# Patient Record
Sex: Female | Born: 1985 | Race: White | Hispanic: No | Marital: Single | Smoking: Current every day smoker
Health system: Southern US, Community
[De-identification: ages and names within clinical notes are randomized; demographics above are authoritative.]

## PROBLEM LIST (undated history)

## (undated) DIAGNOSIS — I38 Endocarditis, valve unspecified: Secondary | ICD-10-CM

## (undated) DIAGNOSIS — J45909 Unspecified asthma, uncomplicated: Secondary | ICD-10-CM

## (undated) DIAGNOSIS — F1110 Opioid abuse, uncomplicated: Secondary | ICD-10-CM

## (undated) DIAGNOSIS — B952 Enterococcus as the cause of diseases classified elsewhere: Secondary | ICD-10-CM

## (undated) DIAGNOSIS — J189 Pneumonia, unspecified organism: Secondary | ICD-10-CM

## (undated) DIAGNOSIS — F1910 Other psychoactive substance abuse, uncomplicated: Secondary | ICD-10-CM

## (undated) DIAGNOSIS — R7881 Bacteremia: Secondary | ICD-10-CM

## (undated) DIAGNOSIS — E871 Hypo-osmolality and hyponatremia: Secondary | ICD-10-CM

## (undated) DIAGNOSIS — D649 Anemia, unspecified: Secondary | ICD-10-CM

## (undated) HISTORY — PX: NO PAST SURGERIES: SHX2092

---
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
## 2007-07-17 ENCOUNTER — Emergency Department (HOSPITAL_COMMUNITY): Admission: EM | Admit: 2007-07-17 | Discharge: 2007-07-17 | Payer: Self-pay | Admitting: Family Medicine

## 2010-03-10 ENCOUNTER — Inpatient Hospital Stay (HOSPITAL_COMMUNITY)
Admission: AD | Admit: 2010-03-10 | Discharge: 2010-03-10 | Payer: Self-pay | Source: Home / Self Care | Admitting: Obstetrics and Gynecology

## 2010-04-03 NOTE — L&D Delivery Note (Signed)
Delivery Note At  a viable  female was delivered via  (Presentation: LOA  ).  Cord was clamped and cut and infant was placed on mother's abdomen.  Cord blood was sampled. APGAR:9/9 , ; weight 6lb 14oz .   Placenta status: intact, .  Good uterine firming with fundal massage and pitocin.  Cord:  with the following complications: .  Cord pH: pending  Anesthesia:  epidural Episiotomy: none Lacerations: 2nd degree posterior Suture Repair: 3.0 monocryl Est. Blood Loss (mL):  Mom to postpartum.  Baby to nursery-stable.  Lindaann Slough MD 11/16/2010, 3:42 AM

## 2010-05-02 ENCOUNTER — Other Ambulatory Visit (HOSPITAL_COMMUNITY): Admission: RE | Admit: 2010-05-02 | Payer: Self-pay | Source: Ambulatory Visit | Admitting: Obstetrics & Gynecology

## 2010-05-02 ENCOUNTER — Ambulatory Visit
Admission: RE | Admit: 2010-05-02 | Discharge: 2010-05-02 | Payer: Self-pay | Source: Home / Self Care | Attending: Obstetrics and Gynecology | Admitting: Obstetrics and Gynecology

## 2010-05-02 ENCOUNTER — Other Ambulatory Visit (HOSPITAL_COMMUNITY)
Admission: RE | Admit: 2010-05-02 | Discharge: 2010-05-02 | Disposition: A | Discharge status: Home / Self Care | Payer: Medicaid Other | Source: Ambulatory Visit | Attending: Obstetrics & Gynecology | Admitting: Obstetrics & Gynecology

## 2010-05-02 DIAGNOSIS — Z113 Encounter for screening for infections with a predominantly sexual mode of transmission: Secondary | ICD-10-CM | POA: Insufficient documentation

## 2010-05-02 DIAGNOSIS — Z01419 Encounter for gynecological examination (general) (routine) without abnormal findings: Secondary | ICD-10-CM | POA: Insufficient documentation

## 2010-05-03 ENCOUNTER — Encounter: Payer: Self-pay | Admitting: Family Medicine

## 2010-05-03 ENCOUNTER — Encounter: Payer: Self-pay | Admitting: Obstetrics and Gynecology

## 2010-05-03 LAB — CONVERTED CEMR LAB
Basophils Absolute: 0 10*3/uL (ref 0.0–0.1)
Eosinophils Absolute: 0.1 10*3/uL (ref 0.0–0.7)
Eosinophils Relative: 1 % (ref 0–5)
HCT: 35.8 % — ABNORMAL LOW (ref 36.0–46.0)
Hepatitis B Surface Ag: NEGATIVE
Lymphocytes Relative: 25 % (ref 12–46)
Lymphs Abs: 2.1 10*3/uL (ref 0.7–4.0)
Neutrophils Relative %: 67 % (ref 43–77)
Platelets: 258 10*3/uL (ref 150–400)
RDW: 14.3 % (ref 11.5–15.5)
Rh Type: POSITIVE
Trich, Wet Prep: NONE SEEN
WBC: 8.3 10*3/uL (ref 4.0–10.5)
Yeast Wet Prep HPF POC: NONE SEEN

## 2010-05-31 ENCOUNTER — Encounter: Payer: Self-pay | Admitting: Obstetrics and Gynecology

## 2010-05-31 DIAGNOSIS — Z3400 Encounter for supervision of normal first pregnancy, unspecified trimester: Secondary | ICD-10-CM

## 2010-06-01 ENCOUNTER — Other Ambulatory Visit: Payer: Self-pay | Admitting: Obstetrics & Gynecology

## 2010-06-01 DIAGNOSIS — Z3689 Encounter for other specified antenatal screening: Secondary | ICD-10-CM

## 2010-06-14 LAB — URINALYSIS, ROUTINE W REFLEX MICROSCOPIC
Glucose, UA: NEGATIVE mg/dL
Hgb urine dipstick: NEGATIVE
pH: 6 (ref 5.0–8.0)

## 2010-06-14 LAB — WET PREP, GENITAL: Trich, Wet Prep: NONE SEEN

## 2010-06-14 LAB — CBC
MCH: 31.6 pg (ref 26.0–34.0)
MCHC: 34.3 g/dL (ref 30.0–36.0)
Platelets: 209 10*3/uL (ref 150–400)
RBC: 4.82 MIL/uL (ref 3.87–5.11)
RDW: 14.1 % (ref 11.5–15.5)

## 2010-06-14 LAB — HCG, QUANTITATIVE, PREGNANCY: hCG, Beta Chain, Quant, S: 11637 m[IU]/mL — ABNORMAL HIGH (ref <5)

## 2010-06-14 LAB — POCT PREGNANCY, URINE: Preg Test, Ur: POSITIVE

## 2010-06-15 ENCOUNTER — Ambulatory Visit (HOSPITAL_COMMUNITY)
Admission: RE | Admit: 2010-06-15 | Discharge: 2010-06-15 | Disposition: A | Discharge status: Home / Self Care | Payer: Medicaid Other | Source: Ambulatory Visit | Attending: Obstetrics & Gynecology | Admitting: Obstetrics & Gynecology

## 2010-06-15 DIAGNOSIS — Z3689 Encounter for other specified antenatal screening: Secondary | ICD-10-CM

## 2010-06-15 DIAGNOSIS — O99330 Smoking (tobacco) complicating pregnancy, unspecified trimester: Secondary | ICD-10-CM | POA: Insufficient documentation

## 2010-06-15 DIAGNOSIS — O358XX0 Maternal care for other (suspected) fetal abnormality and damage, not applicable or unspecified: Secondary | ICD-10-CM | POA: Insufficient documentation

## 2010-06-15 DIAGNOSIS — Z1389 Encounter for screening for other disorder: Secondary | ICD-10-CM | POA: Insufficient documentation

## 2010-06-15 DIAGNOSIS — Z363 Encounter for antenatal screening for malformations: Secondary | ICD-10-CM | POA: Insufficient documentation

## 2010-06-29 ENCOUNTER — Encounter: Payer: Self-pay | Admitting: Obstetrics & Gynecology

## 2010-07-19 ENCOUNTER — Other Ambulatory Visit: Payer: Self-pay | Admitting: Obstetrics and Gynecology

## 2010-07-19 ENCOUNTER — Encounter: Payer: Medicaid Other | Admitting: Obstetrics and Gynecology

## 2010-07-19 DIAGNOSIS — Z09 Encounter for follow-up examination after completed treatment for conditions other than malignant neoplasm: Secondary | ICD-10-CM

## 2010-07-19 DIAGNOSIS — O99330 Smoking (tobacco) complicating pregnancy, unspecified trimester: Secondary | ICD-10-CM

## 2010-07-19 DIAGNOSIS — Z3480 Encounter for supervision of other normal pregnancy, unspecified trimester: Secondary | ICD-10-CM

## 2010-07-22 ENCOUNTER — Ambulatory Visit (HOSPITAL_COMMUNITY): Payer: Medicaid Other

## 2010-07-25 ENCOUNTER — Ambulatory Visit (HOSPITAL_COMMUNITY)
Admission: RE | Admit: 2010-07-25 | Discharge: 2010-07-25 | Disposition: A | Discharge status: Home / Self Care | Payer: Medicaid Other | Source: Ambulatory Visit | Attending: Obstetrics and Gynecology | Admitting: Obstetrics and Gynecology

## 2010-07-25 DIAGNOSIS — O358XX0 Maternal care for other (suspected) fetal abnormality and damage, not applicable or unspecified: Secondary | ICD-10-CM | POA: Insufficient documentation

## 2010-07-25 DIAGNOSIS — Z09 Encounter for follow-up examination after completed treatment for conditions other than malignant neoplasm: Secondary | ICD-10-CM

## 2010-07-25 DIAGNOSIS — Z363 Encounter for antenatal screening for malformations: Secondary | ICD-10-CM | POA: Insufficient documentation

## 2010-07-25 DIAGNOSIS — Z1389 Encounter for screening for other disorder: Secondary | ICD-10-CM | POA: Insufficient documentation

## 2010-08-16 ENCOUNTER — Encounter (INDEPENDENT_AMBULATORY_CARE_PROVIDER_SITE_OTHER): Payer: Medicaid Other | Admitting: Obstetrics & Gynecology

## 2010-08-16 DIAGNOSIS — O99330 Smoking (tobacco) complicating pregnancy, unspecified trimester: Secondary | ICD-10-CM

## 2010-08-16 DIAGNOSIS — Z3400 Encounter for supervision of normal first pregnancy, unspecified trimester: Secondary | ICD-10-CM

## 2010-08-17 ENCOUNTER — Encounter (INDEPENDENT_AMBULATORY_CARE_PROVIDER_SITE_OTHER): Payer: Medicaid Other | Admitting: Obstetrics & Gynecology

## 2010-08-17 DIAGNOSIS — Z3480 Encounter for supervision of other normal pregnancy, unspecified trimester: Secondary | ICD-10-CM

## 2010-09-06 ENCOUNTER — Other Ambulatory Visit: Payer: Self-pay | Admitting: Family Medicine

## 2010-09-06 ENCOUNTER — Encounter (INDEPENDENT_AMBULATORY_CARE_PROVIDER_SITE_OTHER): Payer: Medicaid Other | Admitting: Family Medicine

## 2010-09-06 DIAGNOSIS — Z3400 Encounter for supervision of normal first pregnancy, unspecified trimester: Secondary | ICD-10-CM

## 2010-09-16 ENCOUNTER — Ambulatory Visit (HOSPITAL_COMMUNITY)
Admission: RE | Admit: 2010-09-16 | Discharge: 2010-09-16 | Disposition: A | Discharge status: Home / Self Care | Payer: Medicaid Other | Source: Ambulatory Visit | Attending: Family Medicine | Admitting: Family Medicine

## 2010-09-16 DIAGNOSIS — O365990 Maternal care for other known or suspected poor fetal growth, unspecified trimester, not applicable or unspecified: Secondary | ICD-10-CM | POA: Insufficient documentation

## 2010-09-16 DIAGNOSIS — O99330 Smoking (tobacco) complicating pregnancy, unspecified trimester: Secondary | ICD-10-CM | POA: Insufficient documentation

## 2010-09-20 ENCOUNTER — Encounter (INDEPENDENT_AMBULATORY_CARE_PROVIDER_SITE_OTHER): Payer: Medicaid Other | Admitting: Obstetrics & Gynecology

## 2010-09-20 DIAGNOSIS — O99330 Smoking (tobacco) complicating pregnancy, unspecified trimester: Secondary | ICD-10-CM

## 2010-09-20 DIAGNOSIS — Z3480 Encounter for supervision of other normal pregnancy, unspecified trimester: Secondary | ICD-10-CM

## 2010-10-04 ENCOUNTER — Encounter: Payer: Medicaid Other | Admitting: Obstetrics & Gynecology

## 2010-10-04 ENCOUNTER — Other Ambulatory Visit: Payer: Self-pay | Admitting: Obstetrics & Gynecology

## 2010-10-04 ENCOUNTER — Encounter (INDEPENDENT_AMBULATORY_CARE_PROVIDER_SITE_OTHER): Payer: Medicaid Other | Admitting: Obstetrics & Gynecology

## 2010-10-04 DIAGNOSIS — Z3400 Encounter for supervision of normal first pregnancy, unspecified trimester: Secondary | ICD-10-CM

## 2010-10-10 ENCOUNTER — Ambulatory Visit (HOSPITAL_COMMUNITY)
Admission: RE | Admit: 2010-10-10 | Discharge: 2010-10-10 | Disposition: A | Discharge status: Home / Self Care | Payer: Medicaid Other | Source: Ambulatory Visit | Attending: Obstetrics & Gynecology | Admitting: Obstetrics & Gynecology

## 2010-10-10 DIAGNOSIS — O365990 Maternal care for other known or suspected poor fetal growth, unspecified trimester, not applicable or unspecified: Secondary | ICD-10-CM | POA: Insufficient documentation

## 2010-10-10 DIAGNOSIS — O99330 Smoking (tobacco) complicating pregnancy, unspecified trimester: Secondary | ICD-10-CM | POA: Insufficient documentation

## 2010-10-13 ENCOUNTER — Encounter: Payer: Medicaid Other | Admitting: Obstetrics & Gynecology

## 2010-10-18 ENCOUNTER — Encounter (INDEPENDENT_AMBULATORY_CARE_PROVIDER_SITE_OTHER): Payer: Medicaid Other | Admitting: Family Medicine

## 2010-10-18 ENCOUNTER — Other Ambulatory Visit: Payer: Self-pay | Admitting: Family Medicine

## 2010-10-18 DIAGNOSIS — Z3400 Encounter for supervision of normal first pregnancy, unspecified trimester: Secondary | ICD-10-CM

## 2010-10-24 ENCOUNTER — Encounter (INDEPENDENT_AMBULATORY_CARE_PROVIDER_SITE_OTHER): Payer: Medicaid Other | Admitting: Obstetrics and Gynecology

## 2010-10-24 DIAGNOSIS — Z3480 Encounter for supervision of other normal pregnancy, unspecified trimester: Secondary | ICD-10-CM

## 2010-10-24 DIAGNOSIS — O99330 Smoking (tobacco) complicating pregnancy, unspecified trimester: Secondary | ICD-10-CM

## 2010-10-31 ENCOUNTER — Encounter: Payer: Medicaid Other | Admitting: Obstetrics and Gynecology

## 2010-11-07 ENCOUNTER — Encounter: Payer: Medicaid Other | Admitting: Obstetrics and Gynecology

## 2010-11-08 ENCOUNTER — Other Ambulatory Visit: Payer: Self-pay | Admitting: Obstetrics and Gynecology

## 2010-11-08 ENCOUNTER — Ambulatory Visit (INDEPENDENT_AMBULATORY_CARE_PROVIDER_SITE_OTHER): Payer: Medicaid Other | Admitting: Obstetrics and Gynecology

## 2010-11-08 DIAGNOSIS — O480 Post-term pregnancy: Secondary | ICD-10-CM

## 2010-11-08 DIAGNOSIS — O99330 Smoking (tobacco) complicating pregnancy, unspecified trimester: Secondary | ICD-10-CM

## 2010-11-08 DIAGNOSIS — O2690 Pregnancy related conditions, unspecified, unspecified trimester: Secondary | ICD-10-CM

## 2010-11-08 DIAGNOSIS — Z3480 Encounter for supervision of other normal pregnancy, unspecified trimester: Secondary | ICD-10-CM

## 2010-11-11 ENCOUNTER — Ambulatory Visit (HOSPITAL_COMMUNITY)
Admission: RE | Admit: 2010-11-11 | Discharge: 2010-11-11 | Disposition: A | Discharge status: Home / Self Care | Payer: Medicaid Other | Source: Ambulatory Visit | Attending: Obstetrics and Gynecology | Admitting: Obstetrics and Gynecology

## 2010-11-11 VITALS — BP 110/75 | HR 104 | Wt 145.0 lb

## 2010-11-11 DIAGNOSIS — O99330 Smoking (tobacco) complicating pregnancy, unspecified trimester: Secondary | ICD-10-CM | POA: Insufficient documentation

## 2010-11-11 DIAGNOSIS — O2690 Pregnancy related conditions, unspecified, unspecified trimester: Secondary | ICD-10-CM

## 2010-11-11 DIAGNOSIS — O481 Prolonged pregnancy: Secondary | ICD-10-CM | POA: Insufficient documentation

## 2010-11-11 DIAGNOSIS — O365990 Maternal care for other known or suspected poor fetal growth, unspecified trimester, not applicable or unspecified: Secondary | ICD-10-CM | POA: Insufficient documentation

## 2010-11-11 NOTE — Progress Notes (Signed)
Report in [facility]; follow-up as needed 

## 2010-11-15 ENCOUNTER — Inpatient Hospital Stay (HOSPITAL_COMMUNITY): Payer: Medicaid Other | Admitting: Anesthesiology

## 2010-11-15 ENCOUNTER — Encounter (HOSPITAL_COMMUNITY): Payer: Self-pay | Admitting: Anesthesiology

## 2010-11-15 ENCOUNTER — Encounter (HOSPITAL_COMMUNITY): Payer: Self-pay

## 2010-11-15 ENCOUNTER — Inpatient Hospital Stay (HOSPITAL_COMMUNITY)
Admission: RE | Admit: 2010-11-15 | Discharge: 2010-11-18 | DRG: 775 | Disposition: A | Discharge status: Home / Self Care | Payer: Medicaid Other | Source: Ambulatory Visit | Attending: Family Medicine | Admitting: Family Medicine

## 2010-11-15 VITALS — BP 118/80 | HR 105 | Temp 97.8°F | Resp 18 | Ht 64.5 in | Wt 146.0 lb

## 2010-11-15 DIAGNOSIS — O480 Post-term pregnancy: Principal | ICD-10-CM | POA: Diagnosis present

## 2010-11-15 DIAGNOSIS — O481 Prolonged pregnancy: Secondary | ICD-10-CM

## 2010-11-15 LAB — CBC
HCT: 35.5 % — ABNORMAL LOW (ref 36.0–46.0)
Hemoglobin: 11.7 g/dL — ABNORMAL LOW (ref 12.0–15.0)
MCH: 29.8 pg (ref 26.0–34.0)
MCHC: 33 g/dL (ref 30.0–36.0)
MCV: 90.6 fL (ref 78.0–100.0)
Platelets: 336 K/uL (ref 150–400)
RBC: 3.92 MIL/uL (ref 3.87–5.11)
RDW: 14.7 % (ref 11.5–15.5)
WBC: 18.3 K/uL — ABNORMAL HIGH (ref 4.0–10.5)

## 2010-11-15 MED ORDER — LIDOCAINE HCL (PF) 1 % IJ SOLN
30.0000 mL | INTRAMUSCULAR | Status: AC | PRN
  Administered 2010-11-16: 30 mL via SUBCUTANEOUS
  Filled 2010-11-15: qty 30

## 2010-11-15 MED ORDER — PHENYLEPHRINE 40 MCG/ML (10ML) SYRINGE FOR IV PUSH (FOR BLOOD PRESSURE SUPPORT)
80.0000 ug | PREFILLED_SYRINGE | INTRAVENOUS | Status: DC | PRN
  Filled 2010-11-15 (×2): qty 5

## 2010-11-15 MED ORDER — OXYTOCIN 20 UNITS IN LACTATED RINGERS INFUSION - SIMPLE: 125.0000 mL/h | INTRAVENOUS | Status: AC

## 2010-11-15 MED ORDER — LIDOCAINE HCL 1.5 % IJ SOLN
INTRAMUSCULAR | Status: DC | PRN
  Administered 2010-11-15 (×2): 5 mL via EPIDURAL

## 2010-11-15 MED ORDER — FLEET ENEMA 7-19 GM/118ML RE ENEM: 1.0000 | ENEMA | RECTAL | Status: DC | PRN

## 2010-11-15 MED ORDER — CITRIC ACID-SODIUM CITRATE 334-500 MG/5ML PO SOLN: 30.0000 mL | ORAL | Status: DC | PRN

## 2010-11-15 MED ORDER — TERBUTALINE SULFATE 1 MG/ML IJ SOLN: 0.2500 mg | Freq: Once | INTRAMUSCULAR | Status: DC | PRN

## 2010-11-15 MED ORDER — PHENYLEPHRINE 40 MCG/ML (10ML) SYRINGE FOR IV PUSH (FOR BLOOD PRESSURE SUPPORT)
80.0000 ug | PREFILLED_SYRINGE | INTRAVENOUS | Status: DC | PRN
  Filled 2010-11-15: qty 5

## 2010-11-15 MED ORDER — IBUPROFEN 600 MG PO TABS: 600.0000 mg | ORAL_TABLET | Freq: Four times a day (QID) | ORAL | Status: DC | PRN

## 2010-11-15 MED ORDER — FENTANYL 2.5 MCG/ML BUPIVACAINE 1/10 % EPIDURAL INFUSION (WH - ANES)
14.0000 mL/h | INTRAMUSCULAR | Status: DC
  Administered 2010-11-15 – 2010-11-16 (×5): 14 mL/h via EPIDURAL
  Filled 2010-11-15 (×5): qty 60

## 2010-11-15 MED ORDER — NALBUPHINE SYRINGE 5 MG/0.5 ML
5.0000 mg | INJECTION | INTRAMUSCULAR | Status: DC | PRN
  Administered 2010-11-15 (×2): 5 mg via INTRAVENOUS
  Filled 2010-11-15 (×3): qty 0.5

## 2010-11-15 MED ORDER — EPHEDRINE 5 MG/ML INJ
10.0000 mg | INTRAVENOUS | Status: DC | PRN
  Filled 2010-11-15: qty 4

## 2010-11-15 MED ORDER — OXYCODONE-ACETAMINOPHEN 5-325 MG PO TABS
2.0000 | ORAL_TABLET | ORAL | Status: DC | PRN
  Administered 2010-11-16: 2 via ORAL
  Filled 2010-11-15: qty 2

## 2010-11-15 MED ORDER — ONDANSETRON HCL 4 MG/2ML IJ SOLN
4.0000 mg | Freq: Four times a day (QID) | INTRAMUSCULAR | Status: DC | PRN
  Administered 2010-11-15: 4 mg via INTRAVENOUS
  Filled 2010-11-15: qty 2

## 2010-11-15 MED ORDER — DIPHENHYDRAMINE HCL 50 MG/ML IJ SOLN: 12.5000 mg | INTRAMUSCULAR | Status: DC | PRN

## 2010-11-15 MED ORDER — TERBUTALINE SULFATE 1 MG/ML IJ SOLN: 0.2500 mg | Freq: Once | INTRAMUSCULAR | Status: AC | PRN

## 2010-11-15 MED ORDER — EPHEDRINE 5 MG/ML INJ
10.0000 mg | INTRAVENOUS | Status: DC | PRN
  Filled 2010-11-15 (×2): qty 4

## 2010-11-15 MED ORDER — LACTATED RINGERS IV SOLN
INTRAVENOUS | Status: DC
  Administered 2010-11-15 (×4): via INTRAVENOUS

## 2010-11-15 MED ORDER — LACTATED RINGERS IV SOLN
500.0000 mL | Freq: Once | INTRAVENOUS | Status: AC
  Administered 2010-11-15: 500 mL via INTRAVENOUS

## 2010-11-15 MED ORDER — OXYTOCIN BOLUS FROM INFUSION
500.0000 mL | Freq: Once | INTRAVENOUS | Status: DC
  Filled 2010-11-15: qty 500

## 2010-11-15 MED ORDER — ACETAMINOPHEN 325 MG PO TABS: 650.0000 mg | ORAL_TABLET | ORAL | Status: DC | PRN

## 2010-11-15 MED ORDER — OXYTOCIN 20 UNITS IN LACTATED RINGERS INFUSION - SIMPLE
1.0000 m[IU]/min | INTRAVENOUS | Status: DC
  Administered 2010-11-15: 2 m[IU]/min via INTRAVENOUS
  Filled 2010-11-15: qty 1000

## 2010-11-15 MED ORDER — LACTATED RINGERS IV SOLN
500.0000 mL | INTRAVENOUS | Status: DC | PRN
Start: 2010-11-15 — End: 2010-11-16
  Administered 2010-11-15 (×2): 500 mL via INTRAVENOUS

## 2010-11-15 NOTE — Progress Notes (Signed)
11/15/10 2200  Fetal Heart Rate A  Baseline Rate 105 bpm  Variability 6-25 BPM  Accelerations None  Decelerations Variable;Prolonged   intervention continues

## 2010-11-15 NOTE — Progress Notes (Signed)
Catherine Hernandez is a 25 y.o. G1P0000 at [redacted]w[redacted]d admitted for induction of labor due to post dates.  Subjective: Pt resting with epidural, more pressure  Objective: BP 119/72  Pulse 144  Temp(Src) 98.2 F (36.8 C) (Oral)  Resp 20  Ht 5' 4.5" (1.638 m)  Wt 146 lb (66.225 kg)  BMI 24.67 kg/m2  SpO2 99%  LMP 01/25/2010      FHT:  FHR: 115 bpm, variability: moderate,  accelerations:  Present,  decelerations:  Present variable UC:   regular, every 3-5 minutes SVE:   Dilation: 7.5 Effacement (%): 80 Station: -1 Exam by:: erin hampton, rnc  Labs: Lab Results  Component Value Date   WBC 18.3* 11/15/2010   HGB 11.7* 11/15/2010   HCT 35.5* 11/15/2010   MCV 90.6 11/15/2010   PLT 336 11/15/2010    Assessment / Plan: induction of labor 2/2 post dates  Labor: off pitocin 2/2 FHT, s/p AROM Fetal Wellbeing:  Category II Pain Control:  Epidural I/D:  n/a Anticipated MOD:  NSVD  Lindaann Slough MD 11/15/2010, 11:40 PM

## 2010-11-15 NOTE — Progress Notes (Signed)
Catherine Hernandez is a 25 y.o. G1P0000 at [redacted]w[redacted]d admitted for induction of labor due to Post dates.  Subjective: Pt had not made cervical change for several hours, AROM and IUPC and FSE placed, FHT had prolonged decel, pt changed position, given bolus and O2, pitocin stopped.  Pt reports more pressure  Objective: BP 140/84  Pulse 107  Temp(Src) 98.1 F (36.7 C) (Oral)  Resp 18  Ht 5' 4.5" (1.638 m)  Wt 146 lb (66.225 kg)  BMI 24.67 kg/m2  SpO2 99%  LMP 01/25/2010      FHT:  FHR: 115 bpm, variability: moderate,  accelerations:  Present,  decelerations:  Present variables, prolonged decels UC:   irregular, SVE:   Dilation: 6 Effacement (%): 80 Station: -1 Exam by:: Ansah-Mensah, rnc  Labs: Lab Results  Component Value Date   WBC 18.3* 11/15/2010   HGB 11.7* 11/15/2010   HCT 35.5* 11/15/2010   MCV 90.6 11/15/2010   PLT 336 11/15/2010    Assessment / Plan: Induction of labor for post dates  Labor: holding pitocin for now 2/2 to FHT, s/p AROM Fetal Wellbeing:  Category II Pain Control:  Epidural I/D:  n/a Anticipated MOD:  NSVD  Delton See S. 11/15/2010, 10:05 PM

## 2010-11-15 NOTE — Anesthesia Procedure Notes (Signed)

## 2010-11-15 NOTE — Progress Notes (Signed)
Catherine Hernandez is a 25 y.o. G1P0000 at [redacted]w[redacted]d  admitted for induction of labor due to Post dates.  Subjective: Pt having bradycardia after second dose of Nubain. S/p foley bulb.   Objective: BP 130/78  Pulse 91  Temp(Src) 97.7 F (36.5 C) (Oral)  Resp 18  Ht 5' 4.5" (1.638 m)  Wt 146 lb (66.225 kg)  BMI 24.67 kg/m2  SpO2 98%  LMP 01/25/2010      FHT:  FHR: 100 bpm, variability: moderate,  accelerations:  Present,  decelerations:  Absent UC:   irregular SVE:  5-6/80/-1, sp foley bulb  Labs: Lab Results  Component Value Date   WBC 18.3* 11/15/2010   HGB 11.7* 11/15/2010   HCT 35.5* 11/15/2010   MCV 90.6 11/15/2010   PLT 336 11/15/2010    Assessment / Plan: Induction of labor due to postdates. On Pitocin.  Labor: Progressing on Pitocin, will continue to increase then AROM Fetal Wellbeing:  Category II. Continue monitoring Pain Control:  Epidural I/D:  n/a Anticipated MOD:  NSVD  Makaylen Thieme 11/15/2010, 4:20 PM         Subjective:    Patient ID: Catherine Hernandez, female    DOB: 1986-01-16, 25 y.o.   MRN: [id number]  HPI    Review of Systems     Objective:   Physical Exam        Assessment & Plan:

## 2010-11-15 NOTE — H&P (Signed)
  ALYSHIA KERNAN is a 25 y.o. female G1P0000 with IUP at [redacted]w[redacted]d presenting for induction of labor due to postdates. EDD by [place] is 11/05/2010. She denies any contractions, only having Braxton Hocks which are unchanged. Denies any vaginal bleeding, any loss of fluid, any discharge.  Denies any complications during this pregnancy.  [facility] since 13.3 wks  Prenatal History/Complications: smoker Past Medical History: Past Medical History  Diagnosis Date  . Asthma     AS A CHILD    Past Surgical History: History reviewed. No pertinent past surgical history.  Obstetrical History: OB History    Grav Para Term Preterm Abortions TAB SAB Ect Mult Living   1 0 0 0 0 0 0 0 0 0       Social History: History   Social History  . Marital Status: Single    Spouse Name: N/A    Number of Children: N/A  . Years of Education: N/A   Social History Main Topics  . Smoking status: Current Some Day Smoker -- 0.2 packs/day    Types: Cigarettes  . Smokeless tobacco: None  . Alcohol Use: No  . Drug Use: No  . Sexually Active: Yes   Other Topics Concern  . None   Social History Narrative  . None    Family History: Family History  Problem Relation Age of Onset  . Anesthesia problems Neg Hx   . Hypotension Neg Hx   . Malignant hyperthermia Neg Hx   . Pseudochol deficiency Neg Hx     Allergies: No Known Allergies  Prescriptions prior to admission  Medication Sig Dispense Refill  . prenatal vitamin w/FE, FA (PRENATAL 1 + 1) 27-1 MG TABS Take 1 tablet by mouth daily.        Burnis Medin w/o A-FE-DSS-Methfol-FA (PRENATAL MULTIVITAMIN) 90-600-400 MG-MCG-MCG tablet Take 1 tablet by mouth daily.          Review of Systems - Negative except per HPI   Blood pressure 137/82, pulse 96, temperature 98 F (36.7 C), temperature source Oral, resp. rate 18, height 5' 4.5" (1.638 m), weight 146 lb (66.225 kg), last menstrual period 01/25/2010. General appearance: alert, cooperative and no  distress Lungs: clear to auscultation bilaterally Heart: regular rate and rhythm, S1, S2 normal, no murmur, click, rub or gallop Abdomen: gravid Extremities: extremities normal, atraumatic, no cyanosis or edema cephalic Baseline: 120 bpm, Variability: Good {> 6 bpm), Accelerations: Reactive and Decelerations: Absent Contractions: infrequent and irregular Dilation: 1.5 Effacement (%): 60 Station: -2 Exam by:: dr Natale Milch   Prenatal labs: ABO, Rh: O/POS/-- (01/31 2130) Antibody: NEG (01/31 2130) Rubella:  immune RPR: NON REAC (01/31 2130)  HBsAg: NEGATIVE (01/31 2130)  HIV: NON REACTIVE (01/31 2130)  GBS: NEGATIVE (07/17 1125)  1 hr Glucola: 115 Genetic screening: declined Anatomy US on 10/10/10: EFW: 5lb8oz, AC and FL smaller than HC/BPD, AFI: 18.79   Assessment: LUKE RIGSBEE is a 25 y.o. G1P0000 with an IUP at [redacted]w[redacted]d presenting for IOL due to postdates  Plan: 1. Induction: Bishop: 5, insert foley and reevaluate. 2. Pain management: will want epidural 3. Expectant management 4. GBS neg    Yocelin Vanlue 11/15/2010, 9:28 AM

## 2010-11-15 NOTE — Anesthesia Preprocedure Evaluation (Signed)
Anesthesia Evaluation  Name, MR# and DOB Patient awake  General Assessment Comment  Reviewed: Allergy & Precautions, H&P , Patient's Chart, lab work & pertinent test results  Airway Mallampati: II TM Distance: >3 FB Neck ROM: full    Dental No notable dental hx.    Pulmonary  asthma  clear to auscultation  pulmonary exam normalPulmonary Exam Normal breath sounds clear to auscultation none    Cardiovascular regular Normal    Neuro/Psych Negative Neurological ROS  Negative Psych ROS  GI/Hepatic/Renal negative GI ROS, negative Liver ROS, and negative Renal ROS (+)       Endo/Other  Negative Endocrine ROS (+)      Abdominal   Musculoskeletal   Hematology negative hematology ROS (+)   Peds  Reproductive/Obstetrics (+) Pregnancy    Anesthesia Other Findings             Anesthesia Physical Anesthesia Plan  ASA: II  Anesthesia Plan: Epidural   Post-op Pain Management:    Induction:   Airway Management Planned:   Additional Equipment:   Intra-op Plan:   Post-operative Plan:   Informed Consent: I have reviewed the patients History and Physical, chart, labs and discussed the procedure including the risks, benefits and alternatives for the proposed anesthesia with the patient or authorized representative who has indicated his/her understanding and acceptance.     Plan Discussed with:   Anesthesia Plan Comments:         Anesthesia Quick Evaluation

## 2010-11-15 NOTE — Plan of Care (Signed)
Problem: Consults Goal: Birthing Suites Patient Information Press F2 to bring up selections list  Pt > [redacted] weeks EGA and Inpatient induction        

## 2010-11-15 NOTE — Progress Notes (Signed)
Catherine Hernandez is a 25 y.o. G1P0000 at 13w3dadmitted for induction of labor due to Post dates. Due date 11/05/10.  Subjective: Feeling comfortable s/p nubain. Foley still in place  Objective: BP 135/77  Pulse 102  Temp(Src) 97.7 F (36.5 C) (Oral)  Resp 17  Ht 5' 4.5" (1.638 m)  Wt 146 lb (66.225 kg)  BMI 24.67 kg/m2  LMP 01/25/2010      FHT:  FHR: 110, moderate variability, present accels, no decels UC:   irregular, every 5-8  minutes SVE:   Dilation: 2.5 (foley bulb intact in cervix) Effacement (%): 60 Station: -2 Exam by:: dr Catherine Hernandez  Labs: Lab Results  Component Value Date   WBC 18.3* 11/15/2010   HGB 11.7* 11/15/2010   HCT 35.5* 11/15/2010   MCV 90.6 11/15/2010   PLT 336 11/15/2010    Assessment / Plan: 25yo G1 at 41.3 for IOL due to postdates. Foley still in place. Start pitocin  Labor: start pitocin Fetal Wellbeing:  Category II, low FHR s/p Nubain. Continue monitoring Pain Control:  Labor support without medications I/D:  na Anticipated MOD:  NSVD  Catherine Hernandez 11/15/2010, 2:11 PM         Subjective:    Patient ID: Catherine Hernandez, female    DOB: 22-Jul-1985, 25 y.o.   MRN: [id number]  HPI    Review of Systems     Objective:   Physical Exam        Assessment & Plan:

## 2010-11-16 ENCOUNTER — Encounter (HOSPITAL_COMMUNITY): Payer: Self-pay

## 2010-11-16 MED ORDER — DIPHENHYDRAMINE HCL 25 MG PO CAPS: 25.0000 mg | ORAL_CAPSULE | Freq: Four times a day (QID) | ORAL | Status: DC | PRN

## 2010-11-16 MED ORDER — TETANUS-DIPHTH-ACELL PERTUSSIS 5-2.5-18.5 LF-MCG/0.5 IM SUSP
0.5000 mL | Freq: Once | INTRAMUSCULAR | Status: AC
  Administered 2010-11-17: 0.5 mL via INTRAMUSCULAR
  Filled 2010-11-16: qty 0.5

## 2010-11-16 MED ORDER — BENZOCAINE-MENTHOL 20-0.5 % EX AERO
INHALATION_SPRAY | CUTANEOUS | Status: AC
  Administered 2010-11-16: 1 via TOPICAL
  Filled 2010-11-16: qty 56

## 2010-11-16 MED ORDER — ZOLPIDEM TARTRATE 5 MG PO TABS: 5.0000 mg | ORAL_TABLET | Freq: Every evening | ORAL | Status: DC | PRN

## 2010-11-16 MED ORDER — SENNOSIDES-DOCUSATE SODIUM 8.6-50 MG PO TABS
2.0000 | ORAL_TABLET | Freq: Every day | ORAL | Status: DC
  Administered 2010-11-16 – 2010-11-17 (×2): 2 via ORAL

## 2010-11-16 MED ORDER — IBUPROFEN 600 MG PO TABS
600.0000 mg | ORAL_TABLET | Freq: Four times a day (QID) | ORAL | Status: DC
  Administered 2010-11-16 – 2010-11-18 (×9): 600 mg via ORAL
  Filled 2010-11-16 (×9): qty 1

## 2010-11-16 MED ORDER — BENZOCAINE-MENTHOL 20-0.5 % EX AERO
1.0000 "application " | INHALATION_SPRAY | CUTANEOUS | Status: DC | PRN
  Administered 2010-11-16: 1 via TOPICAL

## 2010-11-16 MED ORDER — WITCH HAZEL-GLYCERIN EX PADS: 1.0000 "application " | MEDICATED_PAD | CUTANEOUS | Status: DC | PRN

## 2010-11-16 MED ORDER — DIBUCAINE 1 % RE OINT: 1.0000 "application " | TOPICAL_OINTMENT | RECTAL | Status: DC | PRN

## 2010-11-16 MED ORDER — PRENATAL PLUS 27-1 MG PO TABS
1.0000 | ORAL_TABLET | Freq: Every day | ORAL | Status: DC
  Administered 2010-11-16 – 2010-11-18 (×3): 1 via ORAL
  Filled 2010-11-16 (×3): qty 1

## 2010-11-16 MED ORDER — LANOLIN HYDROUS EX OINT: TOPICAL_OINTMENT | CUTANEOUS | Status: DC | PRN

## 2010-11-16 MED ORDER — ONDANSETRON HCL 4 MG/2ML IJ SOLN: 4.0000 mg | INTRAMUSCULAR | Status: DC | PRN

## 2010-11-16 MED ORDER — OXYCODONE-ACETAMINOPHEN 5-325 MG PO TABS
1.0000 | ORAL_TABLET | ORAL | Status: DC | PRN
  Administered 2010-11-16 – 2010-11-17 (×7): 2 via ORAL
  Administered 2010-11-18: 1 via ORAL
  Administered 2010-11-18 (×2): 2 via ORAL
  Filled 2010-11-16 (×10): qty 2

## 2010-11-16 MED ORDER — SIMETHICONE 80 MG PO CHEW: 80.0000 mg | CHEWABLE_TABLET | ORAL | Status: DC | PRN

## 2010-11-16 MED ORDER — ONDANSETRON HCL 4 MG PO TABS: 4.0000 mg | ORAL_TABLET | ORAL | Status: DC | PRN

## 2010-11-16 NOTE — Anesthesia Postprocedure Evaluation (Signed)
  Anesthesia Post Note  Patient: Catherine Hernandez  Procedure(s) Performed: * No procedures listed *  Anesthesia type: Epidural  Patient location: Mother/Baby  Post pain: Pain level controlled  Post assessment: Post-op Vital signs reviewed  Last Vitals:  Filed Vitals:   11/16/10 1536  BP: 105/66  Pulse: 97  Temp: 98.1 F (36.7 C)  Resp: 18    Post vital signs: Reviewed  Level of consciousness: awake  Complications: No apparent anesthesia complications

## 2010-11-16 NOTE — Progress Notes (Signed)
UR chart review completed.  

## 2010-11-16 NOTE — Progress Notes (Signed)
11/16/10 0055  Fetal Heart Rate A  Baseline Rate 95 bpm  Variability 6-25 BPM   intervention continues

## 2010-11-17 DIAGNOSIS — O480 Post-term pregnancy: Secondary | ICD-10-CM

## 2010-11-17 MED ORDER — OXYCODONE-ACETAMINOPHEN 5-325 MG PO TABS: 1.0000 | ORAL_TABLET | ORAL | Status: AC | PRN

## 2010-11-17 MED ORDER — IBUPROFEN 600 MG PO TABS: 600.0000 mg | ORAL_TABLET | Freq: Four times a day (QID) | ORAL | Status: AC

## 2010-11-17 MED ORDER — DOCUSATE SODIUM 100 MG PO CAPS: 100.0000 mg | ORAL_CAPSULE | Freq: Two times a day (BID) | ORAL | Status: AC

## 2010-11-17 NOTE — Discharge Summary (Signed)
  Obstetric Discharge Summary Reason for Admission: induction of labor Prenatal Procedures: ultrasound Intrapartum Procedures: spontaneous vaginal delivery Postpartum Procedures: none Complications-Operative and Postpartum: 2nd degree perineal laceration Hemoglobin  Date Value Range Status  11/15/2010 11.7* 12.0-15.0 (g/dL) Final     HCT  Date Value Range Status  11/15/2010 35.5* 36.0-46.0 (%) Final    Discharge Diagnoses: Term Pregnancy-delivered  Discharge Information: Date: 11/17/2010 Activity: pelvic rest Diet: routine Medications: Ibuprophen, Colace and Percocet Condition: stable Instructions: refer to practice specific booklet Discharge to: home Follow-up Information    Follow up with [facility]. Make an appointment in 6 weeks. (postpartum visit)    Contact information:   [address] [place] [address]          Newborn Data: Live born female  Birth Weight: 6 lb 14.8 oz (3141 g) APGAR: 9, 9  Home with mother.  Lindaann Slough MD 11/17/2010, 5:42 AM

## 2010-11-17 NOTE — Progress Notes (Signed)
Post Partum Day 1 Subjective: no complaints, up ad lib, voiding and tolerating PO  Objective: Blood pressure 110/74, pulse 102, temperature 98.3 F (36.8 C), temperature source Oral, resp. rate 18, height 5' 4.5" (1.638 m), weight 146 lb (66.225 kg), last menstrual period 01/25/2010, SpO2 99.00%, unknown if currently breastfeeding.  Physical Exam:  General: alert and no distress Lochia: appropriate Uterine Fundus: firm DVT Evaluation: No evidence of DVT seen on physical exam. Negative Homan's sign.   Basename 11/15/10 0755  HGB 11.7*  HCT 35.5*    Assessment/Plan: Discharge home and Contraception undecided bottle feeding   LOS: 2 days   Lindaann Slough. MD 11/17/2010, 5:39 AM

## 2010-11-18 NOTE — Progress Notes (Signed)
Post Partum Day 2 Subjective: tolerating PO and + flatus, normal lochia, absent BM, present flatus, plans to bottle feed, undecided pp contraception.  Objective: Blood pressure 118/80, pulse 105, temperature 97.8 F (36.6 C), temperature source Oral, resp. rate 18, height 5' 4.5" (1.638 m), weight 66.225 kg (146 lb), last menstrual period 01/25/2010, SpO2 99.00%, unknown if currently breastfeeding.  Physical Exam:  General: alert and cooperative Lochia: appropriate Chest: CTAB Heart: RRR no m/r/g Abdomen: +BS, soft, nontender,  Uterine Fundus: FF @ umbilicus -1 DVT Evaluation: No evidence of DVT seen on physical exam. Extremities: no c/c/e    [facility] 11/15/10 0755  HGB 11.7*  HCT 35.5*    Assessment/Plan: Discharge home   LOS: 3 days   Catherine Hernandez 11/18/2010, 7:28 AM

## 2010-12-28 ENCOUNTER — Encounter: Payer: Self-pay | Admitting: Obstetrics and Gynecology

## 2010-12-28 ENCOUNTER — Ambulatory Visit (INDEPENDENT_AMBULATORY_CARE_PROVIDER_SITE_OTHER): Payer: Medicaid Other | Admitting: Obstetrics and Gynecology

## 2010-12-28 VITALS — BP 109/82 | HR 94 | Wt 128.0 lb

## 2010-12-28 DIAGNOSIS — Z3049 Encounter for surveillance of other contraceptives: Secondary | ICD-10-CM

## 2010-12-28 DIAGNOSIS — IMO0001 Reserved for inherently not codable concepts without codable children: Secondary | ICD-10-CM

## 2010-12-28 MED ORDER — MEDROXYPROGESTERONE ACETATE 150 MG/ML IM SUSP
150.0000 mg | Freq: Once | INTRAMUSCULAR | Status: AC
  Administered 2010-12-28: 150 mg via INTRAMUSCULAR

## 2010-12-28 NOTE — Patient Instructions (Signed)
What is this medicine? MEDROXYPROGESTERONE (me DROX ee proe JES te rone) contraceptive injections prevent pregnancy. They provide effective birth control for 3 months. Depo-subQ Provera 104 is also used for treating pain related to endometriosis. This medicine may be used for other purposes; ask your health care provider or pharmacist if you have questions.   What should I tell my health care provider before I take this medicine? They need to know if you have any of these conditions: -frequently drink alcohol -asthma -blood vessel disease or a history of a blood clot in the lungs or legs -bone disease such as osteoporosis -breast cancer -diabetes -eating disorder (anorexia nervosa or bulimia) -high blood pressure -HIV infection or AIDS -kidney disease -liver disease -mental depression -migraine -seizures (convulsions) -stroke -tobacco smoker -vaginal bleeding -an unusual or allergic reaction to medroxyprogesterone, other hormones, medicines, foods, dyes, or preservatives -pregnant or trying to get pregnant -breast-feeding   How should I use this medicine? Depo-Provera Contraceptive injection is given into a muscle. Depo-subQ Provera 104 injection is given under the skin. These injections are given by a health care professional. You must not be pregnant before getting an injection. The injection is usually given during the first 5 days after the start of a menstrual period or 6 weeks after delivery of a baby. Talk to your pediatrician regarding the use of this medicine in children. Special care may be needed. These injections have been used in female children who have started having menstrual periods. Overdosage: If you think you have taken too much of this medicine contact a poison control center or emergency room at once. NOTE: This medicine is only for you. Do not share this medicine with others.   What if I miss a dose? Try not to miss a dose. You must get an injection once  every 3 months to maintain birth control. If you cannot keep an appointment, call and reschedule it. If you wait longer than 13 weeks between Depo-Provera contraceptive injections or longer than 14 weeks between Depo-subQ Provera 104 injections, you could get pregnant. Use another method for birth control if you miss your appointment. You may also need a pregnancy test before receiving another injection.   What may interact with this medicine? Do not take this medicine with any of the following medications: -bosentan   This medicine may also interact with the following medications: -aminoglutethimide -antibiotics or medicines for infections, especially rifampin, rifabutin, rifapentine, and griseofulvin -aprepitant -barbiturate medicines such as phenobarbital or primidone -bexarotene -carbamazepine -medicines for seizures like ethotoin, felbamate, oxcarbazepine, phenytoin, topiramate -modafinil -St. John's wort   This list may not describe all possible interactions. Give your health care provider a list of all the medicines, herbs, non-prescription drugs, or dietary supplements you use. Also tell them if you smoke, drink alcohol, or use illegal drugs. Some items may interact with your medicine.   What should I watch for while using this medicine? This drug does not protect you against HIV infection (AIDS) or other sexually transmitted diseases.   Use of this product may cause you to lose calcium from your bones. Loss of calcium may cause weak bones (osteoporosis). Only use this product for more than 2 years if other forms of birth control are not right for you. The longer you use this product for birth control the more likely you will be at risk for weak bones. Ask your health care professional how you can keep strong bones.   You may have a change in bleeding pattern  or irregular periods. Many females stop having periods while taking this drug.   If you have received your injections on  time, your chance of being pregnant is very low. If you think you may be pregnant, see your health care professional as soon as possible.   Tell your health care professional if you want to get pregnant within the next year. The effect of this medicine may last a long time after you get your last injection.   What side effects may I notice from receiving this medicine? Side effects that you should report to your doctor or health care professional as soon as possible: -allergic reactions like skin rash, itching or hives, swelling of the face, lips, or tongue -breast tenderness or discharge -breathing problems -changes in vision -depression -feeling faint or lightheaded, falls -fever -pain in the abdomen, chest, groin, or leg -problems with balance, talking, walking -unusually weak or tired -yellowing of the eyes or skin   Side effects that usually do not require medical attention (report to your doctor or health care professional if they continue or are bothersome): -acne -fluid retention and swelling -headache -irregular periods, spotting, or absent periods -temporary pain, itching, or skin reaction at site where injected -weight gain   This list may not describe all possible side effects. Call your doctor for medical advice about side effects. You may report side effects to FDA at 1-800-FDA-1088.   Where should I keep my medicine? This does not apply. The injection will be given to you by a health care professional.   NOTE:This sheet is a summary. It may not cover all possible information. If you have questions about this medicine, talk to your doctor, pharmacist, or health care provider.      2011, [employer] Standard.

## 2010-12-28 NOTE — Progress Notes (Signed)
  Subjective:    Patient ID: Catherine Hernandez, female    DOB: 1985-06-03, 25 y.o.   MRN: [id number]  HPI 25 yo G1P1 s/p SVD on 8/15 presenting today for postpartum check. Patient is doing well. Patient reports receiving ample help with infant and denies si/sx of postpartum depression. Patient is bottle feeding. She has started her menses today and has not had any sexual activity.   Review of Systems     Objective:   Physical Exam  GENERAL: Well-developed, well-nourished female in no acute distress.  HEENT: Normocephalic, atraumatic. Sclerae anicteric.  NECK: Supple. Normal thyroid.  LUNGS: Clear to auscultation bilaterally.  HEART: Regular rate and rhythm. BREASTS: Symmetric in size. No palpable masses or lymphadenopathy, skin changes, or nipple drainage. No engorgement ABDOMEN: Soft, nontender, nondistended. No organomegaly. PELVIC: Normal external female genitalia. Vagina is pink and rugated.  Normal discharge. Normal appearing cervix. Uterus is normal in size. No adnexal mass or tenderness. EXTREMITIES: No cyanosis, clubbing, or edema, 2+ distal pulses.       Assessment & Plan:  25 yo G1P1 her for postpartum check s/p SVD 8/15 - Patient medically cleared to resume all activities of daily living - patient due for pap smear in January - Birth control options reviewed and patient interested in Depo-Provera which we will start today

## 2010-12-28 NOTE — Progress Notes (Signed)
Addended by: Barbara Cower on: 12/28/2010 11:40 AM   Modules accepted: Orders

## 2011-11-04 ENCOUNTER — Encounter (HOSPITAL_COMMUNITY): Payer: Self-pay | Admitting: *Deleted

## 2011-11-04 ENCOUNTER — Emergency Department (HOSPITAL_COMMUNITY)
Admission: EM | Admit: 2011-11-04 | Discharge: 2011-11-05 | Disposition: A | Discharge status: Home / Self Care | Payer: Self-pay | Attending: Emergency Medicine | Admitting: Emergency Medicine

## 2011-11-04 DIAGNOSIS — M26629 Arthralgia of temporomandibular joint, unspecified side: Secondary | ICD-10-CM | POA: Insufficient documentation

## 2011-11-04 NOTE — ED Notes (Signed)
The pt has had lt jaw lt ear and lt neck pain for 2-3 days

## 2011-11-05 MED ORDER — KETOROLAC TROMETHAMINE 30 MG/ML IJ SOLN
30.0000 mg | Freq: Once | INTRAMUSCULAR | Status: AC
  Administered 2011-11-05: 30 mg via INTRAMUSCULAR

## 2011-11-05 MED ORDER — NAPROXEN 500 MG PO TABS: 500.0000 mg | ORAL_TABLET | Freq: Two times a day (BID) | ORAL | Status: DC

## 2011-11-05 MED ORDER — KETOROLAC TROMETHAMINE 30 MG/ML IJ SOLN
INTRAMUSCULAR | Status: AC
  Administered 2011-11-05: 30 mg via INTRAMUSCULAR
  Filled 2011-11-05: qty 1

## 2011-11-05 NOTE — ED Notes (Signed)
The patient is AOx4 and comfortable with her discharge instructions. 

## 2011-11-05 NOTE — Discharge Instructions (Signed)
You were seen and evaluated for your left jaw and face pains. At this time your providers feel you have pain and inflammation of your temporomandibular joint (TMJ).  You have been given prescriptions for pain and anti-inflammatory medication. Please take this and followup with a dentist on Monday.   Temporomandibular Joint Pain Your exam shows that you have a problem with your temporomandibular joint (TMJ), the joint that moves when you open your mouth or chew food. TMJ problems can result from direct injuries, bite abnormalities, or tension states which cause you to grind or clench your teeth. Typical symptoms include pain around the joint, clicking, restricted movement, and headaches. The TMJ is like any other joint in the body; when it is strained, it needs rest to repair itself. To keep the joint at rest it is important that you do not open your mouth wider than the width of your index finger. If you must yawn, be sure to support your chin with your hand so your mouth does not open wide. Eat a soft diet (nothing firmer than ground beef, no raw vegetables), do not chew gum and do not talk if it causes you pain. Apply topical heat by using a warm, moist cloth placed in front of the ear for 15 to 20 minutes several times daily. Alternating heat and ice may give even more relief. Anti-inflammatory pain medicine and muscle relaxants can also be helpful. A dental orthotic or splint may be used for temporary relief. Long-term problems may require treatment for stress as well as braces or surgery. Please check with your doctor or dentist if your symptoms do not improve within one week. Document Released: 04/27/2004 Document Revised: 03/09/2011 Document Reviewed: 03/20/2005 [facility] Patient Information 2012 [place], [place].     Dental Care and Dentist Visits Dental care supports good overall health. Regular dental visits can also help you avoid dental pain, bleeding, infection, and other more serious health  problems in the future. It is important to keep the mouth healthy because diseases in the teeth, gums, and other oral tissues can spread to other areas of the body. Some problems, such as diabetes, heart disease, and pre-term labor have been associated with poor oral health.  See your dentist every 6 months. If you experience emergency problems such as a toothache or broken tooth, go to the dentist right away. If you see your dentist regularly, you may catch problems early. It is easier to be treated for problems in the early stages.  WHAT TO EXPECT AT A DENTIST VISIT  Your dentist will look for many common oral health problems and recommend proper treatment. At your regular dental visit, you can expect:  Gentle cleaning of the teeth and gums. This includes scraping and polishing. This helps to remove the sticky substance around the teeth and gums (plaque). Plaque forms in the mouth shortly after eating. Over time, plaque hardens on the teeth as tartar. If tartar is not removed regularly, it can cause problems. Cleaning also helps remove stains.   Periodic X-rays. These pictures of the teeth and supporting bone will help your dentist assess the health of your teeth.   Periodic fluoride treatments. Fluoride is a natural mineral shown to help strengthen teeth. Fluoride treatmentinvolves applying a fluoride gel or varnish to the teeth. It is most commonly done in children.   Examination of the mouth, tongue, jaws, teeth, and gums to look for any oral health problems, such as:   Cavities (dental caries). This is decay on the  tooth caused by plaque, sugar, and acid in the mouth. It is best to catch a cavity when it is small.   Inflammation of the gums caused by plaque buildup (gingivitis).   Problems with the mouth or malformed or misaligned teeth.   Oral cancer or other diseases of the soft tissues or jaws.  KEEP YOUR TEETH AND GUMS HEALTHY For healthy teeth and gums, follow these general  guidelines as well as your dentist's specific advice:  Have your teeth professionally cleaned at the dentist every 6 months.   Brush twice daily with a fluoride toothpaste.   Floss your teeth daily.   Ask your dentist if you need fluoride supplements, treatments, or fluoride toothpaste.   Eat a healthy diet. Reduce foods and drinks with added sugar.   Avoid smoking.  TREATMENT FOR ORAL HEALTH PROBLEMS If you have oral health problems, treatment varies depending on the conditions present in your teeth and gums.  Your caregiver will most likely recommend good oral hygiene at each visit.   For cavities, gingivitis, or other oral health disease, your caregiver will perform a procedure to treat the problem. This is typically done at a separate appointment. Sometimes your caregiver will refer you to another dental specialist for specific tooth problems or for surgery.  SEEK IMMEDIATE DENTAL CARE IF:  You have pain, bleeding, or soreness in the gum, tooth, jaw, or mouth area.   A permanent tooth becomes loose or separated from the gum socket.   You experience a blow or injury to the mouth or jaw area.  Document Released: 11/30/2010 Document Revised: 03/09/2011 Document Reviewed: 11/30/2010 [facility] Patient Information 2012 [place], [place].     RESOURCE GUIDE  Chronic Pain Problems: Contact [facility]  [phone] Patients need to be referred by their primary care doctor.  Insufficient Money for Medicine: Contact United Way:  call "211" or [facility] [phone].  No Primary Care Doctor: - Call Health Connect  [phone] - can help you locate a primary care doctor that  accepts your insurance, provides certain services, etc. - Physician Referral [phone]  Agencies that provide inexpensive medical care: - [facility]  [phone] - [facility]  [phone] - [facility]  [phone] [facility]  Clinic  [phone] - [facility]  [phone] [facility]  [phone]  Medicaid-accepting [facility] Providers: - [facility]- [address]  [phone], Mon-Fri 9am-7pm, Sat 9am-1pm - [facility]- [address] [place], [address] [place]  [phone] - [facility]- [address] [place]  [phone] [facility]- [address]  [phone] - Renaye Rakers- [address] [place], [address], [phone]  Only accepts [place] patients after they have their name  applied to their card  Self Pay (no insurance) in [place]: - Sickle Cell Patients: Dr Willey Blade, [facility]  [address] [place], [phone] - [facility]- [address] [place]  [phone]       [facility] [place]- [address]       -     [facility]- see information above (Speak to [employer] if you do not have insurance)       -  [facility]- [address] [place], [phone]       -  [facility]- [address],  [phone]       -  [facility]- [address] [phone]       -  Dr Julio Sicks-  [address], [place], [phone]       -  [facility]- [address] [phone]       -  [facility]- [address] [phone], also [address], [phone]       -    [facility]- [address] [place], [phone], 1st & 3rd Saturday   every month, 10am-1pm  1) Find a Doctor and Pay Out of Pocket Although you won't have to find out who is covered by your insurance plan, it is a good idea to ask around and get recommendations. You will then need to call the office and see if the doctor you have chosen will accept you as a new patient and what types of options they offer for patients who are self-pay. Some doctors offer discounts or will set up payment plans for their  patients who do not have insurance, but you will need to ask so you aren't surprised when you get to your appointment.  2) Contact Your Local Health Department Not all health departments have doctors that can see patients for sick visits, but many do, so it is worth a call to see if yours does. If you don't know where your local health department is, you can check in your phone book. The CDC also has a tool to help you locate your state's health department, and many state websites also have listings of all of their local health departments.  3) Find a Walk-in Clinic If your illness is not likely to be very severe or complicated, you may want to try a walk in clinic. These are popping up all over the country in pharmacies, drugstores, and shopping centers. They're usually staffed by nurse practitioners or physician assistants that have been trained to treat common illnesses and complaints. They're usually fairly quick and inexpensive. However, if you have serious medical issues or chronic medical problems, these are probably not your best option  STD Testing - [facility] Department of [facility] [place], STD Clinic, [address], [place], phone [phone] or [phone].  Monday - Friday, call for an appointment. [facility] Department of [employer], STD Clinic, [address], [place], phone [phone] or [phone].  Monday - Friday, call for an appointment.  Abuse/Neglect: [facility] Child Abuse Hotline [phone] [facility] Child Abuse Hotline [phone] (After Hours)  Emergency Shelter:  [facility] (701) 812-4496  Maternity Homes: - [facility] [place] [facility] (343)111-3683 - [facility] [phone]  MRSA Hotline #:   201-220-7540  [facility] Resources  [facility] [place] [facility]  United Way [facility] Dept. [address]                 [address]          [address] [place] [address]  [address]  [facility] Phone:  [phone]                                  Phone:  [phone]                   Phone:  [phone]  [facility], [phone] - [phone]       -     [facility] in [place], [address]                                  [phone], [facility] Child Abuse Hotline 225-265-2654 or 248-163-2610 (After Hours)   Behavioral Health Services  Substance Abuse Resources: - [facility] Drug [facility]  316-411-6279 - [facility] (919)076-6674 - [facility] [phone] Floydene Flock (423)[phone] - Residential & Outpatient Substance Abuse Program  [phone]  Psychological Services: [facility]  [phone] [facility] Services  [phone] - [facility], [phone] [place]. [address], [place], ACCESS LINE: [phone] or ([phone], [employer]  Dental Assistance  If unable to pay or uninsured, contact:  [facility] or [facility]. to become qualified for the adult dental clinic.  Patients with Medicaid: [facility] [phone] [address], [phone] [address] [phone]  If unable to pay, or uninsured, contact [facility] ([phone]) or [facility] Department [phone] in [place], [phone] in [facility]) to become qualified for the adult dental clinic  Other Low-Cost Community Dental Services: - [facility]- [address] [place], [place], [place], [address], [phone], Ext. [phone], 2nd and 4th Thursday of the month at 6:30am.  10 clients each day by appointment, can sometimes see walk-in patients if someone does not show for an appointment. [facility]- [address] [place]  [place], [place], [address], [phone] - [facility]- [address], [place], [place], [address], [phone] - [facility]- [phone] [facility] Department- [phone] [facility] Department- [phone]

## 2011-11-05 NOTE — ED Provider Notes (Signed)
History     CSN: [id number]  Arrival date & time 11/04/11  2235   First MD Initiated Contact with Patient 11/05/11 0025      Chief Complaint  Patient presents with  . Jaw Pain    HPI  History provided by the patient. Patient is a 26 year old female with no significant PMH who presents with complaints of left jaw pain for the past 2-3 days. Pain radiates into the left ear and neck area. Pain is worse with movements of the jaw when chewing on that side. Patient states she has slight left lower will or tenderness but denies significant dental pains. She denies any swelling of the gums or face. She denies any sore throat, fever, chills or sweats. Patient does report having history of popping in the right side of TMJ. She denies having similar symptoms previously. Patient has not taken any medications for symptoms.   Past Medical History  Diagnosis Date  . Asthma     AS A CHILD    History reviewed. No pertinent past surgical history.  Family History  Problem Relation Age of Onset  . Anesthesia problems Neg Hx   . Hypotension Neg Hx   . Malignant hyperthermia Neg Hx   . Pseudochol deficiency Neg Hx     History  Substance Use Topics  . Smoking status: Current Some Day Smoker -- 0.2 packs/day    Types: Cigarettes  . Smokeless tobacco: Not on file  . Alcohol Use: No    OB History    Grav Para Term Preterm Abortions TAB SAB Ect Mult Living   1 1 1  0 0 0 0 0 0 1      Review of Systems  Constitutional: Negative for fever and chills.  HENT: Negative for sore throat, facial swelling, trouble swallowing, dental problem and voice change.   Respiratory: Negative for cough.   Gastrointestinal: Negative for nausea, vomiting and abdominal pain.    Allergies  Review of patient's allergies indicates no known allergies.  Home Medications  No current outpatient prescriptions on file.  BP 124/84  Pulse 92  Temp 98.5 F (36.9 C) (Oral)  Resp 18  SpO2 100%  LMP  10/08/2011  Physical Exam  Nursing note and vitals reviewed. Constitutional: She is oriented to person, place, and time. She appears well-developed and well-nourished. No distress.  HENT:  Head: Normocephalic and atraumatic.  Right Ear: Tympanic membrane normal.  Left Ear: Tympanic membrane normal.  Mouth/Throat: Oropharynx is clear and moist.       There is dental decay of the left lower third molar. Very mild tenderness with palpation. No swelling of the gums. There is pain over palpation of the left TMJ. No popping or clicking. Ears appear normal.  Neck: Normal range of motion. Neck supple.  Cardiovascular: Normal rate and regular rhythm.   Pulmonary/Chest: Effort normal and breath sounds normal.  Lymphadenopathy:    She has no cervical adenopathy.  Neurological: She is alert and oriented to person, place, and time.  Skin: Skin is warm and dry.  Psychiatric: She has a normal mood and affect. Her behavior is normal.    ED Course  Procedures      1. TMJ tenderness       MDM  12:30AM patient seen and evaluated. Patient does have dental decay and some tenderness over left molar. There is also tenderness over left TMJ. Will get dental referral. Plan to treat with NSAID and soft diet.  Angus Seller, PA 11/05/11 0110

## 2011-11-05 NOTE — ED Provider Notes (Signed)
Medical screening examination/treatment/procedure(s) were performed by non-physician practitioner and as supervising physician I was immediately available for consultation/collaboration.   Shalaunda Weatherholtz Y. Peyton Spengler, MD 11/05/11 0645 

## 2012-01-07 ENCOUNTER — Inpatient Hospital Stay (HOSPITAL_COMMUNITY)
Admission: AD | Admit: 2012-01-07 | Discharge: 2012-01-07 | Disposition: A | Discharge status: Home / Self Care | Payer: Self-pay | Source: Ambulatory Visit | Attending: Obstetrics & Gynecology | Admitting: Obstetrics & Gynecology

## 2012-01-07 ENCOUNTER — Encounter (HOSPITAL_COMMUNITY): Payer: Self-pay | Admitting: Obstetrics and Gynecology

## 2012-01-07 DIAGNOSIS — R109 Unspecified abdominal pain: Secondary | ICD-10-CM | POA: Insufficient documentation

## 2012-01-07 DIAGNOSIS — B9689 Other specified bacterial agents as the cause of diseases classified elsewhere: Secondary | ICD-10-CM | POA: Insufficient documentation

## 2012-01-07 DIAGNOSIS — N760 Acute vaginitis: Secondary | ICD-10-CM | POA: Insufficient documentation

## 2012-01-07 DIAGNOSIS — J40 Bronchitis, not specified as acute or chronic: Secondary | ICD-10-CM | POA: Insufficient documentation

## 2012-01-07 DIAGNOSIS — R300 Dysuria: Secondary | ICD-10-CM | POA: Insufficient documentation

## 2012-01-07 DIAGNOSIS — A499 Bacterial infection, unspecified: Secondary | ICD-10-CM | POA: Insufficient documentation

## 2012-01-07 LAB — COMPREHENSIVE METABOLIC PANEL
ALT: 10 U/L (ref 0–35)
Albumin: 3.5 g/dL (ref 3.5–5.2)
BUN: 5 mg/dL — ABNORMAL LOW (ref 6–23)
Calcium: 9.1 mg/dL (ref 8.4–10.5)
GFR calc Af Amer: >90 mL/min (ref >90)
Glucose, Bld: 107 mg/dL — ABNORMAL HIGH (ref 70–99)
Sodium: 136 mEq/L (ref 135–145)
Total Protein: 7.7 g/dL (ref 6.0–8.3)

## 2012-01-07 LAB — URINALYSIS, ROUTINE W REFLEX MICROSCOPIC
Ketones, ur: NEGATIVE mg/dL
Leukocytes, UA: NEGATIVE
Nitrite: NEGATIVE
Specific Gravity, Urine: <1.005 — ABNORMAL LOW (ref 1.005–1.030)
Urobilinogen, UA: 0.2 mg/dL (ref 0.0–1.0)
pH: 7.5 (ref 5.0–8.0)

## 2012-01-07 LAB — CBC
Hemoglobin: 14.4 g/dL (ref 12.0–15.0)
MCH: 28.1 pg (ref 26.0–34.0)
MCHC: 31.9 g/dL (ref 30.0–36.0)
RDW: 16.1 % — ABNORMAL HIGH (ref 11.5–15.5)

## 2012-01-07 LAB — URINE MICROSCOPIC-ADD ON

## 2012-01-07 LAB — WET PREP, GENITAL: Yeast Wet Prep HPF POC: NONE SEEN

## 2012-01-07 MED ORDER — METRONIDAZOLE 500 MG PO TABS: 500.0000 mg | ORAL_TABLET | Freq: Two times a day (BID) | ORAL | Status: DC

## 2012-01-07 MED ORDER — GUAIFENESIN ER 600 MG PO TB12: 1200.0000 mg | ORAL_TABLET | Freq: Two times a day (BID) | ORAL | Status: DC

## 2012-01-07 MED ORDER — ALBUTEROL SULFATE (5 MG/ML) 0.5% IN NEBU: 2.5000 mg | INHALATION_SOLUTION | Freq: Once | RESPIRATORY_TRACT | Status: DC

## 2012-01-07 MED ORDER — ALBUTEROL SULFATE (5 MG/ML) 0.5% IN NEBU
INHALATION_SOLUTION | RESPIRATORY_TRACT | Status: AC
  Filled 2012-01-07: qty 0.5

## 2012-01-07 MED ORDER — ALBUTEROL SULFATE HFA 108 (90 BASE) MCG/ACT IN AERS: 2.0000 | INHALATION_SPRAY | Freq: Four times a day (QID) | RESPIRATORY_TRACT | Status: DC | PRN

## 2012-01-07 NOTE — MAU Provider Note (Signed)
History     CSN: [id number]  Arrival date and time: 01/07/12 1125   First Provider Initiated Contact with Patient 01/07/12 1159      Chief Complaint  Patient presents with  . Dysuria   HPI 26 y.o. G1P1001 with right sided pain, worse with inspiration or coughing, started about 1 month ago. Also c/o burning with urination x 1 week. Increased vaginal discharge, no odor.     Past Medical History  Diagnosis Date  . Asthma     AS A CHILD    History reviewed. No pertinent past surgical history.  Family History  Problem Relation Age of Onset  . Anesthesia problems Neg Hx   . Hypotension Neg Hx   . Malignant hyperthermia Neg Hx   . Pseudochol deficiency Neg Hx     History  Substance Use Topics  . Smoking status: Current Some Day Smoker -- 0.2 packs/day    Types: Cigarettes  . Smokeless tobacco: Not on file  . Alcohol Use: No    Allergies: No Known Allergies  No prescriptions prior to admission    Review of Systems  Constitutional: Negative.   Respiratory: Positive for cough and wheezing.   Cardiovascular: Negative.   Gastrointestinal: Negative for nausea, vomiting, abdominal pain, diarrhea and constipation.  Genitourinary: Positive for dysuria. Negative for urgency, frequency, hematuria and flank pain.       Positive for vaginal discharge   Musculoskeletal: Negative.   Neurological: Negative.   Psychiatric/Behavioral: Negative.    Physical Exam   Blood pressure 117/77, pulse 79, temperature 97.7 F (36.5 C), temperature source Oral, resp. rate 18, height 5\' 5"  (1.651 m), weight 120 lb 9.6 oz (54.704 kg), last menstrual period 01/01/2012, not currently breastfeeding.  Physical Exam  Nursing note and vitals reviewed. Constitutional: She is oriented to person, place, and time. She appears well-developed and well-nourished. No distress.  HENT:  Head: Normocephalic and atraumatic.  Cardiovascular: Normal rate, regular rhythm and normal heart sounds.     Respiratory: Effort normal. No respiratory distress. She has wheezes (all fields).  GI: Soft. Bowel sounds are normal. She exhibits no distension and no mass. There is no tenderness. There is no rebound and no guarding.  Genitourinary: There is no rash or lesion on the right labia. There is no rash or lesion on the left labia. Uterus is not deviated, not enlarged, not fixed and not tender. Cervix exhibits no motion tenderness, no discharge and no friability. Right adnexum displays no mass, no tenderness and no fullness. Left adnexum displays no mass, no tenderness and no fullness. No erythema, tenderness or bleeding around the vagina. Vaginal discharge (white) found.  Musculoskeletal: Normal range of motion.  Neurological: She is alert and oriented to person, place, and time.  Skin: Skin is warm and dry.  Psychiatric: She has a normal mood and affect.    MAU Course  Procedures Results for orders placed during the hospital encounter of 01/07/12 (from the past 48 hour(s))  URINALYSIS, ROUTINE W REFLEX MICROSCOPIC     Status: Abnormal   Collection Time   01/07/12 11:40 AM      Component Value Range Comment   Color, Urine YELLOW  YELLOW    APPearance CLEAR  CLEAR    Specific Gravity, Urine <1.005 (*) 1.005 - 1.030    pH 7.5  5.0 - 8.0    Glucose, UA NEGATIVE  NEGATIVE mg/dL    Hgb urine dipstick MODERATE (*) NEGATIVE    Bilirubin Urine NEGATIVE  NEGATIVE  Ketones, ur NEGATIVE  NEGATIVE mg/dL    Protein, ur NEGATIVE  NEGATIVE mg/dL    Urobilinogen, UA 0.2  0.0 - 1.0 mg/dL    Nitrite NEGATIVE  NEGATIVE    Leukocytes, UA NEGATIVE  NEGATIVE   URINE MICROSCOPIC-ADD ON     Status: Abnormal   Collection Time   01/07/12 11:40 AM      Component Value Range Comment   Squamous Epithelial / LPF FEW (*) RARE    WBC, UA 0-2  <3 WBC/hpf    RBC / HPF 0-2  <3 RBC/hpf    Bacteria, UA RARE  RARE   POCT PREGNANCY, URINE     Status: Normal   Collection Time   01/07/12 11:47 AM      Component Value  Range Comment   Preg Test, Ur NEGATIVE  NEGATIVE   CBC     Status: Abnormal   Collection Time   01/07/12 12:05 PM      Component Value Range Comment   WBC 7.4  4.0 - 10.5 K/uL    RBC 5.13 (*) 3.87 - 5.11 MIL/uL    Hemoglobin 14.4  12.0 - 15.0 g/dL    HCT 16.1  09.6 - 04.5 %    MCV 88.1  78.0 - 100.0 fL    MCH 28.1  26.0 - 34.0 pg    MCHC 31.9  30.0 - 36.0 g/dL    RDW 40.9 (*) 81.1 - 15.5 %    Platelets 330  150 - 400 K/uL   COMPREHENSIVE METABOLIC PANEL     Status: Abnormal   Collection Time   01/07/12 12:05 PM      Component Value Range Comment   Sodium 136  135 - 145 mEq/L    Potassium 4.0  3.5 - 5.1 mEq/L    Chloride 103  96 - 112 mEq/L    CO2 22  19 - 32 mEq/L    Glucose, Bld 107 (*) 70 - 99 mg/dL    BUN 5 (*) 6 - 23 mg/dL    Creatinine, Ser 9.14  0.50 - 1.10 mg/dL    Calcium 9.1  8.4 - 78.2 mg/dL    Total Protein 7.7  6.0 - 8.3 g/dL    Albumin 3.5  3.5 - 5.2 g/dL    AST 18  0 - 37 U/L    ALT 10  0 - 35 U/L    Alkaline Phosphatase 92  39 - 117 U/L    Total Bilirubin 0.4  0.3 - 1.2 mg/dL    GFR calc non Af Amer >90  >90 mL/min    GFR calc Af Amer >90  >90 mL/min   WET PREP, GENITAL     Status: Abnormal   Collection Time   01/07/12 12:15 PM      Component Value Range Comment   Yeast Wet Prep HPF POC NONE SEEN  NONE SEEN    Trich, Wet Prep NONE SEEN  NONE SEEN    Clue Cells Wet Prep HPF POC MODERATE (*) NONE SEEN    WBC, Wet Prep HPF POC FEW (*) NONE SEEN MODERATE BACTERIA SEEN   Wheezing somewhat improved with albuterol nebulizer, pain somewhat improved   Assessment and Plan   1. Bronchitis   2. BV (bacterial vaginosis)       Medication List     As of 01/08/2012  5:07 PM    START taking these medications         albuterol 108 (90 BASE) MCG/ACT  inhaler   Commonly known as: PROVENTIL HFA;VENTOLIN HFA   Inhale 2 puffs into the lungs every 6 (six) hours as needed for wheezing.      guaiFENesin 600 MG 12 hr tablet   Commonly known as: MUCINEX   Take 2  tablets (1,200 mg total) by mouth 2 (two) times daily.      metroNIDAZOLE 500 MG tablet   Commonly known as: FLAGYL   Take 1 tablet (500 mg total) by mouth 2 (two) times daily.          Where to get your medications    These are the prescriptions that you need to pick up. We sent them to a specific pharmacy, so you will need to go there to get them.   [facility] [place], [place] - [address]    [address] [place] [address]    Phone: [phone]        albuterol 108 (90 BASE) MCG/ACT inhaler   guaiFENesin 600 MG 12 hr tablet   metroNIDAZOLE 500 MG tablet            Follow-up Information    Follow up with [facility] (If symptoms worsen)    Contact information:   [address] [id number] mc [place] [address] [phone]           Arlana Canizales 01/07/2012, 12:00 PM

## 2012-01-07 NOTE — MAU Note (Signed)
Pt reports she has been having burning when she urinates off and on for the past week. Also c/o of a pain in Right since under her ribs that comes and goes and has been bothering her off and on for several months.

## 2012-01-07 NOTE — MAU Note (Signed)
Pt presents to MAU with chief complaint of upper right abdominal pain that hurts when she takes a deep breath in. Pt is also complaining of painful urination. LMP 01/01/12; longer than normal. No birthcontrol at this time. Pt says the pain in her upper abdomen started 1 month ago.

## 2012-01-08 LAB — GC/CHLAMYDIA PROBE AMP, GENITAL
Chlamydia, DNA Probe: POSITIVE — AB
GC Probe Amp, Genital: NEGATIVE

## 2013-04-09 ENCOUNTER — Encounter (HOSPITAL_COMMUNITY): Payer: Self-pay | Admitting: Emergency Medicine

## 2013-04-09 DIAGNOSIS — R630 Anorexia: Secondary | ICD-10-CM | POA: Insufficient documentation

## 2013-04-09 DIAGNOSIS — Z3202 Encounter for pregnancy test, result negative: Secondary | ICD-10-CM | POA: Insufficient documentation

## 2013-04-09 DIAGNOSIS — R6883 Chills (without fever): Secondary | ICD-10-CM | POA: Insufficient documentation

## 2013-04-09 DIAGNOSIS — F17200 Nicotine dependence, unspecified, uncomplicated: Secondary | ICD-10-CM | POA: Insufficient documentation

## 2013-04-09 DIAGNOSIS — R5383 Other fatigue: Secondary | ICD-10-CM

## 2013-04-09 DIAGNOSIS — Z79899 Other long term (current) drug therapy: Secondary | ICD-10-CM | POA: Insufficient documentation

## 2013-04-09 DIAGNOSIS — H53149 Visual discomfort, unspecified: Secondary | ICD-10-CM | POA: Insufficient documentation

## 2013-04-09 DIAGNOSIS — IMO0001 Reserved for inherently not codable concepts without codable children: Secondary | ICD-10-CM | POA: Insufficient documentation

## 2013-04-09 DIAGNOSIS — R5381 Other malaise: Secondary | ICD-10-CM | POA: Insufficient documentation

## 2013-04-09 DIAGNOSIS — J45909 Unspecified asthma, uncomplicated: Secondary | ICD-10-CM | POA: Insufficient documentation

## 2013-04-09 DIAGNOSIS — R51 Headache: Secondary | ICD-10-CM | POA: Insufficient documentation

## 2013-04-09 DIAGNOSIS — M2550 Pain in unspecified joint: Secondary | ICD-10-CM | POA: Insufficient documentation

## 2013-04-09 DIAGNOSIS — F1120 Opioid dependence, uncomplicated: Secondary | ICD-10-CM | POA: Insufficient documentation

## 2013-04-09 DIAGNOSIS — F19939 Other psychoactive substance use, unspecified with withdrawal, unspecified: Secondary | ICD-10-CM | POA: Insufficient documentation

## 2013-04-09 DIAGNOSIS — R42 Dizziness and giddiness: Secondary | ICD-10-CM | POA: Insufficient documentation

## 2013-04-09 LAB — BASIC METABOLIC PANEL
BUN: 22 mg/dL (ref 6–23)
CALCIUM: 9.4 mg/dL (ref 8.4–10.5)
CO2: 25 mEq/L (ref 19–32)
CREATININE: 1.01 mg/dL (ref 0.50–1.10)
Chloride: 92 mEq/L — ABNORMAL LOW (ref 96–112)
GFR calc Af Amer: 89 mL/min — ABNORMAL LOW (ref >90)
GFR, EST NON AFRICAN AMERICAN: 77 mL/min — AB (ref >90)
Glucose, Bld: 110 mg/dL — ABNORMAL HIGH (ref 70–99)
Potassium: 4.3 mEq/L (ref 3.7–5.3)
SODIUM: 136 meq/L — AB (ref 137–147)

## 2013-04-09 LAB — CBC WITH DIFFERENTIAL/PLATELET
BASOS ABS: 0 10*3/uL (ref 0.0–0.1)
Basophils Relative: 0 % (ref 0–1)
EOS ABS: 0 10*3/uL (ref 0.0–0.7)
Eosinophils Relative: 0 % (ref 0–5)
HCT: 41.9 % (ref 36.0–46.0)
Hemoglobin: 14.9 g/dL (ref 12.0–15.0)
LYMPHS ABS: 1.5 10*3/uL (ref 0.7–4.0)
Lymphocytes Relative: 6 % — ABNORMAL LOW (ref 12–46)
MCH: 30.2 pg (ref 26.0–34.0)
MCHC: 35.6 g/dL (ref 30.0–36.0)
MCV: 84.8 fL (ref 78.0–100.0)
MONO ABS: 1.5 10*3/uL — AB (ref 0.1–1.0)
Monocytes Relative: 6 % (ref 3–12)
NEUTROS PCT: 88 % — AB (ref 43–77)
Neutro Abs: 22.1 10*3/uL — ABNORMAL HIGH (ref 1.7–7.7)
PLATELETS: 175 10*3/uL (ref 150–400)
RBC: 4.94 MIL/uL (ref 3.87–5.11)
RDW: 14.2 % (ref 11.5–15.5)
WBC: 25.1 10*3/uL — ABNORMAL HIGH (ref 4.0–10.5)

## 2013-04-09 LAB — POCT PREGNANCY, URINE: PREG TEST UR: NEGATIVE

## 2013-04-09 MED ORDER — ONDANSETRON 4 MG PO TBDP
8.0000 mg | ORAL_TABLET | Freq: Once | ORAL | Status: AC
  Administered 2013-04-09: 8 mg via ORAL
  Filled 2013-04-09: qty 2

## 2013-04-09 MED ORDER — ACETAMINOPHEN 325 MG PO TABS
650.0000 mg | ORAL_TABLET | Freq: Once | ORAL | Status: AC
  Administered 2013-04-09: 650 mg via ORAL
  Filled 2013-04-09: qty 2

## 2013-04-09 NOTE — ED Notes (Addendum)
Presents with 1 day of gradual onset of headache, body aches, chills, nausea, vomiting and inability to hold fluids down. Family unsure how high fever was at home, reports that she has been hot and then cold. Pt alert, oriented. Chills, body aches and headache became worse today. VSS. Denies diarrhea, reports constipation for the last few days due to opiate usage, reports normal bowel movement today.  Was using Opiates everyday up until a couple weeks ago, last use a few days ago. 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 "this is not withdrawal, I know what that feels like and this is not it"  Denies neck stiffness, reports sensitivity to light.

## 2013-04-10 ENCOUNTER — Emergency Department (HOSPITAL_COMMUNITY)
Admission: EM | Admit: 2013-04-10 | Discharge: 2013-04-10 | Disposition: A | Discharge status: Home / Self Care | Payer: Self-pay | Attending: Emergency Medicine | Admitting: Emergency Medicine

## 2013-04-10 ENCOUNTER — Emergency Department (HOSPITAL_COMMUNITY): Payer: Self-pay

## 2013-04-10 DIAGNOSIS — R519 Headache, unspecified: Secondary | ICD-10-CM

## 2013-04-10 DIAGNOSIS — F1193 Opioid use, unspecified with withdrawal: Secondary | ICD-10-CM

## 2013-04-10 DIAGNOSIS — F1123 Opioid dependence with withdrawal: Secondary | ICD-10-CM

## 2013-04-10 DIAGNOSIS — R51 Headache: Secondary | ICD-10-CM

## 2013-04-10 LAB — CSF CELL COUNT WITH DIFFERENTIAL
RBC Count, CSF: 0 /mm3
RBC Count, CSF: 4 /mm3 — ABNORMAL HIGH
Tube #: 1
Tube #: 4
WBC CSF: 3 /mm3 (ref 0–5)
WBC CSF: 4 /mm3 (ref 0–5)

## 2013-04-10 LAB — HEPATIC FUNCTION PANEL
ALT: 31 U/L (ref 0–35)
AST: 38 U/L — ABNORMAL HIGH (ref 0–37)
Albumin: 4 g/dL (ref 3.5–5.2)
Alkaline Phosphatase: 124 U/L — ABNORMAL HIGH (ref 39–117)
BILIRUBIN DIRECT: 0.4 mg/dL — AB (ref 0.0–0.3)
BILIRUBIN INDIRECT: 0.8 mg/dL (ref 0.3–0.9)
TOTAL PROTEIN: 8.8 g/dL — AB (ref 6.0–8.3)
Total Bilirubin: 1.2 mg/dL (ref 0.3–1.2)

## 2013-04-10 LAB — INFLUENZA PANEL BY PCR (TYPE A & B)
H1N1FLUPCR: NOT DETECTED
INFLBPCR: NEGATIVE
Influenza A By PCR: NEGATIVE

## 2013-04-10 LAB — PROTEIN AND GLUCOSE, CSF
GLUCOSE CSF: 63 mg/dL (ref 43–76)
TOTAL PROTEIN, CSF: 18 mg/dL (ref 15–45)

## 2013-04-10 LAB — GRAM STAIN: GRAM STAIN: NONE SEEN

## 2013-04-10 LAB — LIPASE, BLOOD: LIPASE: 16 U/L (ref 11–59)

## 2013-04-10 MED ORDER — DIPHENHYDRAMINE HCL 50 MG/ML IJ SOLN
25.0000 mg | Freq: Once | INTRAMUSCULAR | Status: AC
Start: 2013-04-10 — End: 2013-04-10
  Administered 2013-04-10: 25 mg via INTRAVENOUS
  Filled 2013-04-10: qty 1

## 2013-04-10 MED ORDER — ONDANSETRON HCL 4 MG/2ML IJ SOLN
4.0000 mg | Freq: Once | INTRAMUSCULAR | Status: AC
  Administered 2013-04-10: 4 mg via INTRAVENOUS
  Filled 2013-04-10: qty 2

## 2013-04-10 MED ORDER — DICYCLOMINE HCL 20 MG PO TABS: 20.0000 mg | ORAL_TABLET | Freq: Two times a day (BID) | ORAL | Status: DC

## 2013-04-10 MED ORDER — SODIUM CHLORIDE 0.9 % IV BOLUS (SEPSIS)
1000.0000 mL | Freq: Once | INTRAVENOUS | Status: AC
  Administered 2013-04-10: 1000 mL via INTRAVENOUS

## 2013-04-10 MED ORDER — ONDANSETRON HCL 4 MG PO TABS: 4.0000 mg | ORAL_TABLET | Freq: Four times a day (QID) | ORAL | Status: DC

## 2013-04-10 MED ORDER — METOCLOPRAMIDE HCL 5 MG/ML IJ SOLN
10.0000 mg | Freq: Once | INTRAMUSCULAR | Status: AC
  Administered 2013-04-10: 10 mg via INTRAVENOUS
  Filled 2013-04-10: qty 2

## 2013-04-10 MED ORDER — KETOROLAC TROMETHAMINE 30 MG/ML IJ SOLN: 30.0000 mg | Freq: Once | INTRAMUSCULAR | Status: DC

## 2013-04-10 MED ORDER — MORPHINE SULFATE 4 MG/ML IJ SOLN
4.0000 mg | Freq: Once | INTRAMUSCULAR | Status: AC
  Administered 2013-04-10: 4 mg via INTRAVENOUS
  Filled 2013-04-10: qty 1

## 2013-04-10 NOTE — ED Notes (Signed)
Pt provided water for PO challenge.  Pt given instructions to not drink the water to fast to help settle stomach.

## 2013-04-10 NOTE — ED Provider Notes (Signed)
CSN: [id number]     Arrival date & time 04/09/13  2111 History   First MD Initiated Contact with Patient 04/10/13 0132     Chief Complaint  Patient presents with  . Nausea   (Consider location/radiation/quality/duration/timing/severity/associated sxs/prior Treatment) HPI Comments: Patient presents with a one-day history of progressively worsening frontal headache. Denies thunderclap onset. Associated with photophobia, nausea and vomiting. No previous history of headaches. Has had chills but no fever at home. Has abdominal pain earlier which has resolved. Denies any cough, congestion, sore throat, rhinorrhea. Notably she has a history of IV drug abuse with last abuse 2 days ago. She does not feel that she is in withdrawal. She denies any diarrhea. She's had shaking chills but has not documented any fevers.  The history is provided by the patient.    Past Medical History  Diagnosis Date  . Asthma     AS A CHILD   History reviewed. No pertinent past surgical history. Family History  Problem Relation Age of Onset  . Anesthesia problems Neg Hx   . Hypotension Neg Hx   . Malignant hyperthermia Neg Hx   . Pseudochol deficiency Neg Hx    History  Substance Use Topics  . Smoking status: Current Some Day Smoker -- 0.25 packs/day    Types: Cigarettes  . Smokeless tobacco: Not on file  . Alcohol Use: No   OB History   Grav Para Term Preterm Abortions TAB SAB Ect Mult Living   1 1 1  0 0 0 0 0 0 1     Review of Systems  Constitutional: Positive for chills, activity change, appetite change and fatigue. Negative for fever.  HENT: Negative for congestion, ear pain, rhinorrhea and sore throat.   Eyes: Positive for photophobia. Negative for visual disturbance.  Respiratory: Negative for cough, chest tightness and shortness of breath.   Cardiovascular: Negative for chest pain.  Gastrointestinal: Positive for nausea and vomiting. Negative for abdominal pain.  Genitourinary: Negative for  dysuria, hematuria, vaginal bleeding and vaginal discharge.  Musculoskeletal: Positive for arthralgias and myalgias. Negative for back pain, neck pain and neck stiffness.  Neurological: Positive for dizziness, weakness and headaches.  A complete 10 system review of systems was obtained and all systems are negative except as noted in the HPI and PMH.    Allergies  Review of patient's allergies indicates no known allergies.  Home Medications   Current Outpatient Rx  Name  Route  Sig  Dispense  Refill  . ibuprofen (ADVIL,MOTRIN) 200 MG tablet   Oral   Take 200 mg by mouth every 6 (six) hours as needed for moderate pain.         Marland Kitchen dicyclomine (BENTYL) 20 MG tablet   Oral   Take 1 tablet (20 mg total) by mouth 2 (two) times daily.   20 tablet   0   . ondansetron (ZOFRAN) 4 MG tablet   Oral   Take 1 tablet (4 mg total) by mouth every 6 (six) hours.   12 tablet   0    BP 132/75  Pulse 89  Temp(Src) 98.2 F (36.8 C) (Oral)  Resp 16  Ht 5\' 5"  (1.651 m)  Wt 118 lb 9.6 oz (53.797 kg)  BMI 19.74 kg/m2  SpO2 99%  LMP 03/17/2013 Physical Exam  Constitutional: She is oriented to person, place, and time. She appears well-developed and well-nourished. No distress.  HENT:  Head: Normocephalic and atraumatic.  Mouth/Throat: Oropharynx is clear and moist. No oropharyngeal exudate.  Photophobic  Eyes: Conjunctivae and EOM are normal. Pupils are equal, round, and reactive to light.  Neck: Normal range of motion. Neck supple.  No meningismus  Cardiovascular: Normal rate, regular rhythm and normal heart sounds.   No murmur heard. Pulmonary/Chest: Effort normal and breath sounds normal. No respiratory distress.  Abdominal: Soft. There is no tenderness. There is no rebound and no guarding.  Musculoskeletal: Normal range of motion. She exhibits no edema and no tenderness.  Tract marks upper extremities  Neurological: She is alert and oriented to person, place, and time. No cranial nerve  deficit. She exhibits normal muscle tone. Coordination normal.  CN 2-12 intact, no ataxia on finger to nose, no nystagmus, 5/5 strength throughout, no pronator drift, Romberg negative, normal gait.   Skin: Skin is warm.    ED Course  LUMBAR PUNCTURE Date/Time: 04/10/2013 3:55 AM Performed by: Glynn OctaveANCOUR, Iyana Topor Authorized by: Glynn OctaveANCOUR, Rusty Villella Consent: Verbal consent obtained. written consent obtained. Risks and benefits: risks, benefits and alternatives were discussed Consent given by: patient Patient understanding: patient states understanding of the procedure being performed Patient consent: the patient's understanding of the procedure matches consent given Procedure consent: procedure consent matches procedure scheduled Relevant documents: relevant documents present and verified Test results: test results available and properly labeled Site marked: the operative site was marked Imaging studies: imaging studies available Patient identity confirmed: verbally with patient and arm band Time out: Immediately prior to procedure a "time out" was called to verify the correct patient, procedure, equipment, support staff and site/side marked as required. Indications: evaluation for infection Anesthesia: local infiltration Local anesthetic: lidocaine 1% without epinephrine Anesthetic total: 4 ml Patient sedated: no Preparation: Patient was prepped and draped in the usual sterile fashion. Lumbar space: L4-L5 interspace Patient's position: left lateral decubitus Needle gauge: 20 Needle type: spinal needle - Quincke tip Needle length: 2.5 in Number of attempts: 2 Fluid appearance: clear Tubes of fluid: 4 Total volume: 8 ml Post-procedure: site cleaned, pressure dressing applied and adhesive bandage applied Patient tolerance: Patient tolerated the procedure well with no immediate complications.   (including critical care time) Labs Review Labs Reviewed  CBC WITH DIFFERENTIAL - Abnormal;  Notable for the following:    WBC 25.1 (*)    Neutrophils Relative % 88 (*)    Lymphocytes Relative 6 (*)    Neutro Abs 22.1 (*)    Monocytes Absolute 1.5 (*)    All other components within normal limits  BASIC METABOLIC PANEL - Abnormal; Notable for the following:    Sodium 136 (*)    Chloride 92 (*)    Glucose, Bld 110 (*)    GFR calc non Af Amer 77 (*)    GFR calc Af Amer 89 (*)    All other components within normal limits  HEPATIC FUNCTION PANEL - Abnormal; Notable for the following:    Total Protein 8.8 (*)    AST 38 (*)    Alkaline Phosphatase 124 (*)    Bilirubin, Direct 0.4 (*)    All other components within normal limits  CSF CELL COUNT WITH DIFFERENTIAL - Abnormal; Notable for the following:    Color, CSF CLEAR (*)    Appearance, CSF COLORLESS (*)    RBC Count, CSF 4 (*)    All other components within normal limits  CSF CELL COUNT WITH DIFFERENTIAL - Abnormal; Notable for the following:    Color, CSF CLEAR (*)    Appearance, CSF COLORLESS (*)    All other components within normal  limits  GRAM STAIN  CSF CULTURE  LIPASE, BLOOD  PROTEIN AND GLUCOSE, CSF  CSF CELL COUNT WITH DIFFERENTIAL  INFLUENZA PANEL BY PCR (TYPE A & B, H1N1)  POCT PREGNANCY, URINE   Imaging Review Dg Chest 2 View  04/10/2013   CLINICAL DATA:  Fever  EXAM: CHEST  2 VIEW  COMPARISON:  None.  FINDINGS: Lungs are clear. Heart size and pulmonary vascularity are normal. No adenopathy. No bone lesions.  IMPRESSION: No abnormality noted.   Electronically Signed   By: Bretta Bang M.D.   On: 04/10/2013 02:00   Ct Head Wo Contrast  04/10/2013   CLINICAL DATA:  Headache  EXAM: CT HEAD WITHOUT CONTRAST  TECHNIQUE: Contiguous axial images were obtained from the base of the skull through the vertex without intravenous contrast.  COMPARISON:  None available  FINDINGS: There is no acute intracranial hemorrhage or infarct. No mass lesion or midline shift. Gray-white matter differentiation is well maintained.  Ventricles are normal in size without evidence of hydrocephalus. CSF containing spaces are within normal limits. No extra-axial fluid collection.  The calvarium is intact.  Orbital soft tissues are within normal limits.  The paranasal sinuses and mastoid air cells are well pneumatized and free of fluid.  Scalp soft tissues are unremarkable.  IMPRESSION: Normal head CT with no acute intracranial abnormality identified.   Electronically Signed   By: Rise Mu M.D.   On: 04/10/2013 02:59    EKG Interpretation   None       MDM   1. Headache   2. Opiate withdrawal    Progressively worsening headache with nausea and vomiting. No fevers. No focal neurological deficits. No meningismus on exam.  Leukocytosis on labs,. As patient has chills, headache and unspecified leukocytosis, we'll proceed with lumbar puncture to rule out meningitis or encephalitis. She is agreeable to proceed. However she has normal neurological exam no meningismus.  LP results show no evidence of infection or hemorrhage. Question whether patient's symptoms are secondary to opiate withdrawal. Flu is also a possibility. Flu swab pending.  Patient is tolerating by mouth in stable for discharge.  BP 132/75  Pulse 89  Temp(Src) 98.2 F (36.8 C) (Oral)  Resp 16  Ht 5\' 5"  (1.651 m)  Wt 118 lb 9.6 oz (53.797 kg)  BMI 19.74 kg/m2  SpO2 99%  LMP 03/17/2013   Glynn Octave, MD 04/10/13 [phone]

## 2013-04-10 NOTE — Discharge Instructions (Signed)

## 2013-04-10 NOTE — ED Notes (Addendum)
Pt states starting last night she has been having chills, nausea, headahces and some abd pain.  Pt states she does not know of any friends or family who have had similar symptoms.

## 2013-04-13 LAB — CSF CULTURE W GRAM STAIN
Culture: NO GROWTH
Gram Stain: NONE SEEN

## 2013-04-13 LAB — CSF CULTURE: SPECIAL REQUESTS: NORMAL

## 2013-08-07 ENCOUNTER — Encounter (HOSPITAL_COMMUNITY): Payer: Self-pay | Admitting: Emergency Medicine

## 2013-08-07 ENCOUNTER — Emergency Department (HOSPITAL_COMMUNITY)
Admission: EM | Admit: 2013-08-07 | Discharge: 2013-08-07 | Disposition: A | Discharge status: Home / Self Care | Payer: Medicaid Other | Attending: Emergency Medicine | Admitting: Emergency Medicine

## 2013-08-07 DIAGNOSIS — R221 Localized swelling, mass and lump, neck: Secondary | ICD-10-CM

## 2013-08-07 DIAGNOSIS — K0889 Other specified disorders of teeth and supporting structures: Secondary | ICD-10-CM

## 2013-08-07 DIAGNOSIS — H109 Unspecified conjunctivitis: Secondary | ICD-10-CM

## 2013-08-07 DIAGNOSIS — K089 Disorder of teeth and supporting structures, unspecified: Secondary | ICD-10-CM | POA: Insufficient documentation

## 2013-08-07 DIAGNOSIS — F17200 Nicotine dependence, unspecified, uncomplicated: Secondary | ICD-10-CM | POA: Insufficient documentation

## 2013-08-07 DIAGNOSIS — Z79899 Other long term (current) drug therapy: Secondary | ICD-10-CM | POA: Insufficient documentation

## 2013-08-07 DIAGNOSIS — R220 Localized swelling, mass and lump, head: Secondary | ICD-10-CM | POA: Insufficient documentation

## 2013-08-07 DIAGNOSIS — J45909 Unspecified asthma, uncomplicated: Secondary | ICD-10-CM | POA: Insufficient documentation

## 2013-08-07 DIAGNOSIS — K0381 Cracked tooth: Secondary | ICD-10-CM | POA: Insufficient documentation

## 2013-08-07 MED ORDER — OXYCODONE-ACETAMINOPHEN 5-325 MG PO TABS: 1.0000 | ORAL_TABLET | Freq: Four times a day (QID) | ORAL | Status: DC | PRN

## 2013-08-07 MED ORDER — OXYCODONE-ACETAMINOPHEN 5-325 MG PO TABS
2.0000 | ORAL_TABLET | Freq: Once | ORAL | Status: AC
  Administered 2013-08-07: 2 via ORAL
  Filled 2013-08-07: qty 2

## 2013-08-07 MED ORDER — PROMETHAZINE HCL 25 MG PO TABS: 25.0000 mg | ORAL_TABLET | Freq: Four times a day (QID) | ORAL | Status: DC | PRN

## 2013-08-07 MED ORDER — ONDANSETRON 8 MG PO TBDP
8.0000 mg | ORAL_TABLET | Freq: Once | ORAL | Status: AC
  Administered 2013-08-07: 8 mg via ORAL
  Filled 2013-08-07: qty 1

## 2013-08-07 MED ORDER — FLUORESCEIN SODIUM 1 MG OP STRP
1.0000 | ORAL_STRIP | Freq: Once | OPHTHALMIC | Status: AC
  Administered 2013-08-07: 1 via OPHTHALMIC
  Filled 2013-08-07: qty 1

## 2013-08-07 MED ORDER — CLINDAMYCIN HCL 300 MG PO CAPS: 300.0000 mg | ORAL_CAPSULE | Freq: Four times a day (QID) | ORAL | Status: DC

## 2013-08-07 MED ORDER — TETRACAINE HCL 0.5 % OP SOLN
1.0000 [drp] | Freq: Once | OPHTHALMIC | Status: AC
  Administered 2013-08-07: 1 [drp] via OPHTHALMIC
  Filled 2013-08-07: qty 2

## 2013-08-07 NOTE — ED Provider Notes (Signed)
CSN: [id number]     Arrival date & time 08/07/13  1517 History   This chart was scribed for Catherine SilkHannah Ella Guillotte, PA working with Flint MelterElliott L Wentz, MD, by Bronson CurbJacqueline Melvin, ED Scribe. This patient was seen in room WTR9/WTR9 and the patient's care was started at 4:01 PM.    Chief Complaint  Patient presents with  . Dental Pain  . Facial Swelling     The history is provided by the patient. No language interpreter was used.   HPI Comments: Catherine Hernandez is a 28 y.o. female who presents to the Emergency Department complaining of gradually worsening, intermittent right lower dental pain that began yesterday. She describes the pain as throbbing, but occasionally sharp. There is associated right sided facial swelling, sore throat, right eye redness and tearing. She denies any purulent drainage. Patient states she has taken Tylenol with no relief. She denies right eye pain and blurry vision. She does not wear glasses or contacts. She denies history of eye surgery or any significant health conditions.    Past Medical History  Diagnosis Date  . Asthma     AS A CHILD   History reviewed. No pertinent past surgical history. Family History  Problem Relation Age of Onset  . Anesthesia problems Neg Hx   . Hypotension Neg Hx   . Malignant hyperthermia Neg Hx   . Pseudochol deficiency Neg Hx    History  Substance Use Topics  . Smoking status: Current Some Day Smoker -- 0.25 packs/day    Types: Cigarettes  . Smokeless tobacco: Not on file  . Alcohol Use: No   OB History   Grav Para Term Preterm Abortions TAB SAB Ect Mult Living   1 1 1  0 0 0 0 0 0 1     Review of Systems  HENT: Positive for dental problem (dental pain), facial swelling (right side) and sore throat.   Eyes: Positive for discharge and redness. Negative for pain and visual disturbance.  All other systems reviewed and are negative.     Allergies  Review of patient's allergies indicates no known allergies.  Home Medications    Prior to Admission medications   Medication Sig Start Date End Date Taking? Authorizing Provider  dicyclomine (BENTYL) 20 MG tablet Take 1 tablet (20 mg total) by mouth 2 (two) times daily. 04/10/13   Glynn OctaveStephen Rancour, MD  ibuprofen (ADVIL,MOTRIN) 200 MG tablet Take 200 mg by mouth every 6 (six) hours as needed for moderate pain.    Historical Provider, MD  ondansetron (ZOFRAN) 4 MG tablet Take 1 tablet (4 mg total) by mouth every 6 (six) hours. 04/10/13   Glynn OctaveStephen Rancour, MD   Triage Vitals: BP 128/97  Pulse 92  Temp(Src) 98.8 F (37.1 C) (Oral)  Resp 18  Ht 5\' 5"  (1.651 m)  Wt 120 lb (54.432 kg)  BMI 19.97 kg/m2  SpO2 100%  Physical Exam  Nursing note and vitals reviewed. Constitutional: She is oriented to person, place, and time. She appears well-developed and well-nourished. No distress.  HENT:  Head: Normocephalic and atraumatic.  Right Ear: External ear normal.  Left Ear: External ear normal.  Nose: Nose normal.  Mouth/Throat: Oropharynx is clear and moist. No trismus in the jaw.  Generally poor dentition. Broken right lower molar. No trismus, submental edema, or tongue elevation.  Eyes: EOM and lids are normal. Pupils are equal, round, and reactive to light. Lids are everted and swept, no foreign bodies found. Right conjunctiva is injected. Right conjunctiva has  no hemorrhage. Left conjunctiva is not injected. Left conjunctiva has no hemorrhage.  Slit lamp exam:      The right eye shows no corneal abrasion, no corneal flare, no corneal ulcer, no foreign body, no hyphema, no hypopyon, no fluorescein uptake and no anterior chamber bulge.  No pain with EOM. No photophobia.  No purulent drainage.  IOP 20 in right eye measured with tonopen  Neck: Normal range of motion.  Cardiovascular: Normal rate, regular rhythm and normal heart sounds.   Pulmonary/Chest: Effort normal and breath sounds normal. No stridor. No respiratory distress. She has no wheezes. She has no rales.   Abdominal: Soft. She exhibits no distension.  Musculoskeletal: Normal range of motion.  Neurological: She is alert and oriented to person, place, and time. She has normal strength.  Skin: Skin is warm and dry. She is not diaphoretic. No erythema.  Psychiatric: She has a normal mood and affect. Her behavior is normal.    ED Course  Procedures (including critical care time)  DIAGNOSTIC STUDIES: Oxygen Saturation is 100% on room air, normal by my interpretation.    COORDINATION OF CARE: At 1615 Discussed treatment plan with patient which includes visual acuity screening. Patient agrees.   Labs Review Labs Reviewed - No data to display  Imaging Review No results found.   EKG Interpretation None      MDM   Final diagnoses:  Pain, dental  Conjunctivitis    Patient with toothache.  No gross abscess.  Exam unconcerning for Ludwig's angina or spread of infection.  Will treat with penicillin and pain medicine.  Urged patient to follow-up with dentist.    Patient additionally presents with eye redness. Presentation consistent with viral conjunctivitis.  No purulent discharge, corneal abrasions, entrapment, consensual photophobia, or dendritic staining with fluorescein study.  Presentation non-concerning for iritis, bacterial conjunctivitis, corneal abrasions, or HSV.  No antibiotics are indicated and patient will be prescribed naphazoline for itching.  Personal hygiene and frequent handwashing discussed.  Patient advised to followup with ophthalmologist if symptoms persist or worsen in any way including vision change or purulent discharge.  Patient verbalizes understanding and is agreeable with discharge.  I personally performed the services described in this documentation, which was scribed in my presence. The recorded information has been reviewed and is accurate.     [name], PA-C 08/10/13 2006

## 2013-08-07 NOTE — ED Notes (Signed)
Pt states started having R lower side dental pain yesterday, woke up this morning w/ R sided facial swelling and sore throat, pts R eye is blood shot and said its been like that x couple days.

## 2013-08-07 NOTE — Discharge Instructions (Signed)
Conjunctivitis Conjunctivitis is commonly called "pink eye." Conjunctivitis can be caused by bacterial or viral infection, allergies, or injuries. There is usually redness of the lining of the eye, itching, discomfort, and sometimes discharge. There may be deposits of matter along the eyelids. A viral infection usually causes a watery discharge, while a bacterial infection causes a yellowish, thick discharge. Pink eye is very contagious and spreads by direct contact. You may be given antibiotic eyedrops as part of your treatment. Before using your eye medicine, remove all drainage from the eye by washing gently with warm water and cotton balls. Continue to use the medication until you have awakened 2 mornings in a row without discharge from the eye. Do not rub your eye. This increases the irritation and helps spread infection. Use separate towels from other household members. Wash your hands with soap and water before and after touching your eyes. Use cold compresses to reduce pain and sunglasses to relieve irritation from light. Do not wear contact lenses or wear eye makeup until the infection is gone. SEEK MEDICAL CARE IF:   Your symptoms are not better after 3 days of treatment.  You have increased pain or trouble seeing.  The outer eyelids become very red or swollen. Document Released: 04/27/2004 Document Revised: 06/12/2011 Document Reviewed: 03/20/2005 [facility] Patient Information 2014 [place], [place].  Dental Pain Toothache is pain in or around a tooth. It may get worse with chewing or with cold or heat.  HOME CARE  Your dentist may use a numbing medicine during treatment. If so, you may need to avoid eating until the medicine wears off. Ask your dentist about this.  Only take medicine as told by your dentist or doctor.  Avoid chewing food near the painful tooth until after all treatment is done. Ask your dentist about this. GET HELP RIGHT AWAY IF:   The problem gets worse or new  problems appear.  You have a fever.  There is redness and puffiness (swelling) of the face, jaw, or neck.  You cannot open your mouth.  There is pain in the jaw.  There is very bad pain that is not helped by medicine. MAKE SURE YOU:   Understand these instructions.  Will watch your condition.  Will get help right away if you are not doing well or get worse. Document Released: 09/06/2007 Document Revised: 06/12/2011 Document Reviewed: 09/06/2007 [facility] Patient Information 2014 [place], [place].   Emergency Department Resource Guide 1) Find a Doctor and Pay Out of Pocket Although you won't have to find out who is covered by your insurance plan, it is a good idea to ask around and get recommendations. You will then need to call the office and see if the doctor you have chosen will accept you as a new patient and what types of options they offer for patients who are self-pay. Some doctors offer discounts or will set up payment plans for their patients who do not have insurance, but you will need to ask so you aren't surprised when you get to your appointment.  2) Contact Your Local Health Department Not all health departments have doctors that can see patients for sick visits, but many do, so it is worth a call to see if yours does. If you don't know where your local health department is, you can check in your phone book. The CDC also has a tool to help you locate your state's health department, and many state websites also have listings of all of their local health  departments.  3) Find a Walk-in Clinic If your illness is not likely to be very severe or complicated, you may want to try a walk in clinic. These are popping up all over the country in pharmacies, drugstores, and shopping centers. They're usually staffed by nurse practitioners or physician assistants that have been trained to treat common illnesses and complaints. They're usually fairly quick and inexpensive. However, if you  have serious medical issues or chronic medical problems, these are probably not your best option.  No Primary Care Doctor: - Call Health Connect at  [phone] - they can help you locate a primary care doctor that  accepts your insurance, provides certain services, etc. - Physician Referral Service- [phone]  Chronic Pain Problems: Organization         Address  Phone   Notes  [facility]  [phone] Patients need to be referred by their primary care doctor.   Medication Assistance: Organization         Address  Phone   Notes  [facility] Medication [facility] [address] [place] [place], [place] [address] [phone] --Must be a resident of [facility] -- Must have NO insurance coverage whatsoever (no Medicaid/ Medicare, etc.) -- The pt. MUST have a primary care doctor that directs their care regularly and follows them in the community   [facility]  [phone]   [employer]  [phone]    Agencies that provide inexpensive medical care: Organization         Address  Phone   Notes  [facility]  [phone]   [facility]    [phone]   [facility] [address] [place], [place] [address] [phone]   [facility] [address] [place], [place] [phone]   Planned Parenthood    [phone]   [facility]    [phone]   [facility]  [address], [place] Phone:  [phone], Fax:  [phone] Hours of Operation:  9 am - 6 pm, M-F.  Also accepts Medicaid/Medicare and self-pay.  [facility]  [address], [place] Phone: [phone], Fax: [phone]. Hours of Operation:  8:30 am - 5:30 pm, M-F.  Also accepts Medicaid and self-pay.  [facility] [place] [address] [place] Phone: [phone]   [facility] [address]  Natasha Bence [place], [place] [phone], Ext. [phone] Mondays & Thursdays: 7-9 AM.  First 15 patients are seen on a first come, first serve basis.    Medicaid-accepting [facility] Providers:  Organization         Address  Phone   Notes  [facility] [address],  AFB [phone] Also accepts self-pay patients.  [facility] [address] Laurell Josephs [place], [place]  [phone]   [facility] [address], [place] [phone]   [facility] [address] [place] [phone]   Renaye Rakers [address], [place]   [phone] Only accepts [place] patients after they have their name applied to their card.   Self-Pay (no insurance) in [facility]:  Organization         Address  Phone   Notes  Sickle Cell Patients, [facility] [address], [place] [phone]   [facility] [address], [place] [phone]) [phone]   [facility]  [address], [place] [phone]   [facility]/Dr. Osei-Bonsu  [address] [place] [address], [place] or [phone], [place] [phone] Phone number for both [place] and [place] locations is the same.  [facility] [address], [place] [phone]) [phone]   [facility] [place] [address] [place] [address], [place] or [address] [phone]   [facility] [address], [place] [phone]) [phone], phone; [phone]) [phone], fax Sees patients 1st and 3rd Saturday of every month.  Must not qualify for public or private insurance (i.e. Medicaid, Medicare, [place] Health Choice, Veterans' Benefits)  Household income should be no more than 200% of the poverty level The clinic cannot treat you if you are pregnant or think you are pregnant   Sexually transmitted diseases are not treated at the clinic.    Dental Care: Organization         Address  Phone  Notes  [facility] [address], [place] [phone] Accepts children up to age 26 who are enrolled in [place]icaid or [place] Health Choice; pregnant women with a Medicaid card; and children who have applied for Medicaid or [place] Health Choice, but were declined, whose parents can pay a reduced fee at time of service.  [facility] [place]  [address], [place] [phone] Accepts children up to age 26 who are enrolled in [place] or [place] Health Choice; pregnant women with a Medicaid card; and children who have applied for Medicaid or [place] Health Choice, but were declined, whose parents can pay a reduced fee at time of service.  [facility]  [address], [place] [phone] Patients are seen by appointment only. Walk-ins are not accepted. [facility] will see patients 26 years of age and older. Monday - Tuesday (8am-5pm) Most Wednesdays (8:30-5pm) $30 per visit, cash only  [facility]  [address], [place] [facility] [place] [phone] Patients are seen by appointment only. Walk-ins are not accepted. [facility] will see patients 118 years of age and older. One Wednesday Evening (Monthly: Volunteer Based).  $30 per visit, cash only  [employer] [facility] [facility]  [phone] for adults; Children under age 424, call Graduate Pediatric Dentistry at [phone]. Children aged 594-14, please call [phone] to request a pediatric application.  Dental services are provided in all areas of dental care including fillings, crowns and bridges, complete and partial dentures, implants, gum treatment, root canals, and extractions. Preventive care is also provided. Treatment is provided to both adults and children. Patients  are selected via a lottery and there is often a waiting list.   [facility] [address], [place]  [phone] [email or web address]   [facility] [address], [place], [place] [phone], Ext. [phone] Second and Fourth Thursday of each month, opens at 6:30 AM; Clinic ends at 9 AM.  Patients are seen on a first-come first-served basis, and a limited number are seen during each clinic.   [facility]  [address], [place], [place] [phone]   Eligibility Requirements You must have lived  in [place], [place], or [place] for at least the last three months.   You cannot be eligible for state or federal sponsored National Cityhealthcare insurance, including CIGNAVeterans Administration, [place]icaid, or [employer] EntertainmentMedicare.   You generally cannot be eligible for healthcare insurance through your employer.    How to apply: Eligibility screenings are held every Tuesday and Wednesday afternoon from 1:00 pm until 4:00 pm. You do not need an appointment for the interview!  [facility] [address], [place], [place] [phone]   [facility]  [phone]   [facility]  [phone]   [facility]  [phone]    Behavioral Health Resources in the Community: Intensive Outpatient Programs Organization         Address  Phone  Notes  [facility] [address], [place], [place] [phone]   [facility] [address], [place], [place] [phone]   [facility] [address], [place], [place]  [phone]   [facility] [address],  [place], [place] [phone] or [phone]   Substance Abuse Resources Organization         Address  Phone  Notes  [facility]  [phone]   [facility]  [phone]   [facility]  [phone]   [facility]  [phone]    Residential & Outpatient Substance Abuse Program  [phone]   Psychological Services Organization         Address  Phone  Notes  [facility]  [phone]- [phone]   [facility]  [phone]- [phone]   [facility] [address], [place] [phone] or [phone]    Mobile Crisis Teams Organization         Address  Phone  Notes  [facility], Mobile Crisis Care Unit  [phone]   [facility]  [address] [place], [place] [phone]   Doristine LocksSharon DeEsch [address] [place] [phone]    Self-Help/Support Groups Organization         Address  Phone             Notes  [facility] [place] - variety of support groups  336- [phone] Call for more information  Narcotics Anonymous (NA), [facility]aring [facility] [address], [place]  2 meetings at this location   Statisticianesidential Treatment Programs Organization         Address  Phone  Notes  [facility] [address],    [place]  [phone]   [facility]  [address], [place] [phone], [place], [place] [phone]   [facility] [address], [place] [phone] Admissions: 8am-3pm M-F  [facility] [address]    [place], [place] [phone]   [facility] [address], [place], [place] [phone]   [facility] [address]  [place], [place] [phone]   [facility] - Intensive Outpatient [address], [place], [place] [phone]   [facility] ([facility] [address]  [place], [place] [phone] or [phone]   [facility] ([facility]) [address] [place], [place] [phone] Accepts Medicaid  [facility] [address]  [place] [phone] Substance Abuse/Addiction Treatment   [facility]  Resources Organization         Address  Phone  Notes  [facility]  [phone]   Domenic Schwab, PhD [address] Arlis [place], [place]   [phone] or [phone]   [place], [place] [phone]   [address], [place], [place] [phone] Insurance/Medicaid/sponsorship through [facility] and Families [address], [address]                                    [place], [place] [phone] [place] [address], [place] [phone]    Dr. Adele Schilder  [phone]   [facility] Dept. 1) [address], [place] 2) [place] 3)  [address], [place] [phone]  [phone]   [place] [phone] or [phone] (After Hours)

## 2013-08-12 NOTE — ED Provider Notes (Signed)
Medical screening examination/treatment/procedure(s) were performed by non-physician practitioner and as supervising physician I was immediately available for consultation/collaboration.  Jerad Dunlap L Georgenia Salim, MD 08/12/13 0857 

## 2014-02-02 ENCOUNTER — Encounter (HOSPITAL_COMMUNITY): Payer: Self-pay | Admitting: Emergency Medicine

## 2015-11-16 ENCOUNTER — Encounter (HOSPITAL_BASED_OUTPATIENT_CLINIC_OR_DEPARTMENT_OTHER): Payer: Self-pay | Admitting: *Deleted

## 2015-11-16 ENCOUNTER — Emergency Department (HOSPITAL_BASED_OUTPATIENT_CLINIC_OR_DEPARTMENT_OTHER): Payer: Self-pay

## 2015-11-16 ENCOUNTER — Inpatient Hospital Stay (HOSPITAL_BASED_OUTPATIENT_CLINIC_OR_DEPARTMENT_OTHER)
Admission: EM | Admit: 2015-11-16 | Discharge: 2015-12-02 | DRG: 871 | Disposition: A | Discharge status: Skilled Nursing Facility | Payer: Self-pay | Attending: Internal Medicine | Admitting: Internal Medicine

## 2015-11-16 DIAGNOSIS — F1193 Opioid use, unspecified with withdrawal: Secondary | ICD-10-CM | POA: Diagnosis present

## 2015-11-16 DIAGNOSIS — I330 Acute and subacute infective endocarditis: Secondary | ICD-10-CM | POA: Diagnosis present

## 2015-11-16 DIAGNOSIS — A419 Sepsis, unspecified organism: Secondary | ICD-10-CM | POA: Diagnosis present

## 2015-11-16 DIAGNOSIS — N179 Acute kidney failure, unspecified: Secondary | ICD-10-CM

## 2015-11-16 DIAGNOSIS — F1120 Opioid dependence, uncomplicated: Secondary | ICD-10-CM | POA: Diagnosis present

## 2015-11-16 DIAGNOSIS — E440 Moderate protein-calorie malnutrition: Secondary | ICD-10-CM | POA: Diagnosis present

## 2015-11-16 DIAGNOSIS — E876 Hypokalemia: Secondary | ICD-10-CM | POA: Diagnosis not present

## 2015-11-16 DIAGNOSIS — J189 Pneumonia, unspecified organism: Secondary | ICD-10-CM

## 2015-11-16 DIAGNOSIS — B952 Enterococcus as the cause of diseases classified elsewhere: Secondary | ICD-10-CM | POA: Diagnosis present

## 2015-11-16 DIAGNOSIS — K089 Disorder of teeth and supporting structures, unspecified: Secondary | ICD-10-CM

## 2015-11-16 DIAGNOSIS — D638 Anemia in other chronic diseases classified elsewhere: Secondary | ICD-10-CM | POA: Diagnosis present

## 2015-11-16 DIAGNOSIS — J984 Other disorders of lung: Secondary | ICD-10-CM

## 2015-11-16 DIAGNOSIS — E871 Hypo-osmolality and hyponatremia: Secondary | ICD-10-CM | POA: Diagnosis present

## 2015-11-16 DIAGNOSIS — R64 Cachexia: Secondary | ICD-10-CM | POA: Diagnosis present

## 2015-11-16 DIAGNOSIS — R918 Other nonspecific abnormal finding of lung field: Secondary | ICD-10-CM

## 2015-11-16 DIAGNOSIS — R7881 Bacteremia: Secondary | ICD-10-CM | POA: Diagnosis present

## 2015-11-16 DIAGNOSIS — B9561 Methicillin susceptible Staphylococcus aureus infection as the cause of diseases classified elsewhere: Secondary | ICD-10-CM | POA: Diagnosis present

## 2015-11-16 DIAGNOSIS — E875 Hyperkalemia: Secondary | ICD-10-CM | POA: Diagnosis not present

## 2015-11-16 DIAGNOSIS — D6959 Other secondary thrombocytopenia: Secondary | ICD-10-CM | POA: Diagnosis present

## 2015-11-16 DIAGNOSIS — R9431 Abnormal electrocardiogram [ECG] [EKG]: Secondary | ICD-10-CM | POA: Diagnosis present

## 2015-11-16 DIAGNOSIS — I368 Other nonrheumatic tricuspid valve disorders: Secondary | ICD-10-CM

## 2015-11-16 DIAGNOSIS — I2690 Septic pulmonary embolism without acute cor pulmonale: Secondary | ICD-10-CM | POA: Diagnosis present

## 2015-11-16 DIAGNOSIS — A4181 Sepsis due to Enterococcus: Secondary | ICD-10-CM | POA: Diagnosis present

## 2015-11-16 DIAGNOSIS — F1910 Other psychoactive substance abuse, uncomplicated: Secondary | ICD-10-CM

## 2015-11-16 DIAGNOSIS — Z681 Body mass index (BMI) 19 or less, adult: Secondary | ICD-10-CM

## 2015-11-16 DIAGNOSIS — E861 Hypovolemia: Secondary | ICD-10-CM | POA: Diagnosis present

## 2015-11-16 DIAGNOSIS — F17210 Nicotine dependence, cigarettes, uncomplicated: Secondary | ICD-10-CM | POA: Diagnosis present

## 2015-11-16 DIAGNOSIS — D696 Thrombocytopenia, unspecified: Secondary | ICD-10-CM | POA: Diagnosis present

## 2015-11-16 DIAGNOSIS — R0682 Tachypnea, not elsewhere classified: Secondary | ICD-10-CM

## 2015-11-16 DIAGNOSIS — I079 Rheumatic tricuspid valve disease, unspecified: Secondary | ICD-10-CM | POA: Diagnosis present

## 2015-11-16 DIAGNOSIS — R0600 Dyspnea, unspecified: Secondary | ICD-10-CM

## 2015-11-16 DIAGNOSIS — E43 Unspecified severe protein-calorie malnutrition: Secondary | ICD-10-CM | POA: Insufficient documentation

## 2015-11-16 DIAGNOSIS — A4101 Sepsis due to Methicillin susceptible Staphylococcus aureus: Principal | ICD-10-CM | POA: Diagnosis present

## 2015-11-16 DIAGNOSIS — R042 Hemoptysis: Secondary | ICD-10-CM | POA: Diagnosis present

## 2015-11-16 DIAGNOSIS — I959 Hypotension, unspecified: Secondary | ICD-10-CM | POA: Diagnosis present

## 2015-11-16 DIAGNOSIS — D649 Anemia, unspecified: Secondary | ICD-10-CM | POA: Diagnosis present

## 2015-11-16 DIAGNOSIS — E872 Acidosis: Secondary | ICD-10-CM | POA: Diagnosis present

## 2015-11-16 HISTORY — DX: Opioid abuse, uncomplicated: F11.10

## 2015-11-16 HISTORY — DX: Other psychoactive substance abuse, uncomplicated: F19.10

## 2015-11-16 HISTORY — DX: Pneumonia, unspecified organism: J18.9

## 2015-11-16 HISTORY — DX: Unspecified asthma, uncomplicated: J45.909

## 2015-11-16 LAB — I-STAT ARTERIAL BLOOD GAS, ED
ACID-BASE DEFICIT: 5 mmol/L — AB (ref 0.0–2.0)
BICARBONATE: 17.9 meq/L — AB (ref 20.0–24.0)
O2 SAT: 94 %
PH ART: 7.421 (ref 7.350–7.450)
PO2 ART: 68 mmHg — AB (ref 80.0–100.0)
Patient temperature: 99.1
TCO2: 19 mmol/L (ref 0–100)
pCO2 arterial: 27.6 mmHg — ABNORMAL LOW (ref 35.0–45.0)

## 2015-11-16 LAB — CBC WITH DIFFERENTIAL/PLATELET
BASOS PCT: 0 %
Band Neutrophils: 19 %
Basophils Absolute: 0 10*3/uL (ref 0.0–0.1)
Eosinophils Absolute: 0 10*3/uL (ref 0.0–0.7)
Eosinophils Relative: 0 %
HEMATOCRIT: 30.9 % — AB (ref 36.0–46.0)
Hemoglobin: 10.9 g/dL — ABNORMAL LOW (ref 12.0–15.0)
LYMPHS PCT: 8 %
Lymphs Abs: 0.7 10*3/uL (ref 0.7–4.0)
MCH: 26.7 pg (ref 26.0–34.0)
MCHC: 35.3 g/dL (ref 30.0–36.0)
MCV: 75.6 fL — AB (ref 78.0–100.0)
MONOS PCT: 10 %
Monocytes Absolute: 0.9 10*3/uL (ref 0.1–1.0)
Neutro Abs: 7.3 10*3/uL (ref 1.7–7.7)
Neutrophils Relative %: 63 %
Platelets: 30 10*3/uL — ABNORMAL LOW (ref 150–400)
RBC: 4.09 MIL/uL (ref 3.87–5.11)
RDW: 16.9 % — ABNORMAL HIGH (ref 11.5–15.5)
WBC: 8.9 10*3/uL (ref 4.0–10.5)

## 2015-11-16 LAB — URINE MICROSCOPIC-ADD ON

## 2015-11-16 LAB — RAPID URINE DRUG SCREEN, HOSP PERFORMED
AMPHETAMINES: NOT DETECTED
BENZODIAZEPINES: POSITIVE — AB
Barbiturates: NOT DETECTED
Cocaine: POSITIVE — AB
OPIATES: POSITIVE — AB
Tetrahydrocannabinol: NOT DETECTED

## 2015-11-16 LAB — URINALYSIS, ROUTINE W REFLEX MICROSCOPIC
Bilirubin Urine: NEGATIVE
Glucose, UA: NEGATIVE mg/dL
KETONES UR: NEGATIVE mg/dL
Nitrite: NEGATIVE
Protein, ur: 100 mg/dL — AB
Specific Gravity, Urine: 1.015 (ref 1.005–1.030)
pH: 5 (ref 5.0–8.0)

## 2015-11-16 LAB — HEPATIC FUNCTION PANEL
ALK PHOS: 129 U/L — AB (ref 38–126)
ALT: 12 U/L — AB (ref 14–54)
AST: 33 U/L (ref 15–41)
Albumin: 2.1 g/dL — ABNORMAL LOW (ref 3.5–5.0)
BILIRUBIN DIRECT: 0.3 mg/dL (ref 0.1–0.5)
BILIRUBIN INDIRECT: 0.4 mg/dL (ref 0.3–0.9)
BILIRUBIN TOTAL: 0.7 mg/dL (ref 0.3–1.2)
Total Protein: 6.5 g/dL (ref 6.5–8.1)

## 2015-11-16 LAB — BASIC METABOLIC PANEL
Anion gap: 16 — ABNORMAL HIGH (ref 5–15)
BUN: 59 mg/dL — ABNORMAL HIGH (ref 6–20)
CO2: 19 mmol/L — AB (ref 22–32)
Calcium: 6.4 mg/dL — CL (ref 8.9–10.3)
Chloride: 82 mmol/L — ABNORMAL LOW (ref 101–111)
Creatinine, Ser: 4.43 mg/dL — ABNORMAL HIGH (ref 0.44–1.00)
GFR calc Af Amer: 15 mL/min — ABNORMAL LOW (ref >60)
GFR calc non Af Amer: 13 mL/min — ABNORMAL LOW (ref >60)
GLUCOSE: 107 mg/dL — AB (ref 65–99)
Potassium: 3.8 mmol/L (ref 3.5–5.1)
Sodium: 117 mmol/L — CL (ref 135–145)

## 2015-11-16 LAB — CK: Total CK: 17 U/L — ABNORMAL LOW (ref 38–234)

## 2015-11-16 LAB — I-STAT CG4 LACTIC ACID, ED
Lactic Acid, Venous: 0.99 mmol/L (ref 0.5–1.9)
Lactic Acid, Venous: 2.16 mmol/L (ref 0.5–1.9)

## 2015-11-16 LAB — TROPONIN I: Troponin I: 0.04 ng/mL (ref <0.03)

## 2015-11-16 LAB — BRAIN NATRIURETIC PEPTIDE: B Natriuretic Peptide: 215.3 pg/mL — ABNORMAL HIGH (ref 0.0–100.0)

## 2015-11-16 LAB — D-DIMER, QUANTITATIVE (NOT AT ARMC): D DIMER QUANT: 6.3 ug{FEU}/mL — AB (ref 0.00–0.50)

## 2015-11-16 LAB — PREGNANCY, URINE: Preg Test, Ur: NEGATIVE

## 2015-11-16 MED ORDER — ACETAMINOPHEN 325 MG PO TABS
650.0000 mg | ORAL_TABLET | Freq: Once | ORAL | Status: AC | PRN
  Administered 2015-11-16: 650 mg via ORAL

## 2015-11-16 MED ORDER — SODIUM CHLORIDE 0.9 % IV SOLN
1.0000 g | Freq: Once | INTRAVENOUS | Status: AC
  Administered 2015-11-16: 1 g via INTRAVENOUS
  Filled 2015-11-16: qty 10

## 2015-11-16 MED ORDER — PIPERACILLIN-TAZOBACTAM 3.375 G IVPB 30 MIN
3.3750 g | Freq: Once | INTRAVENOUS | Status: AC
  Administered 2015-11-16: 3.375 g via INTRAVENOUS
  Filled 2015-11-16 (×2): qty 50

## 2015-11-16 MED ORDER — VANCOMYCIN HCL IN DEXTROSE 1-5 GM/200ML-% IV SOLN
1000.0000 mg | Freq: Once | INTRAVENOUS | Status: AC
  Administered 2015-11-16: 1000 mg via INTRAVENOUS
  Filled 2015-11-16: qty 200

## 2015-11-16 MED ORDER — ACETAMINOPHEN 325 MG PO TABS
ORAL_TABLET | ORAL | Status: AC
  Filled 2015-11-16: qty 2

## 2015-11-16 MED ORDER — SODIUM CHLORIDE 0.9 % IV BOLUS (SEPSIS)
200.0000 mL | Freq: Once | INTRAVENOUS | Status: AC
  Administered 2015-11-16: 200 mL via INTRAVENOUS

## 2015-11-16 MED ORDER — SODIUM CHLORIDE 0.9 % IV BOLUS (SEPSIS)
1000.0000 mL | Freq: Once | INTRAVENOUS | Status: AC
  Administered 2015-11-16: 1000 mL via INTRAVENOUS

## 2015-11-16 NOTE — ED Triage Notes (Signed)
Sob. Cough. She looks like she has been sick for a long time. Pale, under weight. Dental caries. 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 She is alert.

## 2015-11-16 NOTE — ED Provider Notes (Signed)
[facility] Provider Note   CSN: [id number] Arrival date & time: 11/16/15  1954  By signing my name below, I, Modena Jansky, attest that this documentation has been prepared under the direction and in the presence of non-physician practitioner, Harolyn Rutherford, PA-C. Electronically Signed: Modena Jansky, Scribe. 11/16/2015. 8:23 PM.   History   Chief Complaint Chief Complaint  Patient presents with  . Shortness of Breath    The history is provided by the patient. No language interpreter was used.  HPI Comments: Catherine Hernandez is a 30 y.o. female with a hx of asthma and heroine use who presents to the Emergency Department complaining of an intermittent moderate cough that started 2 days ago. She states that her cough with associated SOB has been productive of clear phlegm, and reports constant moderate back and rib pain from the cough. She states that she last used heroine 2 months ago. She reports having a hx of smoking, and smokes 0.5 PPD. She denies any fever, N/VD, abdominal pain, rashes, neck stiffness, or any other complaints. Further denies recent surgeries/trauma, prolonged immobilization, hx of blood clots, or hx of cancer.    PCP: No PCP Per Patient  Past Medical History:  Diagnosis Date  . Asthma    AS A CHILD  . Heroin abuse   . Polysubstance abuse     Patient Active Problem List   Diagnosis Date Noted  . Sepsis (HCC) 11/16/2015  . Prolonged pregnancy, antepartum 11/11/2010    History reviewed. No pertinent surgical history.  OB History    Gravida Para Term Preterm AB Living   1 1 1  0 0 1   SAB TAB Ectopic Multiple Live Births   0 0 0 0 1       Home Medications    Prior to Admission medications   Medication Sig Start Date End Date Taking? Authorizing Provider  clindamycin (CLEOCIN) 300 MG capsule Take 1 capsule (300 mg total) by mouth 4 (four) times daily. X 7 days 08/07/13   Junious Silk, PA-C  dicyclomine (BENTYL) 20 MG tablet Take 1 tablet (20  mg total) by mouth 2 (two) times daily. 04/10/13   Glynn Octave, MD  ibuprofen (ADVIL,MOTRIN) 200 MG tablet Take 200 mg by mouth every 6 (six) hours as needed for moderate pain.    Historical Provider, MD  ondansetron (ZOFRAN) 4 MG tablet Take 1 tablet (4 mg total) by mouth every 6 (six) hours. 04/10/13   Glynn Octave, MD  oxyCODONE-acetaminophen (PERCOCET/ROXICET) 5-325 MG per tablet Take 1-2 tablets by mouth every 6 (six) hours as needed for severe pain. 08/07/13   Junious Silk, PA-C  promethazine (PHENERGAN) 25 MG tablet Take 1 tablet (25 mg total) by mouth every 6 (six) hours as needed for nausea or vomiting. 08/07/13   Junious Silk, PA-C    Family History Family History  Problem Relation Age of Onset  . Anesthesia problems Neg Hx   . Hypotension Neg Hx   . Malignant hyperthermia Neg Hx   . Pseudochol deficiency Neg Hx     Social History Social History  Substance Use Topics  . Smoking status: Current Some Day Smoker    Packs/day: 0.25    Types: Cigarettes  . Smokeless tobacco: Never Used  . Alcohol use No     Allergies   Review of patient's allergies indicates no known allergies.   Review of Systems Review of Systems  Constitutional: Negative for fever.  Respiratory: Positive for cough and shortness of breath.  Gastrointestinal: Negative for nausea.  Musculoskeletal: Positive for back pain (Secondary to cough).       Rib pain from coughing  All other systems reviewed and are negative.    Physical Exam Updated Vital Signs BP (!) 87/57   Pulse (!) 135   Temp 99.1 F (37.3 C) (Oral)   Resp (!) 34   Ht 5\' 5"  (1.651 m)   Wt 87 lb (39.5 kg)   SpO2 95%   BMI 14.48 kg/m   Physical Exam  Constitutional:  Ill appearing. Cachectic.  HENT:  Head: Normocephalic and atraumatic.  Eyes: Conjunctivae are normal.  Neck: Neck supple.  Cardiovascular: Normal rate, regular rhythm, normal heart sounds and intact distal pulses.   Pulmonary/Chest:  Increased work of  breathing. Only able to speak in short phrases. Breaths sounds clear.   Abdominal: Soft. There is no tenderness. There is no guarding.  Musculoskeletal: She exhibits no edema or tenderness.  Lymphadenopathy:    She has no cervical adenopathy.  Neurological: She is alert.  Skin: Skin is warm and dry.  Psychiatric: She has a normal mood and affect. Her behavior is normal.  Nursing note and vitals reviewed.    ED Treatments / Results  DIAGNOSTIC STUDIES: Oxygen Saturation is 95% on RA, normal by my interpretation.    COORDINATION OF CARE: 8:27 PM- Pt advised of plan for treatment, which includes medication, radiology, and labs, and pt agrees.  Labs (all labs ordered are listed, but only abnormal results are displayed) Labs Reviewed  BASIC METABOLIC PANEL - Abnormal; Notable for the following:       Result Value   Sodium 117 (*)    Chloride 82 (*)    CO2 19 (*)    Glucose, Bld 107 (*)    BUN 59 (*)    Creatinine, Ser 4.43 (*)    Calcium 6.4 (*)    GFR calc non Af Amer 13 (*)    GFR calc Af Amer 15 (*)    Anion gap 16 (*)    All other components within normal limits  CBC WITH DIFFERENTIAL/PLATELET - Abnormal; Notable for the following:    Hemoglobin 10.9 (*)    HCT 30.9 (*)    MCV 75.6 (*)    RDW 16.9 (*)    Platelets 30 (*)    All other components within normal limits  BRAIN NATRIURETIC PEPTIDE - Abnormal; Notable for the following:    B Natriuretic Peptide 215.3 (*)    All other components within normal limits  TROPONIN I - Abnormal; Notable for the following:    Troponin I 0.04 (*)    All other components within normal limits  D-DIMER, QUANTITATIVE (NOT AT [facility]) - Abnormal; Notable for the following:    D-Dimer, Quant 6.30 (*)    All other components within normal limits  URINALYSIS, ROUTINE W REFLEX MICROSCOPIC (NOT AT [facility]) - Abnormal; Notable for the following:    APPearance TURBID (*)    Hgb urine dipstick SMALL (*)    Protein, ur 100 (*)    Leukocytes, UA  SMALL (*)    All other components within normal limits  URINE RAPID DRUG SCREEN, HOSP PERFORMED - Abnormal; Notable for the following:    Opiates POSITIVE (*)    Cocaine POSITIVE (*)    Benzodiazepines POSITIVE (*)    All other components within normal limits  URINE MICROSCOPIC-ADD ON - Abnormal; Notable for the following:    Squamous Epithelial / LPF 6-30 (*)  Bacteria, UA FEW (*)    Casts GRANULAR CAST (*)    All other components within normal limits  I-STAT CG4 LACTIC ACID, ED - Abnormal; Notable for the following:    Lactic Acid, Venous 2.16 (*)    All other components within normal limits  I-STAT ARTERIAL BLOOD GAS, ED - Abnormal; Notable for the following:    pCO2 arterial 27.6 (*)    pO2, Arterial 68.0 (*)    Bicarbonate 17.9 (*)    Acid-base deficit 5.0 (*)    All other components within normal limits  CULTURE, BLOOD (ROUTINE X 2)  CULTURE, BLOOD (ROUTINE X 2)  URINE CULTURE  PREGNANCY, URINE  HIV ANTIBODY (ROUTINE TESTING)  BLOOD GAS, ARTERIAL  CK  HEPATIC FUNCTION PANEL  I-STAT CG4 LACTIC ACID, ED   Hemoglobin  Date Value Ref Range Status  11/16/2015 10.9 (L) 12.0 - 15.0 g/dL Final  54/09/811901/10/2013 14.714.9 12.0 - 15.0 g/dL Final  82/95/621310/09/2011 08.614.4 12.0 - 15.0 g/dL Final  57/84/696208/14/2012 95.211.7 (L) 12.0 - 15.0 g/dL Final    BUN  Date Value Ref Range Status  11/16/2015 59 (H) 6 - 20 mg/dL Final  84/13/244001/10/2013 22 6 - 23 mg/dL Final  10/27/253610/09/2011 5 (L) 6 - 23 mg/dL Final   Creatinine, Ser  Date Value Ref Range Status  11/16/2015 4.43 (H) 0.44 - 1.00 mg/dL Final  64/40/347401/10/2013 2.591.01 0.50 - 1.10 mg/dL Final  56/38/756410/09/2011 3.320.53 0.50 - 1.10 mg/dL Final     EKG  EKG Interpretation None       Radiology Dg Chest 2 View  Result Date: 11/16/2015 CLINICAL DATA:  Intermittent moderate cough beginning 2 days ago. Shortness breath. Moderate back and rib pain. Next healed EXAM: CHEST  2 VIEW COMPARISON:  Two-view chest x-ray 04/10/2013. FINDINGS: Multi focal right lower lobe airspace  disease is present. Additional right upper lobe airspace disease present. There is prominence of the hila bilaterally. Linear densities are noted at the left base. The heart size is normal. The visualized soft tissues and bony thorax are unremarkable. IMPRESSION: 1. Multi focal pneumonia involving the right lower lobe, right upper lobe, and possibly left lower lobe. 2. Moderate hilar prominence likely representing vascular congestion neck and possible reactive adenopathy. 3. Recommend follow-up two-view chest x-ray following appropriate treatment therapy. Electronically Signed   By: Marin Robertshristopher  Mattern M.D.   On: 11/16/2015 21:32    Procedures Procedures (including critical care time)  Medications Ordered in ED Medications  calcium gluconate 1 g in sodium chloride 0.9 % 100 mL IVPB (not administered)  sodium chloride 0.9 % bolus 1,000 mL (0 mLs Intravenous Stopped 11/16/15 2133)  sodium chloride 0.9 % bolus 200 mL (0 mLs Intravenous Stopped 11/16/15 2145)  piperacillin-tazobactam (ZOSYN) IVPB 3.375 g (0 g Intravenous Stopped 11/16/15 2222)  vancomycin (VANCOCIN) IVPB 1000 mg/200 mL premix (1,000 mg Intravenous New Bag/Given 11/16/15 2221)     Initial Impression / Assessment and Plan / ED Course  I have reviewed the triage vital signs and the nursing notes.  Pertinent labs & imaging results that were available during my care of the patient were reviewed by me and considered in my medical decision making (see chart for details).  Clinical Course    Margretta Dittyngela N Uselman presents with shortness of breath and signs of systemic infection for the last 2 days.  Findings and plan of care discussed with Loren Raceravid Yelverton, MD. Dr. Ranae PalmsYelverton personally evaluated and examined this patient.  Patient is ill-appearing, short of breath, tachycardic, and has a  lactic acidosis. Code sepsis initiated. Furthermore, patient has acute renal failure, hyponatremia, and hypocalcemia. Steps were taken to address these issues  as well. Repeat sepsis assessment completed. Patient's pulse seems to be responding to IV fluids. Breathing also improved with fluid resuscitation. Patient to be transferred to [facility]. Dr. Ophelia Charter, hospitalist, agreed to admit the patient to inpatient stepdown.   Vitals:   11/16/15 2001 11/16/15 2004 11/16/15 2006 11/16/15 2030  BP:  (!) 87/57  (!) 89/76  Pulse:  (!) 135  (!) 132  Resp:  (!) 34  23  Temp:  99.1 F (37.3 C)    TempSrc:  Oral    SpO2:  95%  95%  Weight:   39.5 kg   Height: 5\' 5"  (1.651 m)      Vitals:   11/16/15 2215 11/16/15 2230 11/16/15 2245 11/16/15 2300  BP: 102/62 105/60 (!) 100/54 90/59  Pulse: 108 112 108 105  Resp: (!) 28 (!) 30 (!) 32 23  Temp:      TempSrc:      SpO2: 96% 97% 95% 96%  Weight:      Height:         Final Clinical Impressions(s) / ED Diagnoses   Final diagnoses:  CAP (community acquired pneumonia)  Polysubstance abuse  AKI (acute kidney injury) (HCC)  Hyponatremia    New Prescriptions New Prescriptions   No medications on file   I personally performed the services described in this documentation, which was scribed in my presence. The recorded information has been reviewed and is accurate.    Anselm Pancoast, PA-C 11/16/15 2328    Loren Racer, MD 11/20/15 [phone]

## 2015-11-16 NOTE — Progress Notes (Addendum)
[facility] from [facility].  H/o asthma.  Remarkable findings/labs include: -Tachycardia to 135, RR 34, BP 87/57, lactate 2.16 - Code Sepsis called and protocol initiated including 30 cc/kg bolus and Vanc/Zosyn given. -Hyponatremia, sodium 117+ - likely hypovolemic hyponatremia -AKI with creatinine 4.43 - likely pre-renal azotemia -Polysubstance abuse, UDS positive for opiates (thought to be heroin), cocaine, BZD - patient denies use.  Patient accepted to SDU and will be seen upon arrival by one of the admitting physicians.  Georgana CurioJennifer E. Allister Lessley, M.D.

## 2015-11-17 ENCOUNTER — Encounter (HOSPITAL_COMMUNITY): Payer: Self-pay | Admitting: *Deleted

## 2015-11-17 ENCOUNTER — Inpatient Hospital Stay (HOSPITAL_COMMUNITY): Payer: Self-pay

## 2015-11-17 DIAGNOSIS — J984 Other disorders of lung: Secondary | ICD-10-CM

## 2015-11-17 DIAGNOSIS — D696 Thrombocytopenia, unspecified: Secondary | ICD-10-CM | POA: Diagnosis present

## 2015-11-17 DIAGNOSIS — N179 Acute kidney failure, unspecified: Secondary | ICD-10-CM | POA: Insufficient documentation

## 2015-11-17 DIAGNOSIS — R9431 Abnormal electrocardiogram [ECG] [EKG]: Secondary | ICD-10-CM | POA: Diagnosis present

## 2015-11-17 DIAGNOSIS — E871 Hypo-osmolality and hyponatremia: Secondary | ICD-10-CM | POA: Diagnosis present

## 2015-11-17 DIAGNOSIS — F1193 Opioid use, unspecified with withdrawal: Secondary | ICD-10-CM | POA: Diagnosis present

## 2015-11-17 DIAGNOSIS — F1120 Opioid dependence, uncomplicated: Secondary | ICD-10-CM | POA: Diagnosis present

## 2015-11-17 DIAGNOSIS — F1910 Other psychoactive substance abuse, uncomplicated: Secondary | ICD-10-CM | POA: Insufficient documentation

## 2015-11-17 LAB — CBC
HEMATOCRIT: 24.3 % — AB (ref 36.0–46.0)
HEMOGLOBIN: 8 g/dL — AB (ref 12.0–15.0)
MCH: 25.6 pg — AB (ref 26.0–34.0)
MCHC: 32.9 g/dL (ref 30.0–36.0)
MCV: 77.6 fL — AB (ref 78.0–100.0)
Platelets: 34 10*3/uL — ABNORMAL LOW (ref 150–400)
RBC: 3.13 MIL/uL — AB (ref 3.87–5.11)
RDW: 17 % — ABNORMAL HIGH (ref 11.5–15.5)
WBC: 8.3 10*3/uL (ref 4.0–10.5)

## 2015-11-17 LAB — BASIC METABOLIC PANEL
ANION GAP: 14 (ref 5–15)
ANION GAP: 11 (ref 5–15)
ANION GAP: 10 (ref 5–15)
Anion gap: 16 — ABNORMAL HIGH (ref 5–15)
Anion gap: 12 (ref 5–15)
BUN: 55 mg/dL — AB (ref 6–20)
BUN: 58 mg/dL — AB (ref 6–20)
BUN: 59 mg/dL — AB (ref 6–20)
BUN: 63 mg/dL — ABNORMAL HIGH (ref 6–20)
BUN: 68 mg/dL — ABNORMAL HIGH (ref 6–20)
CALCIUM: 6.7 mg/dL — AB (ref 8.9–10.3)
CHLORIDE: 88 mmol/L — AB (ref 101–111)
CHLORIDE: 92 mmol/L — AB (ref 101–111)
CHLORIDE: 96 mmol/L — AB (ref 101–111)
CHLORIDE: 102 mmol/L (ref 101–111)
CO2: 17 mmol/L — AB (ref 22–32)
CO2: 15 mmol/L — AB (ref 22–32)
CO2: 17 mmol/L — AB (ref 22–32)
CO2: 16 mmol/L — AB (ref 22–32)
CO2: 14 mmol/L — AB (ref 22–32)
CREATININE: 4.53 mg/dL — AB (ref 0.44–1.00)
Calcium: 6.8 mg/dL — ABNORMAL LOW (ref 8.9–10.3)
Calcium: 6.6 mg/dL — ABNORMAL LOW (ref 8.9–10.3)
Calcium: 6.6 mg/dL — ABNORMAL LOW (ref 8.9–10.3)
Calcium: 6.7 mg/dL — ABNORMAL LOW (ref 8.9–10.3)
Chloride: 93 mmol/L — ABNORMAL LOW (ref 101–111)
Creatinine, Ser: 4.43 mg/dL — ABNORMAL HIGH (ref 0.44–1.00)
Creatinine, Ser: 4.46 mg/dL — ABNORMAL HIGH (ref 0.44–1.00)
Creatinine, Ser: 4.44 mg/dL — ABNORMAL HIGH (ref 0.44–1.00)
Creatinine, Ser: 4.44 mg/dL — ABNORMAL HIGH (ref 0.44–1.00)
GFR calc Af Amer: 15 mL/min — ABNORMAL LOW (ref >60)
GFR calc Af Amer: 14 mL/min — ABNORMAL LOW (ref >60)
GFR calc Af Amer: 14 mL/min — ABNORMAL LOW (ref >60)
GFR calc non Af Amer: 13 mL/min — ABNORMAL LOW (ref >60)
GFR calc non Af Amer: 12 mL/min — ABNORMAL LOW (ref >60)
GFR calc non Af Amer: 13 mL/min — ABNORMAL LOW (ref >60)
GFR calc non Af Amer: 13 mL/min — ABNORMAL LOW (ref >60)
GFR, EST AFRICAN AMERICAN: 15 mL/min — AB (ref >60)
GFR, EST AFRICAN AMERICAN: 15 mL/min — AB (ref >60)
GFR, EST NON AFRICAN AMERICAN: 12 mL/min — AB (ref >60)
GLUCOSE: 91 mg/dL (ref 65–99)
GLUCOSE: 93 mg/dL (ref 65–99)
GLUCOSE: 101 mg/dL — AB (ref 65–99)
GLUCOSE: 126 mg/dL — AB (ref 65–99)
GLUCOSE: 130 mg/dL — AB (ref 65–99)
POTASSIUM: 2.7 mmol/L — AB (ref 3.5–5.1)
POTASSIUM: 3.5 mmol/L (ref 3.5–5.1)
POTASSIUM: 3.2 mmol/L — AB (ref 3.5–5.1)
POTASSIUM: 4 mmol/L (ref 3.5–5.1)
POTASSIUM: 5.5 mmol/L — AB (ref 3.5–5.1)
SODIUM: 121 mmol/L — AB (ref 135–145)
Sodium: 122 mmol/L — ABNORMAL LOW (ref 135–145)
Sodium: 121 mmol/L — ABNORMAL LOW (ref 135–145)
Sodium: 123 mmol/L — ABNORMAL LOW (ref 135–145)
Sodium: 126 mmol/L — ABNORMAL LOW (ref 135–145)

## 2015-11-17 LAB — BLOOD CULTURE ID PANEL (REFLEXED)
ACINETOBACTER BAUMANNII: NOT DETECTED
CANDIDA GLABRATA: NOT DETECTED
Candida albicans: NOT DETECTED
Candida krusei: NOT DETECTED
Candida parapsilosis: NOT DETECTED
Candida tropicalis: NOT DETECTED
Carbapenem resistance: NOT DETECTED
ENTEROBACTER CLOACAE COMPLEX: NOT DETECTED
ENTEROBACTERIACEAE SPECIES: NOT DETECTED
ESCHERICHIA COLI: NOT DETECTED
Enterococcus species: DETECTED — AB
HAEMOPHILUS INFLUENZAE: NOT DETECTED
Klebsiella oxytoca: NOT DETECTED
Klebsiella pneumoniae: NOT DETECTED
LISTERIA MONOCYTOGENES: NOT DETECTED
METHICILLIN RESISTANCE: NOT DETECTED
NEISSERIA MENINGITIDIS: NOT DETECTED
PSEUDOMONAS AERUGINOSA: NOT DETECTED
Proteus species: NOT DETECTED
SERRATIA MARCESCENS: NOT DETECTED
STREPTOCOCCUS AGALACTIAE: NOT DETECTED
STREPTOCOCCUS PYOGENES: NOT DETECTED
STREPTOCOCCUS SPECIES: NOT DETECTED
Staphylococcus aureus (BCID): DETECTED — AB
Staphylococcus species: DETECTED — AB
Streptococcus pneumoniae: NOT DETECTED
Vancomycin resistance: NOT DETECTED

## 2015-11-17 LAB — TROPONIN I
TROPONIN I: 0.03 ng/mL — AB (ref <0.03)
Troponin I: 0.06 ng/mL (ref <0.03)
Troponin I: <0.03 ng/mL (ref <0.03)

## 2015-11-17 LAB — OSMOLALITY: OSMOLALITY: 272 mosm/kg — AB (ref 275–295)

## 2015-11-17 LAB — CREATININE, URINE, RANDOM: CREATININE, URINE: 44.98 mg/dL

## 2015-11-17 LAB — APTT: APTT: 40 s — AB (ref 24–36)

## 2015-11-17 LAB — PROTIME-INR
INR: 1.45
Prothrombin Time: 17.8 seconds — ABNORMAL HIGH (ref 11.4–15.2)

## 2015-11-17 LAB — OSMOLALITY, URINE: OSMOLALITY UR: 177 mosm/kg — AB (ref 300–900)

## 2015-11-17 LAB — LACTIC ACID, PLASMA
LACTIC ACID, VENOUS: 0.8 mmol/L (ref 0.5–1.9)
Lactic Acid, Venous: 1 mmol/L (ref 0.5–1.9)
Lactic Acid, Venous: 1.6 mmol/L (ref 0.5–1.9)

## 2015-11-17 LAB — PROTEIN / CREATININE RATIO, URINE
Creatinine, Urine: 45.29 mg/dL
PROTEIN CREATININE RATIO: 1.24 mg/mg{creat} — AB (ref 0.00–0.15)
Total Protein, Urine: 56 mg/dL

## 2015-11-17 LAB — PROCALCITONIN: PROCALCITONIN: 18.52 ng/mL

## 2015-11-17 LAB — MAGNESIUM
MAGNESIUM: 2.1 mg/dL (ref 1.7–2.4)
Magnesium: 2.1 mg/dL (ref 1.7–2.4)

## 2015-11-17 LAB — MRSA PCR SCREENING: MRSA by PCR: POSITIVE — AB

## 2015-11-17 MED ORDER — ADULT MULTIVITAMIN W/MINERALS CH
1.0000 | ORAL_TABLET | Freq: Every day | ORAL | Status: DC
  Administered 2015-11-17 – 2015-12-02 (×16): 1 via ORAL
  Filled 2015-11-17 (×16): qty 1

## 2015-11-17 MED ORDER — MUPIROCIN 2 % EX OINT
1.0000 "application " | TOPICAL_OINTMENT | Freq: Two times a day (BID) | CUTANEOUS | Status: AC
  Administered 2015-11-17 – 2015-11-21 (×10): 1 via NASAL
  Filled 2015-11-17: qty 22

## 2015-11-17 MED ORDER — ACETAMINOPHEN 650 MG RE SUPP
650.0000 mg | Freq: Four times a day (QID) | RECTAL | Status: DC | PRN
  Administered 2015-11-22: 650 mg via RECTAL
  Filled 2015-11-17: qty 1

## 2015-11-17 MED ORDER — ONDANSETRON HCL 4 MG/2ML IJ SOLN: 4.0000 mg | Freq: Four times a day (QID) | INTRAMUSCULAR | Status: DC | PRN

## 2015-11-17 MED ORDER — THIAMINE HCL 100 MG/ML IJ SOLN
100.0000 mg | Freq: Every day | INTRAMUSCULAR | Status: DC
  Administered 2015-11-17 – 2015-11-19 (×3): 100 mg via INTRAVENOUS
  Filled 2015-11-17 (×3): qty 2

## 2015-11-17 MED ORDER — LORAZEPAM 2 MG/ML IJ SOLN
0.5000 mg | Freq: Four times a day (QID) | INTRAMUSCULAR | Status: DC | PRN
  Filled 2015-11-17: qty 1

## 2015-11-17 MED ORDER — LORAZEPAM 2 MG/ML IJ SOLN
0.5000 mg | Freq: Four times a day (QID) | INTRAMUSCULAR | Status: DC | PRN
  Administered 2015-11-17 – 2015-11-24 (×27): 0.5 mg via INTRAVENOUS
  Filled 2015-11-17 (×26): qty 1

## 2015-11-17 MED ORDER — ACETAMINOPHEN 325 MG PO TABS
650.0000 mg | ORAL_TABLET | Freq: Four times a day (QID) | ORAL | Status: DC | PRN
  Administered 2015-11-17 – 2015-11-30 (×4): 650 mg via ORAL
  Filled 2015-11-17 (×5): qty 2

## 2015-11-17 MED ORDER — CLONIDINE HCL 0.1 MG PO TABS: 0.1000 mg | ORAL_TABLET | Freq: Three times a day (TID) | ORAL | Status: DC | PRN

## 2015-11-17 MED ORDER — MUPIROCIN 2 % EX OINT
TOPICAL_OINTMENT | CUTANEOUS | Status: AC
  Administered 2015-11-17: 1 via NASAL
  Filled 2015-11-17: qty 22

## 2015-11-17 MED ORDER — HYDROCORTISONE NA SUCCINATE PF 100 MG IJ SOLR
100.0000 mg | Freq: Three times a day (TID) | INTRAMUSCULAR | Status: DC
  Administered 2015-11-17 – 2015-11-21 (×12): 100 mg via INTRAVENOUS
  Filled 2015-11-17 (×12): qty 2

## 2015-11-17 MED ORDER — SODIUM CHLORIDE 0.9 % IV SOLN
INTRAVENOUS | Status: DC
  Administered 2015-11-17: 11:00:00 via INTRAVENOUS
  Filled 2015-11-17 (×3): qty 1000

## 2015-11-17 MED ORDER — NICOTINE 14 MG/24HR TD PT24
14.0000 mg | MEDICATED_PATCH | Freq: Every day | TRANSDERMAL | Status: DC
  Administered 2015-11-17 – 2015-11-30 (×12): 14 mg via TRANSDERMAL
  Filled 2015-11-17 (×15): qty 1

## 2015-11-17 MED ORDER — ALBUTEROL SULFATE (2.5 MG/3ML) 0.083% IN NEBU: 2.5000 mg | INHALATION_SOLUTION | RESPIRATORY_TRACT | Status: DC | PRN

## 2015-11-17 MED ORDER — ONDANSETRON HCL 4 MG PO TABS
4.0000 mg | ORAL_TABLET | Freq: Four times a day (QID) | ORAL | Status: DC | PRN
Start: 2015-11-17 — End: 2015-12-02

## 2015-11-17 MED ORDER — TRAMADOL HCL 50 MG PO TABS
100.0000 mg | ORAL_TABLET | Freq: Two times a day (BID) | ORAL | Status: DC | PRN
  Administered 2015-11-17 – 2015-11-26 (×6): 100 mg via ORAL
  Filled 2015-11-17 (×7): qty 2

## 2015-11-17 MED ORDER — SODIUM CHLORIDE 0.9 % IV BOLUS (SEPSIS)
500.0000 mL | Freq: Once | INTRAVENOUS | Status: AC
  Administered 2015-11-17: 500 mL via INTRAVENOUS

## 2015-11-17 MED ORDER — TECHNETIUM TC 99M DIETHYLENETRIAME-PENTAACETIC ACID
30.3000 | Freq: Once | INTRAVENOUS | Status: DC | PRN
Start: 2015-11-17 — End: 2015-12-02

## 2015-11-17 MED ORDER — SODIUM CHLORIDE 0.9 % IV BOLUS (SEPSIS)
250.0000 mL | INTRAVENOUS | Status: DC | PRN
  Administered 2015-11-17 – 2015-12-02 (×2): 250 mL via INTRAVENOUS
  Filled 2015-11-17 (×2): qty 250

## 2015-11-17 MED ORDER — SODIUM CHLORIDE 0.9 % IV SOLN
INTRAVENOUS | Status: DC
Start: 2015-11-17 — End: 2015-11-17
  Administered 2015-11-17: 1000 mL via INTRAVENOUS

## 2015-11-17 MED ORDER — POTASSIUM CHLORIDE CRYS ER 20 MEQ PO TBCR
30.0000 meq | EXTENDED_RELEASE_TABLET | ORAL | Status: AC
  Administered 2015-11-17 (×2): 30 meq via ORAL
  Filled 2015-11-17 (×2): qty 1

## 2015-11-17 MED ORDER — VANCOMYCIN HCL 500 MG IV SOLR: 500.0000 mg | INTRAVENOUS | Status: DC

## 2015-11-17 MED ORDER — FOLIC ACID 5 MG/ML IJ SOLN
1.0000 mg | Freq: Every day | INTRAMUSCULAR | Status: DC
Start: 2015-11-17 — End: 2015-11-19
  Administered 2015-11-17 – 2015-11-19 (×3): 1 mg via INTRAVENOUS
  Filled 2015-11-17 (×5): qty 0.2

## 2015-11-17 MED ORDER — NEPRO/CARBSTEADY PO LIQD
237.0000 mL | Freq: Two times a day (BID) | ORAL | Status: DC
  Administered 2015-11-17 – 2015-11-25 (×5): 237 mL via ORAL
  Filled 2015-11-17 (×9): qty 237

## 2015-11-17 MED ORDER — LORAZEPAM 2 MG/ML IJ SOLN
1.0000 mg | Freq: Four times a day (QID) | INTRAMUSCULAR | Status: DC | PRN
Start: 2015-11-17 — End: 2015-11-17
  Administered 2015-11-17 (×2): 1 mg via INTRAVENOUS
  Filled 2015-11-17 (×2): qty 1

## 2015-11-17 MED ORDER — PIPERACILLIN-TAZOBACTAM IN DEX 2-0.25 GM/50ML IV SOLN
2.2500 g | Freq: Three times a day (TID) | INTRAVENOUS | Status: DC
  Administered 2015-11-17 (×2): 2.25 g via INTRAVENOUS
  Filled 2015-11-17 (×4): qty 50

## 2015-11-17 MED ORDER — LOPERAMIDE HCL 2 MG PO CAPS: 2.0000 mg | ORAL_CAPSULE | ORAL | Status: DC | PRN

## 2015-11-17 MED ORDER — SODIUM CHLORIDE 0.9% FLUSH
3.0000 mL | Freq: Two times a day (BID) | INTRAVENOUS | Status: DC
  Administered 2015-11-17 – 2015-11-30 (×17): 3 mL via INTRAVENOUS

## 2015-11-17 MED ORDER — METHOCARBAMOL 1000 MG/10ML IJ SOLN
500.0000 mg | Freq: Four times a day (QID) | INTRAMUSCULAR | Status: DC | PRN
  Administered 2015-11-17 – 2015-11-21 (×6): 500 mg via INTRAVENOUS
  Filled 2015-11-17 (×11): qty 5

## 2015-11-17 MED ORDER — TECHNETIUM TO 99M ALBUMIN AGGREGATED
4.0000 | Freq: Once | INTRAVENOUS | Status: AC | PRN
  Administered 2015-11-17: 4 via INTRAVENOUS

## 2015-11-17 MED ORDER — CHLORHEXIDINE GLUCONATE CLOTH 2 % EX PADS
6.0000 | MEDICATED_PAD | Freq: Every day | CUTANEOUS | Status: AC
  Administered 2015-11-17 – 2015-11-21 (×5): 6 via TOPICAL

## 2015-11-17 NOTE — H&P (Signed)
History and Physical    Catherine Hernandez ZOX:[id number] DOB: July 01, 1985 DOA: 11/16/2015  PCP: No PCP Per Patient Consultants:  None Patient coming from: home - lives with boyfriend; NOK: Catherine Hernandez, [phone]  Chief Complaint: cough  HPI: Catherine Hernandez is a 30 y.o. female with medical history significant of polysubstance dependence and asthma presenting with cough/SOB.  Patient reports that she started coughing which cause her back to start hurting.  Happened about 3 days ago.  +productive of clear, frothy sputum.  No known fevers.  ?sick contacts.  +wheezing, better now.  Diffuse myalgias.  Minimal PO intake, not nauseated just hard to breathe, chew, and swallow simultaneously.  Last heroin use was a couple of months ago (or a few weeks ago - inconsistent history).  Has taken pills more recently - "roxi, stuff like that."  Last cocaine use about 2 weeks ago.  No recent marijuana use.  Denies use of BZD.  Per nursing report, patient ran out of available injection sites - had even been shooting up in veins in her buttocks - and so unable to use over the last few days.  Patient states last use of any drugs was about 2 days ago.  Has been trying to quit smoking tobacco and using drugs.   ED Course: Per PA-C Joy: Patient is ill-appearing, short of breath, tachycardic, and has a lactic acidosis. Code sepsis initiated. Furthermore, patient has acute renal failure, hyponatremia, and hypocalcemia. Steps were taken to address these issues as well. Repeat sepsis assessment completed. Patient's pulse seems to be responding to IV fluids. Breathing also improved with fluid resuscitation. [facility] Dr. Ophelia CharterYates, hospitalist, agreed to admit the patient to inpatient stepdown.  Review of Systems: As per HPI; otherwise 10 point review of systems reviewed and negative.   Ambulatory Status: walks without assistance  Past Medical History:  Diagnosis Date  . Asthma    AS A  CHILD  . Heroin abuse   . Polysubstance abuse     History reviewed. No pertinent surgical history.  Social History   Social History  . Marital status: Single    Spouse name: N/A  . Number of children: N/A  . Years of education: N/A   Occupational History  . unemployed, former Film/video editorwarehouse laborer    Social History Main Topics  . Smoking status: Current Some Day Smoker    Packs/day: 0.50    Years: 10.00    Types: Cigarettes  . Smokeless tobacco: Never Used  . Alcohol use No  . Drug use:     Types: IV, Cocaine, Heroin, Oxycodone     Comment: opiates herion  . Sexual activity: Yes   Other Topics Concern  . Not on file   Social History Narrative  . No narrative on file    No Known Allergies  Family History  Problem Relation Age of Onset  . Anesthesia problems Neg Hx   . Hypotension Neg Hx   . Malignant hyperthermia Neg Hx   . Pseudochol deficiency Neg Hx     Prior to Admission medications   Medication Sig Start Date End Date Taking? Authorizing Provider  clindamycin (CLEOCIN) 300 MG capsule Take 1 capsule (300 mg total) by mouth 4 (four) times daily. X 7 days 08/07/13   Junious SilkHannah Merrell, PA-C  dicyclomine (BENTYL) 20 MG tablet Take 1 tablet (20 mg total) by mouth 2 (two) times daily. 04/10/13   Glynn OctaveStephen Rancour, MD  ibuprofen (ADVIL,MOTRIN) 200 MG tablet Take  200 mg by mouth every 6 (six) hours as needed for moderate pain.    Historical Provider, MD  ondansetron (ZOFRAN) 4 MG tablet Take 1 tablet (4 mg total) by mouth every 6 (six) hours. 04/10/13   Glynn Octave, MD  oxyCODONE-acetaminophen (PERCOCET/ROXICET) 5-325 MG per tablet Take 1-2 tablets by mouth every 6 (six) hours as needed for severe pain. 08/07/13   Junious Silk, PA-C  promethazine (PHENERGAN) 25 MG tablet Take 1 tablet (25 mg total) by mouth every 6 (six) hours as needed for nausea or vomiting. 08/07/13   Junious Silk, PA-C    Physical Exam: Vitals:   11/16/15 2342 11/16/15 2348 11/17/15 0042 11/17/15 0200    BP: 93/61 103/60  (!) (P) 79/45  Pulse: 111  (!) 112   Resp: (!) 28  (!) 27   Temp: 101.2 F (38.4 C)  (!) 100.4 F (38 C) 98.7 F (37.1 C)  TempSrc: Oral   Oral  SpO2: 96%  96%   Weight:   43 kg (94 lb 12.8 oz)   Height:   5\' 4"  (1.626 m)      General:  Appears older than stated age, disheveled, cachectic  But is NAD Eyes:  PERRL, EOMI, normal lids, iris ENT:  grossly normal hearing, lips & tongue, moderately dry mm, poor dentition for age Neck:  no LAD, masses or thyromegaly Cardiovascular: tachycardia, no m/r/g. No LE edema.  Respiratory:  CTA bilaterally anteriorly (reports she is unable to lean forward, sit up, etc), no w/r/r. Normal respiratory effort. Abdomen: soft, ntnd, NABS Skin: diffuse excoriations in various stages of healing, appears to have track marks diffusely on arms and legs Musculoskeletal:  grossly normal tone BUE/BLE, good ROM, no bony abnormality Psychiatric: depressed mood and blunted affect, speech fluent and appropriate, AOx3, appears uncomfortable and likely beginning to experience opiate withdrawal Neurologic: CN 2-12 grossly intact, moves all extremities in coordinated fashion, sensation intact  Labs on Admission: I have personally reviewed following labs and imaging studies  CBC:  Recent Labs Lab 11/16/15 2030  WBC 8.9  NEUTROABS 7.3  HGB 10.9*  HCT 30.9*  MCV 75.6*  PLT 30*   Basic Metabolic Panel:  Recent Labs Lab 11/16/15 2030  NA 117*  K 3.8  CL 82*  CO2 19*  GLUCOSE 107*  BUN 59*  CREATININE 4.43*  CALCIUM 6.4*   GFR: Estimated Creatinine Clearance: 12.8 mL/min (by C-G formula based on SCr of 4.43 mg/dL). Liver Function Tests:  Recent Labs Lab 11/16/15 2030  AST 33  ALT 12*  ALKPHOS 129*  BILITOT 0.7  PROT 6.5  ALBUMIN 2.1*   No results for input(s): LIPASE, AMYLASE in the last 168 hours. No results for input(s): AMMONIA in the last 168 hours. Coagulation Profile: No results for input(s): INR, PROTIME in the  last 168 hours. Cardiac Enzymes:  Recent Labs Lab 11/16/15 2030  CKTOTAL 17*  TROPONINI 0.04*   BNP (last 3 results) No results for input(s): PROBNP in the last 8760 hours. HbA1C: No results for input(s): HGBA1C in the last 72 hours. CBG: No results for input(s): GLUCAP in the last 168 hours. Lipid Profile: No results for input(s): CHOL, HDL, LDLCALC, TRIG, CHOLHDL, LDLDIRECT in the last 72 hours. Thyroid Function Tests: No results for input(s): TSH, T4TOTAL, FREET4, T3FREE, THYROIDAB in the last 72 hours. Anemia Panel: No results for input(s): VITAMINB12, FOLATE, FERRITIN, TIBC, IRON, RETICCTPCT in the last 72 hours. Urine analysis:    Component Value Date/Time   COLORURINE YELLOW 11/16/2015  2050   APPEARANCEUR TURBID (A) 11/16/2015 2050   LABSPEC 1.015 11/16/2015 2050   PHURINE 5.0 11/16/2015 2050   GLUCOSEU NEGATIVE 11/16/2015 2050   HGBUR SMALL (A) 11/16/2015 2050   BILIRUBINUR NEGATIVE 11/16/2015 2050   KETONESUR NEGATIVE 11/16/2015 2050   PROTEINUR 100 (A) 11/16/2015 2050   UROBILINOGEN 0.2 01/07/2012 1140   NITRITE NEGATIVE 11/16/2015 2050   LEUKOCYTESUR SMALL (A) 11/16/2015 2050    Creatinine Clearance: Estimated Creatinine Clearance: 12.8 mL/min (by C-G formula based on SCr of 4.43 mg/dL).  Sepsis Labs: @LABRCNTIP (procalcitonin:4,lacticidven:4) )No results found for this or any previous visit (from the past 240 hour(s)).   Radiological Exams on Admission: Dg Chest 2 View  Result Date: 11/16/2015 CLINICAL DATA:  Intermittent moderate cough beginning 2 days ago. Shortness breath. Moderate back and rib pain. Next healed EXAM: CHEST  2 VIEW COMPARISON:  Two-view chest x-ray 04/10/2013. FINDINGS: Multi focal right lower lobe airspace disease is present. Additional right upper lobe airspace disease present. There is prominence of the hila bilaterally. Linear densities are noted at the left base. The heart size is normal. The visualized soft tissues and bony thorax  are unremarkable. IMPRESSION: 1. Multi focal pneumonia involving the right lower lobe, right upper lobe, and possibly left lower lobe. 2. Moderate hilar prominence likely representing vascular congestion neck and possible reactive adenopathy. 3. Recommend follow-up two-view chest x-ray following appropriate treatment therapy. Electronically Signed   By: Marin Roberts M.D.   On: 11/16/2015 21:32    EKG: Independently reviewed.  Atrial flutter with 2:1 block, rate 123; repeating this EKG now  Assessment/Plan Principal Problem:   Sepsis (HCC) Active Problems:   CAP (community acquired pneumonia)   Polysubstance dependence including opioid type drug with complication, continuous use (HCC)   Opioid use with withdrawal (HCC)   Hyponatremia   Acute renal failure (HCC)   Hypocalcemia   Nonspecific abnormal electrocardiogram (ECG) (EKG)   Thrombocytopenia (HCC)   Sepsis -Source is multifocal pneumonia (CAP) -Her Pneumonia Severity Index score is 123, Class IV, with a mortality rate of 9.3% -Given Vanc/Zosyn for unknown source of sepsis initially, will continue based on severity of illness for now (can taper down once she is clinically improved). Discussed Vanc with pharmacist due to concern about renal toxicity in patient with acute renal failure, but she reassured me that they will carefully monitor levels with her renal dysfunction and that this is appropriate at this time. -LP was performed at [facility] and gram stain is negative and CSF is reassuring -Blood and urine cultures are pending -I do not currently appreciate a murmur but given her h/o IVDA, if her blood cultures are positive she will need an evaluation for endocarditis -Supplemental O2 as needed - her pO2 was low on ABG, but her O2 sats have all been at least 93%. -Albuterol nebs as needed for wheezing -Sepsis protocol was implemented at [facility] and patient received 30 cc/kg bolus there; will continue with sepsis order set as per  protocol and will continue to trend lactate for now -Mild hypotension although with her cachexia, it is possible that this is at/close to her baseline BP; will bolus for SBP <80  Hyponatremia, severe -Patient with presumed hypovolemic hyponatremia -This is complicated by her acute renal failure -She is currently alert, oriented, and conversant - indicating that it perhaps is subacute -Since the duration is unknown, I am reluctant to try to reverse it quickly due to the risk of central pontine myelinolysis -Goal in correction will be no  more than 8 mmol/L in a 24 hour period -She will receive 250 mL boluses as needed to maintain her BP; will be fluid restricted to 1200 mL daily with her diet; and will receive 50 cc/hr otherwise -Will check q4h BMP to monitor rise in sodium  Acute renal failure -This is complicated by her hyponatremia -This is very likely due to hypovolemia and so patient needs IVF to correct this issue -She will receive boluses as needed to maintain her BP and will otherwise receive 50 cc/hr for now while monitoring Na and Cr q4h -If not responding to IVF, may need nephrology consultation - but currently making urine and does not have an indication for dialysis  Polysubstance dependence -Patient voices interest in quitting both tobacco and drugs -Tobacco Dependence: encourage cessation.  This was discussed with the patient and should be reviewed on an ongoing basis.  Patch ordered at patient request. -Will request social work consult for assistance with substance abuse -Pain needs to be treated, but with caution due to patient's substance abuse and renal dysfunction.  For now, will use Tylenol and Tramadol for treatment of pain -Patient also appears to be experiencing withdrawal symptoms.  Will order symptomatic treatment for opiate withdrawal (Robaxin, Imodium, Clonidine if BP will tolerate) -Based on positive UDS for BZD (which patient denies), will order intermittent doses  of Ativan to prevent BZD withdrawal.   -She does not report h/o ETOH dependence and so CIWA protocol not ordered.  Hypocalcemia -This could actually explain some of the myalgias she is experiencing.   -Will add an ionized calcium and if low, will replete -She [facility]  Abnormal EKG -Appeared to show atrial flutter on initial EKG -Will repeat -Mildly increased troponin to 0.04 which is likely associated with kidney dysfunction but will trend -If present, will request cardiology consult  Thrombocytopenia -This is a new problem for her and amy be associated with her sepsis -Will hold Lovenox and trend  DVT prophylaxis: SCDs Code Status:  Full - confirmed with patient Family Communication: None present  Disposition Plan:  Home once clinically improved Consults called: SW  Admission status: Admit to SDU    Jonah BlueJennifer Kendrik Mcshan MD [facility]  If 7PM-7AM, please contact night-coverage [email or web address] Password [email or web address]  11/17/2015, 2:15 AM

## 2015-11-17 NOTE — Progress Notes (Signed)
BCID positive for enterococcus and S.aureus -continue vancomycin IV -D/C zosyn -surveillance blood cultures -order TTE -likely needs TEE given clinical hx--plan to call 8/17 -pulm infiltrates likely represents metastatic infection from suspected bacteremia  DTat

## 2015-11-17 NOTE — Progress Notes (Addendum)
PROGRESS NOTE  Catherine Hernandez ZOX:[id number] DOB: 1985/04/26 DOA: 11/16/2015 PCP: No PCP Per Patient  Brief History:  30 year old female with a history of polysubstance abuse (cocaine, heroin, tobacco, marijuana), asthma presented with increasing shortness of breath and coughing. Her symptoms were associated with diffuse myalgias and poor oral intake. Apparently, the patient's last heroin and cocaine use was approximately 2 weeks prior to admission. Upon presentation. The patient was noted to be febrile with a fever up to 101.62F, hypertension, and tachycardia. Code sepsis was activated, and the patient was started on intravenous fluids and intravenous antibiotics.  Assessment/Plan: Sepsis -Concern about underlying bacteremia with possible metastatic infection to the lung -patient has pulmonary infiltrates which certainly may represent pneumonia, but concerned about metastatic infection from bacteremia -Lumbar puncture on 11/16/2015 not consistent with infection -Continue IV fluids -Continue vancomycin and Zosyn pending culture data -lactic acid 2.16-->0.99 -procalcitonin 18.52  Pulmonary infiltrates -V/Q scan -CT chest without contrast -concerned about metastatic infection and PE  Acute kidney injury -04/09/2013 serum creatinine 1.01 -11/16/2015 serum creatinine 4.43 -Renal ultrasound -UA with pyuria and proteinuria -check urine protein creatinine ratio  Hyponatremia -likely volume depletion -contine IVF -monitor serial Na levels  Thrombocytopenia -due to sepsis -HIV antibody  Hypokalemia -replete -check mag  Hypocalcemia -corrected calcium 8.0 -check 25 vitamin D   Hemoptysis -INR 1.45 -PTT 40 -trend Hgb   Disposition Plan:   Remain in step down Family Communication:   No Family at bedside..Total time spent 35 minutes.  Greater than 50% spent face to face counseling and coordinating care.--[id number]   Consultants:  none  Code Status:   FULL  DVT Prophylaxis:  SCDs   Procedures: As Listed in Progress Note Above  Antibiotics: Vanco/zosyn 11/16/15>>>    Subjective: Patient denies fevers, chills, headache, chest pain, dyspnea, nausea, vomiting, diarrhea, abdominal pain, dysuria, hematuria, hematochezia, and melena.   Objective: Vitals:   11/17/15 0458 11/17/15 0500 11/17/15 0600 11/17/15 0755  BP:  (!) 89/58 (!) 84/52 (!) 80/48  Pulse:  87 97 88  Resp:  (!) 25 (!) 31 (!) 28  Temp:    97.9 F (36.6 C)  TempSrc:    Oral  SpO2:  100% 98% 98%  Weight: 43.3 kg (95 lb 7.4 oz)     Height:        Intake/Output Summary (Last 24 hours) at 11/17/15 0841 Last data filed at 11/17/15 0647  Gross per 24 hour  Intake           531.67 ml  Output                0 ml  Net           531.67 ml   Weight change:  Exam:   General:  Pt is alert, follows commands appropriately, not in acute distress  HEENT: No icterus, No thrush, No neck mass, /AT  Cardiovascular: RRR, S1/S2, no rubs, no gallops  Respiratory: Bibasilar crackles. No wheeze. Good air movement.  Abdomen: Soft/+BS, non tender, non distended, no guarding  Extremities: trace LE edema, No lymphangitis, No petechiae, No rashes, no synovitis   Data Reviewed: I have personally reviewed following labs and imaging studies Basic Metabolic Panel:  Recent Labs Lab 11/16/15 2030 11/17/15 0243  NA 117* 121*  K 3.8 2.7*  CL 82* 88*  CO2 19* 17*  GLUCOSE 107* 91  BUN 59* 55*  CREATININE 4.43* 4.43*  CALCIUM 6.4* 6.8*   Liver  Function Tests:  Recent Labs Lab 11/16/15 2030  AST 33  ALT 12*  ALKPHOS 129*  BILITOT 0.7  PROT 6.5  ALBUMIN 2.1*   No results for input(s): LIPASE, AMYLASE in the last 168 hours. No results for input(s): AMMONIA in the last 168 hours. Coagulation Profile:  Recent Labs Lab 11/17/15 0243  INR 1.45   CBC:  Recent Labs Lab 11/16/15 2030  WBC 8.9  NEUTROABS 7.3  HGB 10.9*  HCT 30.9*  MCV 75.6*  PLT 30*    Cardiac Enzymes:  Recent Labs Lab 11/16/15 2030 11/17/15 0243  CKTOTAL 17*  --   TROPONINI 0.04* 0.03*   BNP: Invalid input(s): POCBNP CBG: No results for input(s): GLUCAP in the last 168 hours. HbA1C: No results for input(s): HGBA1C in the last 72 hours. Urine analysis:    Component Value Date/Time   COLORURINE YELLOW 11/16/2015 2050   APPEARANCEUR TURBID (A) 11/16/2015 2050   LABSPEC 1.015 11/16/2015 2050   PHURINE 5.0 11/16/2015 2050   GLUCOSEU NEGATIVE 11/16/2015 2050   HGBUR SMALL (A) 11/16/2015 2050   BILIRUBINUR NEGATIVE 11/16/2015 2050   KETONESUR NEGATIVE 11/16/2015 2050   PROTEINUR 100 (A) 11/16/2015 2050   UROBILINOGEN 0.2 01/07/2012 1140   NITRITE NEGATIVE 11/16/2015 2050   LEUKOCYTESUR SMALL (A) 11/16/2015 2050   Sepsis Labs: @LABRCNTIP (procalcitonin:4,lacticidven:4) ) Recent Results (from the past 240 hour(s))  MRSA PCR Screening     Status: Abnormal   Collection Time: 11/17/15 12:45 AM  Result Value Ref Range Status   MRSA by PCR POSITIVE (A) NEGATIVE Final    Comment:        The GeneXpert MRSA Assay (FDA approved for NASAL specimens only), is one component of a comprehensive MRSA colonization surveillance program. It is not intended to diagnose MRSA infection nor to guide or monitor treatment for MRSA infections. RESULT CALLED TO, READ BACK BY AND VERIFIED WITH: K ABDUSSURAN,RN @0440  11/17/15 MKELLY      Scheduled Meds: . Chlorhexidine Gluconate Cloth  6 each Topical Q0600  . feeding supplement (NEPRO CARB STEADY)  237 mL Oral BID BM  . folic acid  1 mg Intravenous Daily  . multivitamin with minerals  1 tablet Oral Daily  . mupirocin ointment  1 application Nasal BID  . nicotine  14 mg Transdermal Daily  . piperacillin-tazobactam (ZOSYN)  IV  2.25 g Intravenous Q8H  . potassium chloride  30 mEq Oral Q4H  . sodium chloride flush  3 mL Intravenous Q12H  . thiamine  100 mg Intravenous Daily  . [START ON 11/19/2015] vancomycin  500 mg  Intravenous Q48H   Continuous Infusions: . sodium chloride 0.9 % 1,000 mL with potassium chloride 40 mEq infusion      Procedures/Studies: Dg Chest 2 View  Result Date: 11/16/2015 CLINICAL DATA:  Intermittent moderate cough beginning 2 days ago. Shortness breath. Moderate back and rib pain. Next healed EXAM: CHEST  2 VIEW COMPARISON:  Two-view chest x-ray 04/10/2013. FINDINGS: Multi focal right lower lobe airspace disease is present. Additional right upper lobe airspace disease present. There is prominence of the hila bilaterally. Linear densities are noted at the left base. The heart size is normal. The visualized soft tissues and bony thorax are unremarkable. IMPRESSION: 1. Multi focal pneumonia involving the right lower lobe, right upper lobe, and possibly left lower lobe. 2. Moderate hilar prominence likely representing vascular congestion neck and possible reactive adenopathy. 3. Recommend follow-up two-view chest x-ray following appropriate treatment therapy. Electronically Signed   By: Marin Robertshristopher  Mattern  M.D.   On: 11/16/2015 21:32    Ragena Fiola, DO  [facility] Pager [phone]  If 7PM-7AM, please contact night-coverage [email or web address] Password [email or web address] 11/17/2015, 8:41 AM   LOS: 1 day

## 2015-11-17 NOTE — Progress Notes (Signed)
Patient's ABG at 2145 11/16/2015  was done on room air

## 2015-11-17 NOTE — Progress Notes (Signed)
  PHARMACY - PHYSICIAN COMMUNICATION CRITICAL VALUE ALERT - BLOOD CULTURE IDENTIFICATION (BCID)  Results for orders placed or performed during the hospital encounter of 11/16/15  Blood Culture ID Panel (Reflexed) (Collected: 11/16/2015  8:30 PM)  Result Value Ref Range   Enterococcus species DETECTED (A) NOT DETECTED   Vancomycin resistance NOT DETECTED NOT DETECTED   Listeria monocytogenes NOT DETECTED NOT DETECTED   Staphylococcus species DETECTED (A) NOT DETECTED   Staphylococcus aureus DETECTED (A) NOT DETECTED   Methicillin resistance NOT DETECTED NOT DETECTED   Streptococcus species NOT DETECTED NOT DETECTED   Streptococcus agalactiae NOT DETECTED NOT DETECTED   Streptococcus pneumoniae NOT DETECTED NOT DETECTED   Streptococcus pyogenes NOT DETECTED NOT DETECTED   Acinetobacter baumannii NOT DETECTED NOT DETECTED   Enterobacteriaceae species NOT DETECTED NOT DETECTED   Enterobacter cloacae complex NOT DETECTED NOT DETECTED   Escherichia coli NOT DETECTED NOT DETECTED   Klebsiella oxytoca NOT DETECTED NOT DETECTED   Klebsiella pneumoniae NOT DETECTED NOT DETECTED   Proteus species NOT DETECTED NOT DETECTED   Serratia marcescens NOT DETECTED NOT DETECTED   Carbapenem resistance NOT DETECTED NOT DETECTED   Haemophilus influenzae NOT DETECTED NOT DETECTED   Neisseria meningitidis NOT DETECTED NOT DETECTED   Pseudomonas aeruginosa NOT DETECTED NOT DETECTED   Candida albicans NOT DETECTED NOT DETECTED   Candida glabrata NOT DETECTED NOT DETECTED   Candida krusei NOT DETECTED NOT DETECTED   Candida parapsilosis NOT DETECTED NOT DETECTED   Candida tropicalis NOT DETECTED NOT DETECTED    Name of physician (or Provider) Contacted: Dr. Arbutus Leasat via amion  Changes to prescribed antibiotics required: none needed; currently on vancomycin and zosyn; consider discontinue zosyn; ID will also receive consult  Ruben Imony Kiran Lapine, PharmD Clinical Pharmacist Pager: [phone] 11/17/2015 6:12 PM

## 2015-11-17 NOTE — Progress Notes (Signed)
CRITICAL VALUE ALERT  Critical value received:  Troponin 0.03  Date of notification:  11/17/2015  Time of notification:  0640  Critical value read back:Yes.    Nurse who received alert:  Julious OkaKarimah Abdussalaam RN  MD notified (1st page):  TRH  Time of first page:     MD notified (2nd page):  Time of second page:  Responding MD:    Time MD responded:

## 2015-11-17 NOTE — Progress Notes (Signed)
Pharmacy Antibiotic Note  Catherine Hernandez is a 30 y.o. female admitted on 11/16/2015 with pneumonia and sepsis.  Pharmacy has been consulted for Vancocin and Zosyn dosing.  Discussed w/ Dr Ophelia CharterYates who reports high risk, requires broad-spectrum abx 2/2 polysubstance abuse.  SCr 4.43, last recorded SCr was 1.01 in 2015.  Plan: Rec'd vanc 1g and Zosyn 3.375g IV in ED. Vancomycin 500mg  IV every 48 hours.  Goal trough 15-20 mcg/mL.  Zosyn 2.25g IV every 8 hours. Monitor SCr and levels closely.  Height: 5\' 4"  (162.6 cm) Weight: 94 lb 12.8 oz (43 kg) IBW/kg (Calculated) : 54.7  Temp (24hrs), Avg:100.2 F (37.9 C), Min:99.1 F (37.3 C), Max:101.2 F (38.4 C)   Recent Labs Lab 11/16/15 2030 11/16/15 2039 11/16/15 2342  WBC 8.9  --   --   CREATININE 4.43*  --   --   LATICACIDVEN  --  2.16* 0.99    Estimated Creatinine Clearance: 12.8 mL/min (by C-G formula based on SCr of 4.43 mg/dL).    No Known Allergies    Thank you for allowing pharmacy to be a part of this patient's care.  Catherine Hernandez, PharmD, BCPS  11/17/2015 1:54 AM

## 2015-11-17 NOTE — Progress Notes (Signed)
CRITICAL VALUE ALERT  Critical value received:  +MRSA  Date of notification:  11/17/2015  Time of notification:  0440  Critical value read back:Yes.    Nurse who received alert:  Julious OkaKarimah Abdussalaam RN  MD notified (1st page):  Craige CottaKirby NP  Time of first page:  [phone]  MD notified (2nd page):  Time of second page:  Responding MD:    Time MD responded:

## 2015-11-17 NOTE — Plan of Care (Signed)
Problem: Fluid Volume: Goal: Hemodynamic stability will improve Outcome: Not Progressing Variance: Clinical complication Comments: Patient is code sepsis, however, can not received bolus fluids because of the low  Sodium. MD is concerned for cerebral edema.   Problem: Physical Regulation: Goal: Signs and symptoms of infection will decrease Outcome: Not Progressing Patient has very complicated clinical presentation.  Variance: Clinical complication   Problem: Nutrition: Goal: Adequate nutrition will be maintained Outcome: Not Progressing Variance: Clinical complication

## 2015-11-17 NOTE — Progress Notes (Signed)
CRITICAL VALUE ALERT  Critical value received:  Potassium 2.7  Date of notification:  11/17/2015  Time of notification:  0325  Critical value read back:Yes.    Nurse who received alert:  Julious OkaKarimah Abdussalaam RN  MD notified (1st page):  Yes  Time of first page:    MD notified (2nd page):  Time of second page:  Responding MD:    Time MD responded:

## 2015-11-18 ENCOUNTER — Inpatient Hospital Stay (HOSPITAL_COMMUNITY): Payer: Self-pay

## 2015-11-18 DIAGNOSIS — N179 Acute kidney failure, unspecified: Secondary | ICD-10-CM

## 2015-11-18 DIAGNOSIS — E43 Unspecified severe protein-calorie malnutrition: Secondary | ICD-10-CM | POA: Insufficient documentation

## 2015-11-18 DIAGNOSIS — I330 Acute and subacute infective endocarditis: Secondary | ICD-10-CM

## 2015-11-18 DIAGNOSIS — Z681 Body mass index (BMI) 19 or less, adult: Secondary | ICD-10-CM

## 2015-11-18 DIAGNOSIS — F1990 Other psychoactive substance use, unspecified, uncomplicated: Secondary | ICD-10-CM

## 2015-11-18 DIAGNOSIS — D649 Anemia, unspecified: Secondary | ICD-10-CM | POA: Diagnosis present

## 2015-11-18 DIAGNOSIS — R7881 Bacteremia: Secondary | ICD-10-CM | POA: Diagnosis present

## 2015-11-18 DIAGNOSIS — B952 Enterococcus as the cause of diseases classified elsewhere: Secondary | ICD-10-CM | POA: Diagnosis present

## 2015-11-18 DIAGNOSIS — K089 Disorder of teeth and supporting structures, unspecified: Secondary | ICD-10-CM

## 2015-11-18 DIAGNOSIS — R64 Cachexia: Secondary | ICD-10-CM

## 2015-11-18 DIAGNOSIS — R918 Other nonspecific abnormal finding of lung field: Secondary | ICD-10-CM

## 2015-11-18 LAB — HIV ANTIBODY (ROUTINE TESTING W REFLEX)
HIV Screen 4th Generation wRfx: NONREACTIVE
HIV Screen 4th Generation wRfx: NONREACTIVE

## 2015-11-18 LAB — ECHOCARDIOGRAM COMPLETE
CHL CUP DOP CALC LVOT VTI: 18.1 cm
E/e' ratio: 5.71
EWDT: 180 ms
FS: 29 % (ref 28–44)
HEIGHTINCHES: 64 in
IVS/LV PW RATIO, ED: 0.94
LA diam index: 1.89 cm/m2
LA vol index: 24.3 mL/m2
LASIZE: 27 mm
LAVOL: 34.8 mL
LAVOLA4C: 32.2 mL
LEFT ATRIUM END SYS DIAM: 27 mm
LV E/e'average: 5.71
LV PW d: 10.3 mm — AB (ref 0.6–1.1)
LV TDI E'MEDIAL: 10.1
LV e' LATERAL: 18.4 cm/s
LVEEMED: 5.71
LVOTPV: 82 cm/s
Lateral S' vel: 13.6 cm/s
MV Dec: 180
MV Peak grad: 4 mmHg
MV pk E vel: 105 m/s
MVPKAVEL: 71.3 m/s
RV sys press: 35 mmHg
Reg peak vel: 261 cm/s
TAPSE: 24.7 mm
TDI e' lateral: 18.4
TR max vel: 261 cm/s
WEIGHTICAEL: 1527.35 [oz_av]

## 2015-11-18 LAB — CBC
HEMATOCRIT: 25.2 % — AB (ref 36.0–46.0)
HEMOGLOBIN: 8.5 g/dL — AB (ref 12.0–15.0)
MCH: 26.4 pg (ref 26.0–34.0)
MCHC: 33.7 g/dL (ref 30.0–36.0)
MCV: 78.3 fL (ref 78.0–100.0)
Platelets: 44 10*3/uL — ABNORMAL LOW (ref 150–400)
RBC: 3.22 MIL/uL — ABNORMAL LOW (ref 3.87–5.11)
RDW: 17.4 % — ABNORMAL HIGH (ref 11.5–15.5)
WBC: 8.9 10*3/uL (ref 4.0–10.5)

## 2015-11-18 LAB — COMPREHENSIVE METABOLIC PANEL
ALK PHOS: 50 U/L (ref 38–126)
ALT: 11 U/L — AB (ref 14–54)
AST: 23 U/L (ref 15–41)
Albumin: 1.4 g/dL — ABNORMAL LOW (ref 3.5–5.0)
Anion gap: 9 (ref 5–15)
BILIRUBIN TOTAL: 0.5 mg/dL (ref 0.3–1.2)
BUN: 68 mg/dL — AB (ref 6–20)
CALCIUM: 6.8 mg/dL — AB (ref 8.9–10.3)
CHLORIDE: 105 mmol/L (ref 101–111)
CO2: 14 mmol/L — ABNORMAL LOW (ref 22–32)
CREATININE: 4.72 mg/dL — AB (ref 0.44–1.00)
GFR calc Af Amer: 13 mL/min — ABNORMAL LOW (ref >60)
GFR, EST NON AFRICAN AMERICAN: 12 mL/min — AB (ref >60)
Glucose, Bld: 145 mg/dL — ABNORMAL HIGH (ref 65–99)
Potassium: 5.5 mmol/L — ABNORMAL HIGH (ref 3.5–5.1)
Sodium: 128 mmol/L — ABNORMAL LOW (ref 135–145)
TOTAL PROTEIN: 5.1 g/dL — AB (ref 6.5–8.1)

## 2015-11-18 LAB — HEPATITIS B SURFACE ANTIGEN: Hepatitis B Surface Ag: NEGATIVE

## 2015-11-18 LAB — RPR: RPR: NONREACTIVE

## 2015-11-18 LAB — URINE CULTURE

## 2015-11-18 LAB — CALCIUM, IONIZED: CALCIUM, IONIZED, SERUM: 4 mg/dL — AB (ref 4.5–5.6)

## 2015-11-18 LAB — VITAMIN D 25 HYDROXY (VIT D DEFICIENCY, FRACTURES): Vit D, 25-Hydroxy: 20.4 ng/mL — ABNORMAL LOW (ref 30.0–100.0)

## 2015-11-18 LAB — HEPATITIS C ANTIBODY: HCV Ab: 0.3 s/co ratio (ref 0.0–0.9)

## 2015-11-18 LAB — SODIUM, URINE, RANDOM: Sodium, Ur: <10 mmol/L

## 2015-11-18 MED ORDER — SODIUM CHLORIDE 0.9 % IV SOLN
INTRAVENOUS | Status: DC
  Administered 2015-11-18 – 2015-11-21 (×3): via INTRAVENOUS

## 2015-11-18 MED ORDER — AMPICILLIN-SULBACTAM SODIUM 3 (2-1) G IJ SOLR
3.0000 g | Freq: Two times a day (BID) | INTRAMUSCULAR | Status: DC
  Administered 2015-11-18 – 2015-11-21 (×6): 3 g via INTRAVENOUS
  Filled 2015-11-18 (×9): qty 3

## 2015-11-18 MED ORDER — POTASSIUM CHLORIDE IN NACL 40-0.9 MEQ/L-% IV SOLN
INTRAVENOUS | Status: DC
  Administered 2015-11-18: 75 mL/h via INTRAVENOUS
  Filled 2015-11-18: qty 1000

## 2015-11-18 NOTE — Progress Notes (Signed)
Initial Nutrition Assessment  DOCUMENTATION CODES:   Non-severe (moderate) malnutrition in context of social or environmental circumstances, Underweight  INTERVENTION:    Continue Nepro Shake po BID, each supplement provides 425 kcal and 19 grams protein  NUTRITION DIAGNOSIS:   Malnutrition related to chronic illness as evidenced by moderate depletions of muscle mass, moderate depletion of body fat  GOAL:   Patient will meet greater than or equal to 90% of their needs  MONITOR:   PO intake, Supplement acceptance, Labs, Weight trends, I & O's  REASON FOR ASSESSMENT:   Malnutrition Screening Tool  ASSESSMENT:   30 y.o. Female with medical history significant of polysubstance dependence and asthma presenting with cough/SOB.  Patient reports that she started coughing which cause her back to start hurting.  Happened about 3 days ago.  +productive of clear, frothy sputum.  No known fevers.  ?sick contacts.  +wheezing, better now.  Diffuse myalgias.  Minimal PO intake, not nauseated just hard to breathe, chew, and swallow simultaneously.  Patient sleepy upon RD visit. PO intake poor at 20% per flowsheet records. States her UBW is around 100 lbs. Has Nepro Shake supplement BID ordered. Pt underweight for height. Nutrition-Focused physical exam completed. Findings are moderate fat depletion, moderate muscle depletion, and no edema.  Diet Order:  Diet renal with fluid restriction Fluid restriction: 1200 mL Fluid; Room service appropriate? Yes; Fluid consistency: Thin  Skin:  Reviewed, no issues  Last BM:  8/14  Height:   Ht Readings from Last 1 Encounters:  11/17/15 5\' 4"  (1.626 m)    Weight:   Wt Readings from Last 1 Encounters:  11/17/15 95 lb 7.4 oz (43.3 kg)    Ideal Body Weight:  54.5 kg  BMI:  Body mass index is 16.39 kg/m.  Estimated Nutritional Needs:   Kcal:  1500-1700  Protein:  75-85 gm  Fluid:  1.5-1.7 L  EDUCATION NEEDS:   No education needs  identified at this time  Maureen ChattersKatie Gurdeep Keesey, RD, LDN Pager #: [phone] After-Hours Pager #: [phone]

## 2015-11-18 NOTE — Progress Notes (Signed)
PROGRESS NOTE  Catherine Hernandez VHQ:[id number] DOB: [birth year] DOA: 11/16/2015 PCP: No PCP Per Patient  Brief History:  30 year old female with a history of polysubstance abuse (cocaine, heroin, tobacco, marijuana), asthma presented with increasing shortness of breath and coughing. Her symptoms were associated with diffuse myalgias and poor oral intake. Apparently, the patient's last heroin and cocaine use was approximately 2 weeks prior to admission. Upon presentation. The patient was noted to be febrile with a fever up to 101.22F, hypertension, and tachycardia. Code sepsis was activated, and the patient was started on intravenous fluids and intravenous antibiotics.  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 -Lumbar puncture on 11/16/2015 not consistent with infection  -Continue IV fluids -Continue vancomycin and Zosyn pending culture data -lactic acid 2.16-->0.99 -procalcitonin 18.52  Polymicrobial Bacteremia -abx per ID -requested TEE--scheduled for 11/22/15 -blood culture prelim--MSSA and Enterococcus  Pulmonary infiltrates -V/Q scan--indeterminate -CT chest without contrast--multifocal nodular consolidations with cavitations likely septic emboli; RLL consolidation/ wedge-shaped infarct  Acute kidney injury -04/09/2013 serum creatinine 1.01 -11/16/2015 serum creatinine 4.43 -Renal ultrasound--bilateral echogenic kidneys; no hydronephrosis -UA with pyuria and proteinuria -check urine protein creatinine ratio--1.24  Hyponatremia -likely volume depletion -contine IVF -monitor serial Na levels  Thrombocytopenia -due to sepsis -HIV antibody--neg -hep B and C--negative  Hypokalemia-->mild hyperkalemia -repleted-->remove K from IVF -check mag--2.1  Hypocalcemia -corrected calcium 8.0 -check 25 vitamin  D  Hemoptysis -INR 1.45 -PTT 40 -trend Hgb  -improving    Disposition Plan:   Not stable for d/c Family Communication:   No Family at bedside--Total time spent 35 minutes.  Greater than 50% spent face to face counseling and coordinating care.   Consultants:  ID  Code Status:  FULL  DVT Prophylaxis:  SCDs   Procedures: As Listed in Progress Note Above  Antibiotics: Zosyn 8/15>>8/16 Vanco 8/15>>8/17 Unasyn 11/18/15>>>    Subjective: Patient denies fevers, chills, headache, chest pain, dyspnea, nausea, vomiting, diarrhea, abdominal pain, dysuria, hematuria, hematochezia, and melena.   Objective: Vitals:   11/17/15 1952 11/17/15 2328 11/18/15 0351 11/18/15 0800  BP: 101/60 92/61 93/65  98/78  Pulse: (!) 111 78 83 83  Resp: (!) 48 (!) 36 (!) 34 (!) 34  Temp: 98.8 F (37.1 C) 97 F (36.1 C) 97 F (36.1 C) 97.6 F (36.4 C)  TempSrc: Oral Axillary Oral Oral  SpO2: 98% 100% 98% 98%  Weight:      Height:        Intake/Output Summary (Last 24 hours) at 11/18/15 1041 Last data filed at 11/18/15 0650  Gross per 24 hour  Intake             1121 ml  Output             1075 ml  Net               46 ml   Weight change:  Exam:   General:  Pt is alert, follows commands appropriately, not in acute distress  HEENT: No icterus, No thrush, No neck mass, [place]/AT  Cardiovascular: RRR, S1/S2, no rubs, no gallops  Respiratory: Bilateral scattered crackles without any wheezing. Good air movement.  Abdomen: Soft/+BS, non tender, non distended, no guarding  Extremities: trace LE edema, No lymphangitis, No petechiae, No rashes, no synovitis   Data Reviewed: I have personally reviewed following labs and imaging studies Basic Metabolic Panel:  Recent  Labs Lab 11/17/15 0819 11/17/15 0932 11/17/15 1536 11/17/15 2045 11/18/15 0840  NA 122* 121* 123* 126* 128*  K 3.5 3.2* 4.0 5.5* 5.5*  CL 93* 92* 96* 102 105  CO2 15* 17* 16* 14* 14*  GLUCOSE 93 101* 126* 130* 145*   BUN 58* 59* 63* 68* 68*  CREATININE 4.46* 4.53* 4.44* 4.44* 4.72*  CALCIUM 6.6* 6.6* 6.7* 6.7* 6.8*  MG 2.1 2.1  --   --   --    Liver Function Tests:  Recent Labs Lab 11/16/15 2030 11/18/15 0840  AST 33 23  ALT 12* 11*  ALKPHOS 129* 50  BILITOT 0.7 0.5  PROT 6.5 5.1*  ALBUMIN 2.1* 1.4*   No results for input(s): LIPASE, AMYLASE in the last 168 hours. No results for input(s): AMMONIA in the last 168 hours. Coagulation Profile:  Recent Labs Lab 11/17/15 0243  INR 1.45   CBC:  Recent Labs Lab 11/16/15 2030 11/17/15 0932  WBC 8.9 8.3  NEUTROABS 7.3  --   HGB 10.9* 8.0*  HCT 30.9* 24.3*  MCV 75.6* 77.6*  PLT 30* 34*   Cardiac Enzymes:  Recent Labs Lab 11/16/15 2030 11/17/15 0243 11/17/15 0819 11/17/15 0932 11/17/15 1536  CKTOTAL 17*  --   --   --   --   TROPONINI 0.04* 0.03* 0.06* <0.03 <0.03   BNP: Invalid input(s): POCBNP CBG: No results for input(s): GLUCAP in the last 168 hours. HbA1C: No results for input(s): HGBA1C in the last 72 hours. Urine analysis:    Component Value Date/Time   COLORURINE YELLOW 11/16/2015 2050   APPEARANCEUR TURBID (A) 11/16/2015 2050   LABSPEC 1.015 11/16/2015 2050   PHURINE 5.0 11/16/2015 2050   GLUCOSEU NEGATIVE 11/16/2015 2050   HGBUR SMALL (A) 11/16/2015 2050   BILIRUBINUR NEGATIVE 11/16/2015 2050   KETONESUR NEGATIVE 11/16/2015 2050   PROTEINUR 100 (A) 11/16/2015 2050   UROBILINOGEN 0.2 01/07/2012 1140   NITRITE NEGATIVE 11/16/2015 2050   LEUKOCYTESUR SMALL (A) 11/16/2015 2050   Sepsis Labs: @LABRCNTIP (procalcitonin:4,lacticidven:4) ) Recent Results (from the past 240 hour(s))  Blood Culture (routine x 2)     Status: None (Preliminary result)   Collection Time: 11/16/15  8:30 PM  Result Value Ref Range Status   Specimen Description BLOOD RIGHT ARM  Final   Special Requests BOTTLES DRAWN AEROBIC AND ANAEROBIC 5CC  Final   Culture  Setup Time   Final    GRAM POSITIVE COCCI IN CLUSTERS IN BOTH AEROBIC  AND ANAEROBIC BOTTLES Organism ID to follow CRITICAL RESULT CALLED TO, READ BACK BY AND VERIFIED WITH: L POWELL PHARMD 1800 11/17/15 A BROWNING Performed at [facility]    Culture PENDING  Incomplete   Report Status PENDING  Incomplete  Blood Culture ID Panel (Reflexed)     Status: Abnormal   Collection Time: 11/16/15  8:30 PM  Result Value Ref Range Status   Enterococcus species DETECTED (A) NOT DETECTED Final    Comment: CRITICAL RESULT CALLED TO, READ BACK BY AND VERIFIED WITH: L POWELL PHARMD 1800 11/17/15 A BROWNING    Vancomycin resistance NOT DETECTED NOT DETECTED Final   Listeria monocytogenes NOT DETECTED NOT DETECTED Final   Staphylococcus species DETECTED (A) NOT DETECTED Final    Comment: CRITICAL RESULT CALLED TO, READ BACK BY AND VERIFIED WITH: L POWELL PHARMD 1800 11/17/15 A BROWNING    Staphylococcus aureus DETECTED (A) NOT DETECTED Final    Comment: CRITICAL RESULT CALLED TO, READ BACK BY AND VERIFIED WITH: L POWELL PHARMD  1800 11/17/15 A BROWNING    Methicillin resistance NOT DETECTED NOT DETECTED Final   Streptococcus species NOT DETECTED NOT DETECTED Final   Streptococcus agalactiae NOT DETECTED NOT DETECTED Final   Streptococcus pneumoniae NOT DETECTED NOT DETECTED Final   Streptococcus pyogenes NOT DETECTED NOT DETECTED Final   Acinetobacter baumannii NOT DETECTED NOT DETECTED Final   Enterobacteriaceae species NOT DETECTED NOT DETECTED Final   Enterobacter cloacae complex NOT DETECTED NOT DETECTED Final   Escherichia coli NOT DETECTED NOT DETECTED Final   Klebsiella oxytoca NOT DETECTED NOT DETECTED Final   Klebsiella pneumoniae NOT DETECTED NOT DETECTED Final   Proteus species NOT DETECTED NOT DETECTED Final   Serratia marcescens NOT DETECTED NOT DETECTED Final   Carbapenem resistance NOT DETECTED NOT DETECTED Final   Haemophilus influenzae NOT DETECTED NOT DETECTED Final   Neisseria meningitidis NOT DETECTED NOT DETECTED Final   Pseudomonas  aeruginosa NOT DETECTED NOT DETECTED Final   Candida albicans NOT DETECTED NOT DETECTED Final   Candida glabrata NOT DETECTED NOT DETECTED Final   Candida krusei NOT DETECTED NOT DETECTED Final   Candida parapsilosis NOT DETECTED NOT DETECTED Final   Candida tropicalis NOT DETECTED NOT DETECTED Final    Comment: Performed at [facility]  Urine culture     Status: Abnormal   Collection Time: 11/16/15  8:50 PM  Result Value Ref Range Status   Specimen Description URINE, RANDOM  Final   Special Requests NONE  Final   Culture MULTIPLE SPECIES PRESENT, SUGGEST RECOLLECTION (A)  Final   Report Status 11/18/2015 FINAL  Final  Blood Culture (routine x 2)     Status: None (Preliminary result)   Collection Time: 11/16/15  9:30 PM  Result Value Ref Range Status   Specimen Description BLOOD LEFT FOREARM  Final   Special Requests BOTTLES DRAWN AEROBIC AND ANAEROBIC 5CC  Final   Culture  Setup Time   Final    GRAM POSITIVE COCCI IN CLUSTERS IN BOTH AEROBIC AND ANAEROBIC BOTTLES CRITICAL RESULT CALLED TO, READ BACK BY AND VERIFIED WITH: L POWELL PHARMD 1800 11/17/15 A BROWNING Performed at [facility]    Culture PENDING  Incomplete   Report Status PENDING  Incomplete  MRSA PCR Screening     Status: Abnormal   Collection Time: 11/17/15 12:45 AM  Result Value Ref Range Status   MRSA by PCR POSITIVE (A) NEGATIVE Final    Comment:        The GeneXpert MRSA Assay (FDA approved for NASAL specimens only), is one component of a comprehensive MRSA colonization surveillance program. It is not intended to diagnose MRSA infection nor to guide or monitor treatment for MRSA infections. RESULT CALLED TO, READ BACK BY AND VERIFIED WITH: K ABDUSSURAN,RN @0440  11/17/15 MKELLY   Culture, blood (x 2)     Status: None (Preliminary result)   Collection Time: 11/17/15  2:35 AM  Result Value Ref Range Status   Specimen Description BLOOD BLOOD LEFT HAND  Final   Special Requests IN PEDIATRIC  BOTTLE 1CC  Final   Culture  Setup Time   Final    GRAM POSITIVE COCCI IN CLUSTERS AEROBIC BOTTLE ONLY CRITICAL VALUE NOTED.  VALUE IS CONSISTENT WITH PREVIOUSLY REPORTED AND CALLED VALUE.    Culture GRAM POSITIVE COCCI  Final   Report Status PENDING  Incomplete  Culture, blood (x 2)     Status: None (Preliminary result)   Collection Time: 11/17/15  2:45 AM  Result Value Ref Range Status  Specimen Description BLOOD BLOOD RIGHT HAND  Final   Special Requests IN PEDIATRIC BOTTLE 2CC  Final   Culture  Setup Time   Final    GRAM POSITIVE COCCI IN CLUSTERS AEROBIC BOTTLE ONLY CRITICAL RESULT CALLED TO, READ BACK BY AND VERIFIED WITH: L POWELL PHARMD 1800 11/17/15 A BROWNING    Culture PENDING  Incomplete   Report Status PENDING  Incomplete     Scheduled Meds: . Chlorhexidine Gluconate Cloth  6 each Topical Q0600  . feeding supplement (NEPRO CARB STEADY)  237 mL Oral BID BM  . folic acid  1 mg Intravenous Daily  . hydrocortisone sod succinate (SOLU-CORTEF) inj  100 mg Intravenous Q8H  . multivitamin with minerals  1 tablet Oral Daily  . mupirocin ointment  1 application Nasal BID  . nicotine  14 mg Transdermal Daily  . sodium chloride flush  3 mL Intravenous Q12H  . thiamine  100 mg Intravenous Daily   Continuous Infusions: . sodium chloride 75 mL/hr at 11/18/15 1040    Procedures/Studies: Dg Chest 2 View  Result Date: 11/16/2015 CLINICAL DATA:  Intermittent moderate cough beginning 2 days ago. Shortness breath. Moderate back and rib pain. Next healed EXAM: CHEST  2 VIEW COMPARISON:  Two-view chest x-ray 04/10/2013. FINDINGS: Multi focal right lower lobe airspace disease is present. Additional right upper lobe airspace disease present. There is prominence of the hila bilaterally. Linear densities are noted at the left base. The heart size is normal. The visualized soft tissues and bony thorax are unremarkable. IMPRESSION: 1. Multi focal pneumonia involving the right lower lobe,  right upper lobe, and possibly left lower lobe. 2. Moderate hilar prominence likely representing vascular congestion neck and possible reactive adenopathy. 3. Recommend follow-up two-view chest x-ray following appropriate treatment therapy. Electronically Signed   By: Marin Roberts M.D.   On: 11/16/2015 21:32   Ct Chest Wo Contrast  Result Date: 11/18/2015 CLINICAL DATA:  30 year old female with history of pulmonary infiltrates. Pneumonia. History of substance abuse. EXAM: CT CHEST WITHOUT CONTRAST TECHNIQUE: Multidetector CT imaging of the chest was performed following the standard protocol without IV contrast. COMPARISON:  No prior chest CT.  Chest x-ray 11/16/2015. FINDINGS: Cardiovascular: Heart size is normal. There is no significant pericardial fluid, thickening or pericardial calcification. Mediastinum/Nodes: Multiple prominent mediastinal lymph nodes measuring up to 1.5 cm in short axis in the subcarinal nodal station. Please note that accurate exclusion of hilar adenopathy is limited on noncontrast CT scans. Esophagus is unremarkable in appearance. No axillary lymphadenopathy. Lungs/Pleura: Multifocal airspace consolidation is noted throughout the lungs bilaterally, most confluent in the posterior aspect of the right lower lobe. Several of the areas of apparent airspace consolidation are somewhat nodular in appearance, and there is one cavitary area in the right upper lobe (image 80 of series 3) which measures up to 1.7 cm in diameter, in addition to a smaller cavitary area in the periphery of the left lower lobe (image 89 of series 3). The overall spectrum of findings is highly concerning for multifocal pneumonia, presumably from septic emboli. Given the somewhat wedge-shaped appearance of the largest area of consolidation in the right lower lobe, the possibility of significant ischemia/infarct in the right lower lobe related to a larger pulmonary embolus is considered likely (image 71 of series  3). Trace bilateral pleural effusions lie dependently. Upper Abdomen: Although incompletely visualized, the spleen appears enlarged measuring up to 13.8 x 5.1 cm on axial images. Musculoskeletal: There are no aggressive appearing lytic or blastic  lesions noted in the visualized portions of the skeleton. IMPRESSION: 1. The overall spectrum of findings in the lungs is compatible with multilobar pneumonia, likely from septic embolization. 2. The largest area of airspace consolidation in the right lower lobe has a very wedge-shaped in appearanceand is concerning for potential pulmonary infarct from larger embolus. This could be further evaluated with PE protocol CT scan if clinically appropriate. 3. Trace bilateral pleural effusions lying dependently. 4. Splenomegaly. These results will be called to the ordering clinician or representative by the Radiologist Assistant, and communication documented in the PACS or zVision Dashboard. Electronically Signed   By: Trudie Reedaniel  Entrikin M.D.   On: 11/18/2015 08:28   [place] Renal  Result Date: 11/17/2015 CLINICAL DATA:  Acute kidney injury.  Sepsis. EXAM: RENAL / URINARY TRACT ULTRASOUND COMPLETE COMPARISON:  None. FINDINGS: Right Kidney: Length: 13.8 cm, within normal limits. The kidney is somewhat echogenic. It is isoechoic to the index organ, the liver. No discrete mass lesion or stone is present. There is no hydronephrosis. Left Kidney: Length: 13.9 cm, within normal limits. The kidney is somewhat echogenic. It is isoechoic to the index organ, the spleen. No discrete mass lesion or stone is present. There is no hydronephrosis. Bladder: Appears normal for degree of bladder distention. Incidental note is made of multiple shadowing stones within the gallbladder. There is no significant wall thickening to suggest cholecystitis. IMPRESSION: 1. The kidneys are hyperechoic bilaterally. This is nonspecific, but can be seen in the setting of acute medical renal disease. 2. No renal  calculus or mass lesion.  No hydronephrosis. 3. Cholelithiasis without evidence for cholecystitis. Electronically Signed   By: Marin Robertshristopher  Mattern M.D.   On: 11/17/2015 22:27   Nm Pulmonary Perf And Vent  Result Date: 11/17/2015 CLINICAL DATA:  Shortness of breath, hemoptysis, elevated D-dimer, history asthma EXAM: NUCLEAR MEDICINE BOWEL (MECKEL'S) SCAN TECHNIQUE: Sequential abdominal images were obtained following intravenous injection of radiopharmaceutical. RADIOPHARMACEUTICALS:  mCi Tc-1167m pertechnetate IV 30.3 mCi Technetium-5867m DTPA aerosol inhalation and 4.0 mCi Technetium-3867m MAA IV COMPARISON:  None. None Correlation:  Chest radiograph 11/16/2015 FINDINGS: Ventilation: Diminished ventilation at apices. Focal ventilation defect at medial LEFT lower lobe. Additional diminished ventilation in both lower lobes. Perfusion: Small subsegmental perfusion defect lateral RIGHT upper lobe corresponding to focal opacity on chest radiograph. Additional large segmental defect medial RIGHT middle lobe corresponding to infiltrate on the chest radiograph. Additional perfusion defects in BILATERAL lower lobes, though stripe signs are present at both. Chest radiograph demonstrates multifocal multifocal infiltrates bilaterally. Presence of matching ventilation, perfusion, and radiographic abnormalities represents an intermediate probability for pulmonary embolism. IMPRESSION: Matching ventilatory, perfusion and radiographic abnormalities compatible with an intermediate probability for pulmonary embolism. Electronically Signed   By: Ulyses SouthwardMark  Boles M.D.   On: 11/17/2015 12:03    Eriberto Felch, DO  [facility] Pager [phone]  If 7PM-7AM, please contact night-coverage [email or web address] Password [email or web address] 11/18/2015, 10:41 AM   LOS: 2 days

## 2015-11-18 NOTE — Progress Notes (Signed)
  Echocardiogram 2D Echocardiogram has been performed.  Janalyn HarderWest, Kainen Struckman R 11/18/2015, 4:55 PM

## 2015-11-18 NOTE — Consult Note (Signed)
[facility]    Date of Admission:  11/16/2015           Day 2 vancomycin       Reason for Consult: Automatic consultation for MSSA and enterococcal bacteremia     Principal Problem:   Sepsis (HCC) Active Problems:   CAP (community acquired pneumonia)   Enterococcal bacteremia   Staphylococcus aureus bacteremia   Polysubstance dependence including opioid type drug with complication, continuous use (HCC)   Opioid use with withdrawal (HCC)   Hyponatremia   Acute renal failure (HCC)   Hypocalcemia   Nonspecific abnormal electrocardiogram (ECG) (EKG)   Thrombocytopenia (HCC)   Normocytic anemia   Protein-calorie malnutrition (HCC)   Poor dentition   . Chlorhexidine Gluconate Cloth  6 each Topical Q0600  . feeding supplement (NEPRO CARB STEADY)  237 mL Oral BID BM  . folic acid  1 mg Intravenous Daily  . hydrocortisone sod succinate (SOLU-CORTEF) inj  100 mg Intravenous Q8H  . multivitamin with minerals  1 tablet Oral Daily  . mupirocin ointment  1 application Nasal BID  . nicotine  14 mg Transdermal Daily  . sodium chloride flush  3 mL Intravenous Q12H  . thiamine  100 mg Intravenous Daily  . [START ON 11/19/2015] vancomycin  500 mg Intravenous Q48H    Recommendations: 1. Change vancomycin to ampicillin sulbactam 2. Await results of repeat blood cultures 3. Await results of transthoracic echocardiogram 4. Hold off on PICC placement  5. Check hepatitis B surface antibody 6. 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 I will change her to renally adjusted ampicillin sulbactam to improve coverage for her MSSA.    HPI: Catherine Hernandez is a 30 y.o. female with history of polysubstance abuse who was admitted 2 days ago with acute onset of cough, shortness of breath and fever. She had a temperature of 101.2  on admission and her chest x-ray revealed patchy bilateral infiltrates. Admission blood cultures have grown MSSA and enterococcus.   Review of Systems: Review of Systems  Unable to perform ROS: Mental acuity    Past Medical History:  Diagnosis Date  . CAP (community acquired pneumonia) 11/16/2015  . Childhood asthma   . Heroin abuse   . Pneumonia 2007  . Polysubstance abuse     Social History  Substance Use Topics  . Smoking status: Current Every Day Smoker    Packs/day: 0.50    Years: 10.00    Types: Cigarettes  . Smokeless tobacco: Never Used  . Alcohol use No    Family History  Problem Relation Age of Onset  . Anesthesia problems Neg Hx   . Hypotension Neg Hx   . Malignant hyperthermia Neg Hx   . Pseudochol deficiency Neg Hx    No Known Allergies  OBJECTIVE: Blood pressure 98/78, pulse 83, temperature 97.6 F (36.4 C), temperature source Oral, resp. rate (!) 34, height 5\' 4"  (1.626 m), weight 95 lb 7.4 oz (43.3 kg), SpO2 98 %.  Physical Exam  Constitutional:  She is cachectic with a BMI of 16. She is very groggy and unable to answer questions. She become somewhat agitated when I try to examine her.  HENT:  Poor dentition.  Eyes: Conjunctivae are normal.  Neck: Neck supple.  Cardiovascular: Normal rate and regular rhythm.   No murmur  heard. Pulmonary/Chest: Effort normal and breath sounds normal. She has no wheezes. She has no rales.  Abdominal: Soft. There is no tenderness.  Musculoskeletal: Normal range of motion. She exhibits no edema or tenderness.  Skin:  Several tattoos and track marks.    Lab Results Lab Results  Component Value Date   WBC 8.3 11/17/2015   HGB 8.0 (L) 11/17/2015   HCT 24.3 (L) 11/17/2015   MCV 77.6 (L) 11/17/2015   PLT 34 (L) 11/17/2015    Lab Results  Component Value Date   CREATININE 4.72 (H) 11/18/2015   BUN 68 (H) 11/18/2015   NA 128 (L) 11/18/2015   K 5.5 (H) 11/18/2015   CL 105 11/18/2015   CO2 14 (L) 11/18/2015      Lab Results  Component Value Date   ALT 11 (L) 11/18/2015   AST 23 11/18/2015   ALKPHOS 50 11/18/2015   BILITOT 0.5 11/18/2015     Microbiology: Recent Results (from the past 240 hour(s))  Blood Culture (routine x 2)     Status: None (Preliminary result)   Collection Time: 11/16/15  8:30 PM  Result Value Ref Range Status   Specimen Description BLOOD RIGHT ARM  Final   Special Requests BOTTLES DRAWN AEROBIC AND ANAEROBIC 5CC  Final   Culture  Setup Time   Final    GRAM POSITIVE COCCI IN CLUSTERS IN BOTH AEROBIC AND ANAEROBIC BOTTLES Organism ID to follow CRITICAL RESULT CALLED TO, READ BACK BY AND VERIFIED WITH: L POWELL PHARMD 1800 11/17/15 A BROWNING [facility]    Culture PENDING  Incomplete   Report Status PENDING  Incomplete  Blood Culture ID Panel (Reflexed)     Status: Abnormal   Collection Time: 11/16/15  8:30 PM  Result Value Ref Range Status   Enterococcus species DETECTED (A) NOT DETECTED Final    Comment: CRITICAL RESULT CALLED TO, READ BACK BY AND VERIFIED WITH: L POWELL PHARMD 1800 11/17/15 A BROWNING    Vancomycin resistance NOT DETECTED NOT DETECTED Final   Listeria monocytogenes NOT DETECTED NOT DETECTED Final   Staphylococcus species DETECTED (A) NOT DETECTED Final    Comment: CRITICAL RESULT CALLED TO, READ BACK BY AND VERIFIED WITH: L POWELL PHARMD 1800 11/17/15 A BROWNING    Staphylococcus aureus DETECTED (A) NOT DETECTED Final    Comment: CRITICAL RESULT CALLED TO, READ BACK BY AND VERIFIED WITH: L POWELL PHARMD 1800 11/17/15 A BROWNING    Methicillin resistance NOT DETECTED NOT DETECTED Final   Streptococcus species NOT DETECTED NOT DETECTED Final   Streptococcus agalactiae NOT DETECTED NOT DETECTED Final   Streptococcus pneumoniae NOT DETECTED NOT DETECTED Final   Streptococcus pyogenes NOT DETECTED NOT DETECTED Final   Acinetobacter baumannii NOT DETECTED NOT DETECTED Final   Enterobacteriaceae species NOT DETECTED NOT  DETECTED Final   Enterobacter cloacae complex NOT DETECTED NOT DETECTED Final   Escherichia coli NOT DETECTED NOT DETECTED Final   Klebsiella oxytoca NOT DETECTED NOT DETECTED Final   Klebsiella pneumoniae NOT DETECTED NOT DETECTED Final   Proteus species NOT DETECTED NOT DETECTED Final   Serratia marcescens NOT DETECTED NOT DETECTED Final   Carbapenem resistance NOT DETECTED NOT DETECTED Final   Haemophilus influenzae NOT DETECTED NOT DETECTED Final   Neisseria meningitidis NOT DETECTED NOT DETECTED Final   Pseudomonas aeruginosa NOT DETECTED NOT DETECTED Final   Candida albicans NOT DETECTED NOT DETECTED Final   Candida glabrata NOT DETECTED NOT DETECTED Final   Candida krusei NOT DETECTED NOT  DETECTED Final   Candida parapsilosis NOT DETECTED NOT DETECTED Final   Candida tropicalis NOT DETECTED NOT DETECTED Final    Comment: Performed at [facility]  Urine culture     Status: Abnormal   Collection Time: 11/16/15  8:50 PM  Result Value Ref Range Status   Specimen Description URINE, RANDOM  Final   Special Requests NONE  Final   Culture MULTIPLE SPECIES PRESENT, SUGGEST RECOLLECTION (A)  Final   Report Status 11/18/2015 FINAL  Final  Blood Culture (routine x 2)     Status: None (Preliminary result)   Collection Time: 11/16/15  9:30 PM  Result Value Ref Range Status   Specimen Description BLOOD LEFT FOREARM  Final   Special Requests BOTTLES DRAWN AEROBIC AND ANAEROBIC 5CC  Final   Culture  Setup Time   Final    GRAM POSITIVE COCCI IN CLUSTERS IN BOTH AEROBIC AND ANAEROBIC BOTTLES CRITICAL RESULT CALLED TO, READ BACK BY AND VERIFIED WITH: L POWELL PHARMD 1800 11/17/15 A BROWNING Performed [facility]    Culture PENDING  Incomplete   Report Status PENDING  Incomplete  MRSA PCR Screening     Status: Abnormal   Collection Time: 11/17/15 12:45 AM  Result Value Ref Range Status   MRSA by PCR POSITIVE (A) NEGATIVE Final    Comment:        The GeneXpert MRSA Assay  (FDA approved for NASAL specimens only), is one component of a comprehensive MRSA colonization surveillance program. It is not intended to diagnose MRSA infection nor to guide or monitor treatment for MRSA infections. RESULT CALLED TO, READ BACK BY AND VERIFIED WITH: K ABDUSSURAN,RN @0440  11/17/15 MKELLY   Culture, blood (x 2)     Status: None (Preliminary result)   Collection Time: 11/17/15  2:35 AM  Result Value Ref Range Status   Specimen Description BLOOD BLOOD LEFT HAND  Final   Special Requests IN PEDIATRIC BOTTLE 1CC  Final   Culture  Setup Time   Final    GRAM POSITIVE COCCI IN CLUSTERS AEROBIC BOTTLE ONLY CRITICAL VALUE NOTED.  VALUE IS CONSISTENT WITH PREVIOUSLY REPORTED AND CALLED VALUE.    Culture GRAM POSITIVE COCCI  Final   Report Status PENDING  Incomplete  Culture, blood (x 2)     Status: None (Preliminary result)   Collection Time: 11/17/15  2:45 AM  Result Value Ref Range Status   Specimen Description BLOOD BLOOD RIGHT HAND  Final   Special Requests IN PEDIATRIC BOTTLE 2CC  Final   Culture  Setup Time   Final    GRAM POSITIVE COCCI IN CLUSTERS AEROBIC BOTTLE ONLY CRITICAL RESULT CALLED TO, READ BACK BY AND VERIFIED WITH: L POWELL PHARMD 1800 11/17/15 A BROWNING    Culture PENDING  Incomplete   Report Status PENDING  Incomplete    Cliffton AstersJohn Zamoria Boss, MD [facility] [phone] pager   [phone] cell 11/18/2015, 9:52 AM

## 2015-11-18 NOTE — Progress Notes (Signed)
Pharmacy Antibiotic Note  Catherine Hernandez is a 30 y.o. female admitted on 11/16/2015 with bacteremia.  Pharmacy has been consulted for Unasyn dosing.  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  Cr has not corrected.    Plan: Unasyn 3g IV q12, adjusted for renal fxn & going with higher dose given endocarditis F/U cx and trend clinical status  Height: 5\' 4"  (162.6 cm) Weight: 95 lb 7.4 oz (43.3 kg) IBW/kg (Calculated) : 54.7  Temp (24hrs), Avg:97.6 F (36.4 C), Min:97 F (36.1 C), Max:98.8 F (37.1 C)   Recent Labs Lab 11/16/15 2030 11/16/15 2039 11/16/15 2342 11/17/15 0242  11/17/15 0819 11/17/15 0932 11/17/15 1536 11/17/15 1632 11/17/15 2045 11/18/15 0840  WBC 8.9  --   --   --   --   --  8.3  --   --   --   --   CREATININE 4.43*  --   --   --   < > 4.46* 4.53* 4.44*  --  4.44* 4.72*  LATICACIDVEN  --  2.16* 0.99 1.0  --  0.8  --   --  1.6  --   --   < > = values in this interval not displayed.  Estimated Creatinine Clearance: 12.1 mL/min (by C-G formula based on SCr of 4.72 mg/dL).    No Known Allergies  Antimicrobials this admission: Vanc 8/15 >> 8/17 Zosyn 8/15 >> 8/17 Unasyn 8/17  Microbiology results: 8/15  urine 8/15  blood x 2:  (+)GPC-clusters 8/15  [employer] (+)enterococcus sp, Staph sp, Staph aureus/(-)methicillin resistance 8/16  MRSA PCR (+) 8/16  blood x 2  Thank you for allowing pharmacy to be a part of this patient's care.  Marisue HumbleKendra Tedd Cottrill, PharmD Clinical Pharmacist [facility]

## 2015-11-19 ENCOUNTER — Inpatient Hospital Stay (HOSPITAL_COMMUNITY): Payer: Self-pay

## 2015-11-19 DIAGNOSIS — I368 Other nonrheumatic tricuspid valve disorders: Secondary | ICD-10-CM

## 2015-11-19 DIAGNOSIS — I079 Rheumatic tricuspid valve disease, unspecified: Secondary | ICD-10-CM | POA: Diagnosis present

## 2015-11-19 DIAGNOSIS — F1910 Other psychoactive substance abuse, uncomplicated: Secondary | ICD-10-CM

## 2015-11-19 LAB — CBC
HCT: 26.4 % — ABNORMAL LOW (ref 36.0–46.0)
HEMOGLOBIN: 8.4 g/dL — AB (ref 12.0–15.0)
MCH: 25.7 pg — ABNORMAL LOW (ref 26.0–34.0)
MCHC: 31.8 g/dL (ref 30.0–36.0)
MCV: 80.7 fL (ref 78.0–100.0)
PLATELETS: 74 10*3/uL — AB (ref 150–400)
RBC: 3.27 MIL/uL — AB (ref 3.87–5.11)
RDW: 17.7 % — ABNORMAL HIGH (ref 11.5–15.5)
WBC: 9 10*3/uL (ref 4.0–10.5)

## 2015-11-19 LAB — BASIC METABOLIC PANEL
ANION GAP: 9 (ref 5–15)
BUN: 71 mg/dL — ABNORMAL HIGH (ref 6–20)
CALCIUM: 7.5 mg/dL — AB (ref 8.9–10.3)
CHLORIDE: 111 mmol/L (ref 101–111)
CO2: 14 mmol/L — ABNORMAL LOW (ref 22–32)
CREATININE: 3.98 mg/dL — AB (ref 0.44–1.00)
GFR calc non Af Amer: 14 mL/min — ABNORMAL LOW (ref >60)
GFR, EST AFRICAN AMERICAN: 17 mL/min — AB (ref >60)
Glucose, Bld: 111 mg/dL — ABNORMAL HIGH (ref 65–99)
Potassium: 5 mmol/L (ref 3.5–5.1)
SODIUM: 134 mmol/L — AB (ref 135–145)

## 2015-11-19 MED ORDER — VITAMIN B-1 100 MG PO TABS
100.0000 mg | ORAL_TABLET | Freq: Every day | ORAL | Status: DC
  Administered 2015-11-20 – 2015-12-02 (×13): 100 mg via ORAL
  Filled 2015-11-19 (×13): qty 1

## 2015-11-19 MED ORDER — FOLIC ACID 1 MG PO TABS
1.0000 mg | ORAL_TABLET | Freq: Every day | ORAL | Status: DC
  Administered 2015-11-20 – 2015-12-02 (×13): 1 mg via ORAL
  Filled 2015-11-19 (×13): qty 1

## 2015-11-19 NOTE — Progress Notes (Signed)
Catherine Hernandez ZOX:[id number] DOB: 20-Nov-1985 DOA: 11/16/2015 PCP: No PCP Per Patient  Brief History:  30 year old female with a history of polysubstance abuse (cocaine, heroin, tobacco, marijuana), asthma presented with increasing shortness of breath and coughing. Her symptoms were associated with diffuse myalgias and poor oral intake. Apparently, the patient's last heroin and cocaine use was approximately 1 weeks prior to admission. Upon presentation. The patient was noted to be febrile with a fever up to 101.73F, hypertension, and tachycardia. Code sepsis was activated, and the patient was started on intravenous fluids and intravenous antibiotics.  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%, tricuspid valve vegetations -TEE scheduled for 11/22/2015  Pulmonary infiltrates/Septic emboli -V/Q scan--indeterminate -CT chest without contrast--multifocal nodular consolidations with cavitations likely septic emboli; RLL consolidation/ wedge-shaped infarct -hemoptysis improved  Acute kidney injury -secondary to sepsis and hemodynamic changes -04/09/2013 serum creatinine 1.01 -11/16/2015 serum creatinine 4.43 -Renal ultrasound--bilateral echogenic kidneys; no hydronephrosis -UA with pyuria and proteinuria -check urine protein creatinine ratio--1.24  Hyponatremia -likely volume depletion -contine IVF -slowly improving -monitor serial Na levels  Thrombocytopenia -due to sepsis -HIV antibody--neg -hep B and  C--negative  Hypokalemia-->mild hyperkalemia -repleted-->remove K from IVF -check mag--2.1  Hypocalcemia -corrected calcium 8.0 -check 25 vitamin D--20.4  Hemoptysis -INR 1.45 -PTT 40 -trend Hgb  -improved    Disposition Plan:   Not stable for d/c Family Communication:  Fiance updated at bedside--Total time spent 35 minutes.  Greater than 50% spent face to face counseling and coordinating care.   Consultants:  ID  Code Status:  FULL  DVT Prophylaxis:  SCDs   Procedures: As Listed in Catherine Note Above  Antibiotics: Zosyn 8/15>>8/16 Vanco 8/15>>8/17 Unasyn 11/18/15>>>    Subjective: Patient denies fevers, chills, headache, chest pain, dyspnea, nausea, vomiting, diarrhea, abdominal pain, dysuria, hematuria, hematochezia, and melena.   Objective: Vitals:   11/18/15 2345 11/19/15 0400 11/19/15 0550 11/19/15 0843  BP: 107/74  113/79 102/65  Pulse:   78   Resp: (!) 26   (!) 30  Temp: 97.9 F (36.6 C) 97.7 F (36.5 C)  97.8 F (36.6 C)  TempSrc: Oral Oral  Oral  SpO2: 100%  100%   Weight:   44.6 kg (98 lb 5.2 oz)   Height:        Intake/Output Summary (Last 24 hours) at 11/19/15 1001 Last data filed at 11/19/15 0600  Gross per 24 hour  Intake          1478.75 ml  Output              925 ml  Net           553.75 ml   Weight change:  Exam:   General:  Pt is alert, follows commands appropriately, not in acute distress  HEENT: No icterus, No thrush, No neck mass, [place]/AT  Cardiovascular: RRR, S1/S2, no rubs, no gallops  Respiratory: Scattered bilateral rales. No wheezing. Good air movement.  Abdomen: Soft/+BS, non tender, non distended, no guarding  Extremities: trace LE edema, No lymphangitis, No petechiae, No rashes, no synovitis   Data Reviewed: I have  personally reviewed following labs and imaging studies Basic Metabolic Panel:  Recent Labs Lab 11/17/15 0819 11/17/15 0932 11/17/15 1536 11/17/15 2045 11/18/15 0840  NA  122* 121* 123* 126* 128*  K 3.5 3.2* 4.0 5.5* 5.5*  CL 93* 92* 96* 102 105  CO2 15* 17* 16* 14* 14*  GLUCOSE 93 101* 126* 130* 145*  BUN 58* 59* 63* 68* 68*  CREATININE 4.46* 4.53* 4.44* 4.44* 4.72*  CALCIUM 6.6* 6.6* 6.7* 6.7* 6.8*  MG 2.1 2.1  --   --   --    Liver Function Tests:  Recent Labs Lab 11/16/15 2030 11/18/15 0840  AST 33 23  ALT 12* 11*  ALKPHOS 129* 50  BILITOT 0.7 0.5  PROT 6.5 5.1*  ALBUMIN 2.1* 1.4*   No results for input(s): LIPASE, AMYLASE in the last 168 hours. No results for input(s): AMMONIA in the last 168 hours. Coagulation Profile:  Recent Labs Lab 11/17/15 0243  INR 1.45   CBC:  Recent Labs Lab 11/16/15 2030 11/17/15 0932 11/18/15 1055  WBC 8.9 8.3 8.9  NEUTROABS 7.3  --   --   HGB 10.9* 8.0* 8.5*  HCT 30.9* 24.3* 25.2*  MCV 75.6* 77.6* 78.3  PLT 30* 34* 44*   Cardiac Enzymes:  Recent Labs Lab 11/16/15 2030 11/17/15 0243 11/17/15 0819 11/17/15 0932 11/17/15 1536  CKTOTAL 17*  --   --   --   --   TROPONINI 0.04* 0.03* 0.06* <0.03 <0.03   BNP: Invalid input(s): POCBNP CBG: No results for input(s): GLUCAP in the last 168 hours. HbA1C: No results for input(s): HGBA1C in the last 72 hours. Urine analysis:    Component Value Date/Time   COLORURINE YELLOW 11/16/2015 2050   APPEARANCEUR TURBID (A) 11/16/2015 2050   LABSPEC 1.015 11/16/2015 2050   PHURINE 5.0 11/16/2015 2050   GLUCOSEU NEGATIVE 11/16/2015 2050   HGBUR SMALL (A) 11/16/2015 2050   BILIRUBINUR NEGATIVE 11/16/2015 2050   KETONESUR NEGATIVE 11/16/2015 2050   PROTEINUR 100 (A) 11/16/2015 2050   UROBILINOGEN 0.2 01/07/2012 1140   NITRITE NEGATIVE 11/16/2015 2050   LEUKOCYTESUR SMALL (A) 11/16/2015 2050   Sepsis Labs: @LABRCNTIP (procalcitonin:4,lacticidven:4) ) Recent Results (from the past 240 hour(s))  Blood Culture (routine x 2)     Status: Abnormal (Preliminary result)   Collection Time: 11/16/15  8:30 PM  Result Value Ref Range Status   Specimen  Description BLOOD RIGHT ARM  Final   Special Requests BOTTLES DRAWN AEROBIC AND ANAEROBIC 5CC  Final   Culture  Setup Time   Final    GRAM POSITIVE COCCI IN CLUSTERS IN BOTH AEROBIC AND ANAEROBIC BOTTLES Organism ID to follow CRITICAL RESULT CALLED TO, READ BACK BY AND VERIFIED WITH: L POWELL PHARMD 1800 11/17/15 A BROWNING    Culture (A)  Final    STAPHYLOCOCCUS AUREUS CULTURE REINCUBATED FOR BETTER GROWTH Performed at [facility]    Report Status PENDING  Incomplete   Organism ID, Bacteria STAPHYLOCOCCUS AUREUS  Final      Susceptibility   Staphylococcus aureus - MIC*    CIPROFLOXACIN <=0.5 SENSITIVE Sensitive     ERYTHROMYCIN <=0.25 SENSITIVE Sensitive     GENTAMICIN <=0.5 SENSITIVE Sensitive     OXACILLIN 0.5 SENSITIVE Sensitive     TETRACYCLINE <=1 SENSITIVE Sensitive     VANCOMYCIN <=0.5 SENSITIVE Sensitive     TRIMETH/SULFA <=10 SENSITIVE Sensitive     CLINDAMYCIN <=0.25 SENSITIVE Sensitive     RIFAMPIN <=0.5 SENSITIVE Sensitive     Inducible Clindamycin NEGATIVE  Sensitive     * STAPHYLOCOCCUS AUREUS  Blood Culture ID Panel (Reflexed)     Status: Abnormal   Collection Time: 11/16/15  8:30 PM  Result Value Ref Range Status   Enterococcus species DETECTED (A) NOT DETECTED Final    Comment: CRITICAL RESULT CALLED TO, READ BACK BY AND VERIFIED WITH: L POWELL PHARMD 1800 11/17/15 A BROWNING    Vancomycin resistance NOT DETECTED NOT DETECTED Final   Listeria monocytogenes NOT DETECTED NOT DETECTED Final   Staphylococcus species DETECTED (A) NOT DETECTED Final    Comment: CRITICAL RESULT CALLED TO, READ BACK BY AND VERIFIED WITH: L POWELL PHARMD 1800 11/17/15 A BROWNING    Staphylococcus aureus DETECTED (A) NOT DETECTED Final    Comment: CRITICAL RESULT CALLED TO, READ BACK BY AND VERIFIED WITH: L POWELL PHARMD 1800 11/17/15 A BROWNING    Methicillin resistance NOT DETECTED NOT DETECTED Final   Streptococcus species NOT DETECTED NOT DETECTED Final   Streptococcus  agalactiae NOT DETECTED NOT DETECTED Final   Streptococcus pneumoniae NOT DETECTED NOT DETECTED Final   Streptococcus pyogenes NOT DETECTED NOT DETECTED Final   Acinetobacter baumannii NOT DETECTED NOT DETECTED Final   Enterobacteriaceae species NOT DETECTED NOT DETECTED Final   Enterobacter cloacae complex NOT DETECTED NOT DETECTED Final   Escherichia coli NOT DETECTED NOT DETECTED Final   Klebsiella oxytoca NOT DETECTED NOT DETECTED Final   Klebsiella pneumoniae NOT DETECTED NOT DETECTED Final   Proteus species NOT DETECTED NOT DETECTED Final   Serratia marcescens NOT DETECTED NOT DETECTED Final   Carbapenem resistance NOT DETECTED NOT DETECTED Final   Haemophilus influenzae NOT DETECTED NOT DETECTED Final   Neisseria meningitidis NOT DETECTED NOT DETECTED Final   Pseudomonas aeruginosa NOT DETECTED NOT DETECTED Final   Candida albicans NOT DETECTED NOT DETECTED Final   Candida glabrata NOT DETECTED NOT DETECTED Final   Candida krusei NOT DETECTED NOT DETECTED Final   Candida parapsilosis NOT DETECTED NOT DETECTED Final   Candida tropicalis NOT DETECTED NOT DETECTED Final    Comment: Performed at [facility]  Urine culture     Status: Abnormal   Collection Time: 11/16/15  8:50 PM  Result Value Ref Range Status   Specimen Description URINE, RANDOM  Final   Special Requests NONE  Final   Culture MULTIPLE SPECIES PRESENT, SUGGEST RECOLLECTION (A)  Final   Report Status 11/18/2015 FINAL  Final  Blood Culture (routine x 2)     Status: Abnormal (Preliminary result)   Collection Time: 11/16/15  9:30 PM  Result Value Ref Range Status   Specimen Description BLOOD LEFT FOREARM  Final   Special Requests BOTTLES DRAWN AEROBIC AND ANAEROBIC 5CC  Final   Culture  Setup Time   Final    GRAM POSITIVE COCCI IN CLUSTERS IN BOTH AEROBIC AND ANAEROBIC BOTTLES CRITICAL RESULT CALLED TO, READ BACK BY AND VERIFIED WITH: L POWELL PHARMD 1800 11/17/15 A BROWNING    Culture (A)  Final     STAPHYLOCOCCUS AUREUS SUSCEPTIBILITIES PERFORMED ON PREVIOUS CULTURE WITHIN THE LAST 5 DAYS. CULTURE REINCUBATED FOR BETTER GROWTH Performed at [facility]    Report Status PENDING  Incomplete  MRSA PCR Screening     Status: Abnormal   Collection Time: 11/17/15 12:45 AM  Result Value Ref Range Status   MRSA by PCR POSITIVE (A) NEGATIVE Final    Comment:        The GeneXpert MRSA Assay (FDA approved for NASAL specimens only), is one component of a comprehensive  MRSA colonization surveillance program. It is not intended to diagnose MRSA infection nor to guide or monitor treatment for MRSA infections. RESULT CALLED TO, READ BACK BY AND VERIFIED WITH: K ABDUSSURAN,RN @0440  11/17/15 MKELLY   Culture, blood (x 2)     Status: Abnormal (Preliminary result)   Collection Time: 11/17/15  2:35 AM  Result Value Ref Range Status   Specimen Description BLOOD BLOOD LEFT HAND  Final   Special Requests IN PEDIATRIC BOTTLE 1CC  Final   Culture  Setup Time   Final    GRAM POSITIVE COCCI IN CLUSTERS AEROBIC BOTTLE ONLY CRITICAL VALUE NOTED.  VALUE IS CONSISTENT WITH PREVIOUSLY REPORTED AND CALLED VALUE.    Culture (A)  Final    STAPHYLOCOCCUS AUREUS SUSCEPTIBILITIES PERFORMED ON PREVIOUS CULTURE WITHIN THE LAST 5 DAYS. CULTURE REINCUBATED FOR BETTER GROWTH    Report Status PENDING  Incomplete  Culture, blood (x 2)     Status: Abnormal (Preliminary result)   Collection Time: 11/17/15  2:45 AM  Result Value Ref Range Status   Specimen Description BLOOD BLOOD RIGHT HAND  Final   Special Requests IN PEDIATRIC BOTTLE 2CC  Final   Culture  Setup Time   Final    GRAM POSITIVE COCCI IN CLUSTERS AEROBIC BOTTLE ONLY CRITICAL RESULT CALLED TO, READ BACK BY AND VERIFIED WITH: L POWELL PHARMD 1800 11/17/15 A BROWNING    Culture (A)  Final    STAPHYLOCOCCUS AUREUS SUSCEPTIBILITIES PERFORMED ON PREVIOUS CULTURE WITHIN THE LAST 5 DAYS. CULTURE REINCUBATED FOR BETTER GROWTH    Report Status  PENDING  Incomplete  Culture, blood (Routine X 2) w Reflex to ID Panel     Status: None (Preliminary result)   Collection Time: 11/18/15  7:10 AM  Result Value Ref Range Status   Specimen Description BLOOD RIGHT HAND  Final   Special Requests IN PEDIATRIC BOTTLE 3CC  Final   Culture  Setup Time   Final    GRAM POSITIVE COCCI IN CLUSTERS AEROBIC BOTTLE ONLY CRITICAL RESULT CALLED TO, READ BACK BY AND VERIFIED WITH: G ABBOTT PHARMD 2302 11/18/15 A BROWNING    Culture PENDING  Incomplete   Report Status PENDING  Incomplete  Culture, blood (Routine X 2) w Reflex to ID Panel     Status: None (Preliminary result)   Collection Time: 11/18/15  8:41 AM  Result Value Ref Range Status   Specimen Description BLOOD LEFT ARM  Final   Special Requests IN PEDIATRIC BOTTLE 3CC  Final   Culture  Setup Time   Final    GRAM POSITIVE COCCI IN CLUSTERS IN PEDIATRIC BOTTLE    Culture PENDING  Incomplete   Report Status PENDING  Incomplete     Scheduled Meds: . ampicillin-sulbactam (UNASYN) IV  3 g Intravenous Q12H  . Chlorhexidine Gluconate Cloth  6 each Topical Q0600  . feeding supplement (NEPRO CARB STEADY)  237 mL Oral BID BM  . folic acid  1 mg Intravenous Daily  . hydrocortisone sod succinate (SOLU-CORTEF) inj  100 mg Intravenous Q8H  . multivitamin with minerals  1 tablet Oral Daily  . mupirocin ointment  1 application Nasal BID  . nicotine  14 mg Transdermal Daily  . sodium chloride flush  3 mL Intravenous Q12H  . thiamine  100 mg Intravenous Daily   Continuous Infusions: . sodium chloride 75 mL/hr at 11/18/15 1607    Procedures/Studies: Dg Chest 2 View  Result Date: 11/16/2015 CLINICAL DATA:  Intermittent moderate cough beginning 2 days ago. Shortness breath.  Moderate back and rib pain. Next healed EXAM: CHEST  2 VIEW COMPARISON:  Two-view chest x-ray 04/10/2013. FINDINGS: Multi focal right lower lobe airspace disease is present. Additional right upper lobe airspace disease present.  There is prominence of the hila bilaterally. Linear densities are noted at the left base. The heart size is normal. The visualized soft tissues and bony thorax are unremarkable. IMPRESSION: 1. Multi focal pneumonia involving the right lower lobe, right upper lobe, and possibly left lower lobe. 2. Moderate hilar prominence likely representing vascular congestion neck and possible reactive adenopathy. 3. Recommend follow-up two-view chest x-ray following appropriate treatment therapy. Electronically Signed   By: Marin Roberts M.D.   On: 11/16/2015 21:32   Ct Chest Wo Contrast  Result Date: 11/18/2015 CLINICAL DATA:  30 year old female with history of pulmonary infiltrates. Pneumonia. History of substance abuse. EXAM: CT CHEST WITHOUT CONTRAST TECHNIQUE: Multidetector CT imaging of the chest was performed following the standard protocol without IV contrast. COMPARISON:  No prior chest CT.  Chest x-ray 11/16/2015. FINDINGS: Cardiovascular: Heart size is normal. There is no significant pericardial fluid, thickening or pericardial calcification. Mediastinum/Nodes: Multiple prominent mediastinal lymph nodes measuring up to 1.5 cm in short axis in the subcarinal nodal station. Please note that accurate exclusion of hilar adenopathy is limited on noncontrast CT scans. Esophagus is unremarkable in appearance. No axillary lymphadenopathy. Lungs/Pleura: Multifocal airspace consolidation is noted throughout the lungs bilaterally, most confluent in the posterior aspect of the right lower lobe. Several of the areas of apparent airspace consolidation are somewhat nodular in appearance, and there is one cavitary area in the right upper lobe (image 80 of series 3) which measures up to 1.7 cm in diameter, in addition to a smaller cavitary area in the periphery of the left lower lobe (image 89 of series 3). The overall spectrum of findings is highly concerning for multifocal pneumonia, presumably from septic emboli. Given  the somewhat wedge-shaped appearance of the largest area of consolidation in the right lower lobe, the possibility of significant ischemia/infarct in the right lower lobe related to a larger pulmonary embolus is considered likely (image 71 of series 3). Trace bilateral pleural effusions lie dependently. Upper Abdomen: Although incompletely visualized, the spleen appears enlarged measuring up to 13.8 x 5.1 cm on axial images. Musculoskeletal: There are no aggressive appearing lytic or blastic lesions noted in the visualized portions of the skeleton. IMPRESSION: 1. The overall spectrum of findings in the lungs is compatible with multilobar pneumonia, likely from septic embolization. 2. The largest area of airspace consolidation in the right lower lobe has a very wedge-shaped in appearanceand is concerning for potential pulmonary infarct from larger embolus. This could be further evaluated with PE protocol CT scan if clinically appropriate. 3. Trace bilateral pleural effusions lying dependently. 4. Splenomegaly. These results will be called to the ordering clinician or representative by the Radiologist Assistant, and communication documented in the PACS or zVision Dashboard. Electronically Signed   By: Trudie Reed M.D.   On: 11/18/2015 08:28   US Renal  Result Date: 11/17/2015 CLINICAL DATA:  Acute kidney injury.  Sepsis. EXAM: RENAL / URINARY TRACT ULTRASOUND COMPLETE COMPARISON:  None. FINDINGS: Right Kidney: Length: 13.8 cm, within normal limits. The kidney is somewhat echogenic. It is isoechoic to the index organ, the liver. No discrete mass lesion or stone is present. There is no hydronephrosis. Left Kidney: Length: 13.9 cm, within normal limits. The kidney is somewhat echogenic. It is isoechoic to the index organ, the spleen. No  discrete mass lesion or stone is present. There is no hydronephrosis. Bladder: Appears normal for degree of bladder distention. Incidental note is made of multiple shadowing  stones within the gallbladder. There is no significant wall thickening to suggest cholecystitis. IMPRESSION: 1. The kidneys are hyperechoic bilaterally. This is nonspecific, but can be seen in the setting of acute medical renal disease. 2. No renal calculus or mass lesion.  No hydronephrosis. 3. Cholelithiasis without evidence for cholecystitis. Electronically Signed   By: Marin Robertshristopher  Mattern M.D.   On: 11/17/2015 22:27   Nm Pulmonary Perf And Vent  Result Date: 11/17/2015 CLINICAL DATA:  Shortness of breath, hemoptysis, elevated D-dimer, history asthma EXAM: NUCLEAR MEDICINE BOWEL (MECKEL'S) SCAN TECHNIQUE: Sequential abdominal images were obtained following intravenous injection of radiopharmaceutical. RADIOPHARMACEUTICALS:  mCi Tc-5462m pertechnetate IV 30.3 mCi Technetium-4562m DTPA aerosol inhalation and 4.0 mCi Technetium-8162m MAA IV COMPARISON:  None. None Correlation:  Chest radiograph 11/16/2015 FINDINGS: Ventilation: Diminished ventilation at apices. Focal ventilation defect at medial LEFT lower lobe. Additional diminished ventilation in both lower lobes. Perfusion: Small subsegmental perfusion defect lateral RIGHT upper lobe corresponding to focal opacity on chest radiograph. Additional large segmental defect medial RIGHT middle lobe corresponding to infiltrate on the chest radiograph. Additional perfusion defects in BILATERAL lower lobes, though stripe signs are present at both. Chest radiograph demonstrates multifocal multifocal infiltrates bilaterally. Presence of matching ventilation, perfusion, and radiographic abnormalities represents an intermediate probability for pulmonary embolism. IMPRESSION: Matching ventilatory, perfusion and radiographic abnormalities compatible with an intermediate probability for pulmonary embolism. Electronically Signed   By: Ulyses SouthwardMark  Boles M.D.   On: 11/17/2015 12:03    Morgin Halls, DO  [facility] Pager [phone]  If 7PM-7AM, please contact  night-coverage [email or web address] Password [email or web address] 11/19/2015, 10:01 AM   LOS: 3 days

## 2015-11-19 NOTE — Progress Notes (Signed)
Patient ID: Margretta Ditty, female   DOB: 20-Sep-1985, 30 y.o.   MRN: [id number]          [facility]    Date of Admission:  11/16/2015   Total days of antibiotics 3        Day 2 ampicillin sulbactam         Principal Problem:   Endocarditis of native valve Active Problems:   Sepsis (HCC)   CAP (community acquired pneumonia)   Enterococcal bacteremia   Staphylococcus aureus bacteremia   Polysubstance dependence including opioid type drug with complication, continuous use (HCC)   Opioid use with withdrawal (HCC)   Hyponatremia   Acute renal failure (HCC)   Hypocalcemia   Nonspecific abnormal electrocardiogram (ECG) (EKG)   Thrombocytopenia (HCC)   Normocytic anemia   Protein-calorie malnutrition (HCC)   Poor dentition   AKI (acute kidney injury) (HCC)   Malnutrition of moderate degree   Polysubstance abuse   . ampicillin-sulbactam (UNASYN) IV  3 g Intravenous Q12H  . Chlorhexidine Gluconate Cloth  6 each Topical Q0600  . feeding supplement (NEPRO CARB STEADY)  237 mL Oral BID BM  . [START ON 11/20/2015] folic acid  1 mg Oral Daily  . hydrocortisone sod succinate (SOLU-CORTEF) inj  100 mg Intravenous Q8H  . multivitamin with minerals  1 tablet Oral Daily  . mupirocin ointment  1 application Nasal BID  . nicotine  14 mg Transdermal Daily  . sodium chloride flush  3 mL Intravenous Q12H  . [START ON 11/20/2015] thiamine  100 mg Oral Daily    SUBJECTIVE: She is feeling a little bit better. She does complain of some pain and is still having some cough.  Review of Systems: Review of Systems  Constitutional: Positive for malaise/fatigue. Negative for chills, diaphoresis and fever.  Respiratory: Positive for cough. Negative for sputum production and shortness of breath.   Cardiovascular: Negative for chest pain.  Neurological: Negative for headaches.    Past Medical History:  Diagnosis Date  . CAP (community acquired pneumonia) 11/16/2015  .  Childhood asthma   . Heroin abuse   . Pneumonia 2007  . Polysubstance abuse     Social History  Substance Use Topics  . Smoking status: Current Every Day Smoker    Packs/day: 0.50    Years: 10.00    Types: Cigarettes  . Smokeless tobacco: Never Used  . Alcohol use No    Family History  Problem Relation Age of Onset  . Anesthesia problems Neg Hx   . Hypotension Neg Hx   . Malignant hyperthermia Neg Hx   . Pseudochol deficiency Neg Hx    No Known Allergies  OBJECTIVE: Vitals:   11/18/15 2345 11/19/15 0400 11/19/15 0550 11/19/15 0843  BP: 107/74  113/79 102/65  Pulse:   78   Resp: (!) 26   (!) 30  Temp: 97.9 F (36.6 C) 97.7 F (36.5 C)  97.8 F (36.6 C)  TempSrc: Oral Oral  Oral  SpO2: 100%  100%   Weight:   98 lb 5.2 oz (44.6 kg)   Height:       Body mass index is 16.88 kg/m.  Physical Exam  Constitutional: She is oriented to person, place, and time. No distress.  She is more alert. She has 2 visitors.  Cardiovascular: Normal rate and regular rhythm.   No murmur heard. Pulmonary/Chest: Effort normal and breath sounds normal.  Abdominal: Soft. There is tenderness.  Musculoskeletal: Normal range of motion.  She exhibits no edema or tenderness.  Neurological: She is alert and oriented to person, place, and time.  Skin: No rash noted.  Psychiatric: Mood and affect normal.    Lab Results Lab Results  Component Value Date   WBC 9.0 11/19/2015   HGB 8.4 (L) 11/19/2015   HCT 26.4 (L) 11/19/2015   MCV 80.7 11/19/2015   PLT 74 (L) 11/19/2015    Lab Results  Component Value Date   CREATININE 3.98 (H) 11/19/2015   BUN 71 (H) 11/19/2015   NA 134 (L) 11/19/2015   K 5.0 11/19/2015   CL 111 11/19/2015   CO2 14 (L) 11/19/2015    Lab Results  Component Value Date   ALT 11 (L) 11/18/2015   AST 23 11/18/2015   ALKPHOS 50 11/18/2015   BILITOT 0.5 11/18/2015     Microbiology: Recent Results (from the past 240 hour(s))  Blood Culture (routine x 2)      Status: Abnormal (Preliminary result)   Collection Time: 11/16/15  8:30 PM  Result Value Ref Range Status   Specimen Description BLOOD RIGHT ARM  Final   Special Requests BOTTLES DRAWN AEROBIC AND ANAEROBIC 5CC  Final   Culture  Setup Time   Final    GRAM POSITIVE COCCI IN CLUSTERS IN BOTH AEROBIC AND ANAEROBIC BOTTLES Organism ID to follow CRITICAL RESULT CALLED TO, READ BACK BY AND VERIFIED WITH: L POWELL PHARMD 1800 11/17/15 A BROWNING    Culture (A)  Final    STAPHYLOCOCCUS AUREUS CULTURE REINCUBATED FOR BETTER GROWTH [facility]    Report Status PENDING  Incomplete   Organism ID, Bacteria STAPHYLOCOCCUS AUREUS  Final      Susceptibility   Staphylococcus aureus - MIC*    CIPROFLOXACIN <=0.5 SENSITIVE Sensitive     ERYTHROMYCIN <=0.25 SENSITIVE Sensitive     GENTAMICIN <=0.5 SENSITIVE Sensitive     OXACILLIN 0.5 SENSITIVE Sensitive     TETRACYCLINE <=1 SENSITIVE Sensitive     VANCOMYCIN <=0.5 SENSITIVE Sensitive     TRIMETH/SULFA <=10 SENSITIVE Sensitive     CLINDAMYCIN <=0.25 SENSITIVE Sensitive     RIFAMPIN <=0.5 SENSITIVE Sensitive     Inducible Clindamycin NEGATIVE Sensitive     * STAPHYLOCOCCUS AUREUS  Blood Culture ID Panel (Reflexed)     Status: Abnormal   Collection Time: 11/16/15  8:30 PM  Result Value Ref Range Status   Enterococcus species DETECTED (A) NOT DETECTED Final    Comment: CRITICAL RESULT CALLED TO, READ BACK BY AND VERIFIED WITH: L POWELL PHARMD 1800 11/17/15 A BROWNING    Vancomycin resistance NOT DETECTED NOT DETECTED Final   Listeria monocytogenes NOT DETECTED NOT DETECTED Final   Staphylococcus species DETECTED (A) NOT DETECTED Final    Comment: CRITICAL RESULT CALLED TO, READ BACK BY AND VERIFIED WITH: L POWELL PHARMD 1800 11/17/15 A BROWNING    Staphylococcus aureus DETECTED (A) NOT DETECTED Final    Comment: CRITICAL RESULT CALLED TO, READ BACK BY AND VERIFIED WITH: L POWELL PHARMD 1800 11/17/15 A BROWNING    Methicillin  resistance NOT DETECTED NOT DETECTED Final   Streptococcus species NOT DETECTED NOT DETECTED Final   Streptococcus agalactiae NOT DETECTED NOT DETECTED Final   Streptococcus pneumoniae NOT DETECTED NOT DETECTED Final   Streptococcus pyogenes NOT DETECTED NOT DETECTED Final   Acinetobacter baumannii NOT DETECTED NOT DETECTED Final   Enterobacteriaceae species NOT DETECTED NOT DETECTED Final   Enterobacter cloacae complex NOT DETECTED NOT DETECTED Final   Escherichia coli NOT  DETECTED NOT DETECTED Final   Klebsiella oxytoca NOT DETECTED NOT DETECTED Final   Klebsiella pneumoniae NOT DETECTED NOT DETECTED Final   Proteus species NOT DETECTED NOT DETECTED Final   Serratia marcescens NOT DETECTED NOT DETECTED Final   Carbapenem resistance NOT DETECTED NOT DETECTED Final   Haemophilus influenzae NOT DETECTED NOT DETECTED Final   Neisseria meningitidis NOT DETECTED NOT DETECTED Final   Pseudomonas aeruginosa NOT DETECTED NOT DETECTED Final   Candida albicans NOT DETECTED NOT DETECTED Final   Candida glabrata NOT DETECTED NOT DETECTED Final   Candida krusei NOT DETECTED NOT DETECTED Final   Candida parapsilosis NOT DETECTED NOT DETECTED Final   Candida tropicalis NOT DETECTED NOT DETECTED Final    Comment: Performed at [facility]  Urine culture     Status: Abnormal   Collection Time: 11/16/15  8:50 PM  Result Value Ref Range Status   Specimen Description URINE, RANDOM  Final   Special Requests NONE  Final   Culture MULTIPLE SPECIES PRESENT, SUGGEST RECOLLECTION (A)  Final   Report Status 11/18/2015 FINAL  Final  Blood Culture (routine x 2)     Status: Abnormal (Preliminary result)   Collection Time: 11/16/15  9:30 PM  Result Value Ref Range Status   Specimen Description BLOOD LEFT FOREARM  Final   Special Requests BOTTLES DRAWN AEROBIC AND ANAEROBIC 5CC  Final   Culture  Setup Time   Final    GRAM POSITIVE COCCI IN CLUSTERS IN BOTH AEROBIC AND ANAEROBIC BOTTLES CRITICAL RESULT  CALLED TO, READ BACK BY AND VERIFIED WITH: L POWELL PHARMD 1800 11/17/15 A BROWNING    Culture (A)  Final    STAPHYLOCOCCUS AUREUS SUSCEPTIBILITIES PERFORMED ON PREVIOUS CULTURE WITHIN THE LAST 5 DAYS. CULTURE REINCUBATED FOR BETTER GROWTH Performed at [facility]    Report Status PENDING  Incomplete  MRSA PCR Screening     Status: Abnormal   Collection Time: 11/17/15 12:45 AM  Result Value Ref Range Status   MRSA by PCR POSITIVE (A) NEGATIVE Final    Comment:        The GeneXpert MRSA Assay (FDA approved for NASAL specimens only), is one component of a comprehensive MRSA colonization surveillance program. It is not intended to diagnose MRSA infection nor to guide or monitor treatment for MRSA infections. RESULT CALLED TO, READ BACK BY AND VERIFIED WITH: K ABDUSSURAN,RN @0440  11/17/15 MKELLY   Culture, blood (x 2)     Status: Abnormal (Preliminary result)   Collection Time: 11/17/15  2:35 AM  Result Value Ref Range Status   Specimen Description BLOOD BLOOD LEFT HAND  Final   Special Requests IN PEDIATRIC BOTTLE 1CC  Final   Culture  Setup Time   Final    GRAM POSITIVE COCCI IN CLUSTERS AEROBIC BOTTLE ONLY CRITICAL VALUE NOTED.  VALUE IS CONSISTENT WITH PREVIOUSLY REPORTED AND CALLED VALUE.    Culture (A)  Final    STAPHYLOCOCCUS AUREUS SUSCEPTIBILITIES PERFORMED ON PREVIOUS CULTURE WITHIN THE LAST 5 DAYS. CULTURE REINCUBATED FOR BETTER GROWTH    Report Status PENDING  Incomplete  Culture, blood (x 2)     Status: Abnormal (Preliminary result)   Collection Time: 11/17/15  2:45 AM  Result Value Ref Range Status   Specimen Description BLOOD BLOOD RIGHT HAND  Final   Special Requests IN PEDIATRIC BOTTLE 2CC  Final   Culture  Setup Time   Final    GRAM POSITIVE COCCI IN CLUSTERS AEROBIC BOTTLE ONLY CRITICAL RESULT CALLED TO, READ BACK  BY AND VERIFIED WITH: L POWELL PHARMD 1800 11/17/15 A BROWNING    Culture (A)  Final    STAPHYLOCOCCUS AUREUS SUSCEPTIBILITIES  PERFORMED ON PREVIOUS CULTURE WITHIN THE LAST 5 DAYS. CULTURE REINCUBATED FOR BETTER GROWTH    Report Status PENDING  Incomplete  Culture, blood (Routine X 2) w Reflex to ID Panel     Status: None (Preliminary result)   Collection Time: 11/18/15  7:10 AM  Result Value Ref Range Status   Specimen Description BLOOD RIGHT HAND  Final   Special Requests IN PEDIATRIC BOTTLE 3CC  Final   Culture  Setup Time   Final    GRAM POSITIVE COCCI IN CLUSTERS AEROBIC BOTTLE ONLY CRITICAL RESULT CALLED TO, READ BACK BY AND VERIFIED WITH: G ABBOTT PHARMD 2302 11/18/15 A BROWNING    Culture   Final    GRAM POSITIVE COCCI CULTURE REINCUBATED FOR BETTER GROWTH    Report Status PENDING  Incomplete  Culture, blood (Routine X 2) w Reflex to ID Panel     Status: None (Preliminary result)   Collection Time: 11/18/15  8:41 AM  Result Value Ref Range Status   Specimen Description BLOOD LEFT ARM  Final   Special Requests IN PEDIATRIC BOTTLE 3CC  Final   Culture  Setup Time   Final    GRAM POSITIVE COCCI IN CLUSTERS IN PEDIATRIC BOTTLE CRITICAL RESULT CALLED TO, READ BACK BY AND VERIFIED WITH: G ABBOTT PHARMD 2302 11/18/15 A BROWNING    Culture   Final    GRAM POSITIVE COCCI CULTURE REINCUBATED FOR BETTER GROWTH    Report Status PENDING  Incomplete     ASSESSMENT: All 6 blood cultures that have been drawn and are positive. BioFire are identified MSSA and enterococcus but it looks like only MSSA is growing in cultures. Enterococcus may be insignificant but I will continue ampicillin sulbactam to cover both for now. Transthoracic echocardiogram shows 2 large, mobile vegetations on the tricuspid valve. I will order repeat blood cultures. I agree with TEE.  PLAN: 1. Continue ampicillin sulbactam 2. Repeat blood cultures 3. Please call Dr. Judyann Munsonynthia Snider [phone]) for any infectious disease questions this weekend  Cliffton AstersJohn Bryona Foxworthy, MD [facility] [phone] pager   [phone] 11/19/2015, 12:11 PM

## 2015-11-19 NOTE — Progress Notes (Signed)
PHARMACY - PHYSICIAN COMMUNICATION CRITICAL VALUE ALERT - BLOOD CULTURE IDENTIFICATION (BCID)  Results for orders placed or performed during the hospital encounter of 11/16/15  Blood Culture ID Panel (Reflexed) (Collected: 11/16/2015  8:30 PM)  Result Value Ref Range   Enterococcus species DETECTED (A) NOT DETECTED   Vancomycin resistance NOT DETECTED NOT DETECTED   Listeria monocytogenes NOT DETECTED NOT DETECTED   Staphylococcus species DETECTED (A) NOT DETECTED   Staphylococcus aureus DETECTED (A) NOT DETECTED   Methicillin resistance NOT DETECTED NOT DETECTED   Streptococcus species NOT DETECTED NOT DETECTED   Streptococcus agalactiae NOT DETECTED NOT DETECTED   Streptococcus pneumoniae NOT DETECTED NOT DETECTED   Streptococcus pyogenes NOT DETECTED NOT DETECTED   Acinetobacter baumannii NOT DETECTED NOT DETECTED   Enterobacteriaceae species NOT DETECTED NOT DETECTED   Enterobacter cloacae complex NOT DETECTED NOT DETECTED   Escherichia coli NOT DETECTED NOT DETECTED   Klebsiella oxytoca NOT DETECTED NOT DETECTED   Klebsiella pneumoniae NOT DETECTED NOT DETECTED   Proteus species NOT DETECTED NOT DETECTED   Serratia marcescens NOT DETECTED NOT DETECTED   Carbapenem resistance NOT DETECTED NOT DETECTED   Haemophilus influenzae NOT DETECTED NOT DETECTED   Neisseria meningitidis NOT DETECTED NOT DETECTED   Pseudomonas aeruginosa NOT DETECTED NOT DETECTED   Candida albicans NOT DETECTED NOT DETECTED   Candida glabrata NOT DETECTED NOT DETECTED   Candida krusei NOT DETECTED NOT DETECTED   Candida parapsilosis NOT DETECTED NOT DETECTED   Candida tropicalis NOT DETECTED NOT DETECTED    Name of physician (or Provider) Contacted:  N/A  Changes to prescribed antibiotics required:   None.  Contacted by lab for results of repeat Blood cultures 8/17 for [facility] in clusters.  Already on Unasyn based on results of 8/16 BC showing MSSA and Enterococcus  Eddie Candlebbott, Naureen Benton [place] 11/19/2015   12:26 AM

## 2015-11-19 NOTE — Progress Notes (Signed)
    [facility] has been requested to perform a transesophageal echocardiogram on Giliana N Crisostomo for bacteremia on 11/22/15 at 11am with Dr. Eden EmmsNishan After careful review of history and examination, the risks and benefits of transesophageal echocardiogram have been explained including risks of esophageal damage, perforation (1:10,000 risk), bleeding, pharyngeal hematoma as well as other potential complications associated with conscious sedation including aspiration, arrhythmia, respiratory failure and death. Alternatives to treatment were discussed, questions were answered. Patient is willing to proceed. NPO after midnight.   Cline CrockKathryn Dailynn Nancarrow, PA-C 11/19/2015 3:36 PM

## 2015-11-20 LAB — BASIC METABOLIC PANEL
Anion gap: 8 (ref 5–15)
BUN: 63 mg/dL — AB (ref 6–20)
CHLORIDE: 110 mmol/L (ref 101–111)
CO2: 17 mmol/L — ABNORMAL LOW (ref 22–32)
CREATININE: 3.15 mg/dL — AB (ref 0.44–1.00)
Calcium: 7.5 mg/dL — ABNORMAL LOW (ref 8.9–10.3)
GFR calc Af Amer: 22 mL/min — ABNORMAL LOW (ref >60)
GFR, EST NON AFRICAN AMERICAN: 19 mL/min — AB (ref >60)
GLUCOSE: 170 mg/dL — AB (ref 65–99)
POTASSIUM: 4.8 mmol/L (ref 3.5–5.1)
SODIUM: 135 mmol/L (ref 135–145)

## 2015-11-20 LAB — CULTURE, BLOOD (ROUTINE X 2)

## 2015-11-20 LAB — HEPATITIS B SURFACE ANTIBODY, QUANTITATIVE: HEPATITIS B-POST: 253.8 m[IU]/mL

## 2015-11-20 LAB — MAGNESIUM: MAGNESIUM: 2.3 mg/dL (ref 1.7–2.4)

## 2015-11-20 NOTE — Progress Notes (Signed)
PROGRESS NOTE  Catherine Hernandez ZOX:[id number] DOB: [birth year] DOA: 11/16/2015 PCP: No PCP Per Patient  Brief History: 30 year old female with a history of polysubstance abuse (cocaine, heroin, tobacco, marijuana), asthma presented with increasing shortness of breath and coughing. Her symptoms were associated with diffuse myalgias and poor oral intake. Apparently, the patient's last heroin and cocaine use was approximately 1 weeks prior to admission. Upon presentation. The patient was noted to be febrile with a fever up to 101.45F, hypertension, and tachycardia. Code sepsis was activated, and the patient was started on intravenous fluids and intravenous antibiotics.  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%, tricuspid valve vegetations -TEE scheduled for 11/22/2015  Pulmonary infiltrates/Septic emboli -V/Q scan--indeterminate -CT chest without contrast--multifocal nodular consolidations with cavitations likely septic emboli; RLL consolidation/wedge-shaped infarct -hemoptysis improved  Acute kidney injury -secondary to sepsis and hemodynamic changes -04/09/2013 serum creatinine 1.01 -11/16/2015 serum creatinine 4.43 -Renal ultrasound--bilateral echogenic kidneys; no hydronephrosis -UA with pyuria and proteinuria -check urine protein creatinine ratio--1.24  Hyponatremia -likely volume depletion -contine IVF -slowly improving -monitor serial Na levels  Thrombocytopenia -due to sepsis -HIV  antibody--neg -hep B and C--negative  Hypokalemia-->mild hyperkalemia -repleted-->remove K from IVF -check mag--2.1  Hypocalcemia -corrected calcium 8.0 -check 25 vitamin D--20.4  Hemoptysis -INR 1.45 -PTT 40 -trend Hgb -improved    Disposition Plan: Not stable for d/c Family Communication: Grandmonther updated at bedside 8/19  Consultants: ID, cardiology for TEE  Code Status: FULL  DVT Prophylaxis: SCDs   Procedures: As Listed in Progress Note Above  Antibiotics: Zosyn 8/15>>8/16 Vanco 8/15>>8/17 Unasyn 11/18/15>>>    Subjective: Patient is feeling better. Shortness of breath is better but still dyspneic with minimal exertion. Denies any fevers, chills, headache, neck pain, nausea, vomiting, diarrhea, abdominal pain. No dysuria or hematuria. No further hemoptysis. Still has some pleuritic chest discomfort on the left side.  Objective: Vitals:   11/20/15 0455 11/20/15 0700 11/20/15 0800 11/20/15 0900  BP:  109/88 (!) 122/93 110/80  Pulse:  71 66 66  Resp:  (!) 23 18 (!) 27  Temp:   98 F (36.7 C)   TempSrc:   Oral   SpO2:  97% 100% 98%  Weight: 48 kg (105 lb 13.1 oz)     Height:        Intake/Output Summary (Last 24 hours) at 11/20/15 1039 Last data filed at 11/20/15 0555  Gross per 24 hour  Intake             2580 ml  Output              800 ml  Net             1780 ml   Weight change: 3.4 kg (7 lb 7.9 oz) Exam:   General:  Pt is alert, follows commands appropriately, not in acute distress  HEENT: No icterus, No thrush, No neck mass, [place]/AT  Cardiovascular: RRR, S1/S2, no rubs, no gallops  Respiratory: Scattered bilateral crackles, right greater than left. No wheezing.  Abdomen: Soft/+BS, non tender, non distended, no guarding  Extremities: trace LE edema, No lymphangitis, No petechiae,  No rashes, no synovitis   Data Reviewed: I have personally reviewed following labs and imaging studies Basic Metabolic  Panel:  Recent Labs Lab 11/17/15 0819 11/17/15 0932 11/17/15 1536 11/17/15 2045 11/18/15 0840 11/19/15 1029 11/20/15 0334  NA 122* 121* 123* 126* 128* 134* 135  K 3.5 3.2* 4.0 5.5* 5.5* 5.0 4.8  CL 93* 92* 96* 102 105 111 110  CO2 15* 17* 16* 14* 14* 14* 17*  GLUCOSE 93 101* 126* 130* 145* 111* 170*  BUN 58* 59* 63* 68* 68* 71* 63*  CREATININE 4.46* 4.53* 4.44* 4.44* 4.72* 3.98* 3.15*  CALCIUM 6.6* 6.6* 6.7* 6.7* 6.8* 7.5* 7.5*  MG 2.1 2.1  --   --   --   --  2.3   Liver Function Tests:  Recent Labs Lab 11/16/15 2030 11/18/15 0840  AST 33 23  ALT 12* 11*  ALKPHOS 129* 50  BILITOT 0.7 0.5  PROT 6.5 5.1*  ALBUMIN 2.1* 1.4*   No results for input(s): LIPASE, AMYLASE in the last 168 hours. No results for input(s): AMMONIA in the last 168 hours. Coagulation Profile:  Recent Labs Lab 11/17/15 0243  INR 1.45   CBC:  Recent Labs Lab 11/16/15 2030 11/17/15 0932 11/18/15 1055 11/19/15 1029  WBC 8.9 8.3 8.9 9.0  NEUTROABS 7.3  --   --   --   HGB 10.9* 8.0* 8.5* 8.4*  HCT 30.9* 24.3* 25.2* 26.4*  MCV 75.6* 77.6* 78.3 80.7  PLT 30* 34* 44* 74*   Cardiac Enzymes:  Recent Labs Lab 11/16/15 2030 11/17/15 0243 11/17/15 0819 11/17/15 0932 11/17/15 1536  CKTOTAL 17*  --   --   --   --   TROPONINI 0.04* 0.03* 0.06* <0.03 <0.03   BNP: Invalid input(s): POCBNP CBG: No results for input(s): GLUCAP in the last 168 hours. HbA1C: No results for input(s): HGBA1C in the last 72 hours. Urine analysis:    Component Value Date/Time   COLORURINE YELLOW 11/16/2015 2050   APPEARANCEUR TURBID (A) 11/16/2015 2050   LABSPEC 1.015 11/16/2015 2050   PHURINE 5.0 11/16/2015 2050   GLUCOSEU NEGATIVE 11/16/2015 2050   HGBUR SMALL (A) 11/16/2015 2050   BILIRUBINUR NEGATIVE 11/16/2015 2050   KETONESUR NEGATIVE 11/16/2015 2050   PROTEINUR 100 (A) 11/16/2015 2050   UROBILINOGEN 0.2 01/07/2012 1140   NITRITE NEGATIVE 11/16/2015 2050   LEUKOCYTESUR SMALL (A) 11/16/2015 2050    Sepsis Labs: @LABRCNTIP (procalcitonin:4,lacticidven:4) ) Recent Results (from the past 240 hour(s))  Blood Culture (routine x 2)     Status: Abnormal (Preliminary result)   Collection Time: 11/16/15  8:30 PM  Result Value Ref Range Status   Specimen Description BLOOD RIGHT ARM  Final   Special Requests BOTTLES DRAWN AEROBIC AND ANAEROBIC 5CC  Final   Culture  Setup Time   Final    GRAM POSITIVE COCCI IN CLUSTERS IN BOTH AEROBIC AND ANAEROBIC BOTTLES CRITICAL RESULT CALLED TO, READ BACK BY AND VERIFIED WITH: L POWELL PHARMD 1800 11/17/15 A BROWNING    Culture (A)  Final    STAPHYLOCOCCUS AUREUS ENTEROCOCCUS SPECIES CULTURE REINCUBATED FOR BETTER GROWTH [facility]    Report Status PENDING  Incomplete   Organism ID, Bacteria STAPHYLOCOCCUS AUREUS  Final      Susceptibility   Staphylococcus aureus - MIC*    CIPROFLOXACIN <=0.5 SENSITIVE Sensitive     ERYTHROMYCIN <=0.25 SENSITIVE Sensitive     GENTAMICIN <=0.5 SENSITIVE Sensitive     OXACILLIN 0.5 SENSITIVE Sensitive     TETRACYCLINE <=  1 SENSITIVE Sensitive     VANCOMYCIN <=0.5 SENSITIVE Sensitive     TRIMETH/SULFA <=10 SENSITIVE Sensitive     CLINDAMYCIN <=0.25 SENSITIVE Sensitive     RIFAMPIN <=0.5 SENSITIVE Sensitive     Inducible Clindamycin NEGATIVE Sensitive     * STAPHYLOCOCCUS AUREUS  Blood Culture ID Panel (Reflexed)     Status: Abnormal   Collection Time: 11/16/15  8:30 PM  Result Value Ref Range Status   Enterococcus species DETECTED (A) NOT DETECTED Final    Comment: CRITICAL RESULT CALLED TO, READ BACK BY AND VERIFIED WITH: L POWELL PHARMD 1800 11/17/15 A BROWNING    Vancomycin resistance NOT DETECTED NOT DETECTED Final   Listeria monocytogenes NOT DETECTED NOT DETECTED Final   Staphylococcus species DETECTED (A) NOT DETECTED Final    Comment: CRITICAL RESULT CALLED TO, READ BACK BY AND VERIFIED WITH: L POWELL PHARMD 1800 11/17/15 A BROWNING    Staphylococcus aureus DETECTED (A) NOT  DETECTED Final    Comment: CRITICAL RESULT CALLED TO, READ BACK BY AND VERIFIED WITH: L POWELL PHARMD 1800 11/17/15 A BROWNING    Methicillin resistance NOT DETECTED NOT DETECTED Final   Streptococcus species NOT DETECTED NOT DETECTED Final   Streptococcus agalactiae NOT DETECTED NOT DETECTED Final   Streptococcus pneumoniae NOT DETECTED NOT DETECTED Final   Streptococcus pyogenes NOT DETECTED NOT DETECTED Final   Acinetobacter baumannii NOT DETECTED NOT DETECTED Final   Enterobacteriaceae species NOT DETECTED NOT DETECTED Final   Enterobacter cloacae complex NOT DETECTED NOT DETECTED Final   Escherichia coli NOT DETECTED NOT DETECTED Final   Klebsiella oxytoca NOT DETECTED NOT DETECTED Final   Klebsiella pneumoniae NOT DETECTED NOT DETECTED Final   Proteus species NOT DETECTED NOT DETECTED Final   Serratia marcescens NOT DETECTED NOT DETECTED Final   Carbapenem resistance NOT DETECTED NOT DETECTED Final   Haemophilus influenzae NOT DETECTED NOT DETECTED Final   Neisseria meningitidis NOT DETECTED NOT DETECTED Final   Pseudomonas aeruginosa NOT DETECTED NOT DETECTED Final   Candida albicans NOT DETECTED NOT DETECTED Final   Candida glabrata NOT DETECTED NOT DETECTED Final   Candida krusei NOT DETECTED NOT DETECTED Final   Candida parapsilosis NOT DETECTED NOT DETECTED Final   Candida tropicalis NOT DETECTED NOT DETECTED Final    Comment: Performed [facility]  Urine culture     Status: Abnormal   Collection Time: 11/16/15  8:50 PM  Result Value Ref Range Status   Specimen Description URINE, RANDOM  Final   Special Requests NONE  Final   Culture MULTIPLE SPECIES PRESENT, SUGGEST RECOLLECTION (A)  Final   Report Status 11/18/2015 FINAL  Final  Blood Culture (routine x 2)     Status: Abnormal   Collection Time: 11/16/15  9:30 PM  Result Value Ref Range Status   Specimen Description BLOOD LEFT FOREARM  Final   Special Requests BOTTLES DRAWN AEROBIC AND ANAEROBIC 5CC  Final    Culture  Setup Time   Final    GRAM POSITIVE COCCI IN CLUSTERS IN BOTH AEROBIC AND ANAEROBIC BOTTLES CRITICAL RESULT CALLED TO, READ BACK BY AND VERIFIED WITH: L POWELL PHARMD 1800 11/17/15 A BROWNING    Culture (A)  Final    STAPHYLOCOCCUS AUREUS SUSCEPTIBILITIES PERFORMED ON PREVIOUS CULTURE WITHIN THE LAST 5 DAYS. Performed [facility]    Report Status 11/20/2015 FINAL  Final  MRSA PCR Screening     Status: Abnormal   Collection Time: 11/17/15 12:45 AM  Result Value Ref Range Status  MRSA by PCR POSITIVE (A) NEGATIVE Final    Comment:        The GeneXpert MRSA Assay (FDA approved for NASAL specimens only), is one component of a comprehensive MRSA colonization surveillance program. It is not intended to diagnose MRSA infection nor to guide or monitor treatment for MRSA infections. RESULT CALLED TO, READ BACK BY AND VERIFIED WITH: K ABDUSSURAN,RN @0440  11/17/15 MKELLY   Culture, blood (x 2)     Status: Abnormal   Collection Time: 11/17/15  2:35 AM  Result Value Ref Range Status   Specimen Description BLOOD BLOOD LEFT HAND  Final   Special Requests IN PEDIATRIC BOTTLE 1CC  Final   Culture  Setup Time   Final    GRAM POSITIVE COCCI IN CLUSTERS AEROBIC BOTTLE ONLY CRITICAL VALUE NOTED.  VALUE IS CONSISTENT WITH PREVIOUSLY REPORTED AND CALLED VALUE.    Culture (A)  Final    STAPHYLOCOCCUS AUREUS SUSCEPTIBILITIES PERFORMED ON PREVIOUS CULTURE WITHIN THE LAST 5 DAYS.    Report Status 11/20/2015 FINAL  Final  Culture, blood (x 2)     Status: Abnormal   Collection Time: 11/17/15  2:45 AM  Result Value Ref Range Status   Specimen Description BLOOD BLOOD RIGHT HAND  Final   Special Requests IN PEDIATRIC BOTTLE 2CC  Final   Culture  Setup Time   Final    GRAM POSITIVE COCCI IN CLUSTERS AEROBIC BOTTLE ONLY CRITICAL RESULT CALLED TO, READ BACK BY AND VERIFIED WITH: L POWELL PHARMD 1800 11/17/15 A BROWNING    Culture (A)  Final    STAPHYLOCOCCUS  AUREUS SUSCEPTIBILITIES PERFORMED ON PREVIOUS CULTURE WITHIN THE LAST 5 DAYS.    Report Status 11/20/2015 FINAL  Final  Culture, blood (Routine X 2) w Reflex to ID Panel     Status: Abnormal (Preliminary result)   Collection Time: 11/18/15  7:10 AM  Result Value Ref Range Status   Specimen Description BLOOD RIGHT HAND  Final   Special Requests IN PEDIATRIC BOTTLE 3CC  Final   Culture  Setup Time   Final    GRAM POSITIVE COCCI IN CLUSTERS AEROBIC BOTTLE ONLY CRITICAL RESULT CALLED TO, READ BACK BY AND VERIFIED WITH: G ABBOTT PHARMD 2302 11/18/15 A BROWNING    Culture (A)  Final    STAPHYLOCOCCUS AUREUS SUSCEPTIBILITIES PERFORMED ON PREVIOUS CULTURE WITHIN THE LAST 5 DAYS. ENTEROCOCCUS SPECIES SUSCEPTIBILITIES TO FOLLOW    Report Status PENDING  Incomplete  Culture, blood (Routine X 2) w Reflex to ID Panel     Status: Abnormal   Collection Time: 11/18/15  8:41 AM  Result Value Ref Range Status   Specimen Description BLOOD LEFT ARM  Final   Special Requests IN PEDIATRIC BOTTLE 3CC  Final   Culture  Setup Time   Final    GRAM POSITIVE COCCI IN CLUSTERS IN PEDIATRIC BOTTLE CRITICAL RESULT CALLED TO, READ BACK BY AND VERIFIED WITH: G ABBOTT PHARMD 2302 11/18/15 A BROWNING    Culture (A)  Final    STAPHYLOCOCCUS AUREUS SUSCEPTIBILITIES PERFORMED ON PREVIOUS CULTURE WITHIN THE LAST 5 DAYS.    Report Status 11/20/2015 FINAL  Final     Scheduled Meds: . ampicillin-sulbactam (UNASYN) IV  3 g Intravenous Q12H  . Chlorhexidine Gluconate Cloth  6 each Topical Q0600  . feeding supplement (NEPRO CARB STEADY)  237 mL Oral BID BM  . folic acid  1 mg Oral Daily  . hydrocortisone sod succinate (SOLU-CORTEF) inj  100 mg Intravenous Q8H  . multivitamin with minerals  1 tablet Oral Daily  . mupirocin ointment  1 application Nasal BID  . nicotine  14 mg Transdermal Daily  . sodium chloride flush  3 mL Intravenous Q12H  . thiamine  100 mg Oral Daily   Continuous Infusions: . sodium chloride  75 mL/hr at 11/20/15 0455    Procedures/Studies: Dg Chest 2 View  Result Date: 11/16/2015 CLINICAL DATA:  Intermittent moderate cough beginning 2 days ago. Shortness breath. Moderate back and rib pain. Next healed EXAM: CHEST  2 VIEW COMPARISON:  Two-view chest x-ray 04/10/2013. FINDINGS: Multi focal right lower lobe airspace disease is present. Additional right upper lobe airspace disease present. There is prominence of the hila bilaterally. Linear densities are noted at the left base. The heart size is normal. The visualized soft tissues and bony thorax are unremarkable. IMPRESSION: 1. Multi focal pneumonia involving the right lower lobe, right upper lobe, and possibly left lower lobe. 2. Moderate hilar prominence likely representing vascular congestion neck and possible reactive adenopathy. 3. Recommend follow-up two-view chest x-ray following appropriate treatment therapy. Electronically Signed   By: Marin Roberts M.D.   On: 11/16/2015 21:32   Ct Chest Wo Contrast  Result Date: 11/18/2015 CLINICAL DATA:  30 year old female with history of pulmonary infiltrates. Pneumonia. History of substance abuse. EXAM: CT CHEST WITHOUT CONTRAST TECHNIQUE: Multidetector CT imaging of the chest was performed following the standard protocol without IV contrast. COMPARISON:  No prior chest CT.  Chest x-ray 11/16/2015. FINDINGS: Cardiovascular: Heart size is normal. There is no significant pericardial fluid, thickening or pericardial calcification. Mediastinum/Nodes: Multiple prominent mediastinal lymph nodes measuring up to 1.5 cm in short axis in the subcarinal nodal station. Please note that accurate exclusion of hilar adenopathy is limited on noncontrast CT scans. Esophagus is unremarkable in appearance. No axillary lymphadenopathy. Lungs/Pleura: Multifocal airspace consolidation is noted throughout the lungs bilaterally, most confluent in the posterior aspect of the right lower lobe. Several of the areas of  apparent airspace consolidation are somewhat nodular in appearance, and there is one cavitary area in the right upper lobe (image 80 of series 3) which measures up to 1.7 cm in diameter, in addition to a smaller cavitary area in the periphery of the left lower lobe (image 89 of series 3). The overall spectrum of findings is highly concerning for multifocal pneumonia, presumably from septic emboli. Given the somewhat wedge-shaped appearance of the largest area of consolidation in the right lower lobe, the possibility of significant ischemia/infarct in the right lower lobe related to a larger pulmonary embolus is considered likely (image 71 of series 3). Trace bilateral pleural effusions lie dependently. Upper Abdomen: Although incompletely visualized, the spleen appears enlarged measuring up to 13.8 x 5.1 cm on axial images. Musculoskeletal: There are no aggressive appearing lytic or blastic lesions noted in the visualized portions of the skeleton. IMPRESSION: 1. The overall spectrum of findings in the lungs is compatible with multilobar pneumonia, likely from septic embolization. 2. The largest area of airspace consolidation in the right lower lobe has a very wedge-shaped in appearanceand is concerning for potential pulmonary infarct from larger embolus. This could be further evaluated with PE protocol CT scan if clinically appropriate. 3. Trace bilateral pleural effusions lying dependently. 4. Splenomegaly. These results will be called to the ordering clinician or representative by the Radiologist Assistant, and communication documented in the PACS or zVision Dashboard. Electronically Signed   By: Trudie Reed M.D.   On: 11/18/2015 08:28   US Renal  Result Date: 11/17/2015 CLINICAL  DATA:  Acute kidney injury.  Sepsis. EXAM: RENAL / URINARY TRACT ULTRASOUND COMPLETE COMPARISON:  None. FINDINGS: Right Kidney: Length: 13.8 cm, within normal limits. The kidney is somewhat echogenic. It is isoechoic to the index  organ, the liver. No discrete mass lesion or stone is present. There is no hydronephrosis. Left Kidney: Length: 13.9 cm, within normal limits. The kidney is somewhat echogenic. It is isoechoic to the index organ, the spleen. No discrete mass lesion or stone is present. There is no hydronephrosis. Bladder: Appears normal for degree of bladder distention. Incidental note is made of multiple shadowing stones within the gallbladder. There is no significant wall thickening to suggest cholecystitis. IMPRESSION: 1. The kidneys are hyperechoic bilaterally. This is nonspecific, but can be seen in the setting of acute medical renal disease. 2. No renal calculus or mass lesion.  No hydronephrosis. 3. Cholelithiasis without evidence for cholecystitis. Electronically Signed   By: Marin Roberts M.D.   On: 11/17/2015 22:27   Nm Pulmonary Perf And Vent  Result Date: 11/17/2015 CLINICAL DATA:  Shortness of breath, hemoptysis, elevated D-dimer, history asthma EXAM: NUCLEAR MEDICINE BOWEL (MECKEL'S) SCAN TECHNIQUE: Sequential abdominal images were obtained following intravenous injection of radiopharmaceutical. RADIOPHARMACEUTICALS:  mCi Tc-82m pertechnetate IV 30.3 mCi Technetium-63m DTPA aerosol inhalation and 4.0 mCi Technetium-44m MAA IV COMPARISON:  None. None Correlation:  Chest radiograph 11/16/2015 FINDINGS: Ventilation: Diminished ventilation at apices. Focal ventilation defect at medial LEFT lower lobe. Additional diminished ventilation in both lower lobes. Perfusion: Small subsegmental perfusion defect lateral RIGHT upper lobe corresponding to focal opacity on chest radiograph. Additional large segmental defect medial RIGHT middle lobe corresponding to infiltrate on the chest radiograph. Additional perfusion defects in BILATERAL lower lobes, though stripe signs are present at both. Chest radiograph demonstrates multifocal multifocal infiltrates bilaterally. Presence of matching ventilation, perfusion, and  radiographic abnormalities represents an intermediate probability for pulmonary embolism. IMPRESSION: Matching ventilatory, perfusion and radiographic abnormalities compatible with an intermediate probability for pulmonary embolism. Electronically Signed   By: Ulyses Southward M.D.   On: 11/17/2015 12:03   Dg Chest Port 1 View  Result Date: 11/19/2015 CLINICAL DATA:  Shortness of breath and chest pain EXAM: PORTABLE CHEST 1 VIEW COMPARISON:  11/17/2015 FINDINGS: Cardiac shadow is stable. Persistent right lower lobe infiltrate is noted. Patchy changes are noted bilaterally consistent with that seen on recent CT examination. No acute bony abnormality is noted. IMPRESSION: Patchy changes bilaterally, worst in the right lower lobe consistent with multifocal pneumonia. Electronically Signed   By: Alcide Clever M.D.   On: 11/19/2015 11:21    Romaine Maciolek, DO  [facility] Pager [phone]  If 7PM-7AM, please contact night-coverage [email or web address] Password [email or web address] 11/20/2015, 10:39 AM   LOS: 4 days

## 2015-11-21 LAB — CULTURE, BLOOD (ROUTINE X 2)

## 2015-11-21 LAB — BASIC METABOLIC PANEL
ANION GAP: 8 (ref 5–15)
BUN: 46 mg/dL — ABNORMAL HIGH (ref 6–20)
CALCIUM: 8 mg/dL — AB (ref 8.9–10.3)
CO2: 15 mmol/L — ABNORMAL LOW (ref 22–32)
CREATININE: 1.92 mg/dL — AB (ref 0.44–1.00)
Chloride: 114 mmol/L — ABNORMAL HIGH (ref 101–111)
GFR, EST AFRICAN AMERICAN: 40 mL/min — AB (ref >60)
GFR, EST NON AFRICAN AMERICAN: 34 mL/min — AB (ref >60)
Glucose, Bld: 148 mg/dL — ABNORMAL HIGH (ref 65–99)
Potassium: 4 mmol/L (ref 3.5–5.1)
SODIUM: 137 mmol/L (ref 135–145)

## 2015-11-21 MED ORDER — HYDROCORTISONE NA SUCCINATE PF 100 MG IJ SOLR
50.0000 mg | Freq: Two times a day (BID) | INTRAMUSCULAR | Status: AC
Start: 2015-11-22 — End: 2015-11-23
  Administered 2015-11-22 – 2015-11-23 (×4): 50 mg via INTRAVENOUS
  Filled 2015-11-21 (×4): qty 2

## 2015-11-21 MED ORDER — SODIUM CHLORIDE 0.9 % IV SOLN
3.0000 g | Freq: Four times a day (QID) | INTRAVENOUS | Status: DC
  Administered 2015-11-21 – 2015-12-02 (×44): 3 g via INTRAVENOUS
  Filled 2015-11-21 (×49): qty 3

## 2015-11-21 MED ORDER — SODIUM BICARBONATE 650 MG PO TABS
650.0000 mg | ORAL_TABLET | Freq: Two times a day (BID) | ORAL | Status: DC
Start: 2015-11-21 — End: 2015-11-24
  Administered 2015-11-21 – 2015-11-24 (×6): 650 mg via ORAL
  Filled 2015-11-21 (×6): qty 1

## 2015-11-21 NOTE — Anesthesia Preprocedure Evaluation (Addendum)
Anesthesia Evaluation  Patient identified by MRN, date of birth, ID band Patient awake    Reviewed: Allergy & Precautions, NPO status , Patient's Chart, lab work & pertinent test results  Airway Mallampati: II  TM Distance: >3 FB Neck ROM: Full    Dental  (+) Dental Advisory Given   Pulmonary asthma (childhood) , Current Smoker,  -CT chest without contrast--multifocal nodular consolidations with cavitations likely septic emboli; RLL consolidation/wedge-shaped infarct -hemoptysis improved   breath sounds clear to auscultation       Cardiovascular  Rhythm:Regular Rate:Normal + Systolic murmurs TTE 11/18/2015: Study Conclusions  - Left ventricle: The cavity size was normal. Systolic function was   normal. The estimated ejection fraction was in the range of 55%   to 60%. Wall motion was normal; there were no regional wall   motion abnormalities. Left ventricular diastolic function   parameters were normal. - Right ventricle: The cavity size was normal. Wall thickness was   normal. Systolic function was normal. - Atrial septum: No defect or patent foramen ovale was identified. - Tricuspid valve: There were at least two large, mobile   vegetations on the atrial side of the tricuspid valve. The   vegetation on the septal leaflet measures 2.44 x 2.04 cm and on   the posterior lealet 2.77 x 1.93 cm There was moderate   regurgitation. - Pulmonary arteries: PA peak pressure: 35 mm Hg (S).  Impressions: - Findings consistent with endocarditis of the tricuspid valve.   Neuro/Psych    GI/Hepatic (+)     substance abuse  cocaine use, marijuana use and IV drug use,   Endo/Other    Renal/GU ARFRenal disease     Musculoskeletal   Abdominal   Peds  Hematology  (+) Blood dyscrasia (thrombocytopenia), anemia ,   Anesthesia Other Findings 30 year old female with a history of polysubstance abuse (cocaine, heroin, tobacco,  marijuana), asthma presented with increasing shortness of breath and coughing. Her symptoms were associated with diffuse myalgias and poor oral intake. Apparently, the patient's last heroin and cocaine use was approximately 1 weeks prior to admission. Upon presentation. The patient was noted to be febrile with a fever up to 101.31F, hypertension, and tachycardia. Code sepsis was activated, and the patient was started on intravenous fluids and intravenous antibiotics. The patient was found to have MRSA and Enterococcus faecalis bacteremia. ID was consulted to help manage antibiotics. The patient had tricuspid valve vegetation on TTE.   Reproductive/Obstetrics                           Anesthesia Physical Anesthesia Plan  ASA: IV  Anesthesia Plan: MAC   Post-op Pain Management:    Induction: Intravenous  Airway Management Planned: Natural Airway and Nasal Cannula  Additional Equipment:   Intra-op Plan:   Post-operative Plan:   Informed Consent:   Dental advisory given  Plan Discussed with:   Anesthesia Plan Comments:         Anesthesia Quick Evaluation

## 2015-11-21 NOTE — Progress Notes (Signed)
PROGRESS NOTE  RILIE GLANZ ZOX:[id number] DOB: April 10, 1985 DOA: 11/16/2015 PCP: No PCP Per Patient  Brief History: 30 year old female with a history of polysubstance abuse (cocaine, heroin, tobacco, marijuana), asthma presented with increasing shortness of breath and coughing. Her symptoms were associated with diffuse myalgias and poor oral intake. Apparently, the patient's last heroin and cocaine use was approximately [facility] priorto admission. Upon presentation. The patient was noted to be febrile with a fever up to 101.55F, hypertension, and tachycardia. Code sepsis was activated, and the patient was started on intravenous fluids and intravenous antibiotics. The patient was found to have MRSA and Enterococcus faecalis bacteremia. ID was consulted to help manage antibiotics. The patient had tricuspid valve vegetation on TTE.  TEE ordered.  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%, tricuspid valve vegetations -TEE scheduled for 11/22/2015  Pulmonary infiltrates/Septic emboli -V/Q scan--indeterminate -CT chest without contrast--multifocal nodular consolidations with cavitations likely septic emboli; RLL consolidation/wedge-shaped infarct -hemoptysis improved  Acute kidney injury -secondary to sepsis and hemodynamic changes -04/09/2013 serum creatinine 1.01 -11/16/2015 serum creatinine 4.43 -Renal ultrasound--bilateral  echogenic kidneys; no hydronephrosis -UA with pyuria and proteinuria -check urine protein creatinine ratio--1.24  Hyponatremia -likely volume depletion -improved -saline lock IVF and monitor  Thrombocytopenia -due to sepsis -HIV antibody--neg -hep B and C--negative -improving with tx of infection  Hypokalemia-->mild hyperkalemia -repleted-->remove K from IVF -check mag--2.1  Hypocalcemia -improved -check 25 vitamin D--20.4-->supplement  Hemoptysis -INR 1.45 -PTT 40 -trend Hgb -improved    Disposition Plan: transfer to tele Family Communication: Grandmonther updatedat bedside 8/19  Consultants: ID, cardiology for TEE  Code Status: FULL  DVT Prophylaxis: SCDs   Procedures: As Listed in Progress Note Above  Antibiotics: Zosyn 8/15>>8/16 Vanco 8/15>>8/17 Unasyn 11/18/15>>>   Subjective: Overall, patient is feeling better. She has still some mild pleuritic chest pain but it is improving. Denies any fever, chills, nausea, vomiting, diarrhea, abdominal pain. No dysuria or hematuria. No rashes.  Objective: Vitals:   11/20/15 2012 11/21/15 0342 11/21/15 0900 11/21/15 1300  BP: 111/81  110/72 115/71  Pulse: 64  60 69  Resp: (!) 35  (!) 25 (!) 23  Temp: 97.9 F (36.6 C) 98.8 F (37.1 C) 98.6 F (37 C) 97.7 F (36.5 C)  TempSrc: Oral Oral Oral Axillary  SpO2: 100%     Weight:      Height:        Intake/Output Summary (Last 24 hours) at 11/21/15 1600 Last data filed at 11/21/15 0402  Gross per 24 hour  Intake              885 ml  Output                0 ml  Net              885 ml   Weight change:  Exam:   General:  Pt is alert, follows commands appropriately, not in acute distress  HEENT: No icterus, No thrush, No neck mass, [place]/AT  Cardiovascular: RRR, S1/S2, no rubs, no gallops  Respiratory: Bilateral crackles. Good  air movement. No wheezing.  Abdomen: Soft/+BS, non tender, non distended, no guarding  Extremities: 1 +  LE edema, No lymphangitis, No petechiae, No rashes, no synovitis   Data Reviewed: I have personally reviewed following labs and imaging studies Basic Metabolic Panel:  Recent Labs Lab 11/17/15 0819 11/17/15 0932  11/17/15 2045 11/18/15 0840 11/19/15 1029 11/20/15 0334 11/21/15 1127  NA 122* 121*  < > 126* 128* 134* 135 137  K 3.5 3.2*  < > 5.5* 5.5* 5.0 4.8 4.0  CL 93* 92*  < > 102 105 111 110 114*  CO2 15* 17*  < > 14* 14* 14* 17* 15*  GLUCOSE 93 101*  < > 130* 145* 111* 170* 148*  BUN 58* 59*  < > 68* 68* 71* 63* 46*  CREATININE 4.46* 4.53*  < > 4.44* 4.72* 3.98* 3.15* 1.92*  CALCIUM 6.6* 6.6*  < > 6.7* 6.8* 7.5* 7.5* 8.0*  MG 2.1 2.1  --   --   --   --  2.3  --   < > = values in this interval not displayed. Liver Function Tests:  Recent Labs Lab 11/16/15 2030 11/18/15 0840  AST 33 23  ALT 12* 11*  ALKPHOS 129* 50  BILITOT 0.7 0.5  PROT 6.5 5.1*  ALBUMIN 2.1* 1.4*   No results for input(s): LIPASE, AMYLASE in the last 168 hours. No results for input(s): AMMONIA in the last 168 hours. Coagulation Profile:  Recent Labs Lab 11/17/15 0243  INR 1.45   CBC:  Recent Labs Lab 11/16/15 2030 11/17/15 0932 11/18/15 1055 11/19/15 1029  WBC 8.9 8.3 8.9 9.0  NEUTROABS 7.3  --   --   --   HGB 10.9* 8.0* 8.5* 8.4*  HCT 30.9* 24.3* 25.2* 26.4*  MCV 75.6* 77.6* 78.3 80.7  PLT 30* 34* 44* 74*   Cardiac Enzymes:  Recent Labs Lab 11/16/15 2030 11/17/15 0243 11/17/15 0819 11/17/15 0932 11/17/15 1536  CKTOTAL 17*  --   --   --   --   TROPONINI 0.04* 0.03* 0.06* <0.03 <0.03   BNP: Invalid input(s): POCBNP CBG: No results for input(s): GLUCAP in the last 168 hours. HbA1C: No results for input(s): HGBA1C in the last 72 hours. Urine analysis:    Component Value Date/Time   COLORURINE YELLOW 11/16/2015 2050   APPEARANCEUR TURBID (A) 11/16/2015 2050   LABSPEC 1.015 11/16/2015 2050   PHURINE 5.0 11/16/2015 2050   GLUCOSEU NEGATIVE 11/16/2015 2050   HGBUR  SMALL (A) 11/16/2015 2050   BILIRUBINUR NEGATIVE 11/16/2015 2050   KETONESUR NEGATIVE 11/16/2015 2050   PROTEINUR 100 (A) 11/16/2015 2050   UROBILINOGEN 0.2 01/07/2012 1140   NITRITE NEGATIVE 11/16/2015 2050   LEUKOCYTESUR SMALL (A) 11/16/2015 2050   Sepsis Labs: @LABRCNTIP (procalcitonin:4,lacticidven:4) ) Recent Results (from the past 240 hour(s))  Blood Culture (routine x 2)     Status: Abnormal (Preliminary result)   Collection Time: 11/16/15  8:30 PM  Result Value Ref Range Status   Specimen Description BLOOD RIGHT ARM  Final   Special Requests BOTTLES DRAWN AEROBIC AND ANAEROBIC 5CC  Final   Culture  Setup Time   Final    GRAM POSITIVE COCCI IN CLUSTERS IN BOTH AEROBIC AND ANAEROBIC BOTTLES CRITICAL RESULT CALLED TO, READ BACK BY AND VERIFIED WITH: L POWELL PHARMD 1800 11/17/15 A BROWNING    Culture (A)  Final    STAPHYLOCOCCUS AUREUS ENTEROCOCCUS SPECIES CULTURE REINCUBATED FOR BETTER GROWTH Performed at [facility]    Report Status PENDING  Incomplete   Organism ID, Bacteria STAPHYLOCOCCUS AUREUS  Final      Susceptibility   Staphylococcus aureus - MIC*    CIPROFLOXACIN <=0.5 SENSITIVE Sensitive     ERYTHROMYCIN <=0.25 SENSITIVE Sensitive     GENTAMICIN <=0.5 SENSITIVE Sensitive     OXACILLIN 0.5 SENSITIVE Sensitive     TETRACYCLINE <=1 SENSITIVE Sensitive     VANCOMYCIN <=0.5 SENSITIVE Sensitive     TRIMETH/SULFA <=10 SENSITIVE Sensitive     CLINDAMYCIN <=0.25 SENSITIVE Sensitive     RIFAMPIN <=0.5 SENSITIVE Sensitive     Inducible Clindamycin NEGATIVE Sensitive     * STAPHYLOCOCCUS AUREUS  Blood Culture ID Panel (Reflexed)     Status: Abnormal   Collection Time: 11/16/15  8:30 PM  Result Value Ref Range Status   Enterococcus species DETECTED (A) NOT DETECTED Final    Comment: CRITICAL RESULT CALLED TO, READ BACK BY AND VERIFIED WITH: L POWELL PHARMD 1800 11/17/15 A BROWNING    Vancomycin resistance NOT DETECTED NOT DETECTED Final   Listeria  monocytogenes NOT DETECTED NOT DETECTED Final   Staphylococcus species DETECTED (A) NOT DETECTED Final    Comment: CRITICAL RESULT CALLED TO, READ BACK BY AND VERIFIED WITH: L POWELL PHARMD 1800 11/17/15 A BROWNING    Staphylococcus aureus DETECTED (A) NOT DETECTED Final    Comment: CRITICAL RESULT CALLED TO, READ BACK BY AND VERIFIED WITH: L POWELL PHARMD 1800 11/17/15 A BROWNING    Methicillin resistance NOT DETECTED NOT DETECTED Final   Streptococcus species NOT DETECTED NOT DETECTED Final   Streptococcus agalactiae NOT DETECTED NOT DETECTED Final   Streptococcus pneumoniae NOT DETECTED NOT DETECTED Final   Streptococcus pyogenes NOT DETECTED NOT DETECTED Final   Acinetobacter baumannii NOT DETECTED NOT DETECTED Final   Enterobacteriaceae species NOT DETECTED NOT DETECTED Final   Enterobacter cloacae complex NOT DETECTED NOT DETECTED Final   Escherichia coli NOT DETECTED NOT DETECTED Final   Klebsiella oxytoca NOT DETECTED NOT DETECTED Final   Klebsiella pneumoniae NOT DETECTED NOT DETECTED Final   Proteus species NOT DETECTED NOT DETECTED Final   Serratia marcescens NOT DETECTED NOT DETECTED Final   Carbapenem resistance NOT DETECTED NOT DETECTED Final   Haemophilus influenzae NOT DETECTED NOT DETECTED Final   Neisseria meningitidis NOT DETECTED NOT DETECTED Final   Pseudomonas aeruginosa NOT DETECTED NOT DETECTED Final   Candida albicans NOT DETECTED NOT DETECTED Final   Candida glabrata NOT DETECTED NOT DETECTED Final   Candida krusei NOT DETECTED NOT DETECTED Final   Candida parapsilosis NOT DETECTED NOT DETECTED Final   Candida tropicalis NOT DETECTED NOT DETECTED Final    Comment: Performed at [facility]  Urine culture     Status: Abnormal   Collection Time: 11/16/15  8:50 PM  Result Value Ref Range Status   Specimen Description URINE, RANDOM  Final   Special Requests NONE  Final   Culture MULTIPLE SPECIES PRESENT, SUGGEST RECOLLECTION (A)  Final   Report Status  11/18/2015 FINAL  Final  Blood Culture (routine x 2)     Status: Abnormal   Collection Time: 11/16/15  9:30 PM  Result Value Ref Range Status   Specimen Description BLOOD LEFT FOREARM  Final   Special Requests BOTTLES DRAWN AEROBIC AND ANAEROBIC 5CC  Final   Culture  Setup Time   Final    GRAM POSITIVE COCCI IN CLUSTERS IN BOTH AEROBIC AND ANAEROBIC BOTTLES CRITICAL RESULT CALLED TO, READ BACK BY AND VERIFIED WITH: L POWELL PHARMD 1800 11/17/15 A BROWNING  Culture (A)  Final    STAPHYLOCOCCUS AUREUS SUSCEPTIBILITIES PERFORMED ON PREVIOUS CULTURE WITHIN THE LAST 5 DAYS. Performed [facility]    Report Status 11/20/2015 FINAL  Final  MRSA PCR Screening     Status: Abnormal   Collection Time: 11/17/15 12:45 AM  Result Value Ref Range Status   MRSA by PCR POSITIVE (A) NEGATIVE Final    Comment:        The GeneXpert MRSA Assay (FDA approved for NASAL specimens only), is one component of a comprehensive MRSA colonization surveillance program. It is not intended to diagnose MRSA infection nor to guide or monitor treatment for MRSA infections. RESULT CALLED TO, READ BACK BY AND VERIFIED WITH: K ABDUSSURAN,RN @0440  11/17/15 MKELLY   Culture, blood (x 2)     Status: Abnormal   Collection Time: 11/17/15  2:35 AM  Result Value Ref Range Status   Specimen Description BLOOD BLOOD LEFT HAND  Final   Special Requests IN PEDIATRIC BOTTLE 1CC  Final   Culture  Setup Time   Final    GRAM POSITIVE COCCI IN CLUSTERS AEROBIC BOTTLE ONLY CRITICAL VALUE NOTED.  VALUE IS CONSISTENT WITH PREVIOUSLY REPORTED AND CALLED VALUE.    Culture (A)  Final    STAPHYLOCOCCUS AUREUS SUSCEPTIBILITIES PERFORMED ON PREVIOUS CULTURE WITHIN THE LAST 5 DAYS.    Report Status 11/20/2015 FINAL  Final  Culture, blood (x 2)     Status: Abnormal   Collection Time: 11/17/15  2:45 AM  Result Value Ref Range Status   Specimen Description BLOOD BLOOD RIGHT HAND  Final   Special Requests IN PEDIATRIC  BOTTLE 2CC  Final   Culture  Setup Time   Final    GRAM POSITIVE COCCI IN CLUSTERS AEROBIC BOTTLE ONLY CRITICAL RESULT CALLED TO, READ BACK BY AND VERIFIED WITH: L POWELL PHARMD 1800 11/17/15 A BROWNING    Culture (A)  Final    STAPHYLOCOCCUS AUREUS SUSCEPTIBILITIES PERFORMED ON PREVIOUS CULTURE WITHIN THE LAST 5 DAYS.    Report Status 11/20/2015 FINAL  Final  Culture, blood (Routine X 2) w Reflex to ID Panel     Status: Abnormal   Collection Time: 11/18/15  7:10 AM  Result Value Ref Range Status   Specimen Description BLOOD RIGHT HAND  Final   Special Requests IN PEDIATRIC BOTTLE 3CC  Final   Culture  Setup Time   Final    GRAM POSITIVE COCCI IN CLUSTERS AEROBIC BOTTLE ONLY CRITICAL RESULT CALLED TO, READ BACK BY AND VERIFIED WITH: G ABBOTT PHARMD 2302 11/18/15 A BROWNING    Culture (A)  Final    STAPHYLOCOCCUS AUREUS SUSCEPTIBILITIES PERFORMED ON PREVIOUS CULTURE WITHIN THE LAST 5 DAYS. ENTEROCOCCUS FAECALIS    Report Status 11/21/2015 FINAL  Final   Organism ID, Bacteria ENTEROCOCCUS FAECALIS  Final      Susceptibility   Enterococcus faecalis - MIC*    AMPICILLIN <=2 SENSITIVE Sensitive     VANCOMYCIN 2 SENSITIVE Sensitive     GENTAMICIN SYNERGY SENSITIVE Sensitive     * ENTEROCOCCUS FAECALIS  Culture, blood (Routine X 2) w Reflex to ID Panel     Status: Abnormal   Collection Time: 11/18/15  8:41 AM  Result Value Ref Range Status   Specimen Description BLOOD LEFT ARM  Final   Special Requests IN PEDIATRIC BOTTLE 3CC  Final   Culture  Setup Time   Final    GRAM POSITIVE COCCI IN CLUSTERS IN PEDIATRIC BOTTLE CRITICAL RESULT CALLED TO, READ BACK BY AND  VERIFIED WITH: Evelena Peat PHARMD 2302 11/18/15 A BROWNING    Culture (A)  Final    STAPHYLOCOCCUS AUREUS SUSCEPTIBILITIES PERFORMED ON PREVIOUS CULTURE WITHIN THE LAST 5 DAYS.    Report Status 11/20/2015 FINAL  Final  Culture, blood (routine x 2)     Status: None (Preliminary result)   Collection Time: 11/19/15  1:25 PM    Result Value Ref Range Status   Specimen Description BLOOD RIGHT HAND  Final   Special Requests IN PEDIATRIC BOTTLE  Final   Culture NO GROWTH < 24 HOURS  Final   Report Status PENDING  Incomplete  Culture, blood (routine x 2)     Status: None (Preliminary result)   Collection Time: 11/19/15  1:28 PM  Result Value Ref Range Status   Specimen Description BLOOD LEFT HAND  Final   Special Requests IN PEDIATRIC BOTTLE  Final   Culture NO GROWTH < 24 HOURS  Final   Report Status PENDING  Incomplete     Scheduled Meds: . ampicillin-sulbactam (UNASYN) IV  3 g Intravenous Q6H  . feeding supplement (NEPRO CARB STEADY)  237 mL Oral BID BM  . folic acid  1 mg Oral Daily  . [START ON 11/22/2015] hydrocortisone sod succinate (SOLU-CORTEF) inj  50 mg Intravenous Q12H  . multivitamin with minerals  1 tablet Oral Daily  . nicotine  14 mg Transdermal Daily  . sodium bicarbonate  650 mg Oral BID  . sodium chloride flush  3 mL Intravenous Q12H  . thiamine  100 mg Oral Daily   Continuous Infusions:   Procedures/Studies: Dg Chest 2 View  Result Date: 11/16/2015 CLINICAL DATA:  Intermittent moderate cough beginning 2 days ago. Shortness breath. Moderate back and rib pain. Next healed EXAM: CHEST  2 VIEW COMPARISON:  Two-view chest x-ray 04/10/2013. FINDINGS: Multi focal right lower lobe airspace disease is present. Additional right upper lobe airspace disease present. There is prominence of the hila bilaterally. Linear densities are noted at the left base. The heart size is normal. The visualized soft tissues and bony thorax are unremarkable. IMPRESSION: 1. Multi focal pneumonia involving the right lower lobe, right upper lobe, and possibly left lower lobe. 2. Moderate hilar prominence likely representing vascular congestion neck and possible reactive adenopathy. 3. Recommend follow-up two-view chest x-ray following appropriate treatment therapy. Electronically Signed   By: Marin Roberts M.D.    On: 11/16/2015 21:32   Ct Chest Wo Contrast  Result Date: 11/18/2015 CLINICAL DATA:  30 year old female with history of pulmonary infiltrates. Pneumonia. History of substance abuse. EXAM: CT CHEST WITHOUT CONTRAST TECHNIQUE: Multidetector CT imaging of the chest was performed following the standard protocol without IV contrast. COMPARISON:  No prior chest CT.  Chest x-ray 11/16/2015. FINDINGS: Cardiovascular: Heart size is normal. There is no significant pericardial fluid, thickening or pericardial calcification. Mediastinum/Nodes: Multiple prominent mediastinal lymph nodes measuring up to 1.5 cm in short axis in the subcarinal nodal station. Please note that accurate exclusion of hilar adenopathy is limited on noncontrast CT scans. Esophagus is unremarkable in appearance. No axillary lymphadenopathy. Lungs/Pleura: Multifocal airspace consolidation is noted throughout the lungs bilaterally, most confluent in the posterior aspect of the right lower lobe. Several of the areas of apparent airspace consolidation are somewhat nodular in appearance, and there is one cavitary area in the right upper lobe (image 80 of series 3) which measures up to 1.7 cm in diameter, in addition to a smaller cavitary area in the periphery of the left lower lobe (  image 89 of series 3). The overall spectrum of findings is highly concerning for multifocal pneumonia, presumably from septic emboli. Given the somewhat wedge-shaped appearance of the largest area of consolidation in the right lower lobe, the possibility of significant ischemia/infarct in the right lower lobe related to a larger pulmonary embolus is considered likely (image 71 of series 3). Trace bilateral pleural effusions lie dependently. Upper Abdomen: Although incompletely visualized, the spleen appears enlarged measuring up to 13.8 x 5.1 cm on axial images. Musculoskeletal: There are no aggressive appearing lytic or blastic lesions noted in the visualized portions of the  skeleton. IMPRESSION: 1. The overall spectrum of findings in the lungs is compatible with multilobar pneumonia, likely from septic embolization. 2. The largest area of airspace consolidation in the right lower lobe has a very wedge-shaped in appearanceand is concerning for potential pulmonary infarct from larger embolus. This could be further evaluated with PE protocol CT scan if clinically appropriate. 3. Trace bilateral pleural effusions lying dependently. 4. Splenomegaly. These results will be called to the ordering clinician or representative by the Radiologist Assistant, and communication documented in the PACS or zVision Dashboard. Electronically Signed   By: Trudie Reedaniel  Entrikin M.D.   On: 11/18/2015 08:28   [place] Renal  Result Date: 11/17/2015 CLINICAL DATA:  Acute kidney injury.  Sepsis. EXAM: RENAL / URINARY TRACT ULTRASOUND COMPLETE COMPARISON:  None. FINDINGS: Right Kidney: Length: 13.8 cm, within normal limits. The kidney is somewhat echogenic. It is isoechoic to the index organ, the liver. No discrete mass lesion or stone is present. There is no hydronephrosis. Left Kidney: Length: 13.9 cm, within normal limits. The kidney is somewhat echogenic. It is isoechoic to the index organ, the spleen. No discrete mass lesion or stone is present. There is no hydronephrosis. Bladder: Appears normal for degree of bladder distention. Incidental note is made of multiple shadowing stones within the gallbladder. There is no significant wall thickening to suggest cholecystitis. IMPRESSION: 1. The kidneys are hyperechoic bilaterally. This is nonspecific, but can be seen in the setting of acute medical renal disease. 2. No renal calculus or mass lesion.  No hydronephrosis. 3. Cholelithiasis without evidence for cholecystitis. Electronically Signed   By: Marin Robertshristopher  Mattern M.D.   On: 11/17/2015 22:27   Nm Pulmonary Perf And Vent  Result Date: 11/17/2015 CLINICAL DATA:  Shortness of breath, hemoptysis, elevated D-dimer,  history asthma EXAM: NUCLEAR MEDICINE BOWEL (MECKEL'S) SCAN TECHNIQUE: Sequential abdominal images were obtained following intravenous injection of radiopharmaceutical. RADIOPHARMACEUTICALS:  mCi Tc-272m pertechnetate IV 30.3 mCi Technetium-2872m DTPA aerosol inhalation and 4.0 mCi Technetium-3572m MAA IV COMPARISON:  None. None Correlation:  Chest radiograph 11/16/2015 FINDINGS: Ventilation: Diminished ventilation at apices. Focal ventilation defect at medial LEFT lower lobe. Additional diminished ventilation in both lower lobes. Perfusion: Small subsegmental perfusion defect lateral RIGHT upper lobe corresponding to focal opacity on chest radiograph. Additional large segmental defect medial RIGHT middle lobe corresponding to infiltrate on the chest radiograph. Additional perfusion defects in BILATERAL lower lobes, though stripe signs are present at both. Chest radiograph demonstrates multifocal multifocal infiltrates bilaterally. Presence of matching ventilation, perfusion, and radiographic abnormalities represents an intermediate probability for pulmonary embolism. IMPRESSION: Matching ventilatory, perfusion and radiographic abnormalities compatible with an intermediate probability for pulmonary embolism. Electronically Signed   By: Ulyses SouthwardMark  Boles M.D.   On: 11/17/2015 12:03   Dg Chest Port 1 View  Result Date: 11/19/2015 CLINICAL DATA:  Shortness of breath and chest pain EXAM: PORTABLE CHEST 1 VIEW COMPARISON:  11/17/2015 FINDINGS: Cardiac shadow  is stable. Persistent right lower lobe infiltrate is noted. Patchy changes are noted bilaterally consistent with that seen on recent CT examination. No acute bony abnormality is noted. IMPRESSION: Patchy changes bilaterally, worst in the right lower lobe consistent with multifocal pneumonia. Electronically Signed   By: Alcide Clever M.D.   On: 11/19/2015 11:21    Audre Cenci, DO  [facility] Pager [phone]  If 7PM-7AM, please contact  night-coverage [email or web address] Password [email or web address] 11/21/2015, 4:00 PM   LOS: 5 days

## 2015-11-21 NOTE — Progress Notes (Signed)
Pharmacy Antibiotic Note  Catherine Hernandez is a 30 y.o. female with MSSA and enterobacter bacteremia on Unasyn. WBC= 9, afebrile, SCr= 1.92 (improving), CrCl ~ 30. Plans for TEE on 8/21.   Plan: Change unasyn to 3gm IV q6h -Will follow renal function, cultures and clinical progress  Height: 5\' 4"  (162.6 cm) Weight: 105 lb 13.1 oz (48 kg) IBW/kg (Calculated) : 54.7  Temp (24hrs), Avg:98.1 F (36.7 C), Min:97.6 F (36.4 C), Max:98.8 F (37.1 C)   Recent Labs Lab 11/16/15 2030 11/16/15 2039 11/16/15 2342 11/17/15 0242  11/17/15 0819 11/17/15 0932  11/17/15 1632 11/17/15 2045 11/18/15 0840 11/18/15 1055 11/19/15 1029 11/20/15 0334 11/21/15 1127  WBC 8.9  --   --   --   --   --  8.3  --   --   --   --  8.9 9.0  --   --   CREATININE 4.43*  --   --   --   < > 4.46* 4.53*  < >  --  4.44* 4.72*  --  3.98* 3.15* 1.92*  LATICACIDVEN  --  2.16* 0.99 1.0  --  0.8  --   --  1.6  --   --   --   --   --   --   < > = values in this interval not displayed.  Estimated Creatinine Clearance: 33.1 mL/min (by C-G formula based on SCr of 1.92 mg/dL).    No Known Allergies  Antimicrobials this admission: Vanc 8/15 >>8/17 Zosyn 8/15 >>8/17 Unasyn 8/17>>  Microbiology results: 8/15 UCx: mult species, suggest recollect 8/15 BCx: MSSA 8/15 [employer] (+)enterococcus sp, Staph sp, MRSA neg 8/16 MRSA PCR(+) 8/16 BCx: Staph aureus  (MSSA) 8/17 BCx: Staph aureus (MSSA), enterococcus f (pan-sens)  Thank you for allowing pharmacy to be a part of this patient's care.  Harland GermanAndrew Carlyn Lemke, Pharm D 11/21/2015 2:26 PM

## 2015-11-22 ENCOUNTER — Inpatient Hospital Stay (HOSPITAL_COMMUNITY): Payer: Self-pay

## 2015-11-22 LAB — BASIC METABOLIC PANEL
Anion gap: 5 (ref 5–15)
BUN: 37 mg/dL — AB (ref 6–20)
CALCIUM: 7.4 mg/dL — AB (ref 8.9–10.3)
CHLORIDE: 115 mmol/L — AB (ref 101–111)
CO2: 19 mmol/L — AB (ref 22–32)
CREATININE: 1.68 mg/dL — AB (ref 0.44–1.00)
GFR calc non Af Amer: 41 mL/min — ABNORMAL LOW (ref >60)
GFR, EST AFRICAN AMERICAN: 47 mL/min — AB (ref >60)
GLUCOSE: 100 mg/dL — AB (ref 65–99)
Potassium: 3.1 mmol/L — ABNORMAL LOW (ref 3.5–5.1)
Sodium: 139 mmol/L (ref 135–145)

## 2015-11-22 LAB — URINALYSIS, ROUTINE W REFLEX MICROSCOPIC
Bilirubin Urine: NEGATIVE
GLUCOSE, UA: 250 mg/dL — AB
Ketones, ur: NEGATIVE mg/dL
LEUKOCYTES UA: NEGATIVE
Nitrite: NEGATIVE
PROTEIN: 100 mg/dL — AB
Specific Gravity, Urine: 1.019 (ref 1.005–1.030)
pH: 6 (ref 5.0–8.0)

## 2015-11-22 LAB — URINE MICROSCOPIC-ADD ON

## 2015-11-22 LAB — CBC
HCT: 22 % — ABNORMAL LOW (ref 36.0–46.0)
Hemoglobin: 6.8 g/dL — CL (ref 12.0–15.0)
MCH: 25.8 pg — AB (ref 26.0–34.0)
MCHC: 30.9 g/dL (ref 30.0–36.0)
MCV: 83.3 fL (ref 78.0–100.0)
PLATELETS: 152 10*3/uL (ref 150–400)
RBC: 2.64 MIL/uL — ABNORMAL LOW (ref 3.87–5.11)
RDW: 17.8 % — AB (ref 11.5–15.5)
WBC: 7.9 10*3/uL (ref 4.0–10.5)

## 2015-11-22 LAB — HEMOGLOBIN AND HEMATOCRIT, BLOOD
HCT: 27.5 % — ABNORMAL LOW (ref 36.0–46.0)
HEMOGLOBIN: 9.1 g/dL — AB (ref 12.0–15.0)

## 2015-11-22 LAB — PREPARE RBC (CROSSMATCH)

## 2015-11-22 LAB — ABO/RH: ABO/RH(D): O POS

## 2015-11-22 MED ORDER — POTASSIUM CHLORIDE CRYS ER 20 MEQ PO TBCR
40.0000 meq | EXTENDED_RELEASE_TABLET | Freq: Once | ORAL | Status: AC
  Administered 2015-11-22: 40 meq via ORAL
  Filled 2015-11-22: qty 2

## 2015-11-22 MED ORDER — SODIUM CHLORIDE 0.9 % IV SOLN
Freq: Once | INTRAVENOUS | Status: AC
  Administered 2015-11-22: 06:00:00 via INTRAVENOUS

## 2015-11-22 MED ORDER — SODIUM CHLORIDE 0.9 % IV SOLN
INTRAVENOUS | Status: DC
  Administered 2015-11-22: 12:00:00 via INTRAVENOUS
  Filled 2015-11-22 (×4): qty 1000

## 2015-11-22 MED ORDER — DEXTROSE 5 % IV SOLN
2.0000 g | Freq: Two times a day (BID) | INTRAVENOUS | Status: DC
  Administered 2015-11-22 – 2015-12-02 (×20): 2 g via INTRAVENOUS
  Filled 2015-11-22 (×21): qty 2

## 2015-11-22 NOTE — Progress Notes (Addendum)
Patient ID: Catherine Hernandez, female   DOB: 05/29/1985, 30 y.o.   MRN: [id number]          [facility]    Date of Admission:  11/16/2015   Total days of antibiotics 6        Day 5 ampicillin sulbactam         Principal Problem:   Endocarditis of native valve Active Problems:   Sepsis (HCC)   CAP (community acquired pneumonia)   Enterococcal bacteremia   Staphylococcus aureus bacteremia   Polysubstance dependence including opioid type drug with complication, continuous use (HCC)   Opioid use with withdrawal (HCC)   Hyponatremia   Acute renal failure (HCC)   Hypocalcemia   Nonspecific abnormal electrocardiogram (ECG) (EKG)   Thrombocytopenia (HCC)   Normocytic anemia   Protein-calorie malnutrition (HCC)   Poor dentition   AKI (acute kidney injury) (HCC)   Malnutrition of moderate degree   Polysubstance abuse   . ampicillin-sulbactam (UNASYN) IV  3 g Intravenous Q6H  . feeding supplement (NEPRO CARB STEADY)  237 mL Oral BID BM  . folic acid  1 mg Oral Daily  . hydrocortisone sod succinate (SOLU-CORTEF) inj  50 mg Intravenous Q12H  . multivitamin with minerals  1 tablet Oral Daily  . nicotine  14 mg Transdermal Daily  . potassium chloride  40 mEq Oral Once  . sodium bicarbonate  650 mg Oral BID  . sodium chloride flush  3 mL Intravenous Q12H  . thiamine  100 mg Oral Daily    SUBJECTIVE: She is feeling better. She is no longer having severe chest pain and shortness of breath. She is not having any dysuria or diarrhea.  Review of Systems: Review of Systems  Constitutional: Positive for fever. Negative for chills, diaphoresis, malaise/fatigue and weight loss.  HENT: Negative for sore throat.   Respiratory: Negative for cough, sputum production and shortness of breath.   Cardiovascular: Negative for chest pain.  Gastrointestinal: Negative for abdominal pain, diarrhea, nausea and vomiting.  Genitourinary: Negative for dysuria.  Musculoskeletal:  Negative for joint pain and myalgias.  Skin: Negative for rash.  Neurological: Negative for headaches.  Psychiatric/Behavioral: Positive for substance abuse.    Past Medical History:  Diagnosis Date  . CAP (community acquired pneumonia) 11/16/2015  . Childhood asthma   . Heroin abuse   . Pneumonia 2007  . Polysubstance abuse     Social History  Substance Use Topics  . Smoking status: Current Every Day Smoker    Packs/day: 0.50    Years: 10.00    Types: Cigarettes  . Smokeless tobacco: Never Used  . Alcohol use No    Family History  Problem Relation Age of Onset  . Anesthesia problems Neg Hx   . Hypotension Neg Hx   . Malignant hyperthermia Neg Hx   . Pseudochol deficiency Neg Hx    No Known Allergies  OBJECTIVE: Vitals:   11/22/15 0600 11/22/15 0648 11/22/15 0823 11/22/15 0845  BP: 134/80  115/63 124/66  Pulse: 97 (!) 108 (!) 121   Resp:   16 (!) 22  Temp:  (!) 101.1 F (38.4 C) 99.6 F (37.6 C) 99.4 F (37.4 C)  TempSrc:  Oral Oral Oral  SpO2: 95% 94% 95%   Weight:      Height:       Body mass index is 19.15 kg/m.  Physical Exam  Constitutional: She is oriented to person, place, and time.  She is alert and appears  comfortable resting in bed.  HENT:  Mouth/Throat: No oropharyngeal exudate.  Cardiovascular: Normal rate and regular rhythm.   No murmur heard. Pulmonary/Chest: Effort normal and breath sounds normal.  Abdominal: Soft. There is no tenderness.  Musculoskeletal: Normal range of motion. She exhibits no edema or tenderness.  Neurological: She is alert and oriented to person, place, and time.  Skin: No rash noted.  Right arm IV site looks okay.  Psychiatric: Mood and affect normal.    Lab Results Lab Results  Component Value Date   WBC 7.9 11/22/2015   HGB 6.8 (LL) 11/22/2015   HCT 22.0 (L) 11/22/2015   MCV 83.3 11/22/2015   PLT 152 11/22/2015    Lab Results  Component Value Date   CREATININE 1.68 (H) 11/22/2015   BUN 37 (H)  11/22/2015   NA 139 11/22/2015   K 3.1 (L) 11/22/2015   CL 115 (H) 11/22/2015   CO2 19 (L) 11/22/2015    Lab Results  Component Value Date   ALT 11 (L) 11/18/2015   AST 23 11/18/2015   ALKPHOS 50 11/18/2015   BILITOT 0.5 11/18/2015     Microbiology: Recent Results (from the past 240 hour(s))  Blood Culture (routine x 2)     Status: Abnormal   Collection Time: 11/16/15  8:30 PM  Result Value Ref Range Status   Specimen Description BLOOD RIGHT ARM  Final   Special Requests BOTTLES DRAWN AEROBIC AND ANAEROBIC 5CC  Final   Culture  Setup Time   Final    GRAM POSITIVE COCCI IN CLUSTERS IN BOTH AEROBIC AND ANAEROBIC BOTTLES CRITICAL RESULT CALLED TO, READ BACK BY AND VERIFIED WITH: L POWELL PHARMD 1800 11/17/15 A BROWNING    Culture (A)  Final    STAPHYLOCOCCUS AUREUS ENTEROCOCCUS FAECALIS SUSCEPTIBILITIES PERFORMED ON PREVIOUS CULTURE WITHIN THE LAST 5 DAYS. Performed at [facility]    Report Status 11/21/2015 FINAL  Final   Organism ID, Bacteria STAPHYLOCOCCUS AUREUS  Final      Susceptibility   Staphylococcus aureus - MIC*    CIPROFLOXACIN <=0.5 SENSITIVE Sensitive     ERYTHROMYCIN <=0.25 SENSITIVE Sensitive     GENTAMICIN <=0.5 SENSITIVE Sensitive     OXACILLIN 0.5 SENSITIVE Sensitive     TETRACYCLINE <=1 SENSITIVE Sensitive     VANCOMYCIN <=0.5 SENSITIVE Sensitive     TRIMETH/SULFA <=10 SENSITIVE Sensitive     CLINDAMYCIN <=0.25 SENSITIVE Sensitive     RIFAMPIN <=0.5 SENSITIVE Sensitive     Inducible Clindamycin NEGATIVE Sensitive     * STAPHYLOCOCCUS AUREUS  Blood Culture ID Panel (Reflexed)     Status: Abnormal   Collection Time: 11/16/15  8:30 PM  Result Value Ref Range Status   Enterococcus species DETECTED (A) NOT DETECTED Final    Comment: CRITICAL RESULT CALLED TO, READ BACK BY AND VERIFIED WITH: L POWELL PHARMD 1800 11/17/15 A BROWNING    Vancomycin resistance NOT DETECTED NOT DETECTED Final   Listeria monocytogenes NOT DETECTED NOT DETECTED Final     Staphylococcus species DETECTED (A) NOT DETECTED Final    Comment: CRITICAL RESULT CALLED TO, READ BACK BY AND VERIFIED WITH: L POWELL PHARMD 1800 11/17/15 A BROWNING    Staphylococcus aureus DETECTED (A) NOT DETECTED Final    Comment: CRITICAL RESULT CALLED TO, READ BACK BY AND VERIFIED WITH: L POWELL PHARMD 1800 11/17/15 A BROWNING    Methicillin resistance NOT DETECTED NOT DETECTED Final   Streptococcus species NOT DETECTED NOT DETECTED Final   Streptococcus agalactiae NOT DETECTED NOT  DETECTED Final   Streptococcus pneumoniae NOT DETECTED NOT DETECTED Final   Streptococcus pyogenes NOT DETECTED NOT DETECTED Final   Acinetobacter baumannii NOT DETECTED NOT DETECTED Final   Enterobacteriaceae species NOT DETECTED NOT DETECTED Final   Enterobacter cloacae complex NOT DETECTED NOT DETECTED Final   Escherichia coli NOT DETECTED NOT DETECTED Final   Klebsiella oxytoca NOT DETECTED NOT DETECTED Final   Klebsiella pneumoniae NOT DETECTED NOT DETECTED Final   Proteus species NOT DETECTED NOT DETECTED Final   Serratia marcescens NOT DETECTED NOT DETECTED Final   Carbapenem resistance NOT DETECTED NOT DETECTED Final   Haemophilus influenzae NOT DETECTED NOT DETECTED Final   Neisseria meningitidis NOT DETECTED NOT DETECTED Final   Pseudomonas aeruginosa NOT DETECTED NOT DETECTED Final   Candida albicans NOT DETECTED NOT DETECTED Final   Candida glabrata NOT DETECTED NOT DETECTED Final   Candida krusei NOT DETECTED NOT DETECTED Final   Candida parapsilosis NOT DETECTED NOT DETECTED Final   Candida tropicalis NOT DETECTED NOT DETECTED Final    Comment: Performed [facility]  Urine culture     Status: Abnormal   Collection Time: 11/16/15  8:50 PM  Result Value Ref Range Status   Specimen Description URINE, RANDOM  Final   Special Requests NONE  Final   Culture MULTIPLE SPECIES PRESENT, SUGGEST RECOLLECTION (A)  Final   Report Status 11/18/2015 FINAL  Final  Blood Culture  (routine x 2)     Status: Abnormal   Collection Time: 11/16/15  9:30 PM  Result Value Ref Range Status   Specimen Description BLOOD LEFT FOREARM  Final   Special Requests BOTTLES DRAWN AEROBIC AND ANAEROBIC 5CC  Final   Culture  Setup Time   Final    GRAM POSITIVE COCCI IN CLUSTERS IN BOTH AEROBIC AND ANAEROBIC BOTTLES CRITICAL RESULT CALLED TO, READ BACK BY AND VERIFIED WITH: L POWELL PHARMD 1800 11/17/15 A BROWNING    Culture (A)  Final    STAPHYLOCOCCUS AUREUS SUSCEPTIBILITIES PERFORMED ON PREVIOUS CULTURE WITHIN THE LAST 5 DAYS. Performed [facility]    Report Status 11/20/2015 FINAL  Final  MRSA PCR Screening     Status: Abnormal   Collection Time: 11/17/15 12:45 AM  Result Value Ref Range Status   MRSA by PCR POSITIVE (A) NEGATIVE Final    Comment:        The GeneXpert MRSA Assay (FDA approved for NASAL specimens only), is one component of a comprehensive MRSA colonization surveillance program. It is not intended to diagnose MRSA infection nor to guide or monitor treatment for MRSA infections. RESULT CALLED TO, READ BACK BY AND VERIFIED WITH: K ABDUSSURAN,RN @0440  11/17/15 MKELLY   Culture, blood (x 2)     Status: Abnormal   Collection Time: 11/17/15  2:35 AM  Result Value Ref Range Status   Specimen Description BLOOD BLOOD LEFT HAND  Final   Special Requests IN PEDIATRIC BOTTLE 1CC  Final   Culture  Setup Time   Final    GRAM POSITIVE COCCI IN CLUSTERS AEROBIC BOTTLE ONLY CRITICAL VALUE NOTED.  VALUE IS CONSISTENT WITH PREVIOUSLY REPORTED AND CALLED VALUE.    Culture (A)  Final    STAPHYLOCOCCUS AUREUS SUSCEPTIBILITIES PERFORMED ON PREVIOUS CULTURE WITHIN THE LAST 5 DAYS.    Report Status 11/20/2015 FINAL  Final  Culture, blood (x 2)     Status: Abnormal   Collection Time: 11/17/15  2:45 AM  Result Value Ref Range Status   Specimen Description BLOOD BLOOD RIGHT  HAND  Final   Special Requests IN PEDIATRIC BOTTLE 2CC  Final   Culture  Setup Time    Final    GRAM POSITIVE COCCI IN CLUSTERS AEROBIC BOTTLE ONLY CRITICAL RESULT CALLED TO, READ BACK BY AND VERIFIED WITH: L POWELL PHARMD 1800 11/17/15 A BROWNING    Culture (A)  Final    STAPHYLOCOCCUS AUREUS SUSCEPTIBILITIES PERFORMED ON PREVIOUS CULTURE WITHIN THE LAST 5 DAYS.    Report Status 11/20/2015 FINAL  Final  Culture, blood (Routine X 2) w Reflex to ID Panel     Status: Abnormal   Collection Time: 11/18/15  7:10 AM  Result Value Ref Range Status   Specimen Description BLOOD RIGHT HAND  Final   Special Requests IN PEDIATRIC BOTTLE 3CC  Final   Culture  Setup Time   Final    GRAM POSITIVE COCCI IN CLUSTERS AEROBIC BOTTLE ONLY CRITICAL RESULT CALLED TO, READ BACK BY AND VERIFIED WITH: G ABBOTT PHARMD 2302 11/18/15 A BROWNING    Culture (A)  Final    STAPHYLOCOCCUS AUREUS SUSCEPTIBILITIES PERFORMED ON PREVIOUS CULTURE WITHIN THE LAST 5 DAYS. ENTEROCOCCUS FAECALIS    Report Status 11/21/2015 FINAL  Final   Organism ID, Bacteria ENTEROCOCCUS FAECALIS  Final      Susceptibility   Enterococcus faecalis - MIC*    AMPICILLIN <=2 SENSITIVE Sensitive     VANCOMYCIN 2 SENSITIVE Sensitive     GENTAMICIN SYNERGY SENSITIVE Sensitive     * ENTEROCOCCUS FAECALIS  Culture, blood (Routine X 2) w Reflex to ID Panel     Status: Abnormal   Collection Time: 11/18/15  8:41 AM  Result Value Ref Range Status   Specimen Description BLOOD LEFT ARM  Final   Special Requests IN PEDIATRIC BOTTLE 3CC  Final   Culture  Setup Time   Final    GRAM POSITIVE COCCI IN CLUSTERS IN PEDIATRIC BOTTLE CRITICAL RESULT CALLED TO, READ BACK BY AND VERIFIED WITH: G ABBOTT PHARMD 2302 11/18/15 A BROWNING    Culture (A)  Final    STAPHYLOCOCCUS AUREUS SUSCEPTIBILITIES PERFORMED ON PREVIOUS CULTURE WITHIN THE LAST 5 DAYS.    Report Status 11/20/2015 FINAL  Final  Culture, blood (routine x 2)     Status: None (Preliminary result)   Collection Time: 11/19/15  1:25 PM  Result Value Ref Range Status   Specimen  Description BLOOD RIGHT HAND  Final   Special Requests IN PEDIATRIC BOTTLE  Final   Culture NO GROWTH 2 DAYS  Final   Report Status PENDING  Incomplete  Culture, blood (routine x 2)     Status: None (Preliminary result)   Collection Time: 11/19/15  1:28 PM  Result Value Ref Range Status   Specimen Description BLOOD LEFT HAND  Final   Special Requests IN PEDIATRIC BOTTLE  Final   Culture NO GROWTH 2 DAYS  Final   Report Status PENDING  Incomplete     ASSESSMENT: She is looking and feeling better and her repeat blood cultures are negative at 48 hours however she had renewed fever up to 102.7 last night. The source of the fever is unclear. I will continue ampicillin sulbactam pending further observation, final culture results and TEE results. Our pharmacist pointed out that enterococcus was isolated a second time on repeat cultures. Therefore I will start double coverage by adding ceftriaxone.  PLAN: 1. Continue ampicillin sulbactam 2. Start ceftriaxone  Cliffton Asters, MD [facility] [phone] pager   [phone]  cell 11/22/2015, 10:20 AM

## 2015-11-22 NOTE — Progress Notes (Signed)
PROGRESS NOTE  Catherine Hernandez RUE:[id number]:[id number] DOB: Apr 03, 1986 DOA: 11/16/2015 PCP: No PCP Per Patient Brief History: 30 year old female with a history of polysubstance abuse (cocaine, heroin, tobacco, marijuana), asthma presented with increasing shortness of breath and coughing. Her symptoms were associated with diffuse myalgias and poor oral intake. Apparently, the patient's last heroin and cocaine use was approximately [facility]weeks priorto admission. Upon presentation. The patient was noted to be febrile with a fever up to 101.73F, hypertension, and tachycardia. Code sepsis was activated, and the patient was started on intravenous fluids and intravenous antibiotics. The patient was found to have MRSA and Enterococcus faecalis bacteremia. ID was consulted to help manage antibiotics. The patient had tricuspid valve vegetation on TTE.  TEE ordered, but this was delayed due to patient request.  Assessment/Plan: Sepsis -due to bacteremia with metastatic infection -patient has pulmonary infiltrates which representsmetastatic infection from bacteremia -Saline lock IV fluids--sepsis physiology resolved -Continue Unasyn -lactic acid 2.16-->0.99 -procalcitonin 18.52 -11/22/15--new fever-->repeat blood culture, UA/culture repeat CXR -restart IVF due to fever due to increased insensible fluid loss -TEE rescheduled for 11/23/15 due to pt request/demand  Tachypnea -CXR--unchanged infiltrates -in part due to metabolic acidosis--start bicarbonate po -98-100% on RA  MSSA & E faecalisBacteremia -abx per ID -requested TEE--scheduled for 11/22/15 -8/15, 8/16, 8/17--blood culture --positive -11/19/15 blood culture--neg @ 48 hours  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%, tricuspid valve vegetations -TEE scheduled for 11/22/2015  Pulmonary infiltrates/Septic emboli -V/Q scan--indeterminate -CT chest without contrast--multifocal nodular consolidations with cavitations likely  septic emboli; RLL consolidation/wedge-shaped infarct -hemoptysis improved  Acute kidney injury -secondary to sepsis and hemodynamic changes -04/09/2013 serum creatinine 1.01 -11/16/2015 serum creatinine 4.43 -Renal ultrasound--bilateral echogenic kidneys; no hydronephrosis -UA with pyuria and proteinuria -check urine protein creatinine ratio--1.24 -improving  Hyponatremia -likely volume depletion -improved with IVF  Thrombocytopenia -due to sepsis -HIV antibody--neg -hep B and C--negative -improving with tx of infection  Hypokalemia -repleted -check mag--2.1  Hypocalcemia -improved -check 25 vitamin D--20.4-->supplement  Hemoptysis -INR 1.45 -PTT 40 -trend Hgb-->drop in Hgb likely dilution--no other obvious source of blood loss -improved  Anemia of chronic disease -drop likely dilution -11/22/15--transfuse 2 units PRBC    Disposition Plan: cancel transfer to tele Family Communication: "girlfriend" updatedat bedside 8/21--Total time spent 35 minutes.  Greater than 50% spent face to face counseling and coordinating care.   Consultants: ID, cardiology for TEE  Code Status: FULL  DVT Prophylaxis: SCDs   Procedures: As Listed in Progress Note Above  Antibiotics: Zosyn 8/15>>8/16 Vanco 8/15>>8/17 Unasyn 11/18/15>>>    Subjective: Patient associated and wants to have TEE rescheduled because of wanting to eat and drink and claiming that she was not informed that she had to be nothing by mouth. She has some shortness of breath, but states that this is not any worse than the last several days. Denies any headache, chest pain, vomiting,. She has had 3 loose stools in the past 24 hours without hematochezia or melena.  Objective: Vitals:   11/22/15 0600 11/22/15 0648 11/22/15 0823 11/22/15 0845  BP: 134/80  115/63 124/66  Pulse: 97 (!) 108 (!) 121   Resp:   16 (!) 22  Temp:  (!) 101.1 F (38.4 C) 99.6 F (37.6 C) 99.4 F (37.4 C)    TempSrc:  Oral Oral Oral  SpO2: 95% 94% 95%   Weight:      Height:        Intake/Output Summary (Last 24 hours) at 11/22/15  1610 Last data filed at 11/22/15 9604  Gross per 24 hour  Intake             1260 ml  Output                0 ml  Net             1260 ml   Weight change:  Exam:   General:  Pt is alert, follows commands appropriately, not in acute distress  HEENT: No icterus, No thrush, No neck mass, [place]/AT  Cardiovascular: RRR, S1/S2, no rubs, no gallops  Respiratory: Bibasilar crackles. No wheezing. Good air movement.  Abdomen: Soft/+BS, mild periumbilical tenderness without any rebound, non distended, no guarding  Extremities: trace LE edema, No lymphangitis, No petechiae, No rashes, no synovitis   Data Reviewed: I have personally reviewed following labs and imaging studies Basic Metabolic Panel:  Recent Labs Lab 11/17/15 0819 11/17/15 0932  11/18/15 0840 11/19/15 1029 11/20/15 0334 11/21/15 1127 11/22/15 0238  NA 122* 121*  < > 128* 134* 135 137 139  K 3.5 3.2*  < > 5.5* 5.0 4.8 4.0 3.1*  CL 93* 92*  < > 105 111 110 114* 115*  CO2 15* 17*  < > 14* 14* 17* 15* 19*  GLUCOSE 93 101*  < > 145* 111* 170* 148* 100*  BUN 58* 59*  < > 68* 71* 63* 46* 37*  CREATININE 4.46* 4.53*  < > 4.72* 3.98* 3.15* 1.92* 1.68*  CALCIUM 6.6* 6.6*  < > 6.8* 7.5* 7.5* 8.0* 7.4*  MG 2.1 2.1  --   --   --  2.3  --   --   < > = values in this interval not displayed. Liver Function Tests:  Recent Labs Lab 11/16/15 2030 11/18/15 0840  AST 33 23  ALT 12* 11*  ALKPHOS 129* 50  BILITOT 0.7 0.5  PROT 6.5 5.1*  ALBUMIN 2.1* 1.4*   No results for input(s): LIPASE, AMYLASE in the last 168 hours. No results for input(s): AMMONIA in the last 168 hours. Coagulation Profile:  Recent Labs Lab 11/17/15 0243  INR 1.45   CBC:  Recent Labs Lab 11/16/15 2030 11/17/15 0932 11/18/15 1055 11/19/15 1029 11/22/15 0238  WBC 8.9 8.3 8.9 9.0 7.9  NEUTROABS 7.3  --   --   --    --   HGB 10.9* 8.0* 8.5* 8.4* 6.8*  HCT 30.9* 24.3* 25.2* 26.4* 22.0*  MCV 75.6* 77.6* 78.3 80.7 83.3  PLT 30* 34* 44* 74* 152   Cardiac Enzymes:  Recent Labs Lab 11/16/15 2030 11/17/15 0243 11/17/15 0819 11/17/15 0932 11/17/15 1536  CKTOTAL 17*  --   --   --   --   TROPONINI 0.04* 0.03* 0.06* <0.03 <0.03   BNP: Invalid input(s): POCBNP CBG: No results for input(s): GLUCAP in the last 168 hours. HbA1C: No results for input(s): HGBA1C in the last 72 hours. Urine analysis:    Component Value Date/Time   COLORURINE YELLOW 11/16/2015 2050   APPEARANCEUR TURBID (A) 11/16/2015 2050   LABSPEC 1.015 11/16/2015 2050   PHURINE 5.0 11/16/2015 2050   GLUCOSEU NEGATIVE 11/16/2015 2050   HGBUR SMALL (A) 11/16/2015 2050   BILIRUBINUR NEGATIVE 11/16/2015 2050   KETONESUR NEGATIVE 11/16/2015 2050   PROTEINUR 100 (A) 11/16/2015 2050   UROBILINOGEN 0.2 01/07/2012 1140   NITRITE NEGATIVE 11/16/2015 2050   LEUKOCYTESUR SMALL (A) 11/16/2015 2050   Sepsis Labs: @LABRCNTIP (procalcitonin:4,lacticidven:4) ) Recent Results (from the past 240 hour(s))  Blood Culture (routine x 2)     Status: Abnormal   Collection Time: 11/16/15  8:30 PM  Result Value Ref Range Status   Specimen Description BLOOD RIGHT ARM  Final   Special Requests BOTTLES DRAWN AEROBIC AND ANAEROBIC 5CC  Final   Culture  Setup Time   Final    GRAM POSITIVE COCCI IN CLUSTERS IN BOTH AEROBIC AND ANAEROBIC BOTTLES CRITICAL RESULT CALLED TO, READ BACK BY AND VERIFIED WITH: L POWELL PHARMD 1800 11/17/15 A BROWNING    Culture (A)  Final    STAPHYLOCOCCUS AUREUS ENTEROCOCCUS FAECALIS SUSCEPTIBILITIES PERFORMED ON PREVIOUS CULTURE WITHIN THE LAST 5 DAYS. Performed at [facility]    Report Status 11/21/2015 FINAL  Final   Organism ID, Bacteria STAPHYLOCOCCUS AUREUS  Final      Susceptibility   Staphylococcus aureus - MIC*    CIPROFLOXACIN <=0.5 SENSITIVE Sensitive     ERYTHROMYCIN <=0.25 SENSITIVE Sensitive      GENTAMICIN <=0.5 SENSITIVE Sensitive     OXACILLIN 0.5 SENSITIVE Sensitive     TETRACYCLINE <=1 SENSITIVE Sensitive     VANCOMYCIN <=0.5 SENSITIVE Sensitive     TRIMETH/SULFA <=10 SENSITIVE Sensitive     CLINDAMYCIN <=0.25 SENSITIVE Sensitive     RIFAMPIN <=0.5 SENSITIVE Sensitive     Inducible Clindamycin NEGATIVE Sensitive     * STAPHYLOCOCCUS AUREUS  Blood Culture ID Panel (Reflexed)     Status: Abnormal   Collection Time: 11/16/15  8:30 PM  Result Value Ref Range Status   Enterococcus species DETECTED (A) NOT DETECTED Final    Comment: CRITICAL RESULT CALLED TO, READ BACK BY AND VERIFIED WITH: L POWELL PHARMD 1800 11/17/15 A BROWNING    Vancomycin resistance NOT DETECTED NOT DETECTED Final   Listeria monocytogenes NOT DETECTED NOT DETECTED Final   Staphylococcus species DETECTED (A) NOT DETECTED Final    Comment: CRITICAL RESULT CALLED TO, READ BACK BY AND VERIFIED WITH: L POWELL PHARMD 1800 11/17/15 A BROWNING    Staphylococcus aureus DETECTED (A) NOT DETECTED Final    Comment: CRITICAL RESULT CALLED TO, READ BACK BY AND VERIFIED WITH: L POWELL PHARMD 1800 11/17/15 A BROWNING    Methicillin resistance NOT DETECTED NOT DETECTED Final   Streptococcus species NOT DETECTED NOT DETECTED Final   Streptococcus agalactiae NOT DETECTED NOT DETECTED Final   Streptococcus pneumoniae NOT DETECTED NOT DETECTED Final   Streptococcus pyogenes NOT DETECTED NOT DETECTED Final   Acinetobacter baumannii NOT DETECTED NOT DETECTED Final   Enterobacteriaceae species NOT DETECTED NOT DETECTED Final   Enterobacter cloacae complex NOT DETECTED NOT DETECTED Final   Escherichia coli NOT DETECTED NOT DETECTED Final   Klebsiella oxytoca NOT DETECTED NOT DETECTED Final   Klebsiella pneumoniae NOT DETECTED NOT DETECTED Final   Proteus species NOT DETECTED NOT DETECTED Final   Serratia marcescens NOT DETECTED NOT DETECTED Final   Carbapenem resistance NOT DETECTED NOT DETECTED Final   Haemophilus  influenzae NOT DETECTED NOT DETECTED Final   Neisseria meningitidis NOT DETECTED NOT DETECTED Final   Pseudomonas aeruginosa NOT DETECTED NOT DETECTED Final   Candida albicans NOT DETECTED NOT DETECTED Final   Candida glabrata NOT DETECTED NOT DETECTED Final   Candida krusei NOT DETECTED NOT DETECTED Final   Candida parapsilosis NOT DETECTED NOT DETECTED Final   Candida tropicalis NOT DETECTED NOT DETECTED Final    Comment: Performed at [facility]  Urine culture     Status: Abnormal   Collection Time: 11/16/15  8:50 PM  Result Value Ref Range  Status   Specimen Description URINE, RANDOM  Final   Special Requests NONE  Final   Culture MULTIPLE SPECIES PRESENT, SUGGEST RECOLLECTION (A)  Final   Report Status 11/18/2015 FINAL  Final  Blood Culture (routine x 2)     Status: Abnormal   Collection Time: 11/16/15  9:30 PM  Result Value Ref Range Status   Specimen Description BLOOD LEFT FOREARM  Final   Special Requests BOTTLES DRAWN AEROBIC AND ANAEROBIC 5CC  Final   Culture  Setup Time   Final    GRAM POSITIVE COCCI IN CLUSTERS IN BOTH AEROBIC AND ANAEROBIC BOTTLES CRITICAL RESULT CALLED TO, READ BACK BY AND VERIFIED WITH: L POWELL PHARMD 1800 11/17/15 A BROWNING    Culture (A)  Final    STAPHYLOCOCCUS AUREUS SUSCEPTIBILITIES PERFORMED ON PREVIOUS CULTURE WITHIN THE LAST 5 DAYS. Performed at [facility]    Report Status 11/20/2015 FINAL  Final  MRSA PCR Screening     Status: Abnormal   Collection Time: 11/17/15 12:45 AM  Result Value Ref Range Status   MRSA by PCR POSITIVE (A) NEGATIVE Final    Comment:        The GeneXpert MRSA Assay (FDA approved for NASAL specimens only), is one component of a comprehensive MRSA colonization surveillance program. It is not intended to diagnose MRSA infection nor to guide or monitor treatment for MRSA infections. RESULT CALLED TO, READ BACK BY AND VERIFIED WITH: K ABDUSSURAN,RN @0440  11/17/15 MKELLY   Culture, blood (x 2)      Status: Abnormal   Collection Time: 11/17/15  2:35 AM  Result Value Ref Range Status   Specimen Description BLOOD BLOOD LEFT HAND  Final   Special Requests IN PEDIATRIC BOTTLE 1CC  Final   Culture  Setup Time   Final    GRAM POSITIVE COCCI IN CLUSTERS AEROBIC BOTTLE ONLY CRITICAL VALUE NOTED.  VALUE IS CONSISTENT WITH PREVIOUSLY REPORTED AND CALLED VALUE.    Culture (A)  Final    STAPHYLOCOCCUS AUREUS SUSCEPTIBILITIES PERFORMED ON PREVIOUS CULTURE WITHIN THE LAST 5 DAYS.    Report Status 11/20/2015 FINAL  Final  Culture, blood (x 2)     Status: Abnormal   Collection Time: 11/17/15  2:45 AM  Result Value Ref Range Status   Specimen Description BLOOD BLOOD RIGHT HAND  Final   Special Requests IN PEDIATRIC BOTTLE 2CC  Final   Culture  Setup Time   Final    GRAM POSITIVE COCCI IN CLUSTERS AEROBIC BOTTLE ONLY CRITICAL RESULT CALLED TO, READ BACK BY AND VERIFIED WITH: L POWELL PHARMD 1800 11/17/15 A BROWNING    Culture (A)  Final    STAPHYLOCOCCUS AUREUS SUSCEPTIBILITIES PERFORMED ON PREVIOUS CULTURE WITHIN THE LAST 5 DAYS.    Report Status 11/20/2015 FINAL  Final  Culture, blood (Routine X 2) w Reflex to ID Panel     Status: Abnormal   Collection Time: 11/18/15  7:10 AM  Result Value Ref Range Status   Specimen Description BLOOD RIGHT HAND  Final   Special Requests IN PEDIATRIC BOTTLE 3CC  Final   Culture  Setup Time   Final    GRAM POSITIVE COCCI IN CLUSTERS AEROBIC BOTTLE ONLY CRITICAL RESULT CALLED TO, READ BACK BY AND VERIFIED WITH: G ABBOTT PHARMD 2302 11/18/15 A BROWNING    Culture (A)  Final    STAPHYLOCOCCUS AUREUS SUSCEPTIBILITIES PERFORMED ON PREVIOUS CULTURE WITHIN THE LAST 5 DAYS. ENTEROCOCCUS FAECALIS    Report Status 11/21/2015 FINAL  Final   Organism ID,  Bacteria ENTEROCOCCUS FAECALIS  Final      Susceptibility   Enterococcus faecalis - MIC*    AMPICILLIN <=2 SENSITIVE Sensitive     VANCOMYCIN 2 SENSITIVE Sensitive     GENTAMICIN SYNERGY SENSITIVE  Sensitive     * ENTEROCOCCUS FAECALIS  Culture, blood (Routine X 2) w Reflex to ID Panel     Status: Abnormal   Collection Time: 11/18/15  8:41 AM  Result Value Ref Range Status   Specimen Description BLOOD LEFT ARM  Final   Special Requests IN PEDIATRIC BOTTLE 3CC  Final   Culture  Setup Time   Final    GRAM POSITIVE COCCI IN CLUSTERS IN PEDIATRIC BOTTLE CRITICAL RESULT CALLED TO, READ BACK BY AND VERIFIED WITH: G ABBOTT PHARMD 2302 11/18/15 A BROWNING    Culture (A)  Final    STAPHYLOCOCCUS AUREUS SUSCEPTIBILITIES PERFORMED ON PREVIOUS CULTURE WITHIN THE LAST 5 DAYS.    Report Status 11/20/2015 FINAL  Final  Culture, blood (routine x 2)     Status: None (Preliminary result)   Collection Time: 11/19/15  1:25 PM  Result Value Ref Range Status   Specimen Description BLOOD RIGHT HAND  Final   Special Requests IN PEDIATRIC BOTTLE 3ML  Final   Culture NO GROWTH 2 DAYS  Final   Report Status PENDING  Incomplete  Culture, blood (routine x 2)     Status: None (Preliminary result)   Collection Time: 11/19/15  1:28 PM  Result Value Ref Range Status   Specimen Description BLOOD LEFT HAND  Final   Special Requests IN PEDIATRIC BOTTLE 3ML  Final   Culture NO GROWTH 2 DAYS  Final   Report Status PENDING  Incomplete     Scheduled Meds: . ampicillin-sulbactam (UNASYN) IV  3 g Intravenous Q6H  . feeding supplement (NEPRO CARB STEADY)  237 mL Oral BID BM  . folic acid  1 mg Oral Daily  . hydrocortisone sod succinate (SOLU-CORTEF) inj  50 mg Intravenous Q12H  . multivitamin with minerals  1 tablet Oral Daily  . nicotine  14 mg Transdermal Daily  . sodium bicarbonate  650 mg Oral BID  . sodium chloride flush  3 mL Intravenous Q12H  . thiamine  100 mg Oral Daily   Continuous Infusions: . sodium chloride 0.9 % 1,000 mL with potassium chloride 20 mEq infusion      Procedures/Studies: Dg Chest 2 View  Result Date: 11/16/2015 CLINICAL DATA:  Intermittent moderate cough beginning 2 days ago.  Shortness breath. Moderate back and rib pain. Next healed EXAM: CHEST  2 VIEW COMPARISON:  Two-view chest x-ray 04/10/2013. FINDINGS: Multi focal right lower lobe airspace disease is present. Additional right upper lobe airspace disease present. There is prominence of the hila bilaterally. Linear densities are noted at the left base. The heart size is normal. The visualized soft tissues and bony thorax are unremarkable. IMPRESSION: 1. Multi focal pneumonia involving the right lower lobe, right upper lobe, and possibly left lower lobe. 2. Moderate hilar prominence likely representing vascular congestion neck and possible reactive adenopathy. 3. Recommend follow-up two-view chest x-ray following appropriate treatment therapy. Electronically Signed   By: Marin Robertshristopher  Mattern M.D.   On: 11/16/2015 21:32   Ct Chest Wo Contrast  Result Date: 11/18/2015 CLINICAL DATA:  30 year old female with history of pulmonary infiltrates. Pneumonia. History of substance abuse. EXAM: CT CHEST WITHOUT CONTRAST TECHNIQUE: Multidetector CT imaging of the chest was performed following the standard protocol without IV contrast. COMPARISON:  No prior chest  CT.  Chest x-ray 11/16/2015. FINDINGS: Cardiovascular: Heart size is normal. There is no significant pericardial fluid, thickening or pericardial calcification. Mediastinum/Nodes: Multiple prominent mediastinal lymph nodes measuring up to 1.5 cm in short axis in the subcarinal nodal station. Please note that accurate exclusion of hilar adenopathy is limited on noncontrast CT scans. Esophagus is unremarkable in appearance. No axillary lymphadenopathy. Lungs/Pleura: Multifocal airspace consolidation is noted throughout the lungs bilaterally, most confluent in the posterior aspect of the right lower lobe. Several of the areas of apparent airspace consolidation are somewhat nodular in appearance, and there is one cavitary area in the right upper lobe (image 80 of series 3) which measures up  to 1.7 cm in diameter, in addition to a smaller cavitary area in the periphery of the left lower lobe (image 89 of series 3). The overall spectrum of findings is highly concerning for multifocal pneumonia, presumably from septic emboli. Given the somewhat wedge-shaped appearance of the largest area of consolidation in the right lower lobe, the possibility of significant ischemia/infarct in the right lower lobe related to a larger pulmonary embolus is considered likely (image 71 of series 3). Trace bilateral pleural effusions lie dependently. Upper Abdomen: Although incompletely visualized, the spleen appears enlarged measuring up to 13.8 x 5.1 cm on axial images. Musculoskeletal: There are no aggressive appearing lytic or blastic lesions noted in the visualized portions of the skeleton. IMPRESSION: 1. The overall spectrum of findings in the lungs is compatible with multilobar pneumonia, likely from septic embolization. 2. The largest area of airspace consolidation in the right lower lobe has a very wedge-shaped in appearanceand is concerning for potential pulmonary infarct from larger embolus. This could be further evaluated with PE protocol CT scan if clinically appropriate. 3. Trace bilateral pleural effusions lying dependently. 4. Splenomegaly. These results will be called to the ordering clinician or representative by the Radiologist Assistant, and communication documented in the PACS or zVision Dashboard. Electronically Signed   By: Trudie Reed M.D.   On: 11/18/2015 08:28   US Renal  Result Date: 11/17/2015 CLINICAL DATA:  Acute kidney injury.  Sepsis. EXAM: RENAL / URINARY TRACT ULTRASOUND COMPLETE COMPARISON:  None. FINDINGS: Right Kidney: Length: 13.8 cm, within normal limits. The kidney is somewhat echogenic. It is isoechoic to the index organ, the liver. No discrete mass lesion or stone is present. There is no hydronephrosis. Left Kidney: Length: 13.9 cm, within normal limits. The kidney is  somewhat echogenic. It is isoechoic to the index organ, the spleen. No discrete mass lesion or stone is present. There is no hydronephrosis. Bladder: Appears normal for degree of bladder distention. Incidental note is made of multiple shadowing stones within the gallbladder. There is no significant wall thickening to suggest cholecystitis. IMPRESSION: 1. The kidneys are hyperechoic bilaterally. This is nonspecific, but can be seen in the setting of acute medical renal disease. 2. No renal calculus or mass lesion.  No hydronephrosis. 3. Cholelithiasis without evidence for cholecystitis. Electronically Signed   By: Marin Roberts M.D.   On: 11/17/2015 22:27   Nm Pulmonary Perf And Vent  Result Date: 11/17/2015 CLINICAL DATA:  Shortness of breath, hemoptysis, elevated D-dimer, history asthma EXAM: NUCLEAR MEDICINE BOWEL (MECKEL'S) SCAN TECHNIQUE: Sequential abdominal images were obtained following intravenous injection of radiopharmaceutical. RADIOPHARMACEUTICALS:  mCi Tc-11m pertechnetate IV 30.3 mCi Technetium-26m DTPA aerosol inhalation and 4.0 mCi Technetium-36m MAA IV COMPARISON:  None. None Correlation:  Chest radiograph 11/16/2015 FINDINGS: Ventilation: Diminished ventilation at apices. Focal ventilation defect at medial LEFT lower  lobe. Additional diminished ventilation in both lower lobes. Perfusion: Small subsegmental perfusion defect lateral RIGHT upper lobe corresponding to focal opacity on chest radiograph. Additional large segmental defect medial RIGHT middle lobe corresponding to infiltrate on the chest radiograph. Additional perfusion defects in BILATERAL lower lobes, though stripe signs are present at both. Chest radiograph demonstrates multifocal multifocal infiltrates bilaterally. Presence of matching ventilation, perfusion, and radiographic abnormalities represents an intermediate probability for pulmonary embolism. IMPRESSION: Matching ventilatory, perfusion and radiographic abnormalities  compatible with an intermediate probability for pulmonary embolism. Electronically Signed   By: Ulyses Southward M.D.   On: 11/17/2015 12:03   Dg Chest Port 1 View  Result Date: 11/19/2015 CLINICAL DATA:  Shortness of breath and chest pain EXAM: PORTABLE CHEST 1 VIEW COMPARISON:  11/17/2015 FINDINGS: Cardiac shadow is stable. Persistent right lower lobe infiltrate is noted. Patchy changes are noted bilaterally consistent with that seen on recent CT examination. No acute bony abnormality is noted. IMPRESSION: Patchy changes bilaterally, worst in the right lower lobe consistent with multifocal pneumonia. Electronically Signed   By: Alcide Clever M.D.   On: 11/19/2015 11:21    Harrell Niehoff, DO  [facility] Pager [phone]  If 7PM-7AM, please contact night-coverage [email or web address] Password [email or web address] 11/22/2015, 9:08 AM   LOS: 6 days

## 2015-11-22 NOTE — Clinical Social Work Note (Signed)
Clinical Social Work Assessment  Patient Details  Name: Catherine Hernandez MRN: [id number] Date of Birth: 1985-09-17  Date of referral:  11/22/15               Reason for consult:  Family Concerns, Substance Use/ETOH Abuse                Permission sought to share information with:    Permission granted to share information::  No  Name::        Agency::     Relationship::     Contact Information:     Housing/Transportation Living arrangements for the past 2 months:  Apartment Source of Information:  Patient Patient Interpreter Needed:  None Criminal Activity/Legal Involvement Pertinent to Current Situation/Hospitalization:  No - Comment as needed Significant Relationships:  Significant Other, Dependent Children Lives with:  Self Do you feel safe going back to the place where you live?  Yes Need for family participation in patient care:  No (Coment)  Care giving concerns:  none   Facilities manager / plan:  CSW met with pt to discuss potential social needs as well as substance abuse.  CSW called today by RN and informed that pt was requesting CSW involvement to help her see her son.  When CSW met with pt she states that her son's father took her son 1.5 years ago and never gave her back- states she consulted a lawyer once and was told to take legal action for custody agreement but pt did not follow up at that time- no legal agreement in place at this time for custody of the pt's son.  CSW explained that I could not assist with any kind of legal set up and could not enforce that her ex bring her son to hospital.  CSW informed pt that we could attempt to call her ex to request he bring son but could not reach beyond that- pt states that she is hopeful they will come tonight but will follow up with CSW if she wants further involvement.  CSW also spoke with pt concerning substance abuse- pt confirms that she has been using opiates and cocaine prior to admission.   States that opiates  have been over past several years but has starting using cocaine over the past couple of months.  Pt reports that she has not participated in rehab program before or tried to quit cocaine/opiates on her own.  Pt maintains that she has a great deal of will power and was motivated enough by health problems to quit drinking in the past without rehab stay.  Pt is interested in rehab after this admission and accepting of resources for residential rehab as well as methadone/suboxone clinics though pt hopeful to quit without aid of methadone/suboxone.  Employment status:    Insurance information:  Self Pay (Medicaid Pending) PT Recommendations:  Not assessed at this time Information / Referral to community resources:  Residential Substance Abuse Treatment Options, Outpatient Substance Abuse Treatment Options  Patient/Family's Response to care:  Pt was very accepting of rehab options and believes strongly she will be able to quit drug abuse after this stent in the hospital.  Patient/Family's Understanding of and Emotional Response to Diagnosis, Current Treatment, and Prognosis: Pt motivated to quit in order to move forward with custody for her son and to prevent future health issues.  Emotional Assessment Appearance:  Appears stated age, Disheveled Attitude/Demeanor/Rapport:    Affect (typically observed):  Accepting, Appropriate Orientation:  Oriented to Self,  Oriented to Place, Oriented to  Time, Oriented to Situation Alcohol / Substance use:  Illicit Drugs Psych involvement (Current and /or in the community):  No (Comment)  Discharge Needs  Concerns to be addressed:  Substance Abuse Concerns Readmission within the last 30 days:  No Current discharge risk:  Substance Abuse, Chronically ill Barriers to Discharge:  Continued Medical Work up   Jorge Ny, LCSW 11/22/2015, 5:13 PM

## 2015-11-23 ENCOUNTER — Inpatient Hospital Stay (HOSPITAL_COMMUNITY): Payer: Self-pay | Admitting: Anesthesiology

## 2015-11-23 ENCOUNTER — Encounter (HOSPITAL_COMMUNITY)
Admission: EM | Disposition: A | Discharge status: Skilled Nursing Facility | Payer: Self-pay | Source: Home / Self Care | Attending: Internal Medicine

## 2015-11-23 ENCOUNTER — Encounter (HOSPITAL_COMMUNITY): Payer: Self-pay

## 2015-11-23 ENCOUNTER — Inpatient Hospital Stay (HOSPITAL_COMMUNITY): Payer: Self-pay

## 2015-11-23 DIAGNOSIS — I340 Nonrheumatic mitral (valve) insufficiency: Secondary | ICD-10-CM

## 2015-11-23 DIAGNOSIS — I361 Nonrheumatic tricuspid (valve) insufficiency: Secondary | ICD-10-CM

## 2015-11-23 HISTORY — PX: TEE WITHOUT CARDIOVERSION: SHX5443

## 2015-11-23 LAB — TYPE AND SCREEN
ABO/RH(D): O POS
Antibody Screen: NEGATIVE
UNIT DIVISION: 0
Unit division: 0

## 2015-11-23 LAB — CBC
HEMATOCRIT: 27.1 % — AB (ref 36.0–46.0)
HEMOGLOBIN: 8.9 g/dL — AB (ref 12.0–15.0)
MCH: 27.4 pg (ref 26.0–34.0)
MCHC: 32.8 g/dL (ref 30.0–36.0)
MCV: 83.4 fL (ref 78.0–100.0)
Platelets: 152 10*3/uL (ref 150–400)
RBC: 3.25 MIL/uL — ABNORMAL LOW (ref 3.87–5.11)
RDW: 16.3 % — ABNORMAL HIGH (ref 11.5–15.5)
WBC: 11 10*3/uL — ABNORMAL HIGH (ref 4.0–10.5)

## 2015-11-23 LAB — BASIC METABOLIC PANEL
ANION GAP: 7 (ref 5–15)
BUN: 21 mg/dL — ABNORMAL HIGH (ref 6–20)
CHLORIDE: 113 mmol/L — AB (ref 101–111)
CO2: 19 mmol/L — AB (ref 22–32)
Calcium: 7 mg/dL — ABNORMAL LOW (ref 8.9–10.3)
Creatinine, Ser: 1.23 mg/dL — ABNORMAL HIGH (ref 0.44–1.00)
GFR calc Af Amer: >60 mL/min (ref >60)
GFR, EST NON AFRICAN AMERICAN: 59 mL/min — AB (ref >60)
GLUCOSE: 139 mg/dL — AB (ref 65–99)
POTASSIUM: 3.3 mmol/L — AB (ref 3.5–5.1)
Sodium: 139 mmol/L (ref 135–145)

## 2015-11-23 LAB — MAGNESIUM: Magnesium: 1.3 mg/dL — ABNORMAL LOW (ref 1.7–2.4)

## 2015-11-23 SURGERY — ECHOCARDIOGRAM, TRANSESOPHAGEAL
Anesthesia: Monitor Anesthesia Care

## 2015-11-23 MED ORDER — BUTAMBEN-TETRACAINE-BENZOCAINE 2-2-14 % EX AERO
INHALATION_SPRAY | CUTANEOUS | Status: DC | PRN
  Administered 2015-11-23: 2 via TOPICAL

## 2015-11-23 MED ORDER — FUROSEMIDE 10 MG/ML IJ SOLN
40.0000 mg | Freq: Once | INTRAMUSCULAR | Status: AC
Start: 2015-11-23 — End: 2015-11-23
  Administered 2015-11-23: 40 mg via INTRAVENOUS
  Filled 2015-11-23: qty 4

## 2015-11-23 MED ORDER — PROPOFOL 500 MG/50ML IV EMUL
INTRAVENOUS | Status: DC | PRN
  Administered 2015-11-23: 225 ug/kg/min via INTRAVENOUS

## 2015-11-23 MED ORDER — PROPOFOL 10 MG/ML IV BOLUS
INTRAVENOUS | Status: DC | PRN
  Administered 2015-11-23 (×3): 50 mg via INTRAVENOUS

## 2015-11-23 MED ORDER — SODIUM CHLORIDE 0.9 % IV SOLN
INTRAVENOUS | Status: DC | PRN
  Administered 2015-11-23: 11:00:00 via INTRAVENOUS

## 2015-11-23 MED ORDER — PREDNISONE 20 MG PO TABS
40.0000 mg | ORAL_TABLET | Freq: Every day | ORAL | Status: DC
  Administered 2015-11-24 – 2015-11-28 (×5): 40 mg via ORAL
  Filled 2015-11-23 (×7): qty 2

## 2015-11-23 MED ORDER — PREDNISONE 50 MG PO TABS: 50.0000 mg | ORAL_TABLET | Freq: Every day | ORAL | Status: DC

## 2015-11-23 MED ORDER — POTASSIUM CHLORIDE CRYS ER 20 MEQ PO TBCR
40.0000 meq | EXTENDED_RELEASE_TABLET | Freq: Once | ORAL | Status: AC
  Administered 2015-11-23: 40 meq via ORAL
  Filled 2015-11-23: qty 2

## 2015-11-23 MED ORDER — SODIUM CHLORIDE 0.9 % IV SOLN
INTRAVENOUS | Status: DC
Start: 2015-11-23 — End: 2015-11-23

## 2015-11-23 MED ORDER — PROMETHAZINE HCL 25 MG/ML IJ SOLN: 6.2500 mg | INTRAMUSCULAR | Status: DC | PRN

## 2015-11-23 MED ORDER — POTASSIUM CHLORIDE IN NACL 20-0.9 MEQ/L-% IV SOLN
INTRAVENOUS | Status: DC
  Administered 2015-11-23: 05:00:00 via INTRAVENOUS

## 2015-11-23 MED ORDER — FENTANYL CITRATE (PF) 100 MCG/2ML IJ SOLN: 25.0000 ug | INTRAMUSCULAR | Status: DC | PRN

## 2015-11-23 NOTE — Progress Notes (Signed)
PROGRESS NOTE  Catherine Hernandez WUJ:[id number] DOB: 1986-01-24 DOA: 11/16/2015 PCP: No PCP Per Patient   Brief History: 30 year old female with a history of polysubstance abuse (cocaine, heroin, tobacco, marijuana), asthma presented with increasing shortness of breath and coughing. Her symptoms were associated with diffuse myalgias and poor oral intake. Apparently, the patient's last heroin and cocaine use was approximately [facility] priorto admission. Upon presentation. The patient was noted to be febrile with a fever up to 101.16F, hypertension, and tachycardia. Code sepsis was activated, and the patient was started on intravenous fluids and intravenous antibiotics.The patient was found to have MRSA and Enterococcus faecalis bacteremia. ID was consulted to help manage antibiotics. The patient had tricuspid valve vegetation on TTE. TEE done on 11/23/15--see below.  Assessment/Plan: Sepsis -due to bacteremia with metastatic infection -patient has pulmonary infiltrates which representsmetastatic infection from bacteremia -Saline lockIV fluids--sepsis physiology resolved -Continue Unasyn -lactic acid 2.16-->0.99 -procalcitonin 18.52 -11/22/15--new fever-->repeat blood culture, UA--neg to date 11/22/15-repeat CXR--interstitial prominence-->lasix IV x 1 -11/23/15 TEE--2 large shaggy and very mobile vegetations on both leaflets of the TV -wean stress steroids to prednisone  Tachypnea -11/23/15 CXR--interstitial prominence-->lasix IV x 1 -in part due to metabolic acidosis--started bicarbonate po -98-100% on RA  MSSA & E faecalisBacteremia -abx per ID -requested TEE--scheduled for 11/22/15 -8/15, 8/16, 8/17--blood culture --positive -11/19/15 blood culture--neg @ 96 hours -PICC line when ok with ID  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%, tricuspid valve vegetations --11/23/15 TEE--2 large shaggy and very mobile vegetations on both leaflets of the  TV  Pulmonary infiltrates/Septic emboli -V/Q scan--indeterminate -CT chest without contrast--multifocal nodular consolidations with cavitations likely septic emboli; RLL consolidation/wedge-shaped infarct -hemoptysis improved  Acute kidney injury -secondary to sepsis and hemodynamic changes -04/09/2013 serum creatinine 1.01 -11/16/2015 serum creatinine 4.43 -Renal ultrasound--bilateral echogenic kidneys; no hydronephrosis -UA with pyuria and proteinuria -check urine protein creatinine ratio--1.24 -improving  Hyponatremia -likely volume depletion -improved with IVF  Thrombocytopenia -due to sepsis -HIV antibody--neg -hep B and C--negative -improving with tx of infection  Hypokalemia -repleted -check mag--2.1  Hypocalcemia -improved -check 25 vitamin D--20.4-->supplement  Hemoptysis -INR 1.45 -PTT 40 -trend Hgb-->drop in Hgb likely dilution--no other obvious source of blood loss -improved  Anemia of chronic disease -drop likely dilution -11/22/15--transfused 2 units PRBC    Disposition Plan:  Transfer to tele Family Communication: no family at bedside  Consultants: ID, cardiology for TEE  Code Status: FULL  DVT Prophylaxis: SCDs   Procedures: As Listed in Progress Note Above  Antibiotics: Zosyn 8/15>>8/16 Vanco 8/15>>8/17 Unasyn 11/18/15>>> Ceftriaxone 11/22/15>>>   Subjective: Patient complains of some dyspnea without any fevers, chills, chest pain, vomiting, hematuria. No hemoptysis. Denies any headache, neck pain.  Objective: Vitals:   11/23/15 1155 11/23/15 1205 11/23/15 1215 11/23/15 1621  BP: 117/65 120/66 116/72 113/74  Pulse: (!) 57 (!) 49 (!) 47   Resp: 11 (!) 31 (!) 32   Temp:    98.3 F (36.8 C)  TempSrc:    Oral  SpO2: 97% 99% 100%   Weight:      Height:        Intake/Output Summary (Last 24 hours) at 11/23/15 1819 Last data filed at 11/23/15 1146  Gross per 24 hour  Intake          2034.58 ml  Output              1353 ml  Net  681.58 ml   Weight change: 0.4 kg (14.1 oz) Exam:   General:  Pt is alert, follows commands appropriately, not in acute distress  HEENT: No icterus, No thrush, No neck mass, [place]/AT  Cardiovascular: RRR, S1/S2, no rubs, no gallops  Respiratory: Bilateral crackles. No wheezing. Good air movement.  Abdomen: Soft/+BS, non tender, non distended, no guarding  Extremities: 1 + LE edema, No lymphangitis, No petechiae, No rashes, no synovitis   Data Reviewed: I have personally reviewed following labs and imaging studies Basic Metabolic Panel:  Recent Labs Lab 11/17/15 0819 11/17/15 0932  11/19/15 1029 11/20/15 0334 11/21/15 1127 11/22/15 0238 11/23/15 0257  NA 122* 121*  < > 134* 135 137 139 139  K 3.5 3.2*  < > 5.0 4.8 4.0 3.1* 3.3*  CL 93* 92*  < > 111 110 114* 115* 113*  CO2 15* 17*  < > 14* 17* 15* 19* 19*  GLUCOSE 93 101*  < > 111* 170* 148* 100* 139*  BUN 58* 59*  < > 71* 63* 46* 37* 21*  CREATININE 4.46* 4.53*  < > 3.98* 3.15* 1.92* 1.68* 1.23*  CALCIUM 6.6* 6.6*  < > 7.5* 7.5* 8.0* 7.4* 7.0*  MG 2.1 2.1  --   --  2.3  --   --  1.3*  < > = values in this interval not displayed. Liver Function Tests:  Recent Labs Lab 11/16/15 2030 11/18/15 0840  AST 33 23  ALT 12* 11*  ALKPHOS 129* 50  BILITOT 0.7 0.5  PROT 6.5 5.1*  ALBUMIN 2.1* 1.4*   No results for input(s): LIPASE, AMYLASE in the last 168 hours. No results for input(s): AMMONIA in the last 168 hours. Coagulation Profile:  Recent Labs Lab 11/17/15 0243  INR 1.45   CBC:  Recent Labs Lab 11/16/15 2030 11/17/15 0932 11/18/15 1055 11/19/15 1029 11/22/15 0238 11/22/15 2238 11/23/15 0257  WBC 8.9 8.3 8.9 9.0 7.9  --  11.0*  NEUTROABS 7.3  --   --   --   --   --   --   HGB 10.9* 8.0* 8.5* 8.4* 6.8* 9.1* 8.9*  HCT 30.9* 24.3* 25.2* 26.4* 22.0* 27.5* 27.1*  MCV 75.6* 77.6* 78.3 80.7 83.3  --  83.4  PLT 30* 34* 44* 74* 152  --  152   Cardiac Enzymes:  Recent  Labs Lab 11/16/15 2030 11/17/15 0243 11/17/15 0819 11/17/15 0932 11/17/15 1536  CKTOTAL 17*  --   --   --   --   TROPONINI 0.04* 0.03* 0.06* <0.03 <0.03   BNP: Invalid input(s): POCBNP CBG: No results for input(s): GLUCAP in the last 168 hours. HbA1C: No results for input(s): HGBA1C in the last 72 hours. Urine analysis:    Component Value Date/Time   COLORURINE YELLOW 11/22/2015 2141   APPEARANCEUR HAZY (A) 11/22/2015 2141   LABSPEC 1.019 11/22/2015 2141   PHURINE 6.0 11/22/2015 2141   GLUCOSEU 250 (A) 11/22/2015 2141   HGBUR MODERATE (A) 11/22/2015 2141   BILIRUBINUR NEGATIVE 11/22/2015 2141   KETONESUR NEGATIVE 11/22/2015 2141   PROTEINUR 100 (A) 11/22/2015 2141   UROBILINOGEN 0.2 01/07/2012 1140   NITRITE NEGATIVE 11/22/2015 2141   LEUKOCYTESUR NEGATIVE 11/22/2015 2141   Sepsis Labs: @LABRCNTIP (procalcitonin:4,lacticidven:4) ) Recent Results (from the past 240 hour(s))  Blood Culture (routine x 2)     Status: Abnormal   Collection Time: 11/16/15  8:30 PM  Result Value Ref Range Status   Specimen Description BLOOD RIGHT ARM  Final   Special  Requests BOTTLES DRAWN AEROBIC AND ANAEROBIC 5CC  Final   Culture  Setup Time   Final    GRAM POSITIVE COCCI IN CLUSTERS IN BOTH AEROBIC AND ANAEROBIC BOTTLES CRITICAL RESULT CALLED TO, READ BACK BY AND VERIFIED WITH: L POWELL PHARMD 1800 11/17/15 A BROWNING    Culture (A)  Final    STAPHYLOCOCCUS AUREUS ENTEROCOCCUS FAECALIS SUSCEPTIBILITIES PERFORMED ON PREVIOUS CULTURE WITHIN THE LAST 5 DAYS. Performed [facility]    Report Status 11/21/2015 FINAL  Final   Organism ID, Bacteria STAPHYLOCOCCUS AUREUS  Final      Susceptibility   Staphylococcus aureus - MIC*    CIPROFLOXACIN <=0.5 SENSITIVE Sensitive     ERYTHROMYCIN <=0.25 SENSITIVE Sensitive     GENTAMICIN <=0.5 SENSITIVE Sensitive     OXACILLIN 0.5 SENSITIVE Sensitive     TETRACYCLINE <=1 SENSITIVE Sensitive     VANCOMYCIN <=0.5 SENSITIVE Sensitive      TRIMETH/SULFA <=10 SENSITIVE Sensitive     CLINDAMYCIN <=0.25 SENSITIVE Sensitive     RIFAMPIN <=0.5 SENSITIVE Sensitive     Inducible Clindamycin NEGATIVE Sensitive     * STAPHYLOCOCCUS AUREUS  Blood Culture ID Panel (Reflexed)     Status: Abnormal   Collection Time: 11/16/15  8:30 PM  Result Value Ref Range Status   Enterococcus species DETECTED (A) NOT DETECTED Final    Comment: CRITICAL RESULT CALLED TO, READ BACK BY AND VERIFIED WITH: L POWELL PHARMD 1800 11/17/15 A BROWNING    Vancomycin resistance NOT DETECTED NOT DETECTED Final   Listeria monocytogenes NOT DETECTED NOT DETECTED Final   Staphylococcus species DETECTED (A) NOT DETECTED Final    Comment: CRITICAL RESULT CALLED TO, READ BACK BY AND VERIFIED WITH: L POWELL PHARMD 1800 11/17/15 A BROWNING    Staphylococcus aureus DETECTED (A) NOT DETECTED Final    Comment: CRITICAL RESULT CALLED TO, READ BACK BY AND VERIFIED WITH: L POWELL PHARMD 1800 11/17/15 A BROWNING    Methicillin resistance NOT DETECTED NOT DETECTED Final   Streptococcus species NOT DETECTED NOT DETECTED Final   Streptococcus agalactiae NOT DETECTED NOT DETECTED Final   Streptococcus pneumoniae NOT DETECTED NOT DETECTED Final   Streptococcus pyogenes NOT DETECTED NOT DETECTED Final   Acinetobacter baumannii NOT DETECTED NOT DETECTED Final   Enterobacteriaceae species NOT DETECTED NOT DETECTED Final   Enterobacter cloacae complex NOT DETECTED NOT DETECTED Final   Escherichia coli NOT DETECTED NOT DETECTED Final   Klebsiella oxytoca NOT DETECTED NOT DETECTED Final   Klebsiella pneumoniae NOT DETECTED NOT DETECTED Final   Proteus species NOT DETECTED NOT DETECTED Final   Serratia marcescens NOT DETECTED NOT DETECTED Final   Carbapenem resistance NOT DETECTED NOT DETECTED Final   Haemophilus influenzae NOT DETECTED NOT DETECTED Final   Neisseria meningitidis NOT DETECTED NOT DETECTED Final   Pseudomonas aeruginosa NOT DETECTED NOT DETECTED Final   Candida  albicans NOT DETECTED NOT DETECTED Final   Candida glabrata NOT DETECTED NOT DETECTED Final   Candida krusei NOT DETECTED NOT DETECTED Final   Candida parapsilosis NOT DETECTED NOT DETECTED Final   Candida tropicalis NOT DETECTED NOT DETECTED Final    Comment: Performed at [facility]  Urine culture     Status: Abnormal   Collection Time: 11/16/15  8:50 PM  Result Value Ref Range Status   Specimen Description URINE, RANDOM  Final   Special Requests NONE  Final   Culture MULTIPLE SPECIES PRESENT, SUGGEST RECOLLECTION (A)  Final   Report Status 11/18/2015 FINAL  Final  Blood  Culture (routine x 2)     Status: Abnormal   Collection Time: 11/16/15  9:30 PM  Result Value Ref Range Status   Specimen Description BLOOD LEFT FOREARM  Final   Special Requests BOTTLES DRAWN AEROBIC AND ANAEROBIC 5CC  Final   Culture  Setup Time   Final    GRAM POSITIVE COCCI IN CLUSTERS IN BOTH AEROBIC AND ANAEROBIC BOTTLES CRITICAL RESULT CALLED TO, READ BACK BY AND VERIFIED WITH: L POWELL PHARMD 1800 11/17/15 A BROWNING    Culture (A)  Final    STAPHYLOCOCCUS AUREUS SUSCEPTIBILITIES PERFORMED ON PREVIOUS CULTURE WITHIN THE LAST 5 DAYS. Performed at [facility]    Report Status 11/20/2015 FINAL  Final  MRSA PCR Screening     Status: Abnormal   Collection Time: 11/17/15 12:45 AM  Result Value Ref Range Status   MRSA by PCR POSITIVE (A) NEGATIVE Final    Comment:        The GeneXpert MRSA Assay (FDA approved for NASAL specimens only), is one component of a comprehensive MRSA colonization surveillance program. It is not intended to diagnose MRSA infection nor to guide or monitor treatment for MRSA infections. RESULT CALLED TO, READ BACK BY AND VERIFIED WITH: K ABDUSSURAN,RN @0440  11/17/15 MKELLY   Culture, blood (x 2)     Status: Abnormal   Collection Time: 11/17/15  2:35 AM  Result Value Ref Range Status   Specimen Description BLOOD BLOOD LEFT HAND  Final   Special Requests IN  PEDIATRIC BOTTLE 1CC  Final   Culture  Setup Time   Final    GRAM POSITIVE COCCI IN CLUSTERS AEROBIC BOTTLE ONLY CRITICAL VALUE NOTED.  VALUE IS CONSISTENT WITH PREVIOUSLY REPORTED AND CALLED VALUE.    Culture (A)  Final    STAPHYLOCOCCUS AUREUS SUSCEPTIBILITIES PERFORMED ON PREVIOUS CULTURE WITHIN THE LAST 5 DAYS.    Report Status 11/20/2015 FINAL  Final  Culture, blood (x 2)     Status: Abnormal   Collection Time: 11/17/15  2:45 AM  Result Value Ref Range Status   Specimen Description BLOOD BLOOD RIGHT HAND  Final   Special Requests IN PEDIATRIC BOTTLE 2CC  Final   Culture  Setup Time   Final    GRAM POSITIVE COCCI IN CLUSTERS AEROBIC BOTTLE ONLY CRITICAL RESULT CALLED TO, READ BACK BY AND VERIFIED WITH: L POWELL PHARMD 1800 11/17/15 A BROWNING    Culture (A)  Final    STAPHYLOCOCCUS AUREUS SUSCEPTIBILITIES PERFORMED ON PREVIOUS CULTURE WITHIN THE LAST 5 DAYS.    Report Status 11/20/2015 FINAL  Final  Culture, blood (Routine X 2) w Reflex to ID Panel     Status: Abnormal   Collection Time: 11/18/15  7:10 AM  Result Value Ref Range Status   Specimen Description BLOOD RIGHT HAND  Final   Special Requests IN PEDIATRIC BOTTLE 3CC  Final   Culture  Setup Time   Final    GRAM POSITIVE COCCI IN CLUSTERS AEROBIC BOTTLE ONLY CRITICAL RESULT CALLED TO, READ BACK BY AND VERIFIED WITH: G ABBOTT PHARMD 2302 11/18/15 A BROWNING    Culture (A)  Final    STAPHYLOCOCCUS AUREUS SUSCEPTIBILITIES PERFORMED ON PREVIOUS CULTURE WITHIN THE LAST 5 DAYS. ENTEROCOCCUS FAECALIS    Report Status 11/21/2015 FINAL  Final   Organism ID, Bacteria ENTEROCOCCUS FAECALIS  Final      Susceptibility   Enterococcus faecalis - MIC*    AMPICILLIN <=2 SENSITIVE Sensitive     VANCOMYCIN 2 SENSITIVE Sensitive     GENTAMICIN  SYNERGY SENSITIVE Sensitive     * ENTEROCOCCUS FAECALIS  Culture, blood (Routine X 2) w Reflex to ID Panel     Status: Abnormal   Collection Time: 11/18/15  8:41 AM  Result Value Ref  Range Status   Specimen Description BLOOD LEFT ARM  Final   Special Requests IN PEDIATRIC BOTTLE 3CC  Final   Culture  Setup Time   Final    GRAM POSITIVE COCCI IN CLUSTERS IN PEDIATRIC BOTTLE CRITICAL RESULT CALLED TO, READ BACK BY AND VERIFIED WITH: G ABBOTT PHARMD 2302 11/18/15 A BROWNING    Culture (A)  Final    STAPHYLOCOCCUS AUREUS SUSCEPTIBILITIES PERFORMED ON PREVIOUS CULTURE WITHIN THE LAST 5 DAYS.    Report Status 11/20/2015 FINAL  Final  Culture, blood (routine x 2)     Status: None (Preliminary result)   Collection Time: 11/19/15  1:25 PM  Result Value Ref Range Status   Specimen Description BLOOD RIGHT HAND  Final   Special Requests IN PEDIATRIC BOTTLE 3ML  Final   Culture NO GROWTH 4 DAYS  Final   Report Status PENDING  Incomplete  Culture, blood (routine x 2)     Status: None (Preliminary result)   Collection Time: 11/19/15  1:28 PM  Result Value Ref Range Status   Specimen Description BLOOD LEFT HAND  Final   Special Requests IN PEDIATRIC BOTTLE 3ML  Final   Culture NO GROWTH 4 DAYS  Final   Report Status PENDING  Incomplete  Culture, blood (Routine X 2) w Reflex to ID Panel     Status: None (Preliminary result)   Collection Time: 11/22/15 10:20 PM  Result Value Ref Range Status   Specimen Description BLOOD RIGHT FOREARM  Final   Special Requests IN PEDIATRIC BOTTLE 1CC  Final   Culture NO GROWTH < 24 HOURS  Final   Report Status PENDING  Incomplete  Culture, blood (Routine X 2) w Reflex to ID Panel     Status: None (Preliminary result)   Collection Time: 11/22/15 10:39 PM  Result Value Ref Range Status   Specimen Description BLOOD LEFT HAND  Final   Special Requests IN PEDIATRIC BOTTLE 0.5CC  Final   Culture NO GROWTH < 24 HOURS  Final   Report Status PENDING  Incomplete     Scheduled Meds: . ampicillin-sulbactam (UNASYN) IV  3 g Intravenous Q6H  . cefTRIAXone (ROCEPHIN)  IV  2 g Intravenous Q12H  . feeding supplement (NEPRO CARB STEADY)  237 mL Oral BID  BM  . folic acid  1 mg Oral Daily  . hydrocortisone sod succinate (SOLU-CORTEF) inj  50 mg Intravenous Q12H  . multivitamin with minerals  1 tablet Oral Daily  . nicotine  14 mg Transdermal Daily  . [START ON 11/24/2015] predniSONE  40 mg Oral Q breakfast  . sodium bicarbonate  650 mg Oral BID  . sodium chloride flush  3 mL Intravenous Q12H  . thiamine  100 mg Oral Daily   Continuous Infusions:   Procedures/Studies: Dg Chest 2 View  Result Date: 11/16/2015 CLINICAL DATA:  Intermittent moderate cough beginning 2 days ago. Shortness breath. Moderate back and rib pain. Next healed EXAM: CHEST  2 VIEW COMPARISON:  Two-view chest x-ray 04/10/2013. FINDINGS: Multi focal right lower lobe airspace disease is present. Additional right upper lobe airspace disease present. There is prominence of the hila bilaterally. Linear densities are noted at the left base. The heart size is normal. The visualized soft tissues and bony thorax are unremarkable.  IMPRESSION: 1. Multi focal pneumonia involving the right lower lobe, right upper lobe, and possibly left lower lobe. 2. Moderate hilar prominence likely representing vascular congestion neck and possible reactive adenopathy. 3. Recommend follow-up two-view chest x-ray following appropriate treatment therapy. Electronically Signed   By: Marin Roberts M.D.   On: 11/16/2015 21:32   Ct Chest Wo Contrast  Result Date: 11/18/2015 CLINICAL DATA:  30 year old female with history of pulmonary infiltrates. Pneumonia. History of substance abuse. EXAM: CT CHEST WITHOUT CONTRAST TECHNIQUE: Multidetector CT imaging of the chest was performed following the standard protocol without IV contrast. COMPARISON:  No prior chest CT.  Chest x-ray 11/16/2015. FINDINGS: Cardiovascular: Heart size is normal. There is no significant pericardial fluid, thickening or pericardial calcification. Mediastinum/Nodes: Multiple prominent mediastinal lymph nodes measuring up to 1.5 cm in short  axis in the subcarinal nodal station. Please note that accurate exclusion of hilar adenopathy is limited on noncontrast CT scans. Esophagus is unremarkable in appearance. No axillary lymphadenopathy. Lungs/Pleura: Multifocal airspace consolidation is noted throughout the lungs bilaterally, most confluent in the posterior aspect of the right lower lobe. Several of the areas of apparent airspace consolidation are somewhat nodular in appearance, and there is one cavitary area in the right upper lobe (image 80 of series 3) which measures up to 1.7 cm in diameter, in addition to a smaller cavitary area in the periphery of the left lower lobe (image 89 of series 3). The overall spectrum of findings is highly concerning for multifocal pneumonia, presumably from septic emboli. Given the somewhat wedge-shaped appearance of the largest area of consolidation in the right lower lobe, the possibility of significant ischemia/infarct in the right lower lobe related to a larger pulmonary embolus is considered likely (image 71 of series 3). Trace bilateral pleural effusions lie dependently. Upper Abdomen: Although incompletely visualized, the spleen appears enlarged measuring up to 13.8 x 5.1 cm on axial images. Musculoskeletal: There are no aggressive appearing lytic or blastic lesions noted in the visualized portions of the skeleton. IMPRESSION: 1. The overall spectrum of findings in the lungs is compatible with multilobar pneumonia, likely from septic embolization. 2. The largest area of airspace consolidation in the right lower lobe has a very wedge-shaped in appearanceand is concerning for potential pulmonary infarct from larger embolus. This could be further evaluated with PE protocol CT scan if clinically appropriate. 3. Trace bilateral pleural effusions lying dependently. 4. Splenomegaly. These results will be called to the ordering clinician or representative by the Radiologist Assistant, and communication documented in the  PACS or zVision Dashboard. Electronically Signed   By: Trudie Reed M.D.   On: 11/18/2015 08:28   US Renal  Result Date: 11/17/2015 CLINICAL DATA:  Acute kidney injury.  Sepsis. EXAM: RENAL / URINARY TRACT ULTRASOUND COMPLETE COMPARISON:  None. FINDINGS: Right Kidney: Length: 13.8 cm, within normal limits. The kidney is somewhat echogenic. It is isoechoic to the index organ, the liver. No discrete mass lesion or stone is present. There is no hydronephrosis. Left Kidney: Length: 13.9 cm, within normal limits. The kidney is somewhat echogenic. It is isoechoic to the index organ, the spleen. No discrete mass lesion or stone is present. There is no hydronephrosis. Bladder: Appears normal for degree of bladder distention. Incidental note is made of multiple shadowing stones within the gallbladder. There is no significant wall thickening to suggest cholecystitis. IMPRESSION: 1. The kidneys are hyperechoic bilaterally. This is nonspecific, but can be seen in the setting of acute medical renal disease. 2. No renal  calculus or mass lesion.  No hydronephrosis. 3. Cholelithiasis without evidence for cholecystitis. Electronically Signed   By: Marin Roberts M.D.   On: 11/17/2015 22:27   Nm Pulmonary Perf And Vent  Result Date: 11/17/2015 CLINICAL DATA:  Shortness of breath, hemoptysis, elevated D-dimer, history asthma EXAM: NUCLEAR MEDICINE BOWEL (MECKEL'S) SCAN TECHNIQUE: Sequential abdominal images were obtained following intravenous injection of radiopharmaceutical. RADIOPHARMACEUTICALS:  mCi Tc-42m pertechnetate IV 30.3 mCi Technetium-2m DTPA aerosol inhalation and 4.0 mCi Technetium-64m MAA IV COMPARISON:  None. None Correlation:  Chest radiograph 11/16/2015 FINDINGS: Ventilation: Diminished ventilation at apices. Focal ventilation defect at medial LEFT lower lobe. Additional diminished ventilation in both lower lobes. Perfusion: Small subsegmental perfusion defect lateral RIGHT upper lobe corresponding  to focal opacity on chest radiograph. Additional large segmental defect medial RIGHT middle lobe corresponding to infiltrate on the chest radiograph. Additional perfusion defects in BILATERAL lower lobes, though stripe signs are present at both. Chest radiograph demonstrates multifocal multifocal infiltrates bilaterally. Presence of matching ventilation, perfusion, and radiographic abnormalities represents an intermediate probability for pulmonary embolism. IMPRESSION: Matching ventilatory, perfusion and radiographic abnormalities compatible with an intermediate probability for pulmonary embolism. Electronically Signed   By: Ulyses Southward M.D.   On: 11/17/2015 12:03   Dg Chest Port 1 View  Result Date: 11/22/2015 CLINICAL DATA:  Shortness of Breath EXAM: PORTABLE CHEST 1 VIEW November 19, 2015 and chest CT November 17, 2015 FINDINGS: There has been slight partial clearing of consolidation from the right lower lobe. Patchy infiltrate in the left perihilar region remains. A somewhat larger focus of consolidation in the medial left base remains. The cavitary area in the left apex remains stable as does a somewhat ill-defined nodular opacity in the right upper lobe. No new areas of opacity are evident. Heart size and pulmonary vascular normal. No adenopathy. No bone lesions evident. IMPRESSION: Slight clearing of infiltrate from the right lower lobe. Other areas of opacity/consolidation remain stable. There remains cavitation in a lesion in the left upper lobe. No new opacities are evident. Heart size within normal limits. Electronically Signed   By: Bretta Bang III M.D.   On: 11/22/2015 17:25   Dg Chest Port 1 View  Result Date: 11/19/2015 CLINICAL DATA:  Shortness of breath and chest pain EXAM: PORTABLE CHEST 1 VIEW COMPARISON:  11/17/2015 FINDINGS: Cardiac shadow is stable. Persistent right lower lobe infiltrate is noted. Patchy changes are noted bilaterally consistent with that seen on recent CT examination.  No acute bony abnormality is noted. IMPRESSION: Patchy changes bilaterally, worst in the right lower lobe consistent with multifocal pneumonia. Electronically Signed   By: Alcide Clever M.D.   On: 11/19/2015 11:21    Tramane Gorum, DO  [facility] Pager [phone]  If 7PM-7AM, please contact night-coverage [email or web address] Password [facility] 11/23/2015, 6:19 PM   LOS: 7 days

## 2015-11-23 NOTE — Progress Notes (Signed)
Pt transferred to 2W06. Vitals stable upon transfer. All pt's belongings with her including clothes, $20.00 bill, 2 phone charger, yogurt, and popsicle.  .Marland Kitchen

## 2015-11-23 NOTE — Progress Notes (Signed)
  Echocardiogram Echocardiogram Transesophageal has been performed.  Tye SavoyCasey N Shakeya Kerkman 11/23/2015, 12:16 PM

## 2015-11-23 NOTE — Progress Notes (Signed)
Day of Surgery Procedure(s) (LRB): TRANSESOPHAGEAL ECHOCARDIOGRAM (TEE) WITH ANESTHESIA (N/A) Subjective: Patient examined, transesophageal echocardiogram and chest CT scan personally reviewed and counseled with patient  30 year old female IV drug abuser presents with endocarditis and large vegetations of the tricuspid valve with MSSA and enterococcal bacteremia. She has bilateral septic emboli on CT scan of lungs. She has large vegetations on the tricuspid valve with mild-moderate tricuspid insufficiency. There is no evidence of RV decompensation or failure or involvement of other cardiac valves. She has no evidence of heart block. She is been started on Copp rinse of IV antibiotics by the ID service including ceftriaxone and Unasyn. She is currently afebrile. She appears to be in a terrible metabolic-nutritional status and is a poor candidate for any type surgery. She has very poor dental hygiene with some necrotic teeth as well. Objective: Vital signs in last 24 hours: Temp:  [98.3 F (36.8 C)-99.9 F (37.7 C)] 98.4 F (36.9 C) (08/22 1950) Pulse Rate:  [45-68] 47 (08/22 1215) Cardiac Rhythm: Normal sinus rhythm (08/22 1950) Resp:  [11-34] 20 (08/22 1950) BP: (96-143)/(32-83) 129/83 (08/22 1950) SpO2:  [96 %-100 %] 100 % (08/22 1950) Weight:  [112 lb 7 oz (51 kg)] 112 lb 7 oz (51 kg) (08/22 1051)  Hemodynamic parameters for last 24 hours:  afebrile sinus rhythm  Intake/Output from previous day: 08/21 0701 - 08/22 0700 In: 2473.3 [P.O.:360; I.V.:562.5; Blood:1100.8; IV Piggyback:450] Out: 150 [Urine:150] Intake/Output this shift: No intake/output data recorded.      Physical Exam  General: Thin chronically ill Caucasian female weak and fragile HEENT: Normocephalic pupils equal , dentition . Poor with necrotic teeth Neck: Supple without JVD, adenopathy, or bruit Chest: Clear to auscultation, symmetrical breath sounds, scattered rhonchi rhonchi, no tenderness             or  deformity Cardiovascular: Regular rate and rhythm, 1-2/6 TR murmur, no gallop, peripheral pulses             palpable in all extremities Abdomen:  Soft, nontender, no palpable mass or organomegaly Extremities: Warm, well-perfused, no clubbing cyanosis edema or tenderness,              no venous stasis changes of the legs Rectal/GU: Deferred Neuro: Grossly non--focal and symmetrical throughout Skin: Clean and dry without rash or ulceration   Lab Results:  Recent Labs  11/22/15 0238 11/22/15 2238 11/23/15 0257  WBC 7.9  --  11.0*  HGB 6.8* 9.1* 8.9*  HCT 22.0* 27.5* 27.1*  PLT 152  --  152   BMET:  Recent Labs  11/22/15 0238 11/23/15 0257  NA 139 139  K 3.1* 3.3*  CL 115* 113*  CO2 19* 19*  GLUCOSE 100* 139*  BUN 37* 21*  CREATININE 1.68* 1.23*  CALCIUM 7.4* 7.0*    PT/INR: No results for input(s): LABPROT, INR in the last 72 hours. ABG    Component Value Date/Time   PHART 7.421 11/16/2015 2144   HCO3 17.9 (L) 11/16/2015 2144   TCO2 19 11/16/2015 2144   ACIDBASEDEF 5.0 (H) 11/16/2015 2144   O2SAT 94.0 11/16/2015 2144   CBG (last 3)  No results for input(s): GLUCAP in the last 72 hours.  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

## 2015-11-23 NOTE — Transfer of Care (Signed)
Immediate Anesthesia Transfer of Care Note  Patient: Catherine Hernandez  Procedure(s) Performed: Procedure(s): TRANSESOPHAGEAL ECHOCARDIOGRAM (TEE) WITH ANESTHESIA (N/A)  Patient Location: PACU and Endoscopy Unit  Anesthesia Type:MAC  Level of Consciousness: awake, alert , oriented and patient cooperative  Airway & Oxygen Therapy: Patient Spontanous Breathing and Patient connected to nasal cannula oxygen  Post-op Assessment: Report given to RN and Post -op Vital signs reviewed and stable  Post vital signs: Reviewed and stable  Last Vitals:  Vitals:   11/23/15 1110 11/23/15 1115  BP:    Pulse: (!) 58 68  Resp:    Temp:      Last Pain:  Vitals:   11/23/15 1051  TempSrc: Oral  PainSc:          Complications: No apparent anesthesia complications

## 2015-11-23 NOTE — Anesthesia Postprocedure Evaluation (Signed)
Anesthesia Post Note  Patient: Catherine Hernandez  Procedure(s) Performed: Procedure(s) (LRB): TRANSESOPHAGEAL ECHOCARDIOGRAM (TEE) WITH ANESTHESIA (N/A)  Patient location during evaluation: PACU Anesthesia Type: MAC Level of consciousness: awake and alert Pain management: pain level controlled Vital Signs Assessment: post-procedure vital signs reviewed and stable Respiratory status: spontaneous breathing, nonlabored ventilation, respiratory function stable and patient connected to nasal cannula oxygen Cardiovascular status: stable and blood pressure returned to baseline Anesthetic complications: no    Last Vitals:  Vitals:   11/23/15 1205 11/23/15 1215  BP: 120/66 116/72  Pulse: (!) 49 (!) 47  Resp: (!) 31 (!) 32  Temp:      Last Pain:  Vitals:   11/23/15 1300  TempSrc:   PainSc: 5                  Kennieth RadFitzgerald, Yzabella Crunk E

## 2015-11-23 NOTE — H&P (View-Only) (Signed)
Patient ID: Catherine Hernandez, female   DOB: 05/29/1985, 30 y.o.   MRN: [id number]          [facility]    Date of Admission:  11/16/2015   Total days of antibiotics 6        Day 5 ampicillin sulbactam         Principal Problem:   Endocarditis of native valve Active Problems:   Sepsis (HCC)   CAP (community acquired pneumonia)   Enterococcal bacteremia   Staphylococcus aureus bacteremia   Polysubstance dependence including opioid type drug with complication, continuous use (HCC)   Opioid use with withdrawal (HCC)   Hyponatremia   Acute renal failure (HCC)   Hypocalcemia   Nonspecific abnormal electrocardiogram (ECG) (EKG)   Thrombocytopenia (HCC)   Normocytic anemia   Protein-calorie malnutrition (HCC)   Poor dentition   AKI (acute kidney injury) (HCC)   Malnutrition of moderate degree   Polysubstance abuse   . ampicillin-sulbactam (UNASYN) IV  3 g Intravenous Q6H  . feeding supplement (NEPRO CARB STEADY)  237 mL Oral BID BM  . folic acid  1 mg Oral Daily  . hydrocortisone sod succinate (SOLU-CORTEF) inj  50 mg Intravenous Q12H  . multivitamin with minerals  1 tablet Oral Daily  . nicotine  14 mg Transdermal Daily  . potassium chloride  40 mEq Oral Once  . sodium bicarbonate  650 mg Oral BID  . sodium chloride flush  3 mL Intravenous Q12H  . thiamine  100 mg Oral Daily    SUBJECTIVE: She is feeling better. She is no longer having severe chest pain and shortness of breath. She is not having any dysuria or diarrhea.  Review of Systems: Review of Systems  Constitutional: Positive for fever. Negative for chills, diaphoresis, malaise/fatigue and weight loss.  HENT: Negative for sore throat.   Respiratory: Negative for cough, sputum production and shortness of breath.   Cardiovascular: Negative for chest pain.  Gastrointestinal: Negative for abdominal pain, diarrhea, nausea and vomiting.  Genitourinary: Negative for dysuria.  Musculoskeletal:  Negative for joint pain and myalgias.  Skin: Negative for rash.  Neurological: Negative for headaches.  Psychiatric/Behavioral: Positive for substance abuse.    Past Medical History:  Diagnosis Date  . CAP (community acquired pneumonia) 11/16/2015  . Childhood asthma   . Heroin abuse   . Pneumonia 2007  . Polysubstance abuse     Social History  Substance Use Topics  . Smoking status: Current Every Day Smoker    Packs/day: 0.50    Years: 10.00    Types: Cigarettes  . Smokeless tobacco: Never Used  . Alcohol use No    Family History  Problem Relation Age of Onset  . Anesthesia problems Neg Hx   . Hypotension Neg Hx   . Malignant hyperthermia Neg Hx   . Pseudochol deficiency Neg Hx    No Known Allergies  OBJECTIVE: Vitals:   11/22/15 0600 11/22/15 0648 11/22/15 0823 11/22/15 0845  BP: 134/80  115/63 124/66  Pulse: 97 (!) 108 (!) 121   Resp:   16 (!) 22  Temp:  (!) 101.1 F (38.4 C) 99.6 F (37.6 C) 99.4 F (37.4 C)  TempSrc:  Oral Oral Oral  SpO2: 95% 94% 95%   Weight:      Height:       Body mass index is 19.15 kg/m.  Physical Exam  Constitutional: She is oriented to person, place, and time.  She is alert and appears  comfortable resting in bed.  HENT:  Mouth/Throat: No oropharyngeal exudate.  Cardiovascular: Normal rate and regular rhythm.   No murmur heard. Pulmonary/Chest: Effort normal and breath sounds normal.  Abdominal: Soft. There is no tenderness.  Musculoskeletal: Normal range of motion. She exhibits no edema or tenderness.  Neurological: She is alert and oriented to person, place, and time.  Skin: No rash noted.  Right arm IV site looks okay.  Psychiatric: Mood and affect normal.    Lab Results Lab Results  Component Value Date   WBC 7.9 11/22/2015   HGB 6.8 (LL) 11/22/2015   HCT 22.0 (L) 11/22/2015   MCV 83.3 11/22/2015   PLT 152 11/22/2015    Lab Results  Component Value Date   CREATININE 1.68 (H) 11/22/2015   BUN 37 (H)  11/22/2015   NA 139 11/22/2015   K 3.1 (L) 11/22/2015   CL 115 (H) 11/22/2015   CO2 19 (L) 11/22/2015    Lab Results  Component Value Date   ALT 11 (L) 11/18/2015   AST 23 11/18/2015   ALKPHOS 50 11/18/2015   BILITOT 0.5 11/18/2015     Microbiology: Recent Results (from the past 240 hour(s))  Blood Culture (routine x 2)     Status: Abnormal   Collection Time: 11/16/15  8:30 PM  Result Value Ref Range Status   Specimen Description BLOOD RIGHT ARM  Final   Special Requests BOTTLES DRAWN AEROBIC AND ANAEROBIC 5CC  Final   Culture  Setup Time   Final    GRAM POSITIVE COCCI IN CLUSTERS IN BOTH AEROBIC AND ANAEROBIC BOTTLES CRITICAL RESULT CALLED TO, READ BACK BY AND VERIFIED WITH: L POWELL PHARMD 1800 11/17/15 A BROWNING    Culture (A)  Final    STAPHYLOCOCCUS AUREUS ENTEROCOCCUS FAECALIS SUSCEPTIBILITIES PERFORMED ON PREVIOUS CULTURE WITHIN THE LAST 5 DAYS. Performed at [facility]    Report Status 11/21/2015 FINAL  Final   Organism ID, Bacteria STAPHYLOCOCCUS AUREUS  Final      Susceptibility   Staphylococcus aureus - MIC*    CIPROFLOXACIN <=0.5 SENSITIVE Sensitive     ERYTHROMYCIN <=0.25 SENSITIVE Sensitive     GENTAMICIN <=0.5 SENSITIVE Sensitive     OXACILLIN 0.5 SENSITIVE Sensitive     TETRACYCLINE <=1 SENSITIVE Sensitive     VANCOMYCIN <=0.5 SENSITIVE Sensitive     TRIMETH/SULFA <=10 SENSITIVE Sensitive     CLINDAMYCIN <=0.25 SENSITIVE Sensitive     RIFAMPIN <=0.5 SENSITIVE Sensitive     Inducible Clindamycin NEGATIVE Sensitive     * STAPHYLOCOCCUS AUREUS  Blood Culture ID Panel (Reflexed)     Status: Abnormal   Collection Time: 11/16/15  8:30 PM  Result Value Ref Range Status   Enterococcus species DETECTED (A) NOT DETECTED Final    Comment: CRITICAL RESULT CALLED TO, READ BACK BY AND VERIFIED WITH: L POWELL PHARMD 1800 11/17/15 A BROWNING    Vancomycin resistance NOT DETECTED NOT DETECTED Final   Listeria monocytogenes NOT DETECTED NOT DETECTED Final     Staphylococcus species DETECTED (A) NOT DETECTED Final    Comment: CRITICAL RESULT CALLED TO, READ BACK BY AND VERIFIED WITH: L POWELL PHARMD 1800 11/17/15 A BROWNING    Staphylococcus aureus DETECTED (A) NOT DETECTED Final    Comment: CRITICAL RESULT CALLED TO, READ BACK BY AND VERIFIED WITH: L POWELL PHARMD 1800 11/17/15 A BROWNING    Methicillin resistance NOT DETECTED NOT DETECTED Final   Streptococcus species NOT DETECTED NOT DETECTED Final   Streptococcus agalactiae NOT DETECTED NOT  DETECTED Final   Streptococcus pneumoniae NOT DETECTED NOT DETECTED Final   Streptococcus pyogenes NOT DETECTED NOT DETECTED Final   Acinetobacter baumannii NOT DETECTED NOT DETECTED Final   Enterobacteriaceae species NOT DETECTED NOT DETECTED Final   Enterobacter cloacae complex NOT DETECTED NOT DETECTED Final   Escherichia coli NOT DETECTED NOT DETECTED Final   Klebsiella oxytoca NOT DETECTED NOT DETECTED Final   Klebsiella pneumoniae NOT DETECTED NOT DETECTED Final   Proteus species NOT DETECTED NOT DETECTED Final   Serratia marcescens NOT DETECTED NOT DETECTED Final   Carbapenem resistance NOT DETECTED NOT DETECTED Final   Haemophilus influenzae NOT DETECTED NOT DETECTED Final   Neisseria meningitidis NOT DETECTED NOT DETECTED Final   Pseudomonas aeruginosa NOT DETECTED NOT DETECTED Final   Candida albicans NOT DETECTED NOT DETECTED Final   Candida glabrata NOT DETECTED NOT DETECTED Final   Candida krusei NOT DETECTED NOT DETECTED Final   Candida parapsilosis NOT DETECTED NOT DETECTED Final   Candida tropicalis NOT DETECTED NOT DETECTED Final    Comment: Performed [facility]  Urine culture     Status: Abnormal   Collection Time: 11/16/15  8:50 PM  Result Value Ref Range Status   Specimen Description URINE, RANDOM  Final   Special Requests NONE  Final   Culture MULTIPLE SPECIES PRESENT, SUGGEST RECOLLECTION (A)  Final   Report Status 11/18/2015 FINAL  Final  Blood Culture  (routine x 2)     Status: Abnormal   Collection Time: 11/16/15  9:30 PM  Result Value Ref Range Status   Specimen Description BLOOD LEFT FOREARM  Final   Special Requests BOTTLES DRAWN AEROBIC AND ANAEROBIC 5CC  Final   Culture  Setup Time   Final    GRAM POSITIVE COCCI IN CLUSTERS IN BOTH AEROBIC AND ANAEROBIC BOTTLES CRITICAL RESULT CALLED TO, READ BACK BY AND VERIFIED WITH: L POWELL PHARMD 1800 11/17/15 A BROWNING    Culture (A)  Final    STAPHYLOCOCCUS AUREUS SUSCEPTIBILITIES PERFORMED ON PREVIOUS CULTURE WITHIN THE LAST 5 DAYS. Performed [facility]    Report Status 11/20/2015 FINAL  Final  MRSA PCR Screening     Status: Abnormal   Collection Time: 11/17/15 12:45 AM  Result Value Ref Range Status   MRSA by PCR POSITIVE (A) NEGATIVE Final    Comment:        The GeneXpert MRSA Assay (FDA approved for NASAL specimens only), is one component of a comprehensive MRSA colonization surveillance program. It is not intended to diagnose MRSA infection nor to guide or monitor treatment for MRSA infections. RESULT CALLED TO, READ BACK BY AND VERIFIED WITH: K ABDUSSURAN,RN @0440  11/17/15 MKELLY   Culture, blood (x 2)     Status: Abnormal   Collection Time: 11/17/15  2:35 AM  Result Value Ref Range Status   Specimen Description BLOOD BLOOD LEFT HAND  Final   Special Requests IN PEDIATRIC BOTTLE 1CC  Final   Culture  Setup Time   Final    GRAM POSITIVE COCCI IN CLUSTERS AEROBIC BOTTLE ONLY CRITICAL VALUE NOTED.  VALUE IS CONSISTENT WITH PREVIOUSLY REPORTED AND CALLED VALUE.    Culture (A)  Final    STAPHYLOCOCCUS AUREUS SUSCEPTIBILITIES PERFORMED ON PREVIOUS CULTURE WITHIN THE LAST 5 DAYS.    Report Status 11/20/2015 FINAL  Final  Culture, blood (x 2)     Status: Abnormal   Collection Time: 11/17/15  2:45 AM  Result Value Ref Range Status   Specimen Description BLOOD BLOOD RIGHT  HAND  Final   Special Requests IN PEDIATRIC BOTTLE 2CC  Final   Culture  Setup Time    Final    GRAM POSITIVE COCCI IN CLUSTERS AEROBIC BOTTLE ONLY CRITICAL RESULT CALLED TO, READ BACK BY AND VERIFIED WITH: L POWELL PHARMD 1800 11/17/15 A BROWNING    Culture (A)  Final    STAPHYLOCOCCUS AUREUS SUSCEPTIBILITIES PERFORMED ON PREVIOUS CULTURE WITHIN THE LAST 5 DAYS.    Report Status 11/20/2015 FINAL  Final  Culture, blood (Routine X 2) w Reflex to ID Panel     Status: Abnormal   Collection Time: 11/18/15  7:10 AM  Result Value Ref Range Status   Specimen Description BLOOD RIGHT HAND  Final   Special Requests IN PEDIATRIC BOTTLE 3CC  Final   Culture  Setup Time   Final    GRAM POSITIVE COCCI IN CLUSTERS AEROBIC BOTTLE ONLY CRITICAL RESULT CALLED TO, READ BACK BY AND VERIFIED WITH: G ABBOTT PHARMD 2302 11/18/15 A BROWNING    Culture (A)  Final    STAPHYLOCOCCUS AUREUS SUSCEPTIBILITIES PERFORMED ON PREVIOUS CULTURE WITHIN THE LAST 5 DAYS. ENTEROCOCCUS FAECALIS    Report Status 11/21/2015 FINAL  Final   Organism ID, Bacteria ENTEROCOCCUS FAECALIS  Final      Susceptibility   Enterococcus faecalis - MIC*    AMPICILLIN <=2 SENSITIVE Sensitive     VANCOMYCIN 2 SENSITIVE Sensitive     GENTAMICIN SYNERGY SENSITIVE Sensitive     * ENTEROCOCCUS FAECALIS  Culture, blood (Routine X 2) w Reflex to ID Panel     Status: Abnormal   Collection Time: 11/18/15  8:41 AM  Result Value Ref Range Status   Specimen Description BLOOD LEFT ARM  Final   Special Requests IN PEDIATRIC BOTTLE 3CC  Final   Culture  Setup Time   Final    GRAM POSITIVE COCCI IN CLUSTERS IN PEDIATRIC BOTTLE CRITICAL RESULT CALLED TO, READ BACK BY AND VERIFIED WITH: G ABBOTT PHARMD 2302 11/18/15 A BROWNING    Culture (A)  Final    STAPHYLOCOCCUS AUREUS SUSCEPTIBILITIES PERFORMED ON PREVIOUS CULTURE WITHIN THE LAST 5 DAYS.    Report Status 11/20/2015 FINAL  Final  Culture, blood (routine x 2)     Status: None (Preliminary result)   Collection Time: 11/19/15  1:25 PM  Result Value Ref Range Status   Specimen  Description BLOOD RIGHT HAND  Final   Special Requests IN PEDIATRIC BOTTLE  Final   Culture NO GROWTH 2 DAYS  Final   Report Status PENDING  Incomplete  Culture, blood (routine x 2)     Status: None (Preliminary result)   Collection Time: 11/19/15  1:28 PM  Result Value Ref Range Status   Specimen Description BLOOD LEFT HAND  Final   Special Requests IN PEDIATRIC BOTTLE  Final   Culture NO GROWTH 2 DAYS  Final   Report Status PENDING  Incomplete     ASSESSMENT: She is looking and feeling better and her repeat blood cultures are negative at 48 hours however she had renewed fever up to 102.7 last night. The source of the fever is unclear. I will continue ampicillin sulbactam pending further observation, final culture results and TEE results. Our pharmacist pointed out that enterococcus was isolated a second time on repeat cultures. Therefore I will start double coverage by adding ceftriaxone.  PLAN: 1. Continue ampicillin sulbactam 2. Start ceftriaxone  Cliffton Asters, MD [facility] Infect[facility] [phone] pager   [phone]  cell 11/22/2015, 10:20 AM

## 2015-11-23 NOTE — Interval H&P Note (Signed)
History and Physical Interval Note:  11/23/2015 10:43 AM  Catherine Hernandez  has presented today for surgery, with the diagnosis of bacteremia  The various methods of treatment have been discussed with the patient and family. After consideration of risks, benefits and other options for treatment, the patient has consented to  Procedure(s): TRANSESOPHAGEAL ECHOCARDIOGRAM (TEE) WITH ANESTHESIA (N/A) as a surgical intervention .  The patient's history has been reviewed, patient examined, no change in status, stable for surgery.  I have reviewed the patient's chart and labs.  Questions were answered to the patient's satisfaction.     Armanda Magicraci [name]

## 2015-11-23 NOTE — CV Procedure (Signed)
    PROCEDURE NOTE:  Procedure:  Transesophageal echocardiogram Operator:  Armanda Magicraci Malon Siddall, MD Indications:  Endocarditis Complications: None   During this procedure the patient is administered a total of Propofol 290 mg to achieve and maintain moderate conscious sedation by .  The patient's heart rate, blood pressure, and oxygen saturation are monitored continuously during the procedure.   Results: Normal LV size and function Normal RV size and function Normal RA Normal LA There are 2 large shaggy and very mobile vegetations on both leaflets of the TV.  The largest vegetation measures 2.63 x 1.2cm in diameter and prolapses into the RV.  The other vegetation measures .98x1.6cm in diameter. Normal PV Normal MV with mild MR Normal trileaflet AV  Normal interatrial septum with no evidence of shunt by colorflow dopper  Normal thoracic and ascending aorta.  The patient tolerated the procedure well and was transferred back to their room in stable condition.  Signed: Armanda Magicraci Ronald Vinsant, MD [facility]

## 2015-11-23 NOTE — Progress Notes (Signed)
Patient ID: Catherine Hernandez, female   DOB: 1985/08/09, 30 y.o.   MRN: [id number]          [facility]    Date of Admission:  11/16/2015   Total days of antibiotics 7        Day 6 ampicillin sulbactam        Day 2 ceftriaxone Principal Problem:   Endocarditis of native valve Active Problems:   Sepsis (HCC)   CAP (community acquired pneumonia)   Enterococcal bacteremia   Staphylococcus aureus bacteremia   Polysubstance dependence including opioid type drug with complication, continuous use (HCC)   Opioid use with withdrawal (HCC)   Hyponatremia   Acute renal failure (HCC)   Hypocalcemia   Nonspecific abnormal electrocardiogram (ECG) (EKG)   Thrombocytopenia (HCC)   Normocytic anemia   Protein-calorie malnutrition (HCC)   Poor dentition   AKI (acute kidney injury) (HCC)   Malnutrition of moderate degree   Polysubstance abuse   . ampicillin-sulbactam (UNASYN) IV  3 g Intravenous Q6H  . cefTRIAXone (ROCEPHIN)  IV  2 g Intravenous Q12H  . feeding supplement (NEPRO CARB STEADY)  237 mL Oral BID BM  . folic acid  1 mg Oral Daily  . hydrocortisone sod succinate (SOLU-CORTEF) inj  50 mg Intravenous Q12H  . multivitamin with minerals  1 tablet Oral Daily  . nicotine  14 mg Transdermal Daily  . [START ON 11/24/2015] predniSONE  40 mg Oral Q breakfast  . sodium bicarbonate  650 mg Oral BID  . sodium chloride flush  3 mL Intravenous Q12H  . thiamine  100 mg Oral Daily    SUBJECTIVE: She is feeling better.   Review of Systems: Review of Systems  Constitutional: Negative for chills, diaphoresis, fever, malaise/fatigue and weight loss.  HENT: Negative for sore throat.   Respiratory: Negative for cough, sputum production and shortness of breath.   Cardiovascular: Negative for chest pain.  Gastrointestinal: Negative for abdominal pain, diarrhea, nausea and vomiting.  Genitourinary: Negative for dysuria.  Musculoskeletal: Negative for joint pain and  myalgias.  Skin: Negative for rash.  Neurological: Negative for headaches.  Psychiatric/Behavioral: Positive for substance abuse.    Past Medical History:  Diagnosis Date  . CAP (community acquired pneumonia) 11/16/2015  . Childhood asthma   . Heroin abuse   . Pneumonia 2007  . Polysubstance abuse     Social History  Substance Use Topics  . Smoking status: Current Every Day Smoker    Packs/day: 0.50    Years: 10.00    Types: Cigarettes  . Smokeless tobacco: Never Used  . Alcohol use No    Family History  Problem Relation Age of Onset  . Anesthesia problems Neg Hx   . Hypotension Neg Hx   . Malignant hyperthermia Neg Hx   . Pseudochol deficiency Neg Hx    No Known Allergies  OBJECTIVE: Vitals:   11/23/15 1115 11/23/15 1155 11/23/15 1205 11/23/15 1215  BP:  117/65 120/66 116/72  Pulse: 68 (!) 57 (!) 49 (!) 47  Resp:  11 (!) 31 (!) 32  Temp:      TempSrc:      SpO2: 99% 97% 99% 100%  Weight:      Height:       Body mass index is 19.3 kg/m.  Physical Exam  Constitutional: She is oriented to person, place, and time.  She is comfortable. She just returned from TEE.  HENT:  Mouth/Throat: No oropharyngeal exudate.  Cardiovascular: Normal  rate and regular rhythm.   No murmur heard. Pulmonary/Chest: Effort normal and breath sounds normal.  Abdominal: Soft. There is no tenderness.  Musculoskeletal: Normal range of motion. She exhibits no edema or tenderness.  Neurological: She is alert and oriented to person, place, and time.  Skin: No rash noted.  Psychiatric: Mood and affect normal.    Lab Results Lab Results  Component Value Date   WBC 11.0 (H) 11/23/2015   HGB 8.9 (L) 11/23/2015   HCT 27.1 (L) 11/23/2015   MCV 83.4 11/23/2015   PLT 152 11/23/2015    Lab Results  Component Value Date   CREATININE 1.23 (H) 11/23/2015   BUN 21 (H) 11/23/2015   NA 139 11/23/2015   K 3.3 (L) 11/23/2015   CL 113 (H) 11/23/2015   CO2 19 (L) 11/23/2015    Lab Results   Component Value Date   ALT 11 (L) 11/18/2015   AST 23 11/18/2015   ALKPHOS 50 11/18/2015   BILITOT 0.5 11/18/2015     Microbiology: Recent Results (from the past 240 hour(s))  Blood Culture (routine x 2)     Status: Abnormal   Collection Time: 11/16/15  8:30 PM  Result Value Ref Range Status   Specimen Description BLOOD RIGHT ARM  Final   Special Requests BOTTLES DRAWN AEROBIC AND ANAEROBIC 5CC  Final   Culture  Setup Time   Final    GRAM POSITIVE COCCI IN CLUSTERS IN BOTH AEROBIC AND ANAEROBIC BOTTLES CRITICAL RESULT CALLED TO, READ BACK BY AND VERIFIED WITH: L POWELL PHARMD 1800 11/17/15 A BROWNING    Culture (A)  Final    STAPHYLOCOCCUS AUREUS ENTEROCOCCUS FAECALIS SUSCEPTIBILITIES PERFORMED ON PREVIOUS CULTURE WITHIN THE LAST 5 DAYS. Performed at [facility]    Report Status 11/21/2015 FINAL  Final   Organism ID, Bacteria STAPHYLOCOCCUS AUREUS  Final      Susceptibility   Staphylococcus aureus - MIC*    CIPROFLOXACIN <=0.5 SENSITIVE Sensitive     ERYTHROMYCIN <=0.25 SENSITIVE Sensitive     GENTAMICIN <=0.5 SENSITIVE Sensitive     OXACILLIN 0.5 SENSITIVE Sensitive     TETRACYCLINE <=1 SENSITIVE Sensitive     VANCOMYCIN <=0.5 SENSITIVE Sensitive     TRIMETH/SULFA <=10 SENSITIVE Sensitive     CLINDAMYCIN <=0.25 SENSITIVE Sensitive     RIFAMPIN <=0.5 SENSITIVE Sensitive     Inducible Clindamycin NEGATIVE Sensitive     * STAPHYLOCOCCUS AUREUS  Blood Culture ID Panel (Reflexed)     Status: Abnormal   Collection Time: 11/16/15  8:30 PM  Result Value Ref Range Status   Enterococcus species DETECTED (A) NOT DETECTED Final    Comment: CRITICAL RESULT CALLED TO, READ BACK BY AND VERIFIED WITH: L POWELL PHARMD 1800 11/17/15 A BROWNING    Vancomycin resistance NOT DETECTED NOT DETECTED Final   Listeria monocytogenes NOT DETECTED NOT DETECTED Final   Staphylococcus species DETECTED (A) NOT DETECTED Final    Comment: CRITICAL RESULT CALLED TO, READ BACK BY AND VERIFIED  WITH: L POWELL PHARMD 1800 11/17/15 A BROWNING    Staphylococcus aureus DETECTED (A) NOT DETECTED Final    Comment: CRITICAL RESULT CALLED TO, READ BACK BY AND VERIFIED WITH: L POWELL PHARMD 1800 11/17/15 A BROWNING    Methicillin resistance NOT DETECTED NOT DETECTED Final   Streptococcus species NOT DETECTED NOT DETECTED Final   Streptococcus agalactiae NOT DETECTED NOT DETECTED Final   Streptococcus pneumoniae NOT DETECTED NOT DETECTED Final   Streptococcus pyogenes NOT DETECTED NOT DETECTED Final  Acinetobacter baumannii NOT DETECTED NOT DETECTED Final   Enterobacteriaceae species NOT DETECTED NOT DETECTED Final   Enterobacter cloacae complex NOT DETECTED NOT DETECTED Final   Escherichia coli NOT DETECTED NOT DETECTED Final   Klebsiella oxytoca NOT DETECTED NOT DETECTED Final   Klebsiella pneumoniae NOT DETECTED NOT DETECTED Final   Proteus species NOT DETECTED NOT DETECTED Final   Serratia marcescens NOT DETECTED NOT DETECTED Final   Carbapenem resistance NOT DETECTED NOT DETECTED Final   Haemophilus influenzae NOT DETECTED NOT DETECTED Final   Neisseria meningitidis NOT DETECTED NOT DETECTED Final   Pseudomonas aeruginosa NOT DETECTED NOT DETECTED Final   Candida albicans NOT DETECTED NOT DETECTED Final   Candida glabrata NOT DETECTED NOT DETECTED Final   Candida krusei NOT DETECTED NOT DETECTED Final   Candida parapsilosis NOT DETECTED NOT DETECTED Final   Candida tropicalis NOT DETECTED NOT DETECTED Final    Comment: [facility]formed [facility]  Urine culture     Status: Abnormal   Collection Time: 11/16/15  8:50 PM  Result Value Ref Range Status   Specimen Description URINE, RANDOM  Final   Special Requests NONE  Final   Culture MULTIPLE SPECIES PRESENT, SUGGEST RECOLLECTION (A)  Final   Report Status 11/18/2015 FINAL  Final  Blood Culture (routine x 2)     Status: Abnormal   Collection Time: 11/16/15  9:30 PM  Result Value Ref Range Status   Specimen Description  BLOOD LEFT FOREARM  Final   Special Requests BOTTLES DRAWN AEROBIC AND ANAEROBIC 5CC  Final   Culture  Setup Time   Final    GRAM POSITIVE COCCI IN CLUSTERS IN BOTH AEROBIC AND ANAEROBIC BOTTLES CRITICAL RESULT CALLED TO, READ BACK BY AND VERIFIED WITH: L POWELL PHARMD 1800 11/17/15 A BROWNING    Culture (A)  Final    STAPHYLOCOCCUS AUREUS SUSCEPTIBILITIES PERFORMED ON PREVIOUS CULTURE WITHIN THE LAST 5 DAYS. [facility]     Report Status 11/20/2015 FINAL  Final  MRSA PCR Screening     Status: Abnormal   Collection Time: 11/17/15 12:45 AM  Result Value Ref Range Status   MRSA by PCR POSITIVE (A) NEGATIVE Final    Comment:        The GeneXpert MRSA Assay (FDA approved for NASAL specimens only), is one component of a comprehensive MRSA colonization surveillance program. It is not intended to diagnose MRSA infection nor to guide or monitor treatment for MRSA infections. RESULT CALLED TO, READ BACK BY AND VERIFIED WITH: K ABDUSSURAN,RN @0440  11/17/15 MKELLY   Culture, blood (x 2)     Status: Abnormal   Collection Time: 11/17/15  2:35 AM  Result Value Ref Range Status   Specimen Description BLOOD BLOOD LEFT HAND  Final   Special Requests IN PEDIATRIC BOTTLE 1CC  Final   Culture  Setup Time   Final    GRAM POSITIVE COCCI IN CLUSTERS AEROBIC BOTTLE ONLY CRITICAL VALUE NOTED.  VALUE IS CONSISTENT WITH PREVIOUSLY REPORTED AND CALLED VALUE.    Culture (A)  Final    STAPHYLOCOCCUS AUREUS SUSCEPTIBILITIES PERFORMED ON PREVIOUS CULTURE WITHIN THE LAST 5 DAYS.    Report Status 11/20/2015 FINAL  Final  Culture, blood (x 2)     Status: Abnormal   Collection Time: 11/17/15  2:45 AM  Result Value Ref Range Status   Specimen Description BLOOD BLOOD RIGHT HAND  Final   Special Requests IN PEDIATRIC BOTTLE [facility]2CC  Final   Culture  Setup Time   Final  GRAM POSITIVE COCCI IN CLUSTERS AEROBIC BOTTLE ONLY CRITICAL RESULT CALLED TO, READ BACK BY AND VERIFIED WITH: L  POWELL PHARMD 1800 11/17/15 A BROWNING    Culture (A)  Final    STAPHYLOCOCCUS AUREUS SUSCEPTIBILITIES PERFORMED ON PREVIOUS CULTURE WITHIN THE LAST 5 DAYS.    Report Status 11/20/2015 FINAL  Final  Culture, blood (Routine X 2) w Reflex to ID Panel     Status: Abnormal   Collection Time: 11/18/15  7:10 AM  Result Value Ref Range Status   Specimen Description BLOOD RIGHT HAND  Final   Special Requests IN PEDIATRIC BOTTLE 3CC  Final   Culture  Setup Time   Final    GRAM POSITIVE COCCI IN CLUSTERS AEROBIC BOTTLE ONLY CRITICAL RESULT CALLED TO, READ BACK BY AND VERIFIED WITH: G ABBOTT PHARMD 2302 11/18/15 A BROWNING    Culture (A)  Final    STAPHYLOCOCCUS AUREUS SUSCEPTIBILITIES PERFORMED ON PREVIOUS CULTURE WITHIN THE LAST 5 DAYS. ENTEROCOCCUS FAECALIS    Report Status 11/21/2015 FINAL  Final   Organism ID, Bacteria ENTEROCOCCUS FAECALIS  Final      Susceptibility   Enterococcus faecalis - MIC*    AMPICILLIN <=2 SENSITIVE Sensitive     VANCOMYCIN 2 SENSITIVE Sensitive     GENTAMICIN SYNERGY SENSITIVE Sensitive     * ENTEROCOCCUS FAECALIS  Culture, blood (Routine X 2) w Reflex to ID Panel     Status: Abnormal   Collection Time: 11/18/15  8:41 AM  Result Value Ref Range Status   Specimen Description BLOOD LEFT ARM  Final   Special Requests IN PEDIATRIC BOTTLE 3CC  Final   Culture  Setup Time   Final    GRAM POSITIVE COCCI IN CLUSTERS IN PEDIATRIC BOTTLE CRITICAL RESULT CALLED TO, READ BACK BY AND VERIFIED WITH: G ABBOTT PHARMD 2302 11/18/15 A BROWNING    Culture (A)  Final    STAPHYLOCOCCUS AUREUS SUSCEPTIBILITIES PERFORMED ON PREVIOUS CULTURE WITHIN THE LAST 5 DAYS.    Report Status 11/20/2015 FINAL  Final  Culture, blood (routine x 2)     Status: None (Preliminary result)   Collection Time: 11/19/15  1:25 PM  Result Value Ref Range Status   Specimen Description BLOOD RIGHT HAND  Final   Special Requests IN PEDIATRIC BOTTLE 3ML  Final   Culture NO GROWTH 3 DAYS  Final    Report Status PENDING  Incomplete  Culture, blood (routine x 2)     Status: None (Preliminary result)   Collection Time: 11/19/15  1:28 PM  Result Value Ref Range Status   Specimen Description BLOOD LEFT HAND  Final   Special Requests IN PEDIATRIC BOTTLE 3ML  Final   Culture NO GROWTH 3 DAYS  Final   Report Status PENDING  Incomplete  Culture, blood (Routine X 2) w Reflex to ID Panel     Status: None (Preliminary result)   Collection Time: 11/22/15 10:20 PM  Result Value Ref Range Status   Specimen Description BLOOD RIGHT FOREARM  Final   Special Requests IN PEDIATRIC BOTTLE 1CC  Final   Culture PENDING  Incomplete   Report Status PENDING  Incomplete   Transesophageal echocardiogram 11/23/2015  There are 2 large shaggy and very mobile vegetations on both leaflets of the TV.  The largest vegetation measures 2.63 x 1.2cm in diameter and prolapses into the RV.  The other vegetation measures .98x1.6cm in diameter.  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 Recent blood  cultures are negative. I will continue ampicillin sulbactam and ceftriaxone. Although some patients with "uncomplicated right-sided endocarditis" can be cured with a 2 week course of IV antibiotics I think that the size of her vegetations warrents a longer course of therapy. Ideally, I would like to give her 4 weeks of IV therapy if it can be arranged in a skilled nursing facility. She seems amenable to this plan. Prior to admission she was living in a motel by herself. I would prefer to hold off on PICC placement until we have more certainty that SNF placement can be arranged and she is agreeable.  PLAN: 1. Continue ampicillin sulbactam and ceftriaxone 2. Recommend case manager to start discharge planning  Cliffton Asters, MD [facility] [phone] pager   [phone] cell 11/23/2015, 1:25 PM

## 2015-11-24 DIAGNOSIS — D696 Thrombocytopenia, unspecified: Secondary | ICD-10-CM

## 2015-11-24 LAB — URINE CULTURE

## 2015-11-24 LAB — BASIC METABOLIC PANEL
ANION GAP: 11 (ref 5–15)
BUN: 23 mg/dL — ABNORMAL HIGH (ref 6–20)
CHLORIDE: 113 mmol/L — AB (ref 101–111)
CO2: 16 mmol/L — AB (ref 22–32)
CREATININE: 1.08 mg/dL — AB (ref 0.44–1.00)
Calcium: 6.7 mg/dL — ABNORMAL LOW (ref 8.9–10.3)
Glucose, Bld: 174 mg/dL — ABNORMAL HIGH (ref 65–99)
POTASSIUM: 4.3 mmol/L (ref 3.5–5.1)
SODIUM: 140 mmol/L (ref 135–145)

## 2015-11-24 LAB — CULTURE, BLOOD (ROUTINE X 2)
Culture: NO GROWTH
Culture: NO GROWTH

## 2015-11-24 LAB — CBC
HCT: 27.3 % — ABNORMAL LOW (ref 36.0–46.0)
Hemoglobin: 8.8 g/dL — ABNORMAL LOW (ref 12.0–15.0)
MCH: 27.4 pg (ref 26.0–34.0)
MCHC: 32.2 g/dL (ref 30.0–36.0)
MCV: 85 fL (ref 78.0–100.0)
PLATELETS: 145 10*3/uL — AB (ref 150–400)
RBC: 3.21 MIL/uL — ABNORMAL LOW (ref 3.87–5.11)
RDW: 17.3 % — AB (ref 11.5–15.5)
WBC: 10.9 10*3/uL — AB (ref 4.0–10.5)

## 2015-11-24 LAB — MAGNESIUM: Magnesium: 1.3 mg/dL — ABNORMAL LOW (ref 1.7–2.4)

## 2015-11-24 MED ORDER — MAGNESIUM SULFATE 4 GM/100ML IV SOLN
4.0000 g | Freq: Once | INTRAVENOUS | Status: AC
  Administered 2015-11-24: 4 g via INTRAVENOUS
  Filled 2015-11-24: qty 100

## 2015-11-24 MED ORDER — STERILE WATER FOR INJECTION IV SOLN
INTRAVENOUS | Status: DC
  Filled 2015-11-24 (×2): qty 850

## 2015-11-24 MED ORDER — MAGNESIUM OXIDE 400 (241.3 MG) MG PO TABS
400.0000 mg | ORAL_TABLET | Freq: Two times a day (BID) | ORAL | Status: DC
  Administered 2015-11-24 – 2015-12-02 (×17): 400 mg via ORAL
  Filled 2015-11-24 (×17): qty 1

## 2015-11-24 MED ORDER — SODIUM BICARBONATE 650 MG PO TABS
650.0000 mg | ORAL_TABLET | Freq: Three times a day (TID) | ORAL | Status: DC
  Administered 2015-11-24 – 2015-11-28 (×12): 650 mg via ORAL
  Filled 2015-11-24 (×12): qty 1

## 2015-11-24 MED ORDER — LORAZEPAM 0.5 MG PO TABS
0.5000 mg | ORAL_TABLET | Freq: Three times a day (TID) | ORAL | Status: DC | PRN
  Administered 2015-11-25 – 2015-12-02 (×22): 0.5 mg via ORAL
  Filled 2015-11-24 (×23): qty 1

## 2015-11-24 NOTE — Progress Notes (Signed)
Pharmacy Antibiotic Note  Margretta Dittyngela N Dicarlo is a 30 y.o. female with MSSA and enterobacter bacteremia and endocarditis on unasyn and ceftriaxone. TEE on 8/21 revealed 2 large, very mobile vegetations on tricuspid valves.   Plan: Continue unasyn 3gm IV q6h Continue ceftriaxone 2g IV q12h Will follow renal function, cultures and clinical progress  Height: 5\' 4"  (162.6 cm) Weight: 110 lb 7.2 oz (50.1 kg) IBW/kg (Calculated) : 54.7  Temp (24hrs), Avg:98.1 F (36.7 C), Min:97.5 F (36.4 C), Max:98.4 F (36.9 C)   Recent Labs Lab 11/17/15 1632  11/18/15 1055 11/19/15 1029 11/20/15 0334 11/21/15 1127 11/22/15 0238 11/23/15 0257 11/24/15 0251  WBC  --   --  8.9 9.0  --   --  7.9 11.0* 10.9*  CREATININE  --   < >  --  3.98* 3.15* 1.92* 1.68* 1.23* 1.08*  LATICACIDVEN 1.6  --   --   --   --   --   --   --   --   < > = values in this interval not displayed.  Estimated Creatinine Clearance: 61.3 mL/min (by C-G formula based on SCr of 1.08 mg/dL).    No Known Allergies  Antimicrobials this admission: Vanc 8/15 >>8/17 Zosyn 8/15 >>8/17 Unasyn 8/17>> Ceftriaxone 8/21>>  Microbiology results: 8/15  UCx: mult species, suggest recollect 8/15  BCx: MSSA 8/15  [employer] (+)enterococcus sp, Staph sp, MRSA neg 8/16  MRSA PCR (+) 8/16  BCx: Staph aureus  (MSSA) 8/17 BCx: Staph aureus, enterococcus f (pan-sens) 8/18 BCx: ng   8/21 BCx: GPC in clusters   Arlean Hoppingorey M. Newman PiesBall, PharmD, BCPS Clinical Pharmacist Pager [phone] 11/24/2015 10:54 AM

## 2015-11-24 NOTE — Progress Notes (Signed)
Patient ID: Catherine DittyAngela N Hernandez, female   DOB: 1985-09-20, 30 y.o.   MRN: [id number]          [facility]    Date of Admission:  11/16/2015   Total days of antibiotics 8        Day 7 ampicillin sulbactam        Day 3 ceftriaxone Principal Problem:   Endocarditis of native valve Active Problems:   Sepsis (HCC)   CAP (community acquired pneumonia)   Enterococcal bacteremia   Staphylococcus aureus bacteremia   Polysubstance dependence including opioid type drug with complication, continuous use (HCC)   Opioid use with withdrawal (HCC)   Hyponatremia   Acute renal failure (HCC)   Hypocalcemia   Nonspecific abnormal electrocardiogram (ECG) (EKG)   Thrombocytopenia (HCC)   Normocytic anemia   Protein-calorie malnutrition (HCC)   Poor dentition   AKI (acute kidney injury) (HCC)   Malnutrition of moderate degree   Polysubstance abuse   . ampicillin-sulbactam (UNASYN) IV  3 g Intravenous Q6H  . cefTRIAXone (ROCEPHIN)  IV  2 g Intravenous Q12H  . feeding supplement (NEPRO CARB STEADY)  237 mL Oral BID BM  . folic acid  1 mg Oral Daily  . magnesium oxide  400 mg Oral BID  . multivitamin with minerals  1 tablet Oral Daily  . nicotine  14 mg Transdermal Daily  . predniSONE  40 mg Oral Q breakfast  . sodium bicarbonate  650 mg Oral TID  . sodium chloride flush  3 mL Intravenous Q12H  . thiamine  100 mg Oral Daily    SUBJECTIVE: She is feeling better but did not sleep well last night because of burning related to her magnesium infusion and her peripheral IV. Her chest pain and shortness of breath have resolved.  Review of Systems: Review of Systems  Constitutional: Negative for chills, diaphoresis, fever, malaise/fatigue and weight loss.  HENT: Negative for sore throat.   Respiratory: Negative for cough, sputum production and shortness of breath.   Cardiovascular: Negative for chest pain.  Gastrointestinal: Negative for abdominal pain, diarrhea, nausea and  vomiting.  Genitourinary: Negative for dysuria.  Musculoskeletal: Negative for joint pain and myalgias.  Skin: Negative for rash.  Neurological: Negative for headaches.  Psychiatric/Behavioral: Positive for substance abuse.    Past Medical History:  Diagnosis Date  . CAP (community acquired pneumonia) 11/16/2015  . Childhood asthma   . Heroin abuse   . Pneumonia 2007  . Polysubstance abuse     Social History  Substance Use Topics  . Smoking status: Current Every Day Smoker    Packs/day: 0.50    Years: 10.00    Types: Cigarettes  . Smokeless tobacco: Never Used  . Alcohol use No    Family History  Problem Relation Age of Onset  . Anesthesia problems Neg Hx   . Hypotension Neg Hx   . Malignant hyperthermia Neg Hx   . Pseudochol deficiency Neg Hx    No Known Allergies  OBJECTIVE: Vitals:   11/23/15 1900 11/23/15 1950 11/23/15 2035 11/24/15 0500  BP: 116/73 129/83 118/70 116/76  Pulse:   (!) 58 (!) 48  Resp:  20 20 20   Temp:  98.4 F (36.9 C) 97.5 F (36.4 C) 98.2 F (36.8 C)  TempSrc:  Oral Oral Oral  SpO2:  100% 99% 98%  Weight:   109 lb 14.4 oz (49.9 kg) 110 lb 7.2 oz (50.1 kg)  Height:       Body  mass index is 18.96 kg/m.  Physical Exam  Constitutional: She is oriented to person, place, and time.  She is comfortable.   HENT:  Mouth/Throat: No oropharyngeal exudate.  Cardiovascular: Normal rate and regular rhythm.   No murmur heard. Pulmonary/Chest: Effort normal and breath sounds normal.  Abdominal: Soft. There is no tenderness.  Musculoskeletal: Normal range of motion. She exhibits no edema or tenderness.  Neurological: She is alert and oriented to person, place, and time.  Skin: No rash noted.  Psychiatric: Mood and affect normal.    Lab Results Lab Results  Component Value Date   WBC 10.9 (H) 11/24/2015   HGB 8.8 (L) 11/24/2015   HCT 27.3 (L) 11/24/2015   MCV 85.0 11/24/2015   PLT 145 (L) 11/24/2015    Lab Results  Component Value Date    CREATININE 1.08 (H) 11/24/2015   BUN 23 (H) 11/24/2015   NA 140 11/24/2015   K 4.3 11/24/2015   CL 113 (H) 11/24/2015   CO2 16 (L) 11/24/2015    Lab Results  Component Value Date   ALT 11 (L) 11/18/2015   AST 23 11/18/2015   ALKPHOS 50 11/18/2015   BILITOT 0.5 11/18/2015     Microbiology: Recent Results (from the past 240 hour(s))  Blood Culture (routine x 2)     Status: Abnormal   Collection Time: 11/16/15  8:30 PM  Result Value Ref Range Status   Specimen Description BLOOD RIGHT ARM  Final   Special Requests BOTTLES DRAWN AEROBIC AND ANAEROBIC 5CC  Final   Culture  Setup Time   Final    GRAM POSITIVE COCCI IN CLUSTERS IN BOTH AEROBIC AND ANAEROBIC BOTTLES CRITICAL RESULT CALLED TO, READ BACK BY AND VERIFIED WITH: L POWELL PHARMD 1800 11/17/15 A BROWNING    Culture (A)  Final    STAPHYLOCOCCUS AUREUS ENTEROCOCCUS FAECALIS SUSCEPTIBILITIES PERFORMED ON PREVIOUS CULTURE WITHIN THE LAST 5 DAYS. [facility]     Report Status 11/21/2015 FINAL  Final   Organism ID, Bacteria STAPHYLOCOCCUS AUREUS  Final      Susceptibility   Staphylococcus aureus - MIC*    CIPROFLOXACIN <=0.5 SENSITIVE Sensitive     ERYTHROMYCIN <=0.25 SENSITIVE Sensitive     GENTAMICIN <=0.5 SENSITIVE Sensitive     OXACILLIN 0.5 SENSITIVE Sensitive     TETRACYCLINE <=1 SENSITIVE Sensitive     VANCOMYCIN <=0.5 SENSITIVE Sensitive     TRIMETH/SULFA <=10 SENSITIVE Sensitive     CLINDAMYCIN <=0.25 SENSITIVE Sensitive     RIFAMPIN <=0.5 SENSITIVE Sensitive     Inducible Clindamycin NEGATIVE Sensitive     * STAPHYLOCOCCUS AUREUS  Blood Culture ID Panel (Reflexed)     Status: Abnormal   Collection Time: 11/16/15  8:30 PM  Result Value Ref Range Status   Enterococcus species DETECTED (A) NOT DETECTED Final    Comment: CRITICAL RESULT CALLED TO, READ BACK BY AND VERIFIED WITH: L POWELL PHARMD 1800 11/17/15 A BROWNING    Vancomycin resistance NOT DETECTED NOT DETECTED Final   Listeria  monocytogenes NOT DETECTED NOT DETECTED Final   Staphylococcus species DETECTED (A) NOT DETECTED Final    Comment: CRITICAL RESULT CALLED TO, READ BACK BY AND VERIFIED WITH: L POWELL PHARMD 1800 11/17/15 A BROWNING    Staphylococcus aureus DETECTED (A) NOT DETECTED Final    Comment: CRITICAL RESULT CALLED TO, READ BACK BY AND VERIFIED WITH: L POWELL PHARMD 1800 11/17/15 A BROWNING    Methicillin resistance NOT DETECTED NOT DETECTED Final   Streptococcus species  NOT DETECTED NOT DETECTED Final   Streptococcus agalactiae NOT DETECTED NOT DETECTED Final   Streptococcus pneumoniae NOT DETECTED NOT DETECTED Final   Streptococcus pyogenes NOT DETECTED NOT DETECTED Final   Acinetobacter baumannii NOT DETECTED NOT DETECTED Final   Enterobacteriaceae species NOT DETECTED NOT DETECTED Final   Enterobacter cloacae complex NOT DETECTED NOT DETECTED Final   Escherichia coli NOT DETECTED NOT DETECTED Final   Klebsiella oxytoca NOT DETECTED NOT DETECTED Final   Klebsiella pneumoniae NOT DETECTED NOT DETECTED Final   Proteus species NOT DETECTED NOT DETECTED Final   Serratia marcescens NOT DETECTED NOT DETECTED Final   Carbapenem resistance NOT DETECTED NOT DETECTED Final   Haemophilus influenzae NOT DETECTED NOT DETECTED Final   Neisseria meningitidis NOT DETECTED NOT DETECTED Final   Pseudomonas aeruginosa NOT DETECTED NOT DETECTED Final   Candida albicans NOT DETECTED NOT DETECTED Final   Candida glabrata NOT DETECTED NOT DETECTED Final   Candida krusei NOT DETECTED NOT DETECTED Final   Candida parapsilosis NOT DETECTED NOT DETECTED Final   Candida tropicalis NOT DETECTED NOT DETECTED Final    Comment: Performed at [facility]  Urine culture     Status: Abnormal   Collection Time: 11/16/15  8:50 PM  Result Value Ref Range Status   Specimen Description URINE, RANDOM  Final   Special Requests NONE  Final   Culture MULTIPLE SPECIES PRESENT, SUGGEST RECOLLECTION (A)  Final   Report Status  11/18/2015 FINAL  Final  Blood Culture (routine x 2)     Status: Abnormal   Collection Time: 11/16/15  9:30 PM  Result Value Ref Range Status   Specimen Description BLOOD LEFT FOREARM  Final   Special Requests BOTTLES DRAWN AEROBIC AND ANAEROBIC 5CC  Final   Culture  Setup Time   Final    GRAM POSITIVE COCCI IN CLUSTERS IN BOTH AEROBIC AND ANAEROBIC BOTTLES CRITICAL RESULT CALLED TO, READ BACK BY AND VERIFIED WITH: L POWELL PHARMD 1800 11/17/15 A BROWNING    Culture (A)  Final    STAPHYLOCOCCUS AUREUS SUSCEPTIBILITIES PERFORMED ON PREVIOUS CULTURE WITHIN THE LAST 5 DAYS. Performed at [facility]    Report Status 11/20/2015 FINAL  Final  MRSA PCR Screening     Status: Abnormal   Collection Time: 11/17/15 12:45 AM  Result Value Ref Range Status   MRSA by PCR POSITIVE (A) NEGATIVE Final    Comment:        The GeneXpert MRSA Assay (FDA approved for NASAL specimens only), is one component of a comprehensive MRSA colonization surveillance program. It is not intended to diagnose MRSA infection nor to guide or monitor treatment for MRSA infections. RESULT CALLED TO, READ BACK BY AND VERIFIED WITH: K ABDUSSURAN,RN @0440  11/17/15 MKELLY   Culture, blood (x 2)     Status: Abnormal   Collection Time: 11/17/15  2:35 AM  Result Value Ref Range Status   Specimen Description BLOOD BLOOD LEFT HAND  Final   Special Requests IN PEDIATRIC BOTTLE 1CC  Final   Culture  Setup Time   Final    GRAM POSITIVE COCCI IN CLUSTERS AEROBIC BOTTLE ONLY CRITICAL VALUE NOTED.  VALUE IS CONSISTENT WITH PREVIOUSLY REPORTED AND CALLED VALUE.    Culture (A)  Final    STAPHYLOCOCCUS AUREUS SUSCEPTIBILITIES PERFORMED ON PREVIOUS CULTURE WITHIN THE LAST 5 DAYS.    Report Status 11/20/2015 FINAL  Final  Culture, blood (x 2)     Status: Abnormal   Collection Time: 11/17/15  2:45 AM  Result Value Ref Range Status   Specimen Description BLOOD BLOOD RIGHT HAND  Final   Special Requests IN PEDIATRIC  BOTTLE 2CC  Final   Culture  Setup Time   Final    GRAM POSITIVE COCCI IN CLUSTERS AEROBIC BOTTLE ONLY CRITICAL RESULT CALLED TO, READ BACK BY AND VERIFIED WITH: L POWELL PHARMD 1800 11/17/15 A BROWNING    Culture (A)  Final    STAPHYLOCOCCUS AUREUS SUSCEPTIBILITIES PERFORMED ON PREVIOUS CULTURE WITHIN THE LAST 5 DAYS.    Report Status 11/20/2015 FINAL  Final  Culture, blood (Routine X 2) w Reflex to ID Panel     Status: Abnormal   Collection Time: 11/18/15  7:10 AM  Result Value Ref Range Status   Specimen Description BLOOD RIGHT HAND  Final   Special Requests IN PEDIATRIC BOTTLE 3CC  Final   Culture  Setup Time   Final    GRAM POSITIVE COCCI IN CLUSTERS AEROBIC BOTTLE ONLY CRITICAL RESULT CALLED TO, READ BACK BY AND VERIFIED WITH: G ABBOTT PHARMD 2302 11/18/15 A BROWNING    Culture (A)  Final    STAPHYLOCOCCUS AUREUS SUSCEPTIBILITIES PERFORMED ON PREVIOUS CULTURE WITHIN THE LAST 5 DAYS. ENTEROCOCCUS FAECALIS    Report Status 11/21/2015 FINAL  Final   Organism ID, Bacteria ENTEROCOCCUS FAECALIS  Final      Susceptibility   Enterococcus faecalis - MIC*    AMPICILLIN <=2 SENSITIVE Sensitive     VANCOMYCIN 2 SENSITIVE Sensitive     GENTAMICIN SYNERGY SENSITIVE Sensitive     * ENTEROCOCCUS FAECALIS  Culture, blood (Routine X 2) w Reflex to ID Panel     Status: Abnormal   Collection Time: 11/18/15  8:41 AM  Result Value Ref Range Status   Specimen Description BLOOD LEFT ARM  Final   Special Requests IN PEDIATRIC BOTTLE 3CC  Final   Culture  Setup Time   Final    GRAM POSITIVE COCCI IN CLUSTERS IN PEDIATRIC BOTTLE CRITICAL RESULT CALLED TO, READ BACK BY AND VERIFIED WITH: G ABBOTT PHARMD 2302 11/18/15 A BROWNING    Culture (A)  Final    STAPHYLOCOCCUS AUREUS SUSCEPTIBILITIES PERFORMED ON PREVIOUS CULTURE WITHIN THE LAST 5 DAYS.    Report Status 11/20/2015 FINAL  Final  Culture, blood (routine x 2)     Status: None   Collection Time: 11/19/15  1:25 PM  Result Value Ref Range  Status   Specimen Description BLOOD RIGHT HAND  Final   Special Requests IN PEDIATRIC BOTTLE  Final   Culture NO GROWTH 5 DAYS  Final   Report Status 11/24/2015 FINAL  Final  Culture, blood (routine x 2)     Status: None   Collection Time: 11/19/15  1:28 PM  Result Value Ref Range Status   Specimen Description BLOOD LEFT HAND  Final   Special Requests IN PEDIATRIC BOTTLE  Final   Culture NO GROWTH 5 DAYS  Final   Report Status 11/24/2015 FINAL  Final  Urine culture     Status: Abnormal   Collection Time: 11/22/15  9:41 PM  Result Value Ref Range Status   Specimen Description URINE, RANDOM  Final   Special Requests NONE  Final   Culture <10,000 COLONIES/mL INSIGNIFICANT GROWTH (A)  Final   Report Status 11/24/2015 FINAL  Final  Culture, blood (Routine X 2) w Reflex to ID Panel     Status: None (Preliminary result)   Collection Time: 11/22/15 10:20 PM  Result Value Ref Range Status   Specimen Description  BLOOD RIGHT FOREARM  Final   Special Requests IN PEDIATRIC BOTTLE 1CC  Final   Culture NO GROWTH 2 DAYS  Final   Report Status PENDING  Incomplete  Culture, blood (Routine X 2) w Reflex to ID Panel     Status: None (Preliminary result)   Collection Time: 11/22/15 10:39 PM  Result Value Ref Range Status   Specimen Description BLOOD LEFT HAND  Final   Special Requests IN PEDIATRIC BOTTLE 0.5CC  Final   Culture  Setup Time   Final    GRAM POSITIVE COCCI IN CLUSTERS AEROBIC BOTTLE ONLY CRITICAL VALUE NOTED.  VALUE IS CONSISTENT WITH PREVIOUSLY REPORTED AND CALLED VALUE.    Culture TOO YOUNG TO READ  Final   Report Status PENDING  Incomplete   Transesophageal echocardiogram 11/23/2015  There are 2 large shaggy and very mobile vegetations on both leaflets of the TV.  The largest vegetation measures 2.63 x 1.2cm in diameter and prolapses into the RV.  The other vegetation measures .98x1.6cm in diameter.  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 She is amenable to going to a skilled nursing facility where she can have supervised IV antibiotic therapy.  PLAN: 1. Continue ampicillin sulbactam and ceftriaxone 2. Recommend case manager to start discharge planning 3. Once SNF is arranged place PICC (or sooner if peripheral access is lost)  Cliffton Asters, MD [facility] [phone] pager   [phone] cell 11/24/2015, 2:18 PM

## 2015-11-24 NOTE — Progress Notes (Signed)
Called into pt room after complaint of pain/burning at IV site. Pain complained of at right forearm IV which had magnesium sulfate at 50 cc/hr. R arm with no signs of infiltration - color and temperature appropriate. Flushed IV site with 10cc NS. Restarted magnesium sulfate at 25 cc/hr. Pt denies pain or burning at this rate. Will continue to monitor.   Leonidas Rombergaitlin S [place], RN

## 2015-11-24 NOTE — Progress Notes (Signed)
PROGRESS NOTE    Catherine Hernandez  ZOX:[id number] DOB: 1985-07-03 DOA: 11/16/2015 PCP: No PCP Per Patient    Brief Narrative:  30 year old female with a history of polysubstance abuse (cocaine, heroin, tobacco, marijuana), asthma presented with increasing shortness of breath and coughing. Her symptoms were associated with diffuse myalgias and poor oral intake. Apparently, the patient's last heroin and cocaine use was approximately [facility] priorto admission. Upon presentation. The patient was noted to be febrile with a fever up to 101.29F, hypertension, and tachycardia. Code sepsis was activated, and the patient was started on intravenous fluids and intravenous antibiotics.The patient was found to have MRSA and Enterococcus faecalis bacteremia. ID was consulted to help manage antibiotics. The patient had tricuspid valve vegetation on TTE. TEE done on 11/23/15--see below.  Assessment & Plan:   Principal Problem:   Endocarditis of native valve Active Problems:   Sepsis (HCC)   CAP (community acquired pneumonia)   Polysubstance dependence including opioid type drug with complication, continuous use (HCC)   Opioid use with withdrawal (HCC)   Hyponatremia   Acute renal failure (HCC)   Hypocalcemia   Nonspecific abnormal electrocardiogram (ECG) (EKG)   Thrombocytopenia (HCC)   Enterococcal bacteremia   Staphylococcus aureus bacteremia   Normocytic anemia   Protein-calorie malnutrition (HCC)   Poor dentition   AKI (acute kidney injury) (HCC)   Malnutrition of moderate degree   Polysubstance abuse  Sepsis -due to bacteremia with metastatic infection -patient has pulmonary infiltrates which representsmetastatic infection from bacteremia -Saline lockIV fluids--sepsis physiology resolved -Continue Unasyn and IV Rocephin -lactic acid 2.16-->0.99 -procalcitonin 18.52 -11/22/15--new fever-->repeat blood culture, UA--neg to date 11/22/15-repeat CXR--interstitial prominence-->lasix IV x  1 -11/23/15 TEE--2 large shaggy and very mobile vegetations on both leaflets of the TV -wean stress steroids to prednisone -: Continue empiric IV antibiotics.  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 Patient has been assessed by cardiothoracic surgery, who feel no indication for dry scalp stated 5 and a repair at this time. Is felt patient's currently not a surgical candidate. Continue empiric IV antibiotics and will need follow-up 2-D echo towards the end of IV antibiotic course. ID following.  Pulmonary infiltrates/Septic emboli -V/Q scan--indeterminate -CT chest without contrast--multifocal nodular consolidations with cavitations likely septic emboli; RLL consolidation/wedge-shaped infarct -hemoptysis improved. Continue IV antibiotics.  Acute kidney injury -secondary to sepsis and hemodynamic changes -04/09/2013 serum creatinine 1.01 -11/16/2015 serum creatinine 4.43 -Renal ultrasound--bilateral echogenic kidneys; no hydronephrosis -UA with pyuria and proteinuria -check urine protein creatinine ratio--1.24 -improving  Hyponatremia -likely volume depletion -improved with IVF  Thrombocytopenia -due to sepsis -HIV antibody--neg -hep B and C--negative -improving with tx of infection  Hypokalemia/hypomagnesemia. -repleted potassium. Keep magnesium greater than 2. Magnesium today is 1.3. Replete magnesium.  Hypocalcemia -improved 25 vitamin D--20.4-->supplement  Hemoptysis -INR 1.45 -PTT 40 -trend  Hgb-->drop in Hgb likely dilution--no other obvious source of blood loss -improved  Anemia of chronic disease -drop likely dilution -11/22/15--transfused 2 units PRBC     DVT prophylaxis: scd Code Status: Full Family Communication: Updated patient and sister at bedside. Disposition Plan: Skilled nursing facility when medically stable and PICC line has been  placed.   Consultants:   Infectious diseases: Dr. Orvan Falconer 11/18/2015  CT surgery: Dr. Donata Clay 11/23/2015  Cardiology for TEE.  Procedures:   TEE 11/23/2015  Chest x-ray 11/16/2015, 11/19/2015, 11/22/2015  2 d echo 11/18/2015  Renal ultrasound 11/17/2015   CT Chest 11/17/2015  VQ scan 11/17/2015   Transfuse 2 units packed red blood cells a 21 2017      Antimicrobials:   IV Unasyn 11/18/2015  IV Rocephin 11/22/2015  IV Zosyn 11/16/2015  IV Vancomycin 11/16/2015>>>>>11/18/2015   Subjective: Patient c/o burning at magnesium IV site. No SOB. No CP.  Objective: Vitals:   11/23/15 1900 11/23/15 1950 11/23/15 2035 11/24/15 0500  BP: 116/73 129/83 118/70 116/76  Pulse:   (!) 58 (!) 48  Resp:  20 20 20   Temp:  98.4 F (36.9 C) 97.5 F (36.4 C) 98.2 F (36.8 C)  TempSrc:  Oral Oral Oral  SpO2:  100% 99% 98%  Weight:   49.9 kg (109 lb 14.4 oz) 50.1 kg (110 lb 7.2 oz)  Height:        Intake/Output Summary (Last 24 hours) at 11/24/15 1126 Last data filed at 11/24/15 0845  Gross per 24 hour  Intake              660 ml  Output                0 ml  Net              660 ml   Filed Weights   11/23/15 1051 11/23/15 2035 11/24/15 0500  Weight: 51 kg (112 lb 7 oz) 49.9 kg (109 lb 14.4 oz) 50.1 kg (110 lb 7.2 oz)    Examination:  General exam: Appears calm and comfortable. Respiratory system: Clear to auscultation. Respiratory effort normal. Cardiovascular system: S1 & S2 heard, RRR. No JVD, murmurs, rubs, gallops or clicks. No pedal edema. Gastrointestinal system: Abdomen is nondistended, soft and  nontender. No organomegaly or masses felt. Normal bowel sounds heard. Central nervous system: Alert and oriented. No focal neurological deficits. Extremities: Symmetric 5 x 5 power. Skin: No rashes, lesions or ulcers Psychiatry: Judgement and insight appear normal. Mood & affect appropriate.     Data Reviewed: I have personally reviewed following labs and imaging studies  CBC:  Recent Labs Lab 11/18/15 1055 11/19/15 1029 11/22/15 0238 11/22/15 2238 11/23/15 0257 11/24/15 0251  WBC 8.9 9.0 7.9  --  11.0* 10.9*  HGB 8.5* 8.4* 6.8* 9.1* 8.9* 8.8*  HCT 25.2* 26.4* 22.0* 27.5* 27.1* 27.3*  MCV 78.3 80.7 83.3  --  83.4 85.0  PLT 44* 74* 152  --  152 145*   Basic Metabolic Panel:  Recent Labs Lab 11/20/15 0334 11/21/15 1127 11/22/15 0238 11/23/15 0257 11/24/15 0251  NA 135 137 139 139 140  K 4.8 4.0 3.1* 3.3* 4.3  CL 110 114* 115* 113* 113*  CO2 17* 15* 19* 19* 16*  GLUCOSE 170* 148* 100* 139* 174*  BUN 63* 46* 37* 21* 23*  CREATININE 3.15* 1.92* 1.68* 1.23* 1.08*  CALCIUM 7.5* 8.0* 7.4* 7.0* 6.7*  MG 2.3  --   --  1.3* 1.3*   GFR: Estimated Creatinine Clearance: 61.3 mL/min (by C-G formula based on SCr of 1.08 mg/dL). Liver Function Tests:  Recent Labs Lab 11/18/15 0840  AST 23  ALT 11*  ALKPHOS 50  BILITOT 0.5  PROT 5.1*  ALBUMIN 1.4*   No results for input(s): LIPASE, AMYLASE in the last 168 hours. No results  for input(s): AMMONIA in the last 168 hours. Coagulation Profile: No results for input(s): INR, PROTIME in the last 168 hours. Cardiac Enzymes:  Recent Labs Lab 11/17/15 1536  TROPONINI <0.03   BNP (last 3 results) No results for input(s): PROBNP in the last 8760 hours. HbA1C: No results for input(s): HGBA1C in the last 72 hours. CBG: No results for input(s): GLUCAP in the last 168 hours. Lipid Profile: No results for input(s): CHOL, HDL, LDLCALC, TRIG, CHOLHDL, LDLDIRECT in the last 72 hours. Thyroid Function Tests: No results for  input(s): TSH, T4TOTAL, FREET4, T3FREE, THYROIDAB in the last 72 hours. Anemia Panel: No results for input(s): VITAMINB12, FOLATE, FERRITIN, TIBC, IRON, RETICCTPCT in the last 72 hours. Sepsis Labs:  Recent Labs Lab 11/17/15 1632  LATICACIDVEN 1.6    Recent Results (from the past 240 hour(s))  Blood Culture (routine x 2)     Status: Abnormal   Collection Time: 11/16/15  8:30 PM  Result Value Ref Range Status   Specimen Description BLOOD RIGHT ARM  Final   Special Requests BOTTLES DRAWN AEROBIC AND ANAEROBIC 5CC  Final   Culture  Setup Time   Final    GRAM POSITIVE COCCI IN CLUSTERS IN BOTH AEROBIC AND ANAEROBIC BOTTLES CRITICAL RESULT CALLED TO, READ BACK BY AND VERIFIED WITH: L POWELL PHARMD 1800 11/17/15 A BROWNING    Culture (A)  Final    STAPHYLOCOCCUS AUREUS ENTEROCOCCUS FAECALIS SUSCEPTIBILITIES PERFORMED ON PREVIOUS CULTURE WITHIN THE LAST 5 DAYS. [facility]     Report Status 11/21/2015 FINAL  Final   Organism ID, Bacteria STAPHYLOCOCCUS AUREUS  Final      Susceptibility   Staphylococcus aureus - MIC*    CIPROFLOXACIN <=0.5 SENSITIVE Sensitive     ERYTHROMYCIN <=0.25 SENSITIVE Sensitive     GENTAMICIN <=0.5 SENSITIVE Sensitive     OXACILLIN 0.5 SENSITIVE Sensitive     TETRACYCLINE <=1 SENSITIVE Sensitive     VANCOMYCIN <=0.5 SENSITIVE Sensitive     TRIMETH/SULFA <=10 SENSITIVE Sensitive     CLINDAMYCIN <=0.25 SENSITIVE Sensitive     RIFAMPIN <=0.5 SENSITIVE Sensitive     Inducible Clindamycin NEGATIVE Sensitive     * STAPHYLOCOCCUS AUREUS  Blood Culture ID Panel (Reflexed)     Status: Abnormal   Collection Time: 11/16/15  8:30 PM  Result Value Ref Range Status   Enterococcus species DETECTED (A) NOT DETECTED Final    Comment: CRITICAL RESULT CALLED TO, READ BACK BY AND VERIFIED WITH: L POWELL PHARMD 1800 11/17/15 A BROWNING    Vancomycin resistance NOT DETECTED NOT DETECTED Final   Listeria monocytogenes NOT DETECTED NOT DETECTED Final    Staphylococcus species DETECTED (A) NOT DETECTED Final    Comment: CRITICAL RESULT CALLED TO, READ BACK BY AND VERIFIED WITH: L POWELL PHARMD 1800 11/17/15 A BROWNING    Staphylococcus aureus DETECTED (A) NOT DETECTED Final    Comment: CRITICAL RESULT CALLED TO, READ BACK BY AND VERIFIED WITH: L POWELL PHARMD 1800 11/17/15 A BROWNING    Methicillin resistance NOT DETECTED NOT DETECTED Final   Streptococcus species NOT DETECTED NOT DETECTED Final   Streptococcus agalactiae NOT DETECTED NOT DETECTED Final   Streptococcus pneumoniae NOT DETECTED NOT DETECTED Final   Streptococcus pyogenes NOT DETECTED NOT DETECTED Final   Acinetobacter baumannii NOT DETECTED NOT DETECTED Final   Enterobacteriaceae species NOT DETECTED NOT DETECTED Final   Enterobacter cloacae complex NOT DETECTED NOT DETECTED Final   Escherichia coli NOT DETECTED NOT DETECTED Final   Klebsiella oxytoca NOT  DETECTED NOT DETECTED Final   Klebsiella pneumoniae NOT DETECTED NOT DETECTED Final   Proteus species NOT DETECTED NOT DETECTED Final   Serratia marcescens NOT DETECTED NOT DETECTED Final   Carbapenem resistance NOT DETECTED NOT DETECTED Final   Haemophilus influenzae NOT DETECTED NOT DETECTED Final   Neisseria meningitidis NOT DETECTED NOT DETECTED Final   Pseudomonas aeruginosa NOT DETECTED NOT DETECTED Final   Candida albicans NOT DETECTED NOT DETECTED Final   Candida glabrata NOT DETECTED NOT DETECTED Final   Candida krusei NOT DETECTED NOT DETECTED Final   Candida parapsilosis NOT DETECTED NOT DETECTED Final   Candida tropicalis NOT DETECTED NOT DETECTED Final    Comment: Performed at [facility]  Urine culture     Status: Abnormal   Collection Time: 11/16/15  8:50 PM  Result Value Ref Range Status   Specimen Description URINE, RANDOM  Final   Special Requests NONE  Final   Culture MULTIPLE SPECIES PRESENT, SUGGEST RECOLLECTION (A)  Final   Report Status 11/18/2015 FINAL  Final  Blood Culture (routine  x 2)     Status: Abnormal   Collection Time: 11/16/15  9:30 PM  Result Value Ref Range Status   Specimen Description BLOOD LEFT FOREARM  Final   Special Requests BOTTLES DRAWN AEROBIC AND ANAEROBIC 5CC  Final   Culture  Setup Time   Final    GRAM POSITIVE COCCI IN CLUSTERS IN BOTH AEROBIC AND ANAEROBIC BOTTLES CRITICAL RESULT CALLED TO, READ BACK BY AND VERIFIED WITH: L POWELL PHARMD 1800 11/17/15 A BROWNING    Culture (A)  Final    STAPHYLOCOCCUS AUREUS SUSCEPTIBILITIES PERFORMED ON PREVIOUS CULTURE WITHIN THE LAST 5 DAYS. Performed at [facility]    Report Status 11/20/2015 FINAL  Final  MRSA PCR Screening     Status: Abnormal   Collection Time: 11/17/15 12:45 AM  Result Value Ref Range Status   MRSA by PCR POSITIVE (A) NEGATIVE Final    Comment:        The GeneXpert MRSA Assay (FDA approved for NASAL specimens only), is one component of a comprehensive MRSA colonization surveillance program. It is not intended to diagnose MRSA infection nor to guide or monitor treatment for MRSA infections. RESULT CALLED TO, READ BACK BY AND VERIFIED WITH: K ABDUSSURAN,RN @0440  11/17/15 MKELLY   Culture, blood (x 2)     Status: Abnormal   Collection Time: 11/17/15  2:35 AM  Result Value Ref Range Status   Specimen Description BLOOD BLOOD LEFT HAND  Final   Special Requests IN PEDIATRIC BOTTLE 1CC  Final   Culture  Setup Time   Final    GRAM POSITIVE COCCI IN CLUSTERS AEROBIC BOTTLE ONLY CRITICAL VALUE NOTED.  VALUE IS CONSISTENT WITH PREVIOUSLY REPORTED AND CALLED VALUE.    Culture (A)  Final    STAPHYLOCOCCUS AUREUS SUSCEPTIBILITIES PERFORMED ON PREVIOUS CULTURE WITHIN THE LAST 5 DAYS.    Report Status 11/20/2015 FINAL  Final  Culture, blood (x 2)     Status: Abnormal   Collection Time: 11/17/15  2:45 AM  Result Value Ref Range Status   Specimen Description BLOOD BLOOD RIGHT HAND  Final   Special Requests IN PEDIATRIC BOTTLE 2CC  Final   Culture  Setup Time   Final     GRAM POSITIVE COCCI IN CLUSTERS AEROBIC BOTTLE ONLY CRITICAL RESULT CALLED TO, READ BACK BY AND VERIFIED WITH: L POWELL PHARMD 1800 11/17/15 A BROWNING    Culture (A)  Final    STAPHYLOCOCCUS  AUREUS SUSCEPTIBILITIES PERFORMED ON PREVIOUS CULTURE WITHIN THE LAST 5 DAYS.    Report Status 11/20/2015 FINAL  Final  Culture, blood (Routine X 2) w Reflex to ID Panel     Status: Abnormal   Collection Time: 11/18/15  7:10 AM  Result Value Ref Range Status   Specimen Description BLOOD RIGHT HAND  Final   Special Requests IN PEDIATRIC BOTTLE 3CC  Final   Culture  Setup Time   Final    GRAM POSITIVE COCCI IN CLUSTERS AEROBIC BOTTLE ONLY CRITICAL RESULT CALLED TO, READ BACK BY AND VERIFIED WITH: G ABBOTT PHARMD 2302 11/18/15 A BROWNING    Culture (A)  Final    STAPHYLOCOCCUS AUREUS SUSCEPTIBILITIES PERFORMED ON PREVIOUS CULTURE WITHIN THE LAST 5 DAYS. ENTEROCOCCUS FAECALIS    Report Status 11/21/2015 FINAL  Final   Organism ID, Bacteria ENTEROCOCCUS FAECALIS  Final      Susceptibility   Enterococcus faecalis - MIC*    AMPICILLIN <=2 SENSITIVE Sensitive     VANCOMYCIN 2 SENSITIVE Sensitive     GENTAMICIN SYNERGY SENSITIVE Sensitive     * ENTEROCOCCUS FAECALIS  Culture, blood (Routine X 2) w Reflex to ID Panel     Status: Abnormal   Collection Time: 11/18/15  8:41 AM  Result Value Ref Range Status   Specimen Description BLOOD LEFT ARM  Final   Special Requests IN PEDIATRIC BOTTLE 3CC  Final   Culture  Setup Time   Final    GRAM POSITIVE COCCI IN CLUSTERS IN PEDIATRIC BOTTLE CRITICAL RESULT CALLED TO, READ BACK BY AND VERIFIED WITH: G ABBOTT PHARMD 2302 11/18/15 A BROWNING    Culture (A)  Final    STAPHYLOCOCCUS AUREUS SUSCEPTIBILITIES PERFORMED ON PREVIOUS CULTURE WITHIN THE LAST 5 DAYS.    Report Status 11/20/2015 FINAL  Final  Culture, blood (routine x 2)     Status: None (Preliminary result)   Collection Time: 11/19/15  1:25 PM  Result Value Ref Range Status   Specimen Description  BLOOD RIGHT HAND  Final   Special Requests IN PEDIATRIC BOTTLE 3ML  Final   Culture NO GROWTH 4 DAYS  Final   Report Status PENDING  Incomplete  Culture, blood (routine x 2)     Status: None (Preliminary result)   Collection Time: 11/19/15  1:28 PM  Result Value Ref Range Status   Specimen Description BLOOD LEFT HAND  Final   Special Requests IN PEDIATRIC BOTTLE 3ML  Final   Culture NO GROWTH 4 DAYS  Final   Report Status PENDING  Incomplete  Culture, blood (Routine X 2) w Reflex to ID Panel     Status: None (Preliminary result)   Collection Time: 11/22/15 10:20 PM  Result Value Ref Range Status   Specimen Description BLOOD RIGHT FOREARM  Final   Special Requests IN PEDIATRIC BOTTLE 1CC  Final   Culture NO GROWTH < 24 HOURS  Final   Report Status PENDING  Incomplete  Culture, blood (Routine X 2) w Reflex to ID Panel     Status: None (Preliminary result)   Collection Time: 11/22/15 10:39 PM  Result Value Ref Range Status   Specimen Description BLOOD LEFT HAND  Final   Special Requests IN PEDIATRIC BOTTLE 0.5CC  Final   Culture  Setup Time   Final    GRAM POSITIVE COCCI IN CLUSTERS AEROBIC BOTTLE ONLY CRITICAL VALUE NOTED.  VALUE IS CONSISTENT WITH PREVIOUSLY REPORTED AND CALLED VALUE.    Culture TOO YOUNG TO READ  Final  Report Status PENDING  Incomplete         Radiology Studies: Dg Chest Port 1 View  Result Date: 11/22/2015 CLINICAL DATA:  Shortness of Breath EXAM: PORTABLE CHEST 1 VIEW November 19, 2015 and chest CT November 17, 2015 FINDINGS: There has been slight partial clearing of consolidation from the right lower lobe. Patchy infiltrate in the left perihilar region remains. A somewhat larger focus of consolidation in the medial left base remains. The cavitary area in the left apex remains stable as does a somewhat ill-defined nodular opacity in the right upper lobe. No new areas of opacity are evident. Heart size and pulmonary vascular normal. No adenopathy. No bone  lesions evident. IMPRESSION: Slight clearing of infiltrate from the right lower lobe. Other areas of opacity/consolidation remain stable. There remains cavitation in a lesion in the left upper lobe. No new opacities are evident. Heart size within normal limits. Electronically Signed   By: Bretta Bang III M.D.   On: 11/22/2015 17:25        Scheduled Meds: . ampicillin-sulbactam (UNASYN) IV  3 g Intravenous Q6H  . cefTRIAXone (ROCEPHIN)  IV  2 g Intravenous Q12H  . feeding supplement (NEPRO CARB STEADY)  237 mL Oral BID BM  . folic acid  1 mg Oral Daily  . magnesium oxide  400 mg Oral BID  . multivitamin with minerals  1 tablet Oral Daily  . nicotine  14 mg Transdermal Daily  . predniSONE  40 mg Oral Q breakfast  . sodium bicarbonate  650 mg Oral TID  . sodium chloride flush  3 mL Intravenous Q12H  . thiamine  100 mg Oral Daily   Continuous Infusions:    LOS: 8 days    Time spent: 35 minutes    THOMPSON,DANIEL, MD [facility] Pager [phone]  If 7PM-7AM, please contact night-coverage [email or web address] Password [facility] 11/24/2015, 11:26 AM

## 2015-11-25 DIAGNOSIS — R0600 Dyspnea, unspecified: Secondary | ICD-10-CM

## 2015-11-25 DIAGNOSIS — F1910 Other psychoactive substance abuse, uncomplicated: Secondary | ICD-10-CM

## 2015-11-25 LAB — BASIC METABOLIC PANEL
ANION GAP: 7 (ref 5–15)
BUN: 13 mg/dL (ref 6–20)
CHLORIDE: 106 mmol/L (ref 101–111)
CO2: 23 mmol/L (ref 22–32)
Calcium: 7.3 mg/dL — ABNORMAL LOW (ref 8.9–10.3)
Creatinine, Ser: 0.77 mg/dL (ref 0.44–1.00)
GFR calc Af Amer: >60 mL/min (ref >60)
GLUCOSE: 84 mg/dL (ref 65–99)
POTASSIUM: 3.3 mmol/L — AB (ref 3.5–5.1)
Sodium: 136 mmol/L (ref 135–145)

## 2015-11-25 LAB — MAGNESIUM: Magnesium: 1.6 mg/dL — ABNORMAL LOW (ref 1.7–2.4)

## 2015-11-25 LAB — CBC
HEMATOCRIT: 28.7 % — AB (ref 36.0–46.0)
HEMOGLOBIN: 9.1 g/dL — AB (ref 12.0–15.0)
MCH: 27.5 pg (ref 26.0–34.0)
MCHC: 31.7 g/dL (ref 30.0–36.0)
MCV: 86.7 fL (ref 78.0–100.0)
Platelets: 203 10*3/uL (ref 150–400)
RBC: 3.31 MIL/uL — ABNORMAL LOW (ref 3.87–5.11)
RDW: 17.5 % — ABNORMAL HIGH (ref 11.5–15.5)
WBC: 13.1 10*3/uL — ABNORMAL HIGH (ref 4.0–10.5)

## 2015-11-25 MED ORDER — MAGNESIUM SULFATE 4 GM/100ML IV SOLN
4.0000 g | Freq: Once | INTRAVENOUS | Status: AC
  Administered 2015-11-25: 4 g via INTRAVENOUS
  Filled 2015-11-25: qty 100

## 2015-11-25 MED ORDER — POTASSIUM CHLORIDE CRYS ER 20 MEQ PO TBCR
40.0000 meq | EXTENDED_RELEASE_TABLET | Freq: Once | ORAL | Status: AC
  Administered 2015-11-25: 40 meq via ORAL
  Filled 2015-11-25: qty 2

## 2015-11-25 MED ORDER — BOOST / RESOURCE BREEZE PO LIQD
1.0000 | Freq: Two times a day (BID) | ORAL | Status: DC
Start: 2015-11-25 — End: 2015-12-02
  Administered 2015-11-25: 1 via ORAL

## 2015-11-25 NOTE — Progress Notes (Addendum)
Nutrition Follow Up  DOCUMENTATION CODES:   Non-severe (moderate) malnutrition in context of social or environmental circumstances  INTERVENTION:    Boost Breeze po BID, each supplement provides 250 kcal and 9 grams of protein  NUTRITION DIAGNOSIS:   Malnutrition related to chronic illness as evidenced by moderate depletions of muscle mass, moderate depletion of body fat, ongoing  GOAL:   Patient will meet greater than or equal to 90% of their needs, met  MONITOR:   PO intake, Supplement acceptance, Labs, Weight trends, I & O's  ASSESSMENT:   30 y.o. Female with medical history significant of polysubstance dependence and asthma presenting with cough/SOB.  Patient reports that she started coughing which cause her back to start hurting.  Happened about 3 days ago.  +productive of clear, frothy sputum.  No known fevers.  ?sick contacts.  +wheezing, better now.  Diffuse myalgias.  Minimal PO intake, not nauseated just hard to breathe, chew, and swallow simultaneously.  Pt transferred from 2C-Stepdown to 2W-Cardiac 8/22. Pt with tricuspid valve endocarditis from IV drug abuse. PO intake is much improved at 100% per flowsheet records. Malnutrition ongoing. Per MAR declining Nepro Shake supplements. Will order Boost Breeze (clear liquid) supplement to see if she likes.  Diet Order:  Diet regular Room service appropriate? Yes; Fluid consistency: Thin  Skin:  Reviewed, no issues  Last BM:  8/23  Height:   Ht Readings from Last 1 Encounters:  11/23/15 '5\' 4"'$  (1.626 m)    Weight:   Wt Readings from Last 1 Encounters:  11/25/15 115 lb 1.3 oz (52.2 kg)    Ideal Body Weight:  54.5 kg  BMI:  Body mass index is 19.75 kg/m.  Estimated Nutritional Needs:   Kcal:  1500-1700  Protein:  75-85 gm  Fluid:  1.5-1.7 L  EDUCATION NEEDS:   No education needs identified at this time  Arthur Holms, RD, LDN Pager #: [phone] After-Hours Pager #: [phone]

## 2015-11-25 NOTE — Progress Notes (Signed)
Patient ID: Catherine Hernandez, female   DOB: Nov 19, 1985, 30 y.o.   MRN: [id number]          [facility]    Date of Admission:  11/16/2015   Total days of antibiotics 9         She is responding well to her MSSA and enterococcal tricuspid valve endocarditis. We need help from case management to see what her options are for post hospital placement. Ideally she could complete 4 weeks of IV antibiotic therapy in a skilled nursing facility. If that can be arranged I would then place a PICC. She is not a candidate for outpatient IV antibiotic therapy.         Cliffton AstersJohn Shamir Tuzzolino, MD [facility] [phone] pager   [phone] 04/06/2015, 1:32 PM

## 2015-11-25 NOTE — [place] FL2 (Signed)
MEDICAID FL2 LEVEL OF CARE SCREENING TOOL     IDENTIFICATION  Patient Name: Catherine Hernandez Birthdate: 10/23/1985 Sex: female Admission Date (Current Location): 11/16/2015  [facility] [place]   [facility] and Address[facility]  [facility] [address], [place], [place] [address]      Provider Number: [id number]  Attending Physician Name and Address:  Rodolph Bonganiel V Thompson, MD  Relative Name and Phone Number:       Current Level of Care: Hospital Recommended Level of Care: Skilled Nursing Facility Prior Approval Number:    Date Approved/Denied:   PASRR Number: [id number]  Discharge Plan: SNF    Current Diagnoses: Patient Active Problem List   Diagnosis Date Noted  . 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 Polysubstance abuse   . Enterococcal bacteremia 11/18/2015  . Staphylococcus aureus bacteremia 11/18/2015  . Normocytic anemia 11/18/2015  . Protein-calorie malnutrition (HCC) 11/18/2015  . Poor dentition 11/18/2015  . Malnutrition of moderate degree 11/18/2015  . AKI (acute kidney injury) (HCC)   . CAP (community acquired pneumonia) 11/17/2015  . Polysubstance dependence including opioid type drug with complication, continuous use (HCC) 11/17/2015  . Opioid use with withdrawal (HCC) 11/17/2015  . Hyponatremia 11/17/2015  . Acute renal failure (HCC) 11/17/2015  . Hypocalcemia 11/17/2015  . Nonspecific abnormal electrocardiogram (ECG) (EKG) 11/17/2015  . Thrombocytopenia (HCC) 11/17/2015  . Sepsis (HCC) 11/16/2015    Orientation RESPIRATION BLADDER Height & Weight     Self, Time, Situation, Place  Normal Continent Weight: 115 lb 1.3 oz (52.2 kg) Height:  5\' 4"  (162.6 cm)  BEHAVIORAL SYMPTOMS/MOOD NEUROLOGICAL BOWEL NUTRITION STATUS   (none)  (None) Continent Diet (Regular)  AMBULATORY STATUS COMMUNICATION OF NEEDS Skin   Independent Verbally Normal                       Personal Care Assistance Level of  Assistance  Bathing, Feeding, Dressing Bathing Assistance: Independent Feeding assistance: Independent Dressing Assistance: Independent     Functional Limitations Info  Sight, Hearing, Speech Sight Info: Adequate Hearing Info: Adequate Speech Info: Adequate    SPECIAL CARE FACTORS FREQUENCY                       Contractures Contractures Info: Not present    Additional Factors Info  Isolation Precautions, Allergies, Code Status (cefTRIAXone (ROCEPHIN) 2 g in dextrose 5 % 50 mL IVPB Dose: 2 g Freq: Every 12 hours Route: IV and Ampicillin-Sulbactam (UNASYN) 3 g in sodium chloride 0.9 % 100 mL IVPB Dose: 3 g Freq: Every 6 hours Route: IV) Code Status Info: FULL Allergies Info: NKDA     Isolation Precautions Info: MRSA     Current Medications (11/25/2015):  This is the current hospital active medication list Current Facility-Administered Medications  Medication Dose Route Frequency Provider Last Rate Last Dose  . acetaminophen (TYLENOL) tablet 650 mg  650 mg Oral Q6H PRN Jonah BlueJennifer Yates, MD   650 mg at 11/17/15 1802   Or  . acetaminophen (TYLENOL) suppository 650 mg  650 mg Rectal Q6H PRN Jonah BlueJennifer Yates, MD   650 mg at 11/22/15 0549  . albuterol (PROVENTIL) (2.5 MG/3ML) 0.083% nebulizer solution 2.5 mg  2.5 mg Nebulization Q2H PRN Jonah BlueJennifer Yates, MD      . Ampicillin-Sulbactam (UNASYN) 3 g in sodium chloride 0.9 % 100 mL IVPB  3 g Intravenous Q6H Silvana NewnessAndrew D Meyer, RPH   3 g at  11/25/15 1126  . cefTRIAXone (ROCEPHIN) 2 g in dextrose 5 % 50 mL IVPB  2 g Intravenous Q12H Cliffton AstersJohn Campbell, MD   2 g at 11/25/15 1248  . feeding supplement (BOOST / RESOURCE BREEZE) liquid 1 Container  1 Container Oral BID BM Rodolph Bonganiel V Thompson, MD      . folic acid (FOLVITE) tablet 1 mg  1 mg Oral Daily Lynita LombardJennifer D OrlandDurham, RPH   1 mg at 11/25/15 1201  . LORazepam (ATIVAN) tablet 0.5 mg  0.5 mg Oral Q8H PRN Rodolph Bonganiel V Thompson, MD   0.5 mg at 11/25/15 0901  . magnesium oxide (MAG-OX) tablet 400 mg  400 mg Oral  BID Rodolph Bonganiel V Thompson, MD   400 mg at 11/25/15 1201  . methocarbamol (ROBAXIN) 500 mg in dextrose 5 % 50 mL IVPB  500 mg Intravenous Q6H PRN Jonah BlueJennifer Yates, MD   500 mg at 11/21/15 0402  . multivitamin with minerals tablet 1 tablet  1 tablet Oral Daily Jonah BlueJennifer Yates, MD   1 tablet at 11/25/15 1202  . nicotine (NICODERM CQ - dosed in mg/24 hours) patch 14 mg  14 mg Transdermal Daily Jonah BlueJennifer Yates, MD   14 mg at 11/24/15 0936  . ondansetron (ZOFRAN) tablet 4 mg  4 mg Oral Q6H PRN Jonah BlueJennifer Yates, MD       Or  . ondansetron [facility](ZOFRAN) injection 4 mg  4 mg Intravenous Q6H PRN Jonah BlueJennifer Yates, MD      . predniSONE (DELTASONE) tablet 40 mg  40 mg Oral Q breakfast Catarina Hartshornavid Tat, MD   40 mg at 11/25/15 0616  . sodium bicarbonate tablet 650 mg  650 mg Oral TID Rodolph Bonganiel V Thompson, MD   650 mg at 11/25/15 1201  . sodium chloride 0.9 % bolus 250 mL  250 mL Intravenous Q4H PRN Jonah BlueJennifer Yates, MD   250 mL at 11/17/15 0212  . sodium chloride flush (NS) 0.9 % injection 3 mL  3 mL Intravenous Q12H Jonah BlueJennifer Yates, MD   3 mL at 11/24/15 0817  . technetium TC 33M diethylenetriame-pentaacetic acid (DTPA) injection 30.3 millicurie  30.3 millicurie Intravenous Once PRN Leanna BattlesMelinda Blietz, MD      . thiamine (VITAMIN B-1) tablet 100 mg  100 mg Oral Daily Lynita LombardJennifer D Earl ParkDurham, RPH   100 mg at 11/25/15 1200  . traMADol (ULTRAM) tablet 100 mg  100 mg Oral Q12H PRN Jonah BlueJennifer Yates, MD   100 mg at 11/25/15 [id number]     Discharge Medications: Please see discharge summary for a list of discharge medications.  Relevant Imaging Results:  Relevant Lab Results:   Additional Information SSN:[id number]  Reggy EyeLaShonda A Kordae Buonocore, LCSW

## 2015-11-25 NOTE — Progress Notes (Signed)
Patient was encouraged to walk in the hall.  Patient refused to walk if she could not go outside and get fresh air at this time.

## 2015-11-25 NOTE — Progress Notes (Signed)
PROGRESS NOTE    Catherine Hernandez  ZOX:[id number] DOB: 1985-04-06 DOA: 11/16/2015 PCP: No PCP Per Patient    Brief Narrative:  30 year old female with a history of polysubstance abuse (cocaine, heroin, tobacco, marijuana), asthma presented with increasing shortness of breath and coughing. Her symptoms were associated with diffuse myalgias and poor oral intake. Apparently, the patient's last heroin and cocaine use was approximately [facility]weeks priorto admission. Upon presentation. The patient was noted to be febrile with a fever up to 101.60F, hypertension, and tachycardia. Code sepsis was activated, and the patient was started on intravenous fluids and intravenous antibiotics.The patient was found to have MRSA and Enterococcus faecalis bacteremia. ID was consulted to help manage antibiotics. The patient had tricuspid valve vegetation on TTE. TEE done on 11/23/15--see below.  Assessment & Plan:   Principal Problem:   Endocarditis of native valve Active Problems:   Sepsis (HCC)   CAP (community acquired pneumonia)   Polysubstance dependence including opioid type drug with complication, continuous use (HCC)   Opioid use with withdrawal (HCC)   Hyponatremia   Acute renal failure (HCC)   Hypocalcemia   Nonspecific abnormal electrocardiogram (ECG) (EKG)   Thrombocytopenia (HCC)   Enterococcal bacteremia   Staphylococcus aureus bacteremia   Normocytic anemia   Protein-calorie malnutrition (HCC)   Poor dentition   AKI (acute kidney injury) (HCC)   Malnutrition of moderate degree   Polysubstance abuse  Sepsis -due to bacteremia with metastatic infection -patient has pulmonary infiltrates which representsmetastatic infection from bacteremia -Saline lockIV fluids--sepsis physiology resolved -Continue Unasyn and IV Rocephin -lactic acid 2.16-->0.99 -procalcitonin 18.52 -11/22/15--new fever-->repeat blood culture, UA--neg to date 11/22/15-repeat CXR--interstitial prominence-->lasix IV x  1 -11/23/15 TEE--2 large shaggy and very mobile vegetations on both leaflets of the TV -wean stress steroids to prednisone -: Continue empiric IV antibiotics.  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 Patient has been assessed by cardiothoracic surgery, who feel no indication for dry scalp stated 5 and a repair at this time. Is felt patient's currently not a surgical candidate. Continue empiric IV antibiotics and will need follow-up 2-D echo towards the end of IV antibiotic course. ID following.  Pulmonary infiltrates/Septic emboli -V/Q scan--indeterminate -CT chest without contrast--multifocal nodular consolidations with cavitations likely septic emboli; RLL consolidation/wedge-shaped infarct -hemoptysis improved. Continue IV antibiotics.  Acute kidney injury -secondary to sepsis and hemodynamic changes -04/09/2013 serum creatinine 1.01 -11/16/2015 serum creatinine 4.43 -Renal ultrasound--bilateral echogenic kidneys; no hydronephrosis -UA with pyuria and proteinuria -check urine protein creatinine ratio--1.24 -improving  Hyponatremia -likely volume depletion -improved with IVF  Thrombocytopenia -due to sepsis -HIV antibody--neg -hep B and C--negative -improving with tx of infection  Hypokalemia/hypomagnesemia. -replete potassium. Keep magnesium greater than 2. Magnesium today is 1.6. Replete magnesium.  Hypocalcemia -improved 25 vitamin D--20.4-->supplement  Hemoptysis -INR 1.45 -PTT 40 -trend  Hgb-->drop in Hgb likely dilution--no other obvious source of blood loss -improved  Anemia of chronic disease -drop likely dilution -11/22/15--transfused 2 units PRBC     DVT prophylaxis: scd Code Status: Full Family Communication: Updated patient. No family at bedside. Disposition Plan: Skilled nursing facility when medically stable and PICC line has been  placed.   Consultants:   Infectious diseases: Dr. Orvan Falconer 11/18/2015  CT surgery: Dr. Donata Clay 11/23/2015  Cardiology for TEE.  Procedures:   TEE 11/23/2015  Chest x-ray 11/16/2015, 11/19/2015, 11/22/2015  2 d echo 11/18/2015  Renal ultrasound 11/17/2015   CT Chest 11/17/2015  VQ scan 11/17/2015   Transfuse 2 units packed red blood cells a 21 2017      Antimicrobials:   IV Unasyn 11/18/2015  IV Rocephin 11/22/2015  IV Zosyn 11/16/2015>>>> 11/17/2015  IV Vancomycin 11/16/2015>>>>>11/18/2015   Subjective: Patient denies chest pain. No shortness of breath.   Objective: Vitals:   11/23/15 2035 11/24/15 0500 11/24/15 2004 11/25/15 0533  BP: 118/70 116/76 113/70 116/78  Pulse: (!) 58 (!) 48 (!) 50 70  Resp: 20 20 20 20   Temp: 97.5 F (36.4 C) 98.2 F (36.8 C) 98 F (36.7 C) 99 F (37.2 C)  TempSrc: Oral Oral Oral Oral  SpO2: 99% 98% 98% 98%  Weight: 49.9 kg (109 lb 14.4 oz) 50.1 kg (110 lb 7.2 oz)  52.2 kg (115 lb 1.3 oz)  Height:        Intake/Output Summary (Last 24 hours) at 11/25/15 1117 Last data filed at 11/25/15 1000  Gross per 24 hour  Intake              990 ml  Output                0 ml  Net              990 ml   Filed Weights   11/23/15 2035 11/24/15 0500 11/25/15 0533  Weight: 49.9 kg (109 lb 14.4 oz) 50.1 kg (110 lb 7.2 oz) 52.2 kg (115 lb 1.3 oz)    Examination:  General exam: Appears calm and comfortable. Respiratory system: Clear to auscultation. Respiratory effort normal. Cardiovascular system: S1 & S2 heard, RRR. No JVD, murmurs, rubs, gallops or clicks. No pedal  edema. Gastrointestinal system: Abdomen is nondistended, soft and nontender. No organomegaly or masses felt. Normal bowel sounds heard. Central nervous system: Alert and oriented. No focal neurological deficits. Extremities: Symmetric 5 x 5 power. Skin: No rashes, lesions or ulcers Psychiatry: Judgement and insight appear normal. Mood & affect appropriate.     Data Reviewed: I have personally reviewed following labs and imaging studies  CBC:  Recent Labs Lab 11/19/15 1029 11/22/15 0238 11/22/15 2238 11/23/15 0257 11/24/15 0251 11/25/15 0652  WBC 9.0 7.9  --  11.0* 10.9* 13.1*  HGB 8.4* 6.8* 9.1* 8.9* 8.8* 9.1*  HCT 26.4* 22.0* 27.5* 27.1* 27.3* 28.7*  MCV 80.7 83.3  --  83.4 85.0 86.7  PLT 74* 152  --  152 145* 203   Basic Metabolic Panel:  Recent Labs Lab 11/20/15 0334 11/21/15 1127 11/22/15 0238 11/23/15 0257 11/24/15 0251 11/25/15 0652  NA 135 137 139 139 140 136  K 4.8 4.0 3.1* 3.3* 4.3 3.3*  CL 110 114* 115* 113* 113* 106  CO2 17* 15* 19* 19* 16* 23  GLUCOSE 170* 148* 100* 139* 174* 84  BUN 63* 46* 37* 21* 23* 13  CREATININE 3.15* 1.92* 1.68* 1.23* 1.08* 0.77  CALCIUM 7.5* 8.0* 7.4* 7.0* 6.7* 7.3*  MG 2.3  --   --  1.3* 1.3* 1.6*   GFR: Estimated Creatinine Clearance: 86.3 mL/min (by C-G formula based on SCr of 0.8 mg/dL). Liver Function Tests: No results for input(s): AST, ALT, ALKPHOS, BILITOT, PROT, ALBUMIN in the last 168 hours. No results for input(s): LIPASE,  AMYLASE in the last 168 hours. No results for input(s): AMMONIA in the last 168 hours. Coagulation Profile: No results for input(s): INR, PROTIME in the last 168 hours. Cardiac Enzymes: No results for input(s): CKTOTAL, CKMB, CKMBINDEX, TROPONINI in the last 168 hours. BNP (last 3 results) No results for input(s): PROBNP in the last 8760 hours. HbA1C: No results for input(s): HGBA1C in the last 72 hours. CBG: No results for input(s): GLUCAP in the last 168 hours. Lipid Profile: No  results for input(s): CHOL, HDL, LDLCALC, TRIG, CHOLHDL, LDLDIRECT in the last 72 hours. Thyroid Function Tests: No results for input(s): TSH, T4TOTAL, FREET4, T3FREE, THYROIDAB in the last 72 hours. Anemia Panel: No results for input(s): VITAMINB12, FOLATE, FERRITIN, TIBC, IRON, RETICCTPCT in the last 72 hours. Sepsis Labs: No results for input(s): PROCALCITON, LATICACIDVEN in the last 168 hours.  Recent Results (from the past 240 hour(s))  Blood Culture (routine x 2)     Status: Abnormal   Collection Time: 11/16/15  8:30 PM  Result Value Ref Range Status   Specimen Description BLOOD RIGHT ARM  Final   Special Requests BOTTLES DRAWN AEROBIC AND ANAEROBIC 5CC  Final   Culture  Setup Time   Final    GRAM POSITIVE COCCI IN CLUSTERS IN BOTH AEROBIC AND ANAEROBIC BOTTLES CRITICAL RESULT CALLED TO, READ BACK BY AND VERIFIED WITH: L POWELL PHARMD 1800 11/17/15 A BROWNING    Culture (A)  Final    STAPHYLOCOCCUS AUREUS ENTEROCOCCUS FAECALIS SUSCEPTIBILITIES PERFORMED ON PREVIOUS CULTURE WITHIN THE LAST 5 DAYS. Performed at [facility]    Report Status 11/21/2015 FINAL  Final   Organism ID, Bacteria STAPHYLOCOCCUS AUREUS  Final      Susceptibility   Staphylococcus aureus - MIC*    CIPROFLOXACIN <=0.5 SENSITIVE Sensitive     ERYTHROMYCIN <=0.25 SENSITIVE Sensitive     GENTAMICIN <=0.5 SENSITIVE Sensitive     OXACILLIN 0.5 SENSITIVE Sensitive     TETRACYCLINE <=1 SENSITIVE Sensitive     VANCOMYCIN <=0.5 SENSITIVE Sensitive     TRIMETH/SULFA <=10 SENSITIVE Sensitive     CLINDAMYCIN <=0.25 SENSITIVE Sensitive     RIFAMPIN <=0.5 SENSITIVE Sensitive     Inducible Clindamycin NEGATIVE Sensitive     * STAPHYLOCOCCUS AUREUS  Blood Culture ID Panel (Reflexed)     Status: Abnormal   Collection Time: 11/16/15  8:30 PM  Result Value Ref Range Status   Enterococcus species DETECTED (A) NOT DETECTED Final    Comment: CRITICAL RESULT CALLED TO, READ BACK BY AND VERIFIED WITH: L POWELL  PHARMD 1800 11/17/15 A BROWNING    Vancomycin resistance NOT DETECTED NOT DETECTED Final   Listeria monocytogenes NOT DETECTED NOT DETECTED Final   Staphylococcus species DETECTED (A) NOT DETECTED Final    Comment: CRITICAL RESULT CALLED TO, READ BACK BY AND VERIFIED WITH: L POWELL PHARMD 1800 11/17/15 A BROWNING    Staphylococcus aureus DETECTED (A) NOT DETECTED Final    Comment: CRITICAL RESULT CALLED TO, READ BACK BY AND VERIFIED WITH: L POWELL PHARMD 1800 11/17/15 A BROWNING    Methicillin resistance NOT DETECTED NOT DETECTED Final   Streptococcus species NOT DETECTED NOT DETECTED Final   Streptococcus agalactiae NOT DETECTED NOT DETECTED Final   Streptococcus pneumoniae NOT DETECTED NOT DETECTED Final   Streptococcus pyogenes NOT DETECTED NOT DETECTED Final   Acinetobacter baumannii NOT DETECTED NOT DETECTED Final   Enterobacteriaceae species NOT DETECTED NOT DETECTED Final   Enterobacter cloacae complex NOT DETECTED NOT DETECTED Final   Escherichia coli  NOT DETECTED NOT DETECTED Final   Klebsiella oxytoca NOT DETECTED NOT DETECTED Final   Klebsiella pneumoniae NOT DETECTED NOT DETECTED Final   Proteus species NOT DETECTED NOT DETECTED Final   Serratia marcescens NOT DETECTED NOT DETECTED Final   Carbapenem resistance NOT DETECTED NOT DETECTED Final   Haemophilus influenzae NOT DETECTED NOT DETECTED Final   Neisseria meningitidis NOT DETECTED NOT DETECTED Final   Pseudomonas aeruginosa NOT DETECTED NOT DETECTED Final   Candida albicans NOT DETECTED NOT DETECTED Final   Candida glabrata NOT DETECTED NOT DETECTED Final   Candida krusei NOT DETECTED NOT DETECTED Final   Candida parapsilosis NOT DETECTED NOT DETECTED Final   Candida tropicalis NOT DETECTED NOT DETECTED Final    Comment: Performed at [facility]  Urine culture     Status: Abnormal   Collection Time: 11/16/15  8:50 PM  Result Value Ref Range Status   Specimen Description URINE, RANDOM  Final   Special  Requests NONE  Final   Culture MULTIPLE SPECIES PRESENT, SUGGEST RECOLLECTION (A)  Final   Report Status 11/18/2015 FINAL  Final  Blood Culture (routine x 2)     Status: Abnormal   Collection Time: 11/16/15  9:30 PM  Result Value Ref Range Status   Specimen Description BLOOD LEFT FOREARM  Final   Special Requests BOTTLES DRAWN AEROBIC AND ANAEROBIC 5CC  Final   Culture  Setup Time   Final    GRAM POSITIVE COCCI IN CLUSTERS IN BOTH AEROBIC AND ANAEROBIC BOTTLES CRITICAL RESULT CALLED TO, READ BACK BY AND VERIFIED WITH: L POWELL PHARMD 1800 11/17/15 A BROWNING    Culture (A)  Final    STAPHYLOCOCCUS AUREUS SUSCEPTIBILITIES PERFORMED ON PREVIOUS CULTURE WITHIN THE LAST 5 DAYS. Performed at [facility]    Report Status 11/20/2015 FINAL  Final  MRSA PCR Screening     Status: Abnormal   Collection Time: 11/17/15 12:45 AM  Result Value Ref Range Status   MRSA by PCR POSITIVE (A) NEGATIVE Final    Comment:        The GeneXpert MRSA Assay (FDA approved for NASAL specimens only), is one component of a comprehensive MRSA colonization surveillance program. It is not intended to diagnose MRSA infection nor to guide or monitor treatment for MRSA infections. RESULT CALLED TO, READ BACK BY AND VERIFIED WITH: K ABDUSSURAN,RN @0440  11/17/15 MKELLY   Culture, blood (x 2)     Status: Abnormal   Collection Time: 11/17/15  2:35 AM  Result Value Ref Range Status   Specimen Description BLOOD BLOOD LEFT HAND  Final   Special Requests IN PEDIATRIC BOTTLE 1CC  Final   Culture  Setup Time   Final    GRAM POSITIVE COCCI IN CLUSTERS AEROBIC BOTTLE ONLY CRITICAL VALUE NOTED.  VALUE IS CONSISTENT WITH PREVIOUSLY REPORTED AND CALLED VALUE.    Culture (A)  Final    STAPHYLOCOCCUS AUREUS SUSCEPTIBILITIES PERFORMED ON PREVIOUS CULTURE WITHIN THE LAST 5 DAYS.    Report Status 11/20/2015 FINAL  Final  Culture, blood (x 2)     Status: Abnormal   Collection Time: 11/17/15  2:45 AM  Result Value  Ref Range Status   Specimen Description BLOOD BLOOD RIGHT HAND  Final   Special Requests IN PEDIATRIC BOTTLE 2CC  Final   Culture  Setup Time   Final    GRAM POSITIVE COCCI IN CLUSTERS AEROBIC BOTTLE ONLY CRITICAL RESULT CALLED TO, READ BACK BY AND VERIFIED WITH: L POWELL PHARMD 1800 11/17/15 A BROWNING  Culture (A)  Final    STAPHYLOCOCCUS AUREUS SUSCEPTIBILITIES PERFORMED ON PREVIOUS CULTURE WITHIN THE LAST 5 DAYS.    Report Status 11/20/2015 FINAL  Final  Culture, blood (Routine X 2) w Reflex to ID Panel     Status: Abnormal   Collection Time: 11/18/15  7:10 AM  Result Value Ref Range Status   Specimen Description BLOOD RIGHT HAND  Final   Special Requests IN PEDIATRIC BOTTLE 3CC  Final   Culture  Setup Time   Final    GRAM POSITIVE COCCI IN CLUSTERS AEROBIC BOTTLE ONLY CRITICAL RESULT CALLED TO, READ BACK BY AND VERIFIED WITH: G ABBOTT PHARMD 2302 11/18/15 A BROWNING    Culture (A)  Final    STAPHYLOCOCCUS AUREUS SUSCEPTIBILITIES PERFORMED ON PREVIOUS CULTURE WITHIN THE LAST 5 DAYS. ENTEROCOCCUS FAECALIS    Report Status 11/21/2015 FINAL  Final   Organism ID, Bacteria ENTEROCOCCUS FAECALIS  Final      Susceptibility   Enterococcus faecalis - MIC*    AMPICILLIN <=2 SENSITIVE Sensitive     VANCOMYCIN 2 SENSITIVE Sensitive     GENTAMICIN SYNERGY SENSITIVE Sensitive     * ENTEROCOCCUS FAECALIS  Culture, blood (Routine X 2) w Reflex to ID Panel     Status: Abnormal   Collection Time: 11/18/15  8:41 AM  Result Value Ref Range Status   Specimen Description BLOOD LEFT ARM  Final   Special Requests IN PEDIATRIC BOTTLE 3CC  Final   Culture  Setup Time   Final    GRAM POSITIVE COCCI IN CLUSTERS IN PEDIATRIC BOTTLE CRITICAL RESULT CALLED TO, READ BACK BY AND VERIFIED WITH: G ABBOTT PHARMD 2302 11/18/15 A BROWNING    Culture (A)  Final    STAPHYLOCOCCUS AUREUS SUSCEPTIBILITIES PERFORMED ON PREVIOUS CULTURE WITHIN THE LAST 5 DAYS.    Report Status 11/20/2015 FINAL  Final   Culture, blood (routine x 2)     Status: None   Collection Time: 11/19/15  1:25 PM  Result Value Ref Range Status   Specimen Description BLOOD RIGHT HAND  Final   Special Requests IN PEDIATRIC BOTTLE 3ML  Final   Culture NO GROWTH 5 DAYS  Final   Report Status 11/24/2015 FINAL  Final  Culture, blood (routine x 2)     Status: None   Collection Time: 11/19/15  1:28 PM  Result Value Ref Range Status   Specimen Description BLOOD LEFT HAND  Final   Special Requests IN PEDIATRIC BOTTLE 3ML  Final   Culture NO GROWTH 5 DAYS  Final   Report Status 11/24/2015 FINAL  Final  Urine culture     Status: Abnormal   Collection Time: 11/22/15  9:41 PM  Result Value Ref Range Status   Specimen Description URINE, RANDOM  Final   Special Requests NONE  Final   Culture <10,000 COLONIES/mL INSIGNIFICANT GROWTH (A)  Final   Report Status 11/24/2015 FINAL  Final  Culture, blood (Routine X 2) w Reflex to ID Panel     Status: None (Preliminary result)   Collection Time: 11/22/15 10:20 PM  Result Value Ref Range Status   Specimen Description BLOOD RIGHT FOREARM  Final   Special Requests IN PEDIATRIC BOTTLE 1CC  Final   Culture NO GROWTH 2 DAYS  Final   Report Status PENDING  Incomplete  Culture, blood (Routine X 2) w Reflex to ID Panel     Status: None (Preliminary result)   Collection Time: 11/22/15 10:39 PM  Result Value Ref Range Status   Specimen  Description BLOOD LEFT HAND  Final   Special Requests IN PEDIATRIC BOTTLE 0.5CC  Final   Culture  Setup Time   Final    GRAM POSITIVE COCCI IN CLUSTERS AEROBIC BOTTLE ONLY CRITICAL VALUE NOTED.  VALUE IS CONSISTENT WITH PREVIOUSLY REPORTED AND CALLED VALUE.    Culture GRAM POSITIVE COCCI  Final   Report Status PENDING  Incomplete         Radiology Studies: No results found.      Scheduled Meds: . ampicillin-sulbactam (UNASYN) IV  3 g Intravenous Q6H  . cefTRIAXone (ROCEPHIN)  IV  2 g Intravenous Q12H  . feeding supplement (NEPRO CARB  STEADY)  237 mL Oral BID BM  . folic acid  1 mg Oral Daily  . magnesium oxide  400 mg Oral BID  . multivitamin with minerals  1 tablet Oral Daily  . nicotine  14 mg Transdermal Daily  . predniSONE  40 mg Oral Q breakfast  . sodium bicarbonate  650 mg Oral TID  . sodium chloride flush  3 mL Intravenous Q12H  . thiamine  100 mg Oral Daily   Continuous Infusions:    LOS: 9 days    Time spent: 35 minutes    THOMPSON,DANIEL, MD [facility] Pager [phone]  If 7PM-7AM, please contact night-coverage [email or web address] Password [facility] 11/25/2015, 11:17 AM

## 2015-11-26 DIAGNOSIS — R0682 Tachypnea, not elsewhere classified: Secondary | ICD-10-CM

## 2015-11-26 DIAGNOSIS — E46 Unspecified protein-calorie malnutrition: Secondary | ICD-10-CM

## 2015-11-26 DIAGNOSIS — F1193 Opioid use, unspecified with withdrawal: Secondary | ICD-10-CM

## 2015-11-26 LAB — MAGNESIUM: Magnesium: 1.6 mg/dL — ABNORMAL LOW (ref 1.7–2.4)

## 2015-11-26 LAB — BASIC METABOLIC PANEL
Anion gap: 6 (ref 5–15)
BUN: 9 mg/dL (ref 6–20)
CHLORIDE: 106 mmol/L (ref 101–111)
CO2: 26 mmol/L (ref 22–32)
CREATININE: 0.7 mg/dL (ref 0.44–1.00)
Calcium: 6.8 mg/dL — ABNORMAL LOW (ref 8.9–10.3)
GFR calc Af Amer: >60 mL/min (ref >60)
GFR calc non Af Amer: >60 mL/min (ref >60)
GLUCOSE: 116 mg/dL — AB (ref 65–99)
Potassium: 3.4 mmol/L — ABNORMAL LOW (ref 3.5–5.1)
SODIUM: 138 mmol/L (ref 135–145)

## 2015-11-26 LAB — CULTURE, BLOOD (ROUTINE X 2)

## 2015-11-26 MED ORDER — TRAMADOL HCL 50 MG PO TABS
100.0000 mg | ORAL_TABLET | Freq: Three times a day (TID) | ORAL | Status: DC | PRN
  Administered 2015-11-26 – 2015-12-02 (×17): 100 mg via ORAL
  Filled 2015-11-26 (×17): qty 2

## 2015-11-26 NOTE — Care Management Note (Signed)
Case Management Note Donn PieriniKristi Chrystal Zeimet RN, BSN Unit 2W-Case Manager [phone]  Patient Details  Name: Margretta Dittyngela N Coghlan MRN: [id number] Date of Birth: [birth year]  Subjective/Objective:  Pt admitted with endocarditis, Hx IVDU                   Action/Plan: PTA pt lived at home- per MD will need 4-6 wks of IV abx- will need SNF due to IVDU hx- CSW following for bed search- plan for PICC once SNF bed found- repeat BC drawn 8/25  Expected Discharge Date:                  Expected Discharge Plan:  Skilled Nursing Facility  In-House Referral:  Clinical Social Work  Discharge planning Services  CM Consult  Post Acute Care Choice:    Choice offered to:     DME Arranged:    DME Agency:     HH Arranged:    HH Agency:     Status of Service:  In process, will continue to follow  If discussed at Long Length of Stay Meetings, dates discussed:    Additional Comments:  Darrold SpanWebster, [place] Hynson Hall, RN 11/26/2015, 3:44 PM

## 2015-11-26 NOTE — Progress Notes (Signed)
PROGRESS NOTE    Catherine Hernandez  OZH:[id number] DOB: 1985/07/20 DOA: 11/16/2015 PCP: No PCP Per Patient    Brief Narrative:  30 year old female with a history of polysubstance abuse (cocaine, heroin, tobacco, marijuana), asthma presented with increasing shortness of breath and coughing. Her symptoms were associated with diffuse myalgias and poor oral intake. Apparently, the patient's last heroin and cocaine use was approximately [facility] priorto admission. Upon presentation. The patient was noted to be febrile with a fever up to 101.43F, hypertension, and tachycardia. Code sepsis was activated, and the patient was started on intravenous fluids and intravenous antibiotics.The patient was found to have MRSA and Enterococcus faecalis bacteremia. ID was consulted to help manage antibiotics. The patient had tricuspid valve vegetation on TTE. TEE done on 11/23/15--see below.  Assessment & Plan:   Principal Problem:   Endocarditis of native valve Active Problems:   Sepsis (HCC)   CAP (community acquired pneumonia)   Polysubstance dependence including opioid type drug with complication, continuous use (HCC)   Opioid use with withdrawal (HCC)   Hyponatremia   Acute renal failure (HCC)   Hypocalcemia   Nonspecific abnormal electrocardiogram (ECG) (EKG)   Thrombocytopenia (HCC)   Enterococcal bacteremia   Staphylococcus aureus bacteremia   Normocytic anemia   Protein-calorie malnutrition (HCC)   Poor dentition   AKI (acute kidney injury) (HCC)   Malnutrition of moderate degree   Polysubstance abuse   Dyspnea  Sepsis -due to bacteremia with metastatic infection -patient has pulmonary infiltrates which representsmetastatic infection from bacteremia -Saline lockIV fluids--sepsis physiology resolved -Continue Unasyn and IV Rocephin -lactic acid 2.16-->0.99 -procalcitonin 18.52 -11/22/15--new fever-->repeat blood culture, UA--neg to date 11/22/15-repeat CXR--interstitial  prominence-->lasix IV x 1 -11/23/15 TEE--2 large shaggy and very mobile vegetations on both leaflets of the TV -wean stress steroids to prednisone -: Continue empiric IV antibiotics.  Tachypnea -11/23/15 CXR--interstitial prominence-->lasix IV x 1 -in part due to metabolic acidosis--started bicarbonate po -98-100% on RA  MSSA & E faecalisBacteremia -abx per ID -requested TEE--scheduled for 11/22/15 -8/15, 8/16, 8/17--blood culture --positive -11/19/15 blood culture--neg @ 96hours -11/22/15 with 1/2 blood cultures positive sensitivities pending -PICC line when ok with ID -Repeat blood cultures ordered per ID 11/26/2015 pending  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%, tricuspid valve vegetations --11/23/15 TEE--2 large shaggy and very mobile vegetations on both leaflets of the TV. Patient has been assessed by cardiothoracic surgery, who feel no indication for dry scalp stated 5 and a repair at this time. Is felt patient's currently not a surgical candidate. Continue empiric IV antibiotics and will need follow-up 2-D echo towards the end of IV antibiotic course. ID following.  Pulmonary infiltrates/Septic emboli -V/Q scan--indeterminate -CT chest without contrast--multifocal nodular consolidations with cavitations likely septic emboli; RLL consolidation/wedge-shaped infarct -hemoptysis improved. Continue IV antibiotics.  Acute kidney injury -secondary to sepsis and hemodynamic changes -04/09/2013 serum creatinine 1.01 -11/16/2015 serum creatinine 4.43 -Renal ultrasound--bilateral echogenic kidneys; no hydronephrosis -UA with pyuria and proteinuria -check urine protein creatinine ratio--1.24 -improving  Hyponatremia -likely volume depletion -improved with IVF  Thrombocytopenia -due to sepsis -HIV antibody--neg -hep B and C--negative -improving with tx of infection  Hypokalemia/hypomagnesemia. -replete potassium. Keep magnesium greater than 2.  Magnesium today is 1.6. Replete magnesium.  Hypocalcemia -improved 25 vitamin D--20.4-->supplement  Hemoptysis -INR 1.45 -PTT 40 -trend Hgb-->drop in Hgb likely dilution--no other obvious source of blood loss -improved  Anemia of chronic disease -drop likely dilution -11/22/15--transfused 2 units PRBC     DVT prophylaxis: scd Code Status: Full Family Communication:  Updated patient. No family at bedside. Disposition Plan: Skilled nursing facility when medically stable and PICC line has been placed, and repeat blood culture negative.   Consultants:   Infectious diseases: Dr. Orvan Falconer 11/18/2015  CT surgery: Dr. Donata Clay 11/23/2015  Cardiology for TEE.  Procedures:   TEE 11/23/2015  Chest x-ray 11/16/2015, 11/19/2015, 11/22/2015  2 d echo 11/18/2015  Renal ultrasound 11/17/2015   CT Chest 11/17/2015  VQ scan 11/17/2015   Transfuse 2 units packed red blood cells a 21 2017      Antimicrobials:   IV Unasyn 11/18/2015  IV Rocephin 11/22/2015  IV Zosyn 11/16/2015>>>> 11/17/2015  IV Vancomycin 11/16/2015>>>>>11/18/2015   Subjective: Patient denies chest pain. No shortness of breath. Patient c/o back pain.  Objective: Vitals:   11/25/15 0533 11/25/15 1345 11/25/15 1900 11/26/15 0548  BP: 116/78 113/70 118/83 118/67  Pulse: 70 70 69 82  Resp: 20 20 20 20   Temp: 99 F (37.2 C) 97.7 F (36.5 C) 98.1 F (36.7 C) 99.6 F (37.6 C)  TempSrc: Oral Oral Oral Oral  SpO2: 98% 99% 99% 97%  Weight: 52.2 kg (115 lb 1.3 oz)   52 kg (114 lb 9.6 oz)  Height:       No intake or output data in the 24 hours ending 11/26/15 1040 Filed Weights   11/24/15 0500 11/25/15 0533 11/26/15 0548  Weight: 50.1 kg (110 lb 7.2 oz) 52.2 kg (115 lb 1.3 oz) 52 kg (114 lb 9.6 oz)    Examination:  General exam: Appears calm and comfortable. Respiratory system: Clear to auscultation. Respiratory effort normal. Cardiovascular system: S1 & S2 heard, RRR. No JVD, murmurs, rubs,  gallops or clicks. No pedal edema. Gastrointestinal system: Abdomen is nondistended, soft and nontender. No organomegaly or masses felt. Normal bowel sounds heard. Central nervous system: Alert and oriented. No focal neurological deficits. Extremities: Symmetric 5 x 5 power. Skin: No rashes, lesions or ulcers Psychiatry: Judgement and insight appear normal. Mood & affect appropriate.     Data Reviewed: I have personally reviewed following labs and imaging studies  CBC:  Recent Labs Lab 11/22/15 0238 11/22/15 2238 11/23/15 0257 11/24/15 0251 11/25/15 0652  WBC 7.9  --  11.0* 10.9* 13.1*  HGB 6.8* 9.1* 8.9* 8.8* 9.1*  HCT 22.0* 27.5* 27.1* 27.3* 28.7*  MCV 83.3  --  83.4 85.0 86.7  PLT 152  --  152 145* 203   Basic Metabolic Panel:  Recent Labs Lab 11/20/15 0334 11/21/15 1127 11/22/15 0238 11/23/15 0257 11/24/15 0251 11/25/15 0652  NA 135 137 139 139 140 136  K 4.8 4.0 3.1* 3.3* 4.3 3.3*  CL 110 114* 115* 113* 113* 106  CO2 17* 15* 19* 19* 16* 23  GLUCOSE 170* 148* 100* 139* 174* 84  BUN 63* 46* 37* 21* 23* 13  CREATININE 3.15* 1.92* 1.68* 1.23* 1.08* 0.77  CALCIUM 7.5* 8.0* 7.4* 7.0* 6.7* 7.3*  MG 2.3  --   --  1.3* 1.3* 1.6*   GFR: Estimated Creatinine Clearance: 85.9 mL/min (by C-G formula based on SCr of 0.8 mg/dL). Liver Function Tests: No results for input(s): AST, ALT, ALKPHOS, BILITOT, PROT, ALBUMIN in the last 168 hours. No results for input(s): LIPASE, AMYLASE in the last 168 hours. No results for input(s): AMMONIA in the last 168 hours. Coagulation Profile: No results for input(s): INR, PROTIME in the last 168 hours. Cardiac Enzymes: No results for input(s): CKTOTAL, CKMB, CKMBINDEX, TROPONINI in the last 168 hours. BNP (last 3 results) No results for  input(s): PROBNP in the last 8760 hours. HbA1C: No results for input(s): HGBA1C in the last 72 hours. CBG: No results for input(s): GLUCAP in the last 168 hours. Lipid Profile: No results for  input(s): CHOL, HDL, LDLCALC, TRIG, CHOLHDL, LDLDIRECT in the last 72 hours. Thyroid Function Tests: No results for input(s): TSH, T4TOTAL, FREET4, T3FREE, THYROIDAB in the last 72 hours. Anemia Panel: No results for input(s): VITAMINB12, FOLATE, FERRITIN, TIBC, IRON, RETICCTPCT in the last 72 hours. Sepsis Labs: No results for input(s): PROCALCITON, LATICACIDVEN in the last 168 hours.  Recent Results (from the past 240 hour(s))  Blood Culture (routine x 2)     Status: Abnormal   Collection Time: 11/16/15  8:30 PM  Result Value Ref Range Status   Specimen Description BLOOD RIGHT ARM  Final   Special Requests BOTTLES DRAWN AEROBIC AND ANAEROBIC 5CC  Final   Culture  Setup Time   Final    GRAM POSITIVE COCCI IN CLUSTERS IN BOTH AEROBIC AND ANAEROBIC BOTTLES CRITICAL RESULT CALLED TO, READ BACK BY AND VERIFIED WITH: L POWELL PHARMD 1800 11/17/15 A BROWNING    Culture (A)  Final    STAPHYLOCOCCUS AUREUS ENTEROCOCCUS FAECALIS SUSCEPTIBILITIES PERFORMED ON PREVIOUS CULTURE WITHIN THE LAST 5 DAYS. Performed at [facility]    Report Status 11/21/2015 FINAL  Final   Organism ID, Bacteria STAPHYLOCOCCUS AUREUS  Final      Susceptibility   Staphylococcus aureus - MIC*    CIPROFLOXACIN <=0.5 SENSITIVE Sensitive     ERYTHROMYCIN <=0.25 SENSITIVE Sensitive     GENTAMICIN <=0.5 SENSITIVE Sensitive     OXACILLIN 0.5 SENSITIVE Sensitive     TETRACYCLINE <=1 SENSITIVE Sensitive     VANCOMYCIN <=0.5 SENSITIVE Sensitive     TRIMETH/SULFA <=10 SENSITIVE Sensitive     CLINDAMYCIN <=0.25 SENSITIVE Sensitive     RIFAMPIN <=0.5 SENSITIVE Sensitive     Inducible Clindamycin NEGATIVE Sensitive     * STAPHYLOCOCCUS AUREUS  Blood Culture ID Panel (Reflexed)     Status: Abnormal   Collection Time: 11/16/15  8:30 PM  Result Value Ref Range Status   Enterococcus species DETECTED (A) NOT DETECTED Final    Comment: CRITICAL RESULT CALLED TO, READ BACK BY AND VERIFIED WITH: L POWELL PHARMD 1800  11/17/15 A BROWNING    Vancomycin resistance NOT DETECTED NOT DETECTED Final   Listeria monocytogenes NOT DETECTED NOT DETECTED Final   Staphylococcus species DETECTED (A) NOT DETECTED Final    Comment: CRITICAL RESULT CALLED TO, READ BACK BY AND VERIFIED WITH: L POWELL PHARMD 1800 11/17/15 A BROWNING    Staphylococcus aureus DETECTED (A) NOT DETECTED Final    Comment: CRITICAL RESULT CALLED TO, READ BACK BY AND VERIFIED WITH: L POWELL PHARMD 1800 11/17/15 A BROWNING    Methicillin resistance NOT DETECTED NOT DETECTED Final   Streptococcus species NOT DETECTED NOT DETECTED Final   Streptococcus agalactiae NOT DETECTED NOT DETECTED Final   Streptococcus pneumoniae NOT DETECTED NOT DETECTED Final   Streptococcus pyogenes NOT DETECTED NOT DETECTED Final   Acinetobacter baumannii NOT DETECTED NOT DETECTED Final   Enterobacteriaceae species NOT DETECTED NOT DETECTED Final   Enterobacter cloacae complex NOT DETECTED NOT DETECTED Final   Escherichia coli NOT DETECTED NOT DETECTED Final   Klebsiella oxytoca NOT DETECTED NOT DETECTED Final   Klebsiella pneumoniae NOT DETECTED NOT DETECTED Final   Proteus species NOT DETECTED NOT DETECTED Final   Serratia marcescens NOT DETECTED NOT DETECTED Final   Carbapenem resistance NOT DETECTED NOT DETECTED Final  Haemophilus influenzae NOT DETECTED NOT DETECTED Final   Neisseria meningitidis NOT DETECTED NOT DETECTED Final   Pseudomonas aeruginosa NOT DETECTED NOT DETECTED Final   Candida albicans NOT DETECTED NOT DETECTED Final   Candida glabrata NOT DETECTED NOT DETECTED Final   Candida krusei NOT DETECTED NOT DETECTED Final   Candida parapsilosis NOT DETECTED NOT DETECTED Final   Candida tropicalis NOT DETECTED NOT DETECTED Final    Comment: Performed at [facility]  Urine culture     Status: Abnormal   Collection Time: 11/16/15  8:50 PM  Result Value Ref Range Status   Specimen Description URINE, RANDOM  Final   Special Requests NONE   Final   Culture MULTIPLE SPECIES PRESENT, SUGGEST RECOLLECTION (A)  Final   Report Status 11/18/2015 FINAL  Final  Blood Culture (routine x 2)     Status: Abnormal   Collection Time: 11/16/15  9:30 PM  Result Value Ref Range Status   Specimen Description BLOOD LEFT FOREARM  Final   Special Requests BOTTLES DRAWN AEROBIC AND ANAEROBIC 5CC  Final   Culture  Setup Time   Final    GRAM POSITIVE COCCI IN CLUSTERS IN BOTH AEROBIC AND ANAEROBIC BOTTLES CRITICAL RESULT CALLED TO, READ BACK BY AND VERIFIED WITH: L POWELL PHARMD 1800 11/17/15 A BROWNING    Culture (A)  Final    STAPHYLOCOCCUS AUREUS SUSCEPTIBILITIES PERFORMED ON PREVIOUS CULTURE WITHIN THE LAST 5 DAYS. Performed at [facility]    Report Status 11/20/2015 FINAL  Final  MRSA PCR Screening     Status: Abnormal   Collection Time: 11/17/15 12:45 AM  Result Value Ref Range Status   MRSA by PCR POSITIVE (A) NEGATIVE Final    Comment:        The GeneXpert MRSA Assay (FDA approved for NASAL specimens only), is one component of a comprehensive MRSA colonization surveillance program. It is not intended to diagnose MRSA infection nor to guide or monitor treatment for MRSA infections. RESULT CALLED TO, READ BACK BY AND VERIFIED WITH: K ABDUSSURAN,RN @0440  11/17/15 MKELLY   Culture, blood (x 2)     Status: Abnormal   Collection Time: 11/17/15  2:35 AM  Result Value Ref Range Status   Specimen Description BLOOD BLOOD LEFT HAND  Final   Special Requests IN PEDIATRIC BOTTLE 1CC  Final   Culture  Setup Time   Final    GRAM POSITIVE COCCI IN CLUSTERS AEROBIC BOTTLE ONLY CRITICAL VALUE NOTED.  VALUE IS CONSISTENT WITH PREVIOUSLY REPORTED AND CALLED VALUE.    Culture (A)  Final    STAPHYLOCOCCUS AUREUS SUSCEPTIBILITIES PERFORMED ON PREVIOUS CULTURE WITHIN THE LAST 5 DAYS.    Report Status 11/20/2015 FINAL  Final  Culture, blood (x 2)     Status: Abnormal   Collection Time: 11/17/15  2:45 AM  Result Value Ref Range Status    Specimen Description BLOOD BLOOD RIGHT HAND  Final   Special Requests IN PEDIATRIC BOTTLE 2CC  Final   Culture  Setup Time   Final    GRAM POSITIVE COCCI IN CLUSTERS AEROBIC BOTTLE ONLY CRITICAL RESULT CALLED TO, READ BACK BY AND VERIFIED WITH: L POWELL PHARMD 1800 11/17/15 A BROWNING    Culture (A)  Final    STAPHYLOCOCCUS AUREUS SUSCEPTIBILITIES PERFORMED ON PREVIOUS CULTURE WITHIN THE LAST 5 DAYS.    Report Status 11/20/2015 FINAL  Final  Culture, blood (Routine X 2) w Reflex to ID Panel     Status: Abnormal   Collection Time: 11/18/15  7:10 AM  Result Value Ref Range Status   Specimen Description BLOOD RIGHT HAND  Final   Special Requests IN PEDIATRIC BOTTLE 3CC  Final   Culture  Setup Time   Final    GRAM POSITIVE COCCI IN CLUSTERS AEROBIC BOTTLE ONLY CRITICAL RESULT CALLED TO, READ BACK BY AND VERIFIED WITH: G ABBOTT PHARMD 2302 11/18/15 A BROWNING    Culture (A)  Final    STAPHYLOCOCCUS AUREUS SUSCEPTIBILITIES PERFORMED ON PREVIOUS CULTURE WITHIN THE LAST 5 DAYS. ENTEROCOCCUS FAECALIS    Report Status 11/21/2015 FINAL  Final   Organism ID, Bacteria ENTEROCOCCUS FAECALIS  Final      Susceptibility   Enterococcus faecalis - MIC*    AMPICILLIN <=2 SENSITIVE Sensitive     VANCOMYCIN 2 SENSITIVE Sensitive     GENTAMICIN SYNERGY SENSITIVE Sensitive     * ENTEROCOCCUS FAECALIS  Culture, blood (Routine X 2) w Reflex to ID Panel     Status: Abnormal   Collection Time: 11/18/15  8:41 AM  Result Value Ref Range Status   Specimen Description BLOOD LEFT ARM  Final   Special Requests IN PEDIATRIC BOTTLE 3CC  Final   Culture  Setup Time   Final    GRAM POSITIVE COCCI IN CLUSTERS IN PEDIATRIC BOTTLE CRITICAL RESULT CALLED TO, READ BACK BY AND VERIFIED WITH: G ABBOTT PHARMD 2302 11/18/15 A BROWNING    Culture (A)  Final    STAPHYLOCOCCUS AUREUS SUSCEPTIBILITIES PERFORMED ON PREVIOUS CULTURE WITHIN THE LAST 5 DAYS.    Report Status 11/20/2015 FINAL  Final  Culture, blood  (routine x 2)     Status: None   Collection Time: 11/19/15  1:25 PM  Result Value Ref Range Status   Specimen Description BLOOD RIGHT HAND  Final   Special Requests IN PEDIATRIC BOTTLE  Final   Culture NO GROWTH 5 DAYS  Final   Report Status 11/24/2015 FINAL  Final  Culture, blood (routine x 2)     Status: None   Collection Time: 11/19/15  1:28 PM  Result Value Ref Range Status   Specimen Description BLOOD LEFT HAND  Final   Special Requests IN PEDIATRIC BOTTLE  Final   Culture NO GROWTH 5 DAYS  Final   Report Status 11/24/2015 FINAL  Final  Urine culture     Status: Abnormal   Collection Time: 11/22/15  9:41 PM  Result Value Ref Range Status   Specimen Description URINE, RANDOM  Final   Special Requests NONE  Final   Culture <10,000 COLONIES/mL INSIGNIFICANT GROWTH (A)  Final   Report Status 11/24/2015 FINAL  Final  Culture, blood (Routine X 2) w Reflex to ID Panel     Status: None (Preliminary result)   Collection Time: 11/22/15 10:20 PM  Result Value Ref Range Status   Specimen Description BLOOD RIGHT FOREARM  Final   Special Requests IN PEDIATRIC BOTTLE 1CC  Final   Culture NO GROWTH 3 DAYS  Final   Report Status PENDING  Incomplete  Culture, blood (Routine X 2) w Reflex to ID Panel     Status: None (Preliminary result)   Collection Time: 11/22/15 10:39 PM  Result Value Ref Range Status   Specimen Description BLOOD LEFT HAND  Final   Special Requests IN PEDIATRIC BOTTLE 0.5CC  Final   Culture  Setup Time   Final    GRAM POSITIVE COCCI IN CLUSTERS AEROBIC BOTTLE ONLY CRITICAL VALUE NOTED.  VALUE IS CONSISTENT WITH PREVIOUSLY REPORTED AND CALLED VALUE.  Culture GRAM POSITIVE COCCI  Final   Report Status PENDING  Incomplete         Radiology Studies: No results found.      Scheduled Meds: . ampicillin-sulbactam (UNASYN) IV  3 g Intravenous Q6H  . cefTRIAXone (ROCEPHIN)  IV  2 g Intravenous Q12H  . feeding supplement  1 Container Oral BID BM  . folic  acid  1 mg Oral Daily  . magnesium oxide  400 mg Oral BID  . multivitamin with minerals  1 tablet Oral Daily  . nicotine  14 mg Transdermal Daily  . predniSONE  40 mg Oral Q breakfast  . sodium bicarbonate  650 mg Oral TID  . sodium chloride flush  3 mL Intravenous Q12H  . thiamine  100 mg Oral Daily   Continuous Infusions:    LOS: 10 days    Time spent: 35 minutes    Xavius Spadafore, MD [facility] Pager [phone]  If 7PM-7AM, please contact night-coverage [email or web address] Password [email or web address] 11/26/2015, 10:40 AM

## 2015-11-26 NOTE — Progress Notes (Signed)
Patient ID: Catherine DittyAngela N Hernandez, female   DOB: [birth year], 30 y.o.   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 I note that her last blood cultures on 11/22/2015 were still positive. I will repeat blood cultures today. Given the persistence of her bacteremia and large vegetations she needs 4 weeks of IV antibiotic therapy through 12/14/2015 at a skilled nursing facility. If that can be arranged I would then place a PICC. She is not a candidate for outpatient IV antibiotic therapy. Please call Dr. Enedina FinnerJeff Hatcher [phone]) for any infectious disease questions this weekend.         Cliffton AstersJohn Cassy Sprowl, MD [facility] [phone] pager   [phone] cell 04/06/2015, 1:32 PM

## 2015-11-26 NOTE — Clinical Social Work Note (Signed)
Clinical Social Work Assessment  Patient Details  Name: Catherine Hernandez MRN: [id number] Date of Birth: 1986/03/19  Date of referral:  11/25/15               Reason for consult:  Family Concerns, Substance Use/ETOH Abuse, Discharge Planning                Permission sought to share information with:  Facility Art therapist granted to share information::  No, Yes, Verbal Permission Granted  Name::        Agency::  SNFs  Relationship::     Contact Information:     Housing/Transportation Living arrangements for the past 2 months:  Apartment Source of Information:  Patient Patient Interpreter Needed:  None Criminal Activity/Legal Involvement Pertinent to Current Situation/Hospitalization:  No - Comment as needed Significant Relationships:  Significant Other, Dependent Children Lives with:  Self Do you feel safe going back to the place where you live?  Yes Need for family participation in patient care:  No (Coment)  Care giving concerns:  Patient is willing to go to SNF for IV antibiotics.   Social Worker assessment / plan:  CSW met with patient. CSW explained need of SNF placement for IV antibiotics. CSW explained SNF search and placement process to the patient and answered her questions.    Employment status:  Unemployed Forensic scientist:  Self Pay (Medicaid Pending) PT Recommendations:  Not assessed at this time Information / Referral to community resources:  [place]  Patient/Family's Response to care: The patient appears happy with the care she is receiving in hospital and is appreciative of CSW assistance.  Patient/Family's Understanding of and Emotional Response to Diagnosis, Current Treatment, and Prognosis:  The patient has a good understanding of why she was admitted. She understands the care plan and what she will need post discharge. Emotional Assessment Appearance:  Appears stated age Attitude/Demeanor/Rapport:   (Patient was  appropriate.) Affect (typically observed):  Accepting, Appropriate, Calm Orientation:  Oriented to Self, Oriented to Place, Oriented to  Time, Oriented to Situation Alcohol / Substance use:  Illicit Drugs Psych involvement (Current and /or in the community):  No (Comment)  Discharge Needs  Concerns to be addressed:  Substance Abuse Concerns Readmission within the last 30 days:  No Current discharge risk:  Substance Abuse, Chronically ill Barriers to Discharge:  Continued Medical Work up   [employer], LCSW 11/26/2015, 3:49 PM

## 2015-11-27 DIAGNOSIS — J189 Pneumonia, unspecified organism: Secondary | ICD-10-CM

## 2015-11-27 DIAGNOSIS — K089 Disorder of teeth and supporting structures, unspecified: Secondary | ICD-10-CM

## 2015-11-27 LAB — BASIC METABOLIC PANEL
ANION GAP: 7 (ref 5–15)
BUN: 11 mg/dL (ref 6–20)
CALCIUM: 7.3 mg/dL — AB (ref 8.9–10.3)
CO2: 27 mmol/L (ref 22–32)
Chloride: 105 mmol/L (ref 101–111)
Creatinine, Ser: 0.65 mg/dL (ref 0.44–1.00)
GFR calc non Af Amer: >60 mL/min (ref >60)
GLUCOSE: 90 mg/dL (ref 65–99)
POTASSIUM: 3.1 mmol/L — AB (ref 3.5–5.1)
Sodium: 139 mmol/L (ref 135–145)

## 2015-11-27 LAB — CBC
HEMATOCRIT: 26.1 % — AB (ref 36.0–46.0)
HEMOGLOBIN: 8.1 g/dL — AB (ref 12.0–15.0)
MCH: 27.1 pg (ref 26.0–34.0)
MCHC: 31 g/dL (ref 30.0–36.0)
MCV: 87.3 fL (ref 78.0–100.0)
Platelets: 191 10*3/uL (ref 150–400)
RBC: 2.99 MIL/uL — AB (ref 3.87–5.11)
RDW: 17.4 % — ABNORMAL HIGH (ref 11.5–15.5)
WBC: 10.3 10*3/uL (ref 4.0–10.5)

## 2015-11-27 LAB — CULTURE, BLOOD (ROUTINE X 2): Culture: NO GROWTH

## 2015-11-27 MED ORDER — POTASSIUM CHLORIDE CRYS ER 20 MEQ PO TBCR
40.0000 meq | EXTENDED_RELEASE_TABLET | ORAL | Status: AC
  Administered 2015-11-27 (×2): 40 meq via ORAL
  Filled 2015-11-27 (×2): qty 2

## 2015-11-27 NOTE — Progress Notes (Signed)
Pharmacy Antibiotic Note  Catherine Hernandez is a 30 y.o. female continues on unasyn and ceftriaxone for her MSSA and enterococcal tricuspid valve endocarditis. TEE on 8/21 revealed 2 large, very mobile vegetations on tricuspid valves. Renal function remains stable. Repeat blood cultures from 8/25 pending. Noted plan for 4 weeks of therapy  Plan: 1) Continue unasyn 3gm IV q6h 2) Continue ceftriaxone 2g IV q12h 3) Will follow renal function, cultures and clinical progress  Height: 5\' 4"  (162.6 cm) Weight: 114 lb 9.6 oz (52 kg) IBW/kg (Calculated) : 54.7  Temp (24hrs), Avg:98.5 F (36.9 C), Min:98.2 F (36.8 C), Max:98.7 F (37.1 C)   Recent Labs Lab 11/22/15 0238 11/23/15 0257 11/24/15 0251 11/25/15 0652 11/26/15 1155 11/27/15 0555  WBC 7.9 11.0* 10.9* 13.1*  --  10.3  CREATININE 1.68* 1.23* 1.08* 0.77 0.70 0.65    Estimated Creatinine Clearance: 85.9 mL/min (by C-G formula based on SCr of 0.8 mg/dL).    No Known Allergies  Antimicrobials this admission: Vanc 8/15 >>8/17 Zosyn 8/15 >>8/17 Unasyn 8/17>> Ceftriaxone 8/21>>  Microbiology results: 8/15  UCx: mult species, suggest recollect 8/15  BCx: MSSA 8/15  [employer] (+)enterococcus sp, Staph sp, MRSA neg 8/16  MRSA PCR (+) 8/16  BCx: Staph aureus  (MSSA) 8/17  BCx: Staph aureus, enterococcus f (pan-sens) 8/18  BCx: ng   8/21  BCx: 1/2 CoNS 8/25  BCx:   Catherine CasaJennifer Larwence Hernandez, PharmD, BCPS 11/27/2015 12:17 PM

## 2015-11-27 NOTE — Progress Notes (Signed)
PROGRESS NOTE    Catherine Hernandez  NWG:[id number] DOB: [birth year] DOA: 11/16/2015 PCP: No PCP Per Patient    Brief Narrative:  30 year old female with a history of polysubstance abuse (cocaine, heroin, tobacco, marijuana), asthma presented with increasing shortness of breath and coughing. Her symptoms were associated with diffuse myalgias and poor oral intake. Apparently, the patient's last heroin and cocaine use was approximately [facility] priorto admission. Upon presentation. The patient was noted to be febrile with a fever up to 101.33F, hypertension, and tachycardia. Code sepsis was activated, and the patient was started on intravenous fluids and intravenous antibiotics.The patient was found to have MRSA and Enterococcus faecalis bacteremia. ID was consulted to help manage antibiotics. The patient had tricuspid valve vegetation on TTE. TEE done on 11/23/15--see below.  Assessment & Plan:   Principal Problem:   Endocarditis of native valve Active Problems:   Sepsis (HCC)   CAP (community acquired pneumonia)   Polysubstance dependence including opioid type drug with complication, continuous use (HCC)   Opioid use with withdrawal (HCC)   Hyponatremia   Acute renal failure (HCC)   Hypocalcemia   Nonspecific abnormal electrocardiogram (ECG) (EKG)   Thrombocytopenia (HCC)   Enterococcal bacteremia   Staphylococcus aureus bacteremia   Normocytic anemia   Protein-calorie malnutrition (HCC)   Poor dentition   AKI (acute kidney injury) (HCC)   Malnutrition of moderate degree   Polysubstance abuse   Dyspnea   Tachypnea  Sepsis -due to bacteremia with metastatic infection -patient has pulmonary infiltrates which representsmetastatic infection from bacteremia -Saline lockIV fluids--sepsis physiology resolved -Continue Unasyn and IV Rocephin -lactic acid 2.16-->0.99 -procalcitonin 18.52 -11/22/15--new fever-->repeat blood culture, UA--neg to date 11/22/15-repeat CXR--interstitial  prominence-->lasix IV x 1 -11/23/15 TEE--2 large shaggy and very mobile vegetations on both leaflets of the TV -wean stress steroids to prednisone -: Continue empiric IV antibiotics.  Tachypnea -11/23/15 CXR--interstitial prominence-->lasix IV x 1 -in part due to metabolic acidosis--started bicarbonate po -98-100% on RA  MSSA & E faecalisBacteremia -abx per ID -requested TEE--scheduled for 11/22/15 -8/15, 8/16, 8/17--blood culture --positive -11/19/15 blood culture--neg @ 96hours -11/22/15 with 1/2 blood cultures positive sensitivities pending -PICC line when ok with ID -Repeat blood cultures ordered per ID 11/26/2015 pending  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%, tricuspid valve vegetations --11/23/15 TEE--2 large shaggy and very mobile vegetations on both leaflets of the TV. Patient has been assessed by cardiothoracic surgery, who feel no indication for dry scalp stated 5 and a repair at this time. Is felt patient's currently not a surgical candidate. Continue empiric IV antibiotics and will need follow-up 2-D echo towards the end of IV antibiotic course. ID following.  Pulmonary infiltrates/Septic emboli -V/Q scan--indeterminate -CT chest without contrast--multifocal nodular consolidations with cavitations likely septic emboli; RLL consolidation/wedge-shaped infarct -hemoptysis improved. Continue IV antibiotics.  Acute kidney injury -secondary to sepsis and hemodynamic changes -04/09/2013 serum creatinine 1.01 -11/16/2015 serum creatinine 4.43 -Renal ultrasound--bilateral echogenic kidneys; no hydronephrosis -UA with pyuria and proteinuria -check urine protein creatinine ratio--1.24 -improving  Hyponatremia -likely volume depletion -improved with IVF  Thrombocytopenia -due to sepsis -HIV antibody--neg -hep B and C--negative -improving with tx of infection  Hypokalemia/hypomagnesemia. -replete potassium. Keep magnesium greater than 2.  Magnesium today is 1.6. Replete magnesium.  Hypocalcemia -improved 25 vitamin D--20.4-->supplement  Hemoptysis -INR 1.45 -PTT 40 -trend Hgb-->drop in Hgb likely dilution--no other obvious source of blood loss -improved  Anemia of chronic disease -drop likely dilution -11/22/15--transfused 2 units PRBC     DVT prophylaxis: scd Code Status:  Full Family Communication: Updated patient. No family at bedside. Disposition Plan: Skilled nursing facility when medically stable and PICC line has been placed, and repeat blood culture negative.   Consultants:   Infectious diseases: Dr. Orvan Falconer 11/18/2015  CT surgery: Dr. Donata Clay 11/23/2015  Cardiology for TEE.  Procedures:   TEE 11/23/2015  Chest x-ray 11/16/2015, 11/19/2015, 11/22/2015  2 d echo 11/18/2015  Renal ultrasound 11/17/2015   CT Chest 11/17/2015  VQ scan 11/17/2015   Transfuse 2 units packed red blood cells a 21 2017      Antimicrobials:   IV Unasyn 11/18/2015  IV Rocephin 11/22/2015  IV Zosyn 11/16/2015>>>> 11/17/2015  IV Vancomycin 11/16/2015>>>>>11/18/2015   Subjective: Patient denies chest pain. No shortness of breath. Patient c/o back pain.  Objective: Vitals:   11/26/15 0548 11/26/15 1500 11/26/15 2022 11/27/15 0624  BP: 118/67 117/79 122/79 120/75  Pulse: 82 82 63 69  Resp: 20 19 19 19   Temp: 99.6 F (37.6 C) 98.7 F (37.1 C) 98.2 F (36.8 C) 98.7 F (37.1 C)  TempSrc: Oral Oral Oral Oral  SpO2: 97% 98% 100% 97%  Weight: 52 kg (114 lb 9.6 oz)     Height:        Intake/Output Summary (Last 24 hours) at 11/27/15 0959 Last data filed at 11/26/15 2029  Gross per 24 hour  Intake              820 ml  Output                0 ml  Net              820 ml   Filed Weights   11/24/15 0500 11/25/15 0533 11/26/15 0548  Weight: 50.1 kg (110 lb 7.2 oz) 52.2 kg (115 lb 1.3 oz) 52 kg (114 lb 9.6 oz)    Examination:  General exam: Appears calm and comfortable. Respiratory system:  Clear to auscultation. Respiratory effort normal. Cardiovascular system: S1 & S2 heard, RRR. No JVD, murmurs, rubs, gallops or clicks. No pedal edema. Gastrointestinal system: Abdomen is nondistended, soft and nontender. No organomegaly or masses felt. Normal bowel sounds heard. Central nervous system: Alert and oriented. No focal neurological deficits. Extremities: Symmetric 5 x 5 power. Skin: No rashes, lesions or ulcers Psychiatry: Judgement and insight appear normal. Mood & affect appropriate.     Data Reviewed: I have personally reviewed following labs and imaging studies  CBC:  Recent Labs Lab 11/22/15 0238 11/22/15 2238 11/23/15 0257 11/24/15 0251 11/25/15 0652 11/27/15 0555  WBC 7.9  --  11.0* 10.9* 13.1* 10.3  HGB 6.8* 9.1* 8.9* 8.8* 9.1* 8.1*  HCT 22.0* 27.5* 27.1* 27.3* 28.7* 26.1*  MCV 83.3  --  83.4 85.0 86.7 87.3  PLT 152  --  152 145* 203 191   Basic Metabolic Panel:  Recent Labs Lab 11/23/15 0257 11/24/15 0251 11/25/15 0652 11/26/15 1155 11/27/15 0555  NA 139 140 136 138 139  K 3.3* 4.3 3.3* 3.4* 3.1*  CL 113* 113* 106 106 105  CO2 19* 16* 23 26 27   GLUCOSE 139* 174* 84 116* 90  BUN 21* 23* 13 9 11   CREATININE 1.23* 1.08* 0.77 0.70 0.65  CALCIUM 7.0* 6.7* 7.3* 6.8* 7.3*  MG 1.3* 1.3* 1.6* 1.6*  --    GFR: Estimated Creatinine Clearance: 85.9 mL/min (by C-G formula based on SCr of 0.8 mg/dL). Liver Function Tests: No results for input(s): AST, ALT, ALKPHOS, BILITOT, PROT, ALBUMIN in the last 168 hours. No  results for input(s): LIPASE, AMYLASE in the last 168 hours. No results for input(s): AMMONIA in the last 168 hours. Coagulation Profile: No results for input(s): INR, PROTIME in the last 168 hours. Cardiac Enzymes: No results for input(s): CKTOTAL, CKMB, CKMBINDEX, TROPONINI in the last 168 hours. BNP (last 3 results) No results for input(s): PROBNP in the last 8760 hours. HbA1C: No results for input(s): HGBA1C in the last 72  hours. CBG: No results for input(s): GLUCAP in the last 168 hours. Lipid Profile: No results for input(s): CHOL, HDL, LDLCALC, TRIG, CHOLHDL, LDLDIRECT in the last 72 hours. Thyroid Function Tests: No results for input(s): TSH, T4TOTAL, FREET4, T3FREE, THYROIDAB in the last 72 hours. Anemia Panel: No results for input(s): VITAMINB12, FOLATE, FERRITIN, TIBC, IRON, RETICCTPCT in the last 72 hours. Sepsis Labs: No results for input(s): PROCALCITON, LATICACIDVEN in the last 168 hours.  Recent Results (from the past 240 hour(s))  Culture, blood (Routine X 2) w Reflex to ID Panel     Status: Abnormal   Collection Time: 11/18/15  7:10 AM  Result Value Ref Range Status   Specimen Description BLOOD RIGHT HAND  Final   Special Requests IN PEDIATRIC BOTTLE 3CC  Final   Culture  Setup Time   Final    GRAM POSITIVE COCCI IN CLUSTERS AEROBIC BOTTLE ONLY CRITICAL RESULT CALLED TO, READ BACK BY AND VERIFIED WITH: G ABBOTT PHARMD 2302 11/18/15 A BROWNING    Culture (A)  Final    STAPHYLOCOCCUS AUREUS SUSCEPTIBILITIES PERFORMED ON PREVIOUS CULTURE WITHIN THE LAST 5 DAYS. ENTEROCOCCUS FAECALIS    Report Status 11/21/2015 FINAL  Final   Organism ID, Bacteria ENTEROCOCCUS FAECALIS  Final      Susceptibility   Enterococcus faecalis - MIC*    AMPICILLIN <=2 SENSITIVE Sensitive     VANCOMYCIN 2 SENSITIVE Sensitive     GENTAMICIN SYNERGY SENSITIVE Sensitive     * ENTEROCOCCUS FAECALIS  Culture, blood (Routine X 2) w Reflex to ID Panel     Status: Abnormal   Collection Time: 11/18/15  8:41 AM  Result Value Ref Range Status   Specimen Description BLOOD LEFT ARM  Final   Special Requests IN PEDIATRIC BOTTLE 3CC  Final   Culture  Setup Time   Final    GRAM POSITIVE COCCI IN CLUSTERS IN PEDIATRIC BOTTLE CRITICAL RESULT CALLED TO, READ BACK BY AND VERIFIED WITH: G ABBOTT PHARMD 2302 11/18/15 A BROWNING    Culture (A)  Final    STAPHYLOCOCCUS AUREUS SUSCEPTIBILITIES PERFORMED ON PREVIOUS CULTURE WITHIN  THE LAST 5 DAYS.    Report Status 11/20/2015 FINAL  Final  Culture, blood (routine x 2)     Status: None   Collection Time: 11/19/15  1:25 PM  Result Value Ref Range Status   Specimen Description BLOOD RIGHT HAND  Final   Special Requests IN PEDIATRIC BOTTLE 3ML  Final   Culture NO GROWTH 5 DAYS  Final   Report Status 11/24/2015 FINAL  Final  Culture, blood (routine x 2)     Status: None   Collection Time: 11/19/15  1:28 PM  Result Value Ref Range Status   Specimen Description BLOOD LEFT HAND  Final   Special Requests IN PEDIATRIC BOTTLE 3ML  Final   Culture NO GROWTH 5 DAYS  Final   Report Status 11/24/2015 FINAL  Final  Urine culture     Status: Abnormal   Collection Time: 11/22/15  9:41 PM  Result Value Ref Range Status   Specimen Description URINE, RANDOM  Final   Special Requests NONE  Final   Culture <10,000 COLONIES/mL INSIGNIFICANT GROWTH (A)  Final   Report Status 11/24/2015 FINAL  Final  Culture, blood (Routine X 2) w Reflex to ID Panel     Status: None (Preliminary result)   Collection Time: 11/22/15 10:20 PM  Result Value Ref Range Status   Specimen Description BLOOD RIGHT FOREARM  Final   Special Requests IN PEDIATRIC BOTTLE 1CC  Final   Culture NO GROWTH 4 DAYS  Final   Report Status PENDING  Incomplete  Culture, blood (Routine X 2) w Reflex to ID Panel     Status: Abnormal   Collection Time: 11/22/15 10:39 PM  Result Value Ref Range Status   Specimen Description BLOOD LEFT HAND  Final   Special Requests IN PEDIATRIC BOTTLE 0.5CC  Final   Culture  Setup Time   Final    GRAM POSITIVE COCCI IN CLUSTERS AEROBIC BOTTLE ONLY CRITICAL VALUE NOTED.  VALUE IS CONSISTENT WITH PREVIOUSLY REPORTED AND CALLED VALUE.    Culture (A)  Final    STAPHYLOCOCCUS SPECIES (COAGULASE NEGATIVE) THE SIGNIFICANCE OF ISOLATING THIS ORGANISM FROM A SINGLE SET OF BLOOD CULTURES WHEN MULTIPLE SETS ARE DRAWN IS UNCERTAIN. PLEASE NOTIFY THE MICROBIOLOGY DEPARTMENT WITHIN ONE WEEK IF  SPECIATION AND SENSITIVITIES ARE REQUIRED.    Report Status 11/26/2015 FINAL  Final         Radiology Studies: No results found.      Scheduled Meds: . ampicillin-sulbactam (UNASYN) IV  3 g Intravenous Q6H  . cefTRIAXone (ROCEPHIN)  IV  2 g Intravenous Q12H  . feeding supplement  1 Container Oral BID BM  . folic acid  1 mg Oral Daily  . magnesium oxide  400 mg Oral BID  . multivitamin with minerals  1 tablet Oral Daily  . nicotine  14 mg Transdermal Daily  . potassium chloride  40 mEq Oral Q4H  . predniSONE  40 mg Oral Q breakfast  . sodium bicarbonate  650 mg Oral TID  . sodium chloride flush  3 mL Intravenous Q12H  . thiamine  100 mg Oral Daily   Continuous Infusions:    LOS: 11 days    Time spent: 35 minutes    Rodgers Likes, MD [facility] Pager [phone]  If 7PM-7AM, please contact night-coverage [email or web address] Password [email or web address] 11/27/2015, 9:59 AM

## 2015-11-28 LAB — CBC WITH DIFFERENTIAL/PLATELET
BASOS ABS: 0 10*3/uL (ref 0.0–0.1)
BASOS PCT: 0 %
EOS ABS: 0 10*3/uL (ref 0.0–0.7)
EOS PCT: 0 %
HCT: 27.5 % — ABNORMAL LOW (ref 36.0–46.0)
HEMOGLOBIN: 8.6 g/dL — AB (ref 12.0–15.0)
LYMPHS ABS: 1.3 10*3/uL (ref 0.7–4.0)
Lymphocytes Relative: 14 %
MCH: 27.2 pg (ref 26.0–34.0)
MCHC: 31.3 g/dL (ref 30.0–36.0)
MCV: 87 fL (ref 78.0–100.0)
Monocytes Absolute: 0.8 10*3/uL (ref 0.1–1.0)
Monocytes Relative: 8 %
NEUTROS PCT: 78 %
Neutro Abs: 7.6 10*3/uL (ref 1.7–7.7)
PLATELETS: 206 10*3/uL (ref 150–400)
RBC: 3.16 MIL/uL — AB (ref 3.87–5.11)
RDW: 17.6 % — ABNORMAL HIGH (ref 11.5–15.5)
WBC: 9.7 10*3/uL (ref 4.0–10.5)

## 2015-11-28 LAB — BASIC METABOLIC PANEL
ANION GAP: 6 (ref 5–15)
BUN: 11 mg/dL (ref 6–20)
CO2: 23 mmol/L (ref 22–32)
Calcium: 7.4 mg/dL — ABNORMAL LOW (ref 8.9–10.3)
Chloride: 109 mmol/L (ref 101–111)
Creatinine, Ser: 0.69 mg/dL (ref 0.44–1.00)
Glucose, Bld: 99 mg/dL (ref 65–99)
POTASSIUM: 3.5 mmol/L (ref 3.5–5.1)
SODIUM: 138 mmol/L (ref 135–145)

## 2015-11-28 MED ORDER — IBUPROFEN 800 MG PO TABS
800.0000 mg | ORAL_TABLET | Freq: Three times a day (TID) | ORAL | Status: DC | PRN
  Administered 2015-11-28: 800 mg via ORAL
  Filled 2015-11-28: qty 1

## 2015-11-28 MED ORDER — POTASSIUM CHLORIDE CRYS ER 20 MEQ PO TBCR
40.0000 meq | EXTENDED_RELEASE_TABLET | Freq: Once | ORAL | Status: AC
  Administered 2015-11-28: 40 meq via ORAL
  Filled 2015-11-28: qty 2

## 2015-11-28 MED ORDER — PREDNISONE 20 MG PO TABS
20.0000 mg | ORAL_TABLET | Freq: Every day | ORAL | Status: DC
  Administered 2015-11-29 – 2015-12-02 (×4): 20 mg via ORAL
  Filled 2015-11-28 (×4): qty 1

## 2015-11-28 MED ORDER — PANTOPRAZOLE SODIUM 40 MG PO TBEC
40.0000 mg | DELAYED_RELEASE_TABLET | Freq: Every day | ORAL | Status: DC
  Administered 2015-11-28 – 2015-12-02 (×5): 40 mg via ORAL
  Filled 2015-11-28 (×5): qty 1

## 2015-11-28 NOTE — Progress Notes (Signed)
PROGRESS NOTE    Catherine Hernandez  ZOX:[id number] DOB: Nov 10, 1985 DOA: 11/16/2015 PCP: No PCP Per Patient    Brief Narrative:  30 year old female with a history of polysubstance abuse (cocaine, heroin, tobacco, marijuana), asthma presented with increasing shortness of breath and coughing. Her symptoms were associated with diffuse myalgias and poor oral intake. Apparently, the patient's last heroin and cocaine use was approximately [facility] priorto admission. Upon presentation. The patient was noted to be febrile with a fever up to 101.64F, hypertension, and tachycardia. Code sepsis was activated, and the patient was started on intravenous fluids and intravenous antibiotics.The patient was found to have MRSA and Enterococcus faecalis bacteremia. ID was consulted to help manage antibiotics. The patient had tricuspid valve vegetation on TTE. TEE done on 11/23/15--see below.  Assessment & Plan:   Principal Problem:   Endocarditis of native valve Active Problems:   Sepsis (HCC)   CAP (community acquired pneumonia)   Polysubstance dependence including opioid type drug with complication, continuous use (HCC)   Opioid use with withdrawal (HCC)   Hyponatremia   Acute renal failure (HCC)   Hypocalcemia   Nonspecific abnormal electrocardiogram (ECG) (EKG)   Thrombocytopenia (HCC)   Enterococcal bacteremia   Staphylococcus aureus bacteremia   Normocytic anemia   Protein-calorie malnutrition (HCC)   Poor dentition   AKI (acute kidney injury) (HCC)   Malnutrition of moderate degree   Polysubstance abuse   Dyspnea   Tachypnea  Sepsis -due to bacteremia with metastatic infection -patient has pulmonary infiltrates which representsmetastatic infection from bacteremia -Saline lockIV fluids--sepsis physiology resolved -Continue Unasyn and IV Rocephin -lactic acid 2.16-->0.99 -procalcitonin 18.52 -11/22/15--new fever-->repeat blood culture, UA--neg to date 11/22/15-repeat CXR--interstitial  prominence-->lasix IV x 1 -11/23/15 TEE--2 large shaggy and very mobile vegetations on both leaflets of the TV -wean stress steroids to prednisone and taper. -: Continue empiric IV antibiotics.  Tachypnea -11/23/15 CXR--interstitial prominence-->lasix IV x 1 -in part due to metabolic acidosis--started bicarbonate po. Discontinue oral bicarbonate tablets. -98-100% on RA  MSSA & E faecalisBacteremia -abx per ID -requested TEE--scheduled for 11/22/15 -8/15, 8/16, 8/17--blood culture --positive -11/19/15 blood culture--neg @ 96hours -11/22/15 with 1/2 blood cultures positive for coagulase negative staph likely a contaminant. -Repeat blood cultures ordered per ID 11/26/2015 pending with no growth to date. -If blood cultures remain negative tomorrow will place PICC line.  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%, tricuspid valve vegetations --11/23/15 TEE--2 large shaggy and very mobile vegetations on both leaflets of the TV. Patient has been assessed by cardiothoracic surgery, who feel no indication for dry scalp stated 5 and a repair at this time. Is felt patient's currently not a surgical candidate. Continue empiric IV antibiotics and will need follow-up 2-D echo towards the end of IV antibiotic course. ID following.  Pulmonary infiltrates/Septic emboli -V/Q scan--indeterminate -CT chest without contrast--multifocal nodular consolidations with cavitations likely septic emboli; RLL consolidation/wedge-shaped infarct -hemoptysis improved. Continue IV antibiotics.  Acute kidney injury -secondary to sepsis and hemodynamic changes -04/09/2013 serum creatinine 1.01 -11/16/2015 serum creatinine 4.43 -Renal ultrasound--bilateral echogenic kidneys; no hydronephrosis -UA with pyuria and proteinuria -check urine protein creatinine ratio--1.24 -Resolved.  Hyponatremia -likely volume depletion -improved with IVF  Thrombocytopenia -due to sepsis -HIV antibody--neg -hep B  and C--negative -improving with tx of infection  Hypokalemia/hypomagnesemia. -replete potassium. Keep magnesium greater than 2.  Hypocalcemia -improved 25 vitamin D--20.4-->supplement  Hemoptysis -INR 1.45 -PTT 40 -trend Hgb-->drop in Hgb likely dilution--no other obvious source of blood loss -improved  Anemia of chronic disease -drop likely  dilution -11/22/15--transfused 2 units PRBC     DVT prophylaxis: scd Code Status: Full Family Communication: Updated patient. No family at bedside. Disposition Plan: Skilled nursing facility when medically stable and PICC line has been placed, and repeat blood culture negative.   Consultants:   Infectious diseases: Dr. Orvan Falconer 11/18/2015  CT surgery: Dr. Donata Clay 11/23/2015  Cardiology for TEE.  Procedures:   TEE 11/23/2015  Chest x-ray 11/16/2015, 11/19/2015, 11/22/2015  2 d echo 11/18/2015  Renal ultrasound 11/17/2015   CT Chest 11/17/2015  VQ scan 11/17/2015   Transfuse 2 units packed red blood cells a 21 2017      Antimicrobials:   IV Unasyn 11/18/2015  IV Rocephin 11/22/2015  IV Zosyn 11/16/2015>>>> 11/17/2015  IV Vancomycin 11/16/2015>>>>>11/18/2015   Subjective: Patient denies chest pain. No shortness of breath. Patient c/o back pain.  Objective: Vitals:   11/27/15 1830 11/27/15 1945 11/28/15 0448 11/28/15 0946  BP: 136/78 128/79 136/90 116/78  Pulse: 61 65 97 91  Resp: 18 18 20 20   Temp: 98.3 F (36.8 C) 98.5 F (36.9 C) (!) 100.7 F (38.2 C) 97.9 F (36.6 C)  TempSrc: Oral Oral Oral Oral  SpO2: 98% 97% 96% 96%  Weight:      Height:        Intake/Output Summary (Last 24 hours) at 11/28/15 1106 Last data filed at 11/28/15 0921  Gross per 24 hour  Intake             1330 ml  Output                0 ml  Net             1330 ml   Filed Weights   11/24/15 0500 11/25/15 0533 11/26/15 0548  Weight: 50.1 kg (110 lb 7.2 oz) 52.2 kg (115 lb 1.3 oz) 52 kg (114 lb 9.6 oz)     Examination:  General exam: Appears calm and comfortable. Respiratory system: Clear to auscultation anterior lung fields. Respiratory effort normal. Cardiovascular system: S1 & S2 heard, RRR. No JVD, murmurs, rubs, gallops or clicks. No pedal edema. Gastrointestinal system: Abdomen is nondistended, soft and nontender. No organomegaly or masses felt. Normal bowel sounds heard. Central nervous system: Alert and oriented. No focal neurological deficits. Extremities: Symmetric 5 x 5 power. Skin: No rashes, lesions or ulcers Psychiatry: Judgement and insight appear normal. Mood & affect appropriate.     Data Reviewed: I have personally reviewed following labs and imaging studies  CBC:  Recent Labs Lab 11/22/15 0238 11/22/15 2238 11/23/15 0257 11/24/15 0251 11/25/15 0652 11/27/15 0555  WBC 7.9  --  11.0* 10.9* 13.1* 10.3  HGB 6.8* 9.1* 8.9* 8.8* 9.1* 8.1*  HCT 22.0* 27.5* 27.1* 27.3* 28.7* 26.1*  MCV 83.3  --  83.4 85.0 86.7 87.3  PLT 152  --  152 145* 203 191   Basic Metabolic Panel:  Recent Labs Lab 11/23/15 0257 11/24/15 0251 11/25/15 0652 11/26/15 1155 11/27/15 0555 11/28/15 0252  NA 139 140 136 138 139 138  K 3.3* 4.3 3.3* 3.4* 3.1* 3.5  CL 113* 113* 106 106 105 109  CO2 19* 16* 23 26 27 23   GLUCOSE 139* 174* 84 116* 90 99  BUN 21* 23* 13 9 11 11   CREATININE 1.23* 1.08* 0.77 0.70 0.65 0.69  CALCIUM 7.0* 6.7* 7.3* 6.8* 7.3* 7.4*  MG 1.3* 1.3* 1.6* 1.6*  --   --    GFR: Estimated Creatinine Clearance: 85.9 mL/min (by C-G formula based  on SCr of 0.8 mg/dL). Liver Function Tests: No results for input(s): AST, ALT, ALKPHOS, BILITOT, PROT, ALBUMIN in the last 168 hours. No results for input(s): LIPASE, AMYLASE in the last 168 hours. No results for input(s): AMMONIA in the last 168 hours. Coagulation Profile: No results for input(s): INR, PROTIME in the last 168 hours. Cardiac Enzymes: No results for input(s): CKTOTAL, CKMB, CKMBINDEX, TROPONINI in the last  168 hours. BNP (last 3 results) No results for input(s): PROBNP in the last 8760 hours. HbA1C: No results for input(s): HGBA1C in the last 72 hours. CBG: No results for input(s): GLUCAP in the last 168 hours. Lipid Profile: No results for input(s): CHOL, HDL, LDLCALC, TRIG, CHOLHDL, LDLDIRECT in the last 72 hours. Thyroid Function Tests: No results for input(s): TSH, T4TOTAL, FREET4, T3FREE, THYROIDAB in the last 72 hours. Anemia Panel: No results for input(s): VITAMINB12, FOLATE, FERRITIN, TIBC, IRON, RETICCTPCT in the last 72 hours. Sepsis Labs: No results for input(s): PROCALCITON, LATICACIDVEN in the last 168 hours.  Recent Results (from the past 240 hour(s))  Culture, blood (routine x 2)     Status: None   Collection Time: 11/19/15  1:25 PM  Result Value Ref Range Status   Specimen Description BLOOD RIGHT HAND  Final   Special Requests IN PEDIATRIC BOTTLE 3ML  Final   Culture NO GROWTH 5 DAYS  Final   Report Status 11/24/2015 FINAL  Final  Culture, blood (routine x 2)     Status: None   Collection Time: 11/19/15  1:28 PM  Result Value Ref Range Status   Specimen Description BLOOD LEFT HAND  Final   Special Requests IN PEDIATRIC BOTTLE 3ML  Final   Culture NO GROWTH 5 DAYS  Final   Report Status 11/24/2015 FINAL  Final  Urine culture     Status: Abnormal   Collection Time: 11/22/15  9:41 PM  Result Value Ref Range Status   Specimen Description URINE, RANDOM  Final   Special Requests NONE  Final   Culture <10,000 COLONIES/mL INSIGNIFICANT GROWTH (A)  Final   Report Status 11/24/2015 FINAL  Final  Culture, blood (Routine X 2) w Reflex to ID Panel     Status: None   Collection Time: 11/22/15 10:20 PM  Result Value Ref Range Status   Specimen Description BLOOD RIGHT FOREARM  Final   Special Requests IN PEDIATRIC BOTTLE 1CC  Final   Culture NO GROWTH 5 DAYS  Final   Report Status 11/27/2015 FINAL  Final  Culture, blood (Routine X 2) w Reflex to ID Panel     Status:  Abnormal   Collection Time: 11/22/15 10:39 PM  Result Value Ref Range Status   Specimen Description BLOOD LEFT HAND  Final   Special Requests IN PEDIATRIC BOTTLE 0.5CC  Final   Culture  Setup Time   Final    GRAM POSITIVE COCCI IN CLUSTERS AEROBIC BOTTLE ONLY CRITICAL VALUE NOTED.  VALUE IS CONSISTENT WITH PREVIOUSLY REPORTED AND CALLED VALUE.    Culture (A)  Final    STAPHYLOCOCCUS SPECIES (COAGULASE NEGATIVE) THE SIGNIFICANCE OF ISOLATING THIS ORGANISM FROM A SINGLE SET OF BLOOD CULTURES WHEN MULTIPLE SETS ARE DRAWN IS UNCERTAIN. PLEASE NOTIFY THE MICROBIOLOGY DEPARTMENT WITHIN ONE WEEK IF SPECIATION AND SENSITIVITIES ARE REQUIRED.    Report Status 11/26/2015 FINAL  Final  Culture, blood (routine x 2)     Status: None (Preliminary result)   Collection Time: 11/26/15 12:00 PM  Result Value Ref Range Status   Specimen Description BLOOD LEFT  ANTECUBITAL  Final   Special Requests IN PEDIATRIC BOTTLE  3CC  Final   Culture NO GROWTH 1 DAY  Final   Report Status PENDING  Incomplete  Culture, blood (routine x 2)     Status: None (Preliminary result)   Collection Time: 11/26/15 12:10 PM  Result Value Ref Range Status   Specimen Description BLOOD LEFT ARM  Final   Special Requests IN PEDIATRIC BOTTLE  3CC  Final   Culture NO GROWTH 1 DAY  Final   Report Status PENDING  Incomplete         Radiology Studies: No results found.      Scheduled Meds: . ampicillin-sulbactam (UNASYN) IV  3 g Intravenous Q6H  . cefTRIAXone (ROCEPHIN)  IV  2 g Intravenous Q12H  . feeding supplement  1 Container Oral BID BM  . folic acid  1 mg Oral Daily  . magnesium oxide  400 mg Oral BID  . multivitamin with minerals  1 tablet Oral Daily  . nicotine  14 mg Transdermal Daily  . pantoprazole  40 mg Oral Q0600  . predniSONE  40 mg Oral Q breakfast  . sodium chloride flush  3 mL Intravenous Q12H  . thiamine  100 mg Oral Daily   Continuous Infusions:    LOS: 12 days    Time spent: 35  minutes    Promise Weldin, MD [facility] Pager [phone]  If 7PM-7AM, please contact night-coverage [email or web address] Password [email or web address] 11/28/2015, 11:06 AM

## 2015-11-29 ENCOUNTER — Inpatient Hospital Stay (HOSPITAL_COMMUNITY): Payer: Self-pay

## 2015-11-29 DIAGNOSIS — R0682 Tachypnea, not elsewhere classified: Secondary | ICD-10-CM

## 2015-11-29 DIAGNOSIS — E871 Hypo-osmolality and hyponatremia: Secondary | ICD-10-CM

## 2015-11-29 DIAGNOSIS — D649 Anemia, unspecified: Secondary | ICD-10-CM

## 2015-11-29 DIAGNOSIS — A4101 Sepsis due to Methicillin susceptible Staphylococcus aureus: Principal | ICD-10-CM

## 2015-11-29 LAB — CBC
HEMATOCRIT: 26.9 % — AB (ref 36.0–46.0)
HEMOGLOBIN: 8.3 g/dL — AB (ref 12.0–15.0)
MCH: 27.4 pg (ref 26.0–34.0)
MCHC: 30.9 g/dL (ref 30.0–36.0)
MCV: 88.8 fL (ref 78.0–100.0)
Platelets: 203 10*3/uL (ref 150–400)
RBC: 3.03 MIL/uL — AB (ref 3.87–5.11)
RDW: 18 % — ABNORMAL HIGH (ref 11.5–15.5)
WBC: 9 10*3/uL (ref 4.0–10.5)

## 2015-11-29 LAB — BASIC METABOLIC PANEL
ANION GAP: 8 (ref 5–15)
BUN: 10 mg/dL (ref 6–20)
CHLORIDE: 107 mmol/L (ref 101–111)
CO2: 23 mmol/L (ref 22–32)
Calcium: 7.7 mg/dL — ABNORMAL LOW (ref 8.9–10.3)
Creatinine, Ser: 0.57 mg/dL (ref 0.44–1.00)
GFR calc Af Amer: >60 mL/min (ref >60)
GLUCOSE: 91 mg/dL (ref 65–99)
POTASSIUM: 4.1 mmol/L (ref 3.5–5.1)
Sodium: 138 mmol/L (ref 135–145)

## 2015-11-29 NOTE — Progress Notes (Signed)
Patient ID: Catherine Hernandez, female   DOB: 1985-04-15, 30 y.o.   MRN: [id number]          [facility]    Date of Admission:  11/16/2015   Total days of antibiotics 13         Principal Problem:   Endocarditis of native valve Active Problems:   Sepsis (HCC)   CAP (community acquired pneumonia)   Enterococcal bacteremia   Staphylococcus aureus bacteremia   Polysubstance dependence including opioid type drug with complication, continuous use (HCC)   Opioid use with withdrawal (HCC)   Hyponatremia   Acute renal failure (HCC)   Hypocalcemia   Nonspecific abnormal electrocardiogram (ECG) (EKG)   Thrombocytopenia (HCC)   Normocytic anemia   Protein-calorie malnutrition (HCC)   Poor dentition   AKI (acute kidney injury) (HCC)   Malnutrition of moderate degree   Polysubstance abuse   Dyspnea   Tachypnea   . ampicillin-sulbactam (UNASYN) IV  3 g Intravenous Q6H  . cefTRIAXone (ROCEPHIN)  IV  2 g Intravenous Q12H  . feeding supplement  1 Container Oral BID BM  . folic acid  1 mg Oral Daily  . magnesium oxide  400 mg Oral BID  . multivitamin with minerals  1 tablet Oral Daily  . nicotine  14 mg Transdermal Daily  . pantoprazole  40 mg Oral Q0600  . predniSONE  20 mg Oral Q breakfast  . sodium chloride flush  3 mL Intravenous Q12H  . thiamine  100 mg Oral Daily    SUBJECTIVE: She is feeling better.  Review of Systems: Review of Systems  Constitutional: Negative for chills, diaphoresis and fever.  Respiratory: Negative for cough, sputum production and shortness of breath.   Cardiovascular: Negative for chest pain.    Past Medical History:  Diagnosis Date  . CAP (community acquired pneumonia) 11/16/2015  . Childhood asthma   . Heroin abuse   . Pneumonia 2007  . Polysubstance abuse     Social History  Substance Use Topics  . Smoking status: Current Every Day Smoker    Packs/day: 0.50    Years: 10.00    Types: Cigarettes  . Smokeless  tobacco: Never Used  . Alcohol use No    Family History  Problem Relation Age of Onset  . Anesthesia problems Neg Hx   . Hypotension Neg Hx   . Malignant hyperthermia Neg Hx   . Pseudochol deficiency Neg Hx    No Known Allergies  OBJECTIVE: Vitals:   11/28/15 1400 11/28/15 1955 11/29/15 0500 11/29/15 0611  BP: 125/84 (!) 132/93  (!) 139/91  Pulse: 80   82  Resp: 18 20  20   Temp: 98.4 F (36.9 C) 97.7 F (36.5 C)  99.2 F (37.3 C)  TempSrc: Oral Oral  Oral  SpO2: 99% 99%  99%  Weight:   103 lb (46.7 kg)   Height:       Body mass index is 17.68 kg/m.  Physical Exam  Constitutional: She is oriented to person, place, and time.  She is resting quietly in bed.  Cardiovascular: Normal rate and regular rhythm.   No murmur heard. Pulmonary/Chest: Effort normal and breath sounds normal. She has no wheezes. She has no rales.  Neurological: She is alert and oriented to person, place, and time.  Psychiatric: Mood and affect normal.    Lab Results Lab Results  Component Value Date   WBC 9.0 11/29/2015   HGB 8.3 (L) 11/29/2015   HCT  26.9 (L) 11/29/2015   MCV 88.8 11/29/2015   PLT 203 11/29/2015    Lab Results  Component Value Date   CREATININE 0.57 11/29/2015   BUN 10 11/29/2015   NA 138 11/29/2015   K 4.1 11/29/2015   CL 107 11/29/2015   CO2 23 11/29/2015    Lab Results  Component Value Date   ALT 11 (L) 11/18/2015   AST 23 11/18/2015   ALKPHOS 50 11/18/2015   BILITOT 0.5 11/18/2015     Microbiology: Recent Results (from the past 240 hour(s))  Culture, blood (routine x 2)     Status: None   Collection Time: 11/19/15  1:25 PM  Result Value Ref Range Status   Specimen Description BLOOD RIGHT HAND  Final   Special Requests IN PEDIATRIC BOTTLE  Final   Culture NO GROWTH 5 DAYS  Final   Report Status 11/24/2015 FINAL  Final  Culture, blood (routine x 2)     Status: None   Collection Time: 11/19/15  1:28 PM  Result Value Ref Range Status   Specimen  Description BLOOD LEFT HAND  Final   Special Requests IN PEDIATRIC BOTTLE  Final   Culture NO GROWTH 5 DAYS  Final   Report Status 11/24/2015 FINAL  Final  Urine culture     Status: Abnormal   Collection Time: 11/22/15  9:41 PM  Result Value Ref Range Status   Specimen Description URINE, RANDOM  Final   Special Requests NONE  Final   Culture <10,000 COLONIES/mL INSIGNIFICANT GROWTH (A)  Final   Report Status 11/24/2015 FINAL  Final  Culture, blood (Routine X 2) w Reflex to ID Panel     Status: None   Collection Time: 11/22/15 10:20 PM  Result Value Ref Range Status   Specimen Description BLOOD RIGHT FOREARM  Final   Special Requests IN PEDIATRIC BOTTLE 1CC  Final   Culture NO GROWTH 5 DAYS  Final   Report Status 11/27/2015 FINAL  Final  Culture, blood (Routine X 2) w Reflex to ID Panel     Status: Abnormal   Collection Time: 11/22/15 10:39 PM  Result Value Ref Range Status   Specimen Description BLOOD LEFT HAND  Final   Special Requests IN PEDIATRIC BOTTLE 0.5CC  Final   Culture  Setup Time   Final    GRAM POSITIVE COCCI IN CLUSTERS AEROBIC BOTTLE ONLY CRITICAL VALUE NOTED.  VALUE IS CONSISTENT WITH PREVIOUSLY REPORTED AND CALLED VALUE.    Culture (A)  Final    STAPHYLOCOCCUS SPECIES (COAGULASE NEGATIVE) THE SIGNIFICANCE OF ISOLATING THIS ORGANISM FROM A SINGLE SET OF BLOOD CULTURES WHEN MULTIPLE SETS ARE DRAWN IS UNCERTAIN. PLEASE NOTIFY THE MICROBIOLOGY DEPARTMENT WITHIN ONE WEEK IF SPECIATION AND SENSITIVITIES ARE REQUIRED.    Report Status 11/26/2015 FINAL  Final  Culture, blood (routine x 2)     Status: None (Preliminary result)   Collection Time: 11/26/15 12:00 PM  Result Value Ref Range Status   Specimen Description BLOOD LEFT ANTECUBITAL  Final   Special Requests IN PEDIATRIC BOTTLE  3CC  Final   Culture NO GROWTH 2 DAYS  Final   Report Status PENDING  Incomplete  Culture, blood (routine x 2)     Status: None (Preliminary result)   Collection Time: 11/26/15 12:10  PM  Result Value Ref Range Status   Specimen Description BLOOD LEFT ARM  Final   Special Requests IN PEDIATRIC BOTTLE  3CC  Final   Culture NO GROWTH 2 DAYS  Final  Report Status PENDING  Incomplete     ASSESSMENT: She is improving on 2 drug therapy for MSSA and enterococcal tricuspid valve endocarditis. She needs 15 more days of IV antibiotic therapy through 12/14/2015.  PLAN: 1. Okay to place PICC now 2. Continue current antibiotics through 12/14/2015 3. I will arrange follow-up in my clinic next month 4. I will sign off now but please call if I can be of further assistance while she is here  Cliffton Asters, MD [facility] [phone] pager   [phone] cell 11/29/2015, 10:23 AM

## 2015-11-29 NOTE — Progress Notes (Signed)
PROGRESS NOTE    Catherine Hernandez  WJX:[id number] DOB: 10/20/1985 DOA: 11/16/2015 PCP: No PCP Per Patient    Brief Narrative:  30 year old female with a history of polysubstance abuse (cocaine, heroin, tobacco, marijuana), asthma presented with increasing shortness of breath and coughing. Her symptoms were associated with diffuse myalgias and poor oral intake. Apparently, the patient's last heroin and cocaine use was approximately [facility] priorto admission. Upon presentation. The patient was noted to be febrile with a fever up to 101.1F, hypertension, and tachycardia. Code sepsis was activated, and the patient was started on intravenous fluids and intravenous antibiotics.The patient was found to have MRSA and Enterococcus faecalis bacteremia. ID was consulted to help manage antibiotics. The patient had tricuspid valve vegetation on TTE. TEE done on 11/23/15--see below.  Assessment & Plan:   Principal Problem:   Endocarditis of native valve Active Problems:   Sepsis (HCC)   CAP (community acquired pneumonia)   Polysubstance dependence including opioid type drug with complication, continuous use (HCC)   Opioid use with withdrawal (HCC)   Hyponatremia   Acute renal failure (HCC)   Hypocalcemia   Nonspecific abnormal electrocardiogram (ECG) (EKG)   Thrombocytopenia (HCC)   Enterococcal bacteremia   Staphylococcus aureus bacteremia   Normocytic anemia   Protein-calorie malnutrition (HCC)   Poor dentition   AKI (acute kidney injury) (HCC)   Malnutrition of moderate degree   Polysubstance abuse   Dyspnea   Tachypnea  Sepsis -due to bacteremia with metastatic infection -patient has pulmonary infiltrates which representsmetastatic infection from bacteremia -Saline lockIV fluids--sepsis physiology resolved -Continue Unasyn and IV Rocephin -lactic acid 2.16-->0.99 -procalcitonin 18.52 -11/22/15--new fever-->repeat blood culture, UA--neg to date 11/22/15-repeat CXR--interstitial  prominence-->lasix IV x 1 -11/23/15 TEE--2 large shaggy and very mobile vegetations on both leaflets of the TV -wean stress steroids to prednisone and taper. -: Continue empiric IV antibiotics.  Tachypnea -11/23/15 CXR--interstitial prominence-->lasix IV x 1 -in part due to metabolic acidosis--started bicarbonate po. Discontinued oral bicarbonate tablets. -98-100% on RA  MSSA & E faecalisBacteremia -abx per ID -requested TEE--scheduled for 11/22/15 -8/15, 8/16, 8/17--blood culture --positive -11/19/15 blood culture--neg @ 96hours -11/22/15 with 1/2 blood cultures positive for coagulase negative staph likely a contaminant. -Repeat blood cultures ordered per ID 11/26/2015 pending with no growth to date. -Place PICC line.  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%, tricuspid valve vegetations --11/23/15 TEE--2 large shaggy and very mobile vegetations on both leaflets of the TV. Patient has been assessed by cardiothoracic surgery, who feel no indication for dry scalp stated 5 and a repair at this time. Is felt patient's currently not a surgical candidate. Continue empiric IV antibiotics and will need follow-up 2-D echo towards the end of IV antibiotic course. ID following.  Pulmonary infiltrates/Septic emboli -V/Q scan--indeterminate -CT chest without contrast--multifocal nodular consolidations with cavitations likely septic emboli; RLL consolidation/wedge-shaped infarct -hemoptysis improved. Continue IV antibiotics.  Acute kidney injury -secondary to sepsis and hemodynamic changes -04/09/2013 serum creatinine 1.01 -11/16/2015 serum creatinine 4.43 -Renal ultrasound--bilateral echogenic kidneys; no hydronephrosis -UA with pyuria and proteinuria -check urine protein creatinine ratio--1.24 -Resolved.  Hyponatremia -likely volume depletion -improved with IVF  Thrombocytopenia -due to sepsis -HIV antibody--neg -hep B and C--negative -improving with tx of  infection  Hypokalemia/hypomagnesemia. -replete potassium. Keep magnesium greater than 2.  Hypocalcemia -improved 25 vitamin D--20.4-->supplement  Hemoptysis -INR 1.45 -PTT 40 -trend Hgb-->drop in Hgb likely dilution--no other obvious source of blood loss -improved  Anemia of chronic disease -drop likely dilution -11/22/15--transfused 2 units PRBC -h/h stable.  DVT prophylaxis: scd Code Status: Full Family Communication: Updated patient. No family at bedside. Disposition Plan: Skilled nursing facility when medically stable and PICC line has been placed, and repeat blood culture negative.   Consultants:   Infectious diseases: Dr. Orvan Falconer 11/18/2015  CT surgery: Dr. Donata Clay 11/23/2015  Cardiology for TEE.  Procedures:   TEE 11/23/2015  Chest x-ray 11/16/2015, 11/19/2015, 11/22/2015  2 d echo 11/18/2015  Renal ultrasound 11/17/2015   CT Chest 11/17/2015  VQ scan 11/17/2015   Transfuse 2 units packed red blood cells 11/22/2015  PICC     Antimicrobials:   IV Unasyn 11/18/2015  IV Rocephin 11/22/2015  IV Zosyn 11/16/2015>>>> 11/17/2015  IV Vancomycin 11/16/2015>>>>>11/18/2015   Subjective: Patient denies chest pain. No shortness of breath. Diaphoretic.  Objective: Vitals:   11/28/15 1400 11/28/15 1955 11/29/15 0500 11/29/15 0611  BP: 125/84 (!) 132/93  (!) 139/91  Pulse: 80   82  Resp: 18 20  20   Temp: 98.4 F (36.9 C) 97.7 F (36.5 C)  99.2 F (37.3 C)  TempSrc: Oral Oral  Oral  SpO2: 99% 99%  99%  Weight:   46.7 kg (103 lb)   Height:        Intake/Output Summary (Last 24 hours) at 11/29/15 1045 Last data filed at 11/29/15 0800  Gross per 24 hour  Intake              240 ml  Output                0 ml  Net              240 ml   Filed Weights   11/25/15 0533 11/26/15 0548 11/29/15 0500  Weight: 52.2 kg (115 lb 1.3 oz) 52 kg (114 lb 9.6 oz) 46.7 kg (103 lb)    Examination:  General exam: Appears calm and  comfortable.Diaphoretic. Respiratory system: Clear to auscultation anterior lung fields. Respiratory effort normal. Cardiovascular system: S1 & S2 heard, RRR. No JVD, murmurs, rubs, gallops or clicks. No pedal edema. Gastrointestinal system: Abdomen is nondistended, soft and nontender. No organomegaly or masses felt. Normal bowel sounds heard. Central nervous system: Alert and oriented. No focal neurological deficits. Extremities: Symmetric 5 x 5 power. Skin: No rashes, lesions or ulcers Psychiatry: Judgement and insight appear normal. Mood & affect appropriate.     Data Reviewed: I have personally reviewed following labs and imaging studies  CBC:  Recent Labs Lab 11/24/15 0251 11/25/15 0652 11/27/15 0555 11/28/15 1950 11/29/15 0300  WBC 10.9* 13.1* 10.3 9.7 9.0  NEUTROABS  --   --   --  7.6  --   HGB 8.8* 9.1* 8.1* 8.6* 8.3*  HCT 27.3* 28.7* 26.1* 27.5* 26.9*  MCV 85.0 86.7 87.3 87.0 88.8  PLT 145* 203 191 206 203   Basic Metabolic Panel:  Recent Labs Lab 11/23/15 0257 11/24/15 0251 11/25/15 0652 11/26/15 1155 11/27/15 0555 11/28/15 0252 11/29/15 0300  NA 139 140 136 138 139 138 138  K 3.3* 4.3 3.3* 3.4* 3.1* 3.5 4.1  CL 113* 113* 106 106 105 109 107  CO2 19* 16* 23 26 27 23 23   GLUCOSE 139* 174* 84 116* 90 99 91  BUN 21* 23* 13 9 11 11 10   CREATININE 1.23* 1.08* 0.77 0.70 0.65 0.69 0.57  CALCIUM 7.0* 6.7* 7.3* 6.8* 7.3* 7.4* 7.7*  MG 1.3* 1.3* 1.6* 1.6*  --   --   --    GFR: Estimated Creatinine Clearance: 77.2 mL/min (by  C-G formula based on SCr of 0.8 mg/dL). Liver Function Tests: No results for input(s): AST, ALT, ALKPHOS, BILITOT, PROT, ALBUMIN in the last 168 hours. No results for input(s): LIPASE, AMYLASE in the last 168 hours. No results for input(s): AMMONIA in the last 168 hours. Coagulation Profile: No results for input(s): INR, PROTIME in the last 168 hours. Cardiac Enzymes: No results for input(s): CKTOTAL, CKMB, CKMBINDEX, TROPONINI in the  last 168 hours. BNP (last 3 results) No results for input(s): PROBNP in the last 8760 hours. HbA1C: No results for input(s): HGBA1C in the last 72 hours. CBG: No results for input(s): GLUCAP in the last 168 hours. Lipid Profile: No results for input(s): CHOL, HDL, LDLCALC, TRIG, CHOLHDL, LDLDIRECT in the last 72 hours. Thyroid Function Tests: No results for input(s): TSH, T4TOTAL, FREET4, T3FREE, THYROIDAB in the last 72 hours. Anemia Panel: No results for input(s): VITAMINB12, FOLATE, FERRITIN, TIBC, IRON, RETICCTPCT in the last 72 hours. Sepsis Labs: No results for input(s): PROCALCITON, LATICACIDVEN in the last 168 hours.  Recent Results (from the past 240 hour(s))  Culture, blood (routine x 2)     Status: None   Collection Time: 11/19/15  1:25 PM  Result Value Ref Range Status   Specimen Description BLOOD RIGHT HAND  Final   Special Requests IN PEDIATRIC BOTTLE 3ML  Final   Culture NO GROWTH 5 DAYS  Final   Report Status 11/24/2015 FINAL  Final  Culture, blood (routine x 2)     Status: None   Collection Time: 11/19/15  1:28 PM  Result Value Ref Range Status   Specimen Description BLOOD LEFT HAND  Final   Special Requests IN PEDIATRIC BOTTLE 3ML  Final   Culture NO GROWTH 5 DAYS  Final   Report Status 11/24/2015 FINAL  Final  Urine culture     Status: Abnormal   Collection Time: 11/22/15  9:41 PM  Result Value Ref Range Status   Specimen Description URINE, RANDOM  Final   Special Requests NONE  Final   Culture <10,000 COLONIES/mL INSIGNIFICANT GROWTH (A)  Final   Report Status 11/24/2015 FINAL  Final  Culture, blood (Routine X 2) w Reflex to ID Panel     Status: None   Collection Time: 11/22/15 10:20 PM  Result Value Ref Range Status   Specimen Description BLOOD RIGHT FOREARM  Final   Special Requests IN PEDIATRIC BOTTLE 1CC  Final   Culture NO GROWTH 5 DAYS  Final   Report Status 11/27/2015 FINAL  Final  Culture, blood (Routine X 2) w Reflex to ID Panel     Status:  Abnormal   Collection Time: 11/22/15 10:39 PM  Result Value Ref Range Status   Specimen Description BLOOD LEFT HAND  Final   Special Requests IN PEDIATRIC BOTTLE 0.5CC  Final   Culture  Setup Time   Final    GRAM POSITIVE COCCI IN CLUSTERS AEROBIC BOTTLE ONLY CRITICAL VALUE NOTED.  VALUE IS CONSISTENT WITH PREVIOUSLY REPORTED AND CALLED VALUE.    Culture (A)  Final    STAPHYLOCOCCUS SPECIES (COAGULASE NEGATIVE) THE SIGNIFICANCE OF ISOLATING THIS ORGANISM FROM A SINGLE SET OF BLOOD CULTURES WHEN MULTIPLE SETS ARE DRAWN IS UNCERTAIN. PLEASE NOTIFY THE MICROBIOLOGY DEPARTMENT WITHIN ONE WEEK IF SPECIATION AND SENSITIVITIES ARE REQUIRED.    Report Status 11/26/2015 FINAL  Final  Culture, blood (routine x 2)     Status: None (Preliminary result)   Collection Time: 11/26/15 12:00 PM  Result Value Ref Range Status   Specimen  Description BLOOD LEFT ANTECUBITAL  Final   Special Requests IN PEDIATRIC BOTTLE  3CC  Final   Culture NO GROWTH 2 DAYS  Final   Report Status PENDING  Incomplete  Culture, blood (routine x 2)     Status: None (Preliminary result)   Collection Time: 11/26/15 12:10 PM  Result Value Ref Range Status   Specimen Description BLOOD LEFT ARM  Final   Special Requests IN PEDIATRIC BOTTLE  3CC  Final   Culture NO GROWTH 2 DAYS  Final   Report Status PENDING  Incomplete         Radiology Studies: No results found.      Scheduled Meds: . ampicillin-sulbactam (UNASYN) IV  3 g Intravenous Q6H  . cefTRIAXone (ROCEPHIN)  IV  2 g Intravenous Q12H  . feeding supplement  1 Container Oral BID BM  . folic acid  1 mg Oral Daily  . magnesium oxide  400 mg Oral BID  . multivitamin with minerals  1 tablet Oral Daily  . nicotine  14 mg Transdermal Daily  . pantoprazole  40 mg Oral Q0600  . predniSONE  20 mg Oral Q breakfast  . sodium chloride flush  3 mL Intravenous Q12H  . thiamine  100 mg Oral Daily   Continuous Infusions:    LOS: 13 days    Time spent: 35  minutes    Clytee Heinrich, MD [facility] Pager [phone]  If 7PM-7AM, please contact night-coverage [email or web address] Password [email or web address] 11/29/2015, 10:45 AM

## 2015-11-29 NOTE — Progress Notes (Signed)
Spoke with Tiffany, RN to see about placing PICC this afternoon.  Informed that PICC team needs to wait until SNF placement has been made before placing.  Patient has adequate IV access at this time.  Gasper LloydKerry Haneefah Venturini, RN [facility]

## 2015-11-30 DIAGNOSIS — F1920 Other psychoactive substance dependence, uncomplicated: Secondary | ICD-10-CM

## 2015-11-30 DIAGNOSIS — B9561 Methicillin susceptible Staphylococcus aureus infection as the cause of diseases classified elsewhere: Secondary | ICD-10-CM

## 2015-11-30 DIAGNOSIS — N179 Acute kidney failure, unspecified: Secondary | ICD-10-CM

## 2015-11-30 DIAGNOSIS — E440 Moderate protein-calorie malnutrition: Secondary | ICD-10-CM

## 2015-11-30 DIAGNOSIS — I38 Endocarditis, valve unspecified: Secondary | ICD-10-CM

## 2015-11-30 DIAGNOSIS — B952 Enterococcus as the cause of diseases classified elsewhere: Secondary | ICD-10-CM

## 2015-11-30 DIAGNOSIS — R7881 Bacteremia: Secondary | ICD-10-CM

## 2015-11-30 LAB — BASIC METABOLIC PANEL
ANION GAP: 8 (ref 5–15)
BUN: 12 mg/dL (ref 6–20)
CALCIUM: 8.1 mg/dL — AB (ref 8.9–10.3)
CO2: 24 mmol/L (ref 22–32)
Chloride: 104 mmol/L (ref 101–111)
Creatinine, Ser: 0.59 mg/dL (ref 0.44–1.00)
Glucose, Bld: 89 mg/dL (ref 65–99)
Potassium: 4.1 mmol/L (ref 3.5–5.1)
Sodium: 136 mmol/L (ref 135–145)

## 2015-11-30 MED ORDER — SODIUM CHLORIDE 0.9% FLUSH
10.0000 mL | INTRAVENOUS | Status: DC | PRN
  Administered 2015-12-01: 10 mL
  Filled 2015-11-30: qty 40

## 2015-11-30 NOTE — Progress Notes (Signed)
Pharmacy Antibiotic Note  Catherine Hernandez is a 30 y.o. female Continues on Unasyn for MSSA bacteremia/IE. Blood cx also showed enterococcus on 2 cx's so ceftriaxone was added. Afebrile, WBC wnl. TEE done which shows endocarditis. ID plans for IV abx through 9/12. SCr stable, CrCl ~7670mlmin.  Plan: Continue Unasyn 3g IV Q6 Continue ceftriaxone 2g IV Q12 per MD Monitor clinical picture, renal function Abx through at least 9/12  Height: 5\' 4"  (162.6 cm) Weight: 98 lb 12.3 oz (44.8 kg) IBW/kg (Calculated) : 54.7  Temp (24hrs), Avg:99.2 F (37.3 C), Min:98.3 F (36.8 C), Max:100.2 F (37.9 C)   Recent Labs Lab 11/24/15 0251 11/25/15 0652 11/26/15 1155 11/27/15 0555 11/28/15 0252 11/28/15 1950 11/29/15 0300 11/30/15 0306  WBC 10.9* 13.1*  --  10.3  --  9.7 9.0  --   CREATININE 1.08* 0.77 0.70 0.65 0.69  --  0.57 0.59    Estimated Creatinine Clearance: 74 mL/min (by C-G formula based on SCr of 0.8 mg/dL).    No Known Allergies  Antimicrobials this admission: Vanc 8/15 >>8/17 Zosyn 8/15 >>8/17 Unasyn 8/17 >> Ceftriaxone 8/21 >>  Microbiology results: 8/15  UCx: mult species, suggest recollect 8/15  BCx: MSSA 8/15  [employer] (+)enterococcus sp, Staph sp, MRSA neg 8/16  MRSA PCR (+) 8/16  BCx: Staph aureus  (MSSA) 8/17  BCx: Staph aureus, enterococcus f (pan-sens) 8/18  BCx: ng   8/21  BCx: 1/2 CoNS 8/25  BCx: ngtd  Enzo BiNathan Jorden Minchey, PharmD, [facility] Indiana PcBCPS Clinical Pharmacist Pager [phone] 11/30/2015 11:27 AM

## 2015-11-30 NOTE — Progress Notes (Signed)
PROGRESS NOTE    Catherine Hernandez  ZOX:[id number] DOB: February 13, 1986 DOA: 11/16/2015 PCP: No PCP Per Patient    Brief Narrative:  30 year old female with a history of polysubstance abuse (cocaine, heroin, tobacco, marijuana), asthma presented with increasing shortness of breath and coughing. Her symptoms were associated with diffuse myalgias and poor oral intake. Apparently, the patient's last heroin and cocaine use was approximately [facility] priorto admission. Upon presentation. The patient was noted to be febrile with a fever up to 101.27F, hypertension, and tachycardia. Code sepsis was activated, and the patient was started on intravenous fluids and intravenous antibiotics.The patient was found to have MRSA and Enterococcus faecalis bacteremia. ID was consulted to help manage antibiotics. The patient had tricuspid valve vegetation on TTE. TEE done on 11/23/15--see below.  Assessment & Plan:   Principal Problem:   Endocarditis of native valve Active Problems:   Sepsis (HCC)   CAP (community acquired pneumonia)   Polysubstance dependence including opioid type drug with complication, continuous use (HCC)   Opioid use with withdrawal (HCC)   Hyponatremia   Acute renal failure (HCC)   Hypocalcemia   Nonspecific abnormal electrocardiogram (ECG) (EKG)   Thrombocytopenia (HCC)   Enterococcal bacteremia   Staphylococcus aureus bacteremia   Normocytic anemia   Protein-calorie malnutrition (HCC)   Poor dentition   AKI (acute kidney injury) (HCC)   Malnutrition of moderate degree   Polysubstance abuse   Dyspnea   Tachypnea  Sepsis -due to bacteremia with metastatic infection -patient has pulmonary infiltrates which representsmetastatic infection from bacteremia -Saline lockIV fluids--sepsis physiology resolved -Continue Unasyn and IV Rocephin -lactic acid 2.16-->0.99 -procalcitonin 18.52 -11/22/15--new fever-->repeat blood culture, UA--neg to date 11/22/15-repeat CXR--interstitial  prominence-->lasix IV x 1 -11/23/15 TEE--2 large shaggy and very mobile vegetations on both leaflets of the TV -wean stress steroids to prednisone and taper. -: Continue empiric IV antibiotics.  Tachypnea -11/23/15 CXR--interstitial prominence-->lasix IV x 1 -in part due to metabolic acidosis--started bicarbonate po. Discontinued oral bicarbonate tablets. -98-100% on RA  MSSA & E faecalisBacteremia -abx per ID -requested TEE--scheduled for 11/22/15 -8/15, 8/16, 8/17--blood culture --positive -11/19/15 blood culture--neg @ 96hours -11/22/15 with 1/2 blood cultures positive for coagulase negative staph likely a contaminant. -Repeat blood cultures ordered per ID 11/26/2015 pending with no growth to date. -Place PICC line.  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%, tricuspid valve vegetations --11/23/15 TEE--2 large shaggy and very mobile vegetations on both leaflets of the TV. Patient has been assessed by cardiothoracic surgery, who feel no indication for dry scalp stated 5 and a repair at this time. Is felt patient's currently not a surgical candidate. Continue empiric IV antibiotics and will need follow-up 2-D echo towards the end of IV antibiotic course. ID following.  Pulmonary infiltrates/Septic emboli -V/Q scan--indeterminate -CT chest without contrast--multifocal nodular consolidations with cavitations likely septic emboli; RLL consolidation/wedge-shaped infarct -hemoptysis improved. Continue IV antibiotics.  Acute kidney injury -secondary to sepsis and hemodynamic changes -04/09/2013 serum creatinine 1.01 -11/16/2015 serum creatinine 4.43 -Renal ultrasound--bilateral echogenic kidneys; no hydronephrosis -UA with pyuria and proteinuria -check urine protein creatinine ratio--1.24 -Resolved.  Hyponatremia -likely volume depletion -improved with IVF  Thrombocytopenia -due to sepsis -HIV antibody--neg -hep B and C--negative -improving with tx of  infection  Hypokalemia/hypomagnesemia. -replete potassium. Keep magnesium greater than 2.  Hypocalcemia -improved 25 vitamin D--20.4-->supplement  Hemoptysis -INR 1.45 -PTT 40 -trend Hgb-->drop in Hgb likely dilution--no other obvious source of blood loss -improved  Anemia of chronic disease -drop likely dilution -11/22/15--transfused 2 units PRBC -h/h stable.  DVT prophylaxis: scd Code Status: Full Family Communication: Updated patient. No family at bedside. Disposition Plan: Skilled nursing facility when bed available after PICC line has been placed.    Consultants:   Infectious diseases: Dr. Orvan Falconer 11/18/2015  CT surgery: Dr. Donata Clay 11/23/2015  Cardiology for TEE.  Procedures:   TEE 11/23/2015  Chest x-ray 11/16/2015, 11/19/2015, 11/22/2015  2 d echo 11/18/2015  Renal ultrasound 11/17/2015   CT Chest 11/17/2015  VQ scan 11/17/2015   Transfuse 2 units packed red blood cells 11/22/2015  PICC pending      Antimicrobials:   IV Unasyn 11/18/2015  IV Rocephin 11/22/2015  IV Zosyn 11/16/2015>>>> 11/17/2015  IV Vancomycin 11/16/2015>>>>>11/18/2015   Subjective: Patient denies chest pain. No shortness of breath. Sleeping easily arousable.  Objective: Vitals:   11/29/15 0611 11/29/15 1329 11/29/15 2217 11/30/15 0253  BP: (!) 139/91 113/79 (!) 129/91 (!) 131/93  Pulse: 82 77 82 (!) 107  Resp: 20 18 18 18   Temp: 99.2 F (37.3 C) 98.3 F (36.8 C) 99.2 F (37.3 C) 100.2 F (37.9 C)  TempSrc: Oral Oral Oral Oral  SpO2: 99% 99% 98% 97%  Weight:    44.8 kg (98 lb 12.3 oz)  Height:        Intake/Output Summary (Last 24 hours) at 11/30/15 1221 Last data filed at 11/30/15 0900  Gross per 24 hour  Intake              960 ml  Output                0 ml  Net              960 ml   Filed Weights   11/26/15 0548 11/29/15 0500 11/30/15 0253  Weight: 52 kg (114 lb 9.6 oz) 46.7 kg (103 lb) 44.8 kg (98 lb 12.3 oz)    Examination:  General  exam: Appears calm and comfortable. Respiratory system: Clear to auscultation anterior lung fields. Respiratory effort normal. Cardiovascular system: S1 & S2 heard, RRR. No JVD, murmurs, rubs, gallops or clicks. No pedal edema. Gastrointestinal system: Abdomen is nondistended, soft and nontender. No organomegaly or masses felt. Normal bowel sounds heard. Central nervous system: Alert and oriented. No focal neurological deficits. Extremities: Symmetric 5 x 5 power. Skin: No rashes, lesions or ulcers Psychiatry: Judgement and insight appear normal. Mood & affect appropriate.     Data Reviewed: I have personally reviewed following labs and imaging studies  CBC:  Recent Labs Lab 11/24/15 0251 11/25/15 0652 11/27/15 0555 11/28/15 1950 11/29/15 0300  WBC 10.9* 13.1* 10.3 9.7 9.0  NEUTROABS  --   --   --  7.6  --   HGB 8.8* 9.1* 8.1* 8.6* 8.3*  HCT 27.3* 28.7* 26.1* 27.5* 26.9*  MCV 85.0 86.7 87.3 87.0 88.8  PLT 145* 203 191 206 203   Basic Metabolic Panel:  Recent Labs Lab 11/24/15 0251 11/25/15 0652 11/26/15 1155 11/27/15 0555 11/28/15 0252 11/29/15 0300 11/30/15 0306  NA 140 136 138 139 138 138 136  K 4.3 3.3* 3.4* 3.1* 3.5 4.1 4.1  CL 113* 106 106 105 109 107 104  CO2 16* 23 26 27 23 23 24   GLUCOSE 174* 84 116* 90 99 91 89  BUN 23* 13 9 11 11 10 12   CREATININE 1.08* 0.77 0.70 0.65 0.69 0.57 0.59  CALCIUM 6.7* 7.3* 6.8* 7.3* 7.4* 7.7* 8.1*  MG 1.3* 1.6* 1.6*  --   --   --   --  GFR: Estimated Creatinine Clearance: 74 mL/min (by C-G formula based on SCr of 0.8 mg/dL). Liver Function Tests: No results for input(s): AST, ALT, ALKPHOS, BILITOT, PROT, ALBUMIN in the last 168 hours. No results for input(s): LIPASE, AMYLASE in the last 168 hours. No results for input(s): AMMONIA in the last 168 hours. Coagulation Profile: No results for input(s): INR, PROTIME in the last 168 hours. Cardiac Enzymes: No results for input(s): CKTOTAL, CKMB, CKMBINDEX, TROPONINI in the  last 168 hours. BNP (last 3 results) No results for input(s): PROBNP in the last 8760 hours. HbA1C: No results for input(s): HGBA1C in the last 72 hours. CBG: No results for input(s): GLUCAP in the last 168 hours. Lipid Profile: No results for input(s): CHOL, HDL, LDLCALC, TRIG, CHOLHDL, LDLDIRECT in the last 72 hours. Thyroid Function Tests: No results for input(s): TSH, T4TOTAL, FREET4, T3FREE, THYROIDAB in the last 72 hours. Anemia Panel: No results for input(s): VITAMINB12, FOLATE, FERRITIN, TIBC, IRON, RETICCTPCT in the last 72 hours. Sepsis Labs: No results for input(s): PROCALCITON, LATICACIDVEN in the last 168 hours.  Recent Results (from the past 240 hour(s))  Urine culture     Status: Abnormal   Collection Time: 11/22/15  9:41 PM  Result Value Ref Range Status   Specimen Description URINE, RANDOM  Final   Special Requests NONE  Final   Culture <10,000 COLONIES/mL INSIGNIFICANT GROWTH (A)  Final   Report Status 11/24/2015 FINAL  Final  Culture, blood (Routine X 2) w Reflex to ID Panel     Status: None   Collection Time: 11/22/15 10:20 PM  Result Value Ref Range Status   Specimen Description BLOOD RIGHT FOREARM  Final   Special Requests IN PEDIATRIC BOTTLE 1CC  Final   Culture NO GROWTH 5 DAYS  Final   Report Status 11/27/2015 FINAL  Final  Culture, blood (Routine X 2) w Reflex to ID Panel     Status: Abnormal   Collection Time: 11/22/15 10:39 PM  Result Value Ref Range Status   Specimen Description BLOOD LEFT HAND  Final   Special Requests IN PEDIATRIC BOTTLE 0.5CC  Final   Culture  Setup Time   Final    GRAM POSITIVE COCCI IN CLUSTERS AEROBIC BOTTLE ONLY CRITICAL VALUE NOTED.  VALUE IS CONSISTENT WITH PREVIOUSLY REPORTED AND CALLED VALUE.    Culture (A)  Final    STAPHYLOCOCCUS SPECIES (COAGULASE NEGATIVE) THE SIGNIFICANCE OF ISOLATING THIS ORGANISM FROM A SINGLE SET OF BLOOD CULTURES WHEN MULTIPLE SETS ARE DRAWN IS UNCERTAIN. PLEASE NOTIFY THE MICROBIOLOGY  DEPARTMENT WITHIN ONE WEEK IF SPECIATION AND SENSITIVITIES ARE REQUIRED.    Report Status 11/26/2015 FINAL  Final  Culture, blood (routine x 2)     Status: None (Preliminary result)   Collection Time: 11/26/15 12:00 PM  Result Value Ref Range Status   Specimen Description BLOOD LEFT ANTECUBITAL  Final   Special Requests IN PEDIATRIC BOTTLE  3CC  Final   Culture NO GROWTH 4 DAYS  Final   Report Status PENDING  Incomplete  Culture, blood (routine x 2)     Status: None (Preliminary result)   Collection Time: 11/26/15 12:10 PM  Result Value Ref Range Status   Specimen Description BLOOD LEFT ARM  Final   Special Requests IN PEDIATRIC BOTTLE  3CC  Final   Culture NO GROWTH 4 DAYS  Final   Report Status PENDING  Incomplete         Radiology Studies: No results found.      Scheduled Meds: .  ampicillin-sulbactam (UNASYN) IV  3 g Intravenous Q6H  . cefTRIAXone (ROCEPHIN)  IV  2 g Intravenous Q12H  . feeding supplement  1 Container Oral BID BM  . folic acid  1 mg Oral Daily  . magnesium oxide  400 mg Oral BID  . multivitamin with minerals  1 tablet Oral Daily  . nicotine  14 mg Transdermal Daily  . pantoprazole  40 mg Oral Q0600  . predniSONE  20 mg Oral Q breakfast  . sodium chloride flush  3 mL Intravenous Q12H  . thiamine  100 mg Oral Daily   Continuous Infusions:    LOS: 14 days    Time spent: 35 minutes    THOMPSON,DANIEL, MD [facility] Pager [phone]  If 7PM-7AM, please contact night-coverage [email or web address] Password [email or web address] 11/30/2015, 12:21 PM

## 2015-11-30 NOTE — Progress Notes (Signed)
Peripherally Inserted Central Catheter/Midline Placement  The IV Nurse has discussed with the patient and/or persons authorized to consent for the patient, the purpose of this procedure and the potential benefits and risks involved with this procedure.  The benefits include less needle sticks, lab draws from the catheter and patient may be discharged home with the catheter.  Risks include, but not limited to, infection, bleeding, blood clot (thrombus formation), and puncture of an artery; nerve damage and irregular heat beat.  Alternatives to this procedure were also discussed.  PICC/Midline Placement Documentation        Catherine Hernandez, Catherine Hernandez 11/30/2015, 6:08 PM

## 2015-12-01 LAB — BASIC METABOLIC PANEL
ANION GAP: 9 (ref 5–15)
BUN: 11 mg/dL (ref 6–20)
CALCIUM: 8.3 mg/dL — AB (ref 8.9–10.3)
CO2: 25 mmol/L (ref 22–32)
Chloride: 102 mmol/L (ref 101–111)
Creatinine, Ser: 0.61 mg/dL (ref 0.44–1.00)
GFR calc non Af Amer: >60 mL/min (ref >60)
Glucose, Bld: 93 mg/dL (ref 65–99)
Potassium: 3.7 mmol/L (ref 3.5–5.1)
Sodium: 136 mmol/L (ref 135–145)

## 2015-12-01 LAB — CULTURE, BLOOD (ROUTINE X 2)
CULTURE: NO GROWTH
Culture: NO GROWTH

## 2015-12-01 MED ORDER — METOPROLOL TARTRATE 5 MG/5ML IV SOLN: 5.0000 mg | INTRAVENOUS | Status: DC | PRN

## 2015-12-01 MED ORDER — TRAMADOL HCL 50 MG PO TABS
50.0000 mg | ORAL_TABLET | Freq: Once | ORAL | Status: AC
  Administered 2015-12-01: 50 mg via ORAL
  Filled 2015-12-01: qty 1

## 2015-12-01 NOTE — Progress Notes (Signed)
PROGRESS NOTE    Catherine Hernandez  NWG:[id number] DOB: 1986-01-04 DOA: 11/16/2015 PCP: No PCP Per Patient    Subjective: Reported that she did not sleep well but overall she is doing okay no other complaints. No fever or chills. PICC line placed yesterday.  Brief Narrative:  30 year old female with a history of polysubstance abuse (cocaine, heroin, tobacco, marijuana), asthma presented with increasing shortness of breath and coughing. Her symptoms were associated with diffuse myalgias and poor oral intake. Apparently, the patient's last heroin and cocaine use was approximately [facility] priorto admission. Upon presentation. The patient was noted to be febrile with a fever up to 101.47F, hypertension, and tachycardia. Code sepsis was activated, and the patient was started on intravenous fluids and intravenous antibiotics.The patient was found to have MRSA and Enterococcus faecalis bacteremia. ID was consulted to help manage antibiotics. The patient had tricuspid valve vegetation on TTE. TEE done on 11/23/15--see below.  Assessment & Plan:   Principal Problem:   Endocarditis of native valve Active Problems:   Sepsis (HCC)   CAP (community acquired pneumonia)   Polysubstance dependence including opioid type drug with complication, continuous use (HCC)   Opioid use with withdrawal (HCC)   Hyponatremia   Acute renal failure (HCC)   Hypocalcemia   Nonspecific abnormal electrocardiogram (ECG) (EKG)   Thrombocytopenia (HCC)   Enterococcal bacteremia   Staphylococcus aureus bacteremia   Normocytic anemia   Protein-calorie malnutrition (HCC)   Poor dentition   AKI (acute kidney injury) (HCC)   Malnutrition of moderate degree   Polysubstance abuse   Dyspnea   Tachypnea   Sepsis -due to bacteremia with metastatic infection -patient has pulmonary infiltrates which representsmetastatic infection from bacteremia -Saline lockIV fluids--sepsis physiology resolved -Continue Unasyn and IV  Rocephin -lactic acid 2.16-->0.99 -procalcitonin 18.52 -11/22/15--new fever-->repeat blood culture, UA--neg to date 11/22/15-repeat CXR--interstitial prominence-->lasix IV x 1 -11/23/15 TEE--2 large shaggy and very mobile vegetations on both leaflets of the TV -wean stress steroids to prednisone and taper. -Continue IV antibiotics, did not ask recommended till 9/12 (4 weeks of Abx)  Tachypnea -11/23/15 CXR--interstitial prominence-->lasix IV x 1 -in part due to metabolic acidosis--started bicarbonate po. Discontinued oral bicarbonate tablets. -98-100% on RA  MSSA & E faecalisBacteremia -abx per ID -requested TEE--scheduled for 11/22/15 -8/15, 8/16, 8/17--blood culture --positive -11/19/15 blood culture--neg @ 96hours -11/22/15 with 1/2 blood cultures positive for coagulase negative staph likely a contaminant. -Repeat blood cultures ordered per ID 11/26/2015 pending with no growth to date. -Place PICC line.  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%, tricuspid valve vegetations --11/23/15 TEE--2 large shaggy and very mobile vegetations on both leaflets of the TV. Patient has been assessed by cardiothoracic surgery, who feel no indication for tricuspid valvuloplasty at this time. Is felt patient's currently not a surgical candidate. Continue empiric IV antibiotics and will need follow-up 2-D echo towards the end of IV antibiotic course. ID following.  Pulmonary infiltrates/Septic emboli -V/Q scan--indeterminate -CT chest without contrast--multifocal nodular consolidations with cavitations likely septic emboli; RLL consolidation/wedge-shaped infarct -hemoptysis improved. Continue IV antibiotics.  Acute kidney injury -secondary to sepsis and hemodynamic changes -04/09/2013 serum creatinine 1.01 -11/16/2015 serum creatinine 4.43 -Renal ultrasound--bilateral echogenic kidneys; no hydronephrosis -UA with pyuria and proteinuria -check urine protein creatinine  ratio--1.24 -Resolved.  Hyponatremia -likely volume depletion -improved with IVF  Thrombocytopenia -due to sepsis -HIV antibody--neg -hep B and C--negative -improving with tx of infection  Hypokalemia/hypomagnesemia. -replete potassium. Keep magnesium greater than 2.  Hypocalcemia -improved 25 vitamin D--20.4-->supplement  Hemoptysis -INR  1.45 -PTT 40 -trend Hgb-->drop in Hgb likely dilution--no other obvious source of blood loss -improved  Anemia of chronic disease -drop likely dilution -11/22/15--transfused 2 units PRBC -h/h stable.   DVT prophylaxis: SCD Code Status: Full Family Communication: Updated patient. No family at bedside. Disposition Plan: Skilled nursing facility when bed available.   Consultants:   Infectious diseases: Dr. Orvan Falconer 11/18/2015  CT surgery: Dr. Donata Clay 11/23/2015  Cardiology for TEE.  Procedures:   TEE 11/23/2015  Chest x-ray 11/16/2015, 11/19/2015, 11/22/2015  2 d echo 11/18/2015  Renal ultrasound 11/17/2015   CT Chest 11/17/2015  VQ scan 11/17/2015   Transfuse 2 units packed red blood cells 11/22/2015  PICC placed on 8/29   Antimicrobials:   IV Unasyn 11/18/2015  IV Rocephin 11/22/2015  IV Zosyn 11/16/2015>>>> 11/17/2015  IV Vancomycin 11/16/2015>>>>>11/18/2015  Objective: Vitals:   11/30/15 0253 11/30/15 1350 11/30/15 2003 12/01/15 0640  BP: (!) 131/93 105/71 117/79 110/77  Pulse: (!) 107 (!) 115 97 98  Resp: 18 17  18   Temp: 100.2 F (37.9 C) 98 F (36.7 C) 98.5 F (36.9 C) 98.4 F (36.9 C)  TempSrc: Oral Oral Oral Oral  SpO2: 97% 99% 100% 97%  Weight: 44.8 kg (98 lb 12.3 oz)   43.4 kg (95 lb 10.9 oz)  Height:        Intake/Output Summary (Last 24 hours) at 12/01/15 1132 Last data filed at 12/01/15 0703  Gross per 24 hour  Intake             1330 ml  Output                0 ml  Net             1330 ml   Filed Weights   11/29/15 0500 11/30/15 0253 12/01/15 0640  Weight: 46.7 kg (103 lb)  44.8 kg (98 lb 12.3 oz) 43.4 kg (95 lb 10.9 oz)    Examination:  General exam: Appears calm and comfortable. Respiratory system: Clear to auscultation anterior lung fields. Respiratory effort normal. Cardiovascular system: S1 & S2 heard, RRR. No JVD, murmurs, rubs, gallops or clicks. No pedal edema. Gastrointestinal system: Abdomen is nondistended, soft and nontender. No organomegaly or masses felt. Normal bowel sounds heard. Central nervous system: Alert and oriented. No focal neurological deficits. Extremities: Symmetric 5 x 5 power. Skin: No rashes, lesions or ulcers Psychiatry: Judgement and insight appear normal. Mood & affect appropriate.     Data Reviewed: I have personally reviewed following labs and imaging studies  CBC:  Recent Labs Lab 11/25/15 0652 11/27/15 0555 11/28/15 1950 11/29/15 0300  WBC 13.1* 10.3 9.7 9.0  NEUTROABS  --   --  7.6  --   HGB 9.1* 8.1* 8.6* 8.3*  HCT 28.7* 26.1* 27.5* 26.9*  MCV 86.7 87.3 87.0 88.8  PLT 203 191 206 203   Basic Metabolic Panel:  Recent Labs Lab 11/25/15 0652 11/26/15 1155 11/27/15 0555 11/28/15 0252 11/29/15 0300 11/30/15 0306 12/01/15 0434  NA 136 138 139 138 138 136 136  K 3.3* 3.4* 3.1* 3.5 4.1 4.1 3.7  CL 106 106 105 109 107 104 102  CO2 23 26 27 23 23 24 25   GLUCOSE 84 116* 90 99 91 89 93  BUN 13 9 11 11 10 12 11   CREATININE 0.77 0.70 0.65 0.69 0.57 0.59 0.61  CALCIUM 7.3* 6.8* 7.3* 7.4* 7.7* 8.1* 8.3*  MG 1.6* 1.6*  --   --   --   --   --  GFR: Estimated Creatinine Clearance: 71.7 mL/min (by C-G formula based on SCr of 0.8 mg/dL). Liver Function Tests: No results for input(s): AST, ALT, ALKPHOS, BILITOT, PROT, ALBUMIN in the last 168 hours. No results for input(s): LIPASE, AMYLASE in the last 168 hours. No results for input(s): AMMONIA in the last 168 hours. Coagulation Profile: No results for input(s): INR, PROTIME in the last 168 hours. Cardiac Enzymes: No results for input(s): CKTOTAL, CKMB,  CKMBINDEX, TROPONINI in the last 168 hours. BNP (last 3 results) No results for input(s): PROBNP in the last 8760 hours. HbA1C: No results for input(s): HGBA1C in the last 72 hours. CBG: No results for input(s): GLUCAP in the last 168 hours. Lipid Profile: No results for input(s): CHOL, HDL, LDLCALC, TRIG, CHOLHDL, LDLDIRECT in the last 72 hours. Thyroid Function Tests: No results for input(s): TSH, T4TOTAL, FREET4, T3FREE, THYROIDAB in the last 72 hours. Anemia Panel: No results for input(s): VITAMINB12, FOLATE, FERRITIN, TIBC, IRON, RETICCTPCT in the last 72 hours. Sepsis Labs: No results for input(s): PROCALCITON, LATICACIDVEN in the last 168 hours.  Recent Results (from the past 240 hour(s))  Urine culture     Status: Abnormal   Collection Time: 11/22/15  9:41 PM  Result Value Ref Range Status   Specimen Description URINE, RANDOM  Final   Special Requests NONE  Final   Culture <10,000 COLONIES/mL INSIGNIFICANT GROWTH (A)  Final   Report Status 11/24/2015 FINAL  Final  Culture, blood (Routine X 2) w Reflex to ID Panel     Status: None   Collection Time: 11/22/15 10:20 PM  Result Value Ref Range Status   Specimen Description BLOOD RIGHT FOREARM  Final   Special Requests IN PEDIATRIC BOTTLE 1CC  Final   Culture NO GROWTH 5 DAYS  Final   Report Status 11/27/2015 FINAL  Final  Culture, blood (Routine X 2) w Reflex to ID Panel     Status: Abnormal   Collection Time: 11/22/15 10:39 PM  Result Value Ref Range Status   Specimen Description BLOOD LEFT HAND  Final   Special Requests IN PEDIATRIC BOTTLE 0.5CC  Final   Culture  Setup Time   Final    GRAM POSITIVE COCCI IN CLUSTERS AEROBIC BOTTLE ONLY CRITICAL VALUE NOTED.  VALUE IS CONSISTENT WITH PREVIOUSLY REPORTED AND CALLED VALUE.    Culture (A)  Final    STAPHYLOCOCCUS SPECIES (COAGULASE NEGATIVE) THE SIGNIFICANCE OF ISOLATING THIS ORGANISM FROM A SINGLE SET OF BLOOD CULTURES WHEN MULTIPLE SETS ARE DRAWN IS UNCERTAIN. PLEASE  NOTIFY THE MICROBIOLOGY DEPARTMENT WITHIN ONE WEEK IF SPECIATION AND SENSITIVITIES ARE REQUIRED.    Report Status 11/26/2015 FINAL  Final  Culture, blood (routine x 2)     Status: None (Preliminary result)   Collection Time: 11/26/15 12:00 PM  Result Value Ref Range Status   Specimen Description BLOOD LEFT ANTECUBITAL  Final   Special Requests IN PEDIATRIC BOTTLE  3CC  Final   Culture NO GROWTH 4 DAYS  Final   Report Status PENDING  Incomplete  Culture, blood (routine x 2)     Status: None (Preliminary result)   Collection Time: 11/26/15 12:10 PM  Result Value Ref Range Status   Specimen Description BLOOD LEFT ARM  Final   Special Requests IN PEDIATRIC BOTTLE  3CC  Final   Culture NO GROWTH 4 DAYS  Final   Report Status PENDING  Incomplete         Radiology Studies: No results found.      Scheduled Meds: .  ampicillin-sulbactam (UNASYN) IV  3 g Intravenous Q6H  . cefTRIAXone (ROCEPHIN)  IV  2 g Intravenous Q12H  . feeding supplement  1 Container Oral BID BM  . folic acid  1 mg Oral Daily  . magnesium oxide  400 mg Oral BID  . multivitamin with minerals  1 tablet Oral Daily  . nicotine  14 mg Transdermal Daily  . pantoprazole  40 mg Oral Q0600  . predniSONE  20 mg Oral Q breakfast  . sodium chloride flush  3 mL Intravenous Q12H  . thiamine  100 mg Oral Daily   Continuous Infusions:    LOS: 15 days    Time spent: 35 minutes    Joleene Burnham A, MD [facility] Pager [phone]  If 7PM-7AM, please contact night-coverage [email or web address] Password [email or web address] 12/01/2015, 11:32 AM

## 2015-12-01 NOTE — Care Management Note (Signed)
Case Management Note Donn PieriniKristi Damariz Paganelli RN, BSN Unit 2W-Case Manager [phone]  Patient Details  Name: Catherine Hernandez MRN: [id number] Date of Birth: [birth year]  Subjective/Objective:  Pt admitted with endocarditis, Hx IVDU                   Action/Plan: PTA pt lived at home- per MD will need 4-6 wks of IV abx- will need SNF due to IVDU hx- CSW following for bed search- plan for PICC once SNF bed found- repeat BC drawn 8/25  Expected Discharge Date:                  Expected Discharge Plan:  Skilled Nursing Facility  In-House Referral:  Clinical Social Work  Discharge planning Services  CM Consult  Post Acute Care Choice:    Choice offered to:     DME Arranged:    DME Agency:     HH Arranged:    HH Agency:     Status of Service:  In process, will continue to follow  If discussed at Long Length of Stay Meetings, dates discussed:  8/29, 8/31  Additional Comments:  12/01/15- 1430- Donn PieriniKristi Amirah Goerke RN, CM- pt has PICC placed- and is waiting on SNF bed- CSW working on placement for IV abx- till 12/14/15-   Zenda AlpersWebster, Lenn SinkKristi Hall, RN 12/01/2015, 2:33 PM

## 2015-12-01 NOTE — Progress Notes (Signed)
Patient had a 11 beat run of Vtach and multiple PVC's. Patient is asymptomatic. Physician has been notified.

## 2015-12-02 LAB — BASIC METABOLIC PANEL
Anion gap: 9 (ref 5–15)
BUN: 11 mg/dL (ref 6–20)
CO2: 22 mmol/L (ref 22–32)
Calcium: 8.8 mg/dL — ABNORMAL LOW (ref 8.9–10.3)
Chloride: 102 mmol/L (ref 101–111)
Creatinine, Ser: 0.65 mg/dL (ref 0.44–1.00)
GFR calc Af Amer: >60 mL/min (ref >60)
GFR calc non Af Amer: >60 mL/min (ref >60)
Glucose, Bld: 121 mg/dL — ABNORMAL HIGH (ref 65–99)
Potassium: 4.6 mmol/L (ref 3.5–5.1)
SODIUM: 133 mmol/L — AB (ref 135–145)

## 2015-12-02 LAB — MAGNESIUM: MAGNESIUM: 2 mg/dL (ref 1.7–2.4)

## 2015-12-02 MED ORDER — PANTOPRAZOLE SODIUM 40 MG PO TBEC: 40.0000 mg | DELAYED_RELEASE_TABLET | Freq: Every day | ORAL | Status: DC

## 2015-12-02 MED ORDER — HEPARIN SOD (PORK) LOCK FLUSH 100 UNIT/ML IV SOLN
250.0000 [IU] | INTRAVENOUS | Status: AC | PRN
  Administered 2015-12-02: 250 [IU]

## 2015-12-02 MED ORDER — IBUPROFEN 800 MG PO TABS: 800.0000 mg | ORAL_TABLET | Freq: Three times a day (TID) | ORAL | Status: DC | PRN

## 2015-12-02 MED ORDER — SODIUM CHLORIDE 0.9 % IV SOLN: 3.0000 g | Freq: Four times a day (QID) | INTRAVENOUS | Status: DC

## 2015-12-02 MED ORDER — BOOST / RESOURCE BREEZE PO LIQD: 1.0000 | Freq: Two times a day (BID) | ORAL | 0 refills | Status: DC

## 2015-12-02 MED ORDER — DEXTROSE 5 % IV SOLN: 2.0000 g | Freq: Two times a day (BID) | INTRAVENOUS | Status: DC

## 2015-12-02 NOTE — Care Management Note (Signed)
Case Management Note Donn PieriniKristi Areal Cochrane RN, BSN Unit 2W-Case Manager [phone]  Patient Details  Name: Margretta Dittyngela N Vanderberg MRN: [id number] Date of Birth: [birth year]  Subjective/Objective:  Pt admitted with endocarditis, Hx IVDU                   Action/Plan: PTA pt lived at home- per MD will need 4-6 wks of IV abx- will need SNF due to IVDU hx- CSW following for bed search- plan for PICC once SNF bed found- repeat BC drawn 8/25  Expected Discharge Date:   12/02/15               Expected Discharge Plan:  Skilled Nursing Facility  In-House Referral:  Clinical Social Work  Discharge planning Services  CM Consult  Post Acute Care Choice:    Choice offered to:     DME Arranged:    DME Agency:     HH Arranged:    HH Agency:     Status of Service:  Completed, signed off  If discussed at Microsoft[employer] Length of Stay Meetings, dates discussed:  8/29, 8/31  Additional Comments:  12/02/15- 1130- Donn PieriniKristi Mckinnon Glick RN, CM- per CSW [facility] [place]- pt to d/c to SNF today- CSW following for placement needs.   12/01/15- 1430- Kimiye Strathman RN, CM- pt has PICC placed- and is waiting on SNF bed- CSW working on placement for IV abx- till 12/14/15-   Zenda AlpersWebster, Lenn SinkKristi Hall, RN 12/02/2015, 11:31 AM

## 2015-12-02 NOTE — Discharge Summary (Signed)
Physician Discharge Summary  Catherine Hernandez ZOX:[id number] DOB: [birth year] DOA: 11/16/2015  PCP: No PCP Per Patient  Admit date: 11/16/2015 Discharge date: 12/02/2015  Admitted From: Home Disposition: SNF  Recommendations for Outpatient Follow-up:  1. Follow up with PCP in 1-2 weeks 2. Please obtain BMP/CBC in one week 3. Continue Unasyn and Rocephin until 12/14/2015.  Home Health:NA Equipment/Devices:NA  Discharge Condition: Stabel CODE STATUS: Full Diet recommendation: Regular  Brief/Interim Summary: 30 year old female with a history of polysubstance abuse (cocaine, heroin, tobacco, marijuana), asthma presented with increasing shortness of breath and coughing. Her symptoms were associated with diffuse myalgias and poor oral intake. Apparently, the patient's last heroin and cocaine use was approximately [facility]weeks priorto admission. Upon presentation. The patient was noted to be febrile with a fever up to 101.72F, hypertension, and tachycardia. Code sepsis was activated, and the patient was started on intravenous fluids and intravenous antibiotics.The patient was found to have MRSA and Enterococcus faecalis bacteremia. ID was consulted to help manage antibiotics. The patient had tricuspid valve vegetation on TTE. TEE done on 11/23/15--see below.  Discharge Diagnoses:  Principal Problem:   Endocarditis of native valve Active Problems:   Sepsis (HCC)   CAP (community acquired pneumonia)   Polysubstance dependence including opioid type drug with complication, continuous use (HCC)   Opioid use with withdrawal (HCC)   Hyponatremia   Acute renal failure (HCC)   Hypocalcemia   Nonspecific abnormal electrocardiogram (ECG) (EKG)   Thrombocytopenia (HCC)   Enterococcal bacteremia   Staphylococcus aureus bacteremia   Normocytic anemia   Protein-calorie malnutrition (HCC)   Poor dentition   AKI (acute kidney injury) (HCC)   Malnutrition of moderate degree   Polysubstance abuse    Dyspnea   Tachypnea   Sepsis -Due to bacteremia with metastatic infection -Patient has pulmonary infiltrates which representsmetastatic infection from bacteremia -Saline lockIV fluids--sepsis physiology resolved -Continue Unasyn and IV Rocephin -Lactic acid 2.16-->0.99 -Procalcitonin 18.52 -11/22/15--new fever-->repeat blood culture, UA--neg to date 11/22/15-repeat CXR--interstitial prominence-->lasix IV x 1 -11/23/15 TEE--2 large shaggy and very mobile vegetations on both leaflets of the TV -Steroids discontinued prior to discharge. -Discharged on Rocephin and Unasyn and date is 12/14/2015.  Tachypnea -11/23/15 CXR--interstitial prominence-->lasix IV x 1 -in part due to metabolic acidosis--startedbicarbonate po. Discontinued oral bicarbonate tablets. -98-100% on RA  MSSA & E faecalisBacteremia -abx per ID -requested TEE--scheduled for 11/22/15 -8/15, 8/16, 8/17--blood culture --positive -11/19/15 blood culture--neg @ 96hours -11/22/15 with 1/2 blood cultures positive for coagulase negative staph likely a contaminant. -Repeat blood cultures ordered per ID 11/26/2015 pending with no growth to date.  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%, tricuspid valve vegetations --11/23/15 TEE--2 large shaggy and very mobile vegetations on both leaflets of the TV. Patient has been assessed by cardiothoracic surgery, who feel no indication for tricuspid valvuloplasty at this time. Is felt patient's currently not a surgical candidate.  -Discussed with Dr. Orvan Falconerampbell on the day of discharge, recommended Rocephin and Unasyn till 12/17/2015.  Pulmonary infiltrates/Septic emboli -V/Q scan--indeterminate -CT chest without contrast--multifocal nodular consolidations with cavitations likely septic emboli; RLL consolidation/wedge-shaped infarct -hemoptysis improved. Continue IV antibiotics.  Acute kidney injury -secondary to sepsis and hemodynamic changes -04/09/2013 serum  creatinine 1.01 -11/16/2015 serum creatinine 4.43 -Renal ultrasound--bilateral echogenic kidneys; no hydronephrosis -UA with pyuria and proteinuria -check urine protein creatinine ratio--1.24 -Resolved.  Hyponatremia -likely volume depletion -improved with IVF  Thrombocytopenia -due to sepsis -HIV antibody--neg -hep B and C--negative -improving with tx of infection  Hypokalemia/hypomagnesemia. -replete potassium. Keep magnesium greater than  2.  Hypocalcemia -improved 25 vitamin D--20.4-->supplement  Hemoptysis -INR 1.45 -PTT 40 -trend Hgb-->drop in Hgb likely dilution--no other obvious source of blood loss -This is likely secondary to septic emboli from the tricuspid valve, this is improved.  Anemia of chronic disease -drop likely dilution -11/22/15--transfused2 units PRBC -h/h stable.    Discharge Instructions  Discharge Instructions    Diet - low sodium heart healthy    Complete by:  As directed   Increase activity slowly    Complete by:  As directed       Medication List    TAKE these medications   Ampicillin-Sulbactam 3 g in sodium chloride 0.9 % 100 mL Inject 3 g into the vein every 6 (six) hours.   cefTRIAXone 2 g in dextrose 5 % 50 mL Inject 2 g into the vein every 12 (twelve) hours.   feeding supplement Liqd Take 1 Container by mouth 2 (two) times daily between meals.   ibuprofen 800 MG tablet Commonly known as:  ADVIL,MOTRIN Take 1 tablet (800 mg total) by mouth every 8 (eight) hours as needed for fever, headache or mild pain.   pantoprazole 40 MG tablet Commonly known as:  PROTONIX Take 1 tablet (40 mg total) by mouth daily at 6 (six) AM.       No Known Allergies  Consultations:  ID  Cardio  CTS   Procedures/Studies: Dg Chest 2 View  Result Date: 11/16/2015 CLINICAL DATA:  Intermittent moderate cough beginning 2 days ago. Shortness breath. Moderate back and rib pain. Next healed EXAM: CHEST  2 VIEW COMPARISON:   Two-view chest x-ray 04/10/2013. FINDINGS: Multi focal right lower lobe airspace disease is present. Additional right upper lobe airspace disease present. There is prominence of the hila bilaterally. Linear densities are noted at the left base. The heart size is normal. The visualized soft tissues and bony thorax are unremarkable. IMPRESSION: 1. Multi focal pneumonia involving the right lower lobe, right upper lobe, and possibly left lower lobe. 2. Moderate hilar prominence likely representing vascular congestion neck and possible reactive adenopathy. 3. Recommend follow-up two-view chest x-ray following appropriate treatment therapy. Electronically Signed   By: Marin Roberts M.D.   On: 11/16/2015 21:32   Ct Chest Wo Contrast  Result Date: 11/18/2015 CLINICAL DATA:  30 year old female with history of pulmonary infiltrates. Pneumonia. History of substance abuse. EXAM: CT CHEST WITHOUT CONTRAST TECHNIQUE: Multidetector CT imaging of the chest was performed following the standard protocol without IV contrast. COMPARISON:  No prior chest CT.  Chest x-ray 11/16/2015. FINDINGS: Cardiovascular: Heart size is normal. There is no significant pericardial fluid, thickening or pericardial calcification. Mediastinum/Nodes: Multiple prominent mediastinal lymph nodes measuring up to 1.5 cm in short axis in the subcarinal nodal station. Please note that accurate exclusion of hilar adenopathy is limited on noncontrast CT scans. Esophagus is unremarkable in appearance. No axillary lymphadenopathy. Lungs/Pleura: Multifocal airspace consolidation is noted throughout the lungs bilaterally, most confluent in the posterior aspect of the right lower lobe. Several of the areas of apparent airspace consolidation are somewhat nodular in appearance, and there is one cavitary area in the right upper lobe (image 80 of series 3) which measures up to 1.7 cm in diameter, in addition to a smaller cavitary area in the periphery of the left  lower lobe (image 89 of series 3). The overall spectrum of findings is highly concerning for multifocal pneumonia, presumably from septic emboli. Given the somewhat wedge-shaped appearance of the largest area of consolidation in the right lower  lobe, the possibility of significant ischemia/infarct in the right lower lobe related to a larger pulmonary embolus is considered likely (image 71 of series 3). Trace bilateral pleural effusions lie dependently. Upper Abdomen: Although incompletely visualized, the spleen appears enlarged measuring up to 13.8 x 5.1 cm on axial images. Musculoskeletal: There are no aggressive appearing lytic or blastic lesions noted in the visualized portions of the skeleton. IMPRESSION: 1. The overall spectrum of findings in the lungs is compatible with multilobar pneumonia, likely from septic embolization. 2. The largest area of airspace consolidation in the right lower lobe has a very wedge-shaped in appearanceand is concerning for potential pulmonary infarct from larger embolus. This could be further evaluated with PE protocol CT scan if clinically appropriate. 3. Trace bilateral pleural effusions lying dependently. 4. Splenomegaly. These results will be called to the ordering clinician or representative by the Radiologist Assistant, and communication documented in the PACS or zVision Dashboard. Electronically Signed   By: Trudie Reed M.D.   On: 11/18/2015 08:28   US Renal  Result Date: 11/17/2015 CLINICAL DATA:  Acute kidney injury.  Sepsis. EXAM: RENAL / URINARY TRACT ULTRASOUND COMPLETE COMPARISON:  None. FINDINGS: Right Kidney: Length: 13.8 cm, within normal limits. The kidney is somewhat echogenic. It is isoechoic to the index organ, the liver. No discrete mass lesion or stone is present. There is no hydronephrosis. Left Kidney: Length: 13.9 cm, within normal limits. The kidney is somewhat echogenic. It is isoechoic to the index organ, the spleen. No discrete mass lesion or  stone is present. There is no hydronephrosis. Bladder: Appears normal for degree of bladder distention. Incidental note is made of multiple shadowing stones within the gallbladder. There is no significant wall thickening to suggest cholecystitis. IMPRESSION: 1. The kidneys are hyperechoic bilaterally. This is nonspecific, but can be seen in the setting of acute medical renal disease. 2. No renal calculus or mass lesion.  No hydronephrosis. 3. Cholelithiasis without evidence for cholecystitis. Electronically Signed   By: Marin Roberts M.D.   On: 11/17/2015 22:27   Nm Pulmonary Perf And Vent  Result Date: 11/17/2015 CLINICAL DATA:  Shortness of breath, hemoptysis, elevated D-dimer, history asthma EXAM: NUCLEAR MEDICINE BOWEL (MECKEL'S) SCAN TECHNIQUE: Sequential abdominal images were obtained following intravenous injection of radiopharmaceutical. RADIOPHARMACEUTICALS:  mCi Tc-94m pertechnetate IV 30.3 mCi Technetium-56m DTPA aerosol inhalation and 4.0 mCi Technetium-89m MAA IV COMPARISON:  None. None Correlation:  Chest radiograph 11/16/2015 FINDINGS: Ventilation: Diminished ventilation at apices. Focal ventilation defect at medial LEFT lower lobe. Additional diminished ventilation in both lower lobes. Perfusion: Small subsegmental perfusion defect lateral RIGHT upper lobe corresponding to focal opacity on chest radiograph. Additional large segmental defect medial RIGHT middle lobe corresponding to infiltrate on the chest radiograph. Additional perfusion defects in BILATERAL lower lobes, though stripe signs are present at both. Chest radiograph demonstrates multifocal multifocal infiltrates bilaterally. Presence of matching ventilation, perfusion, and radiographic abnormalities represents an intermediate probability for pulmonary embolism. IMPRESSION: Matching ventilatory, perfusion and radiographic abnormalities compatible with an intermediate probability for pulmonary embolism. Electronically Signed    By: Ulyses Southward M.D.   On: 11/17/2015 12:03   Dg Chest Port 1 View  Result Date: 11/22/2015 CLINICAL DATA:  Shortness of Breath EXAM: PORTABLE CHEST 1 VIEW November 19, 2015 and chest CT November 17, 2015 FINDINGS: There has been slight partial clearing of consolidation from the right lower lobe. Patchy infiltrate in the left perihilar region remains. A somewhat larger focus of consolidation in the medial left base remains. The  cavitary area in the left apex remains stable as does a somewhat ill-defined nodular opacity in the right upper lobe. No new areas of opacity are evident. Heart size and pulmonary vascular normal. No adenopathy. No bone lesions evident. IMPRESSION: Slight clearing of infiltrate from the right lower lobe. Other areas of opacity/consolidation remain stable. There remains cavitation in a lesion in the left upper lobe. No new opacities are evident. Heart size within normal limits. Electronically Signed   By: Bretta Bang III M.D.   On: 11/22/2015 17:25   Dg Chest Port 1 View  Result Date: 11/19/2015 CLINICAL DATA:  Shortness of breath and chest pain EXAM: PORTABLE CHEST 1 VIEW COMPARISON:  11/17/2015 FINDINGS: Cardiac shadow is stable. Persistent right lower lobe infiltrate is noted. Patchy changes are noted bilaterally consistent with that seen on recent CT examination. No acute bony abnormality is noted. IMPRESSION: Patchy changes bilaterally, worst in the right lower lobe consistent with multifocal pneumonia. Electronically Signed   By: Alcide Clever M.D.   On: 11/19/2015 11:21    (Echo, Carotid, EGD, Colonoscopy, ERCP)    Subjective:   Discharge Exam: Vitals:   12/01/15 2010 12/02/15 0310  BP: 110/69 117/76  Pulse: 98 (!) 115  Resp:    Temp: 98.1 F (36.7 C) 99.7 F (37.6 C)   Vitals:   12/01/15 1318 12/01/15 1327 12/01/15 2010 12/02/15 0310  BP: 92/60 134/88 110/69 117/76  Pulse: 99 81 98 (!) 115  Resp: 18 18    Temp: 98 F (36.7 C) 98.5 F (36.9 C) 98.1 F  (36.7 C) 99.7 F (37.6 C)  TempSrc: Oral Oral Oral Oral  SpO2: 96% 98% 99% 95%  Weight:    43.1 kg (95 lb 0.3 oz)  Height:        General: Pt is alert, awake, not in acute distress Cardiovascular: RRR, S1/S2 +, no rubs, no gallops Respiratory: CTA bilaterally, no wheezing, no rhonchi Abdominal: Soft, NT, ND, bowel sounds + Extremities: no edema, no cyanosis    The results of significant diagnostics from this hospitalization (including imaging, microbiology, ancillary and laboratory) are listed below for reference.     Microbiology: Recent Results (from the past 240 hour(s))  Urine culture     Status: Abnormal   Collection Time: 11/22/15  9:41 PM  Result Value Ref Range Status   Specimen Description URINE, RANDOM  Final   Special Requests NONE  Final   Culture <10,000 COLONIES/mL INSIGNIFICANT GROWTH (A)  Final   Report Status 11/24/2015 FINAL  Final  Culture, blood (Routine X 2) w Reflex to ID Panel     Status: None   Collection Time: 11/22/15 10:20 PM  Result Value Ref Range Status   Specimen Description BLOOD RIGHT FOREARM  Final   Special Requests IN PEDIATRIC BOTTLE 1CC  Final   Culture NO GROWTH 5 DAYS  Final   Report Status 11/27/2015 FINAL  Final  Culture, blood (Routine X 2) w Reflex to ID Panel     Status: Abnormal   Collection Time: 11/22/15 10:39 PM  Result Value Ref Range Status   Specimen Description BLOOD LEFT HAND  Final   Special Requests IN PEDIATRIC BOTTLE 0.5CC  Final   Culture  Setup Time   Final    GRAM POSITIVE COCCI IN CLUSTERS AEROBIC BOTTLE ONLY CRITICAL VALUE NOTED.  VALUE IS CONSISTENT WITH PREVIOUSLY REPORTED AND CALLED VALUE.    Culture (A)  Final    STAPHYLOCOCCUS SPECIES (COAGULASE NEGATIVE) THE SIGNIFICANCE OF ISOLATING THIS  ORGANISM FROM A SINGLE SET OF BLOOD CULTURES WHEN MULTIPLE SETS ARE DRAWN IS UNCERTAIN. PLEASE NOTIFY THE MICROBIOLOGY DEPARTMENT WITHIN ONE WEEK IF SPECIATION AND SENSITIVITIES ARE REQUIRED.    Report Status  11/26/2015 FINAL  Final  Culture, blood (routine x 2)     Status: None   Collection Time: 11/26/15 12:00 PM  Result Value Ref Range Status   Specimen Description BLOOD LEFT ANTECUBITAL  Final   Special Requests IN PEDIATRIC BOTTLE  3CC  Final   Culture NO GROWTH 5 DAYS  Final   Report Status 12/01/2015 FINAL  Final  Culture, blood (routine x 2)     Status: None   Collection Time: 11/26/15 12:10 PM  Result Value Ref Range Status   Specimen Description BLOOD LEFT ARM  Final   Special Requests IN PEDIATRIC BOTTLE  3CC  Final   Culture NO GROWTH 5 DAYS  Final   Report Status 12/01/2015 FINAL  Final     Labs: BNP (last 3 results)  Recent Labs  11/16/15 2030  BNP 215.3*   Basic Metabolic Panel:  Recent Labs Lab 11/26/15 1155  11/28/15 0252 11/29/15 0300 11/30/15 0306 12/01/15 0434 12/02/15 1015  NA 138  < > 138 138 136 136 133*  K 3.4*  < > 3.5 4.1 4.1 3.7 4.6  CL 106  < > 109 107 104 102 102  CO2 26  < > 23 23 24 25 22   GLUCOSE 116*  < > 99 91 89 93 121*  BUN 9  < > 11 10 12 11 11   CREATININE 0.70  < > 0.69 0.57 0.59 0.61 0.65  CALCIUM 6.8*  < > 7.4* 7.7* 8.1* 8.3* 8.8*  MG 1.6*  --   --   --   --   --  2.0  < > = values in this interval not displayed. Liver Function Tests: No results for input(s): AST, ALT, ALKPHOS, BILITOT, PROT, ALBUMIN in the last 168 hours. No results for input(s): LIPASE, AMYLASE in the last 168 hours. No results for input(s): AMMONIA in the last 168 hours. CBC:  Recent Labs Lab 11/27/15 0555 11/28/15 1950 11/29/15 0300  WBC 10.3 9.7 9.0  NEUTROABS  --  7.6  --   HGB 8.1* 8.6* 8.3*  HCT 26.1* 27.5* 26.9*  MCV 87.3 87.0 88.8  PLT 191 206 203   Cardiac Enzymes: No results for input(s): CKTOTAL, CKMB, CKMBINDEX, TROPONINI in the last 168 hours. BNP: Invalid input(s): POCBNP CBG: No results for input(s): GLUCAP in the last 168 hours. D-Dimer No results for input(s): DDIMER in the last 72 hours. Hgb A1c No results for input(s):  HGBA1C in the last 72 hours. Lipid Profile No results for input(s): CHOL, HDL, LDLCALC, TRIG, CHOLHDL, LDLDIRECT in the last 72 hours. Thyroid function studies No results for input(s): TSH, T4TOTAL, T3FREE, THYROIDAB in the last 72 hours.  Invalid input(s): FREET3 Anemia work up No results for input(s): VITAMINB12, FOLATE, FERRITIN, TIBC, IRON, RETICCTPCT in the last 72 hours. Urinalysis    Component Value Date/Time   COLORURINE YELLOW 11/22/2015 2141   APPEARANCEUR HAZY (A) 11/22/2015 2141   LABSPEC 1.019 11/22/2015 2141   PHURINE 6.0 11/22/2015 2141   GLUCOSEU 250 (A) 11/22/2015 2141   HGBUR MODERATE (A) 11/22/2015 2141   BILIRUBINUR NEGATIVE 11/22/2015 2141   KETONESUR NEGATIVE 11/22/2015 2141   PROTEINUR 100 (A) 11/22/2015 2141   UROBILINOGEN 0.2 01/07/2012 1140   NITRITE NEGATIVE 11/22/2015 2141   LEUKOCYTESUR NEGATIVE 11/22/2015 2141  Sepsis Labs Invalid input(s): PROCALCITONIN,  WBC,  LACTICIDVEN Microbiology Recent Results (from the past 240 hour(s))  Urine culture     Status: Abnormal   Collection Time: 11/22/15  9:41 PM  Result Value Ref Range Status   Specimen Description URINE, RANDOM  Final   Special Requests NONE  Final   Culture <10,000 COLONIES/mL INSIGNIFICANT GROWTH (A)  Final   Report Status 11/24/2015 FINAL  Final  Culture, blood (Routine X 2) w Reflex to ID Panel     Status: None   Collection Time: 11/22/15 10:20 PM  Result Value Ref Range Status   Specimen Description BLOOD RIGHT FOREARM  Final   Special Requests IN PEDIATRIC BOTTLE 1CC  Final   Culture NO GROWTH 5 DAYS  Final   Report Status 11/27/2015 FINAL  Final  Culture, blood (Routine X 2) w Reflex to ID Panel     Status: Abnormal   Collection Time: 11/22/15 10:39 PM  Result Value Ref Range Status   Specimen Description BLOOD LEFT HAND  Final   Special Requests IN PEDIATRIC BOTTLE 0.5CC  Final   Culture  Setup Time   Final    GRAM POSITIVE COCCI IN CLUSTERS AEROBIC BOTTLE ONLY CRITICAL  VALUE NOTED.  VALUE IS CONSISTENT WITH PREVIOUSLY REPORTED AND CALLED VALUE.    Culture (A)  Final    STAPHYLOCOCCUS SPECIES (COAGULASE NEGATIVE) THE SIGNIFICANCE OF ISOLATING THIS ORGANISM FROM A SINGLE SET OF BLOOD CULTURES WHEN MULTIPLE SETS ARE DRAWN IS UNCERTAIN. PLEASE NOTIFY THE MICROBIOLOGY DEPARTMENT WITHIN ONE WEEK IF SPECIATION AND SENSITIVITIES ARE REQUIRED.    Report Status 11/26/2015 FINAL  Final  Culture, blood (routine x 2)     Status: None   Collection Time: 11/26/15 12:00 PM  Result Value Ref Range Status   Specimen Description BLOOD LEFT ANTECUBITAL  Final   Special Requests IN PEDIATRIC BOTTLE  3CC  Final   Culture NO GROWTH 5 DAYS  Final   Report Status 12/01/2015 FINAL  Final  Culture, blood (routine x 2)     Status: None   Collection Time: 11/26/15 12:10 PM  Result Value Ref Range Status   Specimen Description BLOOD LEFT ARM  Final   Special Requests IN PEDIATRIC BOTTLE  3CC  Final   Culture NO GROWTH 5 DAYS  Final   Report Status 12/01/2015 FINAL  Final     Time coordinating discharge: Over 30 minutes  SIGNED:   Clint Lipps, MD  [facility] 12/02/2015, 10:52 AM Pager   If 7PM-7AM, please contact night-coverage [email or web address] Password [email or web address]

## 2015-12-02 NOTE — Clinical Social Work Placement (Addendum)
   CLINICAL SOCIAL WORK PLACEMENT  NOTE  Date:  12/02/2015  Patient Details  Name: Margretta Dittyngela N Antonini MRN: [id number] Date of Birth: 02-May-1985  Clinical Social Work is seeking post-discharge placement for this patient at the Skilled  Nursing Facility level of care (*CSW will initial, date and re-position this form in  chart as items are completed):  Yes   Patient/family provided with [facility] Social Work Department's list of facilities offering this level of care within the geographic area requested by the patient (or if unable, by the patient's family).  Yes   Patient/family informed of their freedom to choose among providers that offer the needed level of care, that participate in Medicare, Medicaid or managed care program needed by the patient, have an available bed and are willing to accept the patient.  Yes   Patient/family informed of [facility]'s ownership interest in [facility] and [facility], as well as of the fact that they are under no obligation to receive care at these facilities.  PASRR submitted to EDS on 11/25/15     PASRR number received on 11/25/15     Existing PASRR number confirmed on       FL2 transmitted to all facilities in geographic area requested by pt/family on       FL2 transmitted to all facilities within larger geographic area on       Patient informed that his/her managed care company has contracts with or will negotiate with certain facilities, including the following:            Patient/family informed of bed offers received.  Patient chooses bed at [facility]     Physician recommends and patient chooses bed at      Patient to be transferred to [facility] on 12/02/15.  [facility]     Patient family notified on   of transfer.  Name of family member notified:        PHYSICIAN Please sign FL2, Please prepare priority discharge summary,  including medications, Please prepare prescriptions     Additional Comment:  Per MD patient is ready to discharge to [facility]. RN, patient, and facility notified of discharge. Patient reported she will call her supports to notify them of her discharge. RN given phone number for report. CSW [facility]. [facility]  requested for 2:00PM. CSW signing off.   _______________________________________________ Reggy EyeLaShonda A Seara Hinesley, LCSW 12/02/2015, 1:32 PM

## 2015-12-02 NOTE — Progress Notes (Signed)
Report has been given to [facility] [place].

## 2015-12-16 ENCOUNTER — Inpatient Hospital Stay (HOSPITAL_COMMUNITY)
Admission: EM | Admit: 2015-12-16 | Discharge: 2015-12-19 | DRG: 871 | Discharge status: Against Medical Advice | Payer: Medicaid Other | Attending: Internal Medicine | Admitting: Internal Medicine

## 2015-12-16 ENCOUNTER — Emergency Department (HOSPITAL_COMMUNITY): Payer: Self-pay

## 2015-12-16 ENCOUNTER — Encounter (HOSPITAL_COMMUNITY): Payer: Self-pay | Admitting: Emergency Medicine

## 2015-12-16 DIAGNOSIS — A4181 Sepsis due to Enterococcus: Principal | ICD-10-CM | POA: Diagnosis present

## 2015-12-16 DIAGNOSIS — Z9114 Patient's other noncompliance with medication regimen: Secondary | ICD-10-CM

## 2015-12-16 DIAGNOSIS — W109XXA Fall (on) (from) unspecified stairs and steps, initial encounter: Secondary | ICD-10-CM | POA: Diagnosis present

## 2015-12-16 DIAGNOSIS — R4182 Altered mental status, unspecified: Secondary | ICD-10-CM

## 2015-12-16 DIAGNOSIS — Z681 Body mass index (BMI) 19 or less, adult: Secondary | ICD-10-CM

## 2015-12-16 DIAGNOSIS — T3696XA Underdosing of unspecified systemic antibiotic, initial encounter: Secondary | ICD-10-CM | POA: Diagnosis present

## 2015-12-16 DIAGNOSIS — R7881 Bacteremia: Secondary | ICD-10-CM | POA: Diagnosis present

## 2015-12-16 DIAGNOSIS — K219 Gastro-esophageal reflux disease without esophagitis: Secondary | ICD-10-CM | POA: Diagnosis present

## 2015-12-16 DIAGNOSIS — I368 Other nonrheumatic tricuspid valve disorders: Secondary | ICD-10-CM

## 2015-12-16 DIAGNOSIS — J45909 Unspecified asthma, uncomplicated: Secondary | ICD-10-CM | POA: Diagnosis present

## 2015-12-16 DIAGNOSIS — I079 Rheumatic tricuspid valve disease, unspecified: Secondary | ICD-10-CM | POA: Diagnosis present

## 2015-12-16 DIAGNOSIS — F1920 Other psychoactive substance dependence, uncomplicated: Secondary | ICD-10-CM

## 2015-12-16 DIAGNOSIS — E871 Hypo-osmolality and hyponatremia: Secondary | ICD-10-CM | POA: Diagnosis present

## 2015-12-16 DIAGNOSIS — S93402A Sprain of unspecified ligament of left ankle, initial encounter: Secondary | ICD-10-CM | POA: Diagnosis present

## 2015-12-16 DIAGNOSIS — I2690 Septic pulmonary embolism without acute cor pulmonale: Secondary | ICD-10-CM | POA: Diagnosis present

## 2015-12-16 DIAGNOSIS — F17210 Nicotine dependence, cigarettes, uncomplicated: Secondary | ICD-10-CM | POA: Diagnosis present

## 2015-12-16 DIAGNOSIS — N12 Tubulo-interstitial nephritis, not specified as acute or chronic: Secondary | ICD-10-CM | POA: Diagnosis present

## 2015-12-16 DIAGNOSIS — F1420 Cocaine dependence, uncomplicated: Secondary | ICD-10-CM | POA: Diagnosis present

## 2015-12-16 DIAGNOSIS — I76 Septic arterial embolism: Secondary | ICD-10-CM | POA: Diagnosis present

## 2015-12-16 DIAGNOSIS — B952 Enterococcus as the cause of diseases classified elsewhere: Secondary | ICD-10-CM | POA: Diagnosis present

## 2015-12-16 DIAGNOSIS — A419 Sepsis, unspecified organism: Secondary | ICD-10-CM | POA: Diagnosis present

## 2015-12-16 DIAGNOSIS — B9689 Other specified bacterial agents as the cause of diseases classified elsewhere: Secondary | ICD-10-CM

## 2015-12-16 DIAGNOSIS — E876 Hypokalemia: Secondary | ICD-10-CM | POA: Diagnosis present

## 2015-12-16 DIAGNOSIS — F1124 Opioid dependence with opioid-induced mood disorder: Secondary | ICD-10-CM | POA: Diagnosis present

## 2015-12-16 DIAGNOSIS — E440 Moderate protein-calorie malnutrition: Secondary | ICD-10-CM | POA: Diagnosis present

## 2015-12-16 DIAGNOSIS — F1120 Opioid dependence, uncomplicated: Secondary | ICD-10-CM | POA: Diagnosis present

## 2015-12-16 DIAGNOSIS — S93409A Sprain of unspecified ligament of unspecified ankle, initial encounter: Secondary | ICD-10-CM

## 2015-12-16 DIAGNOSIS — E869 Volume depletion, unspecified: Secondary | ICD-10-CM | POA: Diagnosis present

## 2015-12-16 DIAGNOSIS — F1220 Cannabis dependence, uncomplicated: Secondary | ICD-10-CM | POA: Diagnosis present

## 2015-12-16 DIAGNOSIS — I330 Acute and subacute infective endocarditis: Secondary | ICD-10-CM

## 2015-12-16 DIAGNOSIS — G92 Toxic encephalopathy: Secondary | ICD-10-CM | POA: Diagnosis present

## 2015-12-16 DIAGNOSIS — R64 Cachexia: Secondary | ICD-10-CM | POA: Diagnosis present

## 2015-12-16 DIAGNOSIS — F1910 Other psychoactive substance abuse, uncomplicated: Secondary | ICD-10-CM

## 2015-12-16 DIAGNOSIS — Z91128 Patient's intentional underdosing of medication regimen for other reason: Secondary | ICD-10-CM

## 2015-12-16 DIAGNOSIS — D649 Anemia, unspecified: Secondary | ICD-10-CM | POA: Diagnosis present

## 2015-12-16 DIAGNOSIS — A4101 Sepsis due to Methicillin susceptible Staphylococcus aureus: Secondary | ICD-10-CM | POA: Diagnosis present

## 2015-12-16 DIAGNOSIS — Z515 Encounter for palliative care: Secondary | ICD-10-CM | POA: Diagnosis not present

## 2015-12-16 HISTORY — DX: Hypo-osmolality and hyponatremia: E87.1

## 2015-12-16 HISTORY — DX: Anemia, unspecified: D64.9

## 2015-12-16 HISTORY — DX: Enterococcus as the cause of diseases classified elsewhere: B95.2

## 2015-12-16 HISTORY — DX: Bacteremia: R78.81

## 2015-12-16 LAB — COMPREHENSIVE METABOLIC PANEL
ALT: 13 U/L — ABNORMAL LOW (ref 14–54)
ANION GAP: 10 (ref 5–15)
AST: 20 U/L (ref 15–41)
Albumin: 2.6 g/dL — ABNORMAL LOW (ref 3.5–5.0)
Alkaline Phosphatase: 103 U/L (ref 38–126)
BILIRUBIN TOTAL: 0.7 mg/dL (ref 0.3–1.2)
BUN: 12 mg/dL (ref 6–20)
CALCIUM: 8.2 mg/dL — AB (ref 8.9–10.3)
CO2: 22 mmol/L (ref 22–32)
Chloride: 95 mmol/L — ABNORMAL LOW (ref 101–111)
Creatinine, Ser: 1 mg/dL (ref 0.44–1.00)
GFR calc Af Amer: >60 mL/min (ref >60)
Glucose, Bld: 136 mg/dL — ABNORMAL HIGH (ref 65–99)
POTASSIUM: 3 mmol/L — AB (ref 3.5–5.1)
Sodium: 127 mmol/L — ABNORMAL LOW (ref 135–145)
TOTAL PROTEIN: 7.3 g/dL (ref 6.5–8.1)

## 2015-12-16 LAB — BASIC METABOLIC PANEL
Anion gap: 6 (ref 5–15)
BUN: 5 mg/dL — AB (ref 6–20)
CHLORIDE: 108 mmol/L (ref 101–111)
CO2: 23 mmol/L (ref 22–32)
CREATININE: 0.84 mg/dL (ref 0.44–1.00)
Calcium: 7.5 mg/dL — ABNORMAL LOW (ref 8.9–10.3)
GFR calc non Af Amer: >60 mL/min (ref >60)
GLUCOSE: 97 mg/dL (ref 65–99)
Potassium: 2.9 mmol/L — ABNORMAL LOW (ref 3.5–5.1)
Sodium: 137 mmol/L (ref 135–145)

## 2015-12-16 LAB — URINALYSIS, ROUTINE W REFLEX MICROSCOPIC
BILIRUBIN URINE: NEGATIVE
Glucose, UA: NEGATIVE mg/dL
KETONES UR: NEGATIVE mg/dL
LEUKOCYTES UA: NEGATIVE
NITRITE: NEGATIVE
Protein, ur: 100 mg/dL — AB
SPECIFIC GRAVITY, URINE: 1.009 (ref 1.005–1.030)
pH: 6 (ref 5.0–8.0)

## 2015-12-16 LAB — I-STAT CHEM 8, ED
BUN: 12 mg/dL (ref 6–20)
CALCIUM ION: 1.06 mmol/L — AB (ref 1.15–1.40)
Chloride: 94 mmol/L — ABNORMAL LOW (ref 101–111)
Creatinine, Ser: 0.9 mg/dL (ref 0.44–1.00)
GLUCOSE: 139 mg/dL — AB (ref 65–99)
HCT: 23 % — ABNORMAL LOW (ref 36.0–46.0)
Hemoglobin: 7.8 g/dL — ABNORMAL LOW (ref 12.0–15.0)
Potassium: 3 mmol/L — ABNORMAL LOW (ref 3.5–5.1)
Sodium: 129 mmol/L — ABNORMAL LOW (ref 135–145)
TCO2: 23 mmol/L (ref 0–100)

## 2015-12-16 LAB — RAPID URINE DRUG SCREEN, HOSP PERFORMED
Amphetamines: NOT DETECTED
BARBITURATES: NOT DETECTED
Benzodiazepines: NOT DETECTED
Cocaine: POSITIVE — AB
Opiates: POSITIVE — AB
Tetrahydrocannabinol: NOT DETECTED

## 2015-12-16 LAB — CBC WITH DIFFERENTIAL/PLATELET
Basophils Absolute: 0 10*3/uL (ref 0.0–0.1)
Basophils Relative: 0 %
EOS PCT: 0 %
Eosinophils Absolute: 0 10*3/uL (ref 0.0–0.7)
HEMATOCRIT: 26.2 % — AB (ref 36.0–46.0)
Hemoglobin: 8.7 g/dL — ABNORMAL LOW (ref 12.0–15.0)
LYMPHS ABS: 2.3 10*3/uL (ref 0.7–4.0)
Lymphocytes Relative: 19 %
MCH: 26.6 pg (ref 26.0–34.0)
MCHC: 33.2 g/dL (ref 30.0–36.0)
MCV: 80.1 fL (ref 78.0–100.0)
MONO ABS: 1.2 10*3/uL — AB (ref 0.1–1.0)
MONOS PCT: 10 %
Neutro Abs: 8.8 10*3/uL — ABNORMAL HIGH (ref 1.7–7.7)
Neutrophils Relative %: 71 %
PLATELETS: 270 10*3/uL (ref 150–400)
RBC: 3.27 MIL/uL — AB (ref 3.87–5.11)
RDW: 16.8 % — AB (ref 11.5–15.5)
WBC: 12.3 10*3/uL — AB (ref 4.0–10.5)

## 2015-12-16 LAB — MRSA PCR SCREENING: MRSA by PCR: NEGATIVE

## 2015-12-16 LAB — URINE MICROSCOPIC-ADD ON

## 2015-12-16 LAB — I-STAT BETA HCG BLOOD, ED (MC, WL, AP ONLY): I-stat hCG, quantitative: <5 m[IU]/mL (ref <5)

## 2015-12-16 LAB — PREALBUMIN: Prealbumin: 2.1 mg/dL — ABNORMAL LOW (ref 18–38)

## 2015-12-16 LAB — ETHANOL: Alcohol, Ethyl (B): <5 mg/dL (ref <5)

## 2015-12-16 LAB — TROPONIN I: Troponin I: <0.03 ng/mL (ref <0.03)

## 2015-12-16 LAB — I-STAT CG4 LACTIC ACID, ED: LACTIC ACID, VENOUS: 1 mmol/L (ref 0.5–1.9)

## 2015-12-16 MED ORDER — SODIUM CHLORIDE 0.9 % IV BOLUS (SEPSIS)
1000.0000 mL | Freq: Once | INTRAVENOUS | Status: AC
  Administered 2015-12-16: 1000 mL via INTRAVENOUS

## 2015-12-16 MED ORDER — ONDANSETRON HCL 4 MG PO TABS: 4.0000 mg | ORAL_TABLET | Freq: Four times a day (QID) | ORAL | Status: DC | PRN

## 2015-12-16 MED ORDER — PANTOPRAZOLE SODIUM 40 MG PO TBEC
40.0000 mg | DELAYED_RELEASE_TABLET | Freq: Every day | ORAL | Status: DC
  Administered 2015-12-17 – 2015-12-19 (×3): 40 mg via ORAL
  Filled 2015-12-16 (×3): qty 1

## 2015-12-16 MED ORDER — POTASSIUM CHLORIDE CRYS ER 20 MEQ PO TBCR: 40.0000 meq | EXTENDED_RELEASE_TABLET | Freq: Once | ORAL | Status: DC

## 2015-12-16 MED ORDER — HEPARIN SODIUM (PORCINE) 5000 UNIT/ML IJ SOLN
5000.0000 [IU] | Freq: Three times a day (TID) | INTRAMUSCULAR | Status: DC
  Administered 2015-12-16: 5000 [IU] via SUBCUTANEOUS
  Filled 2015-12-16 (×7): qty 1

## 2015-12-16 MED ORDER — LORAZEPAM 2 MG/ML IJ SOLN
1.0000 mg | Freq: Four times a day (QID) | INTRAMUSCULAR | Status: DC | PRN
Start: 2015-12-16 — End: 2015-12-19
  Administered 2015-12-16 – 2015-12-19 (×10): 1 mg via INTRAVENOUS
  Filled 2015-12-16 (×11): qty 1

## 2015-12-16 MED ORDER — DEXTROSE 5 % IV SOLN
2.0000 g | Freq: Two times a day (BID) | INTRAVENOUS | Status: DC
  Administered 2015-12-16 – 2015-12-19 (×7): 2 g via INTRAVENOUS
  Filled 2015-12-16 (×8): qty 2

## 2015-12-16 MED ORDER — BOOST / RESOURCE BREEZE PO LIQD
1.0000 | Freq: Two times a day (BID) | ORAL | Status: DC
  Filled 2015-12-16: qty 1

## 2015-12-16 MED ORDER — ONDANSETRON HCL 4 MG/2ML IJ SOLN: 4.0000 mg | Freq: Four times a day (QID) | INTRAMUSCULAR | Status: DC | PRN

## 2015-12-16 MED ORDER — ACETAMINOPHEN 500 MG PO TABS: 1000.0000 mg | ORAL_TABLET | Freq: Four times a day (QID) | ORAL | Status: DC | PRN

## 2015-12-16 MED ORDER — SODIUM CHLORIDE 0.9 % IV SOLN
1000.0000 mL | Freq: Once | INTRAVENOUS | Status: AC
  Administered 2015-12-16: 1000 mL via INTRAVENOUS

## 2015-12-16 MED ORDER — SODIUM CHLORIDE 0.9 % IV SOLN
INTRAVENOUS | Status: AC
  Administered 2015-12-16: 10:00:00 via INTRAVENOUS

## 2015-12-16 MED ORDER — SODIUM CHLORIDE 0.9% FLUSH
3.0000 mL | Freq: Two times a day (BID) | INTRAVENOUS | Status: DC
  Administered 2015-12-16 – 2015-12-19 (×5): 3 mL via INTRAVENOUS

## 2015-12-16 MED ORDER — SODIUM CHLORIDE 0.9 % IV SOLN
3.0000 g | Freq: Four times a day (QID) | INTRAVENOUS | Status: DC
  Administered 2015-12-16 – 2015-12-19 (×14): 3 g via INTRAVENOUS
  Filled 2015-12-16 (×17): qty 3

## 2015-12-16 MED ORDER — SODIUM CHLORIDE 0.9 % IV SOLN
1000.0000 mL | INTRAVENOUS | Status: DC
  Administered 2015-12-16 – 2015-12-17 (×3): 1000 mL via INTRAVENOUS

## 2015-12-16 MED ORDER — NALOXONE HCL 0.4 MG/ML IJ SOLN: 0.4000 mg | INTRAMUSCULAR | Status: DC | PRN

## 2015-12-16 MED ORDER — KETOROLAC TROMETHAMINE 30 MG/ML IJ SOLN
30.0000 mg | Freq: Four times a day (QID) | INTRAMUSCULAR | Status: DC | PRN
  Administered 2015-12-16 – 2015-12-19 (×10): 30 mg via INTRAVENOUS
  Filled 2015-12-16 (×11): qty 1

## 2015-12-16 MED ORDER — OXYCODONE HCL 5 MG PO TABS
5.0000 mg | ORAL_TABLET | ORAL | Status: DC | PRN
Start: 2015-12-16 — End: 2015-12-19
  Administered 2015-12-16 – 2015-12-19 (×9): 5 mg via ORAL
  Filled 2015-12-16 (×11): qty 1

## 2015-12-16 MED ORDER — ACETAMINOPHEN 650 MG RE SUPP: 650.0000 mg | Freq: Four times a day (QID) | RECTAL | Status: DC | PRN

## 2015-12-16 NOTE — ED Notes (Signed)
Spoke with pharmacy, they will be re-scheduling pt.s Potassium to later this day  When pt. Is awake more.

## 2015-12-16 NOTE — ED Notes (Signed)
Patient transported to CT SCAN . 

## 2015-12-16 NOTE — Care Management (Addendum)
Patient recently discharged 8-31, to SNF on letter of guarantee, however left within four hours.  Now presents with AMS and tachycardia, possible pyleonephritis and continued endocarditis.  Per MA, Dr. Jacky KindleAronson, discharge to SNF is not an option.  Catherine ArenasSarah Arra Connaughton, RN BSN [phone]

## 2015-12-16 NOTE — Progress Notes (Signed)
Patient trasfered from ED to [phone] via stretcher; alert and oriented x 4; complaints of pain in her left ankle and lower back (patient was medicated in ED); IV in LFA and running fluids@125  cc/hr; skin intact. Orient patient to room and unit; gave patient care guide; instructed how to use the call bell and  fall risk precautions. Will continue to monitor the patient.

## 2015-12-16 NOTE — Consult Note (Signed)
[facility] for Infectious Disease  Date of Admission:  12/16/2015  Date of Consult:  12/16/2015  Reason for Consult: IVDA, recent endocarditis Referring Physician: Marily Memos  Impression/Recommendation IVDA  Endocarditis Septic Pulmonary emboli  Hypokalemia   Would Resume Catherine Hernandez previous ceftriaxone and unasyn Watch for withdrawal replete [facility] mid-line for access while in hospital.  I would not send Catherine Hernandez out on PIC line again, she is a high flight risk.  Contact psych for subs abuse outpt f/u If she leaves, send Catherine Hernandez our on augmentin.  Recheck Catherine Hernandez HIV and acute hepatitis panel. Gc/chlamydia/rpr Try give Catherine Hernandez as much IV anbx as we can, she refuses SNF.   Comment- Unclear how long to treat Catherine Hernandez- restart day count from zero? She is unlikely to take IV antibiotics for a prolonged period.   Thank you so much for this interesting consult,   Bobby Rumpf (pager) [phone] [email or web address]  Catherine Hernandez is an 30 y.o. female.  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 She was started on unasyn/ceftriaxone. Catherine Hernandez course was complicated by AKI, septic pulmonary emboli and thrombocytopenia.  She was found to have TV IE on TEE on 8-22. She was planned to stay on unasyn/ceftriaxone til 9-12. She was d/c to SNF on 8-31.  She immediately left the facilty upon arrival, after removing Catherine Hernandez PIC.  She returns today with ongoing drug use, a fall down stairs, worsening pain in Catherine Hernandez L ankle, chronic back pain.  Catherine Hernandez ER eval showed RLL consolidation (improved vs prev),  Plain films of the ankle which were negative, UDS+ for cocaine and opiates, a WCB of 12.3 and a temp that was 99.2 Catherine Hernandez BCx are pending.   Past Medical History:  Diagnosis Date  . Anemia   . CAP (community acquired pneumonia) 11/16/2015  . Childhood asthma   . Enterococcal bacteremia   . Heroin abuse   . Hyponatremia   . Pneumonia 2007  . Polysubstance  abuse     Past Surgical History:  Procedure Laterality Date  . NO PAST SURGERIES    . TEE WITHOUT CARDIOVERSION N/A 11/23/2015   Procedure: TRANSESOPHAGEAL ECHOCARDIOGRAM (TEE) WITH ANESTHESIA;  Surgeon: Sueanne Margarita, MD;  Location: [facility];  Service: Cardiovascular;  Laterality: N/A;     No Known Allergies  Medications:  Scheduled: . sodium chloride   Intravenous STAT  . ampicillin-sulbactam (UNASYN) IVPB 3 g  3 g Intravenous Q6H  . cefTRIAXone (ROCEPHIN) IVPB 2 gram/50 mL D5W (Pyxis)  2 g Intravenous Q12H  . feeding supplement  1 Container Oral BID BM  . heparin  5,000 Units Subcutaneous Q8H  . pantoprazole  40 mg Oral Q0600  . sodium chloride flush  3 mL Intravenous Q12H    Abtx:  Anti-infectives    Start     Dose/Rate Route Frequency Ordered Stop   12/16/15 1000  Ampicillin-Sulbactam (UNASYN) 3 g in sodium chloride 0.9 % 100 mL IVPB     3 g 100 mL/hr over 60 Minutes Intravenous Every 6 hours 12/16/15 0906     12/16/15 1000  cefTRIAXone (ROCEPHIN) 2 g in dextrose 5 % 50 mL IVPB     2 g 100 mL/hr over 30 Minutes Intravenous Every 12 hours 12/16/15 0906        Total days of antibiotics: 0 unasyn/ceftriaxone          Social History:  reports that she has been smoking Cigarettes.  She has a 5.00 pack-year smoking history. She has never used smokeless tobacco. She reports that she uses drugs, including IV, Cocaine, Heroin, and Oxycodone. She reports that she does not drink alcohol.  Family History  Problem Relation Age of Onset  . Anesthesia problems Neg Hx   . Hypotension Neg Hx   . Malignant hyperthermia Neg Hx   . Pseudochol deficiency Neg Hx     General ROS: no f/c, no sob, no cough, no CP, normal urination, normal BM, see HPI.   Blood pressure 122/62, pulse 115, temperature 99 F (37.2 C), temperature source Oral, resp. rate 18, height _0  (1.626 m), weight 43.1 kg (95 lb), SpO2 98 %. General appearance: alert and mild distress Eyes: negative findings:  conjunctivae and sclerae normal and pupils equal, round, reactive to light and accomodation Throat: normal findings: oropharynx pink & moist without lesions or evidence of thrush and abnormal findings: dentition: poor Neck: no adenopathy and supple, symmetrical, trachea midline Lungs: clear to auscultation bilaterally Heart: regular rate and rhythm Abdomen: normal findings: bowel sounds normal and soft, non-tender Extremities: edema none, multiple scars.  Skin: ~2cm superfical wound on Catherine Hernandez lower back. no fluctuance. diffuse tenderness.    Results for orders placed or performed during the hospital encounter of 12/16/15 (from the past 48 hour(s))  I-Stat Beta hCG blood, ED ([facility], [facility], [facility] only)     Status: None   Collection Time: 12/16/15  5:44 AM  Result Value Ref Range   I-stat hCG, quantitative <5.0 <5 mIU/mL   Comment 3            Comment:   GEST. AGE      CONC.  (mIU/mL)   <=1 WEEK        5 - 50     2 WEEKS       50 - 500     3 WEEKS       100 - 10,000     4 WEEKS     1,000 - 30,000        FEMALE AND NON-PREGNANT FEMALE:     LESS THAN 5 mIU/mL   I-stat chem 8, ed     Status: Abnormal   Collection Time: 12/16/15  5:46 AM  Result Value Ref Range   Sodium 129 (L) 135 - 145 mmol/L   Potassium 3.0 (L) 3.5 - 5.1 mmol/L   Chloride 94 (L) 101 - 111 mmol/L   BUN 12 6 - 20 mg/dL   Creatinine, Ser 0.90 0.44 - 1.00 mg/dL   Glucose, Bld 139 (H) 65 - 99 mg/dL   Calcium, Ion 1.06 (L) 1.15 - 1.40 mmol/L   TCO2 23 0 - 100 mmol/L   Hemoglobin 7.8 (L) 12.0 - 15.0 g/dL   HCT 23.0 (L) 36.0 - 46.0 %  Ethanol     Status: None   Collection Time: 12/16/15  5:48 AM  Result Value Ref Range   Alcohol, Ethyl (B) <5 <5 mg/dL    Comment:        LOWEST DETECTABLE LIMIT FOR SERUM ALCOHOL IS 5 mg/dL FOR MEDICAL PURPOSES ONLY   CBC with Differential     Status: Abnormal   Collection Time: 12/16/15  5:49 AM  Result Value Ref Range   WBC 12.3 (H) 4.0 - 10.5 K/uL   RBC 3.27 (L) 3.87 - 5.11 MIL/uL    Hemoglobin 8.7 (L) 12.0 - 15.0 g/dL   HCT 26.2 (L) 36.0 - 46.0 %   MCV 80.1 78.0 -  100.0 fL   MCH 26.6 26.0 - 34.0 pg   MCHC 33.2 30.0 - 36.0 g/dL   RDW 16.8 (H) 11.5 - 15.5 %   Platelets 270 150 - 400 K/uL   Neutrophils Relative % 71 %   Lymphocytes Relative 19 %   Monocytes Relative 10 %   Eosinophils Relative 0 %   Basophils Relative 0 %   Neutro Abs 8.8 (H) 1.7 - 7.7 K/uL   Lymphs Abs 2.3 0.7 - 4.0 K/uL   Monocytes Absolute 1.2 (H) 0.1 - 1.0 K/uL   Eosinophils Absolute 0.0 0.0 - 0.7 K/uL   Basophils Absolute 0.0 0.0 - 0.1 K/uL   WBC Morphology ATYPICAL LYMPHOCYTES     Comment: RARE  Comprehensive metabolic panel     Status: Abnormal   Collection Time: 12/16/15  5:49 AM  Result Value Ref Range   Sodium 127 (L) 135 - 145 mmol/L   Potassium 3.0 (L) 3.5 - 5.1 mmol/L   Chloride 95 (L) 101 - 111 mmol/L   CO2 22 22 - 32 mmol/L   Glucose, Bld 136 (H) 65 - 99 mg/dL   BUN 12 6 - 20 mg/dL   Creatinine, Ser 1.00 0.44 - 1.00 mg/dL   Calcium 8.2 (L) 8.9 - 10.3 mg/dL   Total Protein 7.3 6.5 - 8.1 g/dL   Albumin 2.6 (L) 3.5 - 5.0 g/dL   AST 20 15 - 41 U/L   ALT 13 (L) 14 - 54 U/L   Alkaline Phosphatase 103 38 - 126 U/L   Total Bilirubin 0.7 0.3 - 1.2 mg/dL   GFR calc non Af Amer >60 >60 mL/min   GFR calc Af Amer >60 >60 mL/min    Comment: (NOTE) The eGFR has been calculated using the CKD EPI equation. This calculation has not been validated in all clinical situations. eGFR's persistently <60 mL/min signify possible Chronic Kidney Disease.    Anion gap 10 5 - 15  Urine rapid drug screen (hosp performed)     Status: Abnormal   Collection Time: 12/16/15  5:50 AM  Result Value Ref Range   Opiates POSITIVE (A) NONE DETECTED   Cocaine POSITIVE (A) NONE DETECTED   Benzodiazepines NONE DETECTED NONE DETECTED   Amphetamines NONE DETECTED NONE DETECTED   Tetrahydrocannabinol NONE DETECTED NONE DETECTED   Barbiturates NONE DETECTED NONE DETECTED    Comment:        DRUG SCREEN FOR  MEDICAL PURPOSES ONLY.  IF CONFIRMATION IS NEEDED FOR ANY PURPOSE, NOTIFY LAB WITHIN 5 DAYS.        LOWEST DETECTABLE LIMITS FOR URINE DRUG SCREEN Drug Class       Cutoff (ng/mL) Amphetamine      1000 Barbiturate      200 Benzodiazepine   932 Tricyclics       671 Opiates          300 Cocaine          300 THC              50   Urinalysis, Routine w reflex microscopic     Status: Abnormal   Collection Time: 12/16/15  5:50 AM  Result Value Ref Range   Color, Urine YELLOW YELLOW   APPearance CLEAR CLEAR   Specific Gravity, Urine 1.009 1.005 - 1.030   pH 6.0 5.0 - 8.0   Glucose, UA NEGATIVE NEGATIVE mg/dL   Hgb urine dipstick LARGE (A) NEGATIVE   Bilirubin Urine NEGATIVE NEGATIVE   Ketones, ur NEGATIVE NEGATIVE  mg/dL   Protein, ur 100 (A) NEGATIVE mg/dL   Nitrite NEGATIVE NEGATIVE   Leukocytes, UA NEGATIVE NEGATIVE  Urine microscopic-add on     Status: Abnormal   Collection Time: 12/16/15  5:50 AM  Result Value Ref Range   Squamous Epithelial / LPF 6-30 (A) NONE SEEN   WBC, UA 6-30 0 - 5 WBC/hpf   RBC / HPF TOO NUMEROUS TO COUNT 0 - 5 RBC/hpf   Bacteria, UA MANY (A) NONE SEEN   Casts GRANULAR CAST (A) NEGATIVE  I-Stat CG4 Lactic Acid, ED     Status: None   Collection Time: 12/16/15  5:51 AM  Result Value Ref Range   Lactic Acid, Venous 1.00 0.5 - 1.9 mmol/L      Component Value Date/Time   SDES BLOOD LEFT ARM 11/26/2015 1210   SPECREQUEST IN PEDIATRIC BOTTLE  3CC 11/26/2015 1210   CULT NO GROWTH 5 DAYS 11/26/2015 1210   REPTSTATUS 12/01/2015 FINAL 11/26/2015 1210   Dg Chest 2 View  Result Date: 12/16/2015 CLINICAL DATA:  Back pain. History of heroin use. Altered mental status. EXAM: CHEST  2 VIEW COMPARISON:  Chest radiograph 11/22/2015 FINDINGS: Shallow lung inflation.  Unchanged cardiomediastinal contours. No pneumothorax or sizable pleural effusion. Airspace opacities in the right lower lobe are unchanged. Previously described cavitary right upper lobe lesion is not  clearly seen on this study. IMPRESSION: Right lower lobe consolidation, unchanged from most recent chest radiograph, decreased compared to more remote priors such as 11/19/2015. Electronically Signed   By: Ulyses Jarred M.D.   On: 12/16/2015 06:51   Dg Ankle Complete Left  Result Date: 12/16/2015 CLINICAL DATA:  Twisting injury today. Fell down 10 steps last week. EXAM: LEFT ANKLE COMPLETE - 3+ VIEW COMPARISON:  None. FINDINGS: There is no evidence of fracture, dislocation, or joint effusion. There is no evidence of arthropathy or other focal bone abnormality. Soft tissues are unremarkable. IMPRESSION: Negative. Electronically Signed   By: Andreas Newport M.D.   On: 12/16/2015 06:48   Ct Head Wo Contrast  Result Date: 12/16/2015 CLINICAL DATA:  History of airway tubes.  Altered mental status. EXAM: CT HEAD WITHOUT CONTRAST TECHNIQUE: Contiguous axial images were obtained from the base of the skull through the vertex without intravenous contrast. COMPARISON:  Head CT 04/10/2013 FINDINGS: Brain: No mass lesion, intraparenchymal hemorrhage or extra-axial collection. No evidence of acute cortical infarct. Brain parenchyma and CSF-containing spaces are normal for age. Vascular: No hyperdense vessel or atherosclerotic calcification. Skull: Normal visualized skull base, calvarium and extracranial soft tissues. Sinuses/Orbits: No sinus fluid levels or advanced mucosal thickening. No mastoid effusion. Normal orbits. IMPRESSION: Normal head CT. Electronically Signed   By: Ulyses Jarred M.D.   On: 12/16/2015 06:57   No results found for this or any previous visit (from the past 240 hour(s)).    12/16/2015, 3:27 PM     LOS: 0 days    Records and images were personally reviewed where available.

## 2015-12-16 NOTE — ED Provider Notes (Signed)
[facility]-EMERGENCY DEPT Provider Note   CSN: [id number] Arrival date & time: 12/16/15  0427     History   Chief Complaint Chief Complaint  Patient presents with  . Ankle Pain    HPI Catherine Hernandez is a 30 y.o. female.  The history is provided by the patient and a friend. The history is limited by the condition of the patient (Altered mental status).  Ankle Pain    Patient is a very poor historian. She is complaining of some pain in her right lateral abdomen and also in her left ankle. She states that she twisted her ankle but hasn't not able to give any further history. She is unable to give any further history regarding her abdominal pain. There is no obvious vomiting. She does have history of cocaine and heroin abuse and had used cocaine earlier tonight. The end was with her states that the last time he saw her was 1 week ago and she seems normal then. She seems "out of it" tonight.  6:03 AM Heart rate has come down to 120. Patient is slightly more cooperative and should she does admit that she stopped her antibiotics shortly after being discharged from the hospital. According to the discharge note, she was supposed to be on antibiotics through September 15 - she was discharged on August 31.  Past Medical History:  Diagnosis Date  . CAP (community acquired pneumonia) 11/16/2015  . Childhood asthma   . Heroin abuse   . Pneumonia 2007  . Polysubstance abuse     Patient Active Problem List   Diagnosis Date Noted  . Tachypnea   . Dyspnea   . 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 Polysubstance abuse   . Enterococcal bacteremia 11/18/2015  . Staphylococcus aureus bacteremia 11/18/2015  . Normocytic anemia 11/18/2015  . Protein-calorie malnutrition (HCC) 11/18/2015  . Poor dentition 11/18/2015  . Malnutrition of moderate degree 11/18/2015  . AKI (acute kidney injury) (HCC)   . CAP (community acquired pneumonia) 11/17/2015  . Polysubstance dependence including opioid type  drug with complication, continuous use (HCC) 11/17/2015  . Opioid use with withdrawal (HCC) 11/17/2015  . Hyponatremia 11/17/2015  . Acute renal failure (HCC) 11/17/2015  . Hypocalcemia 11/17/2015  . Nonspecific abnormal electrocardiogram (ECG) (EKG) 11/17/2015  . Thrombocytopenia (HCC) 11/17/2015  . Sepsis (HCC) 11/16/2015    Past Surgical History:  Procedure Laterality Date  . NO PAST SURGERIES    . TEE WITHOUT CARDIOVERSION N/A 11/23/2015   Procedure: TRANSESOPHAGEAL ECHOCARDIOGRAM (TEE) WITH ANESTHESIA;  Surgeon: Quintella Reichertraci R Turner, MD;  Location: [facility] ENDOS[facility];  Service: Cardiovascular;  Laterality: N/A;    OB History    Gravida Para Term Preterm AB Living   1 1 1  0 0 1   SAB TAB Ectopic Multiple Live Births   0 0 0 0 1       Home Medications    Prior to Admission medications   Medication Sig Start Date End Date Taking? Authorizing Provider  Ampicillin-Sulbactam 3 g in sodium chloride 0.9 % 100 mL Inject 3 g into the vein every 6 (six) hours. 12/02/15   Clydia LlanoMutaz Elmahi, MD  cefTRIAXone 2 g in dextrose 5 % 50 mL Inject 2 g into the vein every 12 (twelve) hours. 12/02/15   Clydia LlanoMutaz Elmahi, MD  feeding supplement (BOOST / RESOURCE BREEZE) LIQD Take 1 Container by mouth 2 (two) times daily between meals. 12/02/15   Clydia LlanoMutaz Elmahi, MD  ibuprofen (ADVIL,MOTRIN) 800 MG tablet Take 1 tablet (800 mg total)  by mouth every 8 (eight) hours as needed for fever, headache or mild pain. 12/02/15   Clydia Llano, MD  pantoprazole (PROTONIX) 40 MG tablet Take 1 tablet (40 mg total) by mouth daily at 6 (six) AM. 12/02/15   Clydia Llano, MD    Family History Family History  Problem Relation Age of Onset  . Anesthesia problems Neg Hx   . Hypotension Neg Hx   . Malignant hyperthermia Neg Hx   . Pseudochol deficiency Neg Hx     Social History Social History  Substance Use Topics  . Smoking status: Current Every Day Smoker    Packs/day: 0.50    Years: 10.00    Types: Cigarettes  . Smokeless tobacco:  Never Used  . Alcohol use No     Allergies   Review of patient's allergies indicates no known allergies.   Review of Systems Review of Systems  Unable to perform ROS: Mental status change     Physical Exam Updated Vital Signs BP 138/95 (BP Location: Left Arm)   Pulse (!) 156   Temp 98.9 F (37.2 C) (Oral)   Resp 22   Ht 5\' 4"  (1.626 m)   Wt 95 lb (43.1 kg)   SpO2 100%   BMI 16.31 kg/m   Physical Exam  Nursing note and vitals reviewed.  Cachectic and restless 30 year old female, resting comfortably and in no acute distress. Vital signs are significant for mild hypertension and tachypnea, severe tachycardia. Oxygen saturation is 100%, which is normal. Head is normocephalic and atraumatic. PERRLA, EOMI. Oropharynx is clear. Neck is nontender and supple without adenopathy or JVD. Back is nontender and there is no CVA tenderness. Lungs are clear without rales, wheezes, or rhonchi. Chest is nontender. Heart has regular rate and rhythm without murmur. Abdomen is soft, flat, with mild tenderness in the right upper and lateral abdomen. There is no rebound or guarding. There are no masses or hepatosplenomegaly and peristalsis is normoactive. Extremities: Moderate erythema and swelling of the left ankle. There is pain on range of motion and also tenderness to palpation over the lateral aspect of the ankle. Skin is warm and dry without rash. Neurologic: She is awake and will answer questions but is not able to stay on task, cranial nerves are intact, there are no gross motor or sensory deficits.  ED Treatments / Results  Labs (all labs ordered are listed, but only abnormal results are displayed) Labs Reviewed  CBC WITH DIFFERENTIAL/PLATELET - Abnormal; Notable for the following:       Result Value   WBC 12.3 (*)    RBC 3.27 (*)    Hemoglobin 8.7 (*)    HCT 26.2 (*)    RDW 16.8 (*)    Neutro Abs 8.8 (*)    Monocytes Absolute 1.2 (*)    All other components within normal  limits  URINE RAPID DRUG SCREEN, HOSP PERFORMED - Abnormal; Notable for the following:    Opiates POSITIVE (*)    Cocaine POSITIVE (*)    All other components within normal limits  URINALYSIS, ROUTINE W REFLEX MICROSCOPIC (NOT AT [facility]) - Abnormal; Notable for the following:    Hgb urine dipstick LARGE (*)    Protein, ur 100 (*)    All other components within normal limits  COMPREHENSIVE METABOLIC PANEL - Abnormal; Notable for the following:    Sodium 127 (*)    Potassium 3.0 (*)    Chloride 95 (*)    Glucose, Bld 136 (*)  Calcium 8.2 (*)    Albumin 2.6 (*)    ALT 13 (*)    All other components within normal limits  URINE MICROSCOPIC-ADD ON - Abnormal; Notable for the following:    Squamous Epithelial / LPF 6-30 (*)    Bacteria, UA MANY (*)    Casts GRANULAR CAST (*)    All other components within normal limits  I-STAT CHEM 8, ED - Abnormal; Notable for the following:    Sodium 129 (*)    Potassium 3.0 (*)    Chloride 94 (*)    Glucose, Bld 139 (*)    Calcium, Ion 1.06 (*)    Hemoglobin 7.8 (*)    HCT 23.0 (*)    All other components within normal limits  CULTURE, BLOOD (ROUTINE X 2)  CULTURE, BLOOD (ROUTINE X 2)  URINE CULTURE  ETHANOL  I-STAT CG4 LACTIC ACID, ED  I-STAT BETA HCG BLOOD, ED ([facility], [facility], [facility] ONLY)    EKG  EKG Interpretation  Date/Time:  Thursday December 16 2015 04:57:42 EDT Ventricular Rate:  140 PR Interval:    QRS Duration: 95 QT Interval:  292 QTC Calculation: 446 R Axis:   106 Text Interpretation:  Sinus tachycardia Borderline right axis deviation Borderline T wave abnormalities When compared with ECG of 11/17/2015, HEART RATE has increased Confirmed by Preston Fleeting  MD, Kosha Jaquith ([id number]) on 12/16/2015 5:10:01 AM       Radiology Dg Chest 2 View  Result Date: 12/16/2015 CLINICAL DATA:  Back pain. History of heroin use. Altered mental status. EXAM: CHEST  2 VIEW COMPARISON:  Chest radiograph 11/22/2015 FINDINGS: Shallow lung inflation.  Unchanged  cardiomediastinal contours. No pneumothorax or sizable pleural effusion. Airspace opacities in the right lower lobe are unchanged. Previously described cavitary right upper lobe lesion is not clearly seen on this study. IMPRESSION: Right lower lobe consolidation, unchanged from most recent chest radiograph, decreased compared to more remote priors such as 11/19/2015. Electronically Signed   By: Deatra Robinson M.D.   On: 12/16/2015 06:51   Dg Ankle Complete Left  Result Date: 12/16/2015 CLINICAL DATA:  Twisting injury today. Fell down 10 steps last week. EXAM: LEFT ANKLE COMPLETE - 3+ VIEW COMPARISON:  None. FINDINGS: There is no evidence of fracture, dislocation, or joint effusion. There is no evidence of arthropathy or other focal bone abnormality. Soft tissues are unremarkable. IMPRESSION: Negative. Electronically Signed   By: Ellery Plunk M.D.   On: 12/16/2015 06:48   Ct Head Wo Contrast  Result Date: 12/16/2015 CLINICAL DATA:  History of airway tubes.  Altered mental status. EXAM: CT HEAD WITHOUT CONTRAST TECHNIQUE: Contiguous axial images were obtained from the base of the skull through the vertex without intravenous contrast. COMPARISON:  Head CT 04/10/2013 FINDINGS: Brain: No mass lesion, intraparenchymal hemorrhage or extra-axial collection. No evidence of acute cortical infarct. Brain parenchyma and CSF-containing spaces are normal for age. Vascular: No hyperdense vessel or atherosclerotic calcification. Skull: Normal visualized skull base, calvarium and extracranial soft tissues. Sinuses/Orbits: No sinus fluid levels or advanced mucosal thickening. No mastoid effusion. Normal orbits. IMPRESSION: Normal head CT. Electronically Signed   By: Deatra Robinson M.D.   On: 12/16/2015 06:57    Procedures Procedures (including critical care time) CENTRAL LINE Performed by: YQMVH,QIONG Consent: The procedure was performed in an emergent situation. Required items: required blood products, implants,  devices, and special equipment available Patient identity confirmed: arm band and provided demographic data Time out: Immediately prior to procedure a "time out" was called to verify  the correct patient, procedure, equipment, support staff and site/side marked as required. Indications: vascular access Anesthesia: local infiltration Local anesthetic: lidocaine 1% with epinephrine Anesthetic total: 3 ml Patient sedated: no Preparation: skin prepped with 2% chlorhexidine Skin prep agent dried: skin prep agent completely dried prior to procedure Sterile barriers: all five maximum sterile barriers used - cap, mask, sterile gown, sterile gloves, and large sterile sheet Hand hygiene: hand hygiene performed prior to central venous catheter insertion  Location details: Right external jugular vein   Catheter type: Angiocath  Catheter size: #20 Pre-procedure: landmarks identified Ultrasound guidance: No  Successful placement: yes Post-procedure: line taped in place and dressing applied Assessment: Successful blood draw, free fluid flow Patient tolerance: Patient tolerated the procedure well with no immediate complications.  CRITICAL CARE Performed by: Dione Booze Total critical care time: 50 minutes Critical care time was exclusive of separately billable procedures and treating other patients. Critical care was necessary to treat or prevent imminent or life-threatening deterioration. Critical care was time spent personally by me on the following activities: development of treatment plan with patient and/or surrogate as well as nursing, discussions with consultants, evaluation of patient's response to treatment, examination of patient, obtaining history from patient or surrogate, ordering and performing treatments and interventions, ordering and review of laboratory studies, ordering and review of radiographic studies, pulse oximetry and re-evaluation of patient's condition.   Medications Ordered  in ED Medications  0.9 %  sodium chloride infusion (0 mLs Intravenous Stopped 12/16/15 0600)    Followed by  0.9 %  sodium chloride infusion (1,000 mLs Intravenous New Bag/Given 12/16/15 0517)  sodium chloride 0.9 % bolus 1,000 mL (not administered)  potassium chloride SA (K-DUR,KLOR-CON) CR tablet 40 mEq (not administered)     Initial Impression / Assessment and Plan / ED Course  I have reviewed the triage vital signs and the nursing notes.  Pertinent labs & imaging results that were available during my care of the patient were reviewed by me and considered in my medical decision making (see chart for details).  Clinical Course   Apparent left ankle injury. Abdominal pain of uncertain cause. Altered mental status. Tachycardia. 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 Her altered mentation today could be from trauma, infection, drug use. She'll be sent for CT of head and x-rays are obtained of chest and left ankle. She's given IV hydration and blood cultures obtained. She is not febrile or hypotensive and there is no obvious source of infection, so code sepsis is not initially activated. Urine is sent for drug screen.  Urinalysis does have significant bacturia and is sent for culture. Heart rate has come down to 115 with initial hydration and she is given additional IV fluids. Urine drug screen is come back positive for cocaine and opiates. CT of head shows no significant findings. Chest x-ray shows a slowly resolving infiltrate with no new findings. Lactic acid level is normal. Ankle x-ray shows no evidence of fracture. She is placed in an ankle splint orthotic. She will need to be admitted for blood cultures and repeat echocardiogram and consultation with infectious disease to determine whether she needs ongoing antibiotics. Case is discussed with Dr. Konrad Dolores of [facility] who agrees to admit the  patient.  Final Clinical Impressions(s) / ED Diagnoses   Final diagnoses:  Altered mental status, unspecified altered mental status type  Polysubstance abuse  Normochromic normocytic anemia  Hyponatremia  Hypokalemia  Sprain of  left ankle, initial encounter  Noncompliance with medication regimen    New Prescriptions New Prescriptions   No medications on file     Dione Booze, MD 12/16/15 [phone]

## 2015-12-16 NOTE — Progress Notes (Signed)
This CSW has staffed case with Unit CSW. This CSW signing off.          Lance MussAshley Gardner,MSW, LCSW [facility] Clinical Social Worker [phone]

## 2015-12-16 NOTE — ED Notes (Signed)
Dr. Evelena PeatMerrel at the bedside

## 2015-12-16 NOTE — Progress Notes (Signed)
Patient refuses lab draw at this time. Patient said she will agree to lab draw in the morning but she is "tired of it tonight".  On-call coverage, Kim notified.  Will continue to monitor and notify as needed.

## 2015-12-16 NOTE — H&P (Signed)
History and Physical    Catherine Hernandez:[id number] DOB: 02-Sep-1985 DOA: 12/16/2015  PCP: No PCP Per Patient Patient coming from: home  Chief Complaint: L ankle pain and side pain  HPI: Catherine Hernandez is a 30 y.o. female with medical history significant of ongoing heroin and cocaine use, tricuspid valve vegetations, enterococcal bacteremia. Of note patient discharged on 12/02/2015 after prolonged hospitalization secondary to sepsis from bacteremia with metastatic infection, tricuspid valve endocarditis, septic pulmonary emboli, AK I, hyponatremia, thrombocytopenia, and ongoing drug use/withdrawal. Patient was [facility] in [place]. I called to discuss patient's case with the facility. Per report patient was received by [facility] on 12/02/2015 at 1620 in the afternoon but left at 2030 on the same day. Patient refused therapies and had a friend come pick her up. Patient had her PICC line removed prior to discharge.   Level V caveat applies at this point time is patient is unable to provide reliable history given her mental status which is likely affected by her ongoing drug use.   Patient does endorse using cocaine and heroin less than 24 hours ago. No family or friends with patient at time of admission. Patient with vague history, but unable to provide consistent details. History may include all the following, mechanical fall down 10 steps several days ago without LOC, chronic back pain with recent worsening associated with flank pain, hematuria and dysuria, not taking antibiotics after time of discharge, intermittent shortness of breath, general "sick" feeling for the last 2-3 days, and left ankle pain of unknown period of time. Patient does deny chest pain, palpitations, nausea, vomiting, diarrhea, fevers.   ED Course: IVF ordered. Objective findings outlined below.   Review of Systems: As per HPI otherwise 10 point review of systems  negative.   Ambulatory Status:no restrictions   Past Medical History:  Diagnosis Date  . Anemia   . CAP (community acquired pneumonia) 11/16/2015  . Childhood asthma   . Enterococcal bacteremia   . Heroin abuse   . Hyponatremia   . Pneumonia 2007  . Polysubstance abuse     Past Surgical History:  Procedure Laterality Date  . NO PAST SURGERIES    . TEE WITHOUT CARDIOVERSION N/A 11/23/2015   Procedure: TRANSESOPHAGEAL ECHOCARDIOGRAM (TEE) WITH ANESTHESIA;  Surgeon: Quintella Reichert, MD;  Location: [facility];  Service: Cardiovascular;  Laterality: N/A;    Social History   Social History  . Marital status: Single    Spouse name: N/A  . Number of children: N/A  . Years of education: N/A   Occupational History  . unemployed, former Film/video editor    Social History Main Topics  . Smoking status: Current Every Day Smoker    Packs/day: 0.50    Years: 10.00    Types: Cigarettes  . Smokeless tobacco: Never Used  . Alcohol use No  . Drug use:     Types: IV, Cocaine, Heroin, Oxycodone     Comment: 11/17/2015 "been only doing pills lately to try to get offf H"  . Sexual activity: Yes    Birth control/ protection: Condom   Other Topics Concern  . Not on file   Social History Narrative  . No narrative on file    No Known Allergies  Family History  Problem Relation Age of Onset  . Anesthesia problems Neg Hx   . Hypotension Neg Hx   . Malignant hyperthermia Neg Hx   . Pseudochol deficiency Neg Hx  Prior to Admission medications   Medication Sig Start Date End Date Taking? Authorizing Provider  Ampicillin-Sulbactam 3 g in sodium chloride 0.9 % 100 mL Inject 3 g into the vein every 6 (six) hours. 12/02/15   Clydia LlanoMutaz Elmahi, MD  cefTRIAXone 2 g in dextrose 5 % 50 mL Inject 2 g into the vein every 12 (twelve) hours. 12/02/15   Clydia LlanoMutaz Elmahi, MD  feeding supplement (BOOST / RESOURCE BREEZE) LIQD Take 1 Container by mouth 2 (two) times daily between meals. 12/02/15   Clydia LlanoMutaz  Elmahi, MD  ibuprofen (ADVIL,MOTRIN) 800 MG tablet Take 1 tablet (800 mg total) by mouth every 8 (eight) hours as needed for fever, headache or mild pain. 12/02/15   Clydia LlanoMutaz Elmahi, MD  pantoprazole (PROTONIX) 40 MG tablet Take 1 tablet (40 mg total) by mouth daily at 6 (six) AM. 12/02/15   Clydia LlanoMutaz Elmahi, MD    Physical Exam: Vitals:   12/16/15 0730 12/16/15 0745 12/16/15 0800 12/16/15 0815  BP: 123/77 122/71 122/74 116/76  Pulse: 115 114 107 109  Resp: 17 18 19 21   Temp:      TempSrc:      SpO2: 97% 96% 97% 99%  Weight:      Height:         General: Lying in bed cachectic appearing, somewhat anxious Eyes: Post dilated and sluggish, PERRL, EOMI,  ENT: Very poor dentition, dry mucous membranes Neck:  no LAD, masses or thyromegaly Cardiovascular:  RRR, 2/6 systolic and diastolic murmurs, no JVD Respiratory:  Normal effort, few crackles and decreased sounds in the bases bilaterally with R>L. Abdomen:  soft, ntnd, NABS Skin: Numerous track marks throughout extremities, no rash Musculoskeletal: Left ankle swollen and tender to palpation. Increased area of tenderness around the ATFL insertion site. No other bony abnormalities.  Psychiatric: Patient seems sluggish in her speech and mentation. Difficulty with recalling information. Somewhat agitated. Neurologic:  CN 2-12 grossly intact, moves all extremities in coordinated fashion, sensation intact  Labs on Admission: I have personally reviewed following labs and imaging studies  CBC:  Recent Labs Lab 12/16/15 0546 12/16/15 0549  WBC  --  12.3*  NEUTROABS  --  8.8*  HGB 7.8* 8.7*  HCT 23.0* 26.2*  MCV  --  80.1  PLT  --  270   Basic Metabolic Panel:  Recent Labs Lab 12/16/15 0546 12/16/15 0549  NA 129* 127*  K 3.0* 3.0*  CL 94* 95*  CO2  --  22  GLUCOSE 139* 136*  BUN 12 12  CREATININE 0.90 1.00  CALCIUM  --  8.2*   GFR: Estimated Creatinine Clearance: 57 mL/min (by C-G formula based on SCr of 1 mg/dL). Liver  Function Tests:  Recent Labs Lab 12/16/15 0549  AST 20  ALT 13*  ALKPHOS 103  BILITOT 0.7  PROT 7.3  ALBUMIN 2.6*   No results for input(s): LIPASE, AMYLASE in the last 168 hours. No results for input(s): AMMONIA in the last 168 hours. Coagulation Profile: No results for input(s): INR, PROTIME in the last 168 hours. Cardiac Enzymes: No results for input(s): CKTOTAL, CKMB, CKMBINDEX, TROPONINI in the last 168 hours. BNP (last 3 results) No results for input(s): PROBNP in the last 8760 hours. HbA1C: No results for input(s): HGBA1C in the last 72 hours. CBG: No results for input(s): GLUCAP in the last 168 hours. Lipid Profile: No results for input(s): CHOL, HDL, LDLCALC, TRIG, CHOLHDL, LDLDIRECT in the last 72 hours. Thyroid Function Tests: No results for input(s): TSH, T4TOTAL,  FREET4, T3FREE, THYROIDAB in the last 72 hours. Anemia Panel: No results for input(s): VITAMINB12, FOLATE, FERRITIN, TIBC, IRON, RETICCTPCT in the last 72 hours. Urine analysis:    Component Value Date/Time   COLORURINE YELLOW 12/16/2015 0550   APPEARANCEUR CLEAR 12/16/2015 0550   LABSPEC 1.009 12/16/2015 0550   PHURINE 6.0 12/16/2015 0550   GLUCOSEU NEGATIVE 12/16/2015 0550   HGBUR LARGE (A) 12/16/2015 0550   BILIRUBINUR NEGATIVE 12/16/2015 0550   KETONESUR NEGATIVE 12/16/2015 0550   PROTEINUR 100 (A) 12/16/2015 0550   UROBILINOGEN 0.2 01/07/2012 1140   NITRITE NEGATIVE 12/16/2015 0550   LEUKOCYTESUR NEGATIVE 12/16/2015 0550    Creatinine Clearance: Estimated Creatinine Clearance: 57 mL/min (by C-G formula based on SCr of 1 mg/dL).  Sepsis Labs: @LABRCNTIP (procalcitonin:4,lacticidven:4) )No results found for this or any previous visit (from the past 240 hour(s)).   Radiological Exams on Admission: Dg Chest 2 View  Result Date: 12/16/2015 CLINICAL DATA:  Back pain. History of heroin use. Altered mental status. EXAM: CHEST  2 VIEW COMPARISON:  Chest radiograph 11/22/2015 FINDINGS: Shallow  lung inflation.  Unchanged cardiomediastinal contours. No pneumothorax or sizable pleural effusion. Airspace opacities in the right lower lobe are unchanged. Previously described cavitary right upper lobe lesion is not clearly seen on this study. IMPRESSION: Right lower lobe consolidation, unchanged from most recent chest radiograph, decreased compared to more remote priors such as 11/19/2015. Electronically Signed   By: Deatra Robinson M.D.   On: 12/16/2015 06:51   Dg Ankle Complete Left  Result Date: 12/16/2015 CLINICAL DATA:  Twisting injury today. Fell down 10 steps last week. EXAM: LEFT ANKLE COMPLETE - 3+ VIEW COMPARISON:  None. FINDINGS: There is no evidence of fracture, dislocation, or joint effusion. There is no evidence of arthropathy or other focal bone abnormality. Soft tissues are unremarkable. IMPRESSION: Negative. Electronically Signed   By: Ellery Plunk M.D.   On: 12/16/2015 06:48   Ct Head Wo Contrast  Result Date: 12/16/2015 CLINICAL DATA:  History of airway tubes.  Altered mental status. EXAM: CT HEAD WITHOUT CONTRAST TECHNIQUE: Contiguous axial images were obtained from the base of the skull through the vertex without intravenous contrast. COMPARISON:  Head CT 04/10/2013 FINDINGS: Brain: No mass lesion, intraparenchymal hemorrhage or extra-axial collection. No evidence of acute cortical infarct. Brain parenchyma and CSF-containing spaces are normal for age. Vascular: No hyperdense vessel or atherosclerotic calcification. Skull: Normal visualized skull base, calvarium and extracranial soft tissues. Sinuses/Orbits: No sinus fluid levels or advanced mucosal thickening. No mastoid effusion. Normal orbits. IMPRESSION: Normal head CT. Electronically Signed   By: Deatra Robinson M.D.   On: 12/16/2015 06:57    EKG: Independently reviewed.   Assessment/Plan Active Problems:   Polysubstance dependence including opioid type drug with complication, continuous use (HCC)   Hyponatremia    Enterococcal bacteremia   Staphylococcus aureus bacteremia   Protein-calorie malnutrition (HCC)   Altered mental status   Ankle sprain   Tricuspid valve vegetation   Acute encephalopathy: Likely secondary to ongoing cocaine and heroine use. Patient appears to be actively infected by these drugs given the fact that her last use was less than 24 hours ago. Cannot rule out compounding effects of infection as patient likely to have UTI/pyelonephritis. Patient may also have bacteremia she was inadequately treated for her bacteremia with tricuspid valve vegetations and septic pulmonary emboli. Doubt intracranial process but cannot entirely rule out. CT head negative. - Infectious workup as below - Narcan when necessary - MRI head if not improving  Bacteremia: +  Cultures for MSSA and Enterococcal faecalis. Patient admitted from 11/17/2015 until 12/02/2015 for bacteremia, tricuspid valve vegetations and septic pulmonary emboli. ID consult during previous admission. Recommendation was for patient to complete 2 week outpatient course of Rocephin and Unasyn. WBC 12.3 with left shift. Normal lactic acid. Patient discharged with a pick to skilled nursing facility. Patient left that facility after only 4 hours due to refusal of care. PICC line was removed. No further antibiotics since her last dose the morning of 12/01/2025 doubt that patient is currently bacteremic but cannot exclude at this time.  - ID consult - Resume Rocephin and Unasyn - Echo - Troponin - Consider reconsult to cardiology and cardiothoracic surgery if echo concerning for worsening cardiac function or valvular vegetations - Blood culture 2  Ankle pain: Patient unsure of when her ankle was actually injured. Questionable history of falling down 10 stairs. Ankle plain film without evidence of fracture. Grade 2-3 ankle sprain with likely injury to the ATFL. - Anti-inflammatories, elevation, ice, ASO brace - PT  Flank pain/back pain: Likely  secondary to UTI/pyelonephritis. Patient does endorse macroscopic hematuria and dysuria and general malaise. UA sample contaminated in the ED but does appear infected. Will need urine culture in order to better determine if patient truly has UTI versus contaminated sample. - Antibiotics as above - IVF - Urine culture  Polysubstance abuse: Patiently readily admits to using cocaine and heroin as recently as 12/15/2015. Ongoing problem for patient. - Ativan when necessary agitation - Low-dose Oxycodone when necessary for withdrawal  Social: Due to patient's life decisions she is at high risk for dying in the very near future. Currently patient is not sober enough to have a reasonable frank discussion with her.  - Palliative care consult for goals of care discussion and future CODE STATUS  Hyponatremia: Likely secondary to poor nutritional intake, alcohol use, and overall volume depletion. This is been a recurring problem for patient when she comes to the hospital. She responds well to IVF - NS IVF - BMET Q12   Protein calorie malnutrition: patient cachectic appearing. Likely was very low oral intake due to cyclical use of alcohol and narcotics. - Pre-albumin - Nutrition consult  GERD: - continue PPI  DVT prophylaxis: Hep  Code Status: full  Family Communication: none  Disposition Plan: pending improvement  Consults called: ID  Admission status: inpt    MERRELL, DAVID J MD [facility]  If 7PM-7AM, please contact night-coverage [email or web address] Password [email or web address]  12/16/2015, 9:15 AM

## 2015-12-16 NOTE — ED Triage Notes (Signed)
Patient c/o left ankle pain states she twisted it today. C/o back pain . states she fell last week. Recently signed herself out ama from hospital. Hx. Of herion use

## 2015-12-16 NOTE — Progress Notes (Signed)
CSW received phone call from CSW ChiropodistAssistant Director and Departmental Medical Director/Physician Advisor stating that we will not be pursuing SNF placement for this Patient at Patient discharge. Patient was recently discharged on 12/02/15 [facility] at which time she left AMA within four hours of her arrival after refusing therapies. Patient is chronic IV drug abuser and endorses using cocaine and heroin less than 24 hours ago.  This CSW to staff with unit CSW upon Patient's arrival to unit.          Lance MussAshley Gardner,MSW, LCSW [facility]/67M Clinical Social Worker [phone]

## 2015-12-17 DIAGNOSIS — A4101 Sepsis due to Methicillin susceptible Staphylococcus aureus: Secondary | ICD-10-CM

## 2015-12-17 DIAGNOSIS — Z9114 Patient's other noncompliance with medication regimen: Secondary | ICD-10-CM

## 2015-12-17 DIAGNOSIS — E876 Hypokalemia: Secondary | ICD-10-CM

## 2015-12-17 LAB — BASIC METABOLIC PANEL
ANION GAP: 8 (ref 5–15)
CHLORIDE: 109 mmol/L (ref 101–111)
CO2: 20 mmol/L — ABNORMAL LOW (ref 22–32)
Calcium: 7.3 mg/dL — ABNORMAL LOW (ref 8.9–10.3)
Creatinine, Ser: 0.73 mg/dL (ref 0.44–1.00)
GFR calc non Af Amer: >60 mL/min (ref >60)
Glucose, Bld: 117 mg/dL — ABNORMAL HIGH (ref 65–99)
POTASSIUM: 2.4 mmol/L — AB (ref 3.5–5.1)
SODIUM: 137 mmol/L (ref 135–145)

## 2015-12-17 LAB — BLOOD CULTURE ID PANEL (REFLEXED)
Acinetobacter baumannii: NOT DETECTED
CANDIDA ALBICANS: NOT DETECTED
CANDIDA GLABRATA: NOT DETECTED
CANDIDA PARAPSILOSIS: NOT DETECTED
CANDIDA TROPICALIS: NOT DETECTED
Candida krusei: NOT DETECTED
ENTEROBACTER CLOACAE COMPLEX: NOT DETECTED
ENTEROBACTERIACEAE SPECIES: NOT DETECTED
Enterococcus species: DETECTED — AB
Escherichia coli: NOT DETECTED
Haemophilus influenzae: NOT DETECTED
KLEBSIELLA OXYTOCA: NOT DETECTED
KLEBSIELLA PNEUMONIAE: NOT DETECTED
LISTERIA MONOCYTOGENES: NOT DETECTED
NEISSERIA MENINGITIDIS: NOT DETECTED
PROTEUS SPECIES: NOT DETECTED
Pseudomonas aeruginosa: NOT DETECTED
STREPTOCOCCUS PYOGENES: NOT DETECTED
Serratia marcescens: NOT DETECTED
Staphylococcus aureus (BCID): NOT DETECTED
Staphylococcus species: NOT DETECTED
Streptococcus agalactiae: NOT DETECTED
Streptococcus pneumoniae: NOT DETECTED
Streptococcus species: NOT DETECTED
Vancomycin resistance: NOT DETECTED

## 2015-12-17 LAB — URINE CULTURE

## 2015-12-17 LAB — HEPATITIS PANEL, ACUTE
HCV Ab: 0.5 s/co ratio (ref 0.0–0.9)
HEP A IGM: NEGATIVE
HEP B C IGM: NEGATIVE
HEP B S AG: NEGATIVE

## 2015-12-17 LAB — HIV ANTIBODY (ROUTINE TESTING W REFLEX): HIV SCREEN 4TH GENERATION: NONREACTIVE

## 2015-12-17 LAB — MAGNESIUM: MAGNESIUM: 1.7 mg/dL (ref 1.7–2.4)

## 2015-12-17 LAB — RPR: RPR: NONREACTIVE

## 2015-12-17 MED ORDER — SODIUM CHLORIDE 0.9% FLUSH
10.0000 mL | INTRAVENOUS | Status: DC | PRN
  Administered 2015-12-18 – 2015-12-19 (×2): 10 mL
  Filled 2015-12-17 (×2): qty 40

## 2015-12-17 MED ORDER — POTASSIUM CHLORIDE 10 MEQ/100ML IV SOLN
10.0000 meq | Freq: Once | INTRAVENOUS | Status: AC
  Administered 2015-12-17: 10 meq via INTRAVENOUS
  Filled 2015-12-17: qty 100

## 2015-12-17 MED ORDER — ENSURE ENLIVE PO LIQD
237.0000 mL | Freq: Three times a day (TID) | ORAL | Status: DC
  Administered 2015-12-18 – 2015-12-19 (×2): 237 mL via ORAL

## 2015-12-17 MED ORDER — POTASSIUM CHLORIDE 10 MEQ/100ML IV SOLN: 10.0000 meq | INTRAVENOUS | Status: DC

## 2015-12-17 MED ORDER — SODIUM CHLORIDE 0.9 % IV SOLN
INTRAVENOUS | Status: DC
  Administered 2015-12-17 – 2015-12-18 (×3): via INTRAVENOUS
  Filled 2015-12-17 (×5): qty 1000

## 2015-12-17 MED ORDER — POTASSIUM CHLORIDE 10 MEQ/100ML IV SOLN
10.0000 meq | INTRAVENOUS | Status: AC
  Administered 2015-12-17 (×3): 10 meq via INTRAVENOUS
  Filled 2015-12-17 (×3): qty 100

## 2015-12-17 NOTE — Progress Notes (Signed)
  PHARMACY - PHYSICIAN COMMUNICATION CRITICAL VALUE ALERT - BLOOD CULTURE IDENTIFICATION (BCID)  Results for orders placed or performed during the hospital encounter of 12/16/15  Blood Culture ID Panel (Reflexed) (Collected: 12/16/2015  5:40 AM)  Result Value Ref Range   Enterococcus species DETECTED (A) NOT DETECTED   Vancomycin resistance NOT DETECTED NOT DETECTED   Listeria monocytogenes NOT DETECTED NOT DETECTED   Staphylococcus species NOT DETECTED NOT DETECTED   Staphylococcus aureus NOT DETECTED NOT DETECTED   Streptococcus species NOT DETECTED NOT DETECTED   Streptococcus agalactiae NOT DETECTED NOT DETECTED   Streptococcus pneumoniae NOT DETECTED NOT DETECTED   Streptococcus pyogenes NOT DETECTED NOT DETECTED   Acinetobacter baumannii NOT DETECTED NOT DETECTED   Enterobacteriaceae species NOT DETECTED NOT DETECTED   Enterobacter cloacae complex NOT DETECTED NOT DETECTED   Escherichia coli NOT DETECTED NOT DETECTED   Klebsiella oxytoca NOT DETECTED NOT DETECTED   Klebsiella pneumoniae NOT DETECTED NOT DETECTED   Proteus species NOT DETECTED NOT DETECTED   Serratia marcescens NOT DETECTED NOT DETECTED   Haemophilus influenzae NOT DETECTED NOT DETECTED   Neisseria meningitidis NOT DETECTED NOT DETECTED   Pseudomonas aeruginosa NOT DETECTED NOT DETECTED   Candida albicans NOT DETECTED NOT DETECTED   Candida glabrata NOT DETECTED NOT DETECTED   Candida krusei NOT DETECTED NOT DETECTED   Candida parapsilosis NOT DETECTED NOT DETECTED   Candida tropicalis NOT DETECTED NOT DETECTED    Name of physician (or Provider) Contacted: D. Tat  Changes to prescribed antibiotics required: 1/4 blood cx's are now positive with GPC in chains. BCID reported as enterococcus with no Zenaida NieceVan A/B gene detected so should be sensitive to penicillins. Patient had recent admit and was found to have TV IE caused by enterococcus and MSSA but only received about a week of IV abx before leaving SNF. Continues on  Unasyn and ceftriaxone at this time. No need to change current therapy now.  Enzo BiNathan Lucero Ide, PharmD, BCPS Clinical Pharmacist Pager [phone] 12/17/2015 8:48 AM

## 2015-12-17 NOTE — Progress Notes (Signed)
Initial Nutrition Assessment  DOCUMENTATION CODES:   Underweight, Non-severe (moderate) malnutrition in context of chronic illness  INTERVENTION:   -D/c Boost Breeze po TID, each supplement provides 250 kcal and 9 grams of protein -Ensure Enlive po TID, each supplement provides 350 kcal and 20 grams of protein -MVI daily  NUTRITION DIAGNOSIS:   Malnutrition related to social / environmental circumstances as evidenced by moderate depletion of body fat, moderate depletions of muscle mass.  GOAL:   Patient will meet greater than or equal to 90% of their needs  MONITOR:   Supplement acceptance, PO intake, Labs, Weight trends, Skin, I & O's  REASON FOR ASSESSMENT:   Malnutrition Screening Tool, Consult Assessment of nutrition requirement/status  ASSESSMENT:   Catherine Hernandez is a 30 y.o. female with medical history significant of ongoing heroin and cocaine use, tricuspid valve vegetations, enterococcal bacteremia. Of note patient discharged on 12/02/2015 after prolonged hospitalization secondary to sepsis from bacteremia with metastatic infection, tricuspid valve endocarditis, septic pulmonary emboli, AK I, hyponatremia, thrombocytopenia, and ongoing drug use/withdrawal.  Pt admitted with acute encephalopathy.   Pt with hx of polysubstance dependence and bacteremia. Tox screen positive for opiates and cocaine. Pt recently discharge to SNF on 12/02/15 with letter of guarantee, however, pt left facility within 4 hours of arrival. PICC line removed.   Pt somnolent at time of visit. Pt was wrapped in covers with [place] pop sticking out of blankets. RD unable to arouse pt to obtain hx or perform exam at this time.   Noted wt has been stable since last admission. Nutrition-focused physical exam completed on 11/18/15; findings were moderate fat and muscle depletion and no edema. RD suspects no changes to exam at this time.   Per doc flowsheets, meal completion 50%. Per MAR, pt has been  refusing Boost Breeze supplements. RD will order Ensure for increased nutrient provision.  Palliative care team following for goals of care.   Labs reviewed: K: 2.9 (on IV supplementation).   Diet Order:  Diet Heart Room service appropriate? Yes; Fluid consistency: Thin Diet regular Room service appropriate? Yes; Fluid consistency: Thin  Skin:  Reviewed, no issues  Last BM:  12/15/15  Height:   Ht Readings from Last 1 Encounters:  12/16/15 5\' 4"  (1.626 m)    Weight:   Wt Readings from Last 1 Encounters:  12/16/15 95 lb (43.1 kg)    Ideal Body Weight:  54.5 kg  BMI:  Body mass index is 16.31 kg/m.  Estimated Nutritional Needs:   Kcal:  1500-1700  Protein:  75-85 grams  Fluid:  1.5-1.7 L  EDUCATION NEEDS:   Education needs addressed  Kiyon Fidalgo A. Mayford KnifeWilliams, RD, LDN, CDE Pager: [phone] After hours Pager: [phone]

## 2015-12-17 NOTE — Progress Notes (Signed)
CRITICAL VALUE ALERT  Critical value received:  K=2.4  Date of notification:  12/17/2015  Time of notification:  1035  Critical value read back:Yes.    Nurse who received alert:  Lurena NidaGreta Deaunte Dente  MD notified (1st page):  Dr. Arbutus Leasat  Time of first page:  1036  MD notified (2nd page):  Time of second page:  Responding MD:  Dr. Arbutus Leasat  Time MD responded:  1040

## 2015-12-17 NOTE — Progress Notes (Signed)
PT Cancellation Note  Patient Details Name: Catherine Hernandez MRN: [id number] DOB: April 08, 1985   Cancelled Treatment:    Reason Eval/Treat Not Completed: Patient declined, no reason specified.  Woke pt up mid afternoon for therapy.  Pt got agitated, stated the only thing she wanted was food and not therapy.  Unable to get back at end of the day.  Will see as able 9/16. 12/17/2015  Carlisle BingKen Jeyda Siebel, PT [phone]  (pager)   Brayla Pat, Eliseo GumKenneth V 12/17/2015, 5:35 PM

## 2015-12-17 NOTE — Progress Notes (Signed)
INFECTIOUS DISEASE PROGRESS NOTE  ID: Catherine Hernandez is a 30 y.o. female with  Active Problems:   Sepsis (HCC)   Polysubstance dependence including opioid type drug with complication, continuous use (HCC)   Hyponatremia   Enterococcal bacteremia   Staphylococcus aureus bacteremia   Protein-calorie malnutrition (HCC)   Endocarditis of native valve   Altered mental status   Ankle sprain   Tricuspid valve vegetation   Hypokalemia   Noncompliance with medication regimen  Subjective: Without complaints. On commode having loose BM, first episode per RN.   Abtx:  Anti-infectives    Start     Dose/Rate Route Frequency Ordered Stop   12/16/15 1000  Ampicillin-Sulbactam (UNASYN) 3 g in sodium chloride 0.9 % 100 mL IVPB     3 g 100 mL/hr over 60 Minutes Intravenous Every 6 hours 12/16/15 0906     12/16/15 1000  cefTRIAXone (ROCEPHIN) 2 g in dextrose 5 % 50 mL IVPB     2 g 100 mL/hr over 30 Minutes Intravenous Every 12 hours 12/16/15 0906        Medications:  Scheduled: . ampicillin-sulbactam (UNASYN) IVPB 3 g  3 g Intravenous Q6H  . cefTRIAXone (ROCEPHIN) IVPB 2 gram/50 mL D5W (Pyxis)  2 g Intravenous Q12H  . feeding supplement (ENSURE ENLIVE)  237 mL Oral TID BM  . heparin  5,000 Units Subcutaneous Q8H  . pantoprazole  40 mg Oral Q0600  . sodium chloride flush  3 mL Intravenous Q12H    Objective: Vital signs in last 24 hours: Temp:  [97.6 F (36.4 C)-101.4 F (38.6 C)] 97.6 F (36.4 C) (09/15 1502) Pulse Rate:  [100-121] 100 (09/15 1502) Resp:  [15-18] 18 (09/15 1502) BP: (100-136)/(63-93) 125/93 (09/15 1502) SpO2:  [98 %-100 %] 100 % (09/15 1502)   General appearance: alert, cooperative and no distress Resp: clear to auscultation bilaterally Cardio: regular rate and rhythm GI: normal findings: bowel sounds normal and soft, non-tender  Lab Results  Recent Labs  12/16/15 0546  12/16/15 0549 12/16/15 1621 12/17/15 0911  WBC  --   --  12.3*  --   --   HGB  7.8*  --  8.7*  --   --   HCT 23.0*  --  26.2*  --   --   NA 129*  --  127* 137 137  K 3.0*  --  3.0* 2.9* 2.4*  CL 94*  --  95* 108 109  CO2  --   < > 22 23 20*  BUN 12  --  12 5* <5*  CREATININE 0.90  --  1.00 0.84 0.73  < > = values in this interval not displayed. Liver Panel  Recent Labs  12/16/15 0549  PROT 7.3  ALBUMIN 2.6*  AST 20  ALT 13*  ALKPHOS 103  BILITOT 0.7   Sedimentation Rate No results for input(s): ESRSEDRATE in the last 72 hours. C-Reactive Protein No results for input(s): CRP in the last 72 hours.  Microbiology: Recent Results (from the past 240 hour(s))  Culture, blood (routine x 2)     Status: None (Preliminary result)   Collection Time: 12/16/15  5:40 AM  Result Value Ref Range Status   Specimen Description BLOOD LEFT ARM  Final   Special Requests IN PEDIATRIC BOTTLE  Final   Culture  Setup Time   Final    GRAM POSITIVE COCCI IN CHAINS IN PEDIATRIC BOTTLE CRITICAL RESULT CALLED TO, READ BACK BY AND VERIFIED WITH: N BATCHELDER,PHARMD  AT 0840 12/17/15 BY L BENFIELD    Culture GRAM POSITIVE COCCI  Final   Report Status PENDING  Incomplete  Blood Culture ID Panel (Reflexed)     Status: Abnormal   Collection Time: 12/16/15  5:40 AM  Result Value Ref Range Status   Enterococcus species DETECTED (A) NOT DETECTED Final    Comment: CRITICAL RESULT CALLED TO, READ BACK BY AND VERIFIED WITH: N BATCHELDER,PHARMD AT 0840 12/17/15 BY L BENFIELD    Vancomycin resistance NOT DETECTED NOT DETECTED Final   Listeria monocytogenes NOT DETECTED NOT DETECTED Final   Staphylococcus species NOT DETECTED NOT DETECTED Final   Staphylococcus aureus NOT DETECTED NOT DETECTED Final   Streptococcus species NOT DETECTED NOT DETECTED Final   Streptococcus agalactiae NOT DETECTED NOT DETECTED Final   Streptococcus pneumoniae NOT DETECTED NOT DETECTED Final   Streptococcus pyogenes NOT DETECTED NOT DETECTED Final   Acinetobacter baumannii NOT DETECTED NOT DETECTED Final    Enterobacteriaceae species NOT DETECTED NOT DETECTED Final   Enterobacter cloacae complex NOT DETECTED NOT DETECTED Final   Escherichia coli NOT DETECTED NOT DETECTED Final   Klebsiella oxytoca NOT DETECTED NOT DETECTED Final   Klebsiella pneumoniae NOT DETECTED NOT DETECTED Final   Proteus species NOT DETECTED NOT DETECTED Final   Serratia marcescens NOT DETECTED NOT DETECTED Final   Haemophilus influenzae NOT DETECTED NOT DETECTED Final   Neisseria meningitidis NOT DETECTED NOT DETECTED Final   Pseudomonas aeruginosa NOT DETECTED NOT DETECTED Final   Candida albicans NOT DETECTED NOT DETECTED Final   Candida glabrata NOT DETECTED NOT DETECTED Final   Candida krusei NOT DETECTED NOT DETECTED Final   Candida parapsilosis NOT DETECTED NOT DETECTED Final   Candida tropicalis NOT DETECTED NOT DETECTED Final  Urine culture     Status: Abnormal   Collection Time: 12/16/15  5:50 AM  Result Value Ref Range Status   Specimen Description URINE, RANDOM  Final   Special Requests NONE  Final   Culture MULTIPLE SPECIES PRESENT, SUGGEST RECOLLECTION (A)  Final   Report Status 12/17/2015 FINAL  Final  Culture, blood (routine x 2)     Status: None (Preliminary result)   Collection Time: 12/16/15  6:12 AM  Result Value Ref Range Status   Specimen Description BLOOD LEFT ANTECUBITAL  Final   Special Requests BOTTLES DRAWN AEROBIC ONLY 5CC  Final   Culture NO GROWTH 1 DAY  Final   Report Status PENDING  Incomplete  Culture, blood (routine x 2)     Status: None (Preliminary result)   Collection Time: 12/16/15  1:50 PM  Result Value Ref Range Status   Specimen Description BLOOD RIGHT FOOT  Final   Special Requests BOTTLES DRAWN AEROBIC AND ANAEROBIC 5CC  Final   Culture NO GROWTH < 24 HOURS  Final   Report Status PENDING  Incomplete  MRSA PCR Screening     Status: None   Collection Time: 12/16/15  1:59 PM  Result Value Ref Range Status   MRSA by PCR NEGATIVE NEGATIVE Final    Comment:          The GeneXpert MRSA Assay (FDA approved for NASAL specimens only), is one component of a comprehensive MRSA colonization surveillance program. It is not intended to diagnose MRSA infection nor to guide or monitor treatment for MRSA infections.   Culture, blood (routine x 2)     Status: None (Preliminary result)   Collection Time: 12/16/15  4:59 PM  Result Value Ref Range Status   Specimen Description  BLOOD RIGHT FOOT  Final   Special Requests IN PEDIATRIC BOTTLE 0.5CC  Final   Culture NO GROWTH < 24 HOURS  Final   Report Status PENDING  Incomplete    Studies/Results: Dg Chest 2 View  Result Date: 12/16/2015 CLINICAL DATA:  Back pain. History of heroin use. Altered mental status. EXAM: CHEST  2 VIEW COMPARISON:  Chest radiograph 11/22/2015 FINDINGS: Shallow lung inflation.  Unchanged cardiomediastinal contours. No pneumothorax or sizable pleural effusion. Airspace opacities in the right lower lobe are unchanged. Previously described cavitary right upper lobe lesion is not clearly seen on this study. IMPRESSION: Right lower lobe consolidation, unchanged from most recent chest radiograph, decreased compared to more remote priors such as 11/19/2015. Electronically Signed   By: Deatra RobinsonKevin  Herman M.D.   On: 12/16/2015 06:51   Dg Ankle Complete Left  Result Date: 12/16/2015 CLINICAL DATA:  Twisting injury today. Fell down 10 steps last week. EXAM: LEFT ANKLE COMPLETE - 3+ VIEW COMPARISON:  None. FINDINGS: There is no evidence of fracture, dislocation, or joint effusion. There is no evidence of arthropathy or other focal bone abnormality. Soft tissues are unremarkable. IMPRESSION: Negative. Electronically Signed   By: Ellery Plunkaniel R Mitchell M.D.   On: 12/16/2015 06:48   Ct Head Wo Contrast  Result Date: 12/16/2015 CLINICAL DATA:  History of airway tubes.  Altered mental status. EXAM: CT HEAD WITHOUT CONTRAST TECHNIQUE: Contiguous axial images were obtained from the base of the skull through the vertex  without intravenous contrast. COMPARISON:  Head CT 04/10/2013 FINDINGS: Brain: No mass lesion, intraparenchymal hemorrhage or extra-axial collection. No evidence of acute cortical infarct. Brain parenchyma and CSF-containing spaces are normal for age. Vascular: No hyperdense vessel or atherosclerotic calcification. Skull: Normal visualized skull base, calvarium and extracranial soft tissues. Sinuses/Orbits: No sinus fluid levels or advanced mucosal thickening. No mastoid effusion. Normal orbits. IMPRESSION: Normal head CT. Electronically Signed   By: Deatra RobinsonKevin  Herman M.D.   On: 12/16/2015 06:57     Assessment/Plan: IVDA  Endocarditis Septic Pulmonary emboli  Hypokalemia  Total days of antibiotics: 1 ceftriaxone/unasyn  Would continue her current anbx Hopefully her fever will abate as she gets more anbx.  Watch her repeat BCx (1st is growing GPC in chains) Try to keep her in house as long as we can.           Catherine Hernandez Infectious Diseases (pager) [phone] [email or web address] 12/17/2015, 6:46 PM  LOS: 1 day

## 2015-12-17 NOTE — Progress Notes (Signed)
Palliative Medicine RN Note: Attempted visit with pt. RN reports she was awake earlier today, but she will not wake or even open her eyes to talk to me. PMT will attempt again over the weekend to meet for discussion re: need for advance care planning.  Discussed case during PMT rounds. PMT does not provide addiction counseling, so recommendation is for a referral to psychiatry or addiction specialist to address her issues with dependency.  Margret ChanceMelanie G. Lurlean Kernen, RN, BSN, [facility] 12/17/2015 11:10 AM Cell [phone] 8:00-4:00 Monday-Friday Office [phone]

## 2015-12-17 NOTE — Progress Notes (Signed)
PROGRESS NOTE  Catherine Hernandez XBJ:[id number] DOB: 08-26-1985 DOA: 12/16/2015 PCP: No PCP Per Patient  Brief History:  30 year old female with a history of polysubstance abuse (cocaine, heroin, tobacco, marijuana), asthma presented when she was brought in by a friend secondary to altered mental status and complaints of pain in her lateral abdomen and left ankle.  The patient is a poor historian secondary to her encephalopathy. Apparently, the patient was noted to be normal previously one week prior to admission when seen by her friend. The patient was recently discharged from the hospital after a stay from 11/16/2015 through 12/02/2015 when she was treated for sepsis secondary to MSSA and enterococcus faecalis bacteremia. 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 At that time, she was not deemed to be a good surgical candidate for bowel repair or replacement. She was discharged to a skilled nursing facility on August 31 with instructions to finish intravenous antibiotics on 12/17/2015. Unfortunately, the patient left AGAINST MEDICAL ADVICE on 12/02/15. and did not finish her antibiotic therapy. According to the history, the PICC line was removed prior to the patient leaving AGAINST MEDICAL ADVICE.   The patient had tricuspid valve vegetation on TTE. TEE done on 11/23/15--2 large shaggy and very mobile vegetations on both leaflets of the TV. Unfortunately, the patient went back to her home and cocaine usage, last used 24 hours prior to admission. Upon presentation, the patient was febrile and tachycardic. The patient was started back on Unasyn and ceftriaxone. Not surprisingly , the patient's blood cultures have become positive once again. ID was reconsulted.  Assessment/Plan: Sepsis -due to bacteremia with metastatic infection -patient has pulmonary infiltrates which representsmetastatic infection from bacteremia -Continue intravenous  fluids -Continue Unasyn and ceftriaxone -lactic acid 1.00  Tachypnea -12/15/2068 chest x-ray--unchanged RLL, RUL opacity -secondary to septic pulmonary emboli -98-100% on RA  MSSA & E faecalisBacteremia -12/16/2015 blood cultures--[facility] in chains -Surveillance blood cultures today -Continue Unasyn and ceftriaxone -previously due to finish Abx 12/17/15 -antibiotics per ID  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%, tricuspid valve vegetations -11/23/15 TEE--2 large shaggy and very mobile vegetations on both leaflets of the TV -previously seen by [facility]CTS--felt to be poor surgical candidate  Pulmonary infiltrates/Septic emboli -11/17/15--V/Q scan--indeterminate -8/16/17CT chest without contrast--multifocal nodular consolidations with cavitations likely septic emboli; RLL consolidation/wedge-shaped infarct  Polysubstance abuse -Including tobacco, cocaine, heroin  Protein calorie malnutrition -Consult nutrition -continue supplements  Hypokalemia -replete -check mag    Disposition Plan:   Not stable for d/c--anticipate prolonged stay with high risk of mortality with early discharge Family Communication:   No Family at bedside--Total time spent 35 minutes.  Greater than 50% spent face to face counseling and coordinating care.  Consultants:  ID  Code Status:  FULL  DVT Prophylaxis:  [place] Heparin   Procedures: As Listed in Progress Note Above  Antibiotics: None    Subjective: Patient denies fevers, chills, headache, chest pain,  nausea, vomiting, diarrhea, abdominal pain, dysuria,  hematochezia, and melena.   Objective: Vitals:   12/16/15 1343 12/16/15 2132 12/16/15 2354 12/17/15 0613  BP: 122/62 113/67 100/63 136/81  Pulse: 115 (!) 110 100 (!) 121  Resp: 18 18  15   Temp: 99 F (37.2 C) 98.4 F (36.9 C)  (!) 101.4 F (38.6 C)  TempSrc: Oral Oral  Oral  SpO2: 98% 98%  100%  Weight:      Height:  Intake/Output Summary (Last 24 hours) at  12/17/15 0906 Last data filed at 12/17/15 0552  Gross per 24 hour  Intake          2140.83 ml  Output              500 ml  Net          1640.83 ml   Weight change:  Exam:   General:  Pt is alert, follows commands appropriately, not in acute distress  HEENT: No icterus, No thrush, No neck mass, [place]/AT  Cardiovascular: RRR, S1/S2, no rubs, no gallops  Respiratory: Bibasilar rales without wheezing.   Abdomen: Soft/+BS,  left upper quadrant/left flank tender without any rebound,non distended, no guarding  Extremities: No edema, No lymphangitis, No petechiae, No rashes, no synovitis   Data Reviewed: I have personally reviewed following labs and imaging studies Basic Metabolic Panel:  Recent Labs Lab 12/16/15 0546 12/16/15 0549 12/16/15 1621  NA 129* 127* 137  K 3.0* 3.0* 2.9*  CL 94* 95* 108  CO2  --  22 23  GLUCOSE 139* 136* 97  BUN 12 12 5*  CREATININE 0.90 1.00 0.84  CALCIUM  --  8.2* 7.5*   Liver Function Tests:  Recent Labs Lab 12/16/15 0549  AST 20  ALT 13*  ALKPHOS 103  BILITOT 0.7  PROT 7.3  ALBUMIN 2.6*   No results for input(s): LIPASE, AMYLASE in the last 168 hours. No results for input(s): AMMONIA in the last 168 hours. Coagulation Profile: No results for input(s): INR, PROTIME in the last 168 hours. CBC:  Recent Labs Lab 12/16/15 0546 12/16/15 0549  WBC  --  12.3*  NEUTROABS  --  8.8*  HGB 7.8* 8.7*  HCT 23.0* 26.2*  MCV  --  80.1  PLT  --  270   Cardiac Enzymes:  Recent Labs Lab 12/16/15 1621  TROPONINI <0.03   BNP: Invalid input(s): POCBNP CBG: No results for input(s): GLUCAP in the last 168 hours. HbA1C: No results for input(s): HGBA1C in the last 72 hours. Urine analysis:    Component Value Date/Time   COLORURINE YELLOW 12/16/2015 0550   APPEARANCEUR CLEAR 12/16/2015 0550   LABSPEC 1.009 12/16/2015 0550   PHURINE 6.0 12/16/2015 0550   GLUCOSEU NEGATIVE 12/16/2015 0550   HGBUR LARGE (A) 12/16/2015 0550    BILIRUBINUR NEGATIVE 12/16/2015 0550   KETONESUR NEGATIVE 12/16/2015 0550   PROTEINUR 100 (A) 12/16/2015 0550   UROBILINOGEN 0.2 01/07/2012 1140   NITRITE NEGATIVE 12/16/2015 0550   LEUKOCYTESUR NEGATIVE 12/16/2015 0550   Sepsis Labs: @LABRCNTIP (procalcitonin:4,lacticidven:4) ) Recent Results (from the past 240 hour(s))  Culture, blood (routine x 2)     Status: None (Preliminary result)   Collection Time: 12/16/15  5:40 AM  Result Value Ref Range Status   Specimen Description BLOOD LEFT ARM  Final   Special Requests IN PEDIATRIC BOTTLE  Final   Culture  Setup Time   Final    GRAM POSITIVE COCCI IN CHAINS IN PEDIATRIC BOTTLE CRITICAL RESULT CALLED TO, READ BACK BY AND VERIFIED WITH: N BATCHELDER,PHARMD AT 0840 12/17/15 BY L BENFIELD    Culture GRAM POSITIVE COCCI  Final   Report Status PENDING  Incomplete  Blood Culture ID Panel (Reflexed)     Status: Abnormal   Collection Time: 12/16/15  5:40 AM  Result Value Ref Range Status   Enterococcus species DETECTED (A) NOT DETECTED Final    Comment: CRITICAL RESULT CALLED TO, READ BACK BY AND VERIFIED WITH: N BATCHELDER,PHARMD  AT 0840 12/17/15 BY L BENFIELD    Vancomycin resistance NOT DETECTED NOT DETECTED Final   Listeria monocytogenes NOT DETECTED NOT DETECTED Final   Staphylococcus species NOT DETECTED NOT DETECTED Final   Staphylococcus aureus NOT DETECTED NOT DETECTED Final   Streptococcus species NOT DETECTED NOT DETECTED Final   Streptococcus agalactiae NOT DETECTED NOT DETECTED Final   Streptococcus pneumoniae NOT DETECTED NOT DETECTED Final   Streptococcus pyogenes NOT DETECTED NOT DETECTED Final   Acinetobacter baumannii NOT DETECTED NOT DETECTED Final   Enterobacteriaceae species NOT DETECTED NOT DETECTED Final   Enterobacter cloacae complex NOT DETECTED NOT DETECTED Final   Escherichia coli NOT DETECTED NOT DETECTED Final   Klebsiella oxytoca NOT DETECTED NOT DETECTED Final   Klebsiella pneumoniae NOT DETECTED NOT  DETECTED Final   Proteus species NOT DETECTED NOT DETECTED Final   Serratia marcescens NOT DETECTED NOT DETECTED Final   Haemophilus influenzae NOT DETECTED NOT DETECTED Final   Neisseria meningitidis NOT DETECTED NOT DETECTED Final   Pseudomonas aeruginosa NOT DETECTED NOT DETECTED Final   Candida albicans NOT DETECTED NOT DETECTED Final   Candida glabrata NOT DETECTED NOT DETECTED Final   Candida krusei NOT DETECTED NOT DETECTED Final   Candida parapsilosis NOT DETECTED NOT DETECTED Final   Candida tropicalis NOT DETECTED NOT DETECTED Final  MRSA PCR Screening     Status: None   Collection Time: 12/16/15  1:59 PM  Result Value Ref Range Status   MRSA by PCR NEGATIVE NEGATIVE Final    Comment:        The GeneXpert MRSA Assay (FDA approved for NASAL specimens only), is one component of a comprehensive MRSA colonization surveillance program. It is not intended to diagnose MRSA infection nor to guide or monitor treatment for MRSA infections.      Scheduled Meds: . ampicillin-sulbactam (UNASYN) IVPB 3 g  3 g Intravenous Q6H  . cefTRIAXone (ROCEPHIN) IVPB 2 gram/50 mL D5W (Pyxis)  2 g Intravenous Q12H  . feeding supplement  1 Container Oral BID BM  . heparin  5,000 Units Subcutaneous Q8H  . pantoprazole  40 mg Oral Q0600  . sodium chloride flush  3 mL Intravenous Q12H   Continuous Infusions: . sodium chloride 1,000 mL (12/17/15 0620)    Procedures/Studies: Dg Chest 2 View  Result Date: 12/16/2015 CLINICAL DATA:  Back pain. History of heroin use. Altered mental status. EXAM: CHEST  2 VIEW COMPARISON:  Chest radiograph 11/22/2015 FINDINGS: Shallow lung inflation.  Unchanged cardiomediastinal contours. No pneumothorax or sizable pleural effusion. Airspace opacities in the right lower lobe are unchanged. Previously described cavitary right upper lobe lesion is not clearly seen on this study. IMPRESSION: Right lower lobe consolidation, unchanged from most recent chest radiograph,  decreased compared to more remote priors such as 11/19/2015. Electronically Signed   By: Deatra RobinsonKevin  Herman M.D.   On: 12/16/2015 06:51   Dg Ankle Complete Left  Result Date: 12/16/2015 CLINICAL DATA:  Twisting injury today. Fell down 10 steps last week. EXAM: LEFT ANKLE COMPLETE - 3+ VIEW COMPARISON:  None. FINDINGS: There is no evidence of fracture, dislocation, or joint effusion. There is no evidence of arthropathy or other focal bone abnormality. Soft tissues are unremarkable. IMPRESSION: Negative. Electronically Signed   By: Ellery Plunkaniel R Mitchell M.D.   On: 12/16/2015 06:48   Ct Head Wo Contrast  Result Date: 12/16/2015 CLINICAL DATA:  History of airway tubes.  Altered mental status. EXAM: CT HEAD WITHOUT CONTRAST TECHNIQUE: Contiguous axial images were obtained from the base  of the skull through the vertex without intravenous contrast. COMPARISON:  Head CT 04/10/2013 FINDINGS: Brain: No mass lesion, intraparenchymal hemorrhage or extra-axial collection. No evidence of acute cortical infarct. Brain parenchyma and CSF-containing spaces are normal for age. Vascular: No hyperdense vessel or atherosclerotic calcification. Skull: Normal visualized skull base, calvarium and extracranial soft tissues. Sinuses/Orbits: No sinus fluid levels or advanced mucosal thickening. No mastoid effusion. Normal orbits. IMPRESSION: Normal head CT. Electronically Signed   By: Deatra Robinson M.D.   On: 12/16/2015 06:57   Ct Chest Wo Contrast  Result Date: 11/18/2015 CLINICAL DATA:  30 year old female with history of pulmonary infiltrates. Pneumonia. History of substance abuse. EXAM: CT CHEST WITHOUT CONTRAST TECHNIQUE: Multidetector CT imaging of the chest was performed following the standard protocol without IV contrast. COMPARISON:  No prior chest CT.  Chest x-ray 11/16/2015. FINDINGS: Cardiovascular: Heart size is normal. There is no significant pericardial fluid, thickening or pericardial calcification. Mediastinum/Nodes:  Multiple prominent mediastinal lymph nodes measuring up to 1.5 cm in short axis in the subcarinal nodal station. Please note that accurate exclusion of hilar adenopathy is limited on noncontrast CT scans. Esophagus is unremarkable in appearance. No axillary lymphadenopathy. Lungs/Pleura: Multifocal airspace consolidation is noted throughout the lungs bilaterally, most confluent in the posterior aspect of the right lower lobe. Several of the areas of apparent airspace consolidation are somewhat nodular in appearance, and there is one cavitary area in the right upper lobe (image 80 of series 3) which measures up to 1.7 cm in diameter, in addition to a smaller cavitary area in the periphery of the left lower lobe (image 89 of series 3). The overall spectrum of findings is highly concerning for multifocal pneumonia, presumably from septic emboli. Given the somewhat wedge-shaped appearance of the largest area of consolidation in the right lower lobe, the possibility of significant ischemia/infarct in the right lower lobe related to a larger pulmonary embolus is considered likely (image 71 of series 3). Trace bilateral pleural effusions lie dependently. Upper Abdomen: Although incompletely visualized, the spleen appears enlarged measuring up to 13.8 x 5.1 cm on axial images. Musculoskeletal: There are no aggressive appearing lytic or blastic lesions noted in the visualized portions of the skeleton. IMPRESSION: 1. The overall spectrum of findings in the lungs is compatible with multilobar pneumonia, likely from septic embolization. 2. The largest area of airspace consolidation in the right lower lobe has a very wedge-shaped in appearanceand is concerning for potential pulmonary infarct from larger embolus. This could be further evaluated with PE protocol CT scan if clinically appropriate. 3. Trace bilateral pleural effusions lying dependently. 4. Splenomegaly. These results will be called to the ordering clinician or  representative by the Radiologist Assistant, and communication documented in the PACS or zVision Dashboard. Electronically Signed   By: Trudie Reed M.D.   On: 11/18/2015 08:28   US Renal  Result Date: 11/17/2015 CLINICAL DATA:  Acute kidney injury.  Sepsis. EXAM: RENAL / URINARY TRACT ULTRASOUND COMPLETE COMPARISON:  None. FINDINGS: Right Kidney: Length: 13.8 cm, within normal limits. The kidney is somewhat echogenic. It is isoechoic to the index organ, the liver. No discrete mass lesion or stone is present. There is no hydronephrosis. Left Kidney: Length: 13.9 cm, within normal limits. The kidney is somewhat echogenic. It is isoechoic to the index organ, the spleen. No discrete mass lesion or stone is present. There is no hydronephrosis. Bladder: Appears normal for degree of bladder distention. Incidental note is made of multiple shadowing stones within the gallbladder. There  is no significant wall thickening to suggest cholecystitis. IMPRESSION: 1. The kidneys are hyperechoic bilaterally. This is nonspecific, but can be seen in the setting of acute medical renal disease. 2. No renal calculus or mass lesion.  No hydronephrosis. 3. Cholelithiasis without evidence for cholecystitis. Electronically Signed   By: Marin Roberts M.D.   On: 11/17/2015 22:27   Nm Pulmonary Perf And Vent  Result Date: 11/17/2015 CLINICAL DATA:  Shortness of breath, hemoptysis, elevated D-dimer, history asthma EXAM: NUCLEAR MEDICINE BOWEL (MECKEL'S) SCAN TECHNIQUE: Sequential abdominal images were obtained following intravenous injection of radiopharmaceutical. RADIOPHARMACEUTICALS:  mCi Tc-54m pertechnetate IV 30.3 mCi Technetium-84m DTPA aerosol inhalation and 4.0 mCi Technetium-34m MAA IV COMPARISON:  None. None Correlation:  Chest radiograph 11/16/2015 FINDINGS: Ventilation: Diminished ventilation at apices. Focal ventilation defect at medial LEFT lower lobe. Additional diminished ventilation in both lower lobes.  Perfusion: Small subsegmental perfusion defect lateral RIGHT upper lobe corresponding to focal opacity on chest radiograph. Additional large segmental defect medial RIGHT middle lobe corresponding to infiltrate on the chest radiograph. Additional perfusion defects in BILATERAL lower lobes, though stripe signs are present at both. Chest radiograph demonstrates multifocal multifocal infiltrates bilaterally. Presence of matching ventilation, perfusion, and radiographic abnormalities represents an intermediate probability for pulmonary embolism. IMPRESSION: Matching ventilatory, perfusion and radiographic abnormalities compatible with an intermediate probability for pulmonary embolism. Electronically Signed   By: Ulyses Southward M.D.   On: 11/17/2015 12:03   Dg Chest Port 1 View  Result Date: 11/22/2015 CLINICAL DATA:  Shortness of Breath EXAM: PORTABLE CHEST 1 VIEW November 19, 2015 and chest CT November 17, 2015 FINDINGS: There has been slight partial clearing of consolidation from the right lower lobe. Patchy infiltrate in the left perihilar region remains. A somewhat larger focus of consolidation in the medial left base remains. The cavitary area in the left apex remains stable as does a somewhat ill-defined nodular opacity in the right upper lobe. No new areas of opacity are evident. Heart size and pulmonary vascular normal. No adenopathy. No bone lesions evident. IMPRESSION: Slight clearing of infiltrate from the right lower lobe. Other areas of opacity/consolidation remain stable. There remains cavitation in a lesion in the left upper lobe. No new opacities are evident. Heart size within normal limits. Electronically Signed   By: Bretta Bang III M.D.   On: 11/22/2015 17:25   Dg Chest Port 1 View  Result Date: 11/19/2015 CLINICAL DATA:  Shortness of breath and chest pain EXAM: PORTABLE CHEST 1 VIEW COMPARISON:  11/17/2015 FINDINGS: Cardiac shadow is stable. Persistent right lower lobe infiltrate is noted.  Patchy changes are noted bilaterally consistent with that seen on recent CT examination. No acute bony abnormality is noted. IMPRESSION: Patchy changes bilaterally, worst in the right lower lobe consistent with multifocal pneumonia. Electronically Signed   By: Alcide Clever M.D.   On: 11/19/2015 11:21    Thelonious Kauffmann, DO  [facility] Pager [phone]  If 7PM-7AM, please contact night-coverage [email or web address] Password [email or web address] 12/17/2015, 9:06 AM   LOS: 1 day

## 2015-12-18 ENCOUNTER — Inpatient Hospital Stay (HOSPITAL_COMMUNITY): Payer: Self-pay

## 2015-12-18 DIAGNOSIS — A408 Other streptococcal sepsis: Secondary | ICD-10-CM

## 2015-12-18 DIAGNOSIS — E46 Unspecified protein-calorie malnutrition: Secondary | ICD-10-CM

## 2015-12-18 DIAGNOSIS — R7881 Bacteremia: Secondary | ICD-10-CM

## 2015-12-18 DIAGNOSIS — E876 Hypokalemia: Secondary | ICD-10-CM

## 2015-12-18 LAB — CBC
HCT: 18 % — ABNORMAL LOW (ref 36.0–46.0)
HEMOGLOBIN: 5.7 g/dL — AB (ref 12.0–15.0)
MCH: 26.3 pg (ref 26.0–34.0)
MCHC: 31.7 g/dL (ref 30.0–36.0)
MCV: 82.9 fL (ref 78.0–100.0)
Platelets: 204 10*3/uL (ref 150–400)
RBC: 2.17 MIL/uL — ABNORMAL LOW (ref 3.87–5.11)
RDW: 17.1 % — AB (ref 11.5–15.5)
WBC: 5.6 10*3/uL (ref 4.0–10.5)

## 2015-12-18 LAB — BASIC METABOLIC PANEL
ANION GAP: 7 (ref 5–15)
BUN: <5 mg/dL — ABNORMAL LOW (ref 6–20)
CALCIUM: 7.3 mg/dL — AB (ref 8.9–10.3)
CO2: 21 mmol/L — ABNORMAL LOW (ref 22–32)
Chloride: 116 mmol/L — ABNORMAL HIGH (ref 101–111)
Creatinine, Ser: 0.75 mg/dL (ref 0.44–1.00)
GFR calc Af Amer: >60 mL/min (ref >60)
GLUCOSE: 133 mg/dL — AB (ref 65–99)
Potassium: 3 mmol/L — ABNORMAL LOW (ref 3.5–5.1)
SODIUM: 144 mmol/L (ref 135–145)

## 2015-12-18 LAB — ECHOCARDIOGRAM COMPLETE
Height: 64 in
WEIGHTICAEL: 1520 [oz_av]

## 2015-12-18 LAB — PREPARE RBC (CROSSMATCH)

## 2015-12-18 LAB — MAGNESIUM: MAGNESIUM: 1.5 mg/dL — AB (ref 1.7–2.4)

## 2015-12-18 MED ORDER — MAGNESIUM SULFATE 2 GM/50ML IV SOLN
2.0000 g | Freq: Once | INTRAVENOUS | Status: AC
Start: 2015-12-18 — End: 2015-12-18
  Administered 2015-12-18: 2 g via INTRAVENOUS
  Filled 2015-12-18: qty 50

## 2015-12-18 MED ORDER — POTASSIUM CHLORIDE IN NACL 40-0.9 MEQ/L-% IV SOLN
INTRAVENOUS | Status: DC
  Administered 2015-12-18 – 2015-12-19 (×2): 125 mL/h via INTRAVENOUS
  Filled 2015-12-18 (×4): qty 1000

## 2015-12-18 MED ORDER — POTASSIUM CHLORIDE CRYS ER 20 MEQ PO TBCR
40.0000 meq | EXTENDED_RELEASE_TABLET | Freq: Once | ORAL | Status: AC
  Administered 2015-12-18: 40 meq via ORAL
  Filled 2015-12-18: qty 2

## 2015-12-18 MED ORDER — SODIUM CHLORIDE 0.9 % IV SOLN
Freq: Once | INTRAVENOUS | Status: AC
  Administered 2015-12-18: 13:00:00 via INTRAVENOUS

## 2015-12-18 NOTE — Progress Notes (Signed)
PT Cancellation Note  Patient Details Name: Catherine Hernandez MRN: [id number] DOB: [birth year]   Cancelled Treatment:    Reason Eval/Treat Not Completed: Medical issues which prohibited therapy.  Hgb 5.7 and other low labs, and will check tomorrow to see if pt is able to tolerate.   Ivar DrapeStout, [name] E 12/18/2015, 12:36 PM    Samul Dadauth [name], PT MS Acute Rehab Dept. Number: [facility] [phone] and [facility] [phone]

## 2015-12-18 NOTE — Progress Notes (Signed)
Patient refuses to have bed alarm on and does not want to comply with the safety of the bed alarm. Patient educated about notifying nurse via call bell and reeducated of safe practices. Bed alarm off.

## 2015-12-18 NOTE — Progress Notes (Signed)
PROGRESS NOTE  Catherine Hernandez VQQ:[id number] DOB: 03/24/1986 DOA: 12/16/2015 PCP: No PCP Per Patient   Brief History:  30 year old female with a history of polysubstance abuse (cocaine, heroin, tobacco, marijuana), asthma presented when she was brought in by a friend secondary to altered mental status and complaints of pain in her lateral abdomen and left ankle.  The patient is a poor historian secondary to her encephalopathy. Apparently, the patient was noted to be normal previously one week prior to admission when seen by her friend. The patient was recently discharged from the hospital after a stay from 11/16/2015 through 12/02/2015 when she was treated for sepsis secondary to MSSA and enterococcus faecalis bacteremia. 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 At that time, she was not deemed to be a good surgical candidate for bowel repair or replacement. She was discharged to a skilled nursing facility on August 31 with instructions to finish intravenous antibiotics on 12/17/2015. Unfortunately, the patient left AGAINST MEDICAL ADVICE on 12/02/15. and did not finish her antibiotic therapy. According to the history, the PICC line was removed prior to the patient leaving AGAINST MEDICAL ADVICE.   The patient had tricuspid valve vegetation on TTE. TEE done on 11/23/15--2 large shaggy and very mobile vegetations on both leaflets of the TV. Unfortunately, the patient went back to her home and cocaine usage, last used 24 hours prior to admission. Upon presentation, the patient was febrile and tachycardic. The patient was started back on Unasyn and ceftriaxone. Not surprisingly , the patient's blood cultures have become positive once again. ID was reconsulted.  Assessment/Plan: Sepsis -due to bacteremia with metastatic infection -patient has pulmonary infiltrates which representsmetastatic infection from bacteremia -Continue  intravenous fluids -Continue Unasyn and ceftriaxone -lactic acid 1.00  Tachypnea -12/15/2068 chest x-ray--unchanged RLL, RUL opacity -secondary to septic pulmonary emboli -98-100% on RA  MSSA & E faecalisBacteremia -12/16/2015 blood cultures--Efaecalis -Surveillance blood cultures today -Continue Unasyn and ceftriaxone--9/14 start date -previously due to finish Abx 12/17/15 -antibiotics per ID  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%, tricuspid valve vegetations -11/23/15 TEE--2 large shaggy and very mobile vegetations on both leaflets of the TV -previously seen by TCTS--felt to be poor surgical candidate -12/18/15 Echo--decreased burden of vegetation with mod-severe TR  Pulmonary infiltrates/Septic emboli -11/17/15--V/Q scan--indeterminate -8/16/17CT chest without contrast--multifocal nodular consolidations with cavitations likely septic emboli; RLL consolidation/wedge-shaped infarct  Polysubstance abuse -Including tobacco, cocaine, heroin  Protein calorie malnutrition -Consult nutrition -continue supplements  Hypokalemia/Hypomagnesemia -replete -check mag--1.5-->replete    Disposition Plan:   Not stable for d/c--anticipate prolonged stay with high risk of mortality with early discharge Family Communication:   No Family at bedside Consultants:  ID  Code Status:  FULL  DVT Prophylaxis:  Benjamin Heparin   Procedures: As Listed in Progress Note Above  Antibiotics: Unasyn 12/16/15>>> Ceftriaxone 12/16/15>>>   Subjective:   Objective: Vitals:   12/18/15 1350 12/18/15 1615 12/18/15 1635 12/18/15 1656  BP: (!) 139/97 (!) 143/92 (!) 142/96 137/88  Pulse: 83 73 75 75  Resp: 18 18 18 18   Temp: 98.1 F (36.7 C) 99.1 F (37.3 C) 98.6 F (37 C) 99.5 F (37.5 C)  TempSrc:      SpO2: 100% 100% 100% 100%  Weight:      Height:        Intake/Output Summary (Last 24 hours) at 12/18/15 1908 Last data filed at 12/18/15 1641  Gross per 24  hour  Intake           1572.5 ml  Output                0 ml  Net           1572.5 ml   Weight change:  Exam:   General:  Pt is alert, follows commands appropriately, not in acute distress  HEENT: No icterus, No thrush, No neck mass, [place]/AT  Cardiovascular: RRR, S1/S2, no rubs, no gallops  Respiratory: CTA bilaterally, no wheezing, no crackles, no rhonchi  Abdomen: Soft/+BS, non tender, non distended, no guarding  Extremities: No edema, No lymphangitis, No petechiae, No rashes, no synovitis   Data Reviewed: I have personally reviewed following labs and imaging studies Basic Metabolic Panel:  Recent Labs Lab 12/16/15 0546 12/16/15 0549 12/16/15 1621 12/17/15 0911 12/18/15 0554  NA 129* 127* 137 137 144  K 3.0* 3.0* 2.9* 2.4* 3.0*  CL 94* 95* 108 109 116*  CO2  --  22 23 20* 21*  GLUCOSE 139* 136* 97 117* 133*  BUN 12 12 5* <5* <5*  CREATININE 0.90 1.00 0.84 0.73 0.75  CALCIUM  --  8.2* 7.5* 7.3* 7.3*  MG  --   --   --  1.7 1.5*   Liver Function Tests:  Recent Labs Lab 12/16/15 0549  AST 20  ALT 13*  ALKPHOS 103  BILITOT 0.7  PROT 7.3  ALBUMIN 2.6*   No results for input(s): LIPASE, AMYLASE in the last 168 hours. No results for input(s): AMMONIA in the last 168 hours. Coagulation Profile: No results for input(s): INR, PROTIME in the last 168 hours. CBC:  Recent Labs Lab 12/16/15 0546 12/16/15 0549 12/18/15 0554  WBC  --  12.3* 5.6  NEUTROABS  --  8.8*  --   HGB 7.8* 8.7* 5.7*  HCT 23.0* 26.2* 18.0*  MCV  --  80.1 82.9  PLT  --  270 204   Cardiac Enzymes:  Recent Labs Lab 12/16/15 1621  TROPONINI <0.03   BNP: Invalid input(s): POCBNP CBG: No results for input(s): GLUCAP in the last 168 hours. HbA1C: No results for input(s): HGBA1C in the last 72 hours. Urine analysis:    Component Value Date/Time   COLORURINE YELLOW 12/16/2015 0550   APPEARANCEUR CLEAR 12/16/2015 0550   LABSPEC 1.009 12/16/2015 0550   PHURINE 6.0 12/16/2015 0550     GLUCOSEU NEGATIVE 12/16/2015 0550   HGBUR LARGE (A) 12/16/2015 0550   BILIRUBINUR NEGATIVE 12/16/2015 0550   KETONESUR NEGATIVE 12/16/2015 0550   PROTEINUR 100 (A) 12/16/2015 0550   UROBILINOGEN 0.2 01/07/2012 1140   NITRITE NEGATIVE 12/16/2015 0550   LEUKOCYTESUR NEGATIVE 12/16/2015 0550   Sepsis Labs: @LABRCNTIP (procalcitonin:4,lacticidven:4) ) Recent Results (from the past 240 hour(s))  Culture, blood (routine x 2)     Status: Abnormal (Preliminary result)   Collection Time: 12/16/15  5:40 AM  Result Value Ref Range Status   Specimen Description BLOOD LEFT ARM  Final   Special Requests IN PEDIATRIC BOTTLE  Final   Culture  Setup Time   Final    GRAM POSITIVE COCCI IN CHAINS IN PEDIATRIC BOTTLE CRITICAL RESULT CALLED TO, READ BACK BY AND VERIFIED WITH: N BATCHELDER,PHARMD AT 0840 12/17/15 BY L BENFIELD    Culture (A)  Final    ENTEROCOCCUS FAECALIS SUSCEPTIBILITIES TO FOLLOW    Report Status PENDING  Incomplete  Blood Culture ID Panel (Reflexed)     Status: Abnormal   Collection Time: 12/16/15  5:40 AM  Result Value Ref Range Status   Enterococcus species DETECTED (A) NOT DETECTED Final    Comment: CRITICAL RESULT CALLED TO, READ BACK BY AND VERIFIED WITH: N BATCHELDER,PHARMD AT 0840 12/17/15 BY L BENFIELD    Vancomycin resistance NOT DETECTED NOT DETECTED Final   Listeria monocytogenes NOT DETECTED NOT DETECTED Final   Staphylococcus species NOT DETECTED NOT DETECTED Final   Staphylococcus aureus NOT DETECTED NOT DETECTED Final   Streptococcus species NOT DETECTED NOT DETECTED Final   Streptococcus agalactiae NOT DETECTED NOT DETECTED Final   Streptococcus pneumoniae NOT DETECTED NOT DETECTED Final   Streptococcus pyogenes NOT DETECTED NOT DETECTED Final   Acinetobacter baumannii NOT DETECTED NOT DETECTED Final   Enterobacteriaceae species NOT DETECTED NOT DETECTED Final   Enterobacter cloacae complex NOT DETECTED NOT DETECTED Final   Escherichia coli NOT  DETECTED NOT DETECTED Final   Klebsiella oxytoca NOT DETECTED NOT DETECTED Final   Klebsiella pneumoniae NOT DETECTED NOT DETECTED Final   Proteus species NOT DETECTED NOT DETECTED Final   Serratia marcescens NOT DETECTED NOT DETECTED Final   Haemophilus influenzae NOT DETECTED NOT DETECTED Final   Neisseria meningitidis NOT DETECTED NOT DETECTED Final   Pseudomonas aeruginosa NOT DETECTED NOT DETECTED Final   Candida albicans NOT DETECTED NOT DETECTED Final   Candida glabrata NOT DETECTED NOT DETECTED Final   Candida krusei NOT DETECTED NOT DETECTED Final   Candida parapsilosis NOT DETECTED NOT DETECTED Final   Candida tropicalis NOT DETECTED NOT DETECTED Final  Urine culture     Status: Abnormal   Collection Time: 12/16/15  5:50 AM  Result Value Ref Range Status   Specimen Description URINE, RANDOM  Final   Special Requests NONE  Final   Culture MULTIPLE SPECIES PRESENT, SUGGEST RECOLLECTION (A)  Final   Report Status 12/17/2015 FINAL  Final  Culture, blood (routine x 2)     Status: None (Preliminary result)   Collection Time: 12/16/15  6:12 AM  Result Value Ref Range Status   Specimen Description BLOOD LEFT ANTECUBITAL  Final   Special Requests BOTTLES DRAWN AEROBIC ONLY 5CC  Final   Culture NO GROWTH 2 DAYS  Final   Report Status PENDING  Incomplete  Culture, blood (routine x 2)     Status: None (Preliminary result)   Collection Time: 12/16/15  1:50 PM  Result Value Ref Range Status   Specimen Description BLOOD RIGHT FOOT  Final   Special Requests BOTTLES DRAWN AEROBIC AND ANAEROBIC 5CC  Final   Culture NO GROWTH 2 DAYS  Final   Report Status PENDING  Incomplete  MRSA PCR Screening     Status: None   Collection Time: 12/16/15  1:59 PM  Result Value Ref Range Status   MRSA by PCR NEGATIVE NEGATIVE Final    Comment:        The GeneXpert MRSA Assay (FDA approved for NASAL specimens only), is one component of a comprehensive MRSA colonization surveillance program. It is  not intended to diagnose MRSA infection nor to guide or monitor treatment for MRSA infections.   Culture, blood (routine x 2)     Status: None (Preliminary result)   Collection Time: 12/16/15  4:59 PM  Result Value Ref Range Status   Specimen Description BLOOD RIGHT FOOT  Final   Special Requests IN PEDIATRIC BOTTLE 0.5CC  Final   Culture NO GROWTH 2 DAYS  Final   Report Status PENDING  Incomplete  Culture, blood (Routine X 2) w Reflex to ID Panel  Status: None (Preliminary result)   Collection Time: 12/17/15  3:10 PM  Result Value Ref Range Status   Specimen Description BLOOD LEFT ANTECUBITAL  Final   Special Requests IN PEDIATRIC BOTTLE 5CC  Final   Culture NO GROWTH < 24 HOURS  Final   Report Status PENDING  Incomplete  Culture, blood (Routine X 2) w Reflex to ID Panel     Status: None (Preliminary result)   Collection Time: 12/17/15  3:15 PM  Result Value Ref Range Status   Specimen Description BLOOD LEFT HAND  Final   Special Requests IN PEDIATRIC BOTTLE 4CC  Final   Culture NO GROWTH < 24 HOURS  Final   Report Status PENDING  Incomplete     Scheduled Meds: . ampicillin-sulbactam (UNASYN) IVPB 3 g  3 g Intravenous Q6H  . cefTRIAXone (ROCEPHIN) IVPB 2 gram/50 mL D5W (Pyxis)  2 g Intravenous Q12H  . feeding supplement (ENSURE ENLIVE)  237 mL Oral TID BM  . heparin  5,000 Units Subcutaneous Q8H  . magnesium sulfate 1 - 4 g bolus IVPB  2 g Intravenous Once  . pantoprazole  40 mg Oral Q0600  . potassium chloride  40 mEq Oral Once  . sodium chloride flush  3 mL Intravenous Q12H   Continuous Infusions: . 0.9 % NaCl with KCl 40 mEq / L 125 mL/hr (12/18/15 0403)    Procedures/Studies: Dg Chest 2 View  Result Date: 12/16/2015 CLINICAL DATA:  Back pain. History of heroin use. Altered mental status. EXAM: CHEST  2 VIEW COMPARISON:  Chest radiograph 11/22/2015 FINDINGS: Shallow lung inflation.  Unchanged cardiomediastinal contours. No pneumothorax or sizable pleural effusion.  Airspace opacities in the right lower lobe are unchanged. Previously described cavitary right upper lobe lesion is not clearly seen on this study. IMPRESSION: Right lower lobe consolidation, unchanged from most recent chest radiograph, decreased compared to more remote priors such as 11/19/2015. Electronically Signed   By: Deatra RobinsonKevin  Herman M.D.   On: 12/16/2015 06:51   Dg Ankle Complete Left  Result Date: 12/16/2015 CLINICAL DATA:  Twisting injury today. Fell down 10 steps last week. EXAM: LEFT ANKLE COMPLETE - 3+ VIEW COMPARISON:  None. FINDINGS: There is no evidence of fracture, dislocation, or joint effusion. There is no evidence of arthropathy or other focal bone abnormality. Soft tissues are unremarkable. IMPRESSION: Negative. Electronically Signed   By: Ellery Plunkaniel R Mitchell M.D.   On: 12/16/2015 06:48   Ct Head Wo Contrast  Result Date: 12/16/2015 CLINICAL DATA:  History of airway tubes.  Altered mental status. EXAM: CT HEAD WITHOUT CONTRAST TECHNIQUE: Contiguous axial images were obtained from the base of the skull through the vertex without intravenous contrast. COMPARISON:  Head CT 04/10/2013 FINDINGS: Brain: No mass lesion, intraparenchymal hemorrhage or extra-axial collection. No evidence of acute cortical infarct. Brain parenchyma and CSF-containing spaces are normal for age. Vascular: No hyperdense vessel or atherosclerotic calcification. Skull: Normal visualized skull base, calvarium and extracranial soft tissues. Sinuses/Orbits: No sinus fluid levels or advanced mucosal thickening. No mastoid effusion. Normal orbits. IMPRESSION: Normal head CT. Electronically Signed   By: Deatra RobinsonKevin  Herman M.D.   On: 12/16/2015 06:57   Dg Chest Port 1 View  Result Date: 11/22/2015 CLINICAL DATA:  Shortness of Breath EXAM: PORTABLE CHEST 1 VIEW November 19, 2015 and chest CT November 17, 2015 FINDINGS: There has been slight partial clearing of consolidation from the right lower lobe. Patchy infiltrate in the left  perihilar region remains. A somewhat larger focus of consolidation in the  medial left base remains. The cavitary area in the left apex remains stable as does a somewhat ill-defined nodular opacity in the right upper lobe. No new areas of opacity are evident. Heart size and pulmonary vascular normal. No adenopathy. No bone lesions evident. IMPRESSION: Slight clearing of infiltrate from the right lower lobe. Other areas of opacity/consolidation remain stable. There remains cavitation in a lesion in the left upper lobe. No new opacities are evident. Heart size within normal limits. Electronically Signed   By: Bretta Bang III M.D.   On: 11/22/2015 17:25   Dg Chest Port 1 View  Result Date: 11/19/2015 CLINICAL DATA:  Shortness of breath and chest pain EXAM: PORTABLE CHEST 1 VIEW COMPARISON:  11/17/2015 FINDINGS: Cardiac shadow is stable. Persistent right lower lobe infiltrate is noted. Patchy changes are noted bilaterally consistent with that seen on recent CT examination. No acute bony abnormality is noted. IMPRESSION: Patchy changes bilaterally, worst in the right lower lobe consistent with multifocal pneumonia. Electronically Signed   By: Alcide Clever M.D.   On: 11/19/2015 11:21    Timika Muench, DO  [facility] Pager [phone]  If 7PM-7AM, please contact night-coverage [email or web address] Password [email or web address] 12/18/2015, 7:08 PM   LOS: 2 days

## 2015-12-18 NOTE — Progress Notes (Signed)
*  PRELIMINARY RESULTS* Echocardiogram 2D Echocardiogram has been performed.  Doristine SectionKristy H Nomie Buchberger 12/18/2015, 3:49 PM

## 2015-12-19 DIAGNOSIS — R4182 Altered mental status, unspecified: Secondary | ICD-10-CM

## 2015-12-19 DIAGNOSIS — R7881 Bacteremia: Secondary | ICD-10-CM

## 2015-12-19 DIAGNOSIS — F1120 Opioid dependence, uncomplicated: Secondary | ICD-10-CM

## 2015-12-19 DIAGNOSIS — A4901 Methicillin susceptible Staphylococcus aureus infection, unspecified site: Secondary | ICD-10-CM

## 2015-12-19 DIAGNOSIS — Z9114 Patient's other noncompliance with medication regimen: Secondary | ICD-10-CM

## 2015-12-19 DIAGNOSIS — F1910 Other psychoactive substance abuse, uncomplicated: Secondary | ICD-10-CM

## 2015-12-19 DIAGNOSIS — B9561 Methicillin susceptible Staphylococcus aureus infection as the cause of diseases classified elsewhere: Secondary | ICD-10-CM

## 2015-12-19 DIAGNOSIS — E871 Hypo-osmolality and hyponatremia: Secondary | ICD-10-CM

## 2015-12-19 DIAGNOSIS — A4181 Sepsis due to Enterococcus: Secondary | ICD-10-CM

## 2015-12-19 DIAGNOSIS — I38 Endocarditis, valve unspecified: Secondary | ICD-10-CM

## 2015-12-19 DIAGNOSIS — B952 Enterococcus as the cause of diseases classified elsewhere: Secondary | ICD-10-CM

## 2015-12-19 DIAGNOSIS — Z9119 Patient's noncompliance with other medical treatment and regimen: Secondary | ICD-10-CM

## 2015-12-19 DIAGNOSIS — Z515 Encounter for palliative care: Secondary | ICD-10-CM

## 2015-12-19 DIAGNOSIS — I330 Acute and subacute infective endocarditis: Secondary | ICD-10-CM

## 2015-12-19 LAB — CBC
HCT: 26.9 % — ABNORMAL LOW (ref 36.0–46.0)
HEMOGLOBIN: 8.7 g/dL — AB (ref 12.0–15.0)
MCH: 27.2 pg (ref 26.0–34.0)
MCHC: 32.3 g/dL (ref 30.0–36.0)
MCV: 84.1 fL (ref 78.0–100.0)
Platelets: 315 10*3/uL (ref 150–400)
RBC: 3.2 MIL/uL — ABNORMAL LOW (ref 3.87–5.11)
RDW: 16.3 % — ABNORMAL HIGH (ref 11.5–15.5)
WBC: 9.7 10*3/uL (ref 4.0–10.5)

## 2015-12-19 LAB — BASIC METABOLIC PANEL
Anion gap: 11 (ref 5–15)
BUN: <5 mg/dL — ABNORMAL LOW (ref 6–20)
CHLORIDE: 110 mmol/L (ref 101–111)
CO2: 20 mmol/L — AB (ref 22–32)
Calcium: 7.3 mg/dL — ABNORMAL LOW (ref 8.9–10.3)
Creatinine, Ser: 0.75 mg/dL (ref 0.44–1.00)
GFR calc non Af Amer: >60 mL/min (ref >60)
GLUCOSE: 106 mg/dL — AB (ref 65–99)
Potassium: 2.8 mmol/L — ABNORMAL LOW (ref 3.5–5.1)
Sodium: 141 mmol/L (ref 135–145)

## 2015-12-19 LAB — CULTURE, BLOOD (ROUTINE X 2)

## 2015-12-19 LAB — MAGNESIUM: Magnesium: 2 mg/dL (ref 1.7–2.4)

## 2015-12-19 LAB — HEMOGLOBIN AND HEMATOCRIT, BLOOD
HCT: 26 % — ABNORMAL LOW (ref 36.0–46.0)
Hemoglobin: 8.3 g/dL — ABNORMAL LOW (ref 12.0–15.0)

## 2015-12-19 MED ORDER — LORAZEPAM 1 MG PO TABS
1.0000 mg | ORAL_TABLET | ORAL | Status: AC
  Administered 2015-12-19: 1 mg via ORAL
  Filled 2015-12-19: qty 1

## 2015-12-19 MED ORDER — OXYCODONE HCL 5 MG PO TABS
5.0000 mg | ORAL_TABLET | ORAL | Status: AC
  Administered 2015-12-19: 5 mg via ORAL
  Filled 2015-12-19: qty 1

## 2015-12-19 MED ORDER — METHOCARBAMOL 750 MG PO TABS
750.0000 mg | ORAL_TABLET | Freq: Three times a day (TID) | ORAL | Status: DC
  Administered 2015-12-19 (×2): 750 mg via ORAL
  Filled 2015-12-19 (×2): qty 1

## 2015-12-19 MED ORDER — AMOXICILLIN-POT CLAVULANATE 875-125 MG PO TABS: 1.0000 | ORAL_TABLET | Freq: Two times a day (BID) | ORAL | 0 refills | Status: DC

## 2015-12-19 NOTE — Evaluation (Signed)
Physical Therapy Evaluation Patient Details Name: Catherine Hernandez MRN: [id number] DOB: [birth year] Today's Date: 12/19/2015   History of Present Illness  30 year old female with a history of polysubstance abuse (cocaine, heroin, tobacco, marijuana), asthma presentedwhen she was brought in by a friend secondary to altered mental status and complaints of pain in her lateral abdomen and left ankle. The patient is a poor historian secondary to her encephalopathy. Apparently, the patient was noted to be normal previously one week prior to admission when seen by her friend. The patient was recently discharged from the hospital after a stay from 11/16/2015 through 12/02/2015 when she was treated for sepsis secondary to MSSA and enterococcus faecalis bacteremia. 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  Clinical Impression   Pt admitted with above diagnosis. Pt currently with functional limitations due to the deficits listed below (see PT Problem List).   This will likely be a tough dc situation: she has been to SNF and left AMA, tells me she "couldn't stand it"; I would anticipate she will decline a SNF stay now; do her antibiotic needs skill her? Sounds like she does not have a stable home situation -- when asked about home setup she states, "it depends on where I go"; Will need SW involvement to help with options for dc (could her post-acute needs be met at an addiction rehab facility or a half-way house type situation?)   Pt will benefit from skilled PT to increase their independence and safety with mobility to allow discharge to the venue listed below.       Follow Up Recommendations No PT follow up    Equipment Recommendations  None recommended by PT    Recommendations for Other Services       Precautions / Restrictions Precautions Precautions: Fall      Mobility  Bed Mobility Overal bed mobility: Needs Assistance Bed  Mobility: Supine to Sit     Supine to sit: Supervision     General bed mobility comments: Supervision for safety and lines  Transfers Overall transfer level: Needs assistance Equipment used: None Transfers: Sit to/from Stand Sit to Stand: Supervision         General transfer comment: Supervision for safety and lines  Ambulation/Gait Ambulation/Gait assistance: Min guard Ambulation Distance (Feet): 25 Feet Assistive device: None (and pushing IV pole) Gait Pattern/deviations: Step-through pattern     General Gait Details: Cues to self-monitor for activity tolerance; walked from bed to bathroom to chair by the window; at times pushing IV pole  Stairs            Wheelchair Mobility    Modified Rankin (Stroke Patients Only)       Balance                                             Pertinent Vitals/Pain Pain Assessment: Faces Faces Pain Scale: Hurts whole lot Pain Location: Reports bilateral flank pain Pain Descriptors / Indicators: Aching Pain Intervention(s): Monitored during session;Premedicated before session;Repositioned    Home Living Family/patient expects to be discharged to:: Private residence Living Arrangements: Other (Comment) (unknown; when asked about home situation, she stated, "It depends on where I go") Available Help at Discharge:  (Unknown)   Home Access: Stairs to enter   Entrance Stairs-Number of Steps: 12-15 (states "it depends" on where she goes)  Additional Comments: Ms. Revell was not forthcoming with information re: her home situation; as I was asking home situation questions she stated "it depends on where I go"    Prior Function Level of Independence: Independent               Hand Dominance        Extremity/Trunk Assessment   Upper Extremity Assessment: Overall WFL for tasks assessed;Generalized weakness (inferred with noted cachexia)           Lower Extremity Assessment: Generalized  weakness (with noted cachexia)         Communication   Communication: No difficulties  Cognition Arousal/Alertness: Awake/alert Behavior During Therapy: Restless Overall Cognitive Status: Impaired/Different from baseline Area of Impairment: Safety/judgement;Awareness         Safety/Judgement: Decreased awareness of safety;Decreased awareness of deficits     General Comments: Given she left SNF AMA, it draws into question her understanfing of her medical condition; during session, Ms. Lonzo was extremely internally distracted by her thirst and was borderline perseverating on her desire for a soft drink    General Comments General comments (skin integrity, edema, etc.): Tearful during session; max encouragement to participate    Exercises     Assessment/Plan    PT Assessment Patient needs continued PT services  PT Problem List Decreased strength;Decreased activity tolerance;Decreased balance;Decreased mobility;Decreased knowledge of use of DME;Pain          PT Treatment Interventions DME instruction;Gait training;Stair training;Functional mobility training;Therapeutic activities;Therapeutic exercise;Balance training;Patient/family education    PT Goals (Current goals can be found in the Care Plan section)  Acute Rehab PT Goals Patient Stated Goal: did not state PT Goal Formulation: With patient Time For Goal Achievement: 01/02/16 Potential to Achieve Goals: Good    Frequency Min 3X/week (anticipate she will meet PT goals quickly)   Barriers to discharge   This will likely be a tough dc situation: she has been to SNF and left AMA, tells me she "couldn't stand it"; I would anticipate she will decline a SNF stay now; do her antibiotic needs skill her? Will need SW involvement to help with options for dc (could her post-acute needs be met at an addiction rehab facility or a half-way house type situation?)    Co-evaluation               End of Session   Activity  Tolerance: Patient tolerated treatment well Patient left: in chair;with call bell/phone within reach;with chair alarm set Nurse Communication: Mobility status         Time: 1101-1117 PT Time Calculation (min) (ACUTE ONLY): 16 min   Charges:   PT Evaluation $PT Eval Moderate Complexity: 1 Procedure     PT G CodesQuin Hoop 12/19/2015, 11:40 AM  Roney Marion, PT  Acute Rehabilitation Services Pager [phone] Office [phone]

## 2015-12-19 NOTE — Discharge Summary (Addendum)
Physician Discharge Summary  Catherine Hernandez BMW:[id number] DOB: 1985-10-07 DOA: 12/16/2015  PCP: No PCP Per Patient  Admit date: 12/16/2015 Discharge date: 12/19/2015  Admitted From: Home Disposition:  AGAINST MEDICAL ADVICE  Discharge Condition: AGAINST MEDICAL ADVICE CODE STATUS: full   Brief/Interim Summary: 30 year old female with a history of polysubstance abuse (cocaine, heroin, tobacco, marijuana), asthma presentedwhen she was brought in by a friend secondary to altered mental status and complaints of pain in her lateral abdomen and left ankle. The patient is a poor historian secondary to her encephalopathy. Apparently, the patient was noted to be normal previously one week prior to admission when seen by her friend. The patient was recently discharged from the hospital after a stay from 11/16/2015 through 12/02/2015 when she was treated for sepsis secondary to MSSA and enterococcus faecalis bacteremia. 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 At that time, she was not deemed to be a good surgical candidate for bowel repair or replacement. She was discharged to a skilled nursing facility on August 31 with instructions to finish intravenous antibiotics on 12/17/2015. Unfortunately, the patient left AGAINST MEDICAL ADVICE on 12/02/15.and did not finish her antibiotic therapy. According to the history, the PICC line was removed prior to the patient leaving AGAINST MEDICAL ADVICE. The patient had tricuspid valve vegetation on TTE. TEE done on 11/23/15--2 large shaggy and very mobile vegetations on both leaflets of the TV. Unfortunately, the patient went back to her home and cocaine usage, last used 24 hours prior to admission. Upon presentation, the patient was febrile and tachycardic. The patient was started back on Unasyn and ceftriaxone. Notsurprisingly , the patient's blood cultures have become positive once again. ID was  reconsulted.  Unfortunately, as the patient continued to feel better, she decided to leave AGAINST MEDICAL ADVICE. The risks of leaving AGAINST MEDICAL ADVICE including but not limited to worsening infection, sepsis, cardiac arrest, death were explained to the patient, and she expressed understanding.  Prescription was given to patient for Amox/Clav for 5 week supply prior to leaving AMA.  PICC line removed prior to pt leaving AMA  Discharge Diagnoses:  Sepsis -due to bacteremia with metastatic infection -patient has pulmonary infiltrates which representsmetastatic infection from bacteremia -Continueintravenous fluids -Continue Unasynand ceftriaxone -lactic acid 1.00  Tachypnea -12/15/2068 chest x-ray--unchanged RLL, RUL opacity -secondary to septic pulmonary emboli -98-100% on RA  MSSA & E faecalisBacteremia -11/16/15 blood culture--MSSA, Efaecalis -12/16/2015 blood cultures--Efaecalis only -Surveillance blood cultures remain neg -Continue Unasyn and ceftriaxone--12/16/15>>> -previously due to finish Abx 12/17/15 but only had ~16 days abx before leaving facility AMA -antibiotics per ID  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%, tricuspid valve vegetations -11/23/15 TEE--2 large shaggy and very mobile vegetations on both leaflets of the TV -previously seen by TCTS--felt to be poor surgical candidate -12/18/15 Echo (TTE)--decreased burden of vegetation with mod-severe TR  Pulmonary infiltrates/Septic emboli -11/17/15--V/Q scan--indeterminate -8/16/17CT chest without contrast--multifocal nodular consolidations with cavitations likely septic emboli; RLL consolidation/wedge-shaped infarct  Polysubstance abuse with active use of cocaine, heroin -Including tobacco, cocaine, heroin  Protein calorie malnutrition -Consult nutrition -continue supplements  Hypokalemia/Hypomagnesemia -replete -check mag--1.5-->repleted  Acute toxic encephalopathy -due to  cocaine/heroin and infection -improved after 24-48 hrs   Discharge Instructions     Medication List    STOP taking these medications   Ampicillin-Sulbactam 3 g in sodium chloride 0.9 % 100 mL   cefTRIAXone 2 g in dextrose 5 % 50 mL   ibuprofen 800 MG tablet  Commonly known as:  ADVIL,MOTRIN   pantoprazole 40 MG tablet Commonly known as:  PROTONIX     TAKE these medications   amoxicillin-clavulanate 875-125 MG tablet Commonly known as:  AUGMENTIN Take 1 tablet by mouth 2 (two) times daily.   feeding supplement Liqd Take 1 Container by mouth 2 (two) times daily between meals.       No Known Allergies  Consultations: ID   Procedures/Studies: Dg Chest 2 View  Result Date: 12/16/2015 CLINICAL DATA:  Back pain. History of heroin use. Altered mental status. EXAM: CHEST  2 VIEW COMPARISON:  Chest radiograph 11/22/2015 FINDINGS: Shallow lung inflation.  Unchanged cardiomediastinal contours. No pneumothorax or sizable pleural effusion. Airspace opacities in the right lower lobe are unchanged. Previously described cavitary right upper lobe lesion is not clearly seen on this study. IMPRESSION: Right lower lobe consolidation, unchanged from most recent chest radiograph, decreased compared to more remote priors such as 11/19/2015. Electronically Signed   By: Deatra Robinson M.D.   On: 12/16/2015 06:51   Dg Ankle Complete Left  Result Date: 12/16/2015 CLINICAL DATA:  Twisting injury today. Fell down 10 steps last week. EXAM: LEFT ANKLE COMPLETE - 3+ VIEW COMPARISON:  None. FINDINGS: There is no evidence of fracture, dislocation, or joint effusion. There is no evidence of arthropathy or other focal bone abnormality. Soft tissues are unremarkable. IMPRESSION: Negative. Electronically Signed   By: Ellery Plunk M.D.   On: 12/16/2015 06:48   Ct Head Wo Contrast  Result Date: 12/16/2015 CLINICAL DATA:  History of airway tubes.  Altered mental status. EXAM: CT HEAD WITHOUT CONTRAST  TECHNIQUE: Contiguous axial images were obtained from the base of the skull through the vertex without intravenous contrast. COMPARISON:  Head CT 04/10/2013 FINDINGS: Brain: No mass lesion, intraparenchymal hemorrhage or extra-axial collection. No evidence of acute cortical infarct. Brain parenchyma and CSF-containing spaces are normal for age. Vascular: No hyperdense vessel or atherosclerotic calcification. Skull: Normal visualized skull base, calvarium and extracranial soft tissues. Sinuses/Orbits: No sinus fluid levels or advanced mucosal thickening. No mastoid effusion. Normal orbits. IMPRESSION: Normal head CT. Electronically Signed   By: Deatra Robinson M.D.   On: 12/16/2015 06:57   Dg Chest Port 1 View  Result Date: 11/22/2015 CLINICAL DATA:  Shortness of Breath EXAM: PORTABLE CHEST 1 VIEW November 19, 2015 and chest CT November 17, 2015 FINDINGS: There has been slight partial clearing of consolidation from the right lower lobe. Patchy infiltrate in the left perihilar region remains. A somewhat larger focus of consolidation in the medial left base remains. The cavitary area in the left apex remains stable as does a somewhat ill-defined nodular opacity in the right upper lobe. No new areas of opacity are evident. Heart size and pulmonary vascular normal. No adenopathy. No bone lesions evident. IMPRESSION: Slight clearing of infiltrate from the right lower lobe. Other areas of opacity/consolidation remain stable. There remains cavitation in a lesion in the left upper lobe. No new opacities are evident. Heart size within normal limits. Electronically Signed   By: Bretta Bang III M.D.   On: 11/22/2015 17:25         Discharge Exam: Vitals:   12/18/15 2131 12/19/15 0507  BP: 126/80 95/79  Pulse: 89 (!) 113  Resp: 19 18  Temp: 98.6 F (37 C) 98.4 F (36.9 C)   Vitals:   12/18/15 1656 12/18/15 1956 12/18/15 2131 12/19/15 0507  BP: 137/88 (!) 117/98 126/80 95/79  Pulse: 75 87 89 (!) 113  Resp: 18 19 19 18   Temp: 99.5 F (37.5 C) 98.5 F (36.9 C) 98.6 F (37 C) 98.4 F (36.9 C)  TempSrc:      SpO2: 100% 100% 100% 98%  Weight:      Height:        General: Pt is alert, awake, not in acute distress Cardiovascular: RRR, S1/S2 +, no rubs, no gallops Respiratory: Bibasilar crackles, right greater than left. No wheezing Abdominal: Soft, NT, ND, bowel sounds + Extremities: trace LE edema, no cyanosis   The results of significant diagnostics from this hospitalization (including imaging, microbiology, ancillary and laboratory) are listed below for reference.    Significant Diagnostic Studies: Dg Chest 2 View  Result Date: 12/16/2015 CLINICAL DATA:  Back pain. History of heroin use. Altered mental status. EXAM: CHEST  2 VIEW COMPARISON:  Chest radiograph 11/22/2015 FINDINGS: Shallow lung inflation.  Unchanged cardiomediastinal contours. No pneumothorax or sizable pleural effusion. Airspace opacities in the right lower lobe are unchanged. Previously described cavitary right upper lobe lesion is not clearly seen on this study. IMPRESSION: Right lower lobe consolidation, unchanged from most recent chest radiograph, decreased compared to more remote priors such as 11/19/2015. Electronically Signed   By: Deatra RobinsonKevin  Herman M.D.   On: 12/16/2015 06:51   Dg Ankle Complete Left  Result Date: 12/16/2015 CLINICAL DATA:  Twisting injury today. Fell down 10 steps last week. EXAM: LEFT ANKLE COMPLETE - 3+ VIEW COMPARISON:  None. FINDINGS: There is no evidence of fracture, dislocation, or joint effusion. There is no evidence of arthropathy or other focal bone abnormality. Soft tissues are unremarkable. IMPRESSION: Negative. Electronically Signed   By: Ellery Plunkaniel R Mitchell M.D.   On: 12/16/2015 06:48   Ct Head Wo Contrast  Result Date: 12/16/2015 CLINICAL DATA:  History of airway tubes.  Altered mental status. EXAM: CT HEAD WITHOUT CONTRAST TECHNIQUE: Contiguous axial images were obtained from the  base of the skull through the vertex without intravenous contrast. COMPARISON:  Head CT 04/10/2013 FINDINGS: Brain: No mass lesion, intraparenchymal hemorrhage or extra-axial collection. No evidence of acute cortical infarct. Brain parenchyma and CSF-containing spaces are normal for age. Vascular: No hyperdense vessel or atherosclerotic calcification. Skull: Normal visualized skull base, calvarium and extracranial soft tissues. Sinuses/Orbits: No sinus fluid levels or advanced mucosal thickening. No mastoid effusion. Normal orbits. IMPRESSION: Normal head CT. Electronically Signed   By: Deatra RobinsonKevin  Herman M.D.   On: 12/16/2015 06:57   Dg Chest Port 1 View  Result Date: 11/22/2015 CLINICAL DATA:  Shortness of Breath EXAM: PORTABLE CHEST 1 VIEW November 19, 2015 and chest CT November 17, 2015 FINDINGS: There has been slight partial clearing of consolidation from the right lower lobe. Patchy infiltrate in the left perihilar region remains. A somewhat larger focus of consolidation in the medial left base remains. The cavitary area in the left apex remains stable as does a somewhat ill-defined nodular opacity in the right upper lobe. No new areas of opacity are evident. Heart size and pulmonary vascular normal. No adenopathy. No bone lesions evident. IMPRESSION: Slight clearing of infiltrate from the right lower lobe. Other areas of opacity/consolidation remain stable. There remains cavitation in a lesion in the left upper lobe. No new opacities are evident. Heart size within normal limits. Electronically Signed   By: Bretta BangWilliam  Woodruff III M.D.   On: 11/22/2015 17:25     Microbiology: Recent Results (from the past 240 hour(s))  Culture, blood (routine x 2)     Status: Abnormal   Collection Time:  12/16/15  5:40 AM  Result Value Ref Range Status   Specimen Description BLOOD LEFT ARM  Final   Special Requests IN PEDIATRIC BOTTLE  Final   Culture  Setup Time   Final    GRAM POSITIVE COCCI IN CHAINS IN PEDIATRIC  BOTTLE CRITICAL RESULT CALLED TO, READ BACK BY AND VERIFIED WITH: N BATCHELDER,PHARMD AT 0840 12/17/15 BY L BENFIELD    Culture ENTEROCOCCUS FAECALIS (A)  Final   Report Status 12/19/2015 FINAL  Final   Organism ID, Bacteria ENTEROCOCCUS FAECALIS  Final      Susceptibility   Enterococcus faecalis - MIC*    AMPICILLIN <=2 SENSITIVE Sensitive     VANCOMYCIN 2 SENSITIVE Sensitive     GENTAMICIN SYNERGY SENSITIVE Sensitive     * ENTEROCOCCUS FAECALIS  Blood Culture ID Panel (Reflexed)     Status: Abnormal   Collection Time: 12/16/15  5:40 AM  Result Value Ref Range Status   Enterococcus species DETECTED (A) NOT DETECTED Final    Comment: CRITICAL RESULT CALLED TO, READ BACK BY AND VERIFIED WITH: N BATCHELDER,PHARMD AT 0840 12/17/15 BY L BENFIELD    Vancomycin resistance NOT DETECTED NOT DETECTED Final   Listeria monocytogenes NOT DETECTED NOT DETECTED Final   Staphylococcus species NOT DETECTED NOT DETECTED Final   Staphylococcus aureus NOT DETECTED NOT DETECTED Final   Streptococcus species NOT DETECTED NOT DETECTED Final   Streptococcus agalactiae NOT DETECTED NOT DETECTED Final   Streptococcus pneumoniae NOT DETECTED NOT DETECTED Final   Streptococcus pyogenes NOT DETECTED NOT DETECTED Final   Acinetobacter baumannii NOT DETECTED NOT DETECTED Final   Enterobacteriaceae species NOT DETECTED NOT DETECTED Final   Enterobacter cloacae complex NOT DETECTED NOT DETECTED Final   Escherichia coli NOT DETECTED NOT DETECTED Final   Klebsiella oxytoca NOT DETECTED NOT DETECTED Final   Klebsiella pneumoniae NOT DETECTED NOT DETECTED Final   Proteus species NOT DETECTED NOT DETECTED Final   Serratia marcescens NOT DETECTED NOT DETECTED Final   Haemophilus influenzae NOT DETECTED NOT DETECTED Final   Neisseria meningitidis NOT DETECTED NOT DETECTED Final   Pseudomonas aeruginosa NOT DETECTED NOT DETECTED Final   Candida albicans NOT DETECTED NOT DETECTED Final   Candida glabrata NOT DETECTED  NOT DETECTED Final   Candida krusei NOT DETECTED NOT DETECTED Final   Candida parapsilosis NOT DETECTED NOT DETECTED Final   Candida tropicalis NOT DETECTED NOT DETECTED Final  Urine culture     Status: Abnormal   Collection Time: 12/16/15  5:50 AM  Result Value Ref Range Status   Specimen Description URINE, RANDOM  Final   Special Requests NONE  Final   Culture MULTIPLE SPECIES PRESENT, SUGGEST RECOLLECTION (A)  Final   Report Status 12/17/2015 FINAL  Final  Culture, blood (routine x 2)     Status: None (Preliminary result)   Collection Time: 12/16/15  6:12 AM  Result Value Ref Range Status   Specimen Description BLOOD LEFT ANTECUBITAL  Final   Special Requests BOTTLES DRAWN AEROBIC ONLY 5CC  Final   Culture NO GROWTH 3 DAYS  Final   Report Status PENDING  Incomplete  Culture, blood (routine x 2)     Status: None (Preliminary result)   Collection Time: 12/16/15  1:50 PM  Result Value Ref Range Status   Specimen Description BLOOD RIGHT FOOT  Final   Special Requests BOTTLES DRAWN AEROBIC AND ANAEROBIC 5CC  Final   Culture NO GROWTH 3 DAYS  Final   Report Status PENDING  Incomplete  MRSA  PCR Screening     Status: None   Collection Time: 12/16/15  1:59 PM  Result Value Ref Range Status   MRSA by PCR NEGATIVE NEGATIVE Final    Comment:        The GeneXpert MRSA Assay (FDA approved for NASAL specimens only), is one component of a comprehensive MRSA colonization surveillance program. It is not intended to diagnose MRSA infection nor to guide or monitor treatment for MRSA infections.   Culture, blood (routine x 2)     Status: None (Preliminary result)   Collection Time: 12/16/15  4:59 PM  Result Value Ref Range Status   Specimen Description BLOOD RIGHT FOOT  Final   Special Requests IN PEDIATRIC BOTTLE 0.5CC  Final   Culture NO GROWTH 3 DAYS  Final   Report Status PENDING  Incomplete  Culture, blood (Routine X 2) w Reflex to ID Panel     Status: None (Preliminary result)    Collection Time: 12/17/15  3:10 PM  Result Value Ref Range Status   Specimen Description BLOOD LEFT ANTECUBITAL  Final   Special Requests IN PEDIATRIC BOTTLE 5CC  Final   Culture NO GROWTH 2 DAYS  Final   Report Status PENDING  Incomplete  Culture, blood (Routine X 2) w Reflex to ID Panel     Status: None (Preliminary result)   Collection Time: 12/17/15  3:15 PM  Result Value Ref Range Status   Specimen Description BLOOD LEFT HAND  Final   Special Requests IN PEDIATRIC BOTTLE 4CC  Final   Culture NO GROWTH 2 DAYS  Final   Report Status PENDING  Incomplete     Labs: Basic Metabolic Panel:  Recent Labs Lab 12/16/15 0549 12/16/15 1621 12/17/15 0911 12/18/15 0554 12/19/15 0440  NA 127* 137 137 144 141  K 3.0* 2.9* 2.4* 3.0* 2.8*  CL 95* 108 109 116* 110  CO2 22 23 20* 21* 20*  GLUCOSE 136* 97 117* 133* 106*  BUN 12 5* <5* <5* <5*  CREATININE 1.00 0.84 0.73 0.75 0.75  CALCIUM 8.2* 7.5* 7.3* 7.3* 7.3*  MG  --   --  1.7 1.5* 2.0   Liver Function Tests:  Recent Labs Lab 12/16/15 0549  AST 20  ALT 13*  ALKPHOS 103  BILITOT 0.7  PROT 7.3  ALBUMIN 2.6*   No results for input(s): LIPASE, AMYLASE in the last 168 hours. No results for input(s): AMMONIA in the last 168 hours. CBC:  Recent Labs Lab 12/16/15 0546 12/16/15 0549 12/18/15 0554 12/19/15 0040 12/19/15 0440  WBC  --  12.3* 5.6  --  9.7  NEUTROABS  --  8.8*  --   --   --   HGB 7.8* 8.7* 5.7* 8.3* 8.7*  HCT 23.0* 26.2* 18.0* 26.0* 26.9*  MCV  --  80.1 82.9  --  84.1  PLT  --  270 204  --  315   Cardiac Enzymes:  Recent Labs Lab 12/16/15 1621  TROPONINI <0.03   BNP: Invalid input(s): POCBNP CBG: No results for input(s): GLUCAP in the last 168 hours.  Time coordinating discharge:  Greater than 30 minutes  Signed:  Kacey Vicuna, DO [facility] Pager: [phone] 12/19/2015, 5:53 PM

## 2015-12-19 NOTE — Progress Notes (Signed)
Pt getting dressed, states she wants to leave. Dr Tat in to talk with pt. AMA paperwork signed, pt awaiting IV team to remove PICC line.

## 2015-12-19 NOTE — Progress Notes (Signed)
This morning at 0800 pt boyfriend stated that he found a cut straw, a mirror and rolled up paper in the patient's room. Boyfriend stated he threw the straw and paper in the trash can. Security notified and searched pt room. Straw and mirror found but no residue of any illegal substance. Vital signs were 98.8 oral temp, 89 pulse, 34 respirations, 123/90 BP, 99% RA. Pt denied any use of illegal substance. Dr. Arbutus Leasat aware, will continue to monitor.

## 2015-12-19 NOTE — Progress Notes (Signed)
At 1830 pt left AMA

## 2015-12-19 NOTE — Consult Note (Signed)
Consultation Note Date: 12/19/2015   Patient Name: Catherine Hernandez  DOB: 1985-06-02  MRN: [id number]  Age / Sex: 30 y.o., female  PCP: No Pcp Per Patient Referring Physician: Catarina Hartshornavid Tat, MD  Reason for Consultation: Establishing goals of care and Psychosocial/spiritual support  HPI/Patient Profile: 30 y.o. female  with past medical history of polysubstance abuse, opioid use including heroin, endocarditis, bacteremia, septic pulmonary emboli admitted on 12/16/2015 with AMS and c/o pain in abdomen and lateral ankle. The pt was just dc'd from hospital after stay 11/16/15-12/02/15 after tx for sepsis 2/2 MSSA and enterococcus faecalis bacteremia. 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 She was deemed not a good candidate for surgery and was DC'd to a SNF for IV abx. She left AMA 12/02/15..   Clinical Assessment and Goals of Care: Pt is alert, very anxious. She has a runny nose, c/o "pain all over", specifically muscle cramps and intermittent nausea. I attempted to glean information on next of kin or non-peer source of support as well as someone who could speak for her in the event her clinical condition worsens. She states she has a mother ,Marlaine HindBonnie Ariel, who lives in [place] but verbalizes she does not have her phone number. Emergency contact is an Terese Doorrvin Zimmerman, boyfriend, with whom she states she broke up with this am. She has a 6843yr old son in father's custody. She denies knowing where her father is as well.   Pt reports snorting heroin the night of admission. She denies injecting since before hospital admission on 11/16/15. She has been in methadone treatment for approximately 540m in the past but verbalizes inability to afford the program. She currently states she does not have medicaid or other source of insurance. She is agreeable to meet with psychiatry in terms of  addiction/withdrawal mangment.   Pt at this point can speak for herself. She does have a mother in [place], [place] but states she doesn't know her mother's phone number. Her name is Marlaine HindBonnie Fragoso.. Deneis knowing how to reach her father or grandmother. Recommend that we find someone other than a SO for HCPOA going forward   SUMMARY OF RECOMMENDATIONS   Strongly recommend psych consult to addiction medicine specialist to mange withdrawal and opioid replacement. Concerned that pt will leave AMA otherwise. She is willing to meet with psychiatry. [facility] has two psychiatrists that are boarded in addiction medicine Will order chaplaincy to assist with obtaining next of kin HCPOA Pt ideally would do best in long term treatment with medication assisted treatment such as methadone or suboxone. Finances will be an issue unless we can obtain medicaid funding She is refusing to leave the area in that she has a 5143yr old son who is with his father but whom she does occasionally get to see but this still would be her best option in terms of long term sobriety. If not, intensive out pt program Code Status/Advance Care Planning:  Full code    Symptom Management:   Myalgias: Will add  robaxin 750 TID in addiiton to oxycodone 5 mg q6 PRN. This is likely under- dosing for her withdrawal but recognize the need to balance meds with her endocarditis  Nausea: Zofran PRN   Palliative Prophylaxis:   Aspiration, Frequent Pain Assessment, Oral Care and Turn Reposition  Additional Recommendations (Limitations, Scope, Preferences):  Full Scope Treatment  Psycho-social/Spiritual:   Desire for further Chaplaincy support:no  Additional Recommendations: Referral to Community Resources   Prognosis:   Unable to determine  Discharge Planning: To Be Determined      Primary Diagnoses: Present on Admission: . Altered mental status . Polysubstance dependence including opioid type drug with complication, continuous  use (HCC) . Protein-calorie malnutrition (HCC) . Staphylococcus aureus bacteremia . Hyponatremia . Enterococcal bacteremia . Endocarditis of native valve . Sepsis (HCC)   I have reviewed the medical record, interviewed the patient and family, and examined the patient. The following aspects are pertinent.  Past Medical History:  Diagnosis Date  . Anemia   . CAP (community acquired pneumonia) 11/16/2015  . Childhood asthma   . Enterococcal bacteremia   . Heroin abuse   . Hyponatremia   . Pneumonia 2007  . Polysubstance abuse    Social History   Social History  . Marital status: Single    Spouse name: N/A  . Number of children: N/A  . Years of education: N/A   Occupational History  . unemployed, former Film/video editor    Social History Main Topics  . Smoking status: Current Every Day Smoker    Packs/day: 0.50    Years: 10.00    Types: Cigarettes  . Smokeless tobacco: Never Used  . Alcohol use No  . Drug use:     Types: IV, Cocaine, Heroin, Oxycodone     Comment: 11/17/2015 "been only doing pills lately to try to get offf H"  . Sexual activity: Yes    Birth control/ protection: Condom   Other Topics Concern  . None   Social History Narrative  . None   Family History  Problem Relation Age of Onset  . Anesthesia problems Neg Hx   . Hypotension Neg Hx   . Malignant hyperthermia Neg Hx   . Pseudochol deficiency Neg Hx    Scheduled Meds: . ampicillin-sulbactam (UNASYN) IVPB 3 g  3 g Intravenous Q6H  . cefTRIAXone (ROCEPHIN) IVPB 2 gram/50 mL D5W (Pyxis)  2 g Intravenous Q12H  . feeding supplement (ENSURE ENLIVE)  237 mL Oral TID BM  . heparin  5,000 Units Subcutaneous Q8H  . methocarbamol  750 mg Oral TID  . pantoprazole  40 mg Oral Q0600  . sodium chloride flush  3 mL Intravenous Q12H   Continuous Infusions: . 0.9 % NaCl with KCl 40 mEq / L 125 mL/hr (12/19/15 0554)   PRN Meds:.acetaminophen **OR** acetaminophen, ketorolac, LORazepam, naLOXone (NARCAN)   injection, ondansetron **OR** ondansetron (ZOFRAN) IV, oxyCODONE, sodium chloride flush Medications Prior to Admission:  Prior to Admission medications   Medication Sig Start Date End Date Taking? Authorizing Provider  Ampicillin-Sulbactam 3 g in sodium chloride 0.9 % 100 mL Inject 3 g into the vein every 6 (six) hours. Patient not taking: Reported on 12/16/2015 12/02/15   Clydia Llano, MD  cefTRIAXone 2 g in dextrose 5 % 50 mL Inject 2 g into the vein every 12 (twelve) hours. Patient not taking: Reported on 12/16/2015 12/02/15   Clydia Llano, MD  feeding supplement (BOOST / RESOURCE BREEZE) LIQD Take 1 Container by mouth 2 (two) times daily between  meals. Patient not taking: Reported on 12/16/2015 12/02/15   Clydia Llano, MD  ibuprofen (ADVIL,MOTRIN) 800 MG tablet Take 1 tablet (800 mg total) by mouth every 8 (eight) hours as needed for fever, headache or mild pain. Patient not taking: Reported on 12/16/2015 12/02/15   Clydia Llano, MD  pantoprazole (PROTONIX) 40 MG tablet Take 1 tablet (40 mg total) by mouth daily at 6 (six) AM. Patient not taking: Reported on 12/16/2015 12/02/15   Clydia Llano, MD   No Known Allergies Review of Systems  Constitutional: Positive for appetite change, chills and fatigue.  HENT: Positive for congestion.   Eyes: Negative.   Respiratory: Negative.   Cardiovascular: Positive for chest pain.  Gastrointestinal: Positive for nausea.  Endocrine: Negative.   Genitourinary: Negative.   Musculoskeletal: Positive for arthralgias and myalgias.  Skin: Negative.   Allergic/Immunologic: Negative.   Neurological: Negative.   Hematological: Negative.   Psychiatric/Behavioral: Positive for agitation, decreased concentration, dysphoric mood and sleep disturbance. The patient is nervous/anxious.     Physical Exam  Vital Signs: BP 95/79 (BP Location: Left Arm)   Pulse (!) 113   Temp 98.4 F (36.9 C)   Resp 18   Ht 5\' 4"  (1.626 m)   Wt 43.1 kg (95 lb)   SpO2 98%   BMI  16.31 kg/m  Pain Assessment: 0-10   Pain Score: 7    SpO2: SpO2: 98 % O2 Device:SpO2: 98 % O2 Flow Rate: .   IO: Intake/output summary:  Intake/Output Summary (Last 24 hours) at 12/19/15 1110 Last data filed at 12/19/15 0436  Gross per 24 hour  Intake             1327 ml  Output                0 ml  Net             1327 ml    LBM: Last BM Date: 12/18/15 Baseline Weight: Weight: 43.1 kg (95 lb) Most recent weight: Weight: 43.1 kg (95 lb)     Palliative Assessment/Data:   Flowsheet Rows   Flowsheet Row Most Recent Value  Intake Tab  Referral Department  Hospitalist  Unit at Time of Referral  Med/Surg Unit  Palliative Care Primary Diagnosis  Cardiac  Date Notified  12/16/15  Palliative Care Type  New Palliative care  Reason for referral  Clarify Goals of Care  Date of Admission  12/16/15  Date first seen by Palliative Care  12/19/15  # of days Palliative referral response time  3 Day(s)  # of days IP prior to Palliative referral  0  Clinical Assessment  Palliative Performance Scale Score  70%  Pain Max last 24 hours  10  Pain Min Last 24 hours  10  Dyspnea Max Last 24 Hours  0  Dyspnea Min Last 24 hours  0  Nausea Max Last 24 Hours  7  Nausea Min Last 24 Hours  2  Anxiety Max Last 24 Hours  10  Anxiety Min Last 24 Hours  10  Other Max Last 24 Hours  0  Psychosocial & Spiritual Assessment  Palliative Care Outcomes  Patient/Family meeting held?  Yes  Who was at the meeting?  pt only. Staes she has no support. Has a mother in [place] but doesn't know her number      Time In: 0900 Time Out: 1010 Time Total: 70 min Greater than 50%  of this time was spent counseling and coordinating care related to  the above assessment and plan. Staffed with Dr. Arbutus Leas  Signed by: Irean Hong, NP   Please contact Palliative Medicine Team phone at [phone] for questions and concerns.  For individual provider: See Loretha Stapler

## 2015-12-20 LAB — TYPE AND SCREEN
ABO/RH(D): O POS
ANTIBODY SCREEN: NEGATIVE
UNIT DIVISION: 0
Unit division: 0

## 2015-12-20 LAB — GC/CHLAMYDIA PROBE AMP (~~LOC~~) NOT AT ARMC
Chlamydia: NEGATIVE
NEISSERIA GONORRHEA: NEGATIVE

## 2015-12-21 LAB — CULTURE, BLOOD (ROUTINE X 2)
CULTURE: NO GROWTH
CULTURE: NO GROWTH
Culture: NO GROWTH

## 2015-12-22 LAB — CULTURE, BLOOD (ROUTINE X 2)
CULTURE: NO GROWTH
Culture: NO GROWTH

## 2015-12-28 ENCOUNTER — Ambulatory Visit: Payer: Medicaid Other | Admitting: Internal Medicine

## 2016-03-14 ENCOUNTER — Inpatient Hospital Stay (HOSPITAL_COMMUNITY)
Admission: EM | Admit: 2016-03-14 | Discharge: 2016-03-14 | DRG: 193 | Disposition: A | Discharge status: Home / Self Care | Payer: Medicaid Other | Attending: Family Medicine | Admitting: Family Medicine

## 2016-03-14 ENCOUNTER — Encounter (HOSPITAL_COMMUNITY): Payer: Self-pay | Admitting: Family Medicine

## 2016-03-14 ENCOUNTER — Emergency Department (HOSPITAL_COMMUNITY): Payer: Medicaid Other

## 2016-03-14 DIAGNOSIS — F1910 Other psychoactive substance abuse, uncomplicated: Secondary | ICD-10-CM | POA: Diagnosis present

## 2016-03-14 DIAGNOSIS — E43 Unspecified severe protein-calorie malnutrition: Secondary | ICD-10-CM | POA: Diagnosis present

## 2016-03-14 DIAGNOSIS — E46 Unspecified protein-calorie malnutrition: Secondary | ICD-10-CM

## 2016-03-14 DIAGNOSIS — D649 Anemia, unspecified: Secondary | ICD-10-CM | POA: Diagnosis not present

## 2016-03-14 DIAGNOSIS — Z8701 Personal history of pneumonia (recurrent): Secondary | ICD-10-CM | POA: Diagnosis not present

## 2016-03-14 DIAGNOSIS — R0600 Dyspnea, unspecified: Secondary | ICD-10-CM | POA: Diagnosis not present

## 2016-03-14 DIAGNOSIS — K089 Disorder of teeth and supporting structures, unspecified: Secondary | ICD-10-CM

## 2016-03-14 DIAGNOSIS — E871 Hypo-osmolality and hyponatremia: Secondary | ICD-10-CM | POA: Diagnosis present

## 2016-03-14 DIAGNOSIS — D72829 Elevated white blood cell count, unspecified: Secondary | ICD-10-CM | POA: Diagnosis present

## 2016-03-14 DIAGNOSIS — E876 Hypokalemia: Secondary | ICD-10-CM | POA: Diagnosis present

## 2016-03-14 DIAGNOSIS — J188 Other pneumonia, unspecified organism: Secondary | ICD-10-CM | POA: Diagnosis present

## 2016-03-14 DIAGNOSIS — Z91148 Patient's other noncompliance with medication regimen for other reason: Secondary | ICD-10-CM

## 2016-03-14 DIAGNOSIS — J189 Pneumonia, unspecified organism: Principal | ICD-10-CM

## 2016-03-14 DIAGNOSIS — F1110 Opioid abuse, uncomplicated: Secondary | ICD-10-CM | POA: Diagnosis not present

## 2016-03-14 DIAGNOSIS — Z9114 Patient's other noncompliance with medication regimen: Secondary | ICD-10-CM

## 2016-03-14 DIAGNOSIS — F1120 Opioid dependence, uncomplicated: Secondary | ICD-10-CM | POA: Diagnosis present

## 2016-03-14 DIAGNOSIS — Z590 Homelessness: Secondary | ICD-10-CM | POA: Diagnosis not present

## 2016-03-14 DIAGNOSIS — F17210 Nicotine dependence, cigarettes, uncomplicated: Secondary | ICD-10-CM | POA: Diagnosis present

## 2016-03-14 DIAGNOSIS — E860 Dehydration: Secondary | ICD-10-CM | POA: Diagnosis present

## 2016-03-14 DIAGNOSIS — R0602 Shortness of breath: Secondary | ICD-10-CM | POA: Diagnosis present

## 2016-03-14 LAB — I-STAT CG4 LACTIC ACID, ED: Lactic Acid, Venous: 0.98 mmol/L (ref 0.5–1.9)

## 2016-03-14 LAB — COMPREHENSIVE METABOLIC PANEL
ALBUMIN: 2.4 g/dL — AB (ref 3.5–5.0)
ALT: 10 U/L — ABNORMAL LOW (ref 14–54)
AST: 21 U/L (ref 15–41)
Alkaline Phosphatase: 89 U/L (ref 38–126)
Anion gap: 8 (ref 5–15)
BILIRUBIN TOTAL: 0.7 mg/dL (ref 0.3–1.2)
BUN: 12 mg/dL (ref 6–20)
CHLORIDE: 96 mmol/L — AB (ref 101–111)
CO2: 29 mmol/L (ref 22–32)
Calcium: 7.6 mg/dL — ABNORMAL LOW (ref 8.9–10.3)
Creatinine, Ser: 0.54 mg/dL (ref 0.44–1.00)
GFR calc Af Amer: >60 mL/min (ref >60)
GFR calc non Af Amer: >60 mL/min (ref >60)
GLUCOSE: 95 mg/dL (ref 65–99)
POTASSIUM: 2.9 mmol/L — AB (ref 3.5–5.1)
SODIUM: 133 mmol/L — AB (ref 135–145)
TOTAL PROTEIN: 7.4 g/dL (ref 6.5–8.1)

## 2016-03-14 LAB — CBC WITH DIFFERENTIAL/PLATELET
BASOS ABS: 0 10*3/uL (ref 0.0–0.1)
BASOS PCT: 0 %
EOS ABS: 0 10*3/uL (ref 0.0–0.7)
Eosinophils Relative: 0 %
HEMATOCRIT: 18.7 % — AB (ref 36.0–46.0)
Hemoglobin: 5.7 g/dL — CL (ref 12.0–15.0)
Lymphocytes Relative: 15 %
Lymphs Abs: 1.7 10*3/uL (ref 0.7–4.0)
MCH: 23.4 pg — ABNORMAL LOW (ref 26.0–34.0)
MCHC: 30.5 g/dL (ref 30.0–36.0)
MCV: 76.6 fL — ABNORMAL LOW (ref 78.0–100.0)
MONO ABS: 0.8 10*3/uL (ref 0.1–1.0)
Monocytes Relative: 7 %
NEUTROS ABS: 9.1 10*3/uL — AB (ref 1.7–7.7)
NEUTROS PCT: 78 %
Platelets: 241 10*3/uL (ref 150–400)
RBC: 2.44 MIL/uL — ABNORMAL LOW (ref 3.87–5.11)
RDW: 18.1 % — AB (ref 11.5–15.5)
WBC: 11.6 10*3/uL — ABNORMAL HIGH (ref 4.0–10.5)

## 2016-03-14 MED ORDER — SODIUM CHLORIDE 0.9 % IV BOLUS (SEPSIS)
1000.0000 mL | Freq: Once | INTRAVENOUS | Status: AC
  Administered 2016-03-14: 1000 mL via INTRAVENOUS

## 2016-03-14 MED ORDER — POTASSIUM CHLORIDE CRYS ER 20 MEQ PO TBCR
40.0000 meq | EXTENDED_RELEASE_TABLET | Freq: Once | ORAL | Status: AC
  Administered 2016-03-14: 40 meq via ORAL
  Filled 2016-03-14: qty 2

## 2016-03-14 MED ORDER — SODIUM CHLORIDE 0.9 % IV SOLN
INTRAVENOUS | Status: DC
  Administered 2016-03-14: 12:00:00 via INTRAVENOUS

## 2016-03-14 MED ORDER — LORAZEPAM 2 MG/ML IJ SOLN
1.0000 mg | Freq: Once | INTRAMUSCULAR | Status: AC
  Administered 2016-03-14: 1 mg via INTRAVENOUS
  Filled 2016-03-14: qty 1

## 2016-03-14 MED ORDER — DEXTROSE 5 % IV SOLN: 500.0000 mg | INTRAVENOUS | Status: DC

## 2016-03-14 MED ORDER — SODIUM CHLORIDE 0.9 % IV SOLN
10.0000 mL/h | Freq: Once | INTRAVENOUS | Status: DC
Start: 2016-03-14 — End: 2016-03-14

## 2016-03-14 MED ORDER — DEXTROSE 5 % IV SOLN
1.0000 g | INTRAVENOUS | Status: DC
  Administered 2016-03-14: 1 g via INTRAVENOUS
  Filled 2016-03-14: qty 10

## 2016-03-14 NOTE — H&P (Signed)
History and Physical  Catherine Dittyngela N Devora ONG:[id number] DOB: [birth year] DOA: 03/14/2016  Referring physician: Freida BusmanAllen, MD PCP: No PCP Per Patient   Chief Complaint: SOB   HPI: Catherine Hernandez is a 30 y.o. female with history of recent pneumonia as well as heroin addiction and current homelessness presents with several day history of productive cough as well as weakness and subjective myalgias. She also has severe fatigue. Did not finish her entire treatment course for her recent diagnosis of pneumonia. Denies any vomiting or diarrhea. States that she snorts heroin and does not inject it. Patient denies any urinary symptoms. Was in custody with [employer] due to panhandling and patient began to complain of being short of breath and having chest discomfort and patient was transported here.  She was seen in the ED and noted to have multifocal pneumonia on CXR.  In addition, her hemoglobin was noted to be 5.7.  She was ordered to get 2 units of PRBC, antibiotics started and admission was requested.   Review of Systems: All systems reviewed and apart from history of presenting illness, are negative.  Past Medical History:  Diagnosis Date  . Anemia   . CAP (community acquired pneumonia) 11/16/2015  . Childhood asthma   . Enterococcal bacteremia   . Heroin abuse   . Hyponatremia   . Pneumonia 2007  . Polysubstance abuse    Past Surgical History:  Procedure Laterality Date  . NO PAST SURGERIES    . TEE WITHOUT CARDIOVERSION N/A 11/23/2015   Procedure: TRANSESOPHAGEAL ECHOCARDIOGRAM (TEE) WITH ANESTHESIA;  Surgeon: Quintella Reichertraci R Turner, MD;  Location: [facility];  Service: Cardiovascular;  Laterality: N/A;   Social History:  reports that she has been smoking Cigarettes.  She has a 5.00 pack-year smoking history. She has never used smokeless tobacco. She reports that she uses drugs, including IV, Cocaine, Heroin, and Oxycodone. She reports that she does not drink alcohol.   No Known Allergies  Family  History  Problem Relation Age of Onset  . Anesthesia problems Neg Hx   . Hypotension Neg Hx   . Malignant hyperthermia Neg Hx   . Pseudochol deficiency Neg Hx     Prior to Admission medications   Medication Sig Start Date End Date Taking? Authorizing Provider  amoxicillin-clavulanate (AUGMENTIN) 875-125 MG tablet Take 1 tablet by mouth 2 (two) times daily. Patient not taking: Reported on 03/14/2016 12/19/15   Catarina Hartshornavid Tat, MD  feeding supplement (BOOST / RESOURCE BREEZE) LIQD Take 1 Container by mouth 2 (two) times daily between meals. Patient not taking: Reported on 03/14/2016 12/02/15   Clydia LlanoMutaz Elmahi, MD   Physical Exam: Vitals:   03/14/16 0859 03/14/16 0909 03/14/16 0912 03/14/16 1156  BP:  116/67  112/72  Pulse:  115  106  Resp:  24  (!) 29  Temp:  99.4 F (37.4 C)    TempSrc:  Oral    SpO2: 99% 100% 97% 96%   Constitutional: She is oriented to person, place, and time. She appears cachectic. She is easily aroused. She has a sickly appearance. She appears chronically ill. No apparent distress.  Oriented x 4.   HENT: Head: Normocephalic and atraumatic.  Eyes: Conjunctivae, EOM and lids are normal. Pupils are equal, round, and reactive to light.  Neck: Normal range of motion. Neck supple. No tracheal deviation present. No thyroid mass present.  Cardiovascular: Regular rhythm and normal heart sounds.  Tachycardia present.  Exam reveals no gallop. No murmur heard. Pulmonary/Chest: Effort normal. No stridor.  No respiratory distress. She has decreased breath sounds in the left middle field and the left lower field. She has no wheezes. She has no rhonchi. She has no rales.  Abdominal: Soft. Normal appearance and bowel sounds are normal. She exhibits no distension. There is no tenderness. There is no rebound and no CVA tenderness.  Musculoskeletal: Normal range of motion. She exhibits no edema or tenderness.  Neurological: She is oriented to person, place, and time and easily aroused. She has  normal strength. No cranial nerve deficit or sensory deficit.   Skin: Skin is warm and dry. No abrasion and no rash noted.  Psychiatric: She has a normal mood and affect. Her speech is normal and behavior is normal  Labs on Admission:  Basic Metabolic Panel:  Recent Labs Lab 03/14/16 1005  NA 133*  K 2.9*  CL 96*  CO2 29  GLUCOSE 95  BUN 12  CREATININE 0.54  CALCIUM 7.6*   Liver Function Tests:  Recent Labs Lab 03/14/16 1005  AST 21  ALT 10*  ALKPHOS 89  BILITOT 0.7  PROT 7.4  ALBUMIN 2.4*   No results for input(s): LIPASE, AMYLASE in the last 168 hours. No results for input(s): AMMONIA in the last 168 hours. CBC:  Recent Labs Lab 03/14/16 1005  WBC 11.6*  NEUTROABS 9.1*  HGB 5.7*  HCT 18.7*  MCV 76.6*  PLT 241   Cardiac Enzymes: No results for input(s): CKTOTAL, CKMB, CKMBINDEX, TROPONINI in the last 168 hours.  BNP (last 3 results) No results for input(s): PROBNP in the last 8760 hours. CBG: No results for input(s): GLUCAP in the last 168 hours.  Radiological Exams on Admission: Dg Chest 2 View  Result Date: 03/14/2016 CLINICAL DATA:  Chest pain. EXAM: CHEST  2 VIEW COMPARISON:  12/16/2015. FINDINGS: Mediastinum hilar structures stable. Multifocal bilateral cavitary pulmonary lesions are noted. Multifocal cavitary pneumonia could present this fashion. Of malignancy would be less likely. Small left pleural effusion. No pneumothorax . Heart size normal. IMPRESSION: Multifocal cavitary pulmonary infiltrates most consistent with multifocal cavitary pneumonia. Chest CT may prove useful for further evaluation. These results will be called to the ordering clinician or representative by the Radiologist Assistant, and communication documented in the PACS or zVision Dashboard. Electronically Signed   By: Maisie Fushomas  Register   On: 03/14/2016 10:07    EKG: Independently reviewed.   Assessment/Plan Principal Problem:   Community acquired pneumonia Active Problems:    Symptomatic anemia   Opioid use disorder, severe, dependence (HCC)   Hyponatremia   Protein-calorie malnutrition (HCC)   Poor dentition   Polysubstance abuse   Dyspnea   Hypokalemia   Noncompliance with medication regimen   Heroin abuse   Leukocytosis   Multifocal pneumonia  Community acquired multifocal pneumonia - Pneumonia order set, IV antibiotics ceftriaxone / azithromycin ordered.  Supplemental oxygen as needed.  Check legionella, viral panel, strep pneumo.  Pt denies history of TB.  Follow clinically.    Symptomatic anemia - severe - Transfuse 2 units PRBCs. Start iron supplement.  Check anemia panel, Hemoccult stools.   Protein calorie malnutrition - severe - Consult dietitian.   Leukocytosis - secondary to pneumonia  Hyponatremia - secondary to dehydration, should improve with IV NS.  Follow BMP.  Polysubstance abuse - check urine drug screen, monitor for withdrawal, use ativan and clonidine for withdrawal symptoms as needed. Consult Location managersocial worker and care manager.   Hypokalemia - replace orally and in IVF, check magnesium level.  Dyspnea - secondary to  pneumonia and severe anemia - oxygen ordered by [place].    Noncompliance - Pt has long history of leaving against medical advice.  I strongly advised patient to allow for medical treatment and I discussed the risks of not getting medical treatment and leaving including the high likelihood that she would die without treatment and she verbalized understanding.  She said that she would stay for the blood to be transfused and then decide if she would accept further treatment.    DVT Prophylaxis: enoxaparin Code Status: full  Family Communication:  No family at bedside Disposition Plan: TBD will need to consult social worker and care manager   Time spent: 63 mins  Standley Dakins, MD [facility] Pager [phone]  If 7PM-7AM, please contact night-coverage [email or web address] Password [email or web address] 03/14/2016, 12:10 PM

## 2016-03-14 NOTE — ED Notes (Signed)
Phlebotomy called regarding need for lab draw.

## 2016-03-14 NOTE — ED Notes (Addendum)
Pt refused to be stuck anymore for lab draws. Second set of blood cultures unable to be drawn at this time.

## 2016-03-14 NOTE — ED Provider Notes (Signed)
[facility]-EMERGENCY DEPT Provider Note   CSN: [id number] Arrival date & time: 03/14/16  4098     History   Chief Complaint Chief Complaint  Patient presents with  . Shortness of Breath  . Chest Pain    HPI Catherine Hernandez is a 30 y.o. female.  30 year old female with history of recent pneumonia as well as heroin addiction she is homeless presents with several day history of productive cough as well as weakness and subjective myalgias. Did not finish her entire treatment course for her recent diagnosis of pneumonia. Denies any vomiting or diarrhea. States that she snorts heroin and does not inject it. Patient denies any urinary symptoms. Was in custody with [employer] due to panhandling and patient began to complain of being short of breath and having chest discomfort and patient was transported here.      Past Medical History:  Diagnosis Date  . Anemia   . CAP (community acquired pneumonia) 11/16/2015  . Childhood asthma   . Enterococcal bacteremia   . Heroin abuse   . Hyponatremia   . Pneumonia 2007  . Polysubstance abuse     Patient Active Problem List   Diagnosis Date Noted  . Enterococcal sepsis (HCC) 12/19/2015  . Palliative care encounter   . Hypokalemia   . Noncompliance with medication regimen   . Altered mental status 12/16/2015  . Ankle sprain 12/16/2015  . 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 Tachypnea   . Dyspnea   . 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 Polysubstance abuse   . Enterococcal bacteremia 11/18/2015  . Staphylococcus aureus bacteremia 11/18/2015  . Normocytic anemia 11/18/2015  . Protein-calorie malnutrition (HCC) 11/18/2015  . Poor dentition 11/18/2015  . Malnutrition of moderate degree 11/18/2015  . AKI (acute kidney injury) (HCC)   . CAP (community acquired pneumonia) 11/17/2015  . Opioid use disorder, severe, dependence (HCC) 11/17/2015  . Opioid use with withdrawal (HCC) 11/17/2015  . Hyponatremia 11/17/2015  . Acute renal  failure (HCC) 11/17/2015  . Hypocalcemia 11/17/2015  . Nonspecific abnormal electrocardiogram (ECG) (EKG) 11/17/2015  . Thrombocytopenia (HCC) 11/17/2015  . Sepsis (HCC) 11/16/2015    Past Surgical History:  Procedure Laterality Date  . NO PAST SURGERIES    . TEE WITHOUT CARDIOVERSION N/A 11/23/2015   Procedure: TRANSESOPHAGEAL ECHOCARDIOGRAM (TEE) WITH ANESTHESIA;  Surgeon: Quintella Reichert, MD;  Location: [facility];  Service: Cardiovascular;  Laterality: N/A;    OB History    Gravida Para Term Preterm AB Living   1 1 1  0 0 1   SAB TAB Ectopic Multiple Live Births   0 0 0 0 1       Home Medications    Prior to Admission medications   Medication Sig Start Date End Date Taking? Authorizing Provider  amoxicillin-clavulanate (AUGMENTIN) 875-125 MG tablet Take 1 tablet by mouth 2 (two) times daily. Patient not taking: Reported on 03/14/2016 12/19/15   Catarina Hartshorn, MD  feeding supplement (BOOST / RESOURCE BREEZE) LIQD Take 1 Container by mouth 2 (two) times daily between meals. Patient not taking: Reported on 03/14/2016 12/02/15   Clydia Llano, MD    Family History Family History  Problem Relation Age of Onset  . Anesthesia problems Neg Hx   . Hypotension Neg Hx   . Malignant hyperthermia Neg Hx   . Pseudochol deficiency Neg Hx     Social History Social History  Substance Use Topics  . Smoking status: Current Every Day Smoker    Packs/day: 0.50  Years: 10.00    Types: Cigarettes  . Smokeless tobacco: Never Used  . Alcohol use No     Allergies   Patient has no known allergies.   Review of Systems Review of Systems  All other systems reviewed and are negative.    Physical Exam Updated Vital Signs BP 116/67 (BP Location: Right Arm)   Pulse 115   Temp 99.4 F (37.4 C) (Oral)   Resp 24   SpO2 97%   Physical Exam  Constitutional: She is oriented to person, place, and time. She appears cachectic. She is easily aroused. She has a sickly appearance. She  appears ill.  HENT:  Head: Normocephalic and atraumatic.  Eyes: Conjunctivae, EOM and lids are normal. Pupils are equal, round, and reactive to light.  Neck: Normal range of motion. Neck supple. No tracheal deviation present. No thyroid mass present.  Cardiovascular: Regular rhythm and normal heart sounds.  Tachycardia present.  Exam reveals no gallop.   No murmur heard. Pulmonary/Chest: Effort normal. No stridor. No respiratory distress. She has decreased breath sounds in the left middle field and the left lower field. She has no wheezes. She has no rhonchi. She has no rales.  Abdominal: Soft. Normal appearance and bowel sounds are normal. She exhibits no distension. There is no tenderness. There is no rebound and no CVA tenderness.  Musculoskeletal: Normal range of motion. She exhibits no edema or tenderness.  Neurological: She is oriented to person, place, and time and easily aroused. She has normal strength. No cranial nerve deficit or sensory deficit. GCS eye subscore is 4. GCS verbal subscore is 5. GCS motor subscore is 6.  Skin: Skin is warm and dry. No abrasion and no rash noted.  Psychiatric: She has a normal mood and affect. Her speech is normal and behavior is normal.  Nursing note and vitals reviewed.    ED Treatments / Results  Labs (all labs ordered are listed, but only abnormal results are displayed) Labs Reviewed  CULTURE, BLOOD (ROUTINE X 2)  CULTURE, BLOOD (ROUTINE X 2)  CBC WITH DIFFERENTIAL/PLATELET  COMPREHENSIVE METABOLIC PANEL  I-STAT CG4 LACTIC ACID, ED    EKG  EKG Interpretation  Date/Time:  Tuesday March 14 2016 09:10:57 EST Ventricular Rate:  111 PR Interval:    QRS Duration: 90 QT Interval:  334 QTC Calculation: 454 R Axis:   93 Text Interpretation:  Sinus tachycardia Borderline right axis deviation No significant change since last tracing Confirmed by Tinamarie Przybylski  MD, Shantara Goosby ([id number]) on 03/14/2016 9:40:49 AM       Radiology No results  found.  Procedures Procedures (including critical care time)  Medications Ordered in ED Medications  sodium chloride 0.9 % bolus 1,000 mL (not administered)  0.9 %  sodium chloride infusion (not administered)     Initial Impression / Assessment and Plan / ED Course  I have reviewed the triage vital signs and the nursing notes.  Pertinent labs & imaging results that were available during my care of the patient were reviewed by me and considered in my medical decision making (see chart for details).  Clinical Course     Patient's chest x-ray consistent with pneumonia. Blood cultures obtained. Patient has a leukocytosis noted as well as she is anemic. Has had anemia before in the past. Denies any rectal bleeding. Will order 2 units of packed red blood cells. Patient's tachycardic and has been given IV fluids. [facility]  Final Clinical Impressions(s) / ED Diagnoses   Final  diagnoses:  SOB (shortness of breath)    New Prescriptions New Prescriptions   No medications on file     Lorre NickAnthony Magdalynn Davilla, MD 03/14/16 1131

## 2016-03-14 NOTE — ED Notes (Addendum)
After explaining and reiterating to pt the need to stay for medical treatment due to labs results, pt verbalized she understood the risks involved and would return if she started feeling worse. Pt stated "my ride is waiting for me and at least I can get something to help with pain out there". When asked to rate pain at this time, pt denied pain and rated 0/10.

## 2016-03-14 NOTE — ED Notes (Signed)
Attempted blood draw x1 unsuccessful. 

## 2016-03-14 NOTE — ED Triage Notes (Signed)
Per EMS, pt is homeless and was picked up [employer] when she started complaining of chest pain. Pt has a hx of pneumonia 2 months ago and reports being unable to finish antibiotics. Pt reports having a productive cough for the last 3 weeks and chest pain 8/10 that has worsened over the last 3 days and worse with movement and coughing. No n/v. Pt received aspirin from EMS. Pt reports having a hx of heroin abuse.

## 2016-03-14 NOTE — ED Notes (Signed)
Bed: WA03 Expected date:  Expected time:  Means of arrival:  Comments: 30 yo cough

## 2016-03-14 NOTE — ED Notes (Signed)
Attempted to collect labs from PT arm, PT info me needle hurt and request to take out. RN have been made aware

## 2016-03-15 ENCOUNTER — Inpatient Hospital Stay (HOSPITAL_BASED_OUTPATIENT_CLINIC_OR_DEPARTMENT_OTHER)
Admission: EM | Admit: 2016-03-15 | Discharge: 2016-03-15 | DRG: 871 | Discharge status: Against Medical Advice | Payer: Medicaid Other | Attending: Family Medicine | Admitting: Family Medicine

## 2016-03-15 ENCOUNTER — Encounter (HOSPITAL_BASED_OUTPATIENT_CLINIC_OR_DEPARTMENT_OTHER): Payer: Self-pay | Admitting: *Deleted

## 2016-03-15 ENCOUNTER — Emergency Department (HOSPITAL_BASED_OUTPATIENT_CLINIC_OR_DEPARTMENT_OTHER): Payer: Medicaid Other

## 2016-03-15 DIAGNOSIS — E43 Unspecified severe protein-calorie malnutrition: Secondary | ICD-10-CM | POA: Diagnosis present

## 2016-03-15 DIAGNOSIS — R64 Cachexia: Secondary | ICD-10-CM | POA: Diagnosis present

## 2016-03-15 DIAGNOSIS — Z9114 Patient's other noncompliance with medication regimen: Secondary | ICD-10-CM

## 2016-03-15 DIAGNOSIS — I2690 Septic pulmonary embolism without acute cor pulmonale: Secondary | ICD-10-CM | POA: Diagnosis present

## 2016-03-15 DIAGNOSIS — Y95 Nosocomial condition: Secondary | ICD-10-CM | POA: Diagnosis present

## 2016-03-15 DIAGNOSIS — A419 Sepsis, unspecified organism: Principal | ICD-10-CM | POA: Diagnosis present

## 2016-03-15 DIAGNOSIS — F419 Anxiety disorder, unspecified: Secondary | ICD-10-CM | POA: Diagnosis present

## 2016-03-15 DIAGNOSIS — Z681 Body mass index (BMI) 19 or less, adult: Secondary | ICD-10-CM | POA: Diagnosis not present

## 2016-03-15 DIAGNOSIS — D509 Iron deficiency anemia, unspecified: Secondary | ICD-10-CM | POA: Diagnosis present

## 2016-03-15 DIAGNOSIS — F1110 Opioid abuse, uncomplicated: Secondary | ICD-10-CM | POA: Diagnosis present

## 2016-03-15 DIAGNOSIS — E876 Hypokalemia: Secondary | ICD-10-CM | POA: Diagnosis present

## 2016-03-15 DIAGNOSIS — R7881 Bacteremia: Secondary | ICD-10-CM

## 2016-03-15 DIAGNOSIS — J189 Pneumonia, unspecified organism: Secondary | ICD-10-CM

## 2016-03-15 DIAGNOSIS — Z590 Homelessness: Secondary | ICD-10-CM | POA: Diagnosis not present

## 2016-03-15 DIAGNOSIS — Z9119 Patient's noncompliance with other medical treatment and regimen: Secondary | ICD-10-CM | POA: Diagnosis not present

## 2016-03-15 DIAGNOSIS — R0600 Dyspnea, unspecified: Secondary | ICD-10-CM | POA: Diagnosis not present

## 2016-03-15 DIAGNOSIS — A4153 Sepsis due to Serratia: Secondary | ICD-10-CM | POA: Diagnosis present

## 2016-03-15 DIAGNOSIS — D649 Anemia, unspecified: Secondary | ICD-10-CM | POA: Diagnosis present

## 2016-03-15 DIAGNOSIS — F1910 Other psychoactive substance abuse, uncomplicated: Secondary | ICD-10-CM | POA: Diagnosis present

## 2016-03-15 LAB — URINALYSIS, ROUTINE W REFLEX MICROSCOPIC
BILIRUBIN URINE: NEGATIVE
BILIRUBIN URINE: NEGATIVE
GLUCOSE, UA: NEGATIVE mg/dL
Glucose, UA: NEGATIVE mg/dL
KETONES UR: NEGATIVE mg/dL
Ketones, ur: NEGATIVE mg/dL
LEUKOCYTES UA: NEGATIVE
Leukocytes, UA: NEGATIVE
NITRITE: NEGATIVE
NITRITE: NEGATIVE
PH: 7 (ref 5.0–8.0)
Protein, ur: 30 mg/dL — AB
Protein, ur: NEGATIVE mg/dL
SPECIFIC GRAVITY, URINE: 1.012 (ref 1.005–1.030)
Specific Gravity, Urine: 1.014 (ref 1.005–1.030)
pH: 7 (ref 5.0–8.0)

## 2016-03-15 LAB — COMPREHENSIVE METABOLIC PANEL
ALBUMIN: 2.2 g/dL — AB (ref 3.5–5.0)
ALK PHOS: 84 U/L (ref 38–126)
ALT: 11 U/L — AB (ref 14–54)
AST: 23 U/L (ref 15–41)
Anion gap: 9 (ref 5–15)
BILIRUBIN TOTAL: 0.5 mg/dL (ref 0.3–1.2)
BUN: 11 mg/dL (ref 6–20)
CALCIUM: 8 mg/dL — AB (ref 8.9–10.3)
CO2: 24 mmol/L (ref 22–32)
CREATININE: 0.54 mg/dL (ref 0.44–1.00)
Chloride: 103 mmol/L (ref 101–111)
GFR calc Af Amer: >60 mL/min (ref >60)
GFR calc non Af Amer: >60 mL/min (ref >60)
GLUCOSE: 184 mg/dL — AB (ref 65–99)
Potassium: 2.9 mmol/L — ABNORMAL LOW (ref 3.5–5.1)
Sodium: 136 mmol/L (ref 135–145)
TOTAL PROTEIN: 7.4 g/dL (ref 6.5–8.1)

## 2016-03-15 LAB — FOLATE: FOLATE: 9 ng/mL (ref >5.9)

## 2016-03-15 LAB — FERRITIN: FERRITIN: 279 ng/mL (ref 11–307)

## 2016-03-15 LAB — SALICYLATE LEVEL: Salicylate Lvl: <7 mg/dL (ref 2.8–30.0)

## 2016-03-15 LAB — CBC WITH DIFFERENTIAL/PLATELET
BASOS ABS: 0 10*3/uL (ref 0.0–0.1)
Basophils Relative: 0 %
EOS ABS: 0 10*3/uL (ref 0.0–0.7)
Eosinophils Relative: 0 %
HEMATOCRIT: 19.6 % — AB (ref 36.0–46.0)
Hemoglobin: 5.7 g/dL — CL (ref 12.0–15.0)
LYMPHS ABS: 0.9 10*3/uL (ref 0.7–4.0)
Lymphocytes Relative: 10 %
MCH: 23.1 pg — ABNORMAL LOW (ref 26.0–34.0)
MCHC: 29.1 g/dL — AB (ref 30.0–36.0)
MCV: 79.4 fL (ref 78.0–100.0)
MONOS PCT: 5 %
Monocytes Absolute: 0.5 10*3/uL (ref 0.1–1.0)
NEUTROS ABS: 7.8 10*3/uL — AB (ref 1.7–7.7)
Neutrophils Relative %: 85 %
Platelets: 246 10*3/uL (ref 150–400)
RBC: 2.47 MIL/uL — ABNORMAL LOW (ref 3.87–5.11)
RDW: 17.9 % — AB (ref 11.5–15.5)
WBC: 9.2 10*3/uL (ref 4.0–10.5)

## 2016-03-15 LAB — BLOOD CULTURE ID PANEL (REFLEXED)
Acinetobacter baumannii: NOT DETECTED
CANDIDA ALBICANS: NOT DETECTED
CANDIDA TROPICALIS: NOT DETECTED
Candida glabrata: NOT DETECTED
Candida krusei: NOT DETECTED
Candida parapsilosis: NOT DETECTED
Carbapenem resistance: NOT DETECTED
ENTEROBACTER CLOACAE COMPLEX: NOT DETECTED
ENTEROCOCCUS SPECIES: NOT DETECTED
Enterobacteriaceae species: DETECTED — AB
Escherichia coli: NOT DETECTED
HAEMOPHILUS INFLUENZAE: NOT DETECTED
Klebsiella oxytoca: NOT DETECTED
Klebsiella pneumoniae: NOT DETECTED
LISTERIA MONOCYTOGENES: NOT DETECTED
NEISSERIA MENINGITIDIS: NOT DETECTED
PROTEUS SPECIES: NOT DETECTED
Pseudomonas aeruginosa: NOT DETECTED
SERRATIA MARCESCENS: DETECTED — AB
STAPHYLOCOCCUS SPECIES: NOT DETECTED
STREPTOCOCCUS SPECIES: NOT DETECTED
Staphylococcus aureus (BCID): NOT DETECTED
Streptococcus agalactiae: NOT DETECTED
Streptococcus pneumoniae: NOT DETECTED
Streptococcus pyogenes: NOT DETECTED

## 2016-03-15 LAB — RAPID URINE DRUG SCREEN, HOSP PERFORMED
AMPHETAMINES: NOT DETECTED
Amphetamines: NOT DETECTED
BARBITURATES: NOT DETECTED
Barbiturates: NOT DETECTED
Benzodiazepines: NOT DETECTED
Benzodiazepines: POSITIVE — AB
COCAINE: POSITIVE — AB
COCAINE: POSITIVE — AB
OPIATES: POSITIVE — AB
OPIATES: POSITIVE — AB
Tetrahydrocannabinol: NOT DETECTED
Tetrahydrocannabinol: NOT DETECTED

## 2016-03-15 LAB — ACETAMINOPHEN LEVEL

## 2016-03-15 LAB — URINALYSIS, MICROSCOPIC (REFLEX)

## 2016-03-15 LAB — RETICULOCYTES
RBC.: 2.46 MIL/uL — AB (ref 3.87–5.11)
Retic Count, Absolute: 46.7 10*3/uL (ref 19.0–186.0)
Retic Ct Pct: 1.9 % (ref 0.4–3.1)

## 2016-03-15 LAB — PREGNANCY, URINE
Preg Test, Ur: NEGATIVE
Preg Test, Ur: NEGATIVE

## 2016-03-15 LAB — I-STAT CG4 LACTIC ACID, ED
Lactic Acid, Venous: 2.28 mmol/L (ref 0.5–1.9)
Lactic Acid, Venous: 1.16 mmol/L (ref 0.5–1.9)

## 2016-03-15 LAB — MRSA PCR SCREENING: MRSA by PCR: POSITIVE — AB

## 2016-03-15 LAB — VITAMIN B12: Vitamin B-12: 234 pg/mL (ref 180–914)

## 2016-03-15 LAB — ETHANOL: Alcohol, Ethyl (B): <5 mg/dL (ref <5)

## 2016-03-15 LAB — IRON AND TIBC
Iron: 6 ug/dL — ABNORMAL LOW (ref 28–170)
SATURATION RATIOS: 3 % — AB (ref 10.4–31.8)
TIBC: 176 ug/dL — ABNORMAL LOW (ref 250–450)
UIBC: 170 ug/dL

## 2016-03-15 LAB — TROPONIN I: Troponin I: <0.03 ng/mL (ref <0.03)

## 2016-03-15 MED ORDER — SODIUM CHLORIDE 0.9 % IV SOLN: Freq: Once | INTRAVENOUS | Status: DC

## 2016-03-15 MED ORDER — SODIUM CHLORIDE 0.9% FLUSH: 3.0000 mL | Freq: Two times a day (BID) | INTRAVENOUS | Status: DC

## 2016-03-15 MED ORDER — SODIUM CHLORIDE 0.9 % IV SOLN
INTRAVENOUS | Status: DC
  Administered 2016-03-15: 17:00:00 via INTRAVENOUS

## 2016-03-15 MED ORDER — KETOROLAC TROMETHAMINE 30 MG/ML IJ SOLN
30.0000 mg | Freq: Four times a day (QID) | INTRAMUSCULAR | Status: DC | PRN
  Administered 2016-03-15: 30 mg via INTRAVENOUS
  Filled 2016-03-15: qty 1

## 2016-03-15 MED ORDER — CLONIDINE HCL 0.1 MG PO TABS: 0.1000 mg | ORAL_TABLET | Freq: Every day | ORAL | Status: DC

## 2016-03-15 MED ORDER — SODIUM CHLORIDE 0.9 % IV BOLUS (SEPSIS)
1000.0000 mL | Freq: Once | INTRAVENOUS | Status: AC
  Administered 2016-03-15: 1000 mL via INTRAVENOUS

## 2016-03-15 MED ORDER — POTASSIUM CHLORIDE CRYS ER 20 MEQ PO TBCR
40.0000 meq | EXTENDED_RELEASE_TABLET | Freq: Once | ORAL | Status: AC
  Administered 2016-03-15: 40 meq via ORAL
  Filled 2016-03-15: qty 2

## 2016-03-15 MED ORDER — CLONIDINE HCL 0.1 MG PO TABS: 0.1000 mg | ORAL_TABLET | ORAL | Status: DC

## 2016-03-15 MED ORDER — ONDANSETRON 4 MG PO TBDP: 4.0000 mg | ORAL_TABLET | Freq: Four times a day (QID) | ORAL | Status: DC | PRN

## 2016-03-15 MED ORDER — CLONIDINE HCL 0.1 MG PO TABS
0.1000 mg | ORAL_TABLET | Freq: Four times a day (QID) | ORAL | Status: DC
  Administered 2016-03-15: 0.1 mg via ORAL
  Filled 2016-03-15: qty 1

## 2016-03-15 MED ORDER — LOPERAMIDE HCL 2 MG PO CAPS: 2.0000 mg | ORAL_CAPSULE | ORAL | Status: DC | PRN

## 2016-03-15 MED ORDER — LORAZEPAM 2 MG/ML IJ SOLN
1.0000 mg | Freq: Once | INTRAMUSCULAR | Status: AC
  Administered 2016-03-15: 1 mg via INTRAVENOUS
  Filled 2016-03-15: qty 1

## 2016-03-15 MED ORDER — DEXTROSE 5 % IV SOLN
2.0000 g | Freq: Once | INTRAVENOUS | Status: AC
  Administered 2016-03-15: 2 g via INTRAVENOUS

## 2016-03-15 MED ORDER — HYDROMORPHONE HCL 1 MG/ML IJ SOLN
1.0000 mg | Freq: Once | INTRAMUSCULAR | Status: AC
  Administered 2016-03-15: 1 mg via INTRAVENOUS
  Filled 2016-03-15: qty 1

## 2016-03-15 MED ORDER — CEFEPIME HCL 1 G IJ SOLR
1.0000 g | INTRAMUSCULAR | Status: DC
  Filled 2016-03-15: qty 1

## 2016-03-15 MED ORDER — TRAMADOL HCL 50 MG PO TABS
100.0000 mg | ORAL_TABLET | Freq: Four times a day (QID) | ORAL | Status: DC | PRN
  Administered 2016-03-15: 100 mg via ORAL
  Filled 2016-03-15: qty 2

## 2016-03-15 MED ORDER — POTASSIUM CHLORIDE 10 MEQ/100ML IV SOLN
10.0000 meq | INTRAVENOUS | Status: DC
  Administered 2016-03-15: 10 meq via INTRAVENOUS
  Filled 2016-03-15: qty 100

## 2016-03-15 MED ORDER — FERROUS SULFATE 325 (65 FE) MG PO TABS: 325.0000 mg | ORAL_TABLET | Freq: Two times a day (BID) | ORAL | Status: DC

## 2016-03-15 MED ORDER — DICYCLOMINE HCL 20 MG PO TABS
20.0000 mg | ORAL_TABLET | Freq: Four times a day (QID) | ORAL | Status: DC | PRN
  Filled 2016-03-15: qty 1

## 2016-03-15 MED ORDER — HYDROXYZINE HCL 25 MG PO TABS: 25.0000 mg | ORAL_TABLET | Freq: Four times a day (QID) | ORAL | Status: DC | PRN

## 2016-03-15 MED ORDER — SODIUM CHLORIDE 0.9 % IV SOLN
500.0000 mg | Freq: Two times a day (BID) | INTRAVENOUS | Status: DC
  Filled 2016-03-15: qty 500

## 2016-03-15 MED ORDER — SODIUM CHLORIDE 0.9 % IV BOLUS (SEPSIS)
500.0000 mL | Freq: Once | INTRAVENOUS | Status: AC
  Administered 2016-03-15: 500 mL via INTRAVENOUS

## 2016-03-15 MED ORDER — SODIUM CHLORIDE 0.9 % IV SOLN: INTRAVENOUS | Status: DC

## 2016-03-15 MED ORDER — DEXTROSE 5 % IV SOLN
1.0000 g | Freq: Three times a day (TID) | INTRAVENOUS | Status: DC
  Filled 2016-03-15: qty 1

## 2016-03-15 MED ORDER — CEFEPIME HCL 2 G IJ SOLR
INTRAMUSCULAR | Status: AC
  Filled 2016-03-15: qty 2

## 2016-03-15 NOTE — ED Notes (Signed)
Lactic Acid reported to Dr.Yao 

## 2016-03-15 NOTE — ED Notes (Signed)
Attempted iv access x x 2.  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

## 2016-03-15 NOTE — ED Provider Notes (Signed)
[facility]   CSN: [id number] Arrival date & time: 03/15/16  0857     History   Chief Complaint Chief Complaint  Patient presents with  . Shortness of Breath    HPI Catherine Hernandez is a 30 y.o. female hx of heroin abuse, endocarditis, recurrent pneumonia, Here presenting with shortness of breath, anemia. Patient states that she has been using heroin but last time was several weeks ago. Patient has been having shortness of breath for the last several days and subjective chills. Patient went to [facility] yesterday and was admitted for anemia, pneumonia, possible endocarditis. Patient signed out AGAINST MEDICAL ADVICE. On review of her medical chart, patient already had positive blood cultures with serratia and enterobacter. Patient called EMS for worsening SOB.   The history is provided by the patient.    Past Medical History:  Diagnosis Date  . Anemia   . CAP (community acquired pneumonia) 11/16/2015  . Childhood asthma   . Enterococcal bacteremia   . Heroin abuse   . Hyponatremia   . Pneumonia 2007  . Polysubstance abuse     Patient Active Problem List   Diagnosis Date Noted  . HCAP (healthcare-associated pneumonia) 03/15/2016  . Community acquired pneumonia 03/14/2016  . Heroin abuse 03/14/2016  . Symptomatic anemia 03/14/2016  . Leukocytosis 03/14/2016  . Multifocal pneumonia 03/14/2016  . Enterococcal sepsis (HCC) 12/19/2015  . Palliative care encounter   . Hypokalemia   . Noncompliance with medication regimen   . Altered mental status 12/16/2015  . Ankle sprain 12/16/2015  . 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 Tachypnea   . Dyspnea   . 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 Polysubstance abuse   . Enterococcal bacteremia 11/18/2015  . Staphylococcus aureus bacteremia 11/18/2015  . Normocytic anemia 11/18/2015  . Protein-calorie malnutrition (HCC) 11/18/2015  . Poor dentition 11/18/2015  . Malnutrition of  moderate degree 11/18/2015  . AKI (acute kidney injury) (HCC)   . Opioid use disorder, severe, dependence (HCC) 11/17/2015  . Opioid use with withdrawal (HCC) 11/17/2015  . Hyponatremia 11/17/2015  . Acute renal failure (HCC) 11/17/2015  . Hypocalcemia 11/17/2015  . Nonspecific abnormal electrocardiogram (ECG) (EKG) 11/17/2015  . Thrombocytopenia (HCC) 11/17/2015  . Sepsis (HCC) 11/16/2015    Past Surgical History:  Procedure Laterality Date  . NO PAST SURGERIES    . TEE WITHOUT CARDIOVERSION N/A 11/23/2015   Procedure: TRANSESOPHAGEAL ECHOCARDIOGRAM (TEE) WITH ANESTHESIA;  Surgeon: Quintella Reichert, MD;  Location: [facility];  Service: Cardiovascular;  Laterality: N/A;    OB History    Gravida Para Term Preterm AB Living   1 1 1  0 0 1   SAB TAB Ectopic Multiple Live Births   0 0 0 0 1       Home Medications    Prior to Admission medications   Medication Sig Start Date End Date Taking? Authorizing Provider  amoxicillin-clavulanate (AUGMENTIN) 875-125 MG tablet Take 1 tablet by mouth 2 (two) times daily. Patient not taking: Reported on 03/14/2016 12/19/15   Catarina Hartshorn, MD  feeding supplement (BOOST / RESOURCE BREEZE) LIQD Take 1 Container by mouth 2 (two) times daily between meals. Patient not taking: Reported on 03/14/2016 12/02/15   Clydia Llano, MD    Family History Family History  Problem Relation Age of Onset  . Anesthesia problems Neg Hx   . Hypotension Neg Hx   . Malignant hyperthermia Neg Hx   . Pseudochol deficiency Neg Hx  Social History Social History  Substance Use Topics  . Smoking status: Current Every Day Smoker    Packs/day: 0.50    Years: 10.00    Types: Cigarettes  . Smokeless tobacco: Never Used  . Alcohol use No     Allergies   Patient has no known allergies.   Review of Systems Review of Systems  Respiratory: Positive for shortness of breath.   All other systems reviewed and are negative.    Physical Exam Updated Vital  Signs BP 141/59   Pulse 111   Temp 98.4 F (36.9 C) (Oral)   Resp (!) 31   SpO2 100%   Physical Exam  Constitutional: She is oriented to person, place, and time.  Chronically ill   HENT:  Head: Normocephalic.  MM slightly dry   Eyes: EOM are normal. Pupils are equal, round, and reactive to light.  Neck: Normal range of motion. Neck supple.  Cardiovascular: Regular rhythm.   Tachycardic   Pulmonary/Chest:  Slightly tachypneic, crackles bilateral bases   Abdominal: Soft. Bowel sounds are normal. She exhibits no distension. There is no tenderness.  Musculoskeletal:  Multiple track marks on bilateral arms, no obvious cellulitis.   Neurological: She is alert and oriented to person, place, and time. No cranial nerve deficit. Coordination normal.  Skin: Skin is warm.  Psychiatric: She has a normal mood and affect.  Nursing note and vitals reviewed.    ED Treatments / Results  Labs (all labs ordered are listed, but only abnormal results are displayed) Labs Reviewed  COMPREHENSIVE METABOLIC PANEL - Abnormal; Notable for the following:       Result Value   Potassium 2.9 (*)    Glucose, Bld 184 (*)    Calcium 8.0 (*)    Albumin 2.2 (*)    ALT 11 (*)    All other components within normal limits  CBC WITH DIFFERENTIAL/PLATELET - Abnormal; Notable for the following:    RBC 2.47 (*)    Hemoglobin 5.7 (*)    HCT 19.6 (*)    MCH 23.1 (*)    MCHC 29.1 (*)    RDW 17.9 (*)    Neutro Abs 7.8 (*)    All other components within normal limits  ACETAMINOPHEN LEVEL - Abnormal; Notable for the following:    Acetaminophen (Tylenol), Serum <10 (*)    All other components within normal limits  I-STAT CG4 LACTIC ACID, ED - Abnormal; Notable for the following:    Lactic Acid, Venous 2.28 (*)    All other components within normal limits  CULTURE, BLOOD (ROUTINE X 2)  CULTURE, BLOOD (ROUTINE X 2)  URINE CULTURE  TROPONIN I  ETHANOL  SALICYLATE LEVEL  URINALYSIS, ROUTINE W REFLEX  MICROSCOPIC  PREGNANCY, URINE  RAPID URINE DRUG SCREEN, HOSP PERFORMED  VITAMIN B12  FOLATE  IRON AND TIBC  FERRITIN  RETICULOCYTES  I-STAT CG4 LACTIC ACID, ED    EKG  EKG Interpretation  Date/Time:  Wednesday March 15 2016 09:04:56 EST Ventricular Rate:  107 PR Interval:    QRS Duration: 89 QT Interval:  362 QTC Calculation: 483 R Axis:   96 Text Interpretation:  Sinus tachycardia Borderline right axis deviation Borderline prolonged QT interval No significant change since last tracing Confirmed by Shaney Deckman  MD, Haly Feher ([id number]) on 03/15/2016 9:13:30 AM       Radiology Dg Chest 2 View  Result Date: 03/15/2016 CLINICAL DATA:  Pneumonia, shortness of breath. EXAM: CHEST  2 VIEW COMPARISON:  Chest x-ray 03/14/2016. FINDINGS:  Bilateral multifocal cavitary areas are again noted and unchanged since prior study. Small left pleural effusion. Heart is normal size. No acute bony abnormality. IMPRESSION: Stable multifocal cavitary pulmonary airspace opacities, most likely multifocal cavitary pneumonia. Septic emboli and malignancy are possible and not excluded. Electronically Signed   By: Charlett NoseKevin  Dover M.D.   On: 03/15/2016 10:31   Dg Chest 2 View  Result Date: 03/14/2016 CLINICAL DATA:  Chest pain. EXAM: CHEST  2 VIEW COMPARISON:  12/16/2015. FINDINGS: Mediastinum hilar structures stable. Multifocal bilateral cavitary pulmonary lesions are noted. Multifocal cavitary pneumonia could present this fashion. Of malignancy would be less likely. Small left pleural effusion. No pneumothorax . Heart size normal. IMPRESSION: Multifocal cavitary pulmonary infiltrates most consistent with multifocal cavitary pneumonia. Chest CT may prove useful for further evaluation. These results will be called to the ordering clinician or representative by the Radiologist Assistant, and communication documented in the PACS or zVision Dashboard. Electronically Signed   By: Maisie Fushomas  Register   On: 03/14/2016 10:07     Procedures Procedures (including critical care time)  CRITICAL CARE Performed by: Richardean Canalavid H Dulce Martian   Total critical care time: 30  minutes  Critical care time was exclusive of separately billable procedures and treating other patients.  Critical care was necessary to treat or prevent imminent or life-threatening deterioration.  Critical care was time spent personally by me on the following activities: development of treatment plan with patient and/or surrogate as well as nursing, discussions with consultants, evaluation of patient's response to treatment, examination of patient, obtaining history from patient or surrogate, ordering and performing treatments and interventions, ordering and review of laboratory studies, ordering and review of radiographic studies, pulse oximetry and re-evaluation of patient's condition.  Angiocath insertion Performed by: Richardean Canalavid H Pershing [place]  Consent: Verbal consent obtained. Risks and benefits: risks, benefits and alternatives were discussed Time out: Immediately prior to procedure a "time out" was called to verify the correct patient, procedure, equipment, support staff and site/side marked as required.  Preparation: Patient was prepped and draped in the usual sterile fashion.  Vein Location: R antecube  Ultrasound Guided  Gauge: 20 short   Normal blood return and flush without difficulty Patient tolerance: Patient tolerated the procedure well with no immediate complications.  Angiocath insertion Performed by: Richardean Canalavid H Sidrah Harden  Consent: Verbal consent obtained. Risks and benefits: risks, benefits and alternatives were discussed Time out: Immediately prior to procedure a "time out" was called to verify the correct patient, procedure, equipment, support staff and site/side marked as required.  Preparation: Patient was prepped and draped in the usual sterile fashion.  Vein Location: L antecube  Ultrasound Guided  Gauge: 20 long   Normal blood return  and flush without difficulty Patient tolerance: Patient tolerated the procedure well with no immediate complications.      Medications Ordered in ED Medications  ceFEPIme (MAXIPIME) 2 g injection (  Not Given 03/15/16 1138)  potassium chloride 10 mEq in 100 mL IVPB (not administered)  potassium chloride SA (K-DUR,KLOR-CON) CR tablet 40 mEq (not administered)  HYDROmorphone (DILAUDID) injection 1 mg (not administered)  LORazepam (ATIVAN) injection 1 mg (not administered)  sodium chloride 0.9 % bolus 1,000 mL (1,000 mLs Intravenous New Bag/Given 03/15/16 0950)    And  sodium chloride 0.9 % bolus 500 mL (0 mLs Intravenous Stopped 03/15/16 1129)  ceFEPIme (MAXIPIME) 2 g in dextrose 5 % 50 mL IVPB (0 g Intravenous Stopped 03/15/16 1125)     Initial Impression / Assessment and Plan /  ED Course  I have reviewed the triage vital signs and the nursing notes.  Pertinent labs & imaging results that were available during my care of the patient were reviewed by me and considered in my medical decision making (see chart for details).  Clinical Course     Margretta Dittyngela N Coatney is a 30 y.o. female here with SOB. Patient hypothermic and tachycardic. Meets sepsis criteria. Blood culture from yesterday + serratic and enterobacter. Will give 30 cc/kg bolus. Will repeat sepsis workup. I discussed with pharmacy, who recommend cefepime. Will likely need transfusion but there is no blood at the free standing ED. Will likely need transfusion when she gets admitted.   11:30 am Hg was 5.7. Patient will need 2 U PRBC transfusion. However, [facility] Talked to Dr. Mal MistyNetty, who request anemia panel. K 2.9, supplemented. Patient's R antecube IV infiltrated. Will place L antecubical IV. WBC nl. Lactate 2.3. Hospitalist wants us to recheck temp off bair hugger and admit to tele if patient is not hypothermic off bair hugger.   12:04 PM Temp stable off bair hugger.  [facility] to tele [facility], bacteremia, symptomatic anemia.    Final Clinical Impressions(s) / ED Diagnoses   Final diagnoses:  HCAP (healthcare-associated pneumonia)  Anemia, unspecified type  Bacteremia    New Prescriptions New Prescriptions   No medications on file     Charlynne Panderavid Hsienta Navie Lamoreaux, MD 03/15/16 1205

## 2016-03-15 NOTE — ED Notes (Signed)
Pt placed on oxygen at 2L per [place] due to low hemoglobin

## 2016-03-15 NOTE — Progress Notes (Signed)
Patient has decided to sign out AMA.  Patient acknowledged the risks of leaving, but has stated that she is going to [facility].  Patient advised that the treatment plan at [facility] would most likely be the same.  Patient insistent on get IV pain medicine, and ativan for withdrawal from heroine, and cocaine.  Patient given tramadol, toradol, and clonidine, but was not happy with those medications.  Patient had previously received dilaudid, and ativan at [facility], and was under the impression that she would be receiving those medications while she was admitted.  When those expectations where not met she decided to leave.  Patient found with IV pulled out, bleeding.  Bleeding was stopped, and gauze applied.

## 2016-03-15 NOTE — Progress Notes (Signed)
Patient admitted from [facility] for PNA.  Patient noted to have scabs all over body, cachetic, very malnourished.  Patient noted to have 2 semi healed abrasions to forehead under hair.  Patient also noted to have a previous iv site on right arm, that was weaping.

## 2016-03-15 NOTE — Plan of Care (Signed)
Patient is a 30 year old female with history of endocarditis, enterococcal bacteremia, IV drug use that presented with dyspnea secondary to anemia. She was supposed be admitted yesterday but left AGAINST MEDICAL ADVICE. She presents today with continued dyspnea and was found to have a hemoglobin of 5.7. She was restarted on cefepime for enterococcus bacteremia. Blood cultures were obtained. She was accepted to medical floor, inpatient status. She will require a type and screen in addition to blood transfusion on arrival.

## 2016-03-15 NOTE — ED Triage Notes (Signed)
Patient left [facility] yesterday AMA and had been admitted for pneumonia. C/O SOB

## 2016-03-15 NOTE — ED Notes (Signed)
Attempted to call report nurse not available

## 2016-03-15 NOTE — H&P (Signed)
History and Physical   SHAHED YEOMAN WJX:[id number] DOB: 05-Sep-1985 DOA: 03/15/2016  Referring MD/NP/PA: Dr. Silverio Lay, EDP PCP: No PCP Per Patient Outpatient Specialists: None  Patient coming from: Homeless  Chief Complaint: Dyspnea  HPI: Catherine Hernandez is a 30 y.o. female with history of recent endocarditis and pneumonia, as well as heroin addiction and current homelessness presented to [facility] 12/13 with worsening dyspnea, productive cough, weakness, and myalgias. She endorses pain in ribs with deep inspiration and coughing that has worsened over the past 2-3 weeks. She was evaluated 12/12 for same complaints, brought while in custody of [employer] for panhandling, found to have pneumonia and anemia, and was admitted but left AMA prior to treatment. Symptoms worsened prompting return for care. She did not complete the course of antibiotics for recent pneumonia and endocarditis.   ED Course: On arrival she was septic with rectal temp 96.31F, RR 30/min, HR 107bpm, BP 107/65, SpO2 100% on room air. Potassium was low and repleted. Lactate 2.28. UDS +cocaine, benzodiazepines, and opioids. UPT negative. Blood cultures from 12/12 had grown Serratia. She was given 30cc/kg IVF and empiric cefepime. Lactate down to 1.16. She was then transferred to [facility] for further treatment including transfusion for hgb 5.7.  Review of Systems: No fevers, no rectal bleeding, melena, N/V/D, hemoptysis, and per HPI. All others reviewed and are negative.   Past Medical History:  Diagnosis Date  . Anemia   . CAP (community acquired pneumonia) 11/16/2015  . Childhood asthma   . Enterococcal bacteremia   . Heroin abuse   . Hyponatremia   . Pneumonia 2007  . Polysubstance abuse     Past Surgical History:  Procedure Laterality Date  . NO PAST SURGERIES    . TEE WITHOUT CARDIOVERSION N/A 11/23/2015   Procedure: TRANSESOPHAGEAL ECHOCARDIOGRAM (TEE) WITH ANESTHESIA;  Surgeon: Quintella Reichert, MD;  Location: [facility];  Service:  Cardiovascular;  Laterality: N/A;    - At least 5 pack year history of cigarettes. No recent EtOH, denies using heroin or cocaine recently. Homeless, living with boyfriend.   No Known Allergies  Family History  Problem Relation Age of Onset  . Anesthesia problems Neg Hx   . Hypotension Neg Hx   . Malignant hyperthermia Neg Hx   . Pseudochol deficiency Neg Hx    - Family history otherwise reviewed and not pertinent.  Prior to Admission medications   Medication Sig Start Date End Date Taking? Authorizing Provider  amoxicillin-clavulanate (AUGMENTIN) 875-125 MG tablet Take 1 tablet by mouth 2 (two) times daily. Patient not taking: Reported on 03/14/2016 12/19/15   Catherine Hartshorn, MD  feeding supplement (BOOST / RESOURCE BREEZE) LIQD Take 1 Container by mouth 2 (two) times daily between meals. Patient not taking: Reported on 03/14/2016 12/02/15   Clydia Llano, MD    Physical Exam: Vitals:   03/15/16 1200 03/15/16 1230 03/15/16 1325 03/15/16 1451  BP:   143/61 104/67  Pulse:   110 (!) 116  Resp:   (!) 30 (!) 33  Temp: 98.7 F (37.1 C) 99.1 F (37.3 C) 99 F (37.2 C) 97.7 F (36.5 C)  TempSrc: Oral Oral  Oral  SpO2:    100%   Constitutional: Ill-appearing 30 y.o. female in no distress, anxious demeanor Eyes: Lids and conjunctivae normal, PERRL.  ENMT: Mucous membranes are moist. Posterior pharynx clear of any exudate or lesions. Very poor dentition.  Neck: normal, no masses, no thyromegaly Respiratory: Non-labored breathing room air without accessory muscle use. Clear, decreased L>R  breath sounds to auscultation bilaterally. Cardiovascular: Tachycardic rate, normal rhythm, II/VI early systolic murmur at left sternal border, no rubs, or gallops. No carotid bruits. No JVD. No LE edema. 2+ pedal pulses. Abdomen: Normoactive bowel sounds. No tenderness, non-distended, and no masses palpated. No hepatosplenomegaly. GU: No indwelling catheter Musculoskeletal: No clubbing / cyanosis. No  joint deformity upper and lower extremities. Good ROM, no contractures. Diffusely cachectic with diminished muscle bulk. No midline spinal tenderness. Skin: Warm, dry. 2 shallow hemostatic abrasions behind hair line on forehead. PIV in Left AC intact. Right IV site weeping without erythema. No splinter hemorrhages. Neurologic: CN II-XII grossly intact. Gait slow. Speech normal. No focal deficits in motor strength or sensation in all extremities. DTRs 2+.  Psychiatric: Alert and oriented x3. Impaired judgment and poor insight, perseverating on pain medications. Mood anxious with congruent affect.   Labs on Admission: I have personally reviewed following labs and imaging studies  CBC:  Recent Labs Lab 03/14/16 1005 03/15/16 0935  WBC 11.6* 9.2  NEUTROABS 9.1* 7.8*  HGB 5.7* 5.7*  HCT 18.7* 19.6*  MCV 76.6* 79.4  PLT 241 246   Basic Metabolic Panel:  Recent Labs Lab 03/14/16 1005 03/15/16 0935  NA 133* 136  K 2.9* 2.9*  CL 96* 103  CO2 29 24  GLUCOSE 95 184*  BUN 12 11  CREATININE 0.54 0.54  CALCIUM 7.6* 8.0*   GFR: CrCl cannot be calculated (Unknown ideal weight.). Liver Function Tests:  Recent Labs Lab 03/14/16 1005 03/15/16 0935  AST 21 23  ALT 10* 11*  ALKPHOS 89 84  BILITOT 0.7 0.5  PROT 7.4 7.4  ALBUMIN 2.4* 2.2*   No results for input(s): LIPASE, AMYLASE in the last 168 hours. No results for input(s): AMMONIA in the last 168 hours. Coagulation Profile: No results for input(s): INR, PROTIME in the last 168 hours. Cardiac Enzymes:  Recent Labs Lab 03/15/16 0935  TROPONINI <0.03   BNP (last 3 results) No results for input(s): PROBNP in the last 8760 hours. HbA1C: No results for input(s): HGBA1C in the last 72 hours. CBG: No results for input(s): GLUCAP in the last 168 hours. Lipid Profile: No results for input(s): CHOL, HDL, LDLCALC, TRIG, CHOLHDL, LDLDIRECT in the last 72 hours. Thyroid Function Tests: No results for input(s): TSH, T4TOTAL,  FREET4, T3FREE, THYROIDAB in the last 72 hours. Anemia Panel:  Recent Labs  03/15/16 0934 03/15/16 0938  VITAMINB12  --  234  FOLATE  --  9.0  FERRITIN  --  279  TIBC  --  176*  IRON  --  6*  RETICCTPCT 1.9  --    Urine analysis:    Component Value Date/Time   COLORURINE YELLOW 03/15/2016 1300   COLORURINE YELLOW 03/15/2016 1300   APPEARANCEUR CLEAR 03/15/2016 1300   APPEARANCEUR CLEAR 03/15/2016 1300   LABSPEC 1.014 03/15/2016 1300   LABSPEC 1.012 03/15/2016 1300   PHURINE 7.0 03/15/2016 1300   PHURINE 7.0 03/15/2016 1300   GLUCOSEU NEGATIVE 03/15/2016 1300   GLUCOSEU NEGATIVE 03/15/2016 1300   HGBUR LARGE (A) 03/15/2016 1300   HGBUR LARGE (A) 03/15/2016 1300   BILIRUBINUR NEGATIVE 03/15/2016 1300   BILIRUBINUR NEGATIVE 03/15/2016 1300   KETONESUR NEGATIVE 03/15/2016 1300   KETONESUR NEGATIVE 03/15/2016 1300   PROTEINUR 30 (A) 03/15/2016 1300   PROTEINUR NEGATIVE 03/15/2016 1300   UROBILINOGEN 0.2 01/07/2012 1140   NITRITE NEGATIVE 03/15/2016 1300   NITRITE NEGATIVE 03/15/2016 1300   LEUKOCYTESUR NEGATIVE 03/15/2016 1300   LEUKOCYTESUR NEGATIVE 03/15/2016 1300  Sepsis Labs: @LABRCNTIP (procalcitonin:4,lacticidven:4) ) Recent Results (from the past 240 hour(s))  Culture, blood (Routine X 2) w Reflex to ID Panel     Status: None (Preliminary result)   Collection Time: 03/14/16 10:05 AM  Result Value Ref Range Status   Specimen Description BLOOD RIGHT FOREARM  Final   Special Requests BOTTLES DRAWN AEROBIC AND ANAEROBIC 5ML  Final   Culture  Setup Time   Final    GRAM NEGATIVE RODS IN BOTH AEROBIC AND ANAEROBIC BOTTLES Organism ID to follow CRITICAL RESULT CALLED TO, READ BACK BY AND VERIFIED WITH: Kermit BaloL ADKINS RN [phone] 03/15/16 A BROWNING    Culture   Final    NO GROWTH 1 DAY Performed at [facility]    Report Status PENDING  Incomplete  Blood Culture ID Panel (Reflexed)     Status: Abnormal   Collection Time: 03/14/16 10:05 AM  Result Value Ref Range  Status   Enterococcus species NOT DETECTED NOT DETECTED Final   Listeria monocytogenes NOT DETECTED NOT DETECTED Final   Staphylococcus species NOT DETECTED NOT DETECTED Final   Staphylococcus aureus NOT DETECTED NOT DETECTED Final   Streptococcus species NOT DETECTED NOT DETECTED Final   Streptococcus agalactiae NOT DETECTED NOT DETECTED Final   Streptococcus pneumoniae NOT DETECTED NOT DETECTED Final   Streptococcus pyogenes NOT DETECTED NOT DETECTED Final   Acinetobacter baumannii NOT DETECTED NOT DETECTED Final   Enterobacteriaceae species DETECTED (A) NOT DETECTED Final    Comment: CRITICAL RESULT CALLED TO, READ BACK BY AND VERIFIED WITH: L ADKINS RN [phone] 03/15/16 A BROWNING    Enterobacter cloacae complex NOT DETECTED NOT DETECTED Final   Escherichia coli NOT DETECTED NOT DETECTED Final   Klebsiella oxytoca NOT DETECTED NOT DETECTED Final   Klebsiella pneumoniae NOT DETECTED NOT DETECTED Final   Proteus species NOT DETECTED NOT DETECTED Final   Serratia marcescens DETECTED (A) NOT DETECTED Final    Comment: CRITICAL RESULT CALLED TO, READ BACK BY AND VERIFIED WITH: L ADKINS RN [phone] 03/15/16 A BROWNING    Carbapenem resistance NOT DETECTED NOT DETECTED Final   Haemophilus influenzae NOT DETECTED NOT DETECTED Final   Neisseria meningitidis NOT DETECTED NOT DETECTED Final   Pseudomonas aeruginosa NOT DETECTED NOT DETECTED Final   Candida albicans NOT DETECTED NOT DETECTED Final   Candida glabrata NOT DETECTED NOT DETECTED Final   Candida krusei NOT DETECTED NOT DETECTED Final   Candida parapsilosis NOT DETECTED NOT DETECTED Final   Candida tropicalis NOT DETECTED NOT DETECTED Final    Comment: Performed at [facility]     Radiological Exams on Admission: Dg Chest 2 View  Result Date: 03/15/2016 CLINICAL DATA:  Pneumonia, shortness of breath. EXAM: CHEST  2 VIEW COMPARISON:  Chest x-ray 03/14/2016. FINDINGS: Bilateral multifocal cavitary areas are again noted and  unchanged since prior study. Small left pleural effusion. Heart is normal size. No acute bony abnormality. IMPRESSION: Stable multifocal cavitary pulmonary airspace opacities, most likely multifocal cavitary pneumonia. Septic emboli and malignancy are possible and not excluded. Electronically Signed   By: Charlett NoseKevin  Dover M.D.   On: 03/15/2016 10:31   Dg Chest 2 View  Result Date: 03/14/2016 CLINICAL DATA:  Chest pain. EXAM: CHEST  2 VIEW COMPARISON:  12/16/2015. FINDINGS: Mediastinum hilar structures stable. Multifocal bilateral cavitary pulmonary lesions are noted. Multifocal cavitary pneumonia could present this fashion. Of malignancy would be less likely. Small left pleural effusion. No pneumothorax . Heart size normal. IMPRESSION: Multifocal cavitary pulmonary infiltrates most consistent with multifocal cavitary  pneumonia. Chest CT may prove useful for further evaluation. These results will be called to the ordering clinician or representative by the Radiologist Assistant, and communication documented in the PACS or zVision Dashboard. Electronically Signed   By: Maisie Fus  Register   On: 03/14/2016 10:07    EKG: Independently reviewed. Sinus tachycardia, rate 107bpm, RAD; no conduction delay, no ischemic changes. Prolonged QTC at .   Assessment/Plan Active Problems:   HCAP (healthcare-associated pneumonia)   Serratia bacteremia: History of MSSA and Enterococcus faecalis on blood cultures in August and E. faecalis only in September. 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 - Abx as below, will require 6 weeks therapy - ID consulted, Dr. Luciana Axe - Repeat echocardiogram. Echo 12/18/2015 showed tricuspid valve vegetations decreased from 11/18/15, and moderate-severe regurgitation.  - Repeat blood cultures drawn 12/13  Sepsis due to multifocal pneumonia: in setting of bacteremia, suggestive of endocarditis with septic pulmonary emboli.  - Cefepime 12/13 >>    - Vancomycin 12/13 >>  - Follow up blood and sputum cultures - O2 prn (100% on room air) - IVF's, lactate cleared, will continue NS @100cc /hr which is above maintenance rate.  - Monitor leukocytosis and vital signs.  - Recent HIV and RPR negative.  Symptomatic anemia: Microcytic with iron deficiency on panel. Stable from 12/12, left AMA prior to transfusion. Certainly contributing to dyspnea.  - Transfuse 2u PRBCs - Start iron supplementation - Check FOBT   Protein calorie malnutrition: Severe, cachectic - Consult nutrition   Hypokalemia: Repleted in ED - Recheck in AM - Check Mg  Polysubstance abuse and homelessness:  - Clonidine detox protocol - CSW consulted - Cessation counseling briefly provided - Will avoid IV narcotics and benzodiazepines   History of noncompliance, leaving against medical advice: Pt is significant flight risk, but seems to be competent to make her own medical decisions at the time of my exam. I've reviewed the significant risks, including death which I told her is inevitable, if treatment is not provided.   DVT prophylaxis: SCDs  Code Status: Full  Family Communication: Boyfriend at bedside per pt request.  Disposition Plan: Inpatient telemetry, for treatment of bacteremia, likely endocarditis with IV abx, and symptomatic anemia with transfusions.  Consults called: ID, Dr. Luciana Axe  Admission status: Inpatient    Hazeline Junker, MD [facility] Pager [phone]  If 7PM-7AM, please contact night-coverage [email or web address] Password [facility] 03/15/2016, 6:12 PM

## 2016-03-15 NOTE — Progress Notes (Signed)
Pharmacy Antibiotic Note  Catherine Hernandez is a 30 y.o. female admitted on 03/15/2016 with pneumonia.  She also has a hx of enterococcal bacteremia/endocarditis in Sep 2017 & known heroin use (although states not IV).  She was to be admitted 12/12 but left AMA, returned to ED today for admission.  Pharmacy has been consulted for Cefepime & Vancomycin dosing.  03/15/2016:   CXR + PNA  Afebrile  No leukocytosis  LA 2.28-->1.16  Scr 0.54; est CrCl ~ 2770ml/min  Plan: Vancomycin 500mg  IV q12h (goal trough 15-4420mcg/ml) Cefepime 1gm IV q8h Check Vancomycin trough at steady state Monitor renal function and cx data      Temp (24hrs), Avg:98.2 F (36.8 C), Min:96.5 F (35.8 C), Max:99.1 F (37.3 C)   Recent Labs Lab 03/14/16 1005 03/14/16 1014 03/15/16 0935 03/15/16 0942 03/15/16 1243  WBC 11.6*  --  9.2  --   --   CREATININE 0.54  --  0.54  --   --   LATICACIDVEN  --  0.98  --  2.28* 1.16    CrCl cannot be calculated (Unknown ideal weight.).    No Known Allergies  Antimicrobials this admission: 12/13 Cefepime >>  12/13 Vanc >>   Dose adjustments this admission:  Microbiology results: 12/13 BCx: sent 12/12 BCx: +Serratia per BCID 12/13 UCx: sent  12/13 MRSA PCR: pending 12/16/15: Enterococcus faecalis (pan-sens)  Thank you for allowing pharmacy to be a part of this patient's care.  Junita PushMichelle Amayrany Cafaro, PharmD, BCPS Pager: [phone] 03/15/2016 5:59 PM

## 2016-03-16 LAB — BLOOD CULTURE ID PANEL (REFLEXED)
Acinetobacter baumannii: NOT DETECTED
CANDIDA ALBICANS: NOT DETECTED
CANDIDA KRUSEI: NOT DETECTED
Candida glabrata: NOT DETECTED
Candida parapsilosis: NOT DETECTED
Candida tropicalis: NOT DETECTED
Carbapenem resistance: NOT DETECTED
ENTEROBACTER CLOACAE COMPLEX: NOT DETECTED
ENTEROBACTERIACEAE SPECIES: DETECTED — AB
ENTEROCOCCUS SPECIES: NOT DETECTED
Escherichia coli: NOT DETECTED
Haemophilus influenzae: NOT DETECTED
KLEBSIELLA OXYTOCA: NOT DETECTED
Klebsiella pneumoniae: NOT DETECTED
LISTERIA MONOCYTOGENES: NOT DETECTED
Neisseria meningitidis: NOT DETECTED
PSEUDOMONAS AERUGINOSA: NOT DETECTED
Proteus species: NOT DETECTED
STAPHYLOCOCCUS AUREUS BCID: NOT DETECTED
STREPTOCOCCUS PNEUMONIAE: NOT DETECTED
STREPTOCOCCUS PYOGENES: NOT DETECTED
STREPTOCOCCUS SPECIES: NOT DETECTED
Serratia marcescens: DETECTED — AB
Staphylococcus species: NOT DETECTED
Streptococcus agalactiae: NOT DETECTED

## 2016-03-17 LAB — CULTURE, BLOOD (ROUTINE X 2)

## 2016-03-17 LAB — URINE CULTURE
CULTURE: NO GROWTH
Culture: <10000 — AB

## 2016-03-18 ENCOUNTER — Telehealth: Payer: Self-pay

## 2016-03-18 LAB — CULTURE, BLOOD (ROUTINE X 2)

## 2016-03-18 NOTE — Telephone Encounter (Signed)
Pt with + Blood culture admitted to hospital but left AMA   [facility]

## 2016-03-19 ENCOUNTER — Encounter (HOSPITAL_COMMUNITY): Payer: Self-pay

## 2016-03-19 ENCOUNTER — Inpatient Hospital Stay (HOSPITAL_COMMUNITY)
Admission: EM | Admit: 2016-03-19 | Discharge: 2016-03-28 | DRG: 871 | Disposition: A | Discharge status: Home / Self Care | Payer: Medicaid Other | Attending: Internal Medicine | Admitting: Internal Medicine

## 2016-03-19 ENCOUNTER — Emergency Department (HOSPITAL_COMMUNITY): Payer: Medicaid Other

## 2016-03-19 DIAGNOSIS — J188 Other pneumonia, unspecified organism: Secondary | ICD-10-CM | POA: Diagnosis present

## 2016-03-19 DIAGNOSIS — A4153 Sepsis due to Serratia: Principal | ICD-10-CM | POA: Diagnosis present

## 2016-03-19 DIAGNOSIS — F1110 Opioid abuse, uncomplicated: Secondary | ICD-10-CM | POA: Diagnosis present

## 2016-03-19 DIAGNOSIS — I38 Endocarditis, valve unspecified: Secondary | ICD-10-CM | POA: Diagnosis present

## 2016-03-19 DIAGNOSIS — D638 Anemia in other chronic diseases classified elsewhere: Secondary | ICD-10-CM | POA: Diagnosis present

## 2016-03-19 DIAGNOSIS — Z681 Body mass index (BMI) 19 or less, adult: Secondary | ICD-10-CM

## 2016-03-19 DIAGNOSIS — Z9114 Patient's other noncompliance with medication regimen: Secondary | ICD-10-CM

## 2016-03-19 DIAGNOSIS — J189 Pneumonia, unspecified organism: Secondary | ICD-10-CM | POA: Diagnosis present

## 2016-03-19 DIAGNOSIS — F1410 Cocaine abuse, uncomplicated: Secondary | ICD-10-CM | POA: Diagnosis present

## 2016-03-19 DIAGNOSIS — F17210 Nicotine dependence, cigarettes, uncomplicated: Secondary | ICD-10-CM | POA: Diagnosis present

## 2016-03-19 DIAGNOSIS — Z8701 Personal history of pneumonia (recurrent): Secondary | ICD-10-CM

## 2016-03-19 DIAGNOSIS — I071 Rheumatic tricuspid insufficiency: Secondary | ICD-10-CM

## 2016-03-19 DIAGNOSIS — I76 Septic arterial embolism: Secondary | ICD-10-CM

## 2016-03-19 DIAGNOSIS — E43 Unspecified severe protein-calorie malnutrition: Secondary | ICD-10-CM | POA: Diagnosis present

## 2016-03-19 DIAGNOSIS — F1120 Opioid dependence, uncomplicated: Secondary | ICD-10-CM | POA: Diagnosis present

## 2016-03-19 DIAGNOSIS — Z590 Homelessness: Secondary | ICD-10-CM

## 2016-03-19 DIAGNOSIS — D649 Anemia, unspecified: Secondary | ICD-10-CM

## 2016-03-19 DIAGNOSIS — I2699 Other pulmonary embolism without acute cor pulmonale: Secondary | ICD-10-CM | POA: Diagnosis present

## 2016-03-19 DIAGNOSIS — Z515 Encounter for palliative care: Secondary | ICD-10-CM

## 2016-03-19 DIAGNOSIS — D509 Iron deficiency anemia, unspecified: Secondary | ICD-10-CM | POA: Diagnosis present

## 2016-03-19 DIAGNOSIS — A419 Sepsis, unspecified organism: Secondary | ICD-10-CM | POA: Diagnosis present

## 2016-03-19 DIAGNOSIS — R079 Chest pain, unspecified: Secondary | ICD-10-CM

## 2016-03-19 DIAGNOSIS — Z9119 Patient's noncompliance with other medical treatment and regimen: Secondary | ICD-10-CM

## 2016-03-19 DIAGNOSIS — R0602 Shortness of breath: Secondary | ICD-10-CM

## 2016-03-19 DIAGNOSIS — Z7189 Other specified counseling: Secondary | ICD-10-CM

## 2016-03-19 LAB — CBC WITH DIFFERENTIAL/PLATELET
Basophils Absolute: 0 10*3/uL (ref 0.0–0.1)
Basophils Relative: 0 %
EOS PCT: 0 %
Eosinophils Absolute: 0 10*3/uL (ref 0.0–0.7)
HCT: 15.5 % — ABNORMAL LOW (ref 36.0–46.0)
HEMOGLOBIN: 4.4 g/dL — AB (ref 12.0–15.0)
LYMPHS PCT: 12 %
Lymphs Abs: 1.7 10*3/uL (ref 0.7–4.0)
MCH: 21.4 pg — ABNORMAL LOW (ref 26.0–34.0)
MCHC: 28.4 g/dL — ABNORMAL LOW (ref 30.0–36.0)
MCV: 75.2 fL — AB (ref 78.0–100.0)
MONOS PCT: 5 %
Monocytes Absolute: 0.7 10*3/uL (ref 0.1–1.0)
NEUTROS PCT: 83 %
Neutro Abs: 12 10*3/uL — ABNORMAL HIGH (ref 1.7–7.7)
PLATELETS: 319 10*3/uL (ref 150–400)
RBC: 2.06 MIL/uL — AB (ref 3.87–5.11)
RDW: 18.5 % — ABNORMAL HIGH (ref 11.5–15.5)
WBC: 14.4 10*3/uL — AB (ref 4.0–10.5)

## 2016-03-19 LAB — COMPREHENSIVE METABOLIC PANEL
ALT: 23 U/L (ref 14–54)
AST: 28 U/L (ref 15–41)
Albumin: 1.9 g/dL — ABNORMAL LOW (ref 3.5–5.0)
Alkaline Phosphatase: 112 U/L (ref 38–126)
Anion gap: 10 (ref 5–15)
BUN: 9 mg/dL (ref 6–20)
CHLORIDE: 104 mmol/L (ref 101–111)
CO2: 20 mmol/L — ABNORMAL LOW (ref 22–32)
Calcium: 7.8 mg/dL — ABNORMAL LOW (ref 8.9–10.3)
Creatinine, Ser: 0.75 mg/dL (ref 0.44–1.00)
GFR calc Af Amer: >60 mL/min (ref >60)
Glucose, Bld: 89 mg/dL (ref 65–99)
POTASSIUM: 3.6 mmol/L (ref 3.5–5.1)
Sodium: 134 mmol/L — ABNORMAL LOW (ref 135–145)
Total Bilirubin: 0.2 mg/dL — ABNORMAL LOW (ref 0.3–1.2)
Total Protein: 6.9 g/dL (ref 6.5–8.1)

## 2016-03-19 LAB — I-STAT CG4 LACTIC ACID, ED: LACTIC ACID, VENOUS: 3.12 mmol/L — AB (ref 0.5–1.9)

## 2016-03-19 LAB — I-STAT TROPONIN, ED: Troponin i, poc: 0 ng/mL (ref 0.00–0.08)

## 2016-03-19 MED ORDER — CEFEPIME HCL 2 G IJ SOLR
2.0000 g | Freq: Three times a day (TID) | INTRAMUSCULAR | Status: DC
Start: 2016-03-19 — End: 2016-03-20
  Administered 2016-03-19: 2 g via INTRAVENOUS
  Filled 2016-03-19: qty 2

## 2016-03-19 MED ORDER — ACETAMINOPHEN 325 MG PO TABS
650.0000 mg | ORAL_TABLET | Freq: Once | ORAL | Status: AC
  Administered 2016-03-19: 650 mg via ORAL

## 2016-03-19 MED ORDER — ACETAMINOPHEN 325 MG PO TABS
ORAL_TABLET | ORAL | Status: AC
  Filled 2016-03-19: qty 2

## 2016-03-19 MED ORDER — SODIUM CHLORIDE 0.9 % IV BOLUS (SEPSIS)
1000.0000 mL | Freq: Once | INTRAVENOUS | Status: AC
  Administered 2016-03-19: 1000 mL via INTRAVENOUS

## 2016-03-19 MED ORDER — ACETAMINOPHEN 325 MG PO TABS: 650.0000 mg | ORAL_TABLET | Freq: Once | ORAL | Status: DC

## 2016-03-19 MED ORDER — SODIUM CHLORIDE 0.9 % IV BOLUS (SEPSIS)
500.0000 mL | Freq: Once | INTRAVENOUS | Status: AC
  Administered 2016-03-19: 500 mL via INTRAVENOUS

## 2016-03-19 MED ORDER — HYDROMORPHONE HCL 2 MG/ML IJ SOLN
1.0000 mg | Freq: Once | INTRAMUSCULAR | Status: DC
  Filled 2016-03-19: qty 1

## 2016-03-19 MED ORDER — SODIUM CHLORIDE 0.9 % IV SOLN
Freq: Once | INTRAVENOUS | Status: AC
  Administered 2016-03-20: 06:00:00 via INTRAVENOUS

## 2016-03-19 NOTE — Progress Notes (Signed)
Pharmacy Antibiotic Note  Catherine Hernandez is a 30 y.o. female admitted on 03/19/2016 with Serratia bacteremia and history of IVDU. [facility] and had blood cultures drawn prior to leaving AMA. Patient called to return to ED due to positive cultures. Pharmacy has been consulted for cefepime dosing.  Plan: - Cefepime 2g IV q8h - Monitor C&S, CBC and length of therapy    Temp (24hrs), Avg:102.9 F (39.4 C), Min:102.9 F (39.4 C), Max:102.9 F (39.4 C)   Recent Labs Lab 03/14/16 1005 03/14/16 1014 03/15/16 0935 03/15/16 0942 03/15/16 1243  WBC 11.6*  --  9.2  --   --   CREATININE 0.54  --  0.54  --   --   LATICACIDVEN  --  0.98  --  2.28* 1.16    CrCl cannot be calculated (Unknown ideal weight.).    No Known Allergies  Antimicrobials this admission: Cefepime 12/17 >>   Dose adjustments this admission:  Microbiology results: 12/12 BCx: Serratia (S-cefepime) 12/17 BCx:  12/17 UCx:  Thank you for allowing pharmacy to be a part of this patient's care.  Casilda Carlsaylor Lastacia Solum, PharmD, BCPS PGY-2 Infectious Diseases Pharmacy Resident Pager: [phone] 03/19/2016 10:06 PM

## 2016-03-19 NOTE — ED Triage Notes (Signed)
Pt states seen by [facility], received call today to return to ED for "blood infection." Pt complaining of chest pain and shortness of breath. Pt states coughing x 2 weeks. Pt states pneumonia in both lungs. Pt pale and tachypnic at triage.

## 2016-03-19 NOTE — ED Provider Notes (Signed)
[facility]-EMER[facility]ENCY DEPT Provider Note   CSN: [id number] Arrival date & time: 03/19/16  2129     History   Chief Complaint Chief Complaint  Patient presents with  . Chest Pain  . Other    positive blood cultures    HPI Catherine Hernandez is a 30 y.o. female.  HPI  30 y.o. female with a hx of recent endocarditis and pneumonia, heroin addiction, homlessness, presents to the Emergency Department today due to be called in for positive blood cultures. In hospital recently on 03-15-16 for same. Left AMA due to not receiving narcotic pain medication. On that day she was septic with elevated HR, temp. Found to be anemic with Hgb 5.7. Recent blood cultures positive for Serratia. Treated with Cefepime in hospital. At this time, pt came to [facility] because "we treat her better." Notes mild chest pain currently with shortness of breath. Says the pain got better with IV ABX in hospital when she was admitted. Notes fevers. Nausea. Chills. No ABD pain. Last use of heroin was x 4 hours ago via IV and snorted heroin 1 hour ago. No rectal bleeding. No hematemesis. No other symptoms noted.   Past Medical History:  Diagnosis Date  . Anemia   . CAP (community acquired pneumonia) 11/16/2015  . Childhood asthma   . Enterococcal bacteremia   . Heroin abuse   . Hyponatremia   . Pneumonia 2007  . Polysubstance abuse     Patient Active Problem List   Diagnosis Date Noted  . HCAP (healthcare-associated pneumonia) 03/15/2016  . Serratia sepsis (HCC) 03/15/2016  . Bacteremia 03/15/2016  . Iron deficiency anemia 03/15/2016  . Community acquired pneumonia 03/14/2016  . Heroin abuse 03/14/2016  . Symptomatic anemia 03/14/2016  . Leukocytosis 03/14/2016  . Multifocal pneumonia 03/14/2016  . Enterococcal sepsis (HCC) 12/19/2015  . Palliative care encounter   . Hypokalemia   . Noncompliance with medication regimen   . Altered mental status 12/16/2015  . Ankle sprain 12/16/2015  . 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 Tachypnea   . Dyspnea   . 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 Polysubstance abuse   . Enterococcal bacteremia 11/18/2015  . Staphylococcus aureus bacteremia 11/18/2015  . Normocytic anemia 11/18/2015  . Protein-calorie malnutrition (HCC) 11/18/2015  . Poor dentition 11/18/2015  . Malnutrition of moderate degree 11/18/2015  . AKI (acute kidney injury) (HCC)   . Opioid use disorder, severe, dependence (HCC) 11/17/2015  . Opioid use with withdrawal (HCC) 11/17/2015  . Hyponatremia 11/17/2015  . Acute renal failure (HCC) 11/17/2015  . Hypocalcemia 11/17/2015  . Nonspecific abnormal electrocardiogram (ECG) (EKG) 11/17/2015  . Thrombocytopenia (HCC) 11/17/2015  . Sepsis (HCC) 11/16/2015    Past Surgical History:  Procedure Laterality Date  . NO PAST SURGERIES    . TEE WITHOUT CARDIOVERSION N/A 11/23/2015   Procedure: TRANSESOPHAGEAL ECHOCARDIOGRAM (TEE) WITH ANESTHESIA;  Surgeon: Quintella Reichert, MD;  Location: [facility] ENDOSCOPY;  Service: Cardiovascular;  Laterality: N/A;    OB History    Gravida Para Term Preterm AB Living   1 1 1  0 0 1   SAB TAB Ectopic Multiple Live Births   0 0 0 0 1       Home Medications    Prior to Admission medications   Medication Sig Start Date End Date Taking? Authorizing Provider  amoxicillin-clavulanate (AUGMENTIN) 875-125 MG tablet Take 1 tablet by mouth 2 (two) times daily. Patient not taking: Reported on 03/14/2016 12/19/15   Catarina Hartshorn, MD  feeding supplement (  BOOST / RESOURCE BREEZE) LIQD Take 1 Container by mouth 2 (two) times daily between meals. Patient not taking: Reported on 03/14/2016 12/02/15   Clydia LlanoMutaz Elmahi, MD    Family History Family History  Problem Relation Age of Onset  . Anesthesia problems Neg Hx   . Hypotension Neg Hx   . Malignant hyperthermia Neg Hx   . Pseudochol deficiency Neg Hx     Social History Social History  Substance Use Topics  . Smoking status: Current Every Day Smoker    Packs/day: 0.50      Years: 10.00    Types: Cigarettes  . Smokeless tobacco: Never Used  . Alcohol use No     Allergies   Patient has no known allergies.   Review of Systems Review of Systems ROS reviewed and all are negative for acute change except as noted in the HPI.  Physical Exam Updated Vital Signs BP 115/74   Pulse (!) 132   Temp 102.9 F (39.4 C) (Oral)   Resp (!) 28   SpO2 98%   Physical Exam  Constitutional: She is oriented to person, place, and time. Vital signs are normal. She appears well-developed and well-nourished. No distress.  Chronically Ill Appearing. Pale  HENT:  Head: Normocephalic and atraumatic.  Right Ear: Hearing, tympanic membrane, external ear and ear canal normal.  Left Ear: Hearing, tympanic membrane, external ear and ear canal normal.  Nose: Nose normal.  Mouth/Throat: Uvula is midline, oropharynx is clear and moist and mucous membranes are normal. Abnormal dentition.  Eyes: Conjunctivae and EOM are normal. Pupils are equal, round, and reactive to light.  Neck: Trachea normal and normal range of motion. Neck supple. No spinous process tenderness and no muscular tenderness present. No tracheal deviation and normal range of motion present.  Cardiovascular: Regular rhythm, S1 normal, S2 normal, normal heart sounds, intact distal pulses and normal pulses.  Tachycardia present.   Pulmonary/Chest: Effort normal. Tachypnea noted. No respiratory distress. She has decreased breath sounds in the right upper field, the right lower field, the left upper field and the left lower field. She has no wheezes. She has no rhonchi. She has no rales.  Abdominal: Normal appearance and bowel sounds are normal. There is no tenderness. There is no rigidity and no guarding.  Musculoskeletal: Normal range of motion.  Neurological: She is alert and oriented to person, place, and time. She has normal strength. No cranial nerve deficit or sensory deficit.  Skin: Skin is warm and dry.   Psychiatric: She has a normal mood and affect. Her speech is normal and behavior is normal. Thought content normal.  Nursing note and vitals reviewed.  ED Treatments / Results  Labs (all labs ordered are listed, but only abnormal results are displayed) Labs Reviewed  COMPREHENSIVE METABOLIC PANEL - Abnormal; Notable for the following:       Result Value   Sodium 134 (*)    CO2 20 (*)    Calcium 7.8 (*)    Albumin 1.9 (*)    Total Bilirubin 0.2 (*)    All other components within normal limits  CBC WITH DIFFERENTIAL/PLATELET - Abnormal; Notable for the following:    WBC 14.4 (*)    RBC 2.06 (*)    Hemoglobin 4.4 (*)    HCT 15.5 (*)    MCV 75.2 (*)    MCH 21.4 (*)    MCHC 28.4 (*)    RDW 18.5 (*)    Neutro Abs 12.0 (*)    All  other components within normal limits  URINALYSIS, ROUTINE W REFLEX MICROSCOPIC - Abnormal; Notable for the following:    Glucose, UA 50 (*)    Hgb urine dipstick MODERATE (*)    Protein, ur 30 (*)    Bacteria, UA RARE (*)    Squamous Epithelial / LPF 0-5 (*)    All other components within normal limits  I-STAT CG4 LACTIC ACID, ED - Abnormal; Notable for the following:    Lactic Acid, Venous 3.12 (*)    All other components within normal limits  CULTURE, BLOOD (ROUTINE X 2)  CULTURE, BLOOD (ROUTINE X 2)  URINE CULTURE  I-STAT TROPOININ, ED  TYPE AND SCREEN  PREPARE RBC (CROSSMATCH)    EKG  EKG Interpretation None       Radiology No results found.  Procedures Procedures (including critical care time) CRITICAL CARE Performed by: Eston Esters  Total critical care time: 40 minutes  Critical care time was exclusive of separately billable procedures and treating other patients.  Critical care was necessary to treat or prevent imminent or life-threatening deterioration.  Critical care was time spent personally by me on the following activities: development of treatment plan with patient and/or surrogate as well as nursing, discussions  with consultants, evaluation of patient's response to treatment, examination of patient, obtaining history from patient or surrogate, ordering and performing treatments and interventions, ordering and review of laboratory studies, ordering and review of radiographic studies, pulse oximetry and re-evaluation of patient's condition.  Medications Ordered in ED Medications  ceFEPIme (MAXIPIME) 2 g in dextrose 5 % 50 mL IVPB (2 g Intravenous New Bag/Given 03/19/16 2348)  0.9 %  sodium chloride infusion (not administered)  acetaminophen (TYLENOL) tablet 650 mg (650 mg Oral Given 03/19/16 2143)  sodium chloride 0.9 % bolus 1,000 mL (1,000 mLs Intravenous New Bag/Given 03/19/16 2256)    And  sodium chloride 0.9 % bolus 500 mL (0 mLs Intravenous Stopped 03/19/16 2332)   Initial Impression / Assessment and Plan / ED Course  I have reviewed the triage vital signs and the nursing notes.  Pertinent labs & imaging results that were available during my care of the patient were reviewed by me and considered in my medical decision making (see chart for details).  Clinical Course    Final Clinical Impressions(s) / ED Diagnoses  {I have reviewed and evaluated the relevant laboratory values. {I have reviewed and evaluated the relevant imaging studies. {I have interpreted the relevant EKG. {I have reviewed the relevant previous healthcare records. {I obtained HPI from historian. {Patient discussed with supervising physician.  ED Course:  Assessment: Pt is a 30yF with hx recent endocarditis and pneumonia, heroin addiction, homlessness who presents due to being called in for positive blood cultures. Known endocarditis and pneumonia. Left AMA from hospital due to not receiving adequate pain meds and preferring [facility] over [facility]. On exam, pt Septic appearing. VS with tachycardia, normotensive. Febrile 102.9. Lungs diminished breath sounds. Abdomen nontender soft. iStat Lactate 3.12. WBC 14.4. Hgb 4.4. Type and Screen  obtained. PRBCs ordered. Code Sepsis initiated. Made NPO. Given Cefepime to cover septicemia from likely endocarditis per pharmacy consult. NS given as per Sepsis protocol. CXR pending. Plan is to Admit to medicine. Repeat sepsis assessment completed.  Disposition/Plan:  Admit Pt acknowledges and agrees with plan  Supervising Physician Donnetta Hutching, MD  Final diagnoses:  Symptomatic anemia  Septicemia [facility])    New Prescriptions New Prescriptions   No medications on file     [facility]  Marlise EvesMohr, PA-C 03/20/16 0007    Donnetta HutchingBrian Cook, MD 03/24/16 [phone]

## 2016-03-19 NOTE — ED Notes (Signed)
RN called to room. Pt states "My IV just came out. It wasn't taped very well." No bleeding noted at site.

## 2016-03-20 ENCOUNTER — Encounter (HOSPITAL_COMMUNITY): Payer: Self-pay | Admitting: *Deleted

## 2016-03-20 DIAGNOSIS — J189 Pneumonia, unspecified organism: Secondary | ICD-10-CM | POA: Diagnosis not present

## 2016-03-20 DIAGNOSIS — I368 Other nonrheumatic tricuspid valve disorders: Secondary | ICD-10-CM | POA: Diagnosis not present

## 2016-03-20 DIAGNOSIS — I76 Septic arterial embolism: Secondary | ICD-10-CM

## 2016-03-20 DIAGNOSIS — A4153 Sepsis due to Serratia: Principal | ICD-10-CM

## 2016-03-20 DIAGNOSIS — Z832 Family history of diseases of the blood and blood-forming organs and certain disorders involving the immune mechanism: Secondary | ICD-10-CM | POA: Diagnosis not present

## 2016-03-20 DIAGNOSIS — Z8489 Family history of other specified conditions: Secondary | ICD-10-CM | POA: Diagnosis not present

## 2016-03-20 DIAGNOSIS — F1110 Opioid abuse, uncomplicated: Secondary | ICD-10-CM | POA: Diagnosis not present

## 2016-03-20 DIAGNOSIS — Z8679 Personal history of other diseases of the circulatory system: Secondary | ICD-10-CM | POA: Diagnosis not present

## 2016-03-20 DIAGNOSIS — F1410 Cocaine abuse, uncomplicated: Secondary | ICD-10-CM | POA: Diagnosis present

## 2016-03-20 DIAGNOSIS — I330 Acute and subacute infective endocarditis: Secondary | ICD-10-CM | POA: Diagnosis not present

## 2016-03-20 DIAGNOSIS — B9689 Other specified bacterial agents as the cause of diseases classified elsewhere: Secondary | ICD-10-CM | POA: Diagnosis not present

## 2016-03-20 DIAGNOSIS — Z590 Homelessness: Secondary | ICD-10-CM | POA: Diagnosis not present

## 2016-03-20 DIAGNOSIS — R0789 Other chest pain: Secondary | ICD-10-CM | POA: Diagnosis present

## 2016-03-20 DIAGNOSIS — Z9119 Patient's noncompliance with other medical treatment and regimen: Secondary | ICD-10-CM | POA: Diagnosis not present

## 2016-03-20 DIAGNOSIS — I071 Rheumatic tricuspid insufficiency: Secondary | ICD-10-CM | POA: Diagnosis present

## 2016-03-20 DIAGNOSIS — D638 Anemia in other chronic diseases classified elsewhere: Secondary | ICD-10-CM | POA: Diagnosis present

## 2016-03-20 DIAGNOSIS — D649 Anemia, unspecified: Secondary | ICD-10-CM | POA: Diagnosis not present

## 2016-03-20 DIAGNOSIS — F1420 Cocaine dependence, uncomplicated: Secondary | ICD-10-CM | POA: Diagnosis not present

## 2016-03-20 DIAGNOSIS — F17210 Nicotine dependence, cigarettes, uncomplicated: Secondary | ICD-10-CM | POA: Diagnosis present

## 2016-03-20 DIAGNOSIS — E43 Unspecified severe protein-calorie malnutrition: Secondary | ICD-10-CM | POA: Diagnosis present

## 2016-03-20 DIAGNOSIS — Z9114 Patient's other noncompliance with medication regimen: Secondary | ICD-10-CM | POA: Diagnosis not present

## 2016-03-20 DIAGNOSIS — I2690 Septic pulmonary embolism without acute cor pulmonale: Secondary | ICD-10-CM | POA: Diagnosis not present

## 2016-03-20 DIAGNOSIS — R7881 Bacteremia: Secondary | ICD-10-CM | POA: Diagnosis not present

## 2016-03-20 DIAGNOSIS — Z681 Body mass index (BMI) 19 or less, adult: Secondary | ICD-10-CM | POA: Diagnosis not present

## 2016-03-20 DIAGNOSIS — I200 Unstable angina: Secondary | ICD-10-CM | POA: Diagnosis not present

## 2016-03-20 DIAGNOSIS — I38 Endocarditis, valve unspecified: Secondary | ICD-10-CM | POA: Diagnosis present

## 2016-03-20 DIAGNOSIS — Z515 Encounter for palliative care: Secondary | ICD-10-CM | POA: Diagnosis not present

## 2016-03-20 DIAGNOSIS — R079 Chest pain, unspecified: Secondary | ICD-10-CM | POA: Diagnosis not present

## 2016-03-20 DIAGNOSIS — Z8249 Family history of ischemic heart disease and other diseases of the circulatory system: Secondary | ICD-10-CM

## 2016-03-20 DIAGNOSIS — Z8701 Personal history of pneumonia (recurrent): Secondary | ICD-10-CM | POA: Diagnosis not present

## 2016-03-20 DIAGNOSIS — I2699 Other pulmonary embolism without acute cor pulmonale: Secondary | ICD-10-CM | POA: Diagnosis not present

## 2016-03-20 DIAGNOSIS — R509 Fever, unspecified: Secondary | ICD-10-CM | POA: Diagnosis not present

## 2016-03-20 DIAGNOSIS — F1120 Opioid dependence, uncomplicated: Secondary | ICD-10-CM | POA: Diagnosis present

## 2016-03-20 DIAGNOSIS — A419 Sepsis, unspecified organism: Secondary | ICD-10-CM | POA: Diagnosis not present

## 2016-03-20 DIAGNOSIS — D509 Iron deficiency anemia, unspecified: Secondary | ICD-10-CM | POA: Diagnosis present

## 2016-03-20 DIAGNOSIS — Z7189 Other specified counseling: Secondary | ICD-10-CM | POA: Diagnosis not present

## 2016-03-20 LAB — BLOOD CULTURE ID PANEL (REFLEXED)
Acinetobacter baumannii: NOT DETECTED
CANDIDA ALBICANS: NOT DETECTED
CANDIDA GLABRATA: NOT DETECTED
CANDIDA KRUSEI: NOT DETECTED
CANDIDA PARAPSILOSIS: NOT DETECTED
Candida tropicalis: NOT DETECTED
Carbapenem resistance: NOT DETECTED
ENTEROBACTER CLOACAE COMPLEX: NOT DETECTED
ENTEROBACTERIACEAE SPECIES: DETECTED — AB
ESCHERICHIA COLI: NOT DETECTED
Enterococcus species: NOT DETECTED
Haemophilus influenzae: NOT DETECTED
KLEBSIELLA OXYTOCA: NOT DETECTED
KLEBSIELLA PNEUMONIAE: NOT DETECTED
Listeria monocytogenes: NOT DETECTED
METHICILLIN RESISTANCE: DETECTED — AB
Neisseria meningitidis: NOT DETECTED
PROTEUS SPECIES: NOT DETECTED
PSEUDOMONAS AERUGINOSA: NOT DETECTED
STREPTOCOCCUS PYOGENES: NOT DETECTED
Serratia marcescens: DETECTED — AB
Staphylococcus aureus (BCID): NOT DETECTED
Staphylococcus species: DETECTED — AB
Streptococcus agalactiae: NOT DETECTED
Streptococcus pneumoniae: NOT DETECTED
Streptococcus species: NOT DETECTED

## 2016-03-20 LAB — GLUCOSE, CAPILLARY
GLUCOSE-CAPILLARY: 117 mg/dL — AB (ref 65–99)
Glucose-Capillary: 117 mg/dL — ABNORMAL HIGH (ref 65–99)

## 2016-03-20 LAB — URINALYSIS, ROUTINE W REFLEX MICROSCOPIC
BILIRUBIN URINE: NEGATIVE
GLUCOSE, UA: 50 mg/dL — AB
Ketones, ur: NEGATIVE mg/dL
LEUKOCYTES UA: NEGATIVE
NITRITE: NEGATIVE
PH: 8 (ref 5.0–8.0)
Protein, ur: 30 mg/dL — AB
Specific Gravity, Urine: 1.014 (ref 1.005–1.030)

## 2016-03-20 LAB — CBC
HEMATOCRIT: 22.8 % — AB (ref 36.0–46.0)
Hemoglobin: 7.2 g/dL — ABNORMAL LOW (ref 12.0–15.0)
MCH: 25 pg — AB (ref 26.0–34.0)
MCHC: 31.6 g/dL (ref 30.0–36.0)
MCV: 79.2 fL (ref 78.0–100.0)
Platelets: 268 10*3/uL (ref 150–400)
RBC: 2.88 MIL/uL — ABNORMAL LOW (ref 3.87–5.11)
RDW: 18.1 % — AB (ref 11.5–15.5)
WBC: 12.8 10*3/uL — ABNORMAL HIGH (ref 4.0–10.5)

## 2016-03-20 LAB — CULTURE, BLOOD (ROUTINE X 2): CULTURE: NO GROWTH

## 2016-03-20 LAB — HEMOGLOBIN AND HEMATOCRIT, BLOOD
HEMATOCRIT: 21.4 % — AB (ref 36.0–46.0)
Hemoglobin: 6.9 g/dL — CL (ref 12.0–15.0)

## 2016-03-20 LAB — BASIC METABOLIC PANEL
Anion gap: 11 (ref 5–15)
BUN: 6 mg/dL (ref 6–20)
CO2: 20 mmol/L — ABNORMAL LOW (ref 22–32)
CREATININE: 0.56 mg/dL (ref 0.44–1.00)
Calcium: 7.5 mg/dL — ABNORMAL LOW (ref 8.9–10.3)
Chloride: 108 mmol/L (ref 101–111)
GFR calc Af Amer: >60 mL/min (ref >60)
GLUCOSE: 121 mg/dL — AB (ref 65–99)
Potassium: 3.2 mmol/L — ABNORMAL LOW (ref 3.5–5.1)
SODIUM: 139 mmol/L (ref 135–145)

## 2016-03-20 LAB — I-STAT CG4 LACTIC ACID, ED: LACTIC ACID, VENOUS: 1.53 mmol/L (ref 0.5–1.9)

## 2016-03-20 LAB — PREPARE RBC (CROSSMATCH)

## 2016-03-20 LAB — MRSA PCR SCREENING: MRSA BY PCR: POSITIVE — AB

## 2016-03-20 MED ORDER — FOLIC ACID 1 MG PO TABS
1.0000 mg | ORAL_TABLET | Freq: Every day | ORAL | Status: DC
  Administered 2016-03-20 – 2016-03-23 (×4): 1 mg via ORAL
  Filled 2016-03-20 (×4): qty 1

## 2016-03-20 MED ORDER — DEXTROSE 5 % IV SOLN
2.0000 g | INTRAVENOUS | Status: DC
  Administered 2016-03-20 – 2016-03-22 (×3): 2 g via INTRAVENOUS
  Filled 2016-03-20 (×3): qty 2

## 2016-03-20 MED ORDER — VITAMIN B-1 100 MG PO TABS
100.0000 mg | ORAL_TABLET | Freq: Every day | ORAL | Status: DC
  Administered 2016-03-20 – 2016-03-28 (×9): 100 mg via ORAL
  Filled 2016-03-20 (×9): qty 1

## 2016-03-20 MED ORDER — LORAZEPAM 1 MG PO TABS
1.0000 mg | ORAL_TABLET | Freq: Four times a day (QID) | ORAL | Status: AC | PRN
  Administered 2016-03-20 – 2016-03-22 (×7): 1 mg via ORAL
  Filled 2016-03-20 (×7): qty 1

## 2016-03-20 MED ORDER — ACETAMINOPHEN 325 MG PO TABS
650.0000 mg | ORAL_TABLET | Freq: Four times a day (QID) | ORAL | Status: DC | PRN
  Administered 2016-03-20 – 2016-03-23 (×7): 650 mg via ORAL
  Filled 2016-03-20 (×7): qty 2

## 2016-03-20 MED ORDER — SODIUM CHLORIDE 0.9 % IV SOLN
Freq: Once | INTRAVENOUS | Status: AC
  Administered 2016-03-20: 18:00:00 via INTRAVENOUS

## 2016-03-20 MED ORDER — ENOXAPARIN SODIUM 30 MG/0.3ML ~~LOC~~ SOLN
30.0000 mg | SUBCUTANEOUS | Status: DC
  Administered 2016-03-20: 30 mg via SUBCUTANEOUS
  Filled 2016-03-20: qty 0.3

## 2016-03-20 MED ORDER — ADULT MULTIVITAMIN W/MINERALS CH
1.0000 | ORAL_TABLET | Freq: Every day | ORAL | Status: DC
  Administered 2016-03-20 – 2016-03-23 (×4): 1 via ORAL
  Filled 2016-03-20 (×4): qty 1

## 2016-03-20 MED ORDER — TRAMADOL HCL 50 MG PO TABS
50.0000 mg | ORAL_TABLET | Freq: Four times a day (QID) | ORAL | Status: DC | PRN
  Administered 2016-03-20 – 2016-03-28 (×31): 50 mg via ORAL
  Filled 2016-03-20 (×31): qty 1

## 2016-03-20 MED ORDER — LORAZEPAM 2 MG/ML IJ SOLN
1.0000 mg | Freq: Four times a day (QID) | INTRAMUSCULAR | Status: AC | PRN
  Administered 2016-03-20 – 2016-03-23 (×5): 1 mg via INTRAVENOUS
  Filled 2016-03-20 (×5): qty 1

## 2016-03-20 MED ORDER — THIAMINE HCL 100 MG/ML IJ SOLN
100.0000 mg | Freq: Every day | INTRAMUSCULAR | Status: DC
Start: 2016-03-20 — End: 2016-03-24

## 2016-03-20 MED ORDER — CHLORHEXIDINE GLUCONATE CLOTH 2 % EX PADS
6.0000 | MEDICATED_PAD | Freq: Every day | CUTANEOUS | Status: AC
  Administered 2016-03-20 – 2016-03-24 (×5): 6 via TOPICAL

## 2016-03-20 MED ORDER — MUPIROCIN 2 % EX OINT
1.0000 "application " | TOPICAL_OINTMENT | Freq: Two times a day (BID) | CUTANEOUS | Status: AC
  Administered 2016-03-20 – 2016-03-24 (×9): 1 via NASAL
  Filled 2016-03-20 (×2): qty 22

## 2016-03-20 NOTE — Progress Notes (Signed)
PROGRESS NOTE  Catherine Hernandez ZOX:[id number] DOB: Oct 21, 1985 DOA: 03/19/2016 PCP: No PCP Per Patient   LOS: 0 days   Brief Narrative: 30 y.o. female with medical history significant of IVDU that is ongoing with heroin, the emergency room on 12/18 after being called to have positive blood cultures. She has a history of bacteremia and endocarditis as well as pneumonia due to septic emboli and she was in the hospital in September 2017, however left AMA prior to finishing her IV antibiotics. She presented to the ER on 12/12 with shortness of breath, [facility] was asked for admission however patient left AMA due to not receiving narcotic pain medications. She had blood cultures at that time which turned positive for Serratia and she was called to come back.  Assessment & Plan: Principal Problem:   Serratia sepsis (HCC) Active Problems:   Opioid use disorder, severe, dependence (HCC)   Heroin abuse   Symptomatic anemia   Multifocal pneumonia   Bacteremia   Sepsis with serratia bacteremia, multifocal pneumonia with cavitation / septic emboli - Infectious disease consulted, discussed with Dr. Algis Liming this morning, appreciate input - Patient with long-standing history of IV drug use, as well as noncompliance with medical treatment and left AMA several times in the past - Continue IV antibiotics, she will likely need a prolonged course, will likely need placement as well, not a candidate for home PICC  - Still febrile and tachycardic today, remaining step down  Endocarditis - Most recent TTE in August 2017 showed "2 large, very mobile vegetations are seen on the TV leaflets, the larger of which, prolapses through the tricuspid valve with at least mild TR. The largest vegetation measures 2.63 x 1.2 cm in diameter and very shaggy." - Since she did not finish treatment then, likely she has ongoing endocarditis. - In August 2017 she had blood cultures with Enterococcus faecalis as well as MSSA, will  likely need treatment for those, defer antibiotics to ID  Drug abuse - counseled, discussed that she will not get any narcotic medication in the hospital, place on CIWA for withdrawal and use tramadol for pain  Symptomatic anemia - Likely from ongoing smoldering sepsis, transfused with goal hemoglobin above 7, fecal occult is pending. Hemoglobin last week was 5.7 when she left AMA  Protein calorie malnutrition    DVT prophylaxis: SCD Code Status: Full code Family Communication: no family bedside Disposition Plan: TBD  Consultants:   ID  Procedures:   None   Antimicrobials:  Ceftriaxone 12/18 >>   Subjective: - feeling very anxious, complains of shortness of breath, coughing and chest pains  Objective: Vitals:   03/20/16 0800 03/20/16 0918 03/20/16 1200 03/20/16 1202  BP: (!) 156/75 138/71 137/79   Pulse: 93 100 (!) 126 (!) 128  Resp: (!) 35 (!) 24 (!) 27 (!) 28  Temp: 98.3 F (36.8 C) 98.5 F (36.9 C) (!) 100.9 F (38.3 C)   TempSrc: Axillary Oral Oral   SpO2: 100% 100% 98% 100%  Weight:      Height:        Intake/Output Summary (Last 24 hours) at 03/20/16 1222 Last data filed at 03/20/16 1203  Gross per 24 hour  Intake             2007 ml  Output              700 ml  Net             1307 ml   [employer]  03/20/16 0000 03/20/16 0235  Weight: 43.1 kg (95 lb 0.3 oz) 43.5 kg (95 lb 14.4 oz)    Examination: Constitutional: ill appearing  Vitals:   03/20/16 0800 03/20/16 0918 03/20/16 1200 03/20/16 1202  BP: (!) 156/75 138/71 137/79   Pulse: 93 100 (!) 126 (!) 128  Resp: (!) 35 (!) 24 (!) 27 (!) 28  Temp: 98.3 F (36.8 C) 98.5 F (36.9 C) (!) 100.9 F (38.3 C)   TempSrc: Axillary Oral Oral   SpO2: 100% 100% 98% 100%  Weight:      Height:       Eyes: PERRL Respiratory: coarse breath sounds, no wheezing   Cardiovascular: Regular rate and rhythm, soft SEM Abdomen: no tenderness. Bowel sounds positive.  Musculoskeletal: no clubbing / cyanosis.   Skin: no rashes, lesions, ulcers. No induration Neurologic: non focal   Data Reviewed: I have personally reviewed following labs and imaging studies  CBC:  Recent Labs Lab 03/14/16 1005 03/15/16 0935 03/19/16 2030  WBC 11.6* 9.2 14.4*  NEUTROABS 9.1* 7.8* 12.0*  HGB 5.7* 5.7* 4.4*  HCT 18.7* 19.6* 15.5*  MCV 76.6* 79.4 75.2*  PLT 241 246 319   Basic Metabolic Panel:  Recent Labs Lab 03/14/16 1005 03/15/16 0935 03/19/16 2030  NA 133* 136 134*  K 2.9* 2.9* 3.6  CL 96* 103 104  CO2 29 24 20*  GLUCOSE 95 184* 89  BUN 12 11 9   CREATININE 0.54 0.54 0.75  CALCIUM 7.6* 8.0* 7.8*   GFR: Estimated Creatinine Clearance: 71.9 mL/min (by C-G formula based on SCr of 0.75 mg/dL). Liver Function Tests:  Recent Labs Lab 03/14/16 1005 03/15/16 0935 03/19/16 2030  AST 21 23 28   ALT 10* 11* 23  ALKPHOS 89 84 112  BILITOT 0.7 0.5 0.2*  PROT 7.4 7.4 6.9  ALBUMIN 2.4* 2.2* 1.9*   No results for input(s): LIPASE, AMYLASE in the last 168 hours. No results for input(s): AMMONIA in the last 168 hours. Coagulation Profile: No results for input(s): INR, PROTIME in the last 168 hours. Cardiac Enzymes:  Recent Labs Lab 03/15/16 0935  TROPONINI <0.03   BNP (last 3 results) No results for input(s): PROBNP in the last 8760 hours. HbA1C: No results for input(s): HGBA1C in the last 72 hours. CBG:  Recent Labs Lab 03/20/16 1201  GLUCAP 117*   Lipid Profile: No results for input(s): CHOL, HDL, LDLCALC, TRIG, CHOLHDL, LDLDIRECT in the last 72 hours. Thyroid Function Tests: No results for input(s): TSH, T4TOTAL, FREET4, T3FREE, THYROIDAB in the last 72 hours. Anemia Panel: No results for input(s): VITAMINB12, FOLATE, FERRITIN, TIBC, IRON, RETICCTPCT in the last 72 hours. Urine analysis:    Component Value Date/Time   COLORURINE YELLOW 03/19/2016 2139   APPEARANCEUR CLEAR 03/19/2016 2139   LABSPEC 1.014 03/19/2016 2139   PHURINE 8.0 03/19/2016 2139   GLUCOSEU 50 (A)  03/19/2016 2139   HGBUR MODERATE (A) 03/19/2016 2139   BILIRUBINUR NEGATIVE 03/19/2016 2139   KETONESUR NEGATIVE 03/19/2016 2139   PROTEINUR 30 (A) 03/19/2016 2139   UROBILINOGEN 0.2 01/07/2012 1140   NITRITE NEGATIVE 03/19/2016 2139   LEUKOCYTESUR NEGATIVE 03/19/2016 2139   Sepsis Labs: Invalid input(s): PROCALCITONIN, LACTICIDVEN  Recent Results (from the past 240 hour(s))  Culture, blood (Routine X 2) w Reflex to ID Panel     Status: Abnormal   Collection Time: 03/14/16 10:05 AM  Result Value Ref Range Status   Specimen Description BLOOD RIGHT FOREARM  Final   Special Requests BOTTLES DRAWN AEROBIC AND ANAEROBIC  5ML  Final   Culture  Setup Time   Final    GRAM NEGATIVE RODS IN BOTH AEROBIC AND ANAEROBIC BOTTLES CRITICAL RESULT CALLED TO, READ BACK BY AND VERIFIED WITH: Kermit BaloL ADKINS RN [phone] 03/15/16 A BROWNING Performed at [facility]    Culture SERRATIA MARCESCENS (A)  Final   Report Status 03/17/2016 FINAL  Final   Organism ID, Bacteria SERRATIA MARCESCENS  Final      Susceptibility   Serratia marcescens - MIC*    CEFAZOLIN >=64 RESISTANT Resistant     CEFEPIME <=1 SENSITIVE Sensitive     CEFTAZIDIME <=1 SENSITIVE Sensitive     CEFTRIAXONE <=1 SENSITIVE Sensitive     CIPROFLOXACIN <=0.25 SENSITIVE Sensitive     GENTAMICIN <=1 SENSITIVE Sensitive     TRIMETH/SULFA <=20 SENSITIVE Sensitive     * SERRATIA MARCESCENS  Blood Culture ID Panel (Reflexed)     Status: Abnormal   Collection Time: 03/14/16 10:05 AM  Result Value Ref Range Status   Enterococcus species NOT DETECTED NOT DETECTED Final   Listeria monocytogenes NOT DETECTED NOT DETECTED Final   Staphylococcus species NOT DETECTED NOT DETECTED Final   Staphylococcus aureus NOT DETECTED NOT DETECTED Final   Streptococcus species NOT DETECTED NOT DETECTED Final   Streptococcus agalactiae NOT DETECTED NOT DETECTED Final   Streptococcus pneumoniae NOT DETECTED NOT DETECTED Final   Streptococcus pyogenes NOT  DETECTED NOT DETECTED Final   Acinetobacter baumannii NOT DETECTED NOT DETECTED Final   Enterobacteriaceae species DETECTED (A) NOT DETECTED Final    Comment: CRITICAL RESULT CALLED TO, READ BACK BY AND VERIFIED WITH: L ADKINS RN [phone] 03/15/16 A BROWNING    Enterobacter cloacae complex NOT DETECTED NOT DETECTED Final   Escherichia coli NOT DETECTED NOT DETECTED Final   Klebsiella oxytoca NOT DETECTED NOT DETECTED Final   Klebsiella pneumoniae NOT DETECTED NOT DETECTED Final   Proteus species NOT DETECTED NOT DETECTED Final   Serratia marcescens DETECTED (A) NOT DETECTED Final    Comment: CRITICAL RESULT CALLED TO, READ BACK BY AND VERIFIED WITH: L ADKINS RN [phone] 03/15/16 A BROWNING    Carbapenem resistance NOT DETECTED NOT DETECTED Final   Haemophilus influenzae NOT DETECTED NOT DETECTED Final   Neisseria meningitidis NOT DETECTED NOT DETECTED Final   Pseudomonas aeruginosa NOT DETECTED NOT DETECTED Final   Candida albicans NOT DETECTED NOT DETECTED Final   Candida glabrata NOT DETECTED NOT DETECTED Final   Candida krusei NOT DETECTED NOT DETECTED Final   Candida parapsilosis NOT DETECTED NOT DETECTED Final   Candida tropicalis NOT DETECTED NOT DETECTED Final    Comment: Performed at [facility]  Blood Culture (routine x 2)     Status: Abnormal   Collection Time: 03/15/16  9:20 AM  Result Value Ref Range Status   Specimen Description BLOOD LEFT HAND  Final   Special Requests IN PEDIATRIC BOTTLE 4CC  Final   Culture  Setup Time   Final    GRAM NEGATIVE RODS AEROBIC BOTTLE ONLY CRITICAL RESULT CALLED TO, READ BACK BY AND VERIFIED WITH: D MERRITT RN [phone] 03/16/16 A BROWNING [facility]formed [facility]    Culture SERRATIA MARCESCENS (A)  Final   Report Status 03/18/2016 FINAL  Final   Organism ID, Bacteria SERRATIA MARCESCENS  Final      Susceptibility   Serratia marcescens - MIC*    CEFAZOLIN >=64 RESISTANT Resistant     CEFEPIME <=1 SENSITIVE Sensitive      CEFTAZIDIME <=1 SENSITIVE Sensitive  CEFTRIAXONE <=1 SENSITIVE Sensitive     CIPROFLOXACIN <=0.25 SENSITIVE Sensitive     GENTAMICIN <=1 SENSITIVE Sensitive     TRIMETH/SULFA <=20 SENSITIVE Sensitive     * SERRATIA MARCESCENS  Blood Culture ID Panel (Reflexed)     Status: Abnormal   Collection Time: 03/15/16  9:20 AM  Result Value Ref Range Status   Enterococcus species NOT DETECTED NOT DETECTED Final   Listeria monocytogenes NOT DETECTED NOT DETECTED Final   Staphylococcus species NOT DETECTED NOT DETECTED Final   Staphylococcus aureus NOT DETECTED NOT DETECTED Final   Streptococcus species NOT DETECTED NOT DETECTED Final   Streptococcus agalactiae NOT DETECTED NOT DETECTED Final   Streptococcus pneumoniae NOT DETECTED NOT DETECTED Final   Streptococcus pyogenes NOT DETECTED NOT DETECTED Final   Acinetobacter baumannii NOT DETECTED NOT DETECTED Final   Enterobacteriaceae species DETECTED (A) NOT DETECTED Final    Comment: CRITICAL RESULT CALLED TO, READ BACK BY AND VERIFIED WITH: D MERRITT RN [phone] 03/16/16 A BROWNING    Enterobacter cloacae complex NOT DETECTED NOT DETECTED Final   Escherichia coli NOT DETECTED NOT DETECTED Final   Klebsiella oxytoca NOT DETECTED NOT DETECTED Final   Klebsiella pneumoniae NOT DETECTED NOT DETECTED Final   Proteus species NOT DETECTED NOT DETECTED Final   Serratia marcescens DETECTED (A) NOT DETECTED Final    Comment: CRITICAL RESULT CALLED TO, READ BACK BY AND VERIFIED WITH: D MERRITT RN [phone] 03/16/16 A BROWNING    Carbapenem resistance NOT DETECTED NOT DETECTED Final   Haemophilus influenzae NOT DETECTED NOT DETECTED Final   Neisseria meningitidis NOT DETECTED NOT DETECTED Final   Pseudomonas aeruginosa NOT DETECTED NOT DETECTED Final   Candida albicans NOT DETECTED NOT DETECTED Final   Candida glabrata NOT DETECTED NOT DETECTED Final   Candida krusei NOT DETECTED NOT DETECTED Final   Candida parapsilosis NOT DETECTED NOT DETECTED Final    Candida tropicalis NOT DETECTED NOT DETECTED Final    Comment: Performed at [facility]  Blood Culture (routine x 2)     Status: None (Preliminary result)   Collection Time: 03/15/16  9:30 AM  Result Value Ref Range Status   Specimen Description BLOOD RT [facility]  Final   Special Requests BOTTLES DRAWN AEROBIC AND ANAEROBIC 5CC  Final   Culture   Final    NO GROWTH 4 DAYS Performed at [facility]    Report Status PENDING  Incomplete  Urine culture     Status: Abnormal   Collection Time: 03/15/16  1:00 PM  Result Value Ref Range Status   Specimen Description URINE, RANDOM  Final   Special Requests NONE  Final   Culture (A)  Final    <10,000 COLONIES/mL INSIGNIFICANT GROWTH Performed at [facility]    Report Status 03/17/2016 FINAL  Final  Urine culture     Status: None   Collection Time: 03/15/16  1:00 PM  Result Value Ref Range Status   Specimen Description URINE, CLEAN CATCH  Final   Special Requests NONE  Final   Culture NO GROWTH Performed at [facility]   Final   Report Status 03/17/2016 FINAL  Final  MRSA PCR Screening     Status: Abnormal   Collection Time: 03/15/16  6:00 PM  Result Value Ref Range Status   MRSA by PCR POSITIVE (A) NEGATIVE Final    Comment:        The GeneXpert MRSA Assay (FDA approved for NASAL specimens only), is one component of a  comprehensive MRSA colonization surveillance program. It is not intended to diagnose MRSA infection nor to guide or monitor treatment for MRSA infections. RESULT CALLED TO, READ BACK BY AND VERIFIED WITH: QUEEN RN AT 2149 ON 03/15/16 BY S.VANHOORNE   Blood Culture (routine x 2)     Status: None (Preliminary result)   Collection Time: 03/19/16 11:00 PM  Result Value Ref Range Status   Specimen Description BLOOD RIGHT ARM  Final   Special Requests IN PEDIATRIC BOTTLE 3CC  Final   Culture  Setup Time   Final    GRAM NEGATIVE RODS IN PEDIATRIC BOTTLE Organism ID to follow    Culture  PENDING  Incomplete   Report Status PENDING  Incomplete  MRSA PCR Screening     Status: Abnormal   Collection Time: 03/20/16  3:17 AM  Result Value Ref Range Status   MRSA by PCR POSITIVE (A) NEGATIVE Final    Comment:        The GeneXpert MRSA Assay (FDA approved for NASAL specimens only), is one component of a comprehensive MRSA colonization surveillance program. It is not intended to diagnose MRSA infection nor to guide or monitor treatment for MRSA infections. RESULT CALLED TO, READ BACK BY AND VERIFIED WITH: C. SCHILLER 0901 12.18.2017 N. MORRIS       Radiology Studies: Dg Chest Port 1 View  Result Date: 03/20/2016 CLINICAL DATA:  Chest pain, shortness of breath, cough for 2 weeks. History of pneumonia and blood infection. EXAM: PORTABLE CHEST 1 VIEW COMPARISON:  Chest radiograph March 15, 2016. FINDINGS: Cardiomediastinal silhouette is normal. Central lucencies within LEFT midlung zone airspace opacity. Patchy additional airspace opacities with cavitation. Trace LEFT pleural effusion. No pneumothorax. Soft tissue planes and included osseous structures are nonsuspicious. IMPRESSION: Increasing cavitation of multifocal pneumonia. Trace LEFT pleural effusion. Electronically Signed   By: Awilda Metro M.D.   On: 03/20/2016 00:25     Scheduled Meds: . cefTRIAXone (ROCEPHIN)  IV  2 g Intravenous Q24H  . Chlorhexidine Gluconate Cloth  6 each Topical Q0600  . enoxaparin (LOVENOX) injection  30 mg Subcutaneous Q24H  . folic acid  1 mg Oral Daily  . multivitamin with minerals  1 tablet Oral Daily  . mupirocin ointment  1 application Nasal BID  . thiamine  100 mg Oral Daily   Or  . thiamine  100 mg Intravenous Daily   Continuous Infusions:   Pamella Pert, MD, PhD [facility] Pager [phone]  If 7PM-7AM, please contact night-coverage [email or web address] Password [email or web address] 03/20/2016, 12:22 PM

## 2016-03-20 NOTE — Progress Notes (Signed)
Pt doctor notified of patient temp. Ordered to give tylenol and proceed with transfusion. Will continue to monitor pt closely. Jillyn HiddenStone,Jyaire Koudelka R, RN

## 2016-03-20 NOTE — ED Notes (Signed)
IV nurse starting IV and will get istat lactic acid

## 2016-03-20 NOTE — H&P (Signed)
History and Physical    Catherine Hernandez JYN:[id number] DOB: 07/07/1985 DOA: 03/19/2016   PCP: No PCP Per Patient Chief Complaint:  Chief Complaint  Patient presents with  . Chest Pain  . Other    positive blood cultures    HPI: Catherine Hernandez is a 30 y.o. 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  She [facility] the [facility]th of this [facility] with multifocal PNA.  Started on rocephin and azithromycin.  BCx done on the 12 and then a day later on the 13 have both come back positive for serratia marcescens (this was sensitive to the rocephin)!  ED Course: Given cefepime, IVF bolus for sepsis, hospitalist asked to admit.  Review of Systems: As per HPI otherwise 10 point review of systems negative.    Past Medical History:  Diagnosis Date  . Anemia   . CAP (community acquired pneumonia) 11/16/2015  . Childhood asthma   . Enterococcal bacteremia   . Heroin abuse   . Hyponatremia   . Pneumonia 2007  . Polysubstance abuse     Past Surgical History:  Procedure Laterality Date  . NO PAST SURGERIES    . TEE WITHOUT CARDIOVERSION N/A 11/23/2015   Procedure: TRANSESOPHAGEAL ECHOCARDIOGRAM (TEE) WITH ANESTHESIA;  Surgeon: Quintella Reichertraci R Turner, MD;  Location: [facility];  Service: Cardiovascular;  Laterality: N/A;     reports that she has been smoking Cigarettes.  She has a 5.00 pack-year smoking history. She has never used smokeless tobacco. She reports that she uses drugs, including IV, Cocaine, Heroin, and Oxycodone. She reports that she does not drink alcohol.  No Known Allergies  Family History  Problem Relation Age of Onset  . Anesthesia problems Neg Hx   . Hypotension Neg Hx   . Malignant hyperthermia Neg Hx   . Pseudochol deficiency Neg Hx       Prior to Admission medications   Medication Sig Start Date End Date  Taking? Authorizing Provider  amoxicillin-clavulanate (AUGMENTIN) 875-125 MG tablet Take 1 tablet by mouth 2 (two) times daily. Patient not taking: Reported on 03/14/2016 12/19/15   Catarina Hartshornavid Tat, MD  feeding supplement (BOOST / RESOURCE BREEZE) LIQD Take 1 Container by mouth 2 (two) times daily between meals. Patient not taking: Reported on 03/14/2016 12/02/15   Clydia LlanoMutaz Elmahi, MD    Physical Exam: Vitals:   03/19/16 2300 03/19/16 2315 03/19/16 2330 03/19/16 2345  BP: (!) 96/53 (!) 89/49 107/61 94/64  Pulse: (!) 136 (!) 133 (!) 143 (!) 125  Resp: (!) 30 (!) 33 (!) 28 (!) 33  Temp:      TempSrc:      SpO2: 95% 99% 100% 98%      Constitutional: NAD, calm, comfortable, pale, emaciated Eyes: PERRL, lids and conjunctivae normal ENMT: Mucous membranes are moist. Posterior pharynx clear of any exudate or lesions.Normal dentition.  Neck: normal, supple, no masses, no thyromegaly Respiratory: clear to auscultation bilaterally, no wheezing, no crackles. Normal respiratory effort. No accessory muscle use.  Cardiovascular: Regular rate and rhythm, no murmurs / rubs / gallops. No extremity edema. 2+ pedal pulses. No carotid bruits.  Abdomen: no tenderness, no masses palpated. No hepatosplenomegaly. Bowel sounds positive.  Musculoskeletal: no clubbing / cyanosis. No joint deformity upper and lower extremities. Good ROM, no contractures. Normal muscle tone.  Skin: Track marks over legs Neurologic: CN 2-12 grossly  intact. Sensation intact, DTR normal. Strength 5/5 in all 4.  Psychiatric: Normal judgment and insight. Alert and oriented x 3. Normal mood.    Labs on Admission: I have personally reviewed following labs and imaging studies  CBC:  Recent Labs Lab 03/14/16 1005 03/15/16 0935 03/19/16 2030  WBC 11.6* 9.2 14.4*  NEUTROABS 9.1* 7.8* 12.0*  HGB 5.7* 5.7* 4.4*  HCT 18.7* 19.6* 15.5*  MCV 76.6* 79.4 75.2*  PLT 241 246 319   Basic Metabolic Panel:  Recent Labs Lab 03/14/16 1005  03/15/16 0935 03/19/16 2030  NA 133* 136 134*  K 2.9* 2.9* 3.6  CL 96* 103 104  CO2 29 24 20*  GLUCOSE 95 184* 89  BUN 12 11 9   CREATININE 0.54 0.54 0.75  CALCIUM 7.6* 8.0* 7.8*   GFR: CrCl cannot be calculated (Unknown ideal weight.). Liver Function Tests:  Recent Labs Lab 03/14/16 1005 03/15/16 0935 03/19/16 2030  AST 21 23 28   ALT 10* 11* 23  ALKPHOS 89 84 112  BILITOT 0.7 0.5 0.2*  PROT 7.4 7.4 6.9  ALBUMIN 2.4* 2.2* 1.9*   No results for input(s): LIPASE, AMYLASE in the last 168 hours. No results for input(s): AMMONIA in the last 168 hours. Coagulation Profile: No results for input(s): INR, PROTIME in the last 168 hours. Cardiac Enzymes:  Recent Labs Lab 03/15/16 0935  TROPONINI <0.03   BNP (last 3 results) No results for input(s): PROBNP in the last 8760 hours. HbA1C: No results for input(s): HGBA1C in the last 72 hours. CBG: No results for input(s): GLUCAP in the last 168 hours. Lipid Profile: No results for input(s): CHOL, HDL, LDLCALC, TRIG, CHOLHDL, LDLDIRECT in the last 72 hours. Thyroid Function Tests: No results for input(s): TSH, T4TOTAL, FREET4, T3FREE, THYROIDAB in the last 72 hours. Anemia Panel: No results for input(s): VITAMINB12, FOLATE, FERRITIN, TIBC, IRON, RETICCTPCT in the last 72 hours. Urine analysis:    Component Value Date/Time   COLORURINE YELLOW 03/19/2016 2139   APPEARANCEUR CLEAR 03/19/2016 2139   LABSPEC 1.014 03/19/2016 2139   PHURINE 8.0 03/19/2016 2139   GLUCOSEU 50 (A) 03/19/2016 2139   HGBUR MODERATE (A) 03/19/2016 2139   BILIRUBINUR NEGATIVE 03/19/2016 2139   KETONESUR NEGATIVE 03/19/2016 2139   PROTEINUR 30 (A) 03/19/2016 2139   UROBILINOGEN 0.2 01/07/2012 1140   NITRITE NEGATIVE 03/19/2016 2139   LEUKOCYTESUR NEGATIVE 03/19/2016 2139   Sepsis Labs: @LABRCNTIP (procalcitonin:4,lacticidven:4) ) Recent Results (from the past 240 hour(s))  Culture, blood (Routine X 2) w Reflex to ID Panel     Status: Abnormal     Collection Time: 03/14/16 10:05 AM  Result Value Ref Range Status   Specimen Description BLOOD RIGHT FOREARM  Final   Special Requests BOTTLES DRAWN AEROBIC AND ANAEROBIC  Final   Culture  Setup Time   Final    GRAM NEGATIVE RODS IN BOTH AEROBIC AND ANAEROBIC BOTTLES CRITICAL RESULT CALLED TO, READ BACK BY AND VERIFIED WITHKermit Balo RN [phone] 03/15/16 A BROWNING Performed at [facility]    Culture SERRATIA MARCESCENS (A)  Final   Report Status 03/17/2016 FINAL  Final   Organism ID, Bacteria SERRATIA MARCESCENS  Final      Susceptibility   Serratia marcescens - MIC*    CEFAZOLIN >=64 RESISTANT Resistant     CEFEPIME <=1 SENSITIVE Sensitive     CEFTAZIDIME <=1 SENSITIVE Sensitive     CEFTRIAXONE <=1 SENSITIVE Sensitive     CIPROFLOXACIN <=0.25 SENSITIVE Sensitive     GENTAMICIN <=1  SENSITIVE Sensitive     TRIMETH/SULFA <=20 SENSITIVE Sensitive     * SERRATIA MARCESCENS  Blood Culture ID Panel (Reflexed)     Status: Abnormal   Collection Time: 03/14/16 10:05 AM  Result Value Ref Range Status   Enterococcus species NOT DETECTED NOT DETECTED Final   Listeria monocytogenes NOT DETECTED NOT DETECTED Final   Staphylococcus species NOT DETECTED NOT DETECTED Final   Staphylococcus aureus NOT DETECTED NOT DETECTED Final   Streptococcus species NOT DETECTED NOT DETECTED Final   Streptococcus agalactiae NOT DETECTED NOT DETECTED Final   Streptococcus pneumoniae NOT DETECTED NOT DETECTED Final   Streptococcus pyogenes NOT DETECTED NOT DETECTED Final   Acinetobacter baumannii NOT DETECTED NOT DETECTED Final   Enterobacteriaceae species DETECTED (A) NOT DETECTED Final    Comment: CRITICAL RESULT CALLED TO, READ BACK BY AND VERIFIED WITH: L ADKINS RN [phone] 03/15/16 A BROWNING    Enterobacter cloacae complex NOT DETECTED NOT DETECTED Final   Escherichia coli NOT DETECTED NOT DETECTED Final   Klebsiella oxytoca NOT DETECTED NOT DETECTED Final   Klebsiella pneumoniae NOT DETECTED  NOT DETECTED Final   Proteus species NOT DETECTED NOT DETECTED Final   Serratia marcescens DETECTED (A) NOT DETECTED Final    Comment: CRITICAL RESULT CALLED TO, READ BACK BY AND VERIFIED WITH: L ADKINS RN [phone] 03/15/16 A BROWNING    Carbapenem resistance NOT DETECTED NOT DETECTED Final   Haemophilus influenzae NOT DETECTED NOT DETECTED Final   Neisseria meningitidis NOT DETECTED NOT DETECTED Final   Pseudomonas aeruginosa NOT DETECTED NOT DETECTED Final   Candida albicans NOT DETECTED NOT DETECTED Final   Candida glabrata NOT DETECTED NOT DETECTED Final   Candida krusei NOT DETECTED NOT DETECTED Final   Candida parapsilosis NOT DETECTED NOT DETECTED Final   Candida tropicalis NOT DETECTED NOT DETECTED Final    Comment: Performed at [facility]  Blood Culture (routine x 2)     Status: Abnormal   Collection Time: 03/15/16  9:20 AM  Result Value Ref Range Status   Specimen Description BLOOD LEFT HAND  Final   Special Requests IN PEDIATRIC BOTTLE 4CC  Final   Culture  Setup Time   Final    GRAM NEGATIVE RODS AEROBIC BOTTLE ONLY CRITICAL RESULT CALLED TO, READ BACK BY AND VERIFIED WITH: D MERRITT RN [phone] 03/16/16 A BROWNING Performed at [facility]    Culture SERRATIA MARCESCENS (A)  Final   Report Status 03/18/2016 FINAL  Final   Organism ID, Bacteria SERRATIA MARCESCENS  Final      Susceptibility   Serratia marcescens - MIC*    CEFAZOLIN >=64 RESISTANT Resistant     CEFEPIME <=1 SENSITIVE Sensitive     CEFTAZIDIME <=1 SENSITIVE Sensitive     CEFTRIAXONE <=1 SENSITIVE Sensitive     CIPROFLOXACIN <=0.25 SENSITIVE Sensitive     GENTAMICIN <=1 SENSITIVE Sensitive     TRIMETH/SULFA <=20 SENSITIVE Sensitive     * SERRATIA MARCESCENS  Blood Culture ID Panel (Reflexed)     Status: Abnormal   Collection Time: 03/15/16  9:20 AM  Result Value Ref Range Status   Enterococcus species NOT DETECTED NOT DETECTED Final   Listeria monocytogenes NOT DETECTED NOT DETECTED Final     Staphylococcus species NOT DETECTED NOT DETECTED Final   Staphylococcus aureus NOT DETECTED NOT DETECTED Final   Streptococcus species NOT DETECTED NOT DETECTED Final   Streptococcus agalactiae NOT DETECTED NOT DETECTED Final   Streptococcus pneumoniae NOT DETECTED NOT DETECTED Final  Streptococcus pyogenes NOT DETECTED NOT DETECTED Final   Acinetobacter baumannii NOT DETECTED NOT DETECTED Final   Enterobacteriaceae species DETECTED (A) NOT DETECTED Final    Comment: CRITICAL RESULT CALLED TO, READ BACK BY AND VERIFIED WITH: D MERRITT RN [phone] 03/16/16 A BROWNING    Enterobacter cloacae complex NOT DETECTED NOT DETECTED Final   Escherichia coli NOT DETECTED NOT DETECTED Final   Klebsiella oxytoca NOT DETECTED NOT DETECTED Final   Klebsiella pneumoniae NOT DETECTED NOT DETECTED Final   Proteus species NOT DETECTED NOT DETECTED Final   Serratia marcescens DETECTED (A) NOT DETECTED Final    Comment: CRITICAL RESULT CALLED TO, READ BACK BY AND VERIFIED WITH: D MERRITT RN [phone] 03/16/16 A BROWNING    Carbapenem resistance NOT DETECTED NOT DETECTED Final   Haemophilus influenzae NOT DETECTED NOT DETECTED Final   Neisseria meningitidis NOT DETECTED NOT DETECTED Final   Pseudomonas aeruginosa NOT DETECTED NOT DETECTED Final   Candida albicans NOT DETECTED NOT DETECTED Final   Candida glabrata NOT DETECTED NOT DETECTED Final   Candida krusei NOT DETECTED NOT DETECTED Final   Candida parapsilosis NOT DETECTED NOT DETECTED Final   Candida tropicalis NOT DETECTED NOT DETECTED Final    Comment: Performed [facility]  Blood Culture (routine x 2)     Status: None (Preliminary result)   Collection Time: 03/15/16  9:30 AM  Result Value Ref Range Status   Specimen Description [facility]  Final   Special Requests BOTTLES DRAWN AEROBIC AND ANAEROBIC 5CC  Final   Culture   Final    NO GROWTH 4 DAYS Performed at [facility]    Report Status PENDING  Incomplete  Urine culture      Status: Abnormal   Collection Time: 03/15/16  1:00 PM  Result Value Ref Range Status   Specimen Description URINE, RANDOM  Final   Special Requests NONE  Final   Culture (A)  Final    <10,000 COLONIES/mL INSIGNIFICANT GROWTH Performed at [facility]    Report Status 03/17/2016 FINAL  Final  Urine culture     Status: None   Collection Time: 03/15/16  1:00 PM  Result Value Ref Range Status   Specimen Description URINE, CLEAN CATCH  Final   Special Requests NONE  Final   Culture NO GROWTH [facility]   Final   Report Status 03/17/2016 FINAL  Final  MRSA PCR Screening     Status: Abnormal   Collection Time: 03/15/16  6:00 PM  Result Value Ref Range Status   MRSA by PCR POSITIVE (A) NEGATIVE Final    Comment:        The GeneXpert MRSA Assay (FDA approved for NASAL specimens only), is one component of a comprehensive MRSA colonization surveillance program. It is not intended to diagnose MRSA infection nor to guide or monitor treatment for MRSA infections. RESULT CALLED TO, READ BACK BY AND VERIFIED WITH: QUEEN RN AT 2149 ON 03/15/16 BY S.VANHOORNE      Radiological Exams on Admission: No results found.  EKG: Independently reviewed.  Assessment/Plan Principal Problem:   Serratia sepsis (HCC) Active Problems:   Opioid use disorder, severe, dependence (HCC)   Heroin abuse   Symptomatic anemia   Multifocal pneumonia   Bacteremia    1. Serratia bacteremia and sepsis - also has B PNA 1. High grade bacteremia with 2 cultures positive despite treatment with ABx that organism was sensitive to in a 24 hour period!  Highly  worrisome for bacterial endocarditis in this patient with ongoing IVDU and h/o bacterial endocarditis just earlier this year. 2. Will put patient on 2gm rocephin Q24H IV (serratia is sensitive to rocephin). 3. IVF bolus in ED, now patient to be transfused blood for severe anemia. 4. Tele monitor 5. Tylenol for  fever 2. Symptomatic anemia - 1. Transfusing 2 units PRBC 2. No obvious stigmata of GIB 3. Will recheck CBC tomorrow AM 4. Sending stool for occult blood 5. Looks a lot like iron deficiency anemia (probably associated with her profound malnutrition), based on the anemia panel that was obtained 4 days ago at [facility] 3. IVDU, polysubstance abuse - 1. Not currently in withdrawal 2. Use ativan / clonadine for withdrawal if symptoms develop 3. Discussed with patient up front that, like [facility] we are unable to do opiate therapy to treat withdrawal.   DVT prophylaxis: Lovenox Code Status: Full Family Communication: No family in room Consults called: None Admission status: Admit to inpatient   Hillary Bow DO [facility] Pager [phone] from 7PM-7AM  If 7AM-7PM, please contact the day physician for the patient [email or web address] Password [email or web address]  03/20/2016, 12:22 AM

## 2016-03-20 NOTE — Progress Notes (Signed)
CRITICAL VALUE ALERT  Critical value received:  Hgb 6.9  Date of notification:  03/20/2016  Time of notification:  1615  Critical value read back:Yes.    Nurse who received alert:  Valere DrossHeather Davan Hark, RN  MD notified (1st page):  Dr. Lafe GarinGherge  Time of first page:  1617  MD notified (2nd page):  Time of second page:  Responding MD:  Dr. Lafe GarinGherge  Time MD responded:  [phone]

## 2016-03-20 NOTE — Consult Note (Signed)
Date of Admission:  03/19/2016  Date of Consult:  03/20/2016  Reason for Consult: Serratia bacteremia in pt with IVDU and known prior endocarditis Referring Physician: Dr. Cruzita Lederer   HPI: Catherine Hernandez is an 30 y.o. 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 She was being treated with ceftriaxone and Unasyn and was discharged to skilled nursing facility on August 31. She mainly left facility upon arrival, the PICC was removed prior to discharge. She then returned in September with ongoing drug use found again to have a direct coccus in the blood. He again left AGAINST MEDICAL ADVICE on 12/19/2015. Now she is admitted again after being seen in the ER where blood cultures were drawn and have essentially grown Serratia on 2 out of 2 eights. She's been readmitted to the hospitalist service and is currently on ceftriaxone. One of the 2 cultures repeated is picking up a coag negative staph but I believe this is likely a contaminant.  I have little doubt she will leave [place] in a few days yet again. Fortunately this time we can probably treated with a fluoroquinolone if she will ask a bother to take the antibiotic   Past Medical History:  Diagnosis Date  . Anemia   . CAP (community acquired pneumonia) 11/16/2015  . Childhood asthma   . Enterococcal bacteremia   . Heroin abuse   . Hyponatremia   . Pneumonia 2007  . Polysubstance abuse     Past Surgical History:  Procedure Laterality Date  . NO PAST SURGERIES    . TEE WITHOUT CARDIOVERSION N/A 11/23/2015   Procedure: TRANSESOPHAGEAL ECHOCARDIOGRAM (TEE) WITH ANESTHESIA;  Surgeon: Sueanne Margarita, MD;  Location: [facility];  Service: Cardiovascular;  Laterality: N/A;    Social History:  reports that she has been smoking  Cigarettes.  She has a 5.00 pack-year smoking history. She has never used smokeless tobacco. She reports that she uses drugs, including IV, Cocaine, Heroin, and Oxycodone. She reports that she does not drink alcohol.   Family History  Problem Relation Age of Onset  . Anesthesia problems Neg Hx   . Hypotension Neg Hx   . Malignant hyperthermia Neg Hx   . Pseudochol deficiency Neg Hx     No Known Allergies   Medications: I have reviewed patients current medications as documented in Epic Anti-infectives    Start     Dose/Rate Route Frequency Ordered Stop   03/20/16 0600  cefTRIAXone (ROCEPHIN) 2 g in dextrose 5 % 50 mL IVPB     2 g 100 mL/hr over 30 Minutes Intravenous Every 24 hours 03/20/16 0008     03/19/16 2215  ceFEPIme (MAXIPIME) 2 g in dextrose 5 % 50 mL IVPB  Status:  Discontinued     2 g 100 mL/hr over 30 Minutes Intravenous Every 8 hours 03/19/16 2203 03/20/16 0008         ROS:\as in HPI otherwise remainder of 12 point Review of Systems is as a different multiple somatic complaints pain in the back pain in multiple joints poor sleep  Blood pressure 130/69, pulse (!) 129, temperature 100 F (37.8 C), temperature source Oral, resp. rate (!) 49, height '5\' 5"'$  (1.651 m), weight 95 lb 14.4 oz (43.5 kg), SpO2 98 %. General: Alert and awake, oriented x3,  not in any acute distress. HEENT: anicteric sclera,  EOMI, oropharynx clear and without exudate Cardiovascular: tachycardic rate, normal r,  no murmur rubs or gallops Pulmonary: coarse breath sounds Gastrointestinal: soft nontender, nondistended, normal bowel sounds, Musculoskeletal, Skin: track marks, no [place] or [place] Skin, soft tissue: no rashes Neuro: nonfocal, strength and sensation intact   Results for orders placed or performed during the hospital encounter of 03/19/16 (from the past 48 hour(s))  Comprehensive metabolic panel     Status: Abnormal   Collection Time: 03/19/16  8:30 PM  Result Value Ref Range    Sodium 134 (L) 135 - 145 mmol/L   Potassium 3.6 3.5 - 5.1 mmol/L   Chloride 104 101 - 111 mmol/L   CO2 20 (L) 22 - 32 mmol/L   Glucose, Bld 89 65 - 99 mg/dL   BUN 9 6 - 20 mg/dL   Creatinine, Ser 0.75 0.44 - 1.00 mg/dL   Calcium 7.8 (L) 8.9 - 10.3 mg/dL   Total Protein 6.9 6.5 - 8.1 g/dL   Albumin 1.9 (L) 3.5 - 5.0 g/dL   AST 28 15 - 41 U/L   ALT 23 14 - 54 U/L   Alkaline Phosphatase 112 38 - 126 U/L   Total Bilirubin 0.2 (L) 0.3 - 1.2 mg/dL   GFR calc non Af Amer >60 >60 mL/min   GFR calc Af Amer >60 >60 mL/min    Comment: (NOTE) The eGFR has been calculated using the CKD EPI equation. This calculation has not been validated in all clinical situations. eGFR's persistently <60 mL/min signify possible Chronic Kidney Disease.    Anion gap 10 5 - 15  CBC with Differential     Status: Abnormal   Collection Time: 03/19/16  8:30 PM  Result Value Ref Range   WBC 14.4 (H) 4.0 - 10.5 K/uL   RBC 2.06 (L) 3.87 - 5.11 MIL/uL   Hemoglobin 4.4 (LL) 12.0 - 15.0 g/dL    Comment: REPEATED TO VERIFY CRITICAL RESULT CALLED TO, READ BACK BY AND VERIFIED WITH: T.MOORE,PAC 2355 03/19/16 G.MCADOO    HCT 15.5 (L) 36.0 - 46.0 %   MCV 75.2 (L) 78.0 - 100.0 fL   MCH 21.4 (L) 26.0 - 34.0 pg   MCHC 28.4 (L) 30.0 - 36.0 g/dL   RDW 18.5 (H) 11.5 - 15.5 %   Platelets 319 150 - 400 K/uL   Neutrophils Relative % 83 %   Lymphocytes Relative 12 %   Monocytes Relative 5 %   Eosinophils Relative 0 %   Basophils Relative 0 %   Neutro Abs 12.0 (H) 1.7 - 7.7 K/uL   Lymphs Abs 1.7 0.7 - 4.0 K/uL   Monocytes Absolute 0.7 0.1 - 1.0 K/uL   Eosinophils Absolute 0.0 0.0 - 0.7 K/uL   Basophils Absolute 0.0 0.0 - 0.1 K/uL   RBC Morphology POLYCHROMASIA PRESENT     Comment: SPHEROCYTES  Blood Culture (routine x 2)     Status: None (Preliminary result)   Collection Time: 03/19/16  8:30 PM  Result Value Ref Range   Specimen Description BLOOD LEFT ARM    Special Requests BOTTLES DRAWN AEROBIC AND ANAEROBIC 5CC EA     Culture  Setup Time      GRAM NEGATIVE RODS IN BOTH AEROBIC AND ANAEROBIC BOTTLES CRITICAL RESULT CALLED TO, READ BACK BY AND VERIFIED WITH: N BATCHELDER,PHARMD AT 1242 03/20/16 BY L BENFIELD    Culture GRAM NEGATIVE RODS    Report Status PENDING   Urinalysis, Routine  w reflex microscopic     Status: Abnormal   Collection Time: 03/19/16  9:39 PM  Result Value Ref Range   Color, Urine YELLOW YELLOW   APPearance CLEAR CLEAR   Specific Gravity, Urine 1.014 1.005 - 1.030   pH 8.0 5.0 - 8.0   Glucose, UA 50 (A) NEGATIVE mg/dL   Hgb urine dipstick MODERATE (A) NEGATIVE   Bilirubin Urine NEGATIVE NEGATIVE   Ketones, ur NEGATIVE NEGATIVE mg/dL   Protein, ur 30 (A) NEGATIVE mg/dL   Nitrite NEGATIVE NEGATIVE   Leukocytes, UA NEGATIVE NEGATIVE   RBC / HPF 6-30 0 - 5 RBC/hpf   WBC, UA 0-5 0 - 5 WBC/hpf   Bacteria, UA RARE (A) NONE SEEN   Squamous Epithelial / LPF 0-5 (A) NONE SEEN  I-Stat CG4 Lactic Acid, ED     Status: Abnormal   Collection Time: 03/19/16 10:49 PM  Result Value Ref Range   Lactic Acid, Venous 3.12 (HH) 0.5 - 1.9 mmol/L   Comment NOTIFIED PHYSICIAN   Blood Culture (routine x 2)     Status: None (Preliminary result)   Collection Time: 03/19/16 11:00 PM  Result Value Ref Range   Specimen Description BLOOD RIGHT ARM    Special Requests IN PEDIATRIC BOTTLE 3CC    Culture  Setup Time      GRAM NEGATIVE RODS IN PEDIATRIC BOTTLE CRITICAL RESULT CALLED TO, READ BACK BY AND VERIFIED WITH: N BATCHELDER,PHARMD AT 1242 03/20/16 BY L BENFIELD    Culture GRAM NEGATIVE RODS    Report Status PENDING   Type and screen [place]     Status: None (Preliminary result)   Collection Time: 03/19/16 11:00 PM  Result Value Ref Range   ABO/RH(D) O POS    Antibody Screen NEG    Sample Expiration 03/22/2016    Unit Number [id number]    Blood Component Type RBC LR PHER2    Unit division 00    Status of Unit ISSUED    Transfusion Status OK TO TRANSFUSE     Crossmatch Result Compatible    Unit Number [id number]    Blood Component Type RBC LR PHER1    Unit division 00    Status of Unit ISSUED    Transfusion Status OK TO TRANSFUSE    Crossmatch Result Compatible    Unit Number [id number]    Blood Component Type RED CELLS,LR    Unit division 00    Status of Unit ALLOCATED    Transfusion Status OK TO TRANSFUSE    Crossmatch Result Compatible   Blood Culture ID Panel (Reflexed)     Status: Abnormal   Collection Time: 03/19/16 11:00 PM  Result Value Ref Range   Enterococcus species NOT DETECTED NOT DETECTED   Listeria monocytogenes NOT DETECTED NOT DETECTED   Staphylococcus species DETECTED (A) NOT DETECTED    Comment: CRITICAL RESULT CALLED TO, READ BACK BY AND VERIFIED WITH: N BATCHELDER,PHARMD AT 1242 03/20/16 BY L BENFIELD    Staphylococcus aureus NOT DETECTED NOT DETECTED   Methicillin resistance DETECTED (A) NOT DETECTED    Comment: CRITICAL RESULT CALLED TO, READ BACK BY AND VERIFIED WITH: N BATCHELDER,PHARMD AT 1242 03/20/16 BY L BENFIELD    Streptococcus species NOT DETECTED NOT DETECTED   Streptococcus agalactiae NOT DETECTED NOT DETECTED   Streptococcus pneumoniae NOT DETECTED NOT DETECTED   Streptococcus pyogenes NOT DETECTED NOT DETECTED   Acinetobacter baumannii NOT DETECTED NOT DETECTED   Enterobacteriaceae species DETECTED (A) NOT DETECTED  Comment: CRITICAL RESULT CALLED TO, READ BACK BY AND VERIFIED WITH: N BATCHELDER,PHARMD AT 1242 03/20/16 BY L BENFIELD    Enterobacter cloacae complex NOT DETECTED NOT DETECTED   Escherichia coli NOT DETECTED NOT DETECTED   Klebsiella oxytoca NOT DETECTED NOT DETECTED   Klebsiella pneumoniae NOT DETECTED NOT DETECTED   Proteus species NOT DETECTED NOT DETECTED   Serratia marcescens DETECTED (A) NOT DETECTED    Comment: CRITICAL RESULT CALLED TO, READ BACK BY AND VERIFIED WITH: N BATCHELDER,PHARMD AT 1242 03/20/16 BY L BENFIELD    Carbapenem resistance NOT DETECTED NOT  DETECTED   Haemophilus influenzae NOT DETECTED NOT DETECTED   Neisseria meningitidis NOT DETECTED NOT DETECTED   Pseudomonas aeruginosa NOT DETECTED NOT DETECTED   Candida albicans NOT DETECTED NOT DETECTED   Candida glabrata NOT DETECTED NOT DETECTED   Candida krusei NOT DETECTED NOT DETECTED   Candida parapsilosis NOT DETECTED NOT DETECTED   Candida tropicalis NOT DETECTED NOT DETECTED  I-Stat Troponin, ED (not at [facility])     Status: None   Collection Time: 03/19/16 11:08 PM  Result Value Ref Range   Troponin i, poc 0.00 0.00 - 0.08 ng/mL   Comment 3            Comment: Due to the release kinetics of cTnI, a negative result within the first hours of the onset of symptoms does not rule out myocardial infarction with certainty. If myocardial infarction is still suspected, repeat the test at appropriate intervals.   Prepare RBC     Status: None   Collection Time: 03/20/16 12:03 AM  Result Value Ref Range   Order Confirmation ORDER PROCESSED BY BLOOD BANK   I-Stat CG4 Lactic Acid, ED     Status: None   Collection Time: 03/20/16  1:03 AM  Result Value Ref Range   Lactic Acid, Venous 1.53 0.5 - 1.9 mmol/L  MRSA PCR Screening     Status: Abnormal   Collection Time: 03/20/16  3:17 AM  Result Value Ref Range   MRSA by PCR POSITIVE (A) NEGATIVE    Comment:        The GeneXpert MRSA Assay (FDA approved for NASAL specimens only), is one component of a comprehensive MRSA colonization surveillance program. It is not intended to diagnose MRSA infection nor to guide or monitor treatment for MRSA infections. RESULT CALLED TO, READ BACK BY AND VERIFIED WITH: C. SCHILLER 0901 12.18.2017 N. MORRIS   CBC     Status: Abnormal   Collection Time: 03/20/16 11:48 AM  Result Value Ref Range   WBC 12.8 (H) 4.0 - 10.5 K/uL    Comment: REPEATED TO VERIFY   RBC 2.88 (L) 3.87 - 5.11 MIL/uL   Hemoglobin 7.2 (L) 12.0 - 15.0 g/dL    Comment: REPEATED TO VERIFY POST TRANSFUSION SPECIMEN    HCT  22.8 (L) 36.0 - 46.0 %   MCV 79.2 78.0 - 100.0 fL    Comment: POST TRANSFUSION SPECIMEN   MCH 25.0 (L) 26.0 - 34.0 pg   MCHC 31.6 30.0 - 36.0 g/dL   RDW 18.1 (H) 11.5 - 15.5 %   Platelets 268 150 - 400 K/uL  Basic metabolic panel     Status: Abnormal   Collection Time: 03/20/16 11:48 AM  Result Value Ref Range   Sodium 139 135 - 145 mmol/L   Potassium 3.2 (L) 3.5 - 5.1 mmol/L   Chloride 108 101 - 111 mmol/L   CO2 20 (L) 22 - 32 mmol/L   Glucose,  Bld 121 (H) 65 - 99 mg/dL   BUN 6 6 - 20 mg/dL   Creatinine, Ser 0.56 0.44 - 1.00 mg/dL   Calcium 7.5 (L) 8.9 - 10.3 mg/dL   GFR calc non Af Amer >60 >60 mL/min   GFR calc Af Amer >60 >60 mL/min    Comment: (NOTE) The eGFR has been calculated using the CKD EPI equation. This calculation has not been validated in all clinical situations. eGFR's persistently <60 mL/min signify possible Chronic Kidney Disease.    Anion gap 11 5 - 15  Glucose, capillary     Status: Abnormal   Collection Time: 03/20/16 12:01 PM  Result Value Ref Range   Glucose-Capillary 117 (H) 65 - 99 mg/dL  Hemoglobin and hematocrit, blood     Status: Abnormal   Collection Time: 03/20/16  2:56 PM  Result Value Ref Range   Hemoglobin 6.9 (LL) 12.0 - 15.0 g/dL    Comment: REPEATED TO VERIFY CRITICAL RESULT CALLED TO, READ BACK BY AND VERIFIED WITH: STONE,H RN '@1602'$  BY GRINSTEAD,C 12.18.17    HCT 21.4 (L) 36.0 - 46.0 %  Prepare RBC     Status: None   Collection Time: 03/20/16  4:23 PM  Result Value Ref Range   Order Confirmation ORDER PROCESSED BY BLOOD BANK    '@BRIEFLABTABLE'$ (sdes,specrequest,cult,reptstatus)   ) Recent Results (from the past 720 hour(s))  Culture, blood (Routine X 2) w Reflex to ID Panel     Status: Abnormal   Collection Time: 03/14/16 10:05 AM  Result Value Ref Range Status   Specimen Description BLOOD RIGHT FOREARM  Final   Special Requests BOTTLES DRAWN AEROBIC AND ANAEROBIC 5ML  Final   Culture  Setup Time   Final    GRAM NEGATIVE  RODS IN BOTH AEROBIC AND ANAEROBIC BOTTLES CRITICAL RESULT CALLED TO, READ BACK BY AND VERIFIED WITHAvie Echevaria RN [phone] 03/15/16 A BROWNING Performed at [facility] (A)  Final   Report Status 03/17/2016 FINAL  Final   Organism ID, Bacteria SERRATIA MARCESCENS  Final      Susceptibility   Serratia marcescens - MIC*    CEFAZOLIN >=64 RESISTANT Resistant     CEFEPIME <=1 SENSITIVE Sensitive     CEFTAZIDIME <=1 SENSITIVE Sensitive     CEFTRIAXONE <=1 SENSITIVE Sensitive     CIPROFLOXACIN <=0.25 SENSITIVE Sensitive     GENTAMICIN <=1 SENSITIVE Sensitive     TRIMETH/SULFA <=20 SENSITIVE Sensitive     * SERRATIA MARCESCENS  Blood Culture ID Panel (Reflexed)     Status: Abnormal   Collection Time: 03/14/16 10:05 AM  Result Value Ref Range Status   Enterococcus species NOT DETECTED NOT DETECTED Final   Listeria monocytogenes NOT DETECTED NOT DETECTED Final   Staphylococcus species NOT DETECTED NOT DETECTED Final   Staphylococcus aureus NOT DETECTED NOT DETECTED Final   Streptococcus species NOT DETECTED NOT DETECTED Final   Streptococcus agalactiae NOT DETECTED NOT DETECTED Final   Streptococcus pneumoniae NOT DETECTED NOT DETECTED Final   Streptococcus pyogenes NOT DETECTED NOT DETECTED Final   Acinetobacter baumannii NOT DETECTED NOT DETECTED Final   Enterobacteriaceae species DETECTED (A) NOT DETECTED Final    Comment: CRITICAL RESULT CALLED TO, READ BACK BY AND VERIFIED WITH: L ADKINS RN [phone] 03/15/16 A BROWNING    Enterobacter cloacae complex NOT DETECTED NOT DETECTED Final   Escherichia coli NOT DETECTED NOT DETECTED Final   Klebsiella oxytoca NOT DETECTED NOT DETECTED Final   Klebsiella pneumoniae  NOT DETECTED NOT DETECTED Final   Proteus species NOT DETECTED NOT DETECTED Final   Serratia marcescens DETECTED (A) NOT DETECTED Final    Comment: CRITICAL RESULT CALLED TO, READ BACK BY AND VERIFIED WITH: L ADKINS RN [phone] 03/15/16 A BROWNING     Carbapenem resistance NOT DETECTED NOT DETECTED Final   Haemophilus influenzae NOT DETECTED NOT DETECTED Final   Neisseria meningitidis NOT DETECTED NOT DETECTED Final   Pseudomonas aeruginosa NOT DETECTED NOT DETECTED Final   Candida albicans NOT DETECTED NOT DETECTED Final   Candida glabrata NOT DETECTED NOT DETECTED Final   Candida krusei NOT DETECTED NOT DETECTED Final   Candida parapsilosis NOT DETECTED NOT DETECTED Final   Candida tropicalis NOT DETECTED NOT DETECTED Final    Comment: Performed at [facility]  Blood Culture (routine x 2)     Status: Abnormal   Collection Time: 03/15/16  9:20 AM  Result Value Ref Range Status   Specimen Description BLOOD LEFT HAND  Final   Special Requests IN PEDIATRIC BOTTLE 4CC  Final   Culture  Setup Time   Final    GRAM NEGATIVE RODS AEROBIC BOTTLE ONLY CRITICAL RESULT CALLED TO, READ BACK BY AND VERIFIED WITH: D MERRITT RN [phone] 03/16/16 A BROWNING Performed at [place] (A)  Final   Report Status 03/18/2016 FINAL  Final   Organism ID, Bacteria SERRATIA MARCESCENS  Final      Susceptibility   Serratia marcescens - MIC*    CEFAZOLIN >=64 RESISTANT Resistant     CEFEPIME <=1 SENSITIVE Sensitive     CEFTAZIDIME <=1 SENSITIVE Sensitive     CEFTRIAXONE <=1 SENSITIVE Sensitive     CIPROFLOXACIN <=0.25 SENSITIVE Sensitive     GENTAMICIN <=1 SENSITIVE Sensitive     TRIMETH/SULFA <=20 SENSITIVE Sensitive     * SERRATIA MARCESCENS  Blood Culture ID Panel (Reflexed)     Status: Abnormal   Collection Time: 03/15/16  9:20 AM  Result Value Ref Range Status   Enterococcus species NOT DETECTED NOT DETECTED Final   Listeria monocytogenes NOT DETECTED NOT DETECTED Final   Staphylococcus species NOT DETECTED NOT DETECTED Final   Staphylococcus aureus NOT DETECTED NOT DETECTED Final   Streptococcus species NOT DETECTED NOT DETECTED Final   Streptococcus agalactiae NOT DETECTED NOT DETECTED Final    Streptococcus pneumoniae NOT DETECTED NOT DETECTED Final   Streptococcus pyogenes NOT DETECTED NOT DETECTED Final   Acinetobacter baumannii NOT DETECTED NOT DETECTED Final   Enterobacteriaceae species DETECTED (A) NOT DETECTED Final    Comment: CRITICAL RESULT CALLED TO, READ BACK BY AND VERIFIED WITH: D MERRITT RN [phone] 03/16/16 A BROWNING    Enterobacter cloacae complex NOT DETECTED NOT DETECTED Final   Escherichia coli NOT DETECTED NOT DETECTED Final   Klebsiella oxytoca NOT DETECTED NOT DETECTED Final   Klebsiella pneumoniae NOT DETECTED NOT DETECTED Final   Proteus species NOT DETECTED NOT DETECTED Final   Serratia marcescens DETECTED (A) NOT DETECTED Final    Comment: CRITICAL RESULT CALLED TO, READ BACK BY AND VERIFIED WITH: D MERRITT RN 1740 03/16/16 A BROWNING    Carbapenem resistance NOT DETECTED NOT DETECTED Final   Haemophilus influenzae NOT DETECTED NOT DETECTED Final   Neisseria meningitidis NOT DETECTED NOT DETECTED Final   Pseudomonas aeruginosa NOT DETECTED NOT DETECTED Final   Candida albicans NOT DETECTED NOT DETECTED Final   Candida glabrata NOT DETECTED NOT DETECTED Final   Candida krusei NOT DETECTED NOT DETECTED Final  Candida parapsilosis NOT DETECTED NOT DETECTED Final   Candida tropicalis NOT DETECTED NOT DETECTED Final    Comment: Performed at [facility]  Blood Culture (routine x 2)     Status: None   Collection Time: 03/15/16  9:30 AM  Result Value Ref Range Status   Specimen Description BLOOD RT [facility]  Final   Special Requests BOTTLES DRAWN AEROBIC AND ANAEROBIC 5CC  Final   Culture   Final    NO GROWTH 5 DAYS Performed at [facility]    Report Status 03/20/2016 FINAL  Final  Urine culture     Status: Abnormal   Collection Time: 03/15/16  1:00 PM  Result Value Ref Range Status   Specimen Description URINE, RANDOM  Final   Special Requests NONE  Final   Culture (A)  Final    <10,000 COLONIES/mL INSIGNIFICANT GROWTH Performed at  [facility]    Report Status 03/17/2016 FINAL  Final  Urine culture     Status: None   Collection Time: 03/15/16  1:00 PM  Result Value Ref Range Status   Specimen Description URINE, CLEAN CATCH  Final   Special Requests NONE  Final   Culture NO GROWTH Performed at [facility]   Final   Report Status 03/17/2016 FINAL  Final  MRSA PCR Screening     Status: Abnormal   Collection Time: 03/15/16  6:00 PM  Result Value Ref Range Status   MRSA by PCR POSITIVE (A) NEGATIVE Final    Comment:        The GeneXpert MRSA Assay (FDA approved for NASAL specimens only), is one component of a comprehensive MRSA colonization surveillance program. It is not intended to diagnose MRSA infection nor to guide or monitor treatment for MRSA infections. RESULT CALLED TO, READ BACK BY AND VERIFIED WITH: QUEEN RN AT 2149 ON 03/15/16 BY S.VANHOORNE   Blood Culture (routine x 2)     Status: None (Preliminary result)   Collection Time: 03/19/16  8:30 PM  Result Value Ref Range Status   Specimen Description BLOOD LEFT ARM  Final   Special Requests BOTTLES DRAWN AEROBIC AND ANAEROBIC 5CC EA  Final   Culture  Setup Time   Final    GRAM NEGATIVE RODS IN BOTH AEROBIC AND ANAEROBIC BOTTLES CRITICAL RESULT CALLED TO, READ BACK BY AND VERIFIED WITH: N BATCHELDER,PHARMD AT 1242 03/20/16 BY L BENFIELD    Culture GRAM NEGATIVE RODS  Final   Report Status PENDING  Incomplete  Blood Culture (routine x 2)     Status: None (Preliminary result)   Collection Time: 03/19/16 11:00 PM  Result Value Ref Range Status   Specimen Description BLOOD RIGHT ARM  Final   Special Requests IN PEDIATRIC BOTTLE 3CC  Final   Culture  Setup Time   Final    GRAM NEGATIVE RODS IN PEDIATRIC BOTTLE CRITICAL RESULT CALLED TO, READ BACK BY AND VERIFIED WITH: N BATCHELDER,PHARMD AT 1242 03/20/16 BY L BENFIELD    Culture GRAM NEGATIVE RODS  Final   Report Status PENDING  Incomplete  Blood Culture ID Panel (Reflexed)      Status: Abnormal   Collection Time: 03/19/16 11:00 PM  Result Value Ref Range Status   Enterococcus species NOT DETECTED NOT DETECTED Final   Listeria monocytogenes NOT DETECTED NOT DETECTED Final   Staphylococcus species DETECTED (A) NOT DETECTED Final    Comment: CRITICAL RESULT CALLED TO, READ BACK BY AND VERIFIED WITH: N BATCHELDER,PHARMD AT 1242 03/20/16 BY  L BENFIELD    Staphylococcus aureus NOT DETECTED NOT DETECTED Final   Methicillin resistance DETECTED (A) NOT DETECTED Final    Comment: CRITICAL RESULT CALLED TO, READ BACK BY AND VERIFIED WITH: N BATCHELDER,PHARMD AT 1242 03/20/16 BY L BENFIELD    Streptococcus species NOT DETECTED NOT DETECTED Final   Streptococcus agalactiae NOT DETECTED NOT DETECTED Final   Streptococcus pneumoniae NOT DETECTED NOT DETECTED Final   Streptococcus pyogenes NOT DETECTED NOT DETECTED Final   Acinetobacter baumannii NOT DETECTED NOT DETECTED Final   Enterobacteriaceae species DETECTED (A) NOT DETECTED Final    Comment: CRITICAL RESULT CALLED TO, READ BACK BY AND VERIFIED WITH: N BATCHELDER,PHARMD AT 1242 03/20/16 BY L BENFIELD    Enterobacter cloacae complex NOT DETECTED NOT DETECTED Final   Escherichia coli NOT DETECTED NOT DETECTED Final   Klebsiella oxytoca NOT DETECTED NOT DETECTED Final   Klebsiella pneumoniae NOT DETECTED NOT DETECTED Final   Proteus species NOT DETECTED NOT DETECTED Final   Serratia marcescens DETECTED (A) NOT DETECTED Final    Comment: CRITICAL RESULT CALLED TO, READ BACK BY AND VERIFIED WITH: N BATCHELDER,PHARMD AT 1242 03/20/16 BY L BENFIELD    Carbapenem resistance NOT DETECTED NOT DETECTED Final   Haemophilus influenzae NOT DETECTED NOT DETECTED Final   Neisseria meningitidis NOT DETECTED NOT DETECTED Final   Pseudomonas aeruginosa NOT DETECTED NOT DETECTED Final   Candida albicans NOT DETECTED NOT DETECTED Final   Candida glabrata NOT DETECTED NOT DETECTED Final   Candida krusei NOT DETECTED NOT DETECTED  Final   Candida parapsilosis NOT DETECTED NOT DETECTED Final   Candida tropicalis NOT DETECTED NOT DETECTED Final  MRSA PCR Screening     Status: Abnormal   Collection Time: 03/20/16  3:17 AM  Result Value Ref Range Status   MRSA by PCR POSITIVE (A) NEGATIVE Final    Comment:        The GeneXpert MRSA Assay (FDA approved for NASAL specimens only), is one component of a comprehensive MRSA colonization surveillance program. It is not intended to diagnose MRSA infection nor to guide or monitor treatment for MRSA infections. RESULT CALLED TO, READ BACK BY AND VERIFIED WITH: C. SCHILLER 0901 12.18.2017 N. MORRIS      Impression/Recommendation  Principal Problem:   Serratia sepsis ([place]) Active Problems:   Opioid use disorder, severe, dependence (HCC)   Heroin abuse   Symptomatic anemia   Multifocal pneumonia   Bacteremia   Catherine Hernandez is a 30 y.o. 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  #1 Serratia bacteremia:   She certainly could have right-sided endocarditis again and we could get a 2-D echocardiogram  I would not place any PICC line  Continue ceftriaxone 2 g daily for now and when she decides to leave [place] with [facility] home on levofloxacin 750 mg daily 1 month  #2 ongoing IV drug use: Check for HIV hep B and hep C she needs intensive care therapy but I don't see our going to get her engaged it is only a matter of time before she leaves again I'm afraid     03/20/2016, 6:33 PM   Thank you so much for this interesting consult  [place] for [place] (pager) [phone] (office) 03/20/2016, 6:33 PM  [place] 03/20/2016, 6:33 PM

## 2016-03-20 NOTE — Care Management Note (Signed)
Case Management Note  Patient Details  Name: Catherine Hernandez MRN: [id number] Date of Birth: 03/22/1986  Subjective/Objective:    Presents with Serratia Bacteremia and Sepsis, Bil Pna, has had recent Endocarditis, she is a IV Drug User.  She [place], she is a frequent flyer, she has received 2 units of prbc's,  If she needs long term IV abx, she will need to go to a SNF or try to do oritavancin. Patient states she does not have insurance and she will need ast with meds and transportation. NCM will cont to follow for dc needs.                 Action/Plan:   Expected Discharge Date:  03/22/16               Expected Discharge Plan:  Home/Self Care  In-House Referral:     Discharge planning Services  CM Consult  Post Acute Care Choice:    Choice offered to:     DME Arranged:    DME Agency:     HH Arranged:    HH Agency:     Status of Service:  In process, will continue to follow  If discussed at Long Length of Stay Meetings, dates discussed:    Additional Comments:  Leone Havenaylor, Maevis Mumby [place], RN 03/20/2016, 12:05 PM

## 2016-03-20 NOTE — Progress Notes (Signed)
  PHARMACY - PHYSICIAN COMMUNICATION CRITICAL VALUE ALERT - BLOOD CULTURE IDENTIFICATION (BCID)  Results for orders placed or performed during the hospital encounter of 03/19/16  Blood Culture ID Panel (Reflexed) (Collected: 03/19/2016 11:00 PM)  Result Value Ref Range   Enterococcus species NOT DETECTED NOT DETECTED   Listeria monocytogenes NOT DETECTED NOT DETECTED   Staphylococcus species DETECTED (A) NOT DETECTED   Staphylococcus aureus NOT DETECTED NOT DETECTED   Methicillin resistance DETECTED (A) NOT DETECTED   Streptococcus species NOT DETECTED NOT DETECTED   Streptococcus agalactiae NOT DETECTED NOT DETECTED   Streptococcus pneumoniae NOT DETECTED NOT DETECTED   Streptococcus pyogenes NOT DETECTED NOT DETECTED   Acinetobacter baumannii NOT DETECTED NOT DETECTED   Enterobacteriaceae species DETECTED (A) NOT DETECTED   Enterobacter cloacae complex NOT DETECTED NOT DETECTED   Escherichia coli NOT DETECTED NOT DETECTED   Klebsiella oxytoca NOT DETECTED NOT DETECTED   Klebsiella pneumoniae NOT DETECTED NOT DETECTED   Proteus species NOT DETECTED NOT DETECTED   Serratia marcescens DETECTED (A) NOT DETECTED   Carbapenem resistance NOT DETECTED NOT DETECTED   Haemophilus influenzae NOT DETECTED NOT DETECTED   Neisseria meningitidis NOT DETECTED NOT DETECTED   Pseudomonas aeruginosa NOT DETECTED NOT DETECTED   Candida albicans NOT DETECTED NOT DETECTED   Candida glabrata NOT DETECTED NOT DETECTED   Candida krusei NOT DETECTED NOT DETECTED   Candida parapsilosis NOT DETECTED NOT DETECTED   Candida tropicalis NOT DETECTED NOT DETECTED    Name of physician (or Provider) Contacted: C. Gherghe  BCID detected serration again. Most likely same bug from the 12/13 cx. BCID also picked up staph species with mec A gene but that is most likely contaminant. These are repeat cultures that remain positive despite being on a supposed susceptible abx.  ID has been consulted and will see. Worrisome  for disseminated infection and possible endocarditis.   Changes to prescribed antibiotics required: No changes needed. Continue ceftriaxone and follow up susceptibilities. Follow up ID recs  Catherine Hernandez, PharmD, BCPS Clinical Pharmacist Pager [phone] 03/20/2016 1:03 PM

## 2016-03-20 NOTE — ED Notes (Signed)
Pt refused blood work,  States wait til in the morning she is tired.

## 2016-03-21 ENCOUNTER — Inpatient Hospital Stay (HOSPITAL_COMMUNITY): Payer: Medicaid Other

## 2016-03-21 DIAGNOSIS — R509 Fever, unspecified: Secondary | ICD-10-CM

## 2016-03-21 DIAGNOSIS — R7881 Bacteremia: Secondary | ICD-10-CM

## 2016-03-21 LAB — TYPE AND SCREEN
ABO/RH(D): O POS
Antibody Screen: NEGATIVE
UNIT DIVISION: 0
UNIT DIVISION: 0
Unit division: 0

## 2016-03-21 LAB — BASIC METABOLIC PANEL
ANION GAP: 10 (ref 5–15)
BUN: 5 mg/dL — ABNORMAL LOW (ref 6–20)
CHLORIDE: 105 mmol/L (ref 101–111)
CO2: 19 mmol/L — ABNORMAL LOW (ref 22–32)
Calcium: 7.6 mg/dL — ABNORMAL LOW (ref 8.9–10.3)
Creatinine, Ser: 0.54 mg/dL (ref 0.44–1.00)
Glucose, Bld: 106 mg/dL — ABNORMAL HIGH (ref 65–99)
POTASSIUM: 3.5 mmol/L (ref 3.5–5.1)
SODIUM: 134 mmol/L — AB (ref 135–145)

## 2016-03-21 LAB — CBC
HEMATOCRIT: 24.7 % — AB (ref 36.0–46.0)
HEMOGLOBIN: 7.8 g/dL — AB (ref 12.0–15.0)
MCH: 25.2 pg — ABNORMAL LOW (ref 26.0–34.0)
MCHC: 31.6 g/dL (ref 30.0–36.0)
MCV: 79.9 fL (ref 78.0–100.0)
Platelets: 254 10*3/uL (ref 150–400)
RBC: 3.09 MIL/uL — AB (ref 3.87–5.11)
RDW: 18.4 % — AB (ref 11.5–15.5)
WBC: 14.8 10*3/uL — AB (ref 4.0–10.5)

## 2016-03-21 LAB — PARASITE EXAM SCREEN, BLOOD-W CONF TO LABCORP (NOT @ ARMC)

## 2016-03-21 LAB — ECHOCARDIOGRAM COMPLETE
Height: 65 in
WEIGHTICAEL: 1534.4 [oz_av]

## 2016-03-21 LAB — URINE CULTURE

## 2016-03-21 LAB — HIV ANTIBODY (ROUTINE TESTING W REFLEX): HIV Screen 4th Generation wRfx: NONREACTIVE

## 2016-03-21 MED ORDER — ONDANSETRON HCL 4 MG/2ML IJ SOLN
4.0000 mg | Freq: Four times a day (QID) | INTRAMUSCULAR | Status: DC | PRN
  Administered 2016-03-21 – 2016-03-22 (×2): 4 mg via INTRAVENOUS
  Filled 2016-03-21 (×2): qty 2

## 2016-03-21 NOTE — Progress Notes (Signed)
  Echocardiogram 2D Echocardiogram has been performed.  Nolon RodBrown, Tony 03/21/2016, 4:07 PM

## 2016-03-21 NOTE — Progress Notes (Signed)
PROGRESS NOTE  Catherine Hernandez ZOX:[id number] DOB: [birth year] DOA: 03/19/2016 PCP: No PCP Per Patient   LOS: 1 day   Brief Narrative: 30 y.o. female with medical history significant of IVDU that is ongoing with heroin, the emergency room on 12/18 after being called to have positive blood cultures. She has a history of bacteremia and endocarditis as well as pneumonia due to septic emboli and she was in the hospital in September 2017, however left AMA prior to finishing her IV antibiotics. She presented to the ER on 12/12 with shortness of breath, [facility] was asked for admission however patient left AMA due to not receiving narcotic pain medications. She had blood cultures at that time which turned positive for Serratia and she was called to come back.  Assessment & Plan: Principal Problem:   Serratia sepsis (HCC) Active Problems:   Opioid use disorder, severe, dependence (HCC)   Heroin abuse   Symptomatic anemia   Multifocal pneumonia   Septicemia (HCC)   Septic embolism (HCC)   Sepsis with serratia bacteremia, multifocal pneumonia with cavitation / septic emboli - Infectious disease consulted, discussed with Dr. Algis Liming this morning, appreciate input - Patient with long-standing history of IV drug use, as well as noncompliance with medical treatment and left AMA several times in the past - Continue IV antibiotics, she will likely need a prolonged course per ID, will likely need placement as well, not a candidate for home PICC  - Still febrile and tachycardic today, remain in step down   Endocarditis - Most recent TTE in August 2017 showed "2 large, very mobile vegetations are seen on the TV leaflets, the larger of which, prolapses through the tricuspid valve with at least mild TR. The largest vegetation measures 2.63 x 1.2 cm in diameter and very shaggy." - Since she did not finish treatment then, likely she has ongoing endocarditis. - In August 2017 she had blood cultures with  Enterococcus faecalis as well as MSSA, will likely need treatment for those, defer antibiotics to ID - 2d echo pending   Drug abuse - counseled, discussed that she will not get any narcotic medication in the hospital, place on CIWA for withdrawal and use tramadol for pain  Symptomatic anemia - Likely from ongoing smoldering sepsis, transfused with goal hemoglobin above 7, fecal occult is pending. Hemoglobin last week was 5.7 when she left AMA - Improved with transfusion, no evidence of bleeding, continue to monitor  Protein calorie malnutrition    DVT prophylaxis: SCD Code Status: Full code Family Communication: no family bedside Disposition Plan: TBD  Consultants:   ID  Procedures:   None   Antimicrobials:  Ceftriaxone 12/18 >>   Subjective: - Complains of nausea and is asking me what happens if she cannot take by mouth medications if she has nausea, however breakfast is in front of her and is finished  Objective: Vitals:   03/21/16 0354 03/21/16 0400 03/21/16 0804 03/21/16 1221  BP: (!) 149/78  131/79 122/74  Pulse: (!) 118 (!) 117 (!) 121 (!) 116  Resp: (!) 35 (!) 30 (!) 27 (!) 36  Temp: 100 F (37.8 C)  (!) 101.4 F (38.6 C) 98.9 F (37.2 C)  TempSrc: Oral  Oral Oral  SpO2: 96% 96% 96% 98%  Weight:      Height:        Intake/Output Summary (Last 24 hours) at 03/21/16 1325 Last data filed at 03/21/16 0551  Gross per 24 hour  Intake  1025 ml  Output                0 ml  Net             1025 ml   Filed Weights   03/20/16 0000 03/20/16 0235  Weight: 43.1 kg (95 lb 0.3 oz) 43.5 kg (95 lb 14.4 oz)    Examination: Constitutional: ill appearing  Vitals:   03/21/16 0354 03/21/16 0400 03/21/16 0804 03/21/16 1221  BP: (!) 149/78  131/79 122/74  Pulse: (!) 118 (!) 117 (!) 121 (!) 116  Resp: (!) 35 (!) 30 (!) 27 (!) 36  Temp: 100 F (37.8 C)  (!) 101.4 F (38.6 C) 98.9 F (37.2 C)  TempSrc: Oral  Oral Oral  SpO2: 96% 96% 96% 98%  Weight:       Height:       Eyes: PERRL Respiratory: coarse breath sounds, no wheezing   Cardiovascular: Regular rate and rhythm, soft SEM Abdomen: no tenderness. Bowel sounds positive.  Musculoskeletal: no clubbing / cyanosis.  Skin: no rashes, lesions, ulcers. No induration Neurologic: non focal   Data Reviewed: I have personally reviewed following labs and imaging studies  CBC:  Recent Labs Lab 03/15/16 0935 03/19/16 2030 03/20/16 1148 03/20/16 1456 03/21/16 0513  WBC 9.2 14.4* 12.8*  --  14.8*  NEUTROABS 7.8* 12.0*  --   --   --   HGB 5.7* 4.4* 7.2* 6.9* 7.8*  HCT 19.6* 15.5* 22.8* 21.4* 24.7*  MCV 79.4 75.2* 79.2  --  79.9  PLT 246 319 268  --  254   Basic Metabolic Panel:  Recent Labs Lab 03/15/16 0935 03/19/16 2030 03/20/16 1148 03/21/16 0513  NA 136 134* 139 134*  K 2.9* 3.6 3.2* 3.5  CL 103 104 108 105  CO2 24 20* 20* 19*  GLUCOSE 184* 89 121* 106*  BUN 11 9 6  5*  CREATININE 0.54 0.75 0.56 0.54  CALCIUM 8.0* 7.8* 7.5* 7.6*   GFR: Estimated Creatinine Clearance: 71.9 mL/min (by C-G formula based on SCr of 0.54 mg/dL). Liver Function Tests:  Recent Labs Lab 03/15/16 0935 03/19/16 2030  AST 23 28  ALT 11* 23  ALKPHOS 84 112  BILITOT 0.5 0.2*  PROT 7.4 6.9  ALBUMIN 2.2* 1.9*   No results for input(s): LIPASE, AMYLASE in the last 168 hours. No results for input(s): AMMONIA in the last 168 hours. Coagulation Profile: No results for input(s): INR, PROTIME in the last 168 hours. Cardiac Enzymes:  Recent Labs Lab 03/15/16 0935  TROPONINI <0.03   BNP (last 3 results) No results for input(s): PROBNP in the last 8760 hours. HbA1C: No results for input(s): HGBA1C in the last 72 hours. CBG:  Recent Labs Lab 03/20/16 0906 03/20/16 1201  GLUCAP 117* 117*   Lipid Profile: No results for input(s): CHOL, HDL, LDLCALC, TRIG, CHOLHDL, LDLDIRECT in the last 72 hours. Thyroid Function Tests: No results for input(s): TSH, T4TOTAL, FREET4, T3FREE, THYROIDAB  in the last 72 hours. Anemia Panel: No results for input(s): VITAMINB12, FOLATE, FERRITIN, TIBC, IRON, RETICCTPCT in the last 72 hours. Urine analysis:    Component Value Date/Time   COLORURINE YELLOW 03/19/2016 2139   APPEARANCEUR CLEAR 03/19/2016 2139   LABSPEC 1.014 03/19/2016 2139   PHURINE 8.0 03/19/2016 2139   GLUCOSEU 50 (A) 03/19/2016 2139   HGBUR MODERATE (A) 03/19/2016 2139   BILIRUBINUR NEGATIVE 03/19/2016 2139   KETONESUR NEGATIVE 03/19/2016 2139   PROTEINUR 30 (A) 03/19/2016 2139   UROBILINOGEN 0.2  01/07/2012 1140   NITRITE NEGATIVE 03/19/2016 2139   LEUKOCYTESUR NEGATIVE 03/19/2016 2139   Sepsis Labs: Invalid input(s): PROCALCITONIN, LACTICIDVEN  Recent Results (from the past 240 hour(s))  Culture, blood (Routine X 2) w Reflex to ID Panel     Status: Abnormal   Collection Time: 03/14/16 10:05 AM  Result Value Ref Range Status   Specimen Description BLOOD RIGHT FOREARM  Final   Special Requests BOTTLES DRAWN AEROBIC AND ANAEROBIC  Final   Culture  Setup Time   Final    GRAM NEGATIVE RODS IN BOTH AEROBIC AND ANAEROBIC BOTTLES CRITICAL RESULT CALLED TO, READ BACK BY AND VERIFIED WITHKermit Balo RN [phone] 03/15/16 A BROWNING Performed at [facility]    Culture SERRATIA MARCESCENS (A)  Final   Report Status 03/17/2016 FINAL  Final   Organism ID, Bacteria SERRATIA MARCESCENS  Final      Susceptibility   Serratia marcescens - MIC*    CEFAZOLIN >=64 RESISTANT Resistant     CEFEPIME <=1 SENSITIVE Sensitive     CEFTAZIDIME <=1 SENSITIVE Sensitive     CEFTRIAXONE <=1 SENSITIVE Sensitive     CIPROFLOXACIN <=0.25 SENSITIVE Sensitive     GENTAMICIN <=1 SENSITIVE Sensitive     TRIMETH/SULFA <=20 SENSITIVE Sensitive     * SERRATIA MARCESCENS  Blood Culture ID Panel (Reflexed)     Status: Abnormal   Collection Time: 03/14/16 10:05 AM  Result Value Ref Range Status   Enterococcus species NOT DETECTED NOT DETECTED Final   Listeria monocytogenes NOT DETECTED NOT  DETECTED Final   Staphylococcus species NOT DETECTED NOT DETECTED Final   Staphylococcus aureus NOT DETECTED NOT DETECTED Final   Streptococcus species NOT DETECTED NOT DETECTED Final   Streptococcus agalactiae NOT DETECTED NOT DETECTED Final   Streptococcus pneumoniae NOT DETECTED NOT DETECTED Final   Streptococcus pyogenes NOT DETECTED NOT DETECTED Final   Acinetobacter baumannii NOT DETECTED NOT DETECTED Final   Enterobacteriaceae species DETECTED (A) NOT DETECTED Final    Comment: CRITICAL RESULT CALLED TO, READ BACK BY AND VERIFIED WITH: L ADKINS RN [phone] 03/15/16 A BROWNING    Enterobacter cloacae complex NOT DETECTED NOT DETECTED Final   Escherichia coli NOT DETECTED NOT DETECTED Final   Klebsiella oxytoca NOT DETECTED NOT DETECTED Final   Klebsiella pneumoniae NOT DETECTED NOT DETECTED Final   Proteus species NOT DETECTED NOT DETECTED Final   Serratia marcescens DETECTED (A) NOT DETECTED Final    Comment: CRITICAL RESULT CALLED TO, READ BACK BY AND VERIFIED WITH: L ADKINS RN [phone] 03/15/16 A BROWNING    Carbapenem resistance NOT DETECTED NOT DETECTED Final   Haemophilus influenzae NOT DETECTED NOT DETECTED Final   Neisseria meningitidis NOT DETECTED NOT DETECTED Final   Pseudomonas aeruginosa NOT DETECTED NOT DETECTED Final   Candida albicans NOT DETECTED NOT DETECTED Final   Candida glabrata NOT DETECTED NOT DETECTED Final   Candida krusei NOT DETECTED NOT DETECTED Final   Candida parapsilosis NOT DETECTED NOT DETECTED Final   Candida tropicalis NOT DETECTED NOT DETECTED Final    Comment: Performed at [facility]  Blood Culture (routine x 2)     Status: Abnormal   Collection Time: 03/15/16  9:20 AM  Result Value Ref Range Status   Specimen Description BLOOD LEFT HAND  Final   Special Requests IN PEDIATRIC BOTTLE 4CC  Final   Culture  Setup Time   Final    GRAM NEGATIVE RODS AEROBIC BOTTLE ONLY CRITICAL RESULT CALLED TO, READ BACK BY AND  VERIFIED WITH: D MERRITT  RN [phone] 03/16/16 A BROWNING Performed [facility]    Culture SERRATIA MARCESCENS (A)  Final   Report Status 03/18/2016 FINAL  Final   Organism ID, Bacteria SERRATIA MARCESCENS  Final      Susceptibility   Serratia marcescens - MIC*    CEFAZOLIN >=64 RESISTANT Resistant     CEFEPIME <=1 SENSITIVE Sensitive     CEFTAZIDIME <=1 SENSITIVE Sensitive     CEFTRIAXONE <=1 SENSITIVE Sensitive     CIPROFLOXACIN <=0.25 SENSITIVE Sensitive     GENTAMICIN <=1 SENSITIVE Sensitive     TRIMETH/SULFA <=20 SENSITIVE Sensitive     * SERRATIA MARCESCENS  Blood Culture ID Panel (Reflexed)     Status: Abnormal   Collection Time: 03/15/16  9:20 AM  Result Value Ref Range Status   Enterococcus species NOT DETECTED NOT DETECTED Final   Listeria monocytogenes NOT DETECTED NOT DETECTED Final   Staphylococcus species NOT DETECTED NOT DETECTED Final   Staphylococcus aureus NOT DETECTED NOT DETECTED Final   Streptococcus species NOT DETECTED NOT DETECTED Final   Streptococcus agalactiae NOT DETECTED NOT DETECTED Final   Streptococcus pneumoniae NOT DETECTED NOT DETECTED Final   Streptococcus pyogenes NOT DETECTED NOT DETECTED Final   Acinetobacter baumannii NOT DETECTED NOT DETECTED Final   Enterobacteriaceae species DETECTED (A) NOT DETECTED Final    Comment: CRITICAL RESULT CALLED TO, READ BACK BY AND VERIFIED WITH: D MERRITT RN [phone] 03/16/16 A BROWNING    Enterobacter cloacae complex NOT DETECTED NOT DETECTED Final   Escherichia coli NOT DETECTED NOT DETECTED Final   Klebsiella oxytoca NOT DETECTED NOT DETECTED Final   Klebsiella pneumoniae NOT DETECTED NOT DETECTED Final   Proteus species NOT DETECTED NOT DETECTED Final   Serratia marcescens DETECTED (A) NOT DETECTED Final    Comment: CRITICAL RESULT CALLED TO, READ BACK BY AND VERIFIED WITH: D MERRITT RN [phone] 03/16/16 A BROWNING    Carbapenem resistance NOT DETECTED NOT DETECTED Final   Haemophilus influenzae NOT DETECTED NOT DETECTED  Final   Neisseria meningitidis NOT DETECTED NOT DETECTED Final   Pseudomonas aeruginosa NOT DETECTED NOT DETECTED Final   Candida albicans NOT DETECTED NOT DETECTED Final   Candida glabrata NOT DETECTED NOT DETECTED Final   Candida krusei NOT DETECTED NOT DETECTED Final   Candida parapsilosis NOT DETECTED NOT DETECTED Final   Candida tropicalis NOT DETECTED NOT DETECTED Final    Comment: Performed at [facility]  Blood Culture (routine x 2)     Status: None   Collection Time: 03/15/16  9:30 AM  Result Value Ref Range Status   Specimen Description BLOOD RT [facility]  Final   Special Requests BOTTLES DRAWN AEROBIC AND ANAEROBIC 5CC  Final   Culture   Final    NO GROWTH 5 DAYS Performed at [facility]    Report Status 03/20/2016 FINAL  Final  Urine culture     Status: Abnormal   Collection Time: 03/15/16  1:00 PM  Result Value Ref Range Status   Specimen Description URINE, RANDOM  Final   Special Requests NONE  Final   Culture (A)  Final    <10,000 COLONIES/mL INSIGNIFICANT GROWTH Performed at [facility]    Report Status 03/17/2016 FINAL  Final  Urine culture     Status: None   Collection Time: 03/15/16  1:00 PM  Result Value Ref Range Status   Specimen Description URINE, CLEAN CATCH  Final   Special Requests NONE  Final  Culture NO GROWTH Performed [facility]    Final   Report Status 03/17/2016 FINAL  Final  MRSA PCR Screening     Status: Abnormal   Collection Time: 03/15/16  6:00 PM  Result Value Ref Range Status   MRSA by PCR POSITIVE (A) NEGATIVE Final    Comment:        The GeneXpert MRSA Assay (FDA approved for NASAL specimens only), is one component of a comprehensive MRSA colonization surveillance program. It is not intended to diagnose MRSA infection nor to guide or monitor treatment for MRSA infections. RESULT CALLED TO, READ BACK BY AND VERIFIED WITH: QUEEN RN AT 2149 ON 03/15/16 BY S.VANHOORNE   Blood Culture (routine x 2)      Status: None (Preliminary result)   Collection Time: 03/19/16  8:30 PM  Result Value Ref Range Status   Specimen Description BLOOD LEFT ARM  Final   Special Requests BOTTLES DRAWN AEROBIC AND ANAEROBIC 5CC EA  Final   Culture  Setup Time   Final    GRAM NEGATIVE RODS IN BOTH AEROBIC AND ANAEROBIC BOTTLES CRITICAL RESULT CALLED TO, READ BACK BY AND VERIFIED WITH: N BATCHELDER,PHARMD AT 1242 03/20/16 BY L BENFIELD    Culture GRAM NEGATIVE RODS  Final   Report Status PENDING  Incomplete  Urine culture     Status: Abnormal   Collection Time: 03/19/16  9:39 PM  Result Value Ref Range Status   Specimen Description URINE, RANDOM  Final   Special Requests NONE  Final   Culture MULTIPLE SPECIES PRESENT, SUGGEST RECOLLECTION (A)  Final   Report Status 03/21/2016 FINAL  Final  Blood Culture (routine x 2)     Status: None (Preliminary result)   Collection Time: 03/19/16 11:00 PM  Result Value Ref Range Status   Specimen Description BLOOD RIGHT ARM  Final   Special Requests IN PEDIATRIC BOTTLE 3CC  Final   Culture  Setup Time   Final    GRAM NEGATIVE RODS IN PEDIATRIC BOTTLE CRITICAL RESULT CALLED TO, READ BACK BY AND VERIFIED WITH: N BATCHELDER,PHARMD AT 1242 03/20/16 BY L BENFIELD    Culture GRAM NEGATIVE RODS  Final   Report Status PENDING  Incomplete  Blood Culture ID Panel (Reflexed)     Status: Abnormal   Collection Time: 03/19/16 11:00 PM  Result Value Ref Range Status   Enterococcus species NOT DETECTED NOT DETECTED Final   Listeria monocytogenes NOT DETECTED NOT DETECTED Final   Staphylococcus species DETECTED (A) NOT DETECTED Final    Comment: CRITICAL RESULT CALLED TO, READ BACK BY AND VERIFIED WITH: N BATCHELDER,PHARMD AT 1242 03/20/16 BY L BENFIELD    Staphylococcus aureus NOT DETECTED NOT DETECTED Final   Methicillin resistance DETECTED (A) NOT DETECTED Final    Comment: CRITICAL RESULT CALLED TO, READ BACK BY AND VERIFIED WITH: N BATCHELDER,PHARMD AT 1242 03/20/16 BY L  BENFIELD    Streptococcus species NOT DETECTED NOT DETECTED Final   Streptococcus agalactiae NOT DETECTED NOT DETECTED Final   Streptococcus pneumoniae NOT DETECTED NOT DETECTED Final   Streptococcus pyogenes NOT DETECTED NOT DETECTED Final   Acinetobacter baumannii NOT DETECTED NOT DETECTED Final   Enterobacteriaceae species DETECTED (A) NOT DETECTED Final    Comment: CRITICAL RESULT CALLED TO, READ BACK BY AND VERIFIED WITH: N BATCHELDER,PHARMD AT 1242 03/20/16 BY L BENFIELD    Enterobacter cloacae complex NOT DETECTED NOT DETECTED Final   Escherichia coli NOT DETECTED NOT DETECTED Final   Klebsiella oxytoca NOT DETECTED NOT DETECTED  Final   Klebsiella pneumoniae NOT DETECTED NOT DETECTED Final   Proteus species NOT DETECTED NOT DETECTED Final   Serratia marcescens DETECTED (A) NOT DETECTED Final    Comment: CRITICAL RESULT CALLED TO, READ BACK BY AND VERIFIED WITH: N BATCHELDER,PHARMD AT 1242 03/20/16 BY L BENFIELD    Carbapenem resistance NOT DETECTED NOT DETECTED Final   Haemophilus influenzae NOT DETECTED NOT DETECTED Final   Neisseria meningitidis NOT DETECTED NOT DETECTED Final   Pseudomonas aeruginosa NOT DETECTED NOT DETECTED Final   Candida albicans NOT DETECTED NOT DETECTED Final   Candida glabrata NOT DETECTED NOT DETECTED Final   Candida krusei NOT DETECTED NOT DETECTED Final   Candida parapsilosis NOT DETECTED NOT DETECTED Final   Candida tropicalis NOT DETECTED NOT DETECTED Final  MRSA PCR Screening     Status: Abnormal   Collection Time: 03/20/16  3:17 AM  Result Value Ref Range Status   MRSA by PCR POSITIVE (A) NEGATIVE Final    Comment:        The GeneXpert MRSA Assay (FDA approved for NASAL specimens only), is one component of a comprehensive MRSA colonization surveillance program. It is not intended to diagnose MRSA infection nor to guide or monitor treatment for MRSA infections. RESULT CALLED TO, READ BACK BY AND VERIFIED WITH: C. SCHILLER 0901  12.18.2017 N. MORRIS   Culture, blood (single)     Status: None (Preliminary result)   Collection Time: 03/20/16 11:33 PM  Result Value Ref Range Status   Specimen Description BLOOD RIGHT ARM  Final   Special Requests IN PEDIATRIC BOTTLE 2CC  Final   Culture PENDING  Incomplete   Report Status PENDING  Incomplete      Radiology Studies: Dg Chest Port 1 View  Result Date: 03/20/2016 CLINICAL DATA:  Chest pain, shortness of breath, cough for 2 weeks. History of pneumonia and blood infection. EXAM: PORTABLE CHEST 1 VIEW COMPARISON:  Chest radiograph March 15, 2016. FINDINGS: Cardiomediastinal silhouette is normal. Central lucencies within LEFT midlung zone airspace opacity. Patchy additional airspace opacities with cavitation. Trace LEFT pleural effusion. No pneumothorax. Soft tissue planes and included osseous structures are nonsuspicious. IMPRESSION: Increasing cavitation of multifocal pneumonia. Trace LEFT pleural effusion. Electronically Signed   By: Awilda Metro M.D.   On: 03/20/2016 00:25     Scheduled Meds: . cefTRIAXone (ROCEPHIN)  IV  2 g Intravenous Q24H  . Chlorhexidine Gluconate Cloth  6 each Topical Q0600  . folic acid  1 mg Oral Daily  . multivitamin with minerals  1 tablet Oral Daily  . mupirocin ointment  1 application Nasal BID  . thiamine  100 mg Oral Daily   Or  . thiamine  100 mg Intravenous Daily   Continuous Infusions:   Pamella Pert, MD, PhD [facility] Pager [phone]  If 7PM-7AM, please contact night-coverage [email or web address] Password [email or web address] 03/21/2016, 1:25 PM

## 2016-03-21 NOTE — Progress Notes (Signed)
Subjective:  Catherine Hernandez is less tired today still feeling a bit short of breath   Antibiotics:  Anti-infectives    Start     Dose/Rate Route Frequency Ordered Stop   03/20/16 0600  cefTRIAXone (ROCEPHIN) 2 g in dextrose 5 % 50 mL IVPB     2 g 100 mL/hr over 30 Minutes Intravenous Every 24 hours 03/20/16 0008     03/19/16 2215  ceFEPIme (MAXIPIME) 2 g in dextrose 5 % 50 mL IVPB  Status:  Discontinued     2 g 100 mL/hr over 30 Minutes Intravenous Every 8 hours 03/19/16 2203 03/20/16 0008      Medications: Scheduled Meds: . cefTRIAXone (ROCEPHIN)  IV  2 g Intravenous Q24H  . Chlorhexidine Gluconate Cloth  6 each Topical Q0600  . folic acid  1 mg Oral Daily  . multivitamin with minerals  1 tablet Oral Daily  . mupirocin ointment  1 application Nasal BID  . thiamine  100 mg Oral Daily   Or  . thiamine  100 mg Intravenous Daily   Continuous Infusions: PRN Meds:.acetaminophen, LORazepam **OR** LORazepam, traMADol    Objective: Weight change:   Intake/Output Summary (Last 24 hours) at 03/21/16 1239 Last data filed at 03/21/16 0551  Gross per 24 hour  Intake             1025 ml  Output                0 ml  Net             1025 ml   Blood pressure 122/74, pulse (!) 116, temperature 98.9 F (37.2 C), temperature source Oral, resp. rate (!) 36, height 5\' 5"  (1.651 m), weight 95 lb 14.4 oz (43.5 kg), SpO2 98 %. Temp:  [98.8 F (37.1 C)-101.4 F (38.6 C)] 98.9 F (37.2 C) (12/19 1221) Pulse Rate:  [94-129] 116 (12/19 1221) Resp:  [10-49] 36 (12/19 1221) BP: (119-149)/(69-82) 122/74 (12/19 1221) SpO2:  [96 %-100 %] 98 % (12/19 1221)  Physical Exam: General: Alert and awake, oriented x3, not in any acute distress. HEENT: anicteric sclera,  EOMI, oropharynx clear and without exudate Cardiovascular: tachycardic rate, normal r,  no murmur rubs or gallops Pulmonary: coarse breath sounds Gastrointestinal: soft nontender, nondistended, normal bowel  sounds, Musculoskeletal, Skin: track marks, no [place] or [place] Skin, soft tissue: no rashes Neuro: nonfocal, strength and sensation intact  CBC: CBC Latest Ref Rng & Units 03/21/2016 03/20/2016 03/20/2016  WBC 4.0 - 10.5 K/uL 14.8(H) - 12.8(H)  Hemoglobin 12.0 - 15.0 g/dL 7.8(L) 6.9(LL) 7.2(L)  Hematocrit 36.0 - 46.0 % 24.7(L) 21.4(L) 22.8(L)  Platelets 150 - 400 K/uL 254 - 268      BMET  Recent Labs  03/20/16 1148 03/21/16 0513  NA 139 134*  K 3.2* 3.5  CL 108 105  CO2 20* 19*  GLUCOSE 121* 106*  BUN 6 5*  CREATININE 0.56 0.54  CALCIUM 7.5* 7.6*     Liver Panel   Recent Labs  03/19/16 2030  PROT 6.9  ALBUMIN 1.9*  AST 28  ALT 23  ALKPHOS 112  BILITOT 0.2*       Sedimentation Rate No results for input(s): ESRSEDRATE in the last 72 hours. C-Reactive Protein No results for input(s): CRP in the last 72 hours.  Micro Results: Recent Results (from the past 720 hour(s))  Culture, blood (Routine X 2) w Reflex to ID Panel     Status: Abnormal  Collection Time: 03/14/16 10:05 AM  Result Value Ref Range Status   Specimen Description BLOOD RIGHT FOREARM  Final   Special Requests BOTTLES DRAWN AEROBIC AND ANAEROBIC  Final   Culture  Setup Time   Final    GRAM NEGATIVE RODS IN BOTH AEROBIC AND ANAEROBIC BOTTLES CRITICAL RESULT CALLED TO, READ BACK BY AND VERIFIED WITH: Kermit Balo RN [phone] 03/15/16 A BROWNING Performed at [facility]    Culture SERRATIA MARCESCENS (A)  Final   Report Status 03/17/2016 FINAL  Final   Organism ID, Bacteria SERRATIA MARCESCENS  Final      Susceptibility   Serratia marcescens - MIC*    CEFAZOLIN >=64 RESISTANT Resistant     CEFEPIME <=1 SENSITIVE Sensitive     CEFTAZIDIME <=1 SENSITIVE Sensitive     CEFTRIAXONE <=1 SENSITIVE Sensitive     CIPROFLOXACIN <=0.25 SENSITIVE Sensitive     GENTAMICIN <=1 SENSITIVE Sensitive     TRIMETH/SULFA <=20 SENSITIVE Sensitive     * SERRATIA MARCESCENS  Blood Culture ID Panel  (Reflexed)     Status: Abnormal   Collection Time: 03/14/16 10:05 AM  Result Value Ref Range Status   Enterococcus species NOT DETECTED NOT DETECTED Final   Listeria monocytogenes NOT DETECTED NOT DETECTED Final   Staphylococcus species NOT DETECTED NOT DETECTED Final   Staphylococcus aureus NOT DETECTED NOT DETECTED Final   Streptococcus species NOT DETECTED NOT DETECTED Final   Streptococcus agalactiae NOT DETECTED NOT DETECTED Final   Streptococcus pneumoniae NOT DETECTED NOT DETECTED Final   Streptococcus pyogenes NOT DETECTED NOT DETECTED Final   Acinetobacter baumannii NOT DETECTED NOT DETECTED Final   Enterobacteriaceae species DETECTED (A) NOT DETECTED Final    Comment: CRITICAL RESULT CALLED TO, READ BACK BY AND VERIFIED WITH: L ADKINS RN [phone] 03/15/16 A BROWNING    Enterobacter cloacae complex NOT DETECTED NOT DETECTED Final   Escherichia coli NOT DETECTED NOT DETECTED Final   Klebsiella oxytoca NOT DETECTED NOT DETECTED Final   Klebsiella pneumoniae NOT DETECTED NOT DETECTED Final   Proteus species NOT DETECTED NOT DETECTED Final   Serratia marcescens DETECTED (A) NOT DETECTED Final    Comment: CRITICAL RESULT CALLED TO, READ BACK BY AND VERIFIED WITH: L ADKINS RN [phone] 03/15/16 A BROWNING    Carbapenem resistance NOT DETECTED NOT DETECTED Final   Haemophilus influenzae NOT DETECTED NOT DETECTED Final   Neisseria meningitidis NOT DETECTED NOT DETECTED Final   Pseudomonas aeruginosa NOT DETECTED NOT DETECTED Final   Candida albicans NOT DETECTED NOT DETECTED Final   Candida glabrata NOT DETECTED NOT DETECTED Final   Candida krusei NOT DETECTED NOT DETECTED Final   Candida parapsilosis NOT DETECTED NOT DETECTED Final   Candida tropicalis NOT DETECTED NOT DETECTED Final    Comment: Performed at [facility]  Blood Culture (routine x 2)     Status: Abnormal   Collection Time: 03/15/16  9:20 AM  Result Value Ref Range Status   Specimen Description BLOOD LEFT HAND   Final   Special Requests IN PEDIATRIC BOTTLE 4CC  Final   Culture  Setup Time   Final    GRAM NEGATIVE RODS AEROBIC BOTTLE ONLY CRITICAL RESULT CALLED TO, READ BACK BY AND VERIFIED WITHAlgis Downs MERRITT RN [id number] 03/16/16 A BROWNING Performed at [facility]    Culture SERRATIA MARCESCENS (A)  Final   Report Status 03/18/2016 FINAL  Final   Organism ID, Bacteria SERRATIA MARCESCENS  Final      Susceptibility  Serratia marcescens - MIC*    CEFAZOLIN >=64 RESISTANT Resistant     CEFEPIME <=1 SENSITIVE Sensitive     CEFTAZIDIME <=1 SENSITIVE Sensitive     CEFTRIAXONE <=1 SENSITIVE Sensitive     CIPROFLOXACIN <=0.25 SENSITIVE Sensitive     GENTAMICIN <=1 SENSITIVE Sensitive     TRIMETH/SULFA <=20 SENSITIVE Sensitive     * SERRATIA MARCESCENS  Blood Culture ID Panel (Reflexed)     Status: Abnormal   Collection Time: 03/15/16  9:20 AM  Result Value Ref Range Status   Enterococcus species NOT DETECTED NOT DETECTED Final   Listeria monocytogenes NOT DETECTED NOT DETECTED Final   Staphylococcus species NOT DETECTED NOT DETECTED Final   Staphylococcus aureus NOT DETECTED NOT DETECTED Final   Streptococcus species NOT DETECTED NOT DETECTED Final   Streptococcus agalactiae NOT DETECTED NOT DETECTED Final   Streptococcus pneumoniae NOT DETECTED NOT DETECTED Final   Streptococcus pyogenes NOT DETECTED NOT DETECTED Final   Acinetobacter baumannii NOT DETECTED NOT DETECTED Final   Enterobacteriaceae species DETECTED (A) NOT DETECTED Final    Comment: CRITICAL RESULT CALLED TO, READ BACK BY AND VERIFIED WITH: D MERRITT RN [phone] 03/16/16 A BROWNING    Enterobacter cloacae complex NOT DETECTED NOT DETECTED Final   Escherichia coli NOT DETECTED NOT DETECTED Final   Klebsiella oxytoca NOT DETECTED NOT DETECTED Final   Klebsiella pneumoniae NOT DETECTED NOT DETECTED Final   Proteus species NOT DETECTED NOT DETECTED Final   Serratia marcescens DETECTED (A) NOT DETECTED Final    Comment: CRITICAL  RESULT CALLED TO, READ BACK BY AND VERIFIED WITH: D MERRITT RN [phone] 03/16/16 A BROWNING    Carbapenem resistance NOT DETECTED NOT DETECTED Final   Haemophilus influenzae NOT DETECTED NOT DETECTED Final   Neisseria meningitidis NOT DETECTED NOT DETECTED Final   Pseudomonas aeruginosa NOT DETECTED NOT DETECTED Final   Candida albicans NOT DETECTED NOT DETECTED Final   Candida glabrata NOT DETECTED NOT DETECTED Final   Candida krusei NOT DETECTED NOT DETECTED Final   Candida parapsilosis NOT DETECTED NOT DETECTED Final   Candida tropicalis NOT DETECTED NOT DETECTED Final    Comment: Performed at [facility]  Blood Culture (routine x 2)     Status: None   Collection Time: 03/15/16  9:30 AM  Result Value Ref Range Status   Specimen Description BLOOD RT [facility]  Final   Special Requests BOTTLES DRAWN AEROBIC AND ANAEROBIC 5CC  Final   Culture   Final    NO GROWTH 5 DAYS Performed at [facility]    Report Status 03/20/2016 FINAL  Final  Urine culture     Status: Abnormal   Collection Time: 03/15/16  1:00 PM  Result Value Ref Range Status   Specimen Description URINE, RANDOM  Final   Special Requests NONE  Final   Culture (A)  Final    <10,000 COLONIES/mL INSIGNIFICANT GROWTH Performed at [facility]    Report Status 03/17/2016 FINAL  Final  Urine culture     Status: None   Collection Time: 03/15/16  1:00 PM  Result Value Ref Range Status   Specimen Description URINE, CLEAN CATCH  Final   Special Requests NONE  Final   Culture NO GROWTH Performed at [facility]   Final   Report Status 03/17/2016 FINAL  Final  MRSA PCR Screening     Status: Abnormal   Collection Time: 03/15/16  6:00 PM  Result Value Ref Range Status   MRSA by PCR  POSITIVE (A) NEGATIVE Final    Comment:        The GeneXpert MRSA Assay (FDA approved for NASAL specimens only), is one component of a comprehensive MRSA colonization surveillance program. It is not intended to diagnose  MRSA infection nor to guide or monitor treatment for MRSA infections. RESULT CALLED TO, READ BACK BY AND VERIFIED WITH: QUEEN RN AT 2149 ON 03/15/16 BY S.VANHOORNE   Blood Culture (routine x 2)     Status: None (Preliminary result)   Collection Time: 03/19/16  8:30 PM  Result Value Ref Range Status   Specimen Description BLOOD LEFT ARM  Final   Special Requests BOTTLES DRAWN AEROBIC AND ANAEROBIC 5CC EA  Final   Culture  Setup Time   Final    GRAM NEGATIVE RODS IN BOTH AEROBIC AND ANAEROBIC BOTTLES CRITICAL RESULT CALLED TO, READ BACK BY AND VERIFIED WITH: N BATCHELDER,PHARMD AT 1242 03/20/16 BY L BENFIELD    Culture GRAM NEGATIVE RODS  Final   Report Status PENDING  Incomplete  Urine culture     Status: Abnormal   Collection Time: 03/19/16  9:39 PM  Result Value Ref Range Status   Specimen Description URINE, RANDOM  Final   Special Requests NONE  Final   Culture MULTIPLE SPECIES PRESENT, SUGGEST RECOLLECTION (A)  Final   Report Status 03/21/2016 FINAL  Final  Blood Culture (routine x 2)     Status: None (Preliminary result)   Collection Time: 03/19/16 11:00 PM  Result Value Ref Range Status   Specimen Description BLOOD RIGHT ARM  Final   Special Requests IN PEDIATRIC BOTTLE 3CC  Final   Culture  Setup Time   Final    GRAM NEGATIVE RODS IN PEDIATRIC BOTTLE CRITICAL RESULT CALLED TO, READ BACK BY AND VERIFIED WITH: N BATCHELDER,PHARMD AT 1242 03/20/16 BY L BENFIELD    Culture GRAM NEGATIVE RODS  Final   Report Status PENDING  Incomplete  Blood Culture ID Panel (Reflexed)     Status: Abnormal   Collection Time: 03/19/16 11:00 PM  Result Value Ref Range Status   Enterococcus species NOT DETECTED NOT DETECTED Final   Listeria monocytogenes NOT DETECTED NOT DETECTED Final   Staphylococcus species DETECTED (A) NOT DETECTED Final    Comment: CRITICAL RESULT CALLED TO, READ BACK BY AND VERIFIED WITH: N BATCHELDER,PHARMD AT 1242 03/20/16 BY L BENFIELD    Staphylococcus aureus  NOT DETECTED NOT DETECTED Final   Methicillin resistance DETECTED (A) NOT DETECTED Final    Comment: CRITICAL RESULT CALLED TO, READ BACK BY AND VERIFIED WITH: N BATCHELDER,PHARMD AT 1242 03/20/16 BY L BENFIELD    Streptococcus species NOT DETECTED NOT DETECTED Final   Streptococcus agalactiae NOT DETECTED NOT DETECTED Final   Streptococcus pneumoniae NOT DETECTED NOT DETECTED Final   Streptococcus pyogenes NOT DETECTED NOT DETECTED Final   Acinetobacter baumannii NOT DETECTED NOT DETECTED Final   Enterobacteriaceae species DETECTED (A) NOT DETECTED Final    Comment: CRITICAL RESULT CALLED TO, READ BACK BY AND VERIFIED WITH: N BATCHELDER,PHARMD AT 1242 03/20/16 BY L BENFIELD    Enterobacter cloacae complex NOT DETECTED NOT DETECTED Final   Escherichia coli NOT DETECTED NOT DETECTED Final   Klebsiella oxytoca NOT DETECTED NOT DETECTED Final   Klebsiella pneumoniae NOT DETECTED NOT DETECTED Final   Proteus species NOT DETECTED NOT DETECTED Final   Serratia marcescens DETECTED (A) NOT DETECTED Final    Comment: CRITICAL RESULT CALLED TO, READ BACK BY AND VERIFIED WITH: N BATCHELDER,PHARMD AT 1242 03/20/16 BY  L BENFIELD    Carbapenem resistance NOT DETECTED NOT DETECTED Final   Haemophilus influenzae NOT DETECTED NOT DETECTED Final   Neisseria meningitidis NOT DETECTED NOT DETECTED Final   Pseudomonas aeruginosa NOT DETECTED NOT DETECTED Final   Candida albicans NOT DETECTED NOT DETECTED Final   Candida glabrata NOT DETECTED NOT DETECTED Final   Candida krusei NOT DETECTED NOT DETECTED Final   Candida parapsilosis NOT DETECTED NOT DETECTED Final   Candida tropicalis NOT DETECTED NOT DETECTED Final  MRSA PCR Screening     Status: Abnormal   Collection Time: 03/20/16  3:17 AM  Result Value Ref Range Status   MRSA by PCR POSITIVE (A) NEGATIVE Final    Comment:        The GeneXpert MRSA Assay (FDA approved for NASAL specimens only), is one component of a comprehensive MRSA  colonization surveillance program. It is not intended to diagnose MRSA infection nor to guide or monitor treatment for MRSA infections. RESULT CALLED TO, READ BACK BY AND VERIFIED WITH: C. SCHILLER 0901 12.18.2017 N. MORRIS   Culture, blood (single)     Status: None (Preliminary result)   Collection Time: 03/20/16 11:33 PM  Result Value Ref Range Status   Specimen Description BLOOD RIGHT ARM  Final   Special Requests IN PEDIATRIC BOTTLE [facility]  Final   Culture PENDING  Incomplete   Report Status PENDING  Incomplete    Studies/Results: Dg Chest Port 1 View  Result Date: 03/20/2016 CLINICAL DATA:  Chest pain, shortness of breath, cough for 2 weeks. History of pneumonia and blood infection. EXAM: PORTABLE CHEST 1 VIEW COMPARISON:  Chest radiograph March 15, 2016. FINDINGS: Cardiomediastinal silhouette is normal. Central lucencies within LEFT midlung zone airspace opacity. Patchy additional airspace opacities with cavitation. Trace LEFT pleural effusion. No pneumothorax. Soft tissue planes and included osseous structures are nonsuspicious. IMPRESSION: Increasing cavitation of multifocal pneumonia. Trace LEFT pleural effusion. Electronically Signed   By: Awilda Metro M.D.   On: 03/20/2016 00:25      Assessment/Plan:  INTERVAL HISTORY:   GNR growing on repeat cx 2/2   Principal Problem:   Serratia sepsis (HCC) Active Problems:   Opioid use disorder, severe, dependence (HCC)   Heroin abuse   Symptomatic anemia   Multifocal pneumonia   Septicemia (HCC)   Septic embolism (HCC)    Catherine Hernandez is a 31 y.o. female with  30 y.o. 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  #1 Serratia bacteremia:   She certainly could have right-sided endocarditis again and we  could get a 2-D echocardiogram which I have ordered.   Repeat blood cultures  I would not place any PICC line  Continue ceftriaxone 2 g daily for now and when she decides to leave AGAINST MEDICAL ADVICE with Center home on levofloxacin 750 mg daily 1 month  #2 ongoing IV drug use: Check for HIV hep B and hep C  I will follow her peripherally and then sign off once the repeat cultures have shown the same organism as before.   LOS: 1 day   Acey Lav 03/21/2016, 12:39 PM

## 2016-03-22 ENCOUNTER — Inpatient Hospital Stay (HOSPITAL_COMMUNITY): Payer: Medicaid Other

## 2016-03-22 DIAGNOSIS — F1120 Opioid dependence, uncomplicated: Secondary | ICD-10-CM

## 2016-03-22 DIAGNOSIS — Z9119 Patient's noncompliance with other medical treatment and regimen: Secondary | ICD-10-CM

## 2016-03-22 DIAGNOSIS — Z8679 Personal history of other diseases of the circulatory system: Secondary | ICD-10-CM

## 2016-03-22 DIAGNOSIS — F17210 Nicotine dependence, cigarettes, uncomplicated: Secondary | ICD-10-CM

## 2016-03-22 DIAGNOSIS — F1110 Opioid abuse, uncomplicated: Secondary | ICD-10-CM

## 2016-03-22 DIAGNOSIS — B9689 Other specified bacterial agents as the cause of diseases classified elsewhere: Secondary | ICD-10-CM

## 2016-03-22 DIAGNOSIS — F1420 Cocaine dependence, uncomplicated: Secondary | ICD-10-CM

## 2016-03-22 DIAGNOSIS — I071 Rheumatic tricuspid insufficiency: Secondary | ICD-10-CM

## 2016-03-22 DIAGNOSIS — I368 Other nonrheumatic tricuspid valve disorders: Secondary | ICD-10-CM

## 2016-03-22 DIAGNOSIS — I330 Acute and subacute infective endocarditis: Secondary | ICD-10-CM

## 2016-03-22 DIAGNOSIS — A4153 Sepsis due to Serratia: Secondary | ICD-10-CM

## 2016-03-22 LAB — COMPREHENSIVE METABOLIC PANEL
ALBUMIN: 1.7 g/dL — AB (ref 3.5–5.0)
ALK PHOS: 98 U/L (ref 38–126)
ALT: 22 U/L (ref 14–54)
AST: 28 U/L (ref 15–41)
Anion gap: 5 (ref 5–15)
BILIRUBIN TOTAL: 0.6 mg/dL (ref 0.3–1.2)
BUN: 7 mg/dL (ref 6–20)
CALCIUM: 7.8 mg/dL — AB (ref 8.9–10.3)
CO2: 27 mmol/L (ref 22–32)
Chloride: 103 mmol/L (ref 101–111)
Creatinine, Ser: 0.55 mg/dL (ref 0.44–1.00)
GFR calc Af Amer: >60 mL/min (ref >60)
GFR calc non Af Amer: >60 mL/min (ref >60)
GLUCOSE: 117 mg/dL — AB (ref 65–99)
Potassium: 3.4 mmol/L — ABNORMAL LOW (ref 3.5–5.1)
Sodium: 135 mmol/L (ref 135–145)
TOTAL PROTEIN: 6.6 g/dL (ref 6.5–8.1)

## 2016-03-22 LAB — CBC
HEMATOCRIT: 24.5 % — AB (ref 36.0–46.0)
HEMOGLOBIN: 7.8 g/dL — AB (ref 12.0–15.0)
MCH: 26.6 pg (ref 26.0–34.0)
MCHC: 31.8 g/dL (ref 30.0–36.0)
MCV: 83.6 fL (ref 78.0–100.0)
Platelets: 198 10*3/uL (ref 150–400)
RBC: 2.93 MIL/uL — ABNORMAL LOW (ref 3.87–5.11)
RDW: 19.6 % — AB (ref 11.5–15.5)
WBC: 11.2 10*3/uL — AB (ref 4.0–10.5)

## 2016-03-22 LAB — HEPATITIS B SURFACE ANTIGEN: Hepatitis B Surface Ag: NEGATIVE

## 2016-03-22 LAB — HEPATITIS C ANTIBODY (REFLEX): HCV Ab: 0.7 s/co ratio (ref 0.0–0.9)

## 2016-03-22 LAB — HCV COMMENT:

## 2016-03-22 MED ORDER — CIPROFLOXACIN HCL 500 MG PO TABS
500.0000 mg | ORAL_TABLET | Freq: Two times a day (BID) | ORAL | Status: DC
  Administered 2016-03-23 – 2016-03-28 (×11): 500 mg via ORAL
  Filled 2016-03-22 (×11): qty 1

## 2016-03-22 MED ORDER — IOPAMIDOL (ISOVUE-370) INJECTION 76%
INTRAVENOUS | Status: AC
  Filled 2016-03-22: qty 100

## 2016-03-22 MED ORDER — POTASSIUM CHLORIDE IN NACL 20-0.9 MEQ/L-% IV SOLN
INTRAVENOUS | Status: DC
  Administered 2016-03-22 – 2016-03-23 (×2): via INTRAVENOUS
  Filled 2016-03-22 (×2): qty 1000

## 2016-03-22 MED ORDER — POTASSIUM CHLORIDE CRYS ER 20 MEQ PO TBCR
40.0000 meq | EXTENDED_RELEASE_TABLET | Freq: Once | ORAL | Status: AC
  Administered 2016-03-22: 40 meq via ORAL
  Filled 2016-03-22: qty 2

## 2016-03-22 MED ORDER — SODIUM CHLORIDE 0.9 % IV BOLUS (SEPSIS)
500.0000 mL | Freq: Once | INTRAVENOUS | Status: AC
  Administered 2016-03-22: 500 mL via INTRAVENOUS

## 2016-03-22 NOTE — Progress Notes (Signed)
PROGRESS NOTE  Catherine Hernandez ZOX:[id number] DOB: [birth year] DOA: 03/19/2016 PCP: No PCP Per Patient   LOS: 2 days   Brief Narrative: 30 y.o. female with medical history significant of IVDU that is ongoing with heroin, the emergency room on 12/18 after being called to have positive blood cultures. She has a history of bacteremia and endocarditis as well as pneumonia due to septic emboli and she was in the hospital in September 2017, however left AMA prior to finishing her IV antibiotics. She presented to the ER on 12/12 with shortness of breath, [facility] was asked for admission however patient left AMA due to not receiving narcotic pain medications. She had blood cultures at that time which turned positive for Serratia and she was called to come back.  Assessment & Plan: Principal Problem:   Serratia sepsis (HCC) Active Problems:   Opioid use disorder, severe, dependence (HCC)   Heroin abuse   Symptomatic anemia   Multifocal pneumonia   Septicemia (HCC)   Septic embolism (HCC)   Sepsis with serratia bacteremia, multifocal pneumonia with cavitation / septic emboli - Infectious disease consulted, discussed with Dr. Algis Liming this morning, appreciate input - Patient with long-standing history of IV drug use, as well as noncompliance with medical treatment and left AMA several times in the past - Continue IV antibiotics, she will likely need a prolonged course per ID, will likely need placement as well, not a candidate for home PICC  Concern for endocarditis-large   1 cm mobile mass noted on the anterior tricuspid leaflet with   severe tricuspid regurgitation concerning for vegetation, CTS consult? Otherwise plan is PO levaquin for 4 weeks per ID  CT chest to r/o PE , cavitary PNA , empyema    Endocarditis - Most recent TTE in August 2017 showed "2 large, very mobile vegetations are seen on the TV leaflets, the larger of which, prolapses through the tricuspid valve with at least mild TR. 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 - In August 2017 she had blood cultures with Enterococcus faecalis as well as MSSA, will likely need treatment for those, defer antibiotics to ID - 2d echo 12/19 with results as above  Drug abuse - counseled, discussed that she will not get any narcotic medication in the hospital, place on CIWA for withdrawal and use tramadol for pain  Symptomatic anemia - Likely from ongoing smoldering sepsis, transfused with goal hemoglobin above 7, fecal occult is pending. Hemoglobin last week was 5.7 when she left AMA - Improved with transfusion, no evidence of bleeding, continue to monitor  Protein calorie malnutrition  Albumin 1.7   DVT prophylaxis: SCD Code Status: Full code Family Communication: no family bedside Disposition Plan: TBD  Consultants:   ID  CTS   Procedures:   None   Antimicrobials:  Ceftriaxone 12/18 >>   Subjective:    Objective: Vitals:   03/21/16 1938 03/21/16 2313 03/22/16 0414 03/22/16 0828  BP: 128/69 103/61 115/71 99/60  Pulse: (!) 125 (!) 110 (!) 115 (!) 111  Resp: (!) 34  (!) 33 (!) 30  Temp: (!) 102 F (38.9 C) 99 F (37.2 C) 98.9 F (37.2 C) 99.8 F (37.7 C)  TempSrc: Oral Oral Oral Oral  SpO2: 100% 98% 98% 96%  Weight:      Height:       No intake or output data in the 24 hours  ending 03/22/16 0913 Filed Weights   03/20/16 0000 03/20/16 0235  Weight: 43.1 kg (95 lb 0.3 oz) 43.5 kg (95 lb 14.4 oz)    Examination: Constitutional: ill appearing  Vitals:   03/21/16 1938 03/21/16 2313 03/22/16 0414 03/22/16 0828  BP: 128/69 103/61 115/71 99/60  Pulse: (!) 125 (!) 110 (!) 115 (!) 111  Resp: (!) 34  (!) 33 (!) 30  Temp: (!) 102 F (38.9 C) 99 F (37.2 C) 98.9 F (37.2 C) 99.8 F (37.7 C)  TempSrc: Oral Oral Oral Oral  SpO2: 100% 98% 98% 96%  Weight:      Height:       Eyes:  PERRL Respiratory: coarse breath sounds, no wheezing   Cardiovascular: Regular rate and rhythm, soft SEM Abdomen: no tenderness. Bowel sounds positive.  Musculoskeletal: no clubbing / cyanosis.  Skin: no rashes, lesions, ulcers. No induration Neurologic: non focal   Data Reviewed: I have personally reviewed following labs and imaging studies  CBC:  Recent Labs Lab 03/15/16 0935 03/19/16 2030 03/20/16 1148 03/20/16 1456 03/21/16 0513 03/22/16 0456  WBC 9.2 14.4* 12.8*  --  14.8* 11.2*  NEUTROABS 7.8* 12.0*  --   --   --   --   HGB 5.7* 4.4* 7.2* 6.9* 7.8* 7.8*  HCT 19.6* 15.5* 22.8* 21.4* 24.7* 24.5*  MCV 79.4 75.2* 79.2  --  79.9 83.6  PLT 246 319 268  --  254 198   Basic Metabolic Panel:  Recent Labs Lab 03/15/16 0935 03/19/16 2030 03/20/16 1148 03/21/16 0513 03/22/16 0456  NA 136 134* 139 134* 135  K 2.9* 3.6 3.2* 3.5 3.4*  CL 103 104 108 105 103  CO2 24 20* 20* 19* 27  GLUCOSE 184* 89 121* 106* 117*  BUN 11 9 6  5* 7  CREATININE 0.54 0.75 0.56 0.54 0.55  CALCIUM 8.0* 7.8* 7.5* 7.6* 7.8*   GFR: Estimated Creatinine Clearance: 71.9 mL/min (by C-G formula based on SCr of 0.55 mg/dL). Liver Function Tests:  Recent Labs Lab 03/15/16 0935 03/19/16 2030 03/22/16 0456  AST 23 28 28   ALT 11* 23 22  ALKPHOS 84 112 98  BILITOT 0.5 0.2* 0.6  PROT 7.4 6.9 6.6  ALBUMIN 2.2* 1.9* 1.7*   No results for input(s): LIPASE, AMYLASE in the last 168 hours. No results for input(s): AMMONIA in the last 168 hours. Coagulation Profile: No results for input(s): INR, PROTIME in the last 168 hours. Cardiac Enzymes:  Recent Labs Lab 03/15/16 0935  TROPONINI <0.03   BNP (last 3 results) No results for input(s): PROBNP in the last 8760 hours. HbA1C: No results for input(s): HGBA1C in the last 72 hours. CBG:  Recent Labs Lab 03/20/16 0906 03/20/16 1201  GLUCAP 117* 117*   Lipid Profile: No results for input(s): CHOL, HDL, LDLCALC, TRIG, CHOLHDL, LDLDIRECT in the  last 72 hours. Thyroid Function Tests: No results for input(s): TSH, T4TOTAL, FREET4, T3FREE, THYROIDAB in the last 72 hours. Anemia Panel: No results for input(s): VITAMINB12, FOLATE, FERRITIN, TIBC, IRON, RETICCTPCT in the last 72 hours. Urine analysis:    Component Value Date/Time   COLORURINE YELLOW 03/19/2016 2139   APPEARANCEUR CLEAR 03/19/2016 2139   LABSPEC 1.014 03/19/2016 2139   PHURINE 8.0 03/19/2016 2139   GLUCOSEU 50 (A) 03/19/2016 2139   HGBUR MODERATE (A) 03/19/2016 2139   BILIRUBINUR NEGATIVE 03/19/2016 2139   KETONESUR NEGATIVE 03/19/2016 2139   PROTEINUR 30 (A) 03/19/2016 2139   UROBILINOGEN 0.2 01/07/2012 1140   NITRITE NEGATIVE 03/19/2016  AetnaShaune Pollack29wBretta Bang AbedWashingto G>XTTAG>>:Redge Billi BADTEXTTAG>[phone] IdeThe [employer] of [employer]: [id number] Abed [facility]72Allena Katz illie Ruddy[phone] Gainer AetnaShaune Pollack29wBretta Bang [phone] IdeThe [employer] of [employer]: [id number] Abed [facility]72Allena Katz Billie Ruddy[phone] Gainer AetnaShaune Pollack52wBretta Bang [phone] IdeThe [employer] of [employer]: [id number] Abed [facility]33Allena Katz Billie Ruddy[phone] Gainer AetnaShaune Pollack24wBretta Bang [phone] IdeThe [employer] of [employer]: [id number] Abed [facility] 2[facility]55Allena Katz Billie Ruddy[address][phone] Gainer AetnaShaune Pollack29wBretta Bang [phone] IdeThe [employer] of [employer]: [id number] Abed [facility]  03/16/16 A BROWNING Performed at [facility]    Culture SERRATIA MARCESCENS (A)  Final   Report Status 03/18/2016 FINAL  Final   Organism ID, Bacteria SERRATIA MARCESCENS  Final      Susceptibility   Serratia marcescens - MIC*    CEFAZOLIN >=64 RESISTANT Resistant     CEFEPIME <=1 SENSITIVE Sensitive     CEFTAZIDIME <=1 SENSITIVE Sensitive     CEFTRIAXONE <=1 SENSITIVE Sensitive     CIPROFLOXACIN <=0.25 SENSITIVE Sensitive     GENTAMICIN <=1 SENSITIVE Sensitive     TRIMETH/SULFA <=20 SENSITIVE Sensitive     * SERRATIA MARCESCENS  Blood Culture ID Panel (Reflexed)     Status: Abnormal   Collection Time: 03/15/16  9:20 AM  Result Value Ref Range Status   Enterococcus species NOT DETECTED NOT DETECTED Final   Listeria monocytogenes NOT DETECTED NOT DETECTED Final   Staphylococcus species NOT DETECTED NOT DETECTED Final   Staphylococcus aureus NOT DETECTED NOT DETECTED Final   Streptococcus species NOT DETECTED NOT DETECTED Final   Streptococcus agalactiae NOT DETECTED NOT DETECTED Final   Streptococcus pneumoniae NOT DETECTED NOT DETECTED Final   Streptococcus pyogenes NOT DETECTED NOT DETECTED Final   Acinetobacter baumannii NOT DETECTED NOT DETECTED Final   Enterobacteriaceae species DETECTED (A) NOT DETECTED Final    Comment: CRITICAL RESULT CALLED TO, READ BACK BY AND VERIFIED WITH: D MERRITT RN [phone] 03/16/16 A BROWNING    Enterobacter cloacae complex NOT DETECTED NOT DETECTED Final   Escherichia coli NOT DETECTED NOT DETECTED Final   Klebsiella oxytoca NOT DETECTED NOT DETECTED Final   Klebsiella pneumoniae NOT DETECTED NOT DETECTED Final   Proteus species NOT DETECTED NOT DETECTED Final   Serratia marcescens DETECTED (A) NOT DETECTED Final    Comment: CRITICAL RESULT CALLED TO, READ BACK BY AND VERIFIED WITH: D MERRITT RN [phone] 03/16/16 A BROWNING     Carbapenem resistance NOT DETECTED NOT DETECTED Final   Haemophilus influenzae NOT DETECTED NOT DETECTED Final   Neisseria meningitidis NOT DETECTED NOT DETECTED Final   Pseudomonas aeruginosa NOT DETECTED NOT DETECTED Final   Candida albicans NOT DETECTED NOT DETECTED Final   Candida glabrata NOT DETECTED NOT DETECTED Final   Candida krusei NOT DETECTED NOT DETECTED Final   Candida parapsilosis NOT DETECTED NOT DETECTED Final   Candida tropicalis NOT DETECTED NOT DETECTED Final    Comment: Performed at [facility]  Blood Culture (routine x 2)     Status: None   Collection Time: 03/15/16  9:30 AM  Result Value Ref Range Status   Specimen Description BLOOD RT [facility]  Final   Special Requests BOTTLES DRAWN AEROBIC AND ANAEROBIC 5CC  Final   Culture   Final    NO GROWTH 5 DAYS Performed at [facility]    Report Status 03/20/2016 FINAL  Final  Urine culture     Status: Abnormal   Collection Time: 03/15/16  1:00 PM  Result Value Ref Range Status   Specimen Description URINE, RANDOM  Final   Special Requests NONE  Final   Culture (A)  Final    <10,000 COLONIES/mL INSIGNIFICANT GROWTH Performed at [facility]    Report Status 03/17/2016 FINAL  Final  Urine culture     Status: None   Collection Time: 03/15/16  1:00 PM  Result Value Ref Range Status   Specimen Description URINE, CLEAN CATCH  Final   Special Requests NONE  Final   Culture NO GROWTH Performed at [facility]   Final   Report Status 03/17/2016 FINAL  Final  MRSA PCR Screening     Status: Abnormal   Collection Time: 03/15/16  6:00 PM  Result Value Ref Range Status   MRSA by PCR POSITIVE (A) NEGATIVE Final    Comment:        The GeneXpert MRSA Assay (FDA approved for NASAL specimens only), is one component of a comprehensive MRSA colonization surveillance program. It is not intended to diagnose MRSA infection nor to guide or monitor treatment for MRSA infections. RESULT CALLED TO,  READ BACK BY AND VERIFIED WITH: QUEEN RN AT 2149 ON 03/15/16 BY S.VANHOORNE   Blood Culture (routine x 2)     Status: Abnormal (Preliminary result)   Collection Time: 03/19/16  8:30 PM  Result Value Ref Range Status   Specimen Description BLOOD LEFT ARM  Final   Special Requests BOTTLES DRAWN AEROBIC AND ANAEROBIC 5CC EA  Final   Culture  Setup Time   Final    GRAM NEGATIVE RODS IN BOTH AEROBIC AND ANAEROBIC BOTTLES CRITICAL RESULT CALLED TO, READ BACK BY AND VERIFIED WITH: N BATCHELDER,PHARMD AT 1242 03/20/16 BY L BENFIELD    Culture SERRATIA MARCESCENS (A)  Final   Report Status PENDING  Incomplete  Urine culture     Status: Abnormal   Collection Time: 03/19/16  9:39 PM  Result Value Ref Range Status   Specimen Description URINE, RANDOM  Final   Special Requests NONE  Final   Culture MULTIPLE SPECIES PRESENT, SUGGEST RECOLLECTION (A)  Final   Report Status 03/21/2016 FINAL  Final  Blood Culture (routine x 2)     Status: Abnormal (Preliminary result)   Collection Time: 03/19/16 11:00 PM  Result Value Ref Range Status   Specimen Description BLOOD RIGHT ARM  Final   Special Requests IN PEDIATRIC BOTTLE 3CC  Final   Culture  Setup Time   Final    GRAM NEGATIVE RODS IN PEDIATRIC BOTTLE CRITICAL RESULT CALLED TO, READ BACK BY AND VERIFIED WITH: N BATCHELDER,PHARMD AT 1242 03/20/16 BY L BENFIELD    Culture SERRATIA MARCESCENS SUSCEPTIBILITIES TO FOLLOW  (A)  Final   Report Status PENDING  Incomplete  Blood Culture ID Panel (Reflexed)     Status: Abnormal   Collection Time: 03/19/16 11:00 PM  Result Value Ref Range Status   Enterococcus species NOT DETECTED NOT DETECTED Final   Listeria monocytogenes NOT DETECTED NOT DETECTED Final   Staphylococcus species DETECTED (A) NOT DETECTED Final    Comment: CRITICAL RESULT CALLED TO, READ BACK BY AND VERIFIED WITH: N BATCHELDER,PHARMD AT 1242 03/20/16 BY L BENFIELD    Staphylococcus aureus NOT DETECTED NOT DETECTED Final   Methicillin  resistance DETECTED (A) NOT DETECTED Final    Comment: CRITICAL RESULT CALLED TO, READ BACK BY AND VERIFIED WITH: N BATCHELDER,PHARMD AT 1242 03/20/16 BY L BENFIELD    Streptococcus species NOT DETECTED NOT DETECTED Final   Streptococcus agalactiae NOT DETECTED NOT DETECTED Final   Streptococcus pneumoniae NOT DETECTED NOT DETECTED Final   Streptococcus pyogenes NOT DETECTED NOT DETECTED Final   Acinetobacter baumannii NOT DETECTED NOT DETECTED Final   Enterobacteriaceae species DETECTED (A) NOT DETECTED Final    Comment: CRITICAL RESULT CALLED TO, READ BACK BY AND VERIFIED WITH: N BATCHELDER,PHARMD AT 1242 03/20/16 BY L BENFIELD    Enterobacter cloacae complex NOT DETECTED NOT DETECTED Final   Escherichia coli NOT DETECTED NOT DETECTED Final   Klebsiella oxytoca NOT DETECTED NOT DETECTED Final  Klebsiella pneumoniae NOT DETECTED NOT DETECTED Final   Proteus species NOT DETECTED NOT DETECTED Final   Serratia marcescens DETECTED (A) NOT DETECTED Final    Comment: CRITICAL RESULT CALLED TO, READ BACK BY AND VERIFIED WITH: N BATCHELDER,PHARMD AT 1242 03/20/16 BY L BENFIELD    Carbapenem resistance NOT DETECTED NOT DETECTED Final   Haemophilus influenzae NOT DETECTED NOT DETECTED Final   Neisseria meningitidis NOT DETECTED NOT DETECTED Final   Pseudomonas aeruginosa NOT DETECTED NOT DETECTED Final   Candida albicans NOT DETECTED NOT DETECTED Final   Candida glabrata NOT DETECTED NOT DETECTED Final   Candida krusei NOT DETECTED NOT DETECTED Final   Candida parapsilosis NOT DETECTED NOT DETECTED Final   Candida tropicalis NOT DETECTED NOT DETECTED Final  MRSA PCR Screening     Status: Abnormal   Collection Time: 03/20/16  3:17 AM  Result Value Ref Range Status   MRSA by PCR POSITIVE (A) NEGATIVE Final    Comment:        The GeneXpert MRSA Assay (FDA approved for NASAL specimens only), is one component of a comprehensive MRSA colonization surveillance program. It is not intended to  diagnose MRSA infection nor to guide or monitor treatment for MRSA infections. RESULT CALLED TO, READ BACK BY AND VERIFIED WITH: C. SCHILLER 0901 12.18.2017 N. MORRIS   Culture, blood (single)     Status: None (Preliminary result)   Collection Time: 03/20/16 11:33 PM  Result Value Ref Range Status   Specimen Description BLOOD RIGHT ARM  Final   Special Requests IN PEDIATRIC BOTTLE 2CC  Final   Culture  Setup Time   Final    GRAM NEGATIVE RODS IN PEDIATRIC BOTTLE CRITICAL VALUE NOTED.  VALUE IS CONSISTENT WITH PREVIOUSLY REPORTED AND CALLED VALUE.    Culture GRAM NEGATIVE RODS  Final   Report Status PENDING  Incomplete      Radiology Studies: No results found.   Scheduled Meds: . cefTRIAXone (ROCEPHIN)  IV  2 g Intravenous Q24H  . Chlorhexidine Gluconate Cloth  6 each Topical Q0600  . folic acid  1 mg Oral Daily  . multivitamin with minerals  1 tablet Oral Daily  . mupirocin ointment  1 application Nasal BID  . thiamine  100 mg Oral Daily   Or  . thiamine  100 mg Intravenous Daily   Continuous Infusions:     [facility] Pager [phone]  If 7PM-7AM, please contact night-coverage [email or web address] Password [facility] [email or web address] 03/22/2016, 9:13 AM

## 2016-03-22 NOTE — Progress Notes (Signed)
  Subjective: Patient examined, most recent echocardiogram and chest x-ray personally reviewed and counseled with patient Patient with recurrent bacteremia now with gram-negative rod, bilateral pneumonia and vegetation of tricuspid valve with severe TR. The patient is severely protein malnourished with albumin 1.6   And has had significant weight loss The patient would not survive heart valve surgery and should be treated with antibiotics and emphasis on comfort care Objective: Vital signs in last 24 hours: Temp:  [98.9 F (37.2 C)-102 F (38.9 C)] 99.2 F (37.3 C) (12/20 1653) Pulse Rate:  [110-125] 114 (12/20 1653) Cardiac Rhythm: Sinus tachycardia (12/20 1525) Resp:  [22-34] 25 (12/20 1653) BP: (98-128)/(60-78) 98/78 (12/20 1653) SpO2:  [96 %-100 %] 100 % (12/20 1653)  Hemodynamic parameters for last 24 hours:  tachycardia  Intake/Output from previous day: No intake/output data recorded. Intake/Output this shift: No intake/output data recorded.  Malnourished acute and chronically ill female Coarse breath sounds bilaterally 3/6 murmur of TR Chest tender, abdomen tender, mild peripheral edema  Lab Results:  Recent Labs  03/21/16 0513 03/22/16 0456  WBC 14.8* 11.2*  HGB 7.8* 7.8*  HCT 24.7* 24.5*  PLT 254 198   BMET:  Recent Labs  03/21/16 0513 03/22/16 0456  NA 134* 135  K 3.5 3.4*  CL 105 103  CO2 19* 27  GLUCOSE 106* 117*  BUN 5* 7  CREATININE 0.54 0.55  CALCIUM 7.6* 7.8*    PT/INR: No results for input(s): LABPROT, INR in the last 72 hours. ABG    Component Value Date/Time   PHART 7.421 11/16/2015 2144   HCO3 17.9 (L) 11/16/2015 2144   TCO2 23 12/16/2015 0546   ACIDBASEDEF 5.0 (H) 11/16/2015 2144   O2SAT 94.0 11/16/2015 2144   CBG (last 3)   Recent Labs  03/20/16 0906 03/20/16 1201  GLUCAP 117* 117*    Assessment/Plan: S/P  .Surgical candidate Continue antibiotics medical care and consider palliative care consult   LOS: 2 days     Kathlee Nationseter Van Trigt III 03/22/2016

## 2016-03-22 NOTE — Progress Notes (Signed)
Subjective:  "When can I leave?"   Antibiotics:  Anti-infectives    Start     Dose/Rate Route Frequency Ordered Stop   03/22/16 1345  ciprofloxacin (CIPRO) tablet 500 mg     500 mg Oral 2 times daily 03/22/16 1332     03/20/16 0600  cefTRIAXone (ROCEPHIN) 2 g in dextrose 5 % 50 mL IVPB  Status:  Discontinued     2 g 100 mL/hr over 30 Minutes Intravenous Every 24 hours 03/20/16 0008 03/22/16 1332   03/19/16 2215  ceFEPIme (MAXIPIME) 2 g in dextrose 5 % 50 mL IVPB  Status:  Discontinued     2 g 100 mL/hr over 30 Minutes Intravenous Every 8 hours 03/19/16 2203 03/20/16 0008      Medications: Scheduled Meds: . Chlorhexidine Gluconate Cloth  6 each Topical Q0600  . ciprofloxacin  500 mg Oral BID  . folic acid  1 mg Oral Daily  . multivitamin with minerals  1 tablet Oral Daily  . mupirocin ointment  1 application Nasal BID  . thiamine  100 mg Oral Daily   Or  . thiamine  100 mg Intravenous Daily   Continuous Infusions: PRN Meds:.acetaminophen, LORazepam **OR** LORazepam, ondansetron (ZOFRAN) IV, traMADol    Objective: Weight change:  No intake or output data in the 24 hours ending 03/22/16 1332 Blood pressure 109/65, pulse (!) 123, temperature (!) 101.6 F (38.7 C), temperature source Oral, resp. rate (!) 33, height 5\' 5"  (1.651 m), weight 95 lb 14.4 oz (43.5 kg), SpO2 96 %. Temp:  [98.9 F (37.2 C)-102 F (38.9 C)] 101.6 F (38.7 C) (12/20 1131) Pulse Rate:  [110-125] 123 (12/20 1131) Resp:  [30-34] 33 (12/20 1131) BP: (99-128)/(60-71) 109/65 (12/20 1131) SpO2:  [96 %-100 %] 96 % (12/20 1131)  Physical Exam: General: Alert and awake, oriented x3, not in any acute distress. HEENT: anicteric sclera,  EOMI, oropharynx clear and without exudate Cardiovascular: tachycardic rate, normal r,  no murmur rubs or gallops Pulmonary: coarse breath sounds Gastrointestinal: soft nontender, nondistended, normal bowel sounds, Musculoskeletal, Skin: track marks, no  [place] or [place] Skin, soft tissue: no rashes Neuro: nonfocal, strength and sensation intact  CBC: CBC Latest Ref Rng & Units 03/22/2016 03/21/2016 03/20/2016  WBC 4.0 - 10.5 K/uL 11.2(H) 14.8(H) -  Hemoglobin 12.0 - 15.0 g/dL 7.8(L) 7.8(L) 6.9(LL)  Hematocrit 36.0 - 46.0 % 24.5(L) 24.7(L) 21.4(L)  Platelets 150 - 400 K/uL 198 254 -      BMET  Recent Labs  03/21/16 0513 03/22/16 0456  NA 134* 135  K 3.5 3.4*  CL 105 103  CO2 19* 27  GLUCOSE 106* 117*  BUN 5* 7  CREATININE 0.54 0.55  CALCIUM 7.6* 7.8*     Liver Panel   Recent Labs  03/19/16 2030 03/22/16 0456  PROT 6.9 6.6  ALBUMIN 1.9* 1.7*  AST 28 28  ALT 23 22  ALKPHOS 112 98  BILITOT 0.2* 0.6       Sedimentation Rate No results for input(s): ESRSEDRATE in the last 72 hours. C-Reactive Protein No results for input(s): CRP in the last 72 hours.  Micro Results: Recent Results (from the past 720 hour(s))  Culture, blood (Routine X 2) w Reflex to ID Panel     Status: Abnormal   Collection Time: 03/14/16 10:05 AM  Result Value Ref Range Status   Specimen Description BLOOD RIGHT FOREARM  Final   Special Requests BOTTLES DRAWN AEROBIC AND ANAEROBIC 5ML  Final   Culture  Setup Time   Final    GRAM NEGATIVE RODS IN BOTH AEROBIC AND ANAEROBIC BOTTLES CRITICAL RESULT CALLED TO, READ BACK BY AND VERIFIED WITH: Kermit BaloL ADKINS RN [phone] 03/15/16 A BROWNING Performed [facility]    Culture SERRATIA MARCESCENS (A)  Final   Report Status 03/17/2016 FINAL  Final   Organism ID, Bacteria SERRATIA MARCESCENS  Final      Susceptibility   Serratia marcescens - MIC*    CEFAZOLIN >=64 RESISTANT Resistant     CEFEPIME <=1 SENSITIVE Sensitive     CEFTAZIDIME <=1 SENSITIVE Sensitive     CEFTRIAXONE <=1 SENSITIVE Sensitive     CIPROFLOXACIN <=0.25 SENSITIVE Sensitive     GENTAMICIN <=1 SENSITIVE Sensitive     TRIMETH/SULFA <=20 SENSITIVE Sensitive     * SERRATIA MARCESCENS  Blood Culture ID Panel (Reflexed)      Status: Abnormal   Collection Time: 03/14/16 10:05 AM  Result Value Ref Range Status   Enterococcus species NOT DETECTED NOT DETECTED Final   Listeria monocytogenes NOT DETECTED NOT DETECTED Final   Staphylococcus species NOT DETECTED NOT DETECTED Final   Staphylococcus aureus NOT DETECTED NOT DETECTED Final   Streptococcus species NOT DETECTED NOT DETECTED Final   Streptococcus agalactiae NOT DETECTED NOT DETECTED Final   Streptococcus pneumoniae NOT DETECTED NOT DETECTED Final   Streptococcus pyogenes NOT DETECTED NOT DETECTED Final   Acinetobacter baumannii NOT DETECTED NOT DETECTED Final   Enterobacteriaceae species DETECTED (A) NOT DETECTED Final    Comment: CRITICAL RESULT CALLED TO, READ BACK BY AND VERIFIED WITH: L ADKINS RN [phone] 03/15/16 A BROWNING    Enterobacter cloacae complex NOT DETECTED NOT DETECTED Final   Escherichia coli NOT DETECTED NOT DETECTED Final   Klebsiella oxytoca NOT DETECTED NOT DETECTED Final   Klebsiella pneumoniae NOT DETECTED NOT DETECTED Final   Proteus species NOT DETECTED NOT DETECTED Final   Serratia marcescens DETECTED (A) NOT DETECTED Final    Comment: CRITICAL RESULT CALLED TO, READ BACK BY AND VERIFIED WITH: L ADKINS RN [phone] 03/15/16 A BROWNING    Carbapenem resistance NOT DETECTED NOT DETECTED Final   Haemophilus influenzae NOT DETECTED NOT DETECTED Final   Neisseria meningitidis NOT DETECTED NOT DETECTED Final   Pseudomonas aeruginosa NOT DETECTED NOT DETECTED Final   Candida albicans NOT DETECTED NOT DETECTED Final   Candida glabrata NOT DETECTED NOT DETECTED Final   Candida krusei NOT DETECTED NOT DETECTED Final   Candida parapsilosis NOT DETECTED NOT DETECTED Final   Candida tropicalis NOT DETECTED NOT DETECTED Final    Comment: Performed at [facility]  Blood Culture (routine x 2)     Status: Abnormal   Collection Time: 03/15/16  9:20 AM  Result Value Ref Range Status   Specimen Description BLOOD LEFT HAND  Final    Special Requests IN PEDIATRIC BOTTLE 4CC  Final   Culture  Setup Time   Final    GRAM NEGATIVE RODS AEROBIC BOTTLE ONLY CRITICAL RESULT CALLED TO, READ BACK BY AND VERIFIED WITH: D MERRITT RN [phone] 03/16/16 A BROWNING Performed at [facility]    Culture SERRATIA MARCESCENS (A)  Final   Report Status 03/18/2016 FINAL  Final   Organism ID, Bacteria SERRATIA MARCESCENS  Final      Susceptibility   Serratia marcescens - MIC*    CEFAZOLIN >=64 RESISTANT Resistant     CEFEPIME <=1 SENSITIVE Sensitive     CEFTAZIDIME <=1 SENSITIVE Sensitive  CEFTRIAXONE <=1 SENSITIVE Sensitive     CIPROFLOXACIN <=0.25 SENSITIVE Sensitive     GENTAMICIN <=1 SENSITIVE Sensitive     TRIMETH/SULFA <=20 SENSITIVE Sensitive     * SERRATIA MARCESCENS  Blood Culture ID Panel (Reflexed)     Status: Abnormal   Collection Time: 03/15/16  9:20 AM  Result Value Ref Range Status   Enterococcus species NOT DETECTED NOT DETECTED Final   Listeria monocytogenes NOT DETECTED NOT DETECTED Final   Staphylococcus species NOT DETECTED NOT DETECTED Final   Staphylococcus aureus NOT DETECTED NOT DETECTED Final   Streptococcus species NOT DETECTED NOT DETECTED Final   Streptococcus agalactiae NOT DETECTED NOT DETECTED Final   Streptococcus pneumoniae NOT DETECTED NOT DETECTED Final   Streptococcus pyogenes NOT DETECTED NOT DETECTED Final   Acinetobacter baumannii NOT DETECTED NOT DETECTED Final   Enterobacteriaceae species DETECTED (A) NOT DETECTED Final    Comment: CRITICAL RESULT CALLED TO, READ BACK BY AND VERIFIED WITH: D MERRITT RN [phone] 03/16/16 A BROWNING    Enterobacter cloacae complex NOT DETECTED NOT DETECTED Final   Escherichia coli NOT DETECTED NOT DETECTED Final   Klebsiella oxytoca NOT DETECTED NOT DETECTED Final   Klebsiella pneumoniae NOT DETECTED NOT DETECTED Final   Proteus species NOT DETECTED NOT DETECTED Final   Serratia marcescens DETECTED (A) NOT DETECTED Final    Comment: CRITICAL RESULT  CALLED TO, READ BACK BY AND VERIFIED WITH: D MERRITT RN [phone] 03/16/16 A BROWNING    Carbapenem resistance NOT DETECTED NOT DETECTED Final   Haemophilus influenzae NOT DETECTED NOT DETECTED Final   Neisseria meningitidis NOT DETECTED NOT DETECTED Final   Pseudomonas aeruginosa NOT DETECTED NOT DETECTED Final   Candida albicans NOT DETECTED NOT DETECTED Final   Candida glabrata NOT DETECTED NOT DETECTED Final   Candida krusei NOT DETECTED NOT DETECTED Final   Candida parapsilosis NOT DETECTED NOT DETECTED Final   Candida tropicalis NOT DETECTED NOT DETECTED Final    Comment: Performed at [facility]  Blood Culture (routine x 2)     Status: None   Collection Time: 03/15/16  9:30 AM  Result Value Ref Range Status   Specimen Description BLOOD RT [facility]  Final   Special Requests BOTTLES DRAWN AEROBIC AND ANAEROBIC 5CC  Final   Culture   Final    NO GROWTH 5 DAYS Performed at [facility]    Report Status 03/20/2016 FINAL  Final  Urine culture     Status: Abnormal   Collection Time: 03/15/16  1:00 PM  Result Value Ref Range Status   Specimen Description URINE, RANDOM  Final   Special Requests NONE  Final   Culture (A)  Final    <10,000 COLONIES/mL INSIGNIFICANT GROWTH Performed at [facility]    Report Status 03/17/2016 FINAL  Final  Urine culture     Status: None   Collection Time: 03/15/16  1:00 PM  Result Value Ref Range Status   Specimen Description URINE, CLEAN CATCH  Final   Special Requests NONE  Final   Culture NO GROWTH Performed at [facility]   Final   Report Status 03/17/2016 FINAL  Final  MRSA PCR Screening     Status: Abnormal   Collection Time: 03/15/16  6:00 PM  Result Value Ref Range Status   MRSA by PCR POSITIVE (A) NEGATIVE Final    Comment:        The GeneXpert MRSA Assay (FDA approved for NASAL specimens only), is one component of a comprehensive  MRSA colonization surveillance program. It is not intended to diagnose  MRSA infection nor to guide or monitor treatment for MRSA infections. RESULT CALLED TO, READ BACK BY AND VERIFIED WITH: QUEEN RN AT 2149 ON 03/15/16 BY S.VANHOORNE   Blood Culture (routine x 2)     Status: Abnormal (Preliminary result)   Collection Time: 03/19/16  8:30 PM  Result Value Ref Range Status   Specimen Description BLOOD LEFT ARM  Final   Special Requests BOTTLES DRAWN AEROBIC AND ANAEROBIC 5CC EA  Final   Culture  Setup Time   Final    GRAM NEGATIVE RODS IN BOTH AEROBIC AND ANAEROBIC BOTTLES CRITICAL RESULT CALLED TO, READ BACK BY AND VERIFIED WITH: N BATCHELDER,PHARMD AT 1242 03/20/16 BY L BENFIELD    Culture (A)  Final    SERRATIA MARCESCENS SUSCEPTIBILITIES PERFORMED ON PREVIOUS CULTURE WITHIN THE LAST 5 DAYS.    Report Status PENDING  Incomplete  Urine culture     Status: Abnormal   Collection Time: 03/19/16  9:39 PM  Result Value Ref Range Status   Specimen Description URINE, RANDOM  Final   Special Requests NONE  Final   Culture MULTIPLE SPECIES PRESENT, SUGGEST RECOLLECTION (A)  Final   Report Status 03/21/2016 FINAL  Final  Blood Culture (routine x 2)     Status: Abnormal (Preliminary result)   Collection Time: 03/19/16 11:00 PM  Result Value Ref Range Status   Specimen Description BLOOD RIGHT ARM  Final   Special Requests IN PEDIATRIC BOTTLE 3CC  Final   Culture  Setup Time   Final    GRAM NEGATIVE RODS IN PEDIATRIC BOTTLE CRITICAL RESULT CALLED TO, READ BACK BY AND VERIFIED WITH: N BATCHELDER,PHARMD AT 1242 03/20/16 BY L BENFIELD    Culture SERRATIA MARCESCENS (A)  Final   Report Status PENDING  Incomplete   Organism ID, Bacteria SERRATIA MARCESCENS  Final      Susceptibility   Serratia marcescens - MIC*    CEFAZOLIN >=64 RESISTANT Resistant     CEFEPIME <=1 SENSITIVE Sensitive     CEFTAZIDIME <=1 SENSITIVE Sensitive     CEFTRIAXONE <=1 SENSITIVE Sensitive     CIPROFLOXACIN <=0.25 SENSITIVE Sensitive     GENTAMICIN <=1 SENSITIVE Sensitive      TRIMETH/SULFA <=20 SENSITIVE Sensitive     * SERRATIA MARCESCENS  Blood Culture ID Panel (Reflexed)     Status: Abnormal   Collection Time: 03/19/16 11:00 PM  Result Value Ref Range Status   Enterococcus species NOT DETECTED NOT DETECTED Final   Listeria monocytogenes NOT DETECTED NOT DETECTED Final   Staphylococcus species DETECTED (A) NOT DETECTED Final    Comment: CRITICAL RESULT CALLED TO, READ BACK BY AND VERIFIED WITH: N BATCHELDER,PHARMD AT 1242 03/20/16 BY L BENFIELD    Staphylococcus aureus NOT DETECTED NOT DETECTED Final   Methicillin resistance DETECTED (A) NOT DETECTED Final    Comment: CRITICAL RESULT CALLED TO, READ BACK BY AND VERIFIED WITH: N BATCHELDER,PHARMD AT 1242 03/20/16 BY L BENFIELD    Streptococcus species NOT DETECTED NOT DETECTED Final   Streptococcus agalactiae NOT DETECTED NOT DETECTED Final   Streptococcus pneumoniae NOT DETECTED NOT DETECTED Final   Streptococcus pyogenes NOT DETECTED NOT DETECTED Final   Acinetobacter baumannii NOT DETECTED NOT DETECTED Final   Enterobacteriaceae species DETECTED (A) NOT DETECTED Final    Comment: CRITICAL RESULT CALLED TO, READ BACK BY AND VERIFIED WITH: N BATCHELDER,PHARMD AT 1242 03/20/16 BY L BENFIELD    Enterobacter cloacae complex NOT DETECTED NOT DETECTED  Final   Escherichia coli NOT DETECTED NOT DETECTED Final   Klebsiella oxytoca NOT DETECTED NOT DETECTED Final   Klebsiella pneumoniae NOT DETECTED NOT DETECTED Final   Proteus species NOT DETECTED NOT DETECTED Final   Serratia marcescens DETECTED (A) NOT DETECTED Final    Comment: CRITICAL RESULT CALLED TO, READ BACK BY AND VERIFIED WITH: N BATCHELDER,PHARMD AT 1242 03/20/16 BY L BENFIELD    Carbapenem resistance NOT DETECTED NOT DETECTED Final   Haemophilus influenzae NOT DETECTED NOT DETECTED Final   Neisseria meningitidis NOT DETECTED NOT DETECTED Final   Pseudomonas aeruginosa NOT DETECTED NOT DETECTED Final   Candida albicans NOT DETECTED NOT DETECTED  Final   Candida glabrata NOT DETECTED NOT DETECTED Final   Candida krusei NOT DETECTED NOT DETECTED Final   Candida parapsilosis NOT DETECTED NOT DETECTED Final   Candida tropicalis NOT DETECTED NOT DETECTED Final  MRSA PCR Screening     Status: Abnormal   Collection Time: 03/20/16  3:17 AM  Result Value Ref Range Status   MRSA by PCR POSITIVE (A) NEGATIVE Final    Comment:        The GeneXpert MRSA Assay (FDA approved for NASAL specimens only), is one component of a comprehensive MRSA colonization surveillance program. It is not intended to diagnose MRSA infection nor to guide or monitor treatment for MRSA infections. RESULT CALLED TO, READ BACK BY AND VERIFIED WITH: C. SCHILLER 0901 12.18.2017 N. MORRIS   Culture, blood (single)     Status: Abnormal (Preliminary result)   Collection Time: 03/20/16 11:33 PM  Result Value Ref Range Status   Specimen Description BLOOD RIGHT ARM  Final   Special Requests IN PEDIATRIC BOTTLE 2CC  Final   Culture  Setup Time   Final    GRAM NEGATIVE RODS IN PEDIATRIC BOTTLE CRITICAL VALUE NOTED.  VALUE IS CONSISTENT WITH PREVIOUSLY REPORTED AND CALLED VALUE.    Culture (A)  Final    SERRATIA MARCESCENS SUSCEPTIBILITIES PERFORMED ON PREVIOUS CULTURE WITHIN THE LAST 5 DAYS.    Report Status PENDING  Incomplete  Culture, blood (Routine X 2) w Reflex to ID Panel     Status: None (Preliminary result)   Collection Time: 03/21/16  5:14 AM  Result Value Ref Range Status   Specimen Description BLOOD LEFT ARM  Final   Special Requests BOTTLES DRAWN AEROBIC AND ANAEROBIC  Final   Culture NO GROWTH 1 DAY  Final   Report Status PENDING  Incomplete  Culture, blood (Routine X 2) w Reflex to ID Panel     Status: None (Preliminary result)   Collection Time: 03/21/16  5:35 AM  Result Value Ref Range Status   Specimen Description BLOOD RIGHT ARM  Final   Special Requests AEROBIC BOTTLE ONLY  Final   Culture NO GROWTH 1 DAY  Final   Report Status  PENDING  Incomplete    Studies/Results: No results found.    Assessment/Plan:  INTERVAL HISTORY:   GNR growing on repeat cx 18th, but not 19th SO far     Principal Problem:   Serratia sepsis (HCC) Active Problems:   Opioid use disorder, severe, dependence (HCC)   Heroin abuse   Symptomatic anemia   Multifocal pneumonia   Septicemia (HCC)   Septic embolism (HCC)    Catherine Hernandez is a 30 y.o. female with  30 y.o. 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  #1 Serratia bacteremia:  Her 2-D echocardiogram shows a large right-sided vegetation on tricuspid valve, with severe tricuspid regurgitation and dilation of IVC.  Would ask cardiothoracic surgery their opinion about whether they would consider taking this patient to the operating room--- I would seriously doubt it given her ongoing IV drug abuse) she denies during this admission but that is certainly not believable) and her history of leaving AGAINST MEDICAL ADVICE  She says that cause may be an issue for antibiotic so I'm going to change her to ciprofloxacin 500 mg twice daily and I would discharge her with a month's worth of these drugs.  Ciprofloxacin is on the "$4 plan at [facility] but another option would be just give her the drugs from [facility] outpatient pharmacy if they are agreeable so that there is no reason for the patient have that she does not have the antimicrobials she needs    #2 ongoing IV drug use: needs to be in a program  The patient can take the initiative to arrange a hospital follow-up with [place] herself by calling our clinic herself      LOS: 2 days   Catherine Hernandez 03/22/2016, 1:32 PM

## 2016-03-23 ENCOUNTER — Inpatient Hospital Stay (HOSPITAL_COMMUNITY): Payer: Medicaid Other

## 2016-03-23 DIAGNOSIS — R079 Chest pain, unspecified: Secondary | ICD-10-CM

## 2016-03-23 DIAGNOSIS — I2699 Other pulmonary embolism without acute cor pulmonale: Secondary | ICD-10-CM

## 2016-03-23 DIAGNOSIS — I200 Unstable angina: Secondary | ICD-10-CM

## 2016-03-23 DIAGNOSIS — Z7189 Other specified counseling: Secondary | ICD-10-CM

## 2016-03-23 DIAGNOSIS — Z515 Encounter for palliative care: Secondary | ICD-10-CM

## 2016-03-23 LAB — CULTURE, BLOOD (ROUTINE X 2)

## 2016-03-23 LAB — CBC
HCT: 27.2 % — ABNORMAL LOW (ref 36.0–46.0)
Hemoglobin: 8.4 g/dL — ABNORMAL LOW (ref 12.0–15.0)
MCH: 25.6 pg — ABNORMAL LOW (ref 26.0–34.0)
MCHC: 30.9 g/dL (ref 30.0–36.0)
MCV: 82.9 fL (ref 78.0–100.0)
Platelets: 167 10*3/uL (ref 150–400)
RBC: 3.28 MIL/uL — ABNORMAL LOW (ref 3.87–5.11)
RDW: 20.1 % — ABNORMAL HIGH (ref 11.5–15.5)
WBC: 12.5 10*3/uL — ABNORMAL HIGH (ref 4.0–10.5)

## 2016-03-23 LAB — COMPREHENSIVE METABOLIC PANEL
ALT: 23 U/L (ref 14–54)
AST: 42 U/L — ABNORMAL HIGH (ref 15–41)
Albumin: 1.8 g/dL — ABNORMAL LOW (ref 3.5–5.0)
Alkaline Phosphatase: 99 U/L (ref 38–126)
Anion gap: 6 (ref 5–15)
BUN: 13 mg/dL (ref 6–20)
CO2: 24 mmol/L (ref 22–32)
Calcium: 7.7 mg/dL — ABNORMAL LOW (ref 8.9–10.3)
Chloride: 107 mmol/L (ref 101–111)
Creatinine, Ser: 0.6 mg/dL (ref 0.44–1.00)
GFR calc Af Amer: >60 mL/min (ref >60)
GFR calc non Af Amer: >60 mL/min (ref >60)
Glucose, Bld: 81 mg/dL (ref 65–99)
Potassium: 6 mmol/L — ABNORMAL HIGH (ref 3.5–5.1)
Sodium: 137 mmol/L (ref 135–145)
Total Bilirubin: 1.1 mg/dL (ref 0.3–1.2)
Total Protein: 7.7 g/dL (ref 6.5–8.1)

## 2016-03-23 LAB — PARASITE EXAM, BLOOD

## 2016-03-23 LAB — HEPARIN LEVEL (UNFRACTIONATED)

## 2016-03-23 MED ORDER — ENSURE ENLIVE PO LIQD
237.0000 mL | Freq: Two times a day (BID) | ORAL | Status: DC
  Administered 2016-03-23 – 2016-03-26 (×4): 237 mL via ORAL

## 2016-03-23 MED ORDER — THIAMINE HCL 100 MG/ML IJ SOLN: 100.0000 mg | Freq: Every day | INTRAMUSCULAR | Status: DC

## 2016-03-23 MED ORDER — LORAZEPAM 1 MG PO TABS
1.0000 mg | ORAL_TABLET | Freq: Four times a day (QID) | ORAL | Status: DC | PRN
  Administered 2016-03-23 – 2016-03-26 (×12): 1 mg via ORAL
  Filled 2016-03-23 (×12): qty 1

## 2016-03-23 MED ORDER — FOLIC ACID 1 MG PO TABS
1.0000 mg | ORAL_TABLET | Freq: Every day | ORAL | Status: DC
  Administered 2016-03-24 – 2016-03-28 (×5): 1 mg via ORAL
  Filled 2016-03-23 (×5): qty 1

## 2016-03-23 MED ORDER — ADULT MULTIVITAMIN W/MINERALS CH
1.0000 | ORAL_TABLET | Freq: Every day | ORAL | Status: DC
  Administered 2016-03-24 – 2016-03-28 (×5): 1 via ORAL
  Filled 2016-03-23 (×5): qty 1

## 2016-03-23 MED ORDER — LORAZEPAM 2 MG/ML IJ SOLN: 1.0000 mg | Freq: Four times a day (QID) | INTRAMUSCULAR | Status: DC | PRN

## 2016-03-23 MED ORDER — HEPARIN BOLUS VIA INFUSION
1500.0000 [IU] | Freq: Once | INTRAVENOUS | Status: AC
  Administered 2016-03-23: 1500 [IU] via INTRAVENOUS
  Filled 2016-03-23: qty 1500

## 2016-03-23 MED ORDER — SODIUM POLYSTYRENE SULFONATE 15 GM/60ML PO SUSP
15.0000 g | Freq: Once | ORAL | Status: AC
  Administered 2016-03-23: 15 g via ORAL
  Filled 2016-03-23: qty 60

## 2016-03-23 MED ORDER — HEPARIN BOLUS VIA INFUSION
2500.0000 [IU] | Freq: Once | INTRAVENOUS | Status: AC
  Administered 2016-03-23: 2500 [IU] via INTRAVENOUS
  Filled 2016-03-23: qty 2500

## 2016-03-23 MED ORDER — VITAMIN B-1 100 MG PO TABS: 100.0000 mg | ORAL_TABLET | Freq: Every day | ORAL | Status: DC

## 2016-03-23 MED ORDER — ADULT MULTIVITAMIN W/MINERALS CH: 1.0000 | ORAL_TABLET | Freq: Every day | ORAL | Status: DC

## 2016-03-23 MED ORDER — HEPARIN (PORCINE) IN NACL 100-0.45 UNIT/ML-% IJ SOLN
1100.0000 [IU]/h | INTRAMUSCULAR | Status: DC
  Administered 2016-03-23: 700 [IU]/h via INTRAVENOUS
  Administered 2016-03-24: 1100 [IU]/h via INTRAVENOUS
  Filled 2016-03-23 (×2): qty 250

## 2016-03-23 MED ORDER — FOLIC ACID 1 MG PO TABS: 1.0000 mg | ORAL_TABLET | Freq: Every day | ORAL | Status: DC

## 2016-03-23 NOTE — Progress Notes (Signed)
Preliminary results by tech - Venous Duplex Lower Ext. Completed. Negative for deep and superficial vein thrombosis in both legs.  Kijuan Gallicchio, BS, RDMS, RVT  

## 2016-03-23 NOTE — Progress Notes (Addendum)
PROGRESS NOTE  Catherine Hernandez ZOX:[id number] DOB: 08/12/1985 DOA: 03/19/2016 PCP: No PCP Per Patient   LOS: 3 days   Brief Narrative: 30 y.o. female with medical history significant of IVDU that is ongoing with heroin, the emergency room on 12/18 after being called to have positive blood cultures. She has a history of bacteremia and endocarditis as well as pneumonia due to septic emboli and she was in the hospital in September 2017, however left AMA prior to finishing her IV antibiotics. She presented to the ER on 12/12 with shortness of breath, [facility] was asked for admission however patient left AMA due to not receiving narcotic pain medications. She had blood cultures at that time which turned positive for Serratia and she was called to come back.  Assessment & Plan: Principal Problem:   Serratia sepsis (HCC) Active Problems:   Opioid use disorder, severe, dependence (HCC)   Heroin abuse   Symptomatic anemia   Multifocal pneumonia   Septicemia (HCC)   Septic embolism (HCC)   Severe tricuspid regurgitation   Chest pain   Sepsis with serratia bacteremia, multifocal pneumonia with cavitation / septic emboli - Infectious disease consulted, discussed with Dr. Algis Liming this morning, appreciate input - Patient with long-standing history of IV drug use, as well as noncompliance with medical treatment and left AMA several times in the past Infectious disease managing antibiotics, changed from Rocephin to Cipro 2-D echo- Concern for endocarditis-large   1 cm mobile mass noted on the anterior tricuspid leaflet with   severe tricuspid regurgitation concerning for vegetation, CTS consult -recommend palliative care and nonsurgical management CT chest to r/o PE , showed bilateral lower lobe PE, multiple cavitating and non-cavitating pneumonia, splenomegaly   Bilateral Pulmonary embolism Not a candidate for IVC filter given bacteremia venous Doppler Negative for deep and superficial vein thrombosis  in both legs   Probably not a candidate for long-term anticoagulation given noncompliance, start heparin gtt for now , need to decide on long term mx based on palliative recommendations  Discussed details of CT findings with the patient and ID   Endocarditis - Most recent TTE in August 2017 showed "2 large, very mobile vegetations are seen on the TV leaflets, the larger of which, prolapses through the tricuspid valve with at least mild TR. The largest vegetation measures 2.63 x 1.2 cm in diameter and very shaggy." - In August 2017 she had blood cultures with Enterococcus faecalis as well as MSSA, will likely need treatment for those, defer antibiotics to ID - 2d echo 12/19 with results as above  Drug abuse - counseled, discussed that she will not get any narcotic medication in the hospital, place on CIWA for withdrawal and use tramadol for pain  Anemia of chronic disease - Likely from ongoing smoldering sepsis, transfused with goal hemoglobin above 7, fecal occult is pending. Hemoglobin last week was 5.7 when she left AMA - Improved with transfusion, hemoglobin of 8.4   Protein calorie malnutrition  Albumin 1.7   DVT prophylaxis: SCD Code Status: Full code Family Communication: no family bedside Disposition Plan: TBD, agree with palliative care consultation given poor compliance and poor prognosis  Consultants:   ID  CTS   Procedures:   None   Antimicrobials:  Ceftriaxone 12/18 >>   Subjective:  tachycardiac , has chest pain  Objective: Vitals:   03/22/16 2300 03/23/16 0000 03/23/16 0335 03/23/16 0826  BP:  97/64 94/63 102/74  Pulse: (!) 126 (!) 119 (!) 103 (!) 108  Resp: Marland Kitchen)  35 (!) 27 (!) 28 (!) 35  Temp:  99.5 F (37.5 C) 97.7 F (36.5 C) 100 F (37.8 C)  TempSrc:  Oral Oral Oral  SpO2: 98% 97% 99% 98%  Weight:      Height:        Intake/Output Summary (Last 24 hours) at 03/23/16 1005 Last data filed at 03/23/16 0239  Gross per 24 hour  Intake               120 ml  Output                2 ml  Net              118 ml   Filed Weights   03/20/16 0000 03/20/16 0235  Weight: 43.1 kg (95 lb 0.3 oz) 43.5 kg (95 lb 14.4 oz)    Examination: Constitutional: ill appearing  Vitals:   03/22/16 2300 03/23/16 0000 03/23/16 0335 03/23/16 0826  BP:  97/64 94/63 102/74  Pulse: (!) 126 (!) 119 (!) 103 (!) 108  Resp: (!) 35 (!) 27 (!) 28 (!) 35  Temp:  99.5 F (37.5 C) 97.7 F (36.5 C) 100 F (37.8 C)  TempSrc:  Oral Oral Oral  SpO2: 98% 97% 99% 98%  Weight:      Height:       Eyes: PERRL Respiratory: coarse breath sounds, no wheezing   Cardiovascular: Regular rate and rhythm, soft SEM Abdomen: no tenderness. Bowel sounds positive.  Musculoskeletal: no clubbing / cyanosis.  Skin: no rashes, lesions, ulcers. No induration Neurologic: non focal   Data Reviewed: I have personally reviewed following labs and imaging studies  CBC:  Recent Labs Lab 03/19/16 2030 03/20/16 1148 03/20/16 1456 03/21/16 0513 03/22/16 0456 03/23/16 0602  WBC 14.4* 12.8*  --  14.8* 11.2* 12.5*  NEUTROABS 12.0*  --   --   --   --   --   HGB 4.4* 7.2* 6.9* 7.8* 7.8* 8.4*  HCT 15.5* 22.8* 21.4* 24.7* 24.5* 27.2*  MCV 75.2* 79.2  --  79.9 83.6 82.9  PLT 319 268  --  254 198 167   Basic Metabolic Panel:  Recent Labs Lab 03/19/16 2030 03/20/16 1148 03/21/16 0513 03/22/16 0456 03/23/16 0602  NA 134* 139 134* 135 137  K 3.6 3.2* 3.5 3.4* 6.0*  CL 104 108 105 103 107  CO2 20* 20* 19* 27 24  GLUCOSE 89 121* 106* 117* 81  BUN 9 6 5* 7 13  CREATININE 0.75 0.56 0.54 0.55 0.60  CALCIUM 7.8* 7.5* 7.6* 7.8* 7.7*   GFR: Estimated Creatinine Clearance: 71.9 mL/min (by C-G formula based on SCr of 0.6 mg/dL). Liver Function Tests:  Recent Labs Lab 03/19/16 2030 03/22/16 0456 03/23/16 0602  AST 28 28 42*  ALT 23 22 23   ALKPHOS 112 98 99  BILITOT 0.2* 0.6 1.1  PROT 6.9 6.6 7.7  ALBUMIN 1.9* 1.7* 1.8*   No results for input(s): LIPASE, AMYLASE in the  last 168 hours. No results for input(s): AMMONIA in the last 168 hours. Coagulation Profile: No results for input(s): INR, PROTIME in the last 168 hours. Cardiac Enzymes: No results for input(s): CKTOTAL, CKMB, CKMBINDEX, TROPONINI in the last 168 hours. BNP (last 3 results) No results for input(s): PROBNP in the last 8760 hours. HbA1C: No results for input(s): HGBA1C in the last 72 hours. CBG:  Recent Labs Lab 03/20/16 0906 03/20/16 1201  GLUCAP 117* 117*   Lipid Profile: No results for input(s):  CHOL, HDL, LDLCALC, TRIG, CHOLHDL, LDLDIRECT in the last 72 hours. Thyroid Function Tests: No results for input(s): TSH, T4TOTAL, FREET4, T3FREE, THYROIDAB in the last 72 hours. Anemia Panel: No results for input(s): VITAMINB12, FOLATE, FERRITIN, TIBC, IRON, RETICCTPCT in the last 72 hours. Urine analysis:    Component Value Date/Time   COLORURINE YELLOW 03/19/2016 2139   APPEARANCEUR CLEAR 03/19/2016 2139   LABSPEC 1.014 03/19/2016 2139   PHURINE 8.0 03/19/2016 2139   GLUCOSEU 50 (A) 03/19/2016 2139   HGBUR MODERATE (A) 03/19/2016 2139   BILIRUBINUR NEGATIVE 03/19/2016 2139   KETONESUR NEGATIVE 03/19/2016 2139   PROTEINUR 30 (A) 03/19/2016 2139   UROBILINOGEN 0.2 01/07/2012 1140   NITRITE NEGATIVE 03/19/2016 2139   LEUKOCYTESUR NEGATIVE 03/19/2016 2139   Sepsis Labs: Invalid input(s): PROCALCITONIN, LACTICIDVEN  Recent Results (from the past 240 hour(s))  Culture, blood (Routine X 2) w Reflex to ID Panel     Status: Abnormal   Collection Time: 03/14/16 10:05 AM  Result Value Ref Range Status   Specimen Description BLOOD RIGHT FOREARM  Final   Special Requests BOTTLES DRAWN AEROBIC AND ANAEROBIC  Final   Culture  Setup Time   Final    GRAM NEGATIVE RODS IN BOTH AEROBIC AND ANAEROBIC BOTTLES CRITICAL RESULT CALLED TO, READ BACK BY AND VERIFIED WITHKermit Balo RN [phone] 03/15/16 A BROWNING Performed at [facility]    Culture SERRATIA MARCESCENS (A)  Final    Report Status 03/17/2016 FINAL  Final   Organism ID, Bacteria SERRATIA MARCESCENS  Final      Susceptibility   Serratia marcescens - MIC*    CEFAZOLIN >=64 RESISTANT Resistant     CEFEPIME <=1 SENSITIVE Sensitive     CEFTAZIDIME <=1 SENSITIVE Sensitive     CEFTRIAXONE <=1 SENSITIVE Sensitive     CIPROFLOXACIN <=0.25 SENSITIVE Sensitive     GENTAMICIN <=1 SENSITIVE Sensitive     TRIMETH/SULFA <=20 SENSITIVE Sensitive     * SERRATIA MARCESCENS  Blood Culture ID Panel (Reflexed)     Status: Abnormal   Collection Time: 03/14/16 10:05 AM  Result Value Ref Range Status   Enterococcus species NOT DETECTED NOT DETECTED Final   Listeria monocytogenes NOT DETECTED NOT DETECTED Final   Staphylococcus species NOT DETECTED NOT DETECTED Final   Staphylococcus aureus NOT DETECTED NOT DETECTED Final   Streptococcus species NOT DETECTED NOT DETECTED Final   Streptococcus agalactiae NOT DETECTED NOT DETECTED Final   Streptococcus pneumoniae NOT DETECTED NOT DETECTED Final   Streptococcus pyogenes NOT DETECTED NOT DETECTED Final   Acinetobacter baumannii NOT DETECTED NOT DETECTED Final   Enterobacteriaceae species DETECTED (A) NOT DETECTED Final    Comment: CRITICAL RESULT CALLED TO, READ BACK BY AND VERIFIED WITH: L ADKINS RN [phone] 03/15/16 A BROWNING    Enterobacter cloacae complex NOT DETECTED NOT DETECTED Final   Escherichia coli NOT DETECTED NOT DETECTED Final   Klebsiella oxytoca NOT DETECTED NOT DETECTED Final   Klebsiella pneumoniae NOT DETECTED NOT DETECTED Final   Proteus species NOT DETECTED NOT DETECTED Final   Serratia marcescens DETECTED (A) NOT DETECTED Final    Comment: CRITICAL RESULT CALLED TO, READ BACK BY AND VERIFIED WITH: L ADKINS RN [phone] 03/15/16 A BROWNING    Carbapenem resistance NOT DETECTED NOT DETECTED Final   Haemophilus influenzae NOT DETECTED NOT DETECTED Final   Neisseria meningitidis NOT DETECTED NOT DETECTED Final   Pseudomonas aeruginosa NOT DETECTED NOT  DETECTED Final   Candida albicans NOT DETECTED NOT DETECTED Final  Candida glabrata NOT DETECTED NOT DETECTED Final   Candida krusei NOT DETECTED NOT DETECTED Final   Candida parapsilosis NOT DETECTED NOT DETECTED Final   Candida tropicalis NOT DETECTED NOT DETECTED Final    Comment: Performed at [facility]  Blood Culture (routine x 2)     Status: Abnormal   Collection Time: 03/15/16  9:20 AM  Result Value Ref Range Status   Specimen Description BLOOD LEFT HAND  Final   Special Requests IN PEDIATRIC BOTTLE 4CC  Final   Culture  Setup Time   Final    GRAM NEGATIVE RODS AEROBIC BOTTLE ONLY CRITICAL RESULT CALLED TO, READ BACK BY AND VERIFIED WITH: D MERRITT RN [phone] 03/16/16 A BROWNING Performed at [facility]    Culture SERRATIA MARCESCENS (A)  Final   Report Status 03/18/2016 FINAL  Final   Organism ID, Bacteria SERRATIA MARCESCENS  Final      Susceptibility   Serratia marcescens - MIC*    CEFAZOLIN >=64 RESISTANT Resistant     CEFEPIME <=1 SENSITIVE Sensitive     CEFTAZIDIME <=1 SENSITIVE Sensitive     CEFTRIAXONE <=1 SENSITIVE Sensitive     CIPROFLOXACIN <=0.25 SENSITIVE Sensitive     GENTAMICIN <=1 SENSITIVE Sensitive     TRIMETH/SULFA <=20 SENSITIVE Sensitive     * SERRATIA MARCESCENS  Blood Culture ID Panel (Reflexed)     Status: Abnormal   Collection Time: 03/15/16  9:20 AM  Result Value Ref Range Status   Enterococcus species NOT DETECTED NOT DETECTED Final   Listeria monocytogenes NOT DETECTED NOT DETECTED Final   Staphylococcus species NOT DETECTED NOT DETECTED Final   Staphylococcus aureus NOT DETECTED NOT DETECTED Final   Streptococcus species NOT DETECTED NOT DETECTED Final   Streptococcus agalactiae NOT DETECTED NOT DETECTED Final   Streptococcus pneumoniae NOT DETECTED NOT DETECTED Final   Streptococcus pyogenes NOT DETECTED NOT DETECTED Final   Acinetobacter baumannii NOT DETECTED NOT DETECTED Final   Enterobacteriaceae species DETECTED (A)  NOT DETECTED Final    Comment: CRITICAL RESULT CALLED TO, READ BACK BY AND VERIFIED WITH: D MERRITT RN [phone] 03/16/16 A BROWNING    Enterobacter cloacae complex NOT DETECTED NOT DETECTED Final   Escherichia coli NOT DETECTED NOT DETECTED Final   Klebsiella oxytoca NOT DETECTED NOT DETECTED Final   Klebsiella pneumoniae NOT DETECTED NOT DETECTED Final   Proteus species NOT DETECTED NOT DETECTED Final   Serratia marcescens DETECTED (A) NOT DETECTED Final    Comment: CRITICAL RESULT CALLED TO, READ BACK BY AND VERIFIED WITH: D MERRITT RN [phone] 03/16/16 A BROWNING    Carbapenem resistance NOT DETECTED NOT DETECTED Final   Haemophilus influenzae NOT DETECTED NOT DETECTED Final   Neisseria meningitidis NOT DETECTED NOT DETECTED Final   Pseudomonas aeruginosa NOT DETECTED NOT DETECTED Final   Candida albicans NOT DETECTED NOT DETECTED Final   Candida glabrata NOT DETECTED NOT DETECTED Final   Candida krusei NOT DETECTED NOT DETECTED Final   Candida parapsilosis NOT DETECTED NOT DETECTED Final   Candida tropicalis NOT DETECTED NOT DETECTED Final    Comment: Performed at [facility]  Blood Culture (routine x 2)     Status: None   Collection Time: 03/15/16  9:30 AM  Result Value Ref Range Status   Specimen Description BLOOD RT [facility]  Final   Special Requests BOTTLES DRAWN AEROBIC AND ANAEROBIC 5CC  Final   Culture   Final    NO GROWTH 5 DAYS Performed at [facility]  Report Status 03/20/2016 FINAL  Final  Urine culture     Status: Abnormal   Collection Time: 03/15/16  1:00 PM  Result Value Ref Range Status   Specimen Description URINE, RANDOM  Final   Special Requests NONE  Final   Culture (A)  Final    <10,000 COLONIES/mL INSIGNIFICANT GROWTH Performed at [facility]    Report Status 03/17/2016 FINAL  Final  Urine culture     Status: None   Collection Time: 03/15/16  1:00 PM  Result Value Ref Range Status   Specimen Description URINE, CLEAN CATCH  Final    Special Requests NONE  Final   Culture NO GROWTH Performed at [facility]   Final   Report Status 03/17/2016 FINAL  Final  MRSA PCR Screening     Status: Abnormal   Collection Time: 03/15/16  6:00 PM  Result Value Ref Range Status   MRSA by PCR POSITIVE (A) NEGATIVE Final    Comment:        The GeneXpert MRSA Assay (FDA approved for NASAL specimens only), is one component of a comprehensive MRSA colonization surveillance program. It is not intended to diagnose MRSA infection nor to guide or monitor treatment for MRSA infections. RESULT CALLED TO, READ BACK BY AND VERIFIED WITH: QUEEN RN AT 2149 ON 03/15/16 BY S.VANHOORNE   Blood Culture (routine x 2)     Status: Abnormal (Preliminary result)   Collection Time: 03/19/16  8:30 PM  Result Value Ref Range Status   Specimen Description BLOOD LEFT ARM  Final   Special Requests BOTTLES DRAWN AEROBIC AND ANAEROBIC 5CC EA  Final   Culture  Setup Time   Final    GRAM NEGATIVE RODS IN BOTH AEROBIC AND ANAEROBIC BOTTLES CRITICAL RESULT CALLED TO, READ BACK BY AND VERIFIED WITH: N BATCHELDER,PHARMD AT 1242 03/20/16 BY L BENFIELD    Culture (A)  Final    SERRATIA MARCESCENS SUSCEPTIBILITIES PERFORMED ON PREVIOUS CULTURE WITHIN THE LAST 5 DAYS.    Report Status PENDING  Incomplete  Urine culture     Status: Abnormal   Collection Time: 03/19/16  9:39 PM  Result Value Ref Range Status   Specimen Description URINE, RANDOM  Final   Special Requests NONE  Final   Culture MULTIPLE SPECIES PRESENT, SUGGEST RECOLLECTION (A)  Final   Report Status 03/21/2016 FINAL  Final  Blood Culture (routine x 2)     Status: Abnormal (Preliminary result)   Collection Time: 03/19/16 11:00 PM  Result Value Ref Range Status   Specimen Description BLOOD RIGHT ARM  Final   Special Requests IN PEDIATRIC BOTTLE 3CC  Final   Culture  Setup Time   Final    GRAM NEGATIVE RODS IN PEDIATRIC BOTTLE CRITICAL RESULT CALLED TO, READ BACK BY AND VERIFIED WITH:  N BATCHELDER,PHARMD AT 1242 03/20/16 BY L BENFIELD    Culture SERRATIA MARCESCENS (A)  Final   Report Status PENDING  Incomplete   Organism ID, Bacteria SERRATIA MARCESCENS  Final      Susceptibility   Serratia marcescens - MIC*    CEFAZOLIN >=64 RESISTANT Resistant     CEFEPIME <=1 SENSITIVE Sensitive     CEFTAZIDIME <=1 SENSITIVE Sensitive     CEFTRIAXONE <=1 SENSITIVE Sensitive     CIPROFLOXACIN <=0.25 SENSITIVE Sensitive     GENTAMICIN <=1 SENSITIVE Sensitive     TRIMETH/SULFA <=20 SENSITIVE Sensitive     * SERRATIA MARCESCENS  Blood Culture ID Panel (Reflexed)     Status:  Abnormal   Collection Time: 03/19/16 11:00 PM  Result Value Ref Range Status   Enterococcus species NOT DETECTED NOT DETECTED Final   Listeria monocytogenes NOT DETECTED NOT DETECTED Final   Staphylococcus species DETECTED (A) NOT DETECTED Final    Comment: CRITICAL RESULT CALLED TO, READ BACK BY AND VERIFIED WITH: N BATCHELDER,PHARMD AT 1242 03/20/16 BY L BENFIELD    Staphylococcus aureus NOT DETECTED NOT DETECTED Final   Methicillin resistance DETECTED (A) NOT DETECTED Final    Comment: CRITICAL RESULT CALLED TO, READ BACK BY AND VERIFIED WITH: N BATCHELDER,PHARMD AT 1242 03/20/16 BY L BENFIELD    Streptococcus species NOT DETECTED NOT DETECTED Final   Streptococcus agalactiae NOT DETECTED NOT DETECTED Final   Streptococcus pneumoniae NOT DETECTED NOT DETECTED Final   Streptococcus pyogenes NOT DETECTED NOT DETECTED Final   Acinetobacter baumannii NOT DETECTED NOT DETECTED Final   Enterobacteriaceae species DETECTED (A) NOT DETECTED Final    Comment: CRITICAL RESULT CALLED TO, READ BACK BY AND VERIFIED WITH: N BATCHELDER,PHARMD AT 1242 03/20/16 BY L BENFIELD    Enterobacter cloacae complex NOT DETECTED NOT DETECTED Final   Escherichia coli NOT DETECTED NOT DETECTED Final   Klebsiella oxytoca NOT DETECTED NOT DETECTED Final   Klebsiella pneumoniae NOT DETECTED NOT DETECTED Final   Proteus species NOT  DETECTED NOT DETECTED Final   Serratia marcescens DETECTED (A) NOT DETECTED Final    Comment: CRITICAL RESULT CALLED TO, READ BACK BY AND VERIFIED WITH: N BATCHELDER,PHARMD AT 1242 03/20/16 BY L BENFIELD    Carbapenem resistance NOT DETECTED NOT DETECTED Final   Haemophilus influenzae NOT DETECTED NOT DETECTED Final   Neisseria meningitidis NOT DETECTED NOT DETECTED Final   Pseudomonas aeruginosa NOT DETECTED NOT DETECTED Final   Candida albicans NOT DETECTED NOT DETECTED Final   Candida glabrata NOT DETECTED NOT DETECTED Final   Candida krusei NOT DETECTED NOT DETECTED Final   Candida parapsilosis NOT DETECTED NOT DETECTED Final   Candida tropicalis NOT DETECTED NOT DETECTED Final  MRSA PCR Screening     Status: Abnormal   Collection Time: 03/20/16  3:17 AM  Result Value Ref Range Status   MRSA by PCR POSITIVE (A) NEGATIVE Final    Comment:        The GeneXpert MRSA Assay (FDA approved for NASAL specimens only), is one component of a comprehensive MRSA colonization surveillance program. It is not intended to diagnose MRSA infection nor to guide or monitor treatment for MRSA infections. RESULT CALLED TO, READ BACK BY AND VERIFIED WITH: C. SCHILLER 0901 12.18.2017 N. MORRIS   Culture, blood (single)     Status: Abnormal (Preliminary result)   Collection Time: 03/20/16 11:33 PM  Result Value Ref Range Status   Specimen Description BLOOD RIGHT ARM  Final   Special Requests IN PEDIATRIC BOTTLE 2CC  Final   Culture  Setup Time   Final    GRAM NEGATIVE RODS IN PEDIATRIC BOTTLE CRITICAL VALUE NOTED.  VALUE IS CONSISTENT WITH PREVIOUSLY REPORTED AND CALLED VALUE.    Culture (A)  Final    SERRATIA MARCESCENS SUSCEPTIBILITIES PERFORMED ON PREVIOUS CULTURE WITHIN THE LAST 5 DAYS.    Report Status PENDING  Incomplete  Culture, blood (Routine X 2) w Reflex to ID Panel     Status: None (Preliminary result)   Collection Time: 03/21/16  5:14 AM  Result Value Ref Range Status    Specimen Description BLOOD LEFT ARM  Final   Special Requests BOTTLES DRAWN AEROBIC AND ANAEROBIC  Final  Culture NO GROWTH 1 DAY  Final   Report Status PENDING  Incomplete  Culture, blood (Routine X 2) w Reflex to ID Panel     Status: None (Preliminary result)   Collection Time: 03/21/16  5:35 AM  Result Value Ref Range Status   Specimen Description BLOOD RIGHT ARM  Final   Special Requests AEROBIC BOTTLE ONLY 5ML  Final   Culture NO GROWTH 1 DAY  Final   Report Status PENDING  Incomplete      Radiology Studies: Ct Angio Chest Pe W Or Wo Contrast  Result Date: 03/22/2016 CLINICAL DATA:  Shortness of breath, endocarditis. Bacteremia. History of IV drug abuse, sepsis, severe tricuspid regurgitation vegetation. EXAM: CT ANGIOGRAPHY CHEST WITH CONTRAST TECHNIQUE: Multidetector CT imaging of the chest was performed using the standard protocol during bolus administration of intravenous contrast. Multiplanar CT image reconstructions and MIPs were obtained to evaluate the vascular anatomy. CONTRAST:  56 cc Isovue 370 COMPARISON:  Chest radiograph March 19, 2016 and CT chest November 17, 2015. FINDINGS: CARDIOVASCULAR: Adequate contrast opacification of the pulmonary artery's. Main pulmonary artery is not enlarged. Bilateral lower lobe segmental to subsegmental occlusive pulmonary emboli with tapered appearance,. Mild cardiomegaly, dilated RIGHT atrium. No pericardial effusions. MEDIASTINUM/NODES: RIGHT hilar and subcarinal lymphadenopathy. LUNGS/PLEURA: Tracheobronchial tree is patent, no pneumothorax. Scattered small consolidations throughout the lungs bilaterally in addition to larger cavitating consolidations, some of which are new. Patchy ground-glass opacities. Trace pleural effusions. UPPER ABDOMEN: Included view of the abdomen is nonacute. Probable splenomegaly. MUSCULOSKELETAL: Patient is cachectic. 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 No acute osseous process. Review of the MIP images confirms the  above findings. IMPRESSION: Bilateral lower lobe pulmonary emboli with tapered appearance, this can be seen with chronic pulmonary emboli or possibly vasculopathy secondary to septic emboli. Multiple cavitating and non cavitating pneumonia/consolidation of varying ages consistent with septic emboli. Trace pleural effusions. Mild cardiomegaly and dilated RIGHT atrium. Suspected splenomegaly. Electronically Signed   By: Awilda Metroourtnay  Bloomer M.D.   On: 03/22/2016 17:10     Scheduled Meds: . Chlorhexidine Gluconate Cloth  6 each Topical Q0600  . ciprofloxacin  500 mg Oral BID  . folic acid  1 mg Oral Daily  . multivitamin with minerals  1 tablet Oral Daily  . mupirocin ointment  1 application Nasal BID  . sodium polystyrene  15 g Oral Once  . thiamine  100 mg Oral Daily   Or  . thiamine  100 mg Intravenous Daily   Continuous Infusions:     [facility] Pager [phone]  If 7PM-7AM, please contact night-coverage [email or web address] Password [email or web address] 03/23/2016, 10:05 AM

## 2016-03-23 NOTE — Progress Notes (Signed)
ANTICOAGULATION CONSULT NOTE - Initial Consult  Pharmacy Consult for Heparin Indication: pulmonary embolus  No Known Allergies  Patient Measurements: Height: 5\' 5"  (165.1 cm) Weight: 95 lb 14.4 oz (43.5 kg) IBW/kg (Calculated) : 57 Heparin Dosing Weight: 43.5 kg  Vital Signs: Temp: 100 F (37.8 C) (12/21 0826) Temp Source: Oral (12/21 0826) BP: 108/65 (12/21 1100) Pulse Rate: 137 (12/21 1100)  Labs:  Recent Labs  03/21/16 0513 03/22/16 0456 03/23/16 0602  HGB 7.8* 7.8* 8.4*  HCT 24.7* 24.5* 27.2*  PLT 254 198 167  CREATININE 0.54 0.55 0.60    Estimated Creatinine Clearance: 71.9 mL/min (by C-G formula based on SCr of 0.6 mg/dL).   Medical History: Past Medical History:  Diagnosis Date  . Anemia   . CAP (community acquired pneumonia) 11/16/2015  . Childhood asthma   . Enterococcal bacteremia   . Heroin abuse   . Hyponatremia   . Pneumonia 2007  . Polysubstance abuse     Assessment:   30 yr old female to begin IV heparin for pulmonary embolus/septic emboli.   Hgb only 4.4 on admit 12/17, transfused, now up to 8.4.  Platelet count 391 on admission, has trended down to 167K today, possibly due to infection.  On Cipro 500 mg PO BID for Serratia bacteremia and endocarditis due to prior non-compliance and ongoing IV drug abuse.  Noted not a good candidate for long-term anticoagulation or IVC filter, but plan IV heparin for now.   Goal of Therapy:  Heparin level 0.3-0.7 units/ml Monitor platelets by anticoagulation protocol: Yes   Plan:   Heparin 2500 units IV x 1 (~ 60 units/kg)  Heparin drip to begin at 700 units/hr (~16 units/kg/hr)  Heparin level ~6 hrs after drip begins.  Daily heparin level and CBC.  Follow up anticoagulation plans.  Catherine Hernandez, Catherine Hernandez, [place] Pager: [phone] 03/23/2016,1:01 PM

## 2016-03-23 NOTE — Progress Notes (Signed)
ANTICOAGULATION CONSULT NOTE - Follow Up Consult  Pharmacy Consult for Heparin Indication: pulmonary embolus  No Known Allergies  Patient Measurements: Height: 5\' 5"  (165.1 cm) Weight: 95 lb 14.4 oz (43.5 kg) IBW/kg (Calculated) : 57  Vital Signs: Temp: 98.6 F (37 C) (12/21 2017) Temp Source: Oral (12/21 2017) BP: 100/71 (12/21 2000) Pulse Rate: 110 (12/21 2000)  Labs:  Recent Labs  03/21/16 0513 03/22/16 0456 03/23/16 0602 03/23/16 2206  HGB 7.8* 7.8* 8.4*  --   HCT 24.7* 24.5* 27.2*  --   PLT 254 198 167  --   HEPARINUNFRC  --   --   --  <0.10*  CREATININE 0.54 0.55 0.60  --     Estimated Creatinine Clearance: 71.9 mL/min (by C-G formula based on SCr of 0.6 mg/dL).  Assessment: Heparin for PE/septic emboli, initial heparin level is undetectable, no issues per RN.   Goal of Therapy:  Heparin level 0.3-0.7 units/ml Monitor platelets by anticoagulation protocol: Yes   Plan:  -Heparin 1500 units BOLUS -Inc heparin drip to 900 units/hr -0700 HL  Abran DukeLedford, Chau Sawin 03/23/2016,10:59 PM

## 2016-03-23 NOTE — Progress Notes (Signed)
Initial Nutrition Assessment  DOCUMENTATION CODES:   Severe malnutrition in context of chronic illness, Underweight  INTERVENTION:    Ensure Enlive po BID, each supplement provides 350 kcal and 20 grams of protein  NUTRITION DIAGNOSIS:   Malnutrition related to chronic illness as evidenced by severe depletion of body fat, severe depletion of muscle mass.  GOAL:   Patient will meet greater than or equal to 90% of their needs  MONITOR:   PO intake, Supplement acceptance, Labs, Skin  REASON FOR ASSESSMENT:   Consult Assessment of nutrition requirement/status  ASSESSMENT:   30 y.o.femalewith medical history significant of IVDU that is ongoing with heroin. She has a history of bacteremia and endocarditis as well as pneumonia due to septic emboli and she was in the hospital in September 2017, however left AMA prior to finishing her IV antibiotics. She presented to the ER on 12/12 with shortness of breath, then she left AMA due to not receiving narcotic pain medications. She had blood cultures at that time which turned positive for Serratia and she was called to come back.   Patient reports that her usual weight is ~110 lbs (unsure when) and that she has been having trouble chewing because she is so tired. She says she has been eating poorly since admission, however, RN reports that patient has been eating 100% of meals. Nutrition-Focused physical exam completed. Findings are severe fat depletion, severe muscle depletion, and no edema.  Patient with severe PCM. Labs reviewed: potassium elevated at 6. Medications reviewed and include folic acid, MVI, thiamine.  Diet Order:  Diet regular Room service appropriate? Yes; Fluid consistency: Thin  Skin:  R hip open wound, small scabs all over body, stage I to sacrum, stage II to R hip  Last BM:  12/19  Height:   Ht Readings from Last 1 Encounters:  03/20/16 5\' 5"  (1.651 m)    Weight:   Wt Readings from Last 1 Encounters:   03/20/16 95 lb 14.4 oz (43.5 kg)    Ideal Body Weight:  56.8 kg  BMI:  Body mass index is 15.96 kg/m.  Estimated Nutritional Needs:   Kcal:  1450-1700  Protein:  70-85 gm  Fluid:  >/= 1.5 L  EDUCATION NEEDS:   Education needs addressed  Joaquin CourtsKimberly Ailine Hefferan, RD, LDN, CNSC Pager [phone] After Hours Pager [phone]

## 2016-03-24 DIAGNOSIS — Z7189 Other specified counseling: Secondary | ICD-10-CM

## 2016-03-24 DIAGNOSIS — Z515 Encounter for palliative care: Secondary | ICD-10-CM

## 2016-03-24 LAB — CBC
HEMATOCRIT: 23.3 % — AB (ref 36.0–46.0)
HEMOGLOBIN: 7.1 g/dL — AB (ref 12.0–15.0)
MCH: 25.6 pg — AB (ref 26.0–34.0)
MCHC: 30.5 g/dL (ref 30.0–36.0)
MCV: 84.1 fL (ref 78.0–100.0)
Platelets: 157 10*3/uL (ref 150–400)
RBC: 2.77 MIL/uL — ABNORMAL LOW (ref 3.87–5.11)
RDW: 20.6 % — ABNORMAL HIGH (ref 11.5–15.5)
WBC: 10.5 10*3/uL (ref 4.0–10.5)

## 2016-03-24 LAB — COMPREHENSIVE METABOLIC PANEL
ALBUMIN: 1.7 g/dL — AB (ref 3.5–5.0)
ALK PHOS: 89 U/L (ref 38–126)
ALT: 22 U/L (ref 14–54)
ANION GAP: 6 (ref 5–15)
AST: 23 U/L (ref 15–41)
BILIRUBIN TOTAL: 0.3 mg/dL (ref 0.3–1.2)
BUN: 9 mg/dL (ref 6–20)
CALCIUM: 7.7 mg/dL — AB (ref 8.9–10.3)
CO2: 28 mmol/L (ref 22–32)
Chloride: 101 mmol/L (ref 101–111)
Creatinine, Ser: 0.67 mg/dL (ref 0.44–1.00)
GFR calc Af Amer: >60 mL/min (ref >60)
GFR calc non Af Amer: >60 mL/min (ref >60)
GLUCOSE: 95 mg/dL (ref 65–99)
Potassium: 3.6 mmol/L (ref 3.5–5.1)
Sodium: 135 mmol/L (ref 135–145)
TOTAL PROTEIN: 6.3 g/dL — AB (ref 6.5–8.1)

## 2016-03-24 LAB — CULTURE, BLOOD (SINGLE)

## 2016-03-24 LAB — HEPARIN LEVEL (UNFRACTIONATED)
Heparin Unfractionated: <0.1 IU/mL — ABNORMAL LOW (ref 0.30–0.70)
Heparin Unfractionated: <0.1 IU/mL — ABNORMAL LOW (ref 0.30–0.70)

## 2016-03-24 LAB — PREPARE RBC (CROSSMATCH)

## 2016-03-24 MED ORDER — HEPARIN BOLUS VIA INFUSION
2000.0000 [IU] | Freq: Once | INTRAVENOUS | Status: AC
Start: 2016-03-24 — End: 2016-03-24
  Administered 2016-03-24: 2000 [IU] via INTRAVENOUS
  Filled 2016-03-24: qty 2000

## 2016-03-24 MED ORDER — APIXABAN 5 MG PO TABS: 5.0000 mg | ORAL_TABLET | Freq: Two times a day (BID) | ORAL | Status: DC

## 2016-03-24 MED ORDER — APIXABAN 5 MG PO TABS
10.0000 mg | ORAL_TABLET | Freq: Two times a day (BID) | ORAL | Status: DC
  Administered 2016-03-24 – 2016-03-28 (×8): 10 mg via ORAL
  Filled 2016-03-24 (×9): qty 2

## 2016-03-24 MED ORDER — SODIUM CHLORIDE 0.9 % IV SOLN
Freq: Once | INTRAVENOUS | Status: AC
Start: 2016-03-24 — End: 2016-03-24
  Administered 2016-03-24: 17:00:00 via INTRAVENOUS

## 2016-03-24 MED ORDER — FERROUS GLUCONATE 324 (38 FE) MG PO TABS
324.0000 mg | ORAL_TABLET | Freq: Three times a day (TID) | ORAL | Status: DC
  Administered 2016-03-24 – 2016-03-28 (×12): 324 mg via ORAL
  Filled 2016-03-24 (×12): qty 1

## 2016-03-24 NOTE — Progress Notes (Signed)
Pt showing that she has Medicaid- both Xarelto and Eliquis would be covered under Medicaid benefits.

## 2016-03-24 NOTE — Consult Note (Signed)
Consultation Note Date: 03/24/2016   Patient Name: Catherine Hernandez  DOB: 1985/04/12  MRN: [id number]  Age / Sex: 30 y.o., female  PCP: No Pcp Per Patient Referring Physician: Leatha Gilding, MD  Reason for Consultation: Establishing GOC and emotional support  HPI/Patient Profile: 30 y.o. female admitted on 03/19/2016  with medical history significant of IVDU that is ongoing with heroin and cocaine.  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  She presented on the 12th of this month to [facility] hospital with multifocal PNA.  Started on rocephin and azithromycin.  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   The patient is severely protein malnourished with albumin 1.6    Per cardiothoracic surgery  the patient would not survive heart valve surgery and should be treated with antibiotics and emphasis on comfort care  Patient faces complicated emotional and psychological  Issues, along with facing her own mortality.  Clinical Assessment and Goals of Care:  This NP Lorinda Creed reviewed medical records, received report from team, assessed the patient and then meet at the patient's bedside along with her SO "Neil"Ervin Zmmerman  to discuss diagnosis and  prognosis, GOC, and options.  Lloyd Huger speaks to his sadness and frustration 2/2 to his involvement with Ellyse, "I do what I can but she makes her own decsions".  I con't help her until she stop doing drugs"  A discussion was had today regarding advanced directive and HPOA.  Concepts specific to code status,continued IV antibiotics and rehospitalization and disposition  was had.  The difference  between a aggressive medical intervention path  and a palliative comfort care path for this patient at this time was had.  Values and goals of care important to patient and family were attempted to be elicited.  SUMMARY OF RECOMMENDATIONS    Code Status/Advance Care Planning:  Full code- discussed in detail the importance of establishing a HPOA and realistic discussion regarding a resuscitation event, and known poor outcomes in silimilar patients.  Patient tells me she doesn't want her mother to be her decision maker (mother has addiction issues also) and she is thinking about documenting Lloyd Huger to be her HPOA.  I explained that spiritual care could help her with that.   Additional Recommendations (Limitations, Scope, Preferences):  At this time patient verbalizes her desire for all available and offered medical interventions to prolong life.  She speaks to her desire for " things to be different".  She tells me she is "going to stop using.  I don't use IV drugs anymore"  When questioned she tells me she snorts  Psycho-social/Spiritual:   This is a sad, unfortunate situation for this young women tangled in addiction.  There is no easy solution.  Support Chee as she will allow  and take it one day at a time.  We discussed self responsibility; decisions and consequences.  Prognosis:   Unable to determine- long term poor prognosis  Discharge Planning:  Pending outcomes.  The patient is homeless at the present time, living in a tent.  On a previous discharge      Primary Diagnoses: Present on Admission: . Septicemia (HCC) . Opioid use disorder, severe, dependence (HCC) . Serratia sepsis (HCC) . Heroin abuse . Multifocal pneumonia . Symptomatic anemia   I have reviewed the medical record, interviewed the patient and family, and examined the patient. The following aspects are pertinent.  Past Medical History:  Diagnosis Date  . Anemia   . CAP (community acquired pneumonia)  11/16/2015  . Childhood asthma   . Enterococcal bacteremia   . Heroin abuse   . Hyponatremia   . Pneumonia 2007  . Polysubstance abuse    Social History   Social History  . Marital status: Single    Spouse name: N/A  . Number of children: N/A  . Years of education: N/A   Occupational History  . unemployed, former Film/video editorwarehouse laborer    Social History Main Topics  . Smoking status: Current Every Day Smoker    Packs/day: 0.50    Years: 10.00    Types: Cigarettes  . Smokeless tobacco: Never Used  . Alcohol use No  . Drug use:     Types: IV, Cocaine, Heroin, Oxycodone     Comment: 11/17/2015 "been only doing pills lately to try to get offf H"  . Sexual activity: Yes    Birth control/ protection: Condom   Other Topics Concern  . None   Social History Narrative  . None   Family History  Problem Relation Age of Onset  . Anesthesia problems Neg Hx   . Hypotension Neg Hx   . Malignant hyperthermia Neg Hx   . Pseudochol deficiency Neg Hx    Scheduled Meds: . Chlorhexidine Gluconate Cloth  6 each Topical Q0600  . ciprofloxacin  500 mg Oral BID  . feeding supplement (ENSURE ENLIVE)  237 mL Oral BID BM  . folic acid  1 mg Oral Daily  . multivitamin with minerals  1 tablet Oral Daily  . mupirocin ointment  1 application Nasal BID  . thiamine  100 mg Oral Daily   Or  . thiamine  100 mg Intravenous Daily   Continuous Infusions: . heparin 900 Units/hr (03/24/16 0600)   PRN Meds:.acetaminophen, LORazepam **OR** LORazepam, ondansetron (ZOFRAN) IV, traMADol Medications Prior to Admission:  Prior to Admission medications   Medication Sig Start Date End Date Taking? Authorizing Provider  acetaminophen (TYLENOL) 325 MG tablet Take 325-650 mg by mouth every 6 (six) hours as needed (for chest pain).   Yes Historical Provider, MD  amoxicillin-clavulanate (AUGMENTIN) 875-125 MG tablet Take 1 tablet by mouth 2 (two) times daily. Patient not taking: Reported on 03/20/2016 12/19/15    Catarina Hartshornavid Tat, MD  feeding supplement (BOOST / RESOURCE BREEZE) LIQD Take 1 Container by mouth 2 (two) times daily between meals. Patient not taking: Reported on 03/20/2016 12/02/15   Clydia LlanoMutaz Elmahi, MD   No Known Allergies Review of Systems  Physical Exam  Constitutional: She appears cachectic. She appears ill.  Cardiovascular: Tachycardia present.   Pulmonary/Chest: She has decreased breath sounds in the right lower field and the left lower field.  Skin: Skin is warm and dry.    Vital Signs: BP (!) 87/53 (BP Location: Left Arm)   Pulse Marland Kitchen(!)  112   Temp 99.3 F (37.4 C) (Oral)   Resp (!) 26   Ht 5\' 5"  (1.651 m)   Wt 43.5 kg (95 lb 14.4 oz)   SpO2 99%   BMI 15.96 kg/m  Pain Assessment: 0-10 POSS *See Group Information*: S-Acceptable,Sleep, easy to arouse Pain Score: 2    SpO2: SpO2: 99 % O2 Device:SpO2: 99 % O2 Flow Rate: .   IO: Intake/output summary:  Intake/Output Summary (Last 24 hours) at 03/24/16 0745 Last data filed at 03/24/16 0600  Gross per 24 hour  Intake            446.6 ml  Output              300 ml  Net            146.6 ml    LBM: Last BM Date: 03/21/16 Baseline Weight: Weight: 43.1 kg (95 lb 0.3 oz) Most recent weight: Weight: 43.5 kg (95 lb 14.4 oz)     Palliative Assessment/Data:  30 % at best     Time In: 1100 Time Out: 1215 Time Total: 75 min Greater than 50%  of this time was spent counseling and coordinating care related to the above assessment and plan.  Signed by: Lorinda CreedLARACH, Karenann Mcgrory, NP   Please contact Palliative Medicine Team phone at [phone] for questions and concerns.  For individual provider: See Loretha StaplerAmion

## 2016-03-24 NOTE — Progress Notes (Signed)
ANTICOAGULATION CONSULT NOTE - Follow Up Consult  Pharmacy Consult for Heparin --> Eliquis Indication: pulmonary embolus  No Known Allergies  Patient Measurements: Height: 5\' 5"  (165.1 cm) Weight: 95 lb 14.4 oz (43.5 kg) IBW/kg (Calculated) : 57  Vital Signs: Temp: 99.8 F (37.7 C) (12/22 1654) Temp Source: Oral (12/22 1654) BP: 95/63 (12/22 1654) Pulse Rate: 117 (12/22 1654)  Labs:  Recent Labs  03/22/16 0456 03/23/16 0602 03/23/16 2206 03/24/16 0724 03/24/16 1357  HGB 7.8* 8.4*  --  7.1*  --   HCT 24.5* 27.2*  --  23.3*  --   PLT 198 167  --  157  --   HEPARINUNFRC  --   --  <0.10* <0.10* <0.10*  CREATININE 0.55 0.60  --  0.67  --     Estimated Creatinine Clearance: 71.9 mL/min (by C-G formula based on SCr of 0.67 mg/dL).  Assessment: 30 yr old female on IV heparin for pulmonary embolus/septic emboli now to transition to Eliquis. Noted not to be a good candidate for long-term anticoagulation or IVC filter due to non-compliance and ongoing IV drug abuse. No bleeding noted, Hgb 7.1 (receiving blood), platelets are normal but have decreased since admit.  Goal of Therapy:  Full anticoagulation Monitor platelets by anticoagulation protocol: Yes   Plan:  Discontinue heparin drip Begin Eliquis 10 mg PO bid for 7 days then 5 mg PO bid Monitor for s/sx of bleeding Pharmacy will continue to follow peripherally  Loura BackJennifer [place], PharmD, BCPS Clinical Pharmacist Phone for tonight [phone] Main pharmacy - [phone] 03/24/2016 5:05 PM

## 2016-03-24 NOTE — Progress Notes (Signed)
ANTICOAGULATION CONSULT NOTE - Follow Up Consult  Pharmacy Consult for Heparin Indication: pulmonary embolus  No Known Allergies  Patient Measurements: Height: 5\' 5"  (165.1 cm) Weight: 95 lb 14.4 oz (43.5 kg) IBW/kg (Calculated) : 57 Heparin Dosing Weight: 43.5 kg  Vital Signs: Temp: 99.1 F (37.3 C) (12/22 0831) Temp Source: Oral (12/22 0831) BP: 98/64 (12/22 0831) Pulse Rate: 116 (12/22 0831)  Labs:  Recent Labs  03/22/16 0456 03/23/16 0602 03/23/16 2206 03/24/16 0724  HGB 7.8* 8.4*  --  7.1*  HCT 24.5* 27.2*  --  23.3*  PLT 198 167  --  157  HEPARINUNFRC  --   --  <0.10* <0.10*  CREATININE 0.55 0.60  --  0.67    Estimated Creatinine Clearance: 71.9 mL/min (by C-G formula based on SCr of 0.67 mg/dL).  Assessment: 30 yr old female to begin IV heparin for pulmonary embolus/septic emboli.   Hgb only 4.4 on admit 12/17, transfused, up to 8.4 on 12/21, down to 7.1 today.  Platelet count 391 on admission, has trended down to 157K today, possibly due to infection.  On Cipro 500 mg PO BID for Serratia bacteremia and endocarditis due to prior non-compliance and ongoing IV drug abuse.  Noted not a good candidate for long-term anticoagulation or IVC filter, but plan IV heparin for now, begun 12/21.   Initial heparin level undetectable on 700 units/hr.  Received 1500 units IV bolus and rate increased to 900 units/hr -> level is still undetectable.  No known infusion problems. No known bleeding.  Goal of Therapy:  Heparin level 0.3-0.7 units/ml Monitor platelets by anticoagulation protocol: Yes   Plan:   Heparin 2000 units IV bolus.  Increase heparin drip to 1100 units/hr.  Heparin level ~ 6 hrs after rate change.  Daily heparin level and CBC.  Monitor for any s/sx bleeding.  Dennie FettersEgan, Awad Gladd Donovan, RPh Pager: [phone] 03/24/2016,8:52 AM

## 2016-03-24 NOTE — Progress Notes (Addendum)
PROGRESS NOTE  Catherine Hernandez WUJ:[id number] DOB: July 15, 1985 DOA: 03/19/2016 PCP: No PCP Per Patient   LOS: 4 days   Brief Narrative: 30 y.o. female with medical history significant of IVDU that is ongoing with heroin, the emergency room on 12/18 after being called to have positive blood cultures. She has a history of bacteremia and endocarditis as well as pneumonia due to septic emboli and she was in the hospital in September 2017, however left AMA prior to finishing her IV antibiotics. She presented to the ER on 12/12 with shortness of breath, [facility] was asked for admission however patient left AMA due to not receiving narcotic pain medications. She had blood cultures at that time which turned positive for Serratia and she was called to come back.  Assessment & Plan: Principal Problem:   Serratia sepsis (HCC) Active Problems:   Opioid use disorder, severe, dependence (HCC)   Heroin abuse   Symptomatic anemia   Multifocal pneumonia   Septicemia (HCC)   Septic embolism (HCC)   Severe tricuspid regurgitation   Chest pain   Sepsis with serratia bacteremia, multifocal pneumonia with cavitation / septic emboli - Infectious disease consulted, appreciate input, patient now transitioned to by mouth ciprofloxacin - Patient with long-standing history of IV drug use, as well as noncompliance with medical treatment and left AMA several times in the past  Bilateral Pulmonary embolism - venous Doppler Negative for deep and superficial vein thrombosis in both legs   - Probably not a candidate for long-term anticoagulation given noncompliance, discussed with patient at bedside, she has chest pain and is subjectively short of breath, she promised some follow-up and states that she will be compliant, will start NOAC once CM evaluates benefits. Consult care management to establish patient as an outpatient probably with [facility] as well  Endocarditis - Most recent TTE in  August 2017 showed "2 large, very mobile vegetations are seen on the TV leaflets, the larger of which, prolapses through the tricuspid valve with at least mild TR. The largest vegetation measures 2.63 x 1.2 cm in diameter and very shaggy." - Since she did not finish treatment then, likely she has ongoing endocarditis. - In August 2017 she had blood cultures with Enterococcus faecalis as well as MSSA,   Drug abuse - counseled, discussed that she will not get any narcotic medication in the hospital, place on CIWA for withdrawal and use tramadol for pain  Symptomatic anemia, anemia of chronic disease, iron deficiency anemia - Likely from ongoing smoldering sepsis, transfused with goal hemoglobin above 7, fecal occult is pending. Hemoglobin last week was 5.7 when she left AMA - Hemoglobin dropped again to 7.1 today, transfuse 1 unit of packed red blood cells, start iron supplementation - no bleeding, start NOAC and monitor  Protein calorie malnutrition  - Albumin 1.7   DVT prophylaxis: SCD Code Status: Full code Family Communication: no family bedside Disposition Plan: TBD  Consultants:   ID  Procedures:   None   Antimicrobials:  Ceftriaxone 12/18 >>   Subjective: - Complains of chest pain and difficulty breathing  Objective: Vitals:   03/23/16 2017 03/23/16 2346 03/24/16 0409 03/24/16 0831  BP:  101/71 (!) 87/53 98/64  Pulse:  (!) 123 (!) 112 (!) 116  Resp:  (!) 21 (!) 26 (!) 28  Temp: 98.6 F (37 C) 99 F (37.2 C) 99.3 F (37.4 C) 99.1 F (37.3 C)  TempSrc: Oral Oral Oral Oral  SpO2:  97% 99% 99%  Weight:      Height:        Intake/Output Summary (Last 24 hours) at 03/24/16 1140 Last data filed at 03/24/16 0600  Gross per 24 hour  Intake            446.6 ml  Output              300 ml  Net            146.6 ml   Filed Weights   03/20/16 0000 03/20/16 0235  Weight: 43.1 kg (95 lb 0.3 oz) 43.5 kg (95 lb 14.4 oz)    Examination: Constitutional: ill  appearing  Vitals:   03/23/16 2017 03/23/16 2346 03/24/16 0409 03/24/16 0831  BP:  101/71 (!) 87/53 98/64  Pulse:  (!) 123 (!) 112 (!) 116  Resp:  (!) 21 (!) 26 (!) 28  Temp: 98.6 F (37 C) 99 F (37.2 C) 99.3 F (37.4 C) 99.1 F (37.3 C)  TempSrc: Oral Oral Oral Oral  SpO2:  97% 99% 99%  Weight:      Height:       Eyes: PERRL Respiratory: coarse breath sounds, no wheezing   Cardiovascular: Regular rate and rhythm, soft SEM Abdomen: no tenderness. Bowel sounds positive.  Musculoskeletal: no clubbing / cyanosis.  Skin: no rashes, lesions, ulcers. No induration Neurologic: non focal   Data Reviewed: I have personally reviewed following labs and imaging studies  CBC:  Recent Labs Lab 03/19/16 2030 03/20/16 1148 03/20/16 1456 03/21/16 0513 03/22/16 0456 03/23/16 0602 03/24/16 0724  WBC 14.4* 12.8*  --  14.8* 11.2* 12.5* 10.5  NEUTROABS 12.0*  --   --   --   --   --   --   HGB 4.4* 7.2* 6.9* 7.8* 7.8* 8.4* 7.1*  HCT 15.5* 22.8* 21.4* 24.7* 24.5* 27.2* 23.3*  MCV 75.2* 79.2  --  79.9 83.6 82.9 84.1  PLT 319 268  --  254 198 167 157   Basic Metabolic Panel:  Recent Labs Lab 03/20/16 1148 03/21/16 0513 03/22/16 0456 03/23/16 0602 03/24/16 0724  NA 139 134* 135 137 135  K 3.2* 3.5 3.4* 6.0* 3.6  CL 108 105 103 107 101  CO2 20* 19* 27 24 28   GLUCOSE 121* 106* 117* 81 95  BUN 6 5* 7 13 9   CREATININE 0.56 0.54 0.55 0.60 0.67  CALCIUM 7.5* 7.6* 7.8* 7.7* 7.7*   GFR: Estimated Creatinine Clearance: 71.9 mL/min (by C-G formula based on SCr of 0.67 mg/dL). Liver Function Tests:  Recent Labs Lab 03/19/16 2030 03/22/16 0456 03/23/16 0602 03/24/16 0724  AST 28 28 42* 23  ALT 23 22 23 22   ALKPHOS 112 98 99 89  BILITOT 0.2* 0.6 1.1 0.3  PROT 6.9 6.6 7.7 6.3*  ALBUMIN 1.9* 1.7* 1.8* 1.7*   No results for input(s): LIPASE, AMYLASE in the last 168 hours. No results for input(s): AMMONIA in the last 168 hours. Coagulation Profile: No results for input(s):  INR, PROTIME in the last 168 hours. Cardiac Enzymes: No results for input(s): CKTOTAL, CKMB, CKMBINDEX, TROPONINI in the last 168 hours. BNP (last 3 results) No results for input(s): PROBNP in the last 8760 hours. HbA1C: No results for input(s): HGBA1C in the last 72 hours. CBG:  Recent Labs Lab 03/20/16 0906 03/20/16 1201  GLUCAP 117* 117*   Lipid Profile: No results for input(s): CHOL, HDL, LDLCALC, TRIG, CHOLHDL, LDLDIRECT in the last 72 hours. Thyroid Function Tests: No results for input(s): TSH, T4TOTAL, FREET4,  T3FREE, THYROIDAB in the last 72 hours. Anemia Panel: No results for input(s): VITAMINB12, FOLATE, FERRITIN, TIBC, IRON, RETICCTPCT in the last 72 hours. Urine analysis:    Component Value Date/Time   COLORURINE YELLOW 03/19/2016 2139   APPEARANCEUR CLEAR 03/19/2016 2139   LABSPEC 1.014 03/19/2016 2139   PHURINE 8.0 03/19/2016 2139   GLUCOSEU 50 (A) 03/19/2016 2139   HGBUR MODERATE (A) 03/19/2016 2139   BILIRUBINUR NEGATIVE 03/19/2016 2139   KETONESUR NEGATIVE 03/19/2016 2139   PROTEINUR 30 (A) 03/19/2016 2139   UROBILINOGEN 0.2 01/07/2012 1140   NITRITE NEGATIVE 03/19/2016 2139   LEUKOCYTESUR NEGATIVE 03/19/2016 2139   Sepsis Labs: Invalid input(s): PROCALCITONIN, LACTICIDVEN  Recent Results (from the past 240 hour(s))  Blood Culture (routine x 2)     Status: Abnormal   Collection Time: 03/15/16  9:20 AM  Result Value Ref Range Status   Specimen Description BLOOD LEFT HAND  Final   Special Requests IN PEDIATRIC BOTTLE 4CC  Final   Culture  Setup Time   Final    GRAM NEGATIVE RODS AEROBIC BOTTLE ONLY CRITICAL RESULT CALLED TO, READ BACK BY AND VERIFIED WITH: D MERRITT RN [phone] 03/16/16 A BROWNING Performed at [facility]    Culture SERRATIA MARCESCENS (A)  Final   Report Status 03/18/2016 FINAL  Final   Organism ID, Bacteria SERRATIA MARCESCENS  Final      Susceptibility   Serratia marcescens - MIC*    CEFAZOLIN >=64 RESISTANT Resistant      CEFEPIME <=1 SENSITIVE Sensitive     CEFTAZIDIME <=1 SENSITIVE Sensitive     CEFTRIAXONE <=1 SENSITIVE Sensitive     CIPROFLOXACIN <=0.25 SENSITIVE Sensitive     GENTAMICIN <=1 SENSITIVE Sensitive     TRIMETH/SULFA <=20 SENSITIVE Sensitive     * SERRATIA MARCESCENS  Blood Culture ID Panel (Reflexed)     Status: Abnormal   Collection Time: 03/15/16  9:20 AM  Result Value Ref Range Status   Enterococcus species NOT DETECTED NOT DETECTED Final   Listeria monocytogenes NOT DETECTED NOT DETECTED Final   Staphylococcus species NOT DETECTED NOT DETECTED Final   Staphylococcus aureus NOT DETECTED NOT DETECTED Final   Streptococcus species NOT DETECTED NOT DETECTED Final   Streptococcus agalactiae NOT DETECTED NOT DETECTED Final   Streptococcus pneumoniae NOT DETECTED NOT DETECTED Final   Streptococcus pyogenes NOT DETECTED NOT DETECTED Final   Acinetobacter baumannii NOT DETECTED NOT DETECTED Final   Enterobacteriaceae species DETECTED (A) NOT DETECTED Final    Comment: CRITICAL RESULT CALLED TO, READ BACK BY AND VERIFIED WITH: D MERRITT RN [phone] 03/16/16 A BROWNING    Enterobacter cloacae complex NOT DETECTED NOT DETECTED Final   Escherichia coli NOT DETECTED NOT DETECTED Final   Klebsiella oxytoca NOT DETECTED NOT DETECTED Final   Klebsiella pneumoniae NOT DETECTED NOT DETECTED Final   Proteus species NOT DETECTED NOT DETECTED Final   Serratia marcescens DETECTED (A) NOT DETECTED Final    Comment: CRITICAL RESULT CALLED TO, READ BACK BY AND VERIFIED WITH: D MERRITT RN [phone] 03/16/16 A BROWNING    Carbapenem resistance NOT DETECTED NOT DETECTED Final   Haemophilus influenzae NOT DETECTED NOT DETECTED Final   Neisseria meningitidis NOT DETECTED NOT DETECTED Final   Pseudomonas aeruginosa NOT DETECTED NOT DETECTED Final   Candida albicans NOT DETECTED NOT DETECTED Final   Candida glabrata NOT DETECTED NOT DETECTED Final   Candida krusei NOT DETECTED NOT DETECTED Final   Candida  parapsilosis NOT DETECTED NOT DETECTED Final   Candida  tropicalis NOT DETECTED NOT DETECTED Final    Comment: Performed [facility]  Blood Culture (routine x 2)     Status: None   Collection Time: 03/15/16  9:30 AM  Result Value Ref Range Status   Specimen Description [facility]LOOD [facility]  Final   Special Requests BOTTLES DRAWN AEROBIC AND ANAEROBIC 5CC  Final   Culture   Final    NO GROWTH 5 DAYS Performed at [facility] [place]    Report Status 03/20/2016 FINAL  Final  Urine culture     Status: Abnormal   Collection Time: 03/15/16  1:00 PM  Result Value Ref Range Status   Specimen Description URINE, RANDOM  Final   Special Requests NONE  Final   Culture (A)  Final    <10,000 COLONIES/mL INSIGNIFICANT GROWTH Performed at [facility]    Report Status 03/17/2016 FINAL  Final  Urine culture     Status: None   Collection Time: 03/15/16  1:00 PM  Result Value Ref Range Status   Specimen Description URINE, CLEAN CATCH  Final   Special Requests NONE  Final   Culture NO GROWTH Performed at [facility]   Final   Report Status 03/17/2016 FINAL  Final  MRSA PCR Screening     Status: Abnormal   Collection Time: 03/15/16  6:00 PM  Result Value Ref Range Status   MRSA by PCR POSITIVE (A) NEGATIVE Final    Comment:        The GeneXpert MRSA Assay (FDA approved for NASAL specimens only), is one component of a comprehensive MRSA colonization surveillance program. It is not intended to diagnose MRSA infection nor to guide or monitor treatment for MRSA infections. RESULT CALLED TO, READ BACK BY AND VERIFIED WITH: QUEEN RN AT 2149 ON 03/15/16 BY S.VANHOORNE   Blood Culture (routine x 2)     Status: Abnormal   Collection Time: 03/19/16  8:30 PM  Result Value Ref Range Status   Specimen Description BLOOD LEFT ARM  Final   Special Requests BOTTLES DRAWN AEROBIC AND ANAEROBIC 5CC EA  Final   Culture  Setup Time   Final    GRAM NEGATIVE RODS IN BOTH AEROBIC AND  ANAEROBIC BOTTLES CRITICAL RESULT CALLED TO, READ BACK BY AND VERIFIED WITH: N BATCHELDER,PHARMD AT 1242 03/20/16 BY L BENFIELD    Culture (A)  Final    SERRATIA MARCESCENS SUSCEPTIBILITIES PERFORMED ON PREVIOUS CULTURE WITHIN THE LAST 5 DAYS.    Report Status 03/23/2016 FINAL  Final  Urine culture     Status: Abnormal   Collection Time: 03/19/16  9:39 PM  Result Value Ref Range Status   Specimen Description URINE, RANDOM  Final   Special Requests NONE  Final   Culture MULTIPLE SPECIES PRESENT, SUGGEST RECOLLECTION (A)  Final   Report Status 03/21/2016 FINAL  Final  Blood Culture (routine x 2)     Status: Abnormal   Collection Time: 03/19/16 11:00 PM  Result Value Ref Range Status   Specimen Description BLOOD RIGHT ARM  Final   Special Requests IN PEDIATRIC BOTTLE 3CC  Final   Culture  Setup Time   Final    GRAM NEGATIVE RODS IN PEDIATRIC BOTTLE CRITICAL RESULT CALLED TO, READ BACK BY AND VERIFIED WITH: N BATCHELDER,PHARMD AT 1242 03/20/16 BY L BENFIELD    Culture SERRATIA MARCESCENS (A)  Final   Report Status 03/23/2016 FINAL  Final   Organism ID, Bacteria SERRATIA MARCESCENS  Final  Susceptibility   Serratia marcescens - MIC*    CEFAZOLIN >=64 RESISTANT Resistant     CEFEPIME <=1 SENSITIVE Sensitive     CEFTAZIDIME <=1 SENSITIVE Sensitive     CEFTRIAXONE <=1 SENSITIVE Sensitive     CIPROFLOXACIN <=0.25 SENSITIVE Sensitive     GENTAMICIN <=1 SENSITIVE Sensitive     TRIMETH/SULFA <=20 SENSITIVE Sensitive     * SERRATIA MARCESCENS  Blood Culture ID Panel (Reflexed)     Status: Abnormal   Collection Time: 03/19/16 11:00 PM  Result Value Ref Range Status   Enterococcus species NOT DETECTED NOT DETECTED Final   Listeria monocytogenes NOT DETECTED NOT DETECTED Final   Staphylococcus species DETECTED (A) NOT DETECTED Final    Comment: CRITICAL RESULT CALLED TO, READ BACK BY AND VERIFIED WITH: N BATCHELDER,PHARMD AT 1242 03/20/16 BY L BENFIELD    Staphylococcus aureus NOT  DETECTED NOT DETECTED Final   Methicillin resistance DETECTED (A) NOT DETECTED Final    Comment: CRITICAL RESULT CALLED TO, READ BACK BY AND VERIFIED WITH: N BATCHELDER,PHARMD AT 1242 03/20/16 BY L BENFIELD    Streptococcus species NOT DETECTED NOT DETECTED Final   Streptococcus agalactiae NOT DETECTED NOT DETECTED Final   Streptococcus pneumoniae NOT DETECTED NOT DETECTED Final   Streptococcus pyogenes NOT DETECTED NOT DETECTED Final   Acinetobacter baumannii NOT DETECTED NOT DETECTED Final   Enterobacteriaceae species DETECTED (A) NOT DETECTED Final    Comment: CRITICAL RESULT CALLED TO, READ BACK BY AND VERIFIED WITH: N BATCHELDER,PHARMD AT 1242 03/20/16 BY L BENFIELD    Enterobacter cloacae complex NOT DETECTED NOT DETECTED Final   Escherichia coli NOT DETECTED NOT DETECTED Final   Klebsiella oxytoca NOT DETECTED NOT DETECTED Final   Klebsiella pneumoniae NOT DETECTED NOT DETECTED Final   Proteus species NOT DETECTED NOT DETECTED Final   Serratia marcescens DETECTED (A) NOT DETECTED Final    Comment: CRITICAL RESULT CALLED TO, READ BACK BY AND VERIFIED WITH: N BATCHELDER,PHARMD AT 1242 03/20/16 BY L BENFIELD    Carbapenem resistance NOT DETECTED NOT DETECTED Final   Haemophilus influenzae NOT DETECTED NOT DETECTED Final   Neisseria meningitidis NOT DETECTED NOT DETECTED Final   Pseudomonas aeruginosa NOT DETECTED NOT DETECTED Final   Candida albicans NOT DETECTED NOT DETECTED Final   Candida glabrata NOT DETECTED NOT DETECTED Final   Candida krusei NOT DETECTED NOT DETECTED Final   Candida parapsilosis NOT DETECTED NOT DETECTED Final   Candida tropicalis NOT DETECTED NOT DETECTED Final  MRSA PCR Screening     Status: Abnormal   Collection Time: 03/20/16  3:17 AM  Result Value Ref Range Status   MRSA by PCR POSITIVE (A) NEGATIVE Final    Comment:        The GeneXpert MRSA Assay (FDA approved for NASAL specimens only), is one component of a comprehensive MRSA  colonization surveillance program. It is not intended to diagnose MRSA infection nor to guide or monitor treatment for MRSA infections. RESULT CALLED TO, READ BACK BY AND VERIFIED WITH: C. SCHILLER 0901 12.18.2017 N. MORRIS   Culture, blood (single)     Status: Abnormal   Collection Time: 03/20/16 11:33 PM  Result Value Ref Range Status   Specimen Description BLOOD RIGHT ARM  Final   Special Requests IN PEDIATRIC BOTTLE 2CC  Final   Culture  Setup Time   Final    GRAM NEGATIVE RODS IN PEDIATRIC BOTTLE CRITICAL VALUE NOTED.  VALUE IS CONSISTENT WITH PREVIOUSLY REPORTED AND CALLED VALUE.    Culture (A)  Final  SERRATIA MARCESCENS SUSCEPTIBILITIES PERFORMED ON PREVIOUS CULTURE WITHIN THE LAST 5 DAYS.    Report Status 03/24/2016 FINAL  Final  Culture, blood (Routine X 2) w Reflex to ID Panel     Status: None (Preliminary result)   Collection Time: 03/21/16  5:14 AM  Result Value Ref Range Status   Specimen Description BLOOD LEFT ARM  Final   Special Requests BOTTLES DRAWN AEROBIC AND ANAEROBIC  Final   Culture NO GROWTH 2 DAYS  Final   Report Status PENDING  Incomplete  Culture, blood (Routine X 2) w Reflex to ID Panel     Status: None (Preliminary result)   Collection Time: 03/21/16  5:35 AM  Result Value Ref Range Status   Specimen Description BLOOD RIGHT ARM  Final   Special Requests AEROBIC BOTTLE ONLY  Final   Culture NO GROWTH 2 DAYS  Final   Report Status PENDING  Incomplete      Radiology Studies: Ct Angio Chest Pe W Or Wo Contrast  Result Date: 03/22/2016 CLINICAL DATA:  Shortness of breath, endocarditis. Bacteremia. History of IV drug abuse, sepsis, severe tricuspid regurgitation vegetation. EXAM: CT ANGIOGRAPHY CHEST WITH CONTRAST TECHNIQUE: Multidetector CT imaging of the chest was performed using the standard protocol during bolus administration of intravenous contrast. Multiplanar CT image reconstructions and MIPs were obtained to evaluate the vascular  anatomy. CONTRAST:  56 cc Isovue 370 COMPARISON:  Chest radiograph March 19, 2016 and CT chest November 17, 2015. FINDINGS: CARDIOVASCULAR: Adequate contrast opacification of the pulmonary artery's. Main pulmonary artery is not enlarged. Bilateral lower lobe segmental to subsegmental occlusive pulmonary emboli with tapered appearance,. Mild cardiomegaly, dilated RIGHT atrium. No pericardial effusions. MEDIASTINUM/NODES: RIGHT hilar and subcarinal lymphadenopathy. LUNGS/PLEURA: Tracheobronchial tree is patent, no pneumothorax. Scattered small consolidations throughout the lungs bilaterally in addition to larger cavitating consolidations, some of which are new. Patchy ground-glass opacities. Trace pleural effusions. UPPER ABDOMEN: Included view of the abdomen is nonacute. Probable splenomegaly. MUSCULOSKELETAL: Patient is cachectic. 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 No acute osseous process. Review of the MIP images confirms the above findings. IMPRESSION: Bilateral lower lobe pulmonary emboli with tapered appearance, this can be seen with chronic pulmonary emboli or possibly vasculopathy secondary to septic emboli. Multiple cavitating and non cavitating pneumonia/consolidation of varying ages consistent with septic emboli. Trace pleural effusions. Mild cardiomegaly and dilated RIGHT atrium. Suspected splenomegaly. Electronically Signed   By: Awilda Metro M.D.   On: 03/22/2016 17:10     Scheduled Meds: . Chlorhexidine Gluconate Cloth  6 each Topical Q0600  . ciprofloxacin  500 mg Oral BID  . feeding supplement (ENSURE ENLIVE)  237 mL Oral BID BM  . folic acid  1 mg Oral Daily  . multivitamin with minerals  1 tablet Oral Daily  . thiamine  100 mg Oral Daily   Continuous Infusions: . heparin 1,100 Units/hr (03/24/16 1052)     Pamella Pert, MD, PhD [facility] Pager [phone]  If 7PM-7AM, please contact night-coverage [email or web address] Password [email or web address] 03/24/2016, 11:40 AM

## 2016-03-25 LAB — TYPE AND SCREEN
BLOOD PRODUCT EXPIRATION DATE: 201801022359
ISSUE DATE / TIME: 201712221641
UNIT TYPE AND RH: 5100

## 2016-03-25 NOTE — Progress Notes (Signed)
[facility]                                                                              Patient Demographics  Catherine Hernandez, is a 30 y.o. female, DOB - [birth year], ZOX:[id number]  Admit date - 03/19/2016   Admitting Physician Hillary BowJared M Gardner, DO  Outpatient Primary MD for the patient is No PCP Per Patient  Outpatient specialists:   LOS - 5  days    Chief Complaint  Patient presents with  . Chest Pain  . 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 She has a history of bacteremia and endocarditis as well as pneumonia due to septic emboli and she was in the hospital in September 2017, however left AMA prior to finishing her IV antibiotics. She presented to the ER on 12/12 with shortness of breath, [facility] was asked for admission however patient left AMA due to not receiving narcotic pain medications. She had blood cultures at that time which turned positive for Serratia and she was called to come back.   Assessment & Plan     Sepsis with serratia bacteremia, multifocal pneumonia with cavitation / septic emboli - Infectious disease was consulted, patient now transitioned to oral ciprofloxacin - Patient with long-standing history of IV drug use, as well as noncompliance with medical treatment and left AMA several times in the past  Bilateral Pulmonary embolism - venous Doppler Negative for deep and superficial vein thrombosis in both legs - Probably not a candidate for long-term anticoagulation given noncompliance, discussed with patient at bedside, she has chest pain and is subjectively short of breath, she promised some follow-up and states that she will be compliant - Per case manager, patient showed that she has Medicaid, both xarelto and eliquis would be covered under Medicaid  benefits. Eliquis started. -  Consult care management to establish patient as an outpatient with [facility]   Endocarditis - Most recent TTE in August 2017 showed "2 large, very mobile vegetations are seen on the TV leaflets, the larger of which, prolapses through the tricuspid valve with atleast mild TR. 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 - In August 2017 she had blood cultures with Enterococcus faecalis as well as MSSA   Drug abuse - counseled, discussed that she will not get any narcotic medication in the hospital, place on CIWA for withdrawal and use tramadol for pain  Symptomatic anemia, anemia of chronic disease, iron deficiency anemia - Likely from ongoing smoldering sepsis, transfused with goal hemoglobin above 7, fecal occult is pending. Hemoglobin last week was 5.7 when she left AMA - Transfused on 12/22, CBC pending.   Protein calorie malnutrition  - Albumin 1.7  Code Status: full  DVT Prophylaxis:  eliquis Family Communication: Discussed in detail with the patient, all imaging results, lab results explained to the  patient   Disposition Plan:   Time Spent in minutes 25 minutes  Procedures:  CTA chest   Consultants:   ID  Antimicrobials:   Ceftriaxone 12/18   Medications  Scheduled Meds: . apixaban  10 mg Oral Q12H   Followed by  . [START ON 03/31/2016] apixaban  5 mg Oral Q12H  . ciprofloxacin  500 mg Oral BID  . feeding supplement (ENSURE ENLIVE)  237 mL Oral BID BM  . ferrous gluconate  324 mg Oral TID WC  . folic acid  1 mg Oral Daily  . multivitamin with minerals  1 tablet Oral Daily  . thiamine  100 mg Oral Daily   Continuous Infusions: PRN Meds:.acetaminophen, LORazepam **OR** LORazepam, ondansetron (ZOFRAN) IV, traMADol   Antibiotics   Anti-infectives    Start     Dose/Rate Route Frequency  Ordered Stop   03/23/16 0800  ciprofloxacin (CIPRO) tablet 500 mg     500 mg Oral 2 times daily 03/22/16 1332     03/20/16 0600  cefTRIAXone (ROCEPHIN) 2 g in dextrose 5 % 50 mL IVPB  Status:  Discontinued     2 g 100 mL/hr over 30 Minutes Intravenous Every 24 hours 03/20/16 0008 03/22/16 1332   03/19/16 2215  ceFEPIme (MAXIPIME) 2 g in dextrose 5 % 50 mL IVPB  Status:  Discontinued     2 g 100 mL/hr over 30 Minutes Intravenous Every 8 hours 03/19/16 2203 03/20/16 0008        Subjective:   Catherine Hernandez was seen and examined today.Complaining of pruritic chest pain, bilateral shoulder pain. Patient denies dizziness,  abdominal pain, N/V/D/C, new weakness, numbess, tingling. No acute events overnight.    Objective:   Vitals:   03/24/16 2304 03/25/16 0336 03/25/16 0720 03/25/16 0800  BP: 102/70 103/67 (!) 101/59 103/62  Pulse: (!) 121 (!) 120 (!) 116 (!) 118  Resp: (!) 35 (!) 23 (!) 24 (!) 23  Temp: 100 F (37.8 C) 99.8 F (37.7 C) 98.8 F (37.1 C)   TempSrc: Axillary Oral Oral   SpO2: 97% 97% 97% 98%  Weight:      Height:        Intake/Output Summary (Last 24 hours) at 03/25/16 1208 Last data filed at 03/25/16 0800  Gross per 24 hour  Intake             1028 ml  Output                0 ml  Net             1028 ml     Wt Readings from Last 3 Encounters:  03/20/16 43.5 kg (95 lb 14.4 oz)  12/16/15 43.1 kg (95 lb)  12/02/15 43.1 kg (95 lb 0.3 oz)     Exam  General: Alert and oriented x 3, NAD  HEENT:    Neck:   Cardiovascular: S1 S2 auscultated, no rubs, murmurs or gallops. Regular rate and rhythm.  Respiratory: Clear to auscultation bilaterally, no wheezing, rales or rhonchi  Gastrointestinal: Soft, nontender, nondistended, + bowel sounds  Ext: no cyanosis clubbing or edema  Neuro: AAOx3, Cr N's II- XII. Strength 5/5 upper and lower extremities bilaterally  Skin: No rashes  Psych: Normal affect and demeanor, alert and oriented x3    Data Reviewed:   I have personally reviewed following labs and imaging studies  Micro Results Recent Results (from the past 240 hour(s))  Urine culture     Status: Abnormal  Collection Time: 03/15/16  1:00 PM  Result Value Ref Range Status   Specimen Description URINE, RANDOM  Final   Special Requests NONE  Final   Culture (A)  Final    <10,000 COLONIES/mL INSIGNIFICANT GROWTH [facility]    Report Status 03/17/2016 FINAL  Final  Urine culture     Status: None   Collection Time: 03/15/16  1:00 PM  Result Value Ref Range Status   Specimen Description URINE, CLEAN CATCH  Final   Special Requests NONE  Final   Culture NO GROWTH [facility]   Final   Report Status 03/17/2016 FINAL  Final  MRSA PCR Screening     Status: Abnormal   Collection Time: 03/15/16  6:00 PM  Result Value Ref Range Status   MRSA by PCR POSITIVE (A) NEGATIVE Final    Comment:        The GeneXpert MRSA Assay (FDA approved for NASAL specimens only), is one component of a comprehensive MRSA colonization surveillance program. It is not intended to diagnose MRSA infection nor to guide or monitor treatment for MRSA infections. RESULT CALLED TO, READ BACK BY AND VERIFIED WITH: QUEEN RN AT 2149 ON 03/15/16 BY S.VANHOORNE   Blood Culture (routine x 2)     Status: Abnormal   Collection Time: 03/19/16  8:30 PM  Result Value Ref Range Status   Specimen Description BLOOD LEFT ARM  Final   Special Requests BOTTLES DRAWN AEROBIC AND ANAEROBIC 5CC EA  Final   Culture  Setup Time   Final    GRAM NEGATIVE RODS IN BOTH AEROBIC AND ANAEROBIC BOTTLES CRITICAL RESULT CALLED TO, READ BACK BY AND VERIFIED WITH: N BATCHELDER,PHARMD AT 1242 03/20/16 BY L BENFIELD    Culture (A)  Final    SERRATIA MARCESCENS SUSCEPTIBILITIES PERFORMED ON PREVIOUS CULTURE WITHIN THE LAST 5 DAYS.    Report Status 03/23/2016 FINAL  Final  Urine culture     Status: Abnormal   Collection Time: 03/19/16  9:39 PM    Result Value Ref Range Status   Specimen Description URINE, RANDOM  Final   Special Requests NONE  Final   Culture MULTIPLE SPECIES PRESENT, SUGGEST RECOLLECTION (A)  Final   Report Status 03/21/2016 FINAL  Final  Blood Culture (routine x 2)     Status: Abnormal   Collection Time: 03/19/16 11:00 PM  Result Value Ref Range Status   Specimen Description BLOOD RIGHT ARM  Final   Special Requests IN PEDIATRIC BOTTLE 3CC  Final   Culture  Setup Time   Final    GRAM NEGATIVE RODS IN PEDIATRIC BOTTLE CRITICAL RESULT CALLED TO, READ BACK BY AND VERIFIED WITH: N BATCHELDER,PHARMD AT 1242 03/20/16 BY L BENFIELD    Culture SERRATIA MARCESCENS (A)  Final   Report Status 03/23/2016 FINAL  Final   Organism ID, Bacteria SERRATIA MARCESCENS  Final      Susceptibility   Serratia marcescens - MIC*    CEFAZOLIN >=64 RESISTANT Resistant     CEFEPIME <=1 SENSITIVE Sensitive     CEFTAZIDIME <=1 SENSITIVE Sensitive     CEFTRIAXONE <=1 SENSITIVE Sensitive     CIPROFLOXACIN <=0.25 SENSITIVE Sensitive     GENTAMICIN <=1 SENSITIVE Sensitive     TRIMETH/SULFA <=20 SENSITIVE Sensitive     * SERRATIA MARCESCENS  Blood Culture ID Panel (Reflexed)     Status: Abnormal   Collection Time: 03/19/16 11:00 PM  Result Value Ref Range Status   Enterococcus species NOT  DETECTED NOT DETECTED Final   Listeria monocytogenes NOT DETECTED NOT DETECTED Final   Staphylococcus species DETECTED (A) NOT DETECTED Final    Comment: CRITICAL RESULT CALLED TO, READ BACK BY AND VERIFIED WITH: N BATCHELDER,PHARMD AT 1242 03/20/16 BY L BENFIELD    Staphylococcus aureus NOT DETECTED NOT DETECTED Final   Methicillin resistance DETECTED (A) NOT DETECTED Final    Comment: CRITICAL RESULT CALLED TO, READ BACK BY AND VERIFIED WITH: N BATCHELDER,PHARMD AT 1242 03/20/16 BY L BENFIELD    Streptococcus species NOT DETECTED NOT DETECTED Final   Streptococcus agalactiae NOT DETECTED NOT DETECTED Final   Streptococcus pneumoniae NOT  DETECTED NOT DETECTED Final   Streptococcus pyogenes NOT DETECTED NOT DETECTED Final   Acinetobacter baumannii NOT DETECTED NOT DETECTED Final   Enterobacteriaceae species DETECTED (A) NOT DETECTED Final    Comment: CRITICAL RESULT CALLED TO, READ BACK BY AND VERIFIED WITH: N BATCHELDER,PHARMD AT 1242 03/20/16 BY L BENFIELD    Enterobacter cloacae complex NOT DETECTED NOT DETECTED Final   Escherichia coli NOT DETECTED NOT DETECTED Final   Klebsiella oxytoca NOT DETECTED NOT DETECTED Final   Klebsiella pneumoniae NOT DETECTED NOT DETECTED Final   Proteus species NOT DETECTED NOT DETECTED Final   Serratia marcescens DETECTED (A) NOT DETECTED Final    Comment: CRITICAL RESULT CALLED TO, READ BACK BY AND VERIFIED WITH: N BATCHELDER,PHARMD AT 1242 03/20/16 BY L BENFIELD    Carbapenem resistance NOT DETECTED NOT DETECTED Final   Haemophilus influenzae NOT DETECTED NOT DETECTED Final   Neisseria meningitidis NOT DETECTED NOT DETECTED Final   Pseudomonas aeruginosa NOT DETECTED NOT DETECTED Final   Candida albicans NOT DETECTED NOT DETECTED Final   Candida glabrata NOT DETECTED NOT DETECTED Final   Candida krusei NOT DETECTED NOT DETECTED Final   Candida parapsilosis NOT DETECTED NOT DETECTED Final   Candida tropicalis NOT DETECTED NOT DETECTED Final  MRSA PCR Screening     Status: Abnormal   Collection Time: 03/20/16  3:17 AM  Result Value Ref Range Status   MRSA by PCR POSITIVE (A) NEGATIVE Final    Comment:        The GeneXpert MRSA Assay (FDA approved for NASAL specimens only), is one component of a comprehensive MRSA colonization surveillance program. It is not intended to diagnose MRSA infection nor to guide or monitor treatment for MRSA infections. RESULT CALLED TO, READ BACK BY AND VERIFIED WITH: C. SCHILLER 0901 12.18.2017 N. MORRIS   Culture, blood (single)     Status: Abnormal   Collection Time: 03/20/16 11:33 PM  Result Value Ref Range Status   Specimen Description  BLOOD RIGHT ARM  Final   Special Requests IN PEDIATRIC BOTTLE 2CC  Final   Culture  Setup Time   Final    GRAM NEGATIVE RODS IN PEDIATRIC BOTTLE CRITICAL VALUE NOTED.  VALUE IS CONSISTENT WITH PREVIOUSLY REPORTED AND CALLED VALUE.    Culture (A)  Final    SERRATIA MARCESCENS SUSCEPTIBILITIES PERFORMED ON PREVIOUS CULTURE WITHIN THE LAST 5 DAYS.    Report Status 03/24/2016 FINAL  Final  Culture, blood (Routine X 2) w Reflex to ID Panel     Status: None (Preliminary result)   Collection Time: 03/21/16  5:14 AM  Result Value Ref Range Status   Specimen Description BLOOD LEFT ARM  Final   Special Requests BOTTLES DRAWN AEROBIC AND ANAEROBIC  Final   Culture NO GROWTH 3 DAYS  Final   Report Status PENDING  Incomplete  Culture, blood (Routine X  2) w Reflex to ID Panel     Status: None (Preliminary result)   Collection Time: 03/21/16  5:35 AM  Result Value Ref Range Status   Specimen Description BLOOD RIGHT ARM  Final   Special Requests AEROBIC BOTTLE ONLY  Final   Culture NO GROWTH 3 DAYS  Final   Report Status PENDING  Incomplete    Radiology Reports Dg Chest 2 View  Result Date: 03/15/2016 CLINICAL DATA:  Pneumonia, shortness of breath. EXAM: CHEST  2 VIEW COMPARISON:  Chest x-ray 03/14/2016. FINDINGS: Bilateral multifocal cavitary areas are again noted and unchanged since prior study. Small left pleural effusion. Heart is normal size. No acute bony abnormality. IMPRESSION: Stable multifocal cavitary pulmonary airspace opacities, most likely multifocal cavitary pneumonia. Septic emboli and malignancy are possible and not excluded. Electronically Signed   By: Charlett Nose M.D.   On: 03/15/2016 10:31   Dg Chest 2 View  Result Date: 03/14/2016 CLINICAL DATA:  Chest pain. EXAM: CHEST  2 VIEW COMPARISON:  12/16/2015. FINDINGS: Mediastinum hilar structures stable. Multifocal bilateral cavitary pulmonary lesions are noted. Multifocal cavitary pneumonia could present this fashion. Of  malignancy would be less likely. Small left pleural effusion. No pneumothorax . Heart size normal. IMPRESSION: Multifocal cavitary pulmonary infiltrates most consistent with multifocal cavitary pneumonia. Chest CT may prove useful for further evaluation. These results will be called to the ordering clinician or representative by the Radiologist Assistant, and communication documented in the PACS or zVision Dashboard. Electronically Signed   By: Maisie Fus  Register   On: 03/14/2016 10:07   Ct Angio Chest Pe W Or Wo Contrast  Result Date: 03/22/2016 CLINICAL DATA:  Shortness of breath, endocarditis. Bacteremia. History of IV drug abuse, sepsis, severe tricuspid regurgitation vegetation. EXAM: CT ANGIOGRAPHY CHEST WITH CONTRAST TECHNIQUE: Multidetector CT imaging of the chest was performed using the standard protocol during bolus administration of intravenous contrast. Multiplanar CT image reconstructions and MIPs were obtained to evaluate the vascular anatomy. CONTRAST:  56 cc Isovue 370 COMPARISON:  Chest radiograph March 19, 2016 and CT chest November 17, 2015. FINDINGS: CARDIOVASCULAR: Adequate contrast opacification of the pulmonary artery's. Main pulmonary artery is not enlarged. Bilateral lower lobe segmental to subsegmental occlusive pulmonary emboli with tapered appearance,. Mild cardiomegaly, dilated RIGHT atrium. No pericardial effusions. MEDIASTINUM/NODES: RIGHT hilar and subcarinal lymphadenopathy. LUNGS/PLEURA: Tracheobronchial tree is patent, no pneumothorax. Scattered small consolidations throughout the lungs bilaterally in addition to larger cavitating consolidations, some of which are new. Patchy ground-glass opacities. Trace pleural effusions. UPPER ABDOMEN: Included view of the abdomen is nonacute. Probable splenomegaly. MUSCULOSKELETAL: Patient is cachectic. 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 No acute osseous process. Review of the MIP images confirms the above findings. IMPRESSION: Bilateral lower lobe  pulmonary emboli with tapered appearance, this can be seen with chronic pulmonary emboli or possibly vasculopathy secondary to septic emboli. Multiple cavitating and non cavitating pneumonia/consolidation of varying ages consistent with septic emboli. Trace pleural effusions. Mild cardiomegaly and dilated RIGHT atrium. Suspected splenomegaly. Electronically Signed   By: Awilda Metro M.D.   On: 03/22/2016 17:10   Dg Chest Port 1 View  Result Date: 03/20/2016 CLINICAL DATA:  Chest pain, shortness of breath, cough for 2 weeks. History of pneumonia and blood infection. EXAM: PORTABLE CHEST 1 VIEW COMPARISON:  Chest radiograph March 15, 2016. FINDINGS: Cardiomediastinal silhouette is normal. Central lucencies within LEFT midlung zone airspace opacity. Patchy additional airspace opacities with cavitation. Trace LEFT pleural effusion. No pneumothorax. Soft tissue planes and included osseous structures are nonsuspicious. IMPRESSION: Increasing  cavitation of multifocal pneumonia. Trace LEFT pleural effusion. Electronically Signed   By: Awilda Metro M.D.   On: 03/20/2016 00:25    Lab Data:  CBC:  Recent Labs Lab 03/19/16 2030 03/20/16 1148 03/20/16 1456 03/21/16 0513 03/22/16 0456 03/23/16 0602 03/24/16 0724  WBC 14.4* 12.8*  --  14.8* 11.2* 12.5* 10.5  NEUTROABS 12.0*  --   --   --   --   --   --   HGB 4.4* 7.2* 6.9* 7.8* 7.8* 8.4* 7.1*  HCT 15.5* 22.8* 21.4* 24.7* 24.5* 27.2* 23.3*  MCV 75.2* 79.2  --  79.9 83.6 82.9 84.1  PLT 319 268  --  254 198 167 157   Basic Metabolic Panel:  Recent Labs Lab 03/20/16 1148 03/21/16 0513 03/22/16 0456 03/23/16 0602 03/24/16 0724  NA 139 134* 135 137 135  K 3.2* 3.5 3.4* 6.0* 3.6  CL 108 105 103 107 101  CO2 20* 19* 27 24 28   GLUCOSE 121* 106* 117* 81 95  BUN 6 5* 7 13 9   CREATININE 0.56 0.54 0.55 0.60 0.67  CALCIUM 7.5* 7.6* 7.8* 7.7* 7.7*   GFR: Estimated Creatinine Clearance: 71.9 mL/min (by C-G formula based on SCr of 0.67  mg/dL). Liver Function Tests:  Recent Labs Lab 03/19/16 2030 03/22/16 0456 03/23/16 0602 03/24/16 0724  AST 28 28 42* 23  ALT 23 22 23 22   ALKPHOS 112 98 99 89  BILITOT 0.2* 0.6 1.1 0.3  PROT 6.9 6.6 7.7 6.3*  ALBUMIN 1.9* 1.7* 1.8* 1.7*   No results for input(s): LIPASE, AMYLASE in the last 168 hours. No results for input(s): AMMONIA in the last 168 hours. Coagulation Profile: No results for input(s): INR, PROTIME in the last 168 hours. Cardiac Enzymes: No results for input(s): CKTOTAL, CKMB, CKMBINDEX, TROPONINI in the last 168 hours. BNP (last 3 results) No results for input(s): PROBNP in the last 8760 hours. HbA1C: No results for input(s): HGBA1C in the last 72 hours. CBG:  Recent Labs Lab 03/20/16 0906 03/20/16 1201  GLUCAP 117* 117*   Lipid Profile: No results for input(s): CHOL, HDL, LDLCALC, TRIG, CHOLHDL, LDLDIRECT in the last 72 hours. Thyroid Function Tests: No results for input(s): TSH, T4TOTAL, FREET4, T3FREE, THYROIDAB in the last 72 hours. Anemia Panel: No results for input(s): VITAMINB12, FOLATE, FERRITIN, TIBC, IRON, RETICCTPCT in the last 72 hours. Urine analysis:    Component Value Date/Time   COLORURINE YELLOW 03/19/2016 2139   APPEARANCEUR CLEAR 03/19/2016 2139   LABSPEC 1.014 03/19/2016 2139   PHURINE 8.0 03/19/2016 2139   GLUCOSEU 50 (A) 03/19/2016 2139   HGBUR MODERATE (A) 03/19/2016 2139   BILIRUBINUR NEGATIVE 03/19/2016 2139   KETONESUR NEGATIVE 03/19/2016 2139   PROTEINUR 30 (A) 03/19/2016 2139   UROBILINOGEN 0.2 01/07/2012 1140   NITRITE NEGATIVE 03/19/2016 2139   LEUKOCYTESUR NEGATIVE 03/19/2016 2139     Jyl Chico M.D. [facility] 03/25/2016, 12:08 PM  Pager: [phone] Between 7am to 7pm - call Pager - [phone]  After 7pm go to [email or web address] - password [email or web address]  Call night coverage person covering after 7pm

## 2016-03-26 LAB — CULTURE, BLOOD (ROUTINE X 2)
Culture: NO GROWTH
Culture: NO GROWTH

## 2016-03-26 LAB — CBC
HEMATOCRIT: 25.5 % — AB (ref 36.0–46.0)
Hemoglobin: 7.8 g/dL — ABNORMAL LOW (ref 12.0–15.0)
MCH: 26 pg (ref 26.0–34.0)
MCHC: 30.6 g/dL (ref 30.0–36.0)
MCV: 85 fL (ref 78.0–100.0)
PLATELETS: 191 10*3/uL (ref 150–400)
RBC: 3 MIL/uL — ABNORMAL LOW (ref 3.87–5.11)
RDW: 20.6 % — AB (ref 11.5–15.5)
WBC: 8.9 10*3/uL (ref 4.0–10.5)

## 2016-03-26 LAB — BASIC METABOLIC PANEL
ANION GAP: 10 (ref 5–15)
BUN: 7 mg/dL (ref 6–20)
CALCIUM: 8.1 mg/dL — AB (ref 8.9–10.3)
CO2: 23 mmol/L (ref 22–32)
Chloride: 102 mmol/L (ref 101–111)
Creatinine, Ser: 0.6 mg/dL (ref 0.44–1.00)
Glucose, Bld: 116 mg/dL — ABNORMAL HIGH (ref 65–99)
Potassium: 3.2 mmol/L — ABNORMAL LOW (ref 3.5–5.1)
SODIUM: 135 mmol/L (ref 135–145)

## 2016-03-26 MED ORDER — LORAZEPAM 0.5 MG PO TABS
0.5000 mg | ORAL_TABLET | Freq: Three times a day (TID) | ORAL | Status: DC | PRN
  Administered 2016-03-26 – 2016-03-27 (×3): 0.5 mg via ORAL
  Filled 2016-03-26 (×3): qty 1

## 2016-03-26 MED ORDER — POTASSIUM CHLORIDE CRYS ER 20 MEQ PO TBCR
40.0000 meq | EXTENDED_RELEASE_TABLET | Freq: Once | ORAL | Status: AC
  Administered 2016-03-26: 40 meq via ORAL
  Filled 2016-03-26: qty 2

## 2016-03-26 NOTE — Progress Notes (Signed)
[facility]                                                                              Patient Demographics  Catherine Hernandez, is a 30 y.o. female, DOB - 31-Mar-1986, ZOX:[id number]  Admit date - 03/19/2016   Admitting Physician Hillary BowJared M Gardner, DO  Outpatient Primary MD for the patient is No PCP Per Patient  Outpatient specialists:   LOS - 6  days    Chief Complaint  Patient presents with  . Chest Pain  . 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 She has a history of bacteremia and endocarditis as well as pneumonia due to septic emboli and she was in the hospital in September 2017, however left AMA prior to finishing her IV antibiotics. She presented to the ER on 12/12 with shortness of breath, [facility] was asked for admission however patient left AMA due to not receiving narcotic pain medications. She had blood cultures at that time which turned positive for Serratia and she was called to come back.   Assessment & Plan     Sepsis with serratia bacteremia, multifocal pneumonia with cavitation / septic emboli - Infectious disease was consulted, patient now transitioned to oral ciprofloxacin,Per [name] For one month  - Patient with long-standing history of IV drug use, as well as noncompliance with medical treatment and left AMA several times in the past - will need follow-up with infectious disease  Bilateral Pulmonary embolism - venous Doppler Negative for deep and superficial vein thrombosis in both legs - Per case manager, patient showed that she has Medicaid, both xarelto and eliquis would be covered under Medicaid benefits. Eliquis started. -  Consult care management to establish patient as an outpatient with [facility]   Endocarditis - Most  recent TTE in August 2017 showed "2 large, very mobile vegetations are seen on the TV leaflets, the larger of which, prolapses through the tricuspid valve with atleast mild TR. 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 - In August 2017 she had blood cultures with Enterococcus faecalis as well as MSSA  - Patient was seen by cardiothoracic surgery on 12/20, per Dr Morton PetersVan Tright, patient would not survive heart valve surgery and should be treated with antibiotics and emphasis on comfort care. - Palliative medicine following.   Drug abuse - counseled, discussed that she will not get any narcotic medication in the hospital, place on CIWA for withdrawal and use tramadol for pain  Symptomatic anemia, anemia of chronic disease, iron deficiency anemia - Likely from ongoing smoldering sepsis, transfused with goal hemoglobin above 7, fecal occult is pending. Hemoglobin last week was 5.7 when she left AMA - Transfused on 12/22, hemoglobin stable at 7.8.  Severe Protein calorie malnutrition  - Albumin 1.7  Code Status: full  DVT Prophylaxis:  eliquis  Family Communication: Discussed in detail with the patient, all imaging results, lab results explained to the patient   Disposition Plan: Will address outpatient follow-ups in a.m. with case management. Patient is also asking when she can be discharged she is getting frustrated being in the hospital. Will check if ciprofloxacin can be obtained from [facility] for a month as I doubt patient's compliance.   Time Spent in minutes 25 minutes  Procedures:  CTA chest   Consultants:   ID CT surgery  Antimicrobials:   Ceftriaxone 12/18> 12/22  Ciprofloxacin    Medications  Scheduled Meds: . apixaban  10 mg Oral Q12H   Followed by  . [START ON 03/31/2016] apixaban  5 mg Oral Q12H  . ciprofloxacin  500 mg Oral BID  .  feeding supplement (ENSURE ENLIVE)  237 mL Oral BID BM  . ferrous gluconate  324 mg Oral TID WC  . folic acid  1 mg Oral Daily  . multivitamin with minerals  1 tablet Oral Daily  . thiamine  100 mg Oral Daily   Continuous Infusions: PRN Meds:.acetaminophen, LORazepam **OR** LORazepam, ondansetron (ZOFRAN) IV, traMADol   Antibiotics   Anti-infectives    Start     Dose/Rate Route Frequency Ordered Stop   03/23/16 0800  ciprofloxacin (CIPRO) tablet 500 mg     500 mg Oral 2 times daily 03/22/16 1332     03/20/16 0600  cefTRIAXone (ROCEPHIN) 2 g in dextrose 5 % 50 mL IVPB  Status:  Discontinued     2 g 100 mL/hr over 30 Minutes Intravenous Every 24 hours 03/20/16 0008 03/22/16 1332   03/19/16 2215  ceFEPIme (MAXIPIME) 2 g in dextrose 5 % 50 mL IVPB  Status:  Discontinued     2 g 100 mL/hr over 30 Minutes Intravenous Every 8 hours 03/19/16 2203 03/20/16 0008        Subjective:   Catherine Hernandez was seen and examined today. Denies any complaints this morning, feeling frustrated, wants to go home. Patient denies dizziness,  abdominal pain, N/V/D/C, new weakness, numbess, tingling. No acute events overnight.    Objective:   Vitals:   03/25/16 2007 03/25/16 2325 03/26/16 0422 03/26/16 0801  BP: 102/67 92/60 105/63 (!) 94/59  Pulse: (!) 115 (!) 117 (!) 116 (!) 108  Resp: (!) 23 20 (!) 30 (!) 29  Temp: 99.6 F (37.6 C) 99.4 F (37.4 C) 99.8 F (37.7 C) 99.3 F (37.4 C)  TempSrc: Oral Oral Oral Oral  SpO2: 99% 98% 97% 97%  Weight:      Height:        Intake/Output Summary (Last 24 hours) at 03/26/16 1048 Last data filed at 03/25/16 1700  Gross per 24 hour  Intake              480 ml  Output                0 ml  Net              480 ml     Wt Readings from Last 3 Encounters:  03/20/16 43.5 kg (95 lb 14.4 oz)  12/16/15 43.1 kg (95 lb)  12/02/15 43.1 kg (95 lb 0.3 oz)     Exam  General: Alert and oriented x 3, NAD, frail  HEENT:    Neck:   Cardiovascular: S1 S2  auscultated, 3/6 TR murmur  Respiratory: Clear to auscultation bilaterally, no wheezing, rales or rhonchi  Gastrointestinal: Soft, nontender, nondistended, +  bowel sounds  Ext: no cyanosis clubbing or edema  Neuro: no new deficits   Skin: No rashes  Psych: Normal affect and demeanor, alert and oriented x3    Data Reviewed:  I have personally reviewed following labs and imaging studies  Micro Results Recent Results (from the past 240 hour(s))  Blood Culture (routine x 2)     Status: Abnormal   Collection Time: 03/19/16  8:30 PM  Result Value Ref Range Status   Specimen Description BLOOD LEFT ARM  Final   Special Requests BOTTLES DRAWN AEROBIC AND ANAEROBIC 5CC EA  Final   Culture  Setup Time   Final    GRAM NEGATIVE RODS IN BOTH AEROBIC AND ANAEROBIC BOTTLES CRITICAL RESULT CALLED TO, READ BACK BY AND VERIFIED WITH: N BATCHELDER,PHARMD AT 1242 03/20/16 BY L BENFIELD    Culture (A)  Final    SERRATIA MARCESCENS SUSCEPTIBILITIES PERFORMED ON PREVIOUS CULTURE WITHIN THE LAST 5 DAYS.    Report Status 03/23/2016 FINAL  Final  Urine culture     Status: Abnormal   Collection Time: 03/19/16  9:39 PM  Result Value Ref Range Status   Specimen Description URINE, RANDOM  Final   Special Requests NONE  Final   Culture MULTIPLE SPECIES PRESENT, SUGGEST RECOLLECTION (A)  Final   Report Status 03/21/2016 FINAL  Final  Blood Culture (routine x 2)     Status: Abnormal   Collection Time: 03/19/16 11:00 PM  Result Value Ref Range Status   Specimen Description BLOOD RIGHT ARM  Final   Special Requests IN PEDIATRIC BOTTLE 3CC  Final   Culture  Setup Time   Final    GRAM NEGATIVE RODS IN PEDIATRIC BOTTLE CRITICAL RESULT CALLED TO, READ BACK BY AND VERIFIED WITH: N BATCHELDER,PHARMD AT 1242 03/20/16 BY L BENFIELD    Culture SERRATIA MARCESCENS (A)  Final   Report Status 03/23/2016 FINAL  Final   Organism ID, Bacteria SERRATIA MARCESCENS  Final      Susceptibility   Serratia marcescens -  MIC*    CEFAZOLIN >=64 RESISTANT Resistant     CEFEPIME <=1 SENSITIVE Sensitive     CEFTAZIDIME <=1 SENSITIVE Sensitive     CEFTRIAXONE <=1 SENSITIVE Sensitive     CIPROFLOXACIN <=0.25 SENSITIVE Sensitive     GENTAMICIN <=1 SENSITIVE Sensitive     TRIMETH/SULFA <=20 SENSITIVE Sensitive     * SERRATIA MARCESCENS  Blood Culture ID Panel (Reflexed)     Status: Abnormal   Collection Time: 03/19/16 11:00 PM  Result Value Ref Range Status   Enterococcus species NOT DETECTED NOT DETECTED Final   Listeria monocytogenes NOT DETECTED NOT DETECTED Final   Staphylococcus species DETECTED (A) NOT DETECTED Final    Comment: CRITICAL RESULT CALLED TO, READ BACK BY AND VERIFIED WITH: N BATCHELDER,PHARMD AT 1242 03/20/16 BY L BENFIELD    Staphylococcus aureus NOT DETECTED NOT DETECTED Final   Methicillin resistance DETECTED (A) NOT DETECTED Final    Comment: CRITICAL RESULT CALLED TO, READ BACK BY AND VERIFIED WITH: N BATCHELDER,PHARMD AT 1242 03/20/16 BY L BENFIELD    Streptococcus species NOT DETECTED NOT DETECTED Final   Streptococcus agalactiae NOT DETECTED NOT DETECTED Final   Streptococcus pneumoniae NOT DETECTED NOT DETECTED Final   Streptococcus pyogenes NOT DETECTED NOT DETECTED Final   Acinetobacter baumannii NOT DETECTED NOT DETECTED Final   Enterobacteriaceae species DETECTED (A) NOT DETECTED Final    Comment: CRITICAL RESULT CALLED TO, READ BACK BY AND VERIFIED WITH: N BATCHELDER,PHARMD AT 1242 03/20/16 BY  L BENFIELD    Enterobacter cloacae complex NOT DETECTED NOT DETECTED Final   Escherichia coli NOT DETECTED NOT DETECTED Final   Klebsiella oxytoca NOT DETECTED NOT DETECTED Final   Klebsiella pneumoniae NOT DETECTED NOT DETECTED Final   Proteus species NOT DETECTED NOT DETECTED Final   Serratia marcescens DETECTED (A) NOT DETECTED Final    Comment: CRITICAL RESULT CALLED TO, READ BACK BY AND VERIFIED WITH: N BATCHELDER,PHARMD AT 1242 03/20/16 BY L BENFIELD    Carbapenem  resistance NOT DETECTED NOT DETECTED Final   Haemophilus influenzae NOT DETECTED NOT DETECTED Final   Neisseria meningitidis NOT DETECTED NOT DETECTED Final   Pseudomonas aeruginosa NOT DETECTED NOT DETECTED Final   Candida albicans NOT DETECTED NOT DETECTED Final   Candida glabrata NOT DETECTED NOT DETECTED Final   Candida krusei NOT DETECTED NOT DETECTED Final   Candida parapsilosis NOT DETECTED NOT DETECTED Final   Candida tropicalis NOT DETECTED NOT DETECTED Final  MRSA PCR Screening     Status: Abnormal   Collection Time: 03/20/16  3:17 AM  Result Value Ref Range Status   MRSA by PCR POSITIVE (A) NEGATIVE Final    Comment:        The GeneXpert MRSA Assay (FDA approved for NASAL specimens only), is one component of a comprehensive MRSA colonization surveillance program. It is not intended to diagnose MRSA infection nor to guide or monitor treatment for MRSA infections. RESULT CALLED TO, READ BACK BY AND VERIFIED WITH: C. SCHILLER 0901 12.18.2017 N. MORRIS   Culture, blood (single)     Status: Abnormal   Collection Time: 03/20/16 11:33 PM  Result Value Ref Range Status   Specimen Description BLOOD RIGHT ARM  Final   Special Requests IN PEDIATRIC BOTTLE 2CC  Final   Culture  Setup Time   Final    GRAM NEGATIVE RODS IN PEDIATRIC BOTTLE CRITICAL VALUE NOTED.  VALUE IS CONSISTENT WITH PREVIOUSLY REPORTED AND CALLED VALUE.    Culture (A)  Final    SERRATIA MARCESCENS SUSCEPTIBILITIES PERFORMED ON PREVIOUS CULTURE WITHIN THE LAST 5 DAYS.    Report Status 03/24/2016 FINAL  Final  Culture, blood (Routine X 2) w Reflex to ID Panel     Status: None   Collection Time: 03/21/16  5:14 AM  Result Value Ref Range Status   Specimen Description BLOOD LEFT ARM  Final   Special Requests BOTTLES DRAWN AEROBIC AND ANAEROBIC  Final   Culture NO GROWTH 5 DAYS  Final   Report Status 03/26/2016 FINAL  Final  Culture, blood (Routine X 2) w Reflex to ID Panel     Status: None    Collection Time: 03/21/16  5:35 AM  Result Value Ref Range Status   Specimen Description BLOOD RIGHT ARM  Final   Special Requests AEROBIC BOTTLE ONLY  Final   Culture NO GROWTH 5 DAYS  Final   Report Status 03/26/2016 FINAL  Final    Radiology Reports Dg Chest 2 View  Result Date: 03/15/2016 CLINICAL DATA:  Pneumonia, shortness of breath. EXAM: CHEST  2 VIEW COMPARISON:  Chest x-ray 03/14/2016. FINDINGS: Bilateral multifocal cavitary areas are again noted and unchanged since prior study. Small left pleural effusion. Heart is normal size. No acute bony abnormality. IMPRESSION: Stable multifocal cavitary pulmonary airspace opacities, most likely multifocal cavitary pneumonia. Septic emboli and malignancy are possible and not excluded. Electronically Signed   By: Charlett Nose M.D.   On: 03/15/2016 10:31   Dg Chest 2 View  Result Date:  03/14/2016 CLINICAL DATA:  Chest pain. EXAM: CHEST  2 VIEW COMPARISON:  12/16/2015. FINDINGS: Mediastinum hilar structures stable. Multifocal bilateral cavitary pulmonary lesions are noted. Multifocal cavitary pneumonia could present this fashion. Of malignancy would be less likely. Small left pleural effusion. No pneumothorax . Heart size normal. IMPRESSION: Multifocal cavitary pulmonary infiltrates most consistent with multifocal cavitary pneumonia. Chest CT may prove useful for further evaluation. These results will be called to the ordering clinician or representative by the Radiologist Assistant, and communication documented in the PACS or zVision Dashboard. Electronically Signed   By: Maisie Fushomas  Register   On: 03/14/2016 10:07   Ct Angio Chest Pe W Or Wo Contrast  Result Date: 03/22/2016 CLINICAL DATA:  Shortness of breath, endocarditis. Bacteremia. History of IV drug abuse, sepsis, severe tricuspid regurgitation vegetation. EXAM: CT ANGIOGRAPHY CHEST WITH CONTRAST TECHNIQUE: Multidetector CT imaging of the chest was performed using the standard protocol  during bolus administration of intravenous contrast. Multiplanar CT image reconstructions and MIPs were obtained to evaluate the vascular anatomy. CONTRAST:  56 cc Isovue 370 COMPARISON:  Chest radiograph March 19, 2016 and CT chest November 17, 2015. FINDINGS: CARDIOVASCULAR: Adequate contrast opacification of the pulmonary artery's. Main pulmonary artery is not enlarged. Bilateral lower lobe segmental to subsegmental occlusive pulmonary emboli with tapered appearance,. Mild cardiomegaly, dilated RIGHT atrium. No pericardial effusions. MEDIASTINUM/NODES: RIGHT hilar and subcarinal lymphadenopathy. LUNGS/PLEURA: Tracheobronchial tree is patent, no pneumothorax. Scattered small consolidations throughout the lungs bilaterally in addition to larger cavitating consolidations, some of which are new. Patchy ground-glass opacities. Trace pleural effusions. UPPER ABDOMEN: Included view of the abdomen is nonacute. Probable splenomegaly. MUSCULOSKELETAL: Patient is cachectic. 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 No acute osseous process. Review of the MIP images confirms the above findings. IMPRESSION: Bilateral lower lobe pulmonary emboli with tapered appearance, this can be seen with chronic pulmonary emboli or possibly vasculopathy secondary to septic emboli. Multiple cavitating and non cavitating pneumonia/consolidation of varying ages consistent with septic emboli. Trace pleural effusions. Mild cardiomegaly and dilated RIGHT atrium. Suspected splenomegaly. Electronically Signed   By: Awilda Metroourtnay  Bloomer M.D.   On: 03/22/2016 17:10   Dg Chest Port 1 View  Result Date: 03/20/2016 CLINICAL DATA:  Chest pain, shortness of breath, cough for 2 weeks. History of pneumonia and blood infection. EXAM: PORTABLE CHEST 1 VIEW COMPARISON:  Chest radiograph March 15, 2016. FINDINGS: Cardiomediastinal silhouette is normal. Central lucencies within LEFT midlung zone airspace opacity. Patchy additional airspace opacities with cavitation.  Trace LEFT pleural effusion. No pneumothorax. Soft tissue planes and included osseous structures are nonsuspicious. IMPRESSION: Increasing cavitation of multifocal pneumonia. Trace LEFT pleural effusion. Electronically Signed   By: Awilda Metroourtnay  Bloomer M.D.   On: 03/20/2016 00:25    Lab Data:  CBC:  Recent Labs Lab 03/19/16 2030  03/21/16 0513 03/22/16 0456 03/23/16 0602 03/24/16 0724 03/26/16 0406  WBC 14.4*  < > 14.8* 11.2* 12.5* 10.5 8.9  NEUTROABS 12.0*  --   --   --   --   --   --   HGB 4.4*  < > 7.8* 7.8* 8.4* 7.1* 7.8*  HCT 15.5*  < > 24.7* 24.5* 27.2* 23.3* 25.5*  MCV 75.2*  < > 79.9 83.6 82.9 84.1 85.0  PLT 319  < > 254 198 167 157 191  < > = values in this interval not displayed. Basic Metabolic Panel:  Recent Labs Lab 03/21/16 0513 03/22/16 0456 03/23/16 0602 03/24/16 0724 03/26/16 0406  NA 134* 135 137 135 135  K 3.5 3.4* 6.0* 3.6  3.2*  CL 105 103 107 101 102  CO2 19* 27 24 28 23   GLUCOSE 106* 117* 81 95 116*  BUN 5* 7 13 9 7   CREATININE 0.54 0.55 0.60 0.67 0.60  CALCIUM 7.6* 7.8* 7.7* 7.7* 8.1*   GFR: Estimated Creatinine Clearance: 71.9 mL/min (by C-G formula based on SCr of 0.6 mg/dL). Liver Function Tests:  Recent Labs Lab 03/19/16 2030 03/22/16 0456 03/23/16 0602 03/24/16 0724  AST 28 28 42* 23  ALT 23 22 23 22   ALKPHOS 112 98 99 89  BILITOT 0.2* 0.6 1.1 0.3  PROT 6.9 6.6 7.7 6.3*  ALBUMIN 1.9* 1.7* 1.8* 1.7*   No results for input(s): LIPASE, AMYLASE in the last 168 hours. No results for input(s): AMMONIA in the last 168 hours. Coagulation Profile: No results for input(s): INR, PROTIME in the last 168 hours. Cardiac Enzymes: No results for input(s): CKTOTAL, CKMB, CKMBINDEX, TROPONINI in the last 168 hours. BNP (last 3 results) No results for input(s): PROBNP in the last 8760 hours. HbA1C: No results for input(s): HGBA1C in the last 72 hours. CBG:  Recent Labs Lab 03/20/16 0906 03/20/16 1201  GLUCAP 117* 117*   Lipid  Profile: No results for input(s): CHOL, HDL, LDLCALC, TRIG, CHOLHDL, LDLDIRECT in the last 72 hours. Thyroid Function Tests: No results for input(s): TSH, T4TOTAL, FREET4, T3FREE, THYROIDAB in the last 72 hours. Anemia Panel: No results for input(s): VITAMINB12, FOLATE, FERRITIN, TIBC, IRON, RETICCTPCT in the last 72 hours. Urine analysis:    Component Value Date/Time   COLORURINE YELLOW 03/19/2016 2139   APPEARANCEUR CLEAR 03/19/2016 2139   LABSPEC 1.014 03/19/2016 2139   PHURINE 8.0 03/19/2016 2139   GLUCOSEU 50 (A) 03/19/2016 2139   HGBUR MODERATE (A) 03/19/2016 2139   BILIRUBINUR NEGATIVE 03/19/2016 2139   KETONESUR NEGATIVE 03/19/2016 2139   PROTEINUR 30 (A) 03/19/2016 2139   UROBILINOGEN 0.2 01/07/2012 1140   NITRITE NEGATIVE 03/19/2016 2139   LEUKOCYTESUR NEGATIVE 03/19/2016 2139     RAI,RIPUDEEP M.D. [facility] 03/26/2016, 10:48 AM  Pager: [phone] Between 7am to 7pm - call Pager - [phone]  After 7pm go to [email or web address] - password [email or web address]  Call night coverage person covering after 7pm

## 2016-03-27 DIAGNOSIS — F1110 Opioid abuse, uncomplicated: Secondary | ICD-10-CM

## 2016-03-27 MED ORDER — LORAZEPAM 1 MG PO TABS
1.0000 mg | ORAL_TABLET | Freq: Three times a day (TID) | ORAL | Status: DC | PRN
  Administered 2016-03-27 – 2016-03-28 (×3): 1 mg via ORAL
  Filled 2016-03-27 (×3): qty 1

## 2016-03-27 NOTE — Progress Notes (Signed)
[facility]                                                                              Patient Demographics  Catherine Hernandez, is a 30 y.o. female, DOB - 1985/10/21, ZOX:[id number]  Admit date - 03/19/2016   Admitting Physician Hillary BowJared M Gardner, DO  Outpatient Primary MD for the patient is No PCP Per Patient  Outpatient specialists:   LOS - 7  days    Chief Complaint  Patient presents with  . Chest Pain  . 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 She has a history of bacteremia and endocarditis as well as pneumonia due to septic emboli and she was in the hospital in September 2017, however left AMA prior to finishing her IV antibiotics. She presented to the ER on 12/12 with shortness of breath, [facility] was asked for admission however patient left AMA due to not receiving narcotic pain medications. She had blood cultures at that time which turned positive for Serratia and she was called to come back.   Assessment & Plan     Sepsis with serratia bacteremia, multifocal pneumonia with cavitation / septic emboli - Infectious disease was consulted, patient now transitioned to oral ciprofloxacin,Per [name] For one month  - Patient with long-standing history of IV drug use, as well as noncompliance with medical treatment and left AMA several times in the past - will need follow-up with infectious disease  Bilateral Pulmonary embolism - venous Doppler Negative for deep and superficial vein thrombosis in both legs - Per case manager, patient showed that she has Medicaid, both xarelto and eliquis would be covered under Medicaid benefits. Eliquis started. -  Consult care management to establish patient as an outpatient with [facility]   Endocarditis - Most  recent TTE in August 2017 showed "2 large, very mobile vegetations are seen on the TV leaflets, the larger of which, prolapses through the tricuspid valve with atleast mild TR. 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 - In August 2017 she had blood cultures with Enterococcus faecalis as well as MSSA  - Patient was seen by cardiothoracic surgery on 12/20, per Dr Morton PetersVan Tright, patient would not survive heart valve surgery and should be treated with antibiotics and emphasis on comfort care. - Palliative medicine following.   Drug abuse - counseled, discussed that she will not get any narcotic medication in the hospital, place on CIWA for withdrawal and use tramadol for pain  Symptomatic anemia, anemia of chronic disease, iron deficiency anemia - Likely from ongoing smoldering sepsis, transfused with goal hemoglobin above 7, fecal occult is pending. Hemoglobin last week was 5.7 when she left AMA - Transfused on 12/22, hemoglobin stable at 7.8.  Severe Protein calorie malnutrition  - Albumin 1.7  Code Status: full  DVT Prophylaxis:  eliquis  Family Communication: Discussed in detail with the patient, all imaging results, lab results explained to the patient   Disposition Plan: Will address outpatient follow-ups in a.m. with case management. Will check if ciprofloxacin can be obtained [facility] for a month as I doubt patient's compliance. Transfer to telemetry floor today, hopefully DC home in a.m.  Time Spent in minutes 25 minutes  Procedures:  CTA chest   Consultants:   ID CT surgery  Antimicrobials:   Ceftriaxone 12/18> 12/22  Ciprofloxacin    Medications  Scheduled Meds: . apixaban  10 mg Oral Q12H   Followed by  . [START ON 03/31/2016] apixaban  5 mg Oral Q12H  . ciprofloxacin  500 mg Oral BID  . feeding supplement (ENSURE ENLIVE)   237 mL Oral BID BM  . ferrous gluconate  324 mg Oral TID WC  . folic acid  1 mg Oral Daily  . multivitamin with minerals  1 tablet Oral Daily  . thiamine  100 mg Oral Daily   Continuous Infusions: PRN Meds:.acetaminophen, LORazepam, ondansetron (ZOFRAN) IV, traMADol   Antibiotics   Anti-infectives    Start     Dose/Rate Route Frequency Ordered Stop   03/23/16 0800  ciprofloxacin (CIPRO) tablet 500 mg     500 mg Oral 2 times daily 03/22/16 1332     03/20/16 0600  cefTRIAXone (ROCEPHIN) 2 g in dextrose 5 % 50 mL IVPB  Status:  Discontinued     2 g 100 mL/hr over 30 Minutes Intravenous Every 24 hours 03/20/16 0008 03/22/16 1332   03/19/16 2215  ceFEPIme (MAXIPIME) 2 g in dextrose 5 % 50 mL IVPB  Status:  Discontinued     2 g 100 mL/hr over 30 Minutes Intravenous Every 8 hours 03/19/16 2203 03/20/16 0008        Subjective:   Catherine Hernandez was seen and examined today. Feeling anxious, states that Ativan is not helping. Wants to go home soon. Patient denies dizziness,  abdominal pain, N/V/D/C, new weakness, numbess, tingling. No acute events overnight.    Objective:   Vitals:   03/26/16 1927 03/26/16 2333 03/27/16 0344 03/27/16 0800  BP: 98/71 106/75 (!) 94/57 96/67  Pulse: (!) 110 (!) 117 (!) 112 (!) 112  Resp: 20 14 (!) 21 20  Temp: 99.2 F (37.3 C) 99.8 F (37.7 C) 98.8 F (37.1 C) 99.3 F (37.4 C)  TempSrc: Oral Oral Oral Oral  SpO2: 97% 98% 100% 99%  Weight:      Height:        Intake/Output Summary (Last 24 hours) at 03/27/16 1059 Last data filed at 03/27/16 0344  Gross per 24 hour  Intake                0 ml  Output             1125 ml  Net            -1125 ml     Wt Readings from Last 3 Encounters:  03/20/16 43.5 kg (95 lb 14.4 oz)  12/16/15 43.1 kg (95 lb)  12/02/15 43.1 kg (95 lb 0.3 oz)     Exam  General: Alert and oriented x 3, NAD, frail  HEENT:    Neck:   Cardiovascular: S1 S2 auscultated, 3/6 TR murmur  Respiratory:  CTAB  Gastrointestinal: Soft, nontender, nondistended, + bowel sounds  Ext: no cyanosis clubbing or edema  Neuro: no new deficits   Skin: No rashes  Psych:  Normal affect and demeanor, alert and oriented x3    Data Reviewed:  I have personally reviewed following labs and imaging studies  Micro Results Recent Results (from the past 240 hour(s))  Blood Culture (routine x 2)     Status: Abnormal   Collection Time: 03/19/16  8:30 PM  Result Value Ref Range Status   Specimen Description BLOOD LEFT ARM  Final   Special Requests BOTTLES DRAWN AEROBIC AND ANAEROBIC 5CC EA  Final   Culture  Setup Time   Final    GRAM NEGATIVE RODS IN BOTH AEROBIC AND ANAEROBIC BOTTLES CRITICAL RESULT CALLED TO, READ BACK BY AND VERIFIED WITH: N BATCHELDER,PHARMD AT 1242 03/20/16 BY L BENFIELD    Culture (A)  Final    SERRATIA MARCESCENS SUSCEPTIBILITIES PERFORMED ON PREVIOUS CULTURE WITHIN THE LAST 5 DAYS.    Report Status 03/23/2016 FINAL  Final  Urine culture     Status: Abnormal   Collection Time: 03/19/16  9:39 PM  Result Value Ref Range Status   Specimen Description URINE, RANDOM  Final   Special Requests NONE  Final   Culture MULTIPLE SPECIES PRESENT, SUGGEST RECOLLECTION (A)  Final   Report Status 03/21/2016 FINAL  Final  Blood Culture (routine x 2)     Status: Abnormal   Collection Time: 03/19/16 11:00 PM  Result Value Ref Range Status   Specimen Description BLOOD RIGHT ARM  Final   Special Requests IN PEDIATRIC BOTTLE 3CC  Final   Culture  Setup Time   Final    GRAM NEGATIVE RODS IN PEDIATRIC BOTTLE CRITICAL RESULT CALLED TO, READ BACK BY AND VERIFIED WITH: N BATCHELDER,PHARMD AT 1242 03/20/16 BY L BENFIELD    Culture SERRATIA MARCESCENS (A)  Final   Report Status 03/23/2016 FINAL  Final   Organism ID, Bacteria SERRATIA MARCESCENS  Final      Susceptibility   Serratia marcescens - MIC*    CEFAZOLIN >=64 RESISTANT Resistant     CEFEPIME <=1 SENSITIVE Sensitive     CEFTAZIDIME <=1  SENSITIVE Sensitive     CEFTRIAXONE <=1 SENSITIVE Sensitive     CIPROFLOXACIN <=0.25 SENSITIVE Sensitive     GENTAMICIN <=1 SENSITIVE Sensitive     TRIMETH/SULFA <=20 SENSITIVE Sensitive     * SERRATIA MARCESCENS  Blood Culture ID Panel (Reflexed)     Status: Abnormal   Collection Time: 03/19/16 11:00 PM  Result Value Ref Range Status   Enterococcus species NOT DETECTED NOT DETECTED Final   Listeria monocytogenes NOT DETECTED NOT DETECTED Final   Staphylococcus species DETECTED (A) NOT DETECTED Final    Comment: CRITICAL RESULT CALLED TO, READ BACK BY AND VERIFIED WITH: N BATCHELDER,PHARMD AT 1242 03/20/16 BY L BENFIELD    Staphylococcus aureus NOT DETECTED NOT DETECTED Final   Methicillin resistance DETECTED (A) NOT DETECTED Final    Comment: CRITICAL RESULT CALLED TO, READ BACK BY AND VERIFIED WITH: N BATCHELDER,PHARMD AT 1242 03/20/16 BY L BENFIELD    Streptococcus species NOT DETECTED NOT DETECTED Final   Streptococcus agalactiae NOT DETECTED NOT DETECTED Final   Streptococcus pneumoniae NOT DETECTED NOT DETECTED Final   Streptococcus pyogenes NOT DETECTED NOT DETECTED Final   Acinetobacter baumannii NOT DETECTED NOT DETECTED Final   Enterobacteriaceae species DETECTED (A) NOT DETECTED Final    Comment: CRITICAL RESULT CALLED TO, READ BACK BY AND VERIFIED WITH: N BATCHELDER,PHARMD AT 1242 03/20/16 BY L BENFIELD    Enterobacter cloacae complex NOT DETECTED NOT DETECTED Final   Escherichia coli NOT DETECTED NOT DETECTED  Final   Klebsiella oxytoca NOT DETECTED NOT DETECTED Final   Klebsiella pneumoniae NOT DETECTED NOT DETECTED Final   Proteus species NOT DETECTED NOT DETECTED Final   Serratia marcescens DETECTED (A) NOT DETECTED Final    Comment: CRITICAL RESULT CALLED TO, READ BACK BY AND VERIFIED WITH: N BATCHELDER,PHARMD AT 1242 03/20/16 BY L BENFIELD    Carbapenem resistance NOT DETECTED NOT DETECTED Final   Haemophilus influenzae NOT DETECTED NOT DETECTED Final    Neisseria meningitidis NOT DETECTED NOT DETECTED Final   Pseudomonas aeruginosa NOT DETECTED NOT DETECTED Final   Candida albicans NOT DETECTED NOT DETECTED Final   Candida glabrata NOT DETECTED NOT DETECTED Final   Candida krusei NOT DETECTED NOT DETECTED Final   Candida parapsilosis NOT DETECTED NOT DETECTED Final   Candida tropicalis NOT DETECTED NOT DETECTED Final  MRSA PCR Screening     Status: Abnormal   Collection Time: 03/20/16  3:17 AM  Result Value Ref Range Status   MRSA by PCR POSITIVE (A) NEGATIVE Final    Comment:        The GeneXpert MRSA Assay (FDA approved for NASAL specimens only), is one component of a comprehensive MRSA colonization surveillance program. It is not intended to diagnose MRSA infection nor to guide or monitor treatment for MRSA infections. RESULT CALLED TO, READ BACK BY AND VERIFIED WITH: C. SCHILLER 0901 12.18.2017 N. MORRIS   Culture, blood (single)     Status: Abnormal   Collection Time: 03/20/16 11:33 PM  Result Value Ref Range Status   Specimen Description BLOOD RIGHT ARM  Final   Special Requests IN PEDIATRIC BOTTLE 2CC  Final   Culture  Setup Time   Final    GRAM NEGATIVE RODS IN PEDIATRIC BOTTLE CRITICAL VALUE NOTED.  VALUE IS CONSISTENT WITH PREVIOUSLY REPORTED AND CALLED VALUE.    Culture (A)  Final    SERRATIA MARCESCENS SUSCEPTIBILITIES PERFORMED ON PREVIOUS CULTURE WITHIN THE LAST 5 DAYS.    Report Status 03/24/2016 FINAL  Final  Culture, blood (Routine X 2) w Reflex to ID Panel     Status: None   Collection Time: 03/21/16  5:14 AM  Result Value Ref Range Status   Specimen Description BLOOD LEFT ARM  Final   Special Requests BOTTLES DRAWN AEROBIC AND ANAEROBIC  Final   Culture NO GROWTH 5 DAYS  Final   Report Status 03/26/2016 FINAL  Final  Culture, blood (Routine X 2) w Reflex to ID Panel     Status: None   Collection Time: 03/21/16  5:35 AM  Result Value Ref Range Status   Specimen Description BLOOD RIGHT ARM  Final    Special Requests AEROBIC BOTTLE ONLY  Final   Culture NO GROWTH 5 DAYS  Final   Report Status 03/26/2016 FINAL  Final    Radiology Reports Dg Chest 2 View  Result Date: 03/15/2016 CLINICAL DATA:  Pneumonia, shortness of breath. EXAM: CHEST  2 VIEW COMPARISON:  Chest x-ray 03/14/2016. FINDINGS: Bilateral multifocal cavitary areas are again noted and unchanged since prior study. Small left pleural effusion. Heart is normal size. No acute bony abnormality. IMPRESSION: Stable multifocal cavitary pulmonary airspace opacities, most likely multifocal cavitary pneumonia. Septic emboli and malignancy are possible and not excluded. Electronically Signed   By: Charlett Nose M.D.   On: 03/15/2016 10:31   Dg Chest 2 View  Result Date: 03/14/2016 CLINICAL DATA:  Chest pain. EXAM: CHEST  2 VIEW COMPARISON:  12/16/2015. FINDINGS: Mediastinum hilar structures stable. Multifocal bilateral  cavitary pulmonary lesions are noted. Multifocal cavitary pneumonia could present this fashion. Of malignancy would be less likely. Small left pleural effusion. No pneumothorax . Heart size normal. IMPRESSION: Multifocal cavitary pulmonary infiltrates most consistent with multifocal cavitary pneumonia. Chest CT may prove useful for further evaluation. These results will be called to the ordering clinician or representative by the Radiologist Assistant, and communication documented in the PACS or zVision Dashboard. Electronically Signed   By: Maisie Fushomas  Register   On: 03/14/2016 10:07   Ct Angio Chest Pe W Or Wo Contrast  Result Date: 03/22/2016 CLINICAL DATA:  Shortness of breath, endocarditis. Bacteremia. History of IV drug abuse, sepsis, severe tricuspid regurgitation vegetation. EXAM: CT ANGIOGRAPHY CHEST WITH CONTRAST TECHNIQUE: Multidetector CT imaging of the chest was performed using the standard protocol during bolus administration of intravenous contrast. Multiplanar CT image reconstructions and MIPs were obtained to  evaluate the vascular anatomy. CONTRAST:  56 cc Isovue 370 COMPARISON:  Chest radiograph March 19, 2016 and CT chest November 17, 2015. FINDINGS: CARDIOVASCULAR: Adequate contrast opacification of the pulmonary artery's. Main pulmonary artery is not enlarged. Bilateral lower lobe segmental to subsegmental occlusive pulmonary emboli with tapered appearance,. Mild cardiomegaly, dilated RIGHT atrium. No pericardial effusions. MEDIASTINUM/NODES: RIGHT hilar and subcarinal lymphadenopathy. LUNGS/PLEURA: Tracheobronchial tree is patent, no pneumothorax. Scattered small consolidations throughout the lungs bilaterally in addition to larger cavitating consolidations, some of which are new. Patchy ground-glass opacities. Trace pleural effusions. UPPER ABDOMEN: Included view of the abdomen is nonacute. Probable splenomegaly. MUSCULOSKELETAL: Patient is cachectic. 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 No acute osseous process. Review of the MIP images confirms the above findings. IMPRESSION: Bilateral lower lobe pulmonary emboli with tapered appearance, this can be seen with chronic pulmonary emboli or possibly vasculopathy secondary to septic emboli. Multiple cavitating and non cavitating pneumonia/consolidation of varying ages consistent with septic emboli. Trace pleural effusions. Mild cardiomegaly and dilated RIGHT atrium. Suspected splenomegaly. Electronically Signed   By: Awilda Metroourtnay  Bloomer M.D.   On: 03/22/2016 17:10   Dg Chest Port 1 View  Result Date: 03/20/2016 CLINICAL DATA:  Chest pain, shortness of breath, cough for 2 weeks. History of pneumonia and blood infection. EXAM: PORTABLE CHEST 1 VIEW COMPARISON:  Chest radiograph March 15, 2016. FINDINGS: Cardiomediastinal silhouette is normal. Central lucencies within LEFT midlung zone airspace opacity. Patchy additional airspace opacities with cavitation. Trace LEFT pleural effusion. No pneumothorax. Soft tissue planes and included osseous structures are nonsuspicious.  IMPRESSION: Increasing cavitation of multifocal pneumonia. Trace LEFT pleural effusion. Electronically Signed   By: Awilda Metroourtnay  Bloomer M.D.   On: 03/20/2016 00:25    Lab Data:  CBC:  Recent Labs Lab 03/21/16 0513 03/22/16 0456 03/23/16 0602 03/24/16 0724 03/26/16 0406  WBC 14.8* 11.2* 12.5* 10.5 8.9  HGB 7.8* 7.8* 8.4* 7.1* 7.8*  HCT 24.7* 24.5* 27.2* 23.3* 25.5*  MCV 79.9 83.6 82.9 84.1 85.0  PLT 254 198 167 157 191   Basic Metabolic Panel:  Recent Labs Lab 03/21/16 0513 03/22/16 0456 03/23/16 0602 03/24/16 0724 03/26/16 0406  NA 134* 135 137 135 135  K 3.5 3.4* 6.0* 3.6 3.2*  CL 105 103 107 101 102  CO2 19* 27 24 28 23   GLUCOSE 106* 117* 81 95 116*  BUN 5* 7 13 9 7   CREATININE 0.54 0.55 0.60 0.67 0.60  CALCIUM 7.6* 7.8* 7.7* 7.7* 8.1*   GFR: Estimated Creatinine Clearance: 71.9 mL/min (by C-G formula based on SCr of 0.6 mg/dL). Liver Function Tests:  Recent Labs Lab 03/22/16 0456 03/23/16 0602 03/24/16 [id number]  AST 28 42* 23  ALT 22 23 22   ALKPHOS 98 99 89  BILITOT 0.6 1.1 0.3  PROT 6.6 7.7 6.3*  ALBUMIN 1.7* 1.8* 1.7*   No results for input(s): LIPASE, AMYLASE in the last 168 hours. No results for input(s): AMMONIA in the last 168 hours. Coagulation Profile: No results for input(s): INR, PROTIME in the last 168 hours. Cardiac Enzymes: No results for input(s): CKTOTAL, CKMB, CKMBINDEX, TROPONINI in the last 168 hours. BNP (last 3 results) No results for input(s): PROBNP in the last 8760 hours. HbA1C: No results for input(s): HGBA1C in the last 72 hours. CBG:  Recent Labs Lab 03/20/16 1201  GLUCAP 117*   Lipid Profile: No results for input(s): CHOL, HDL, LDLCALC, TRIG, CHOLHDL, LDLDIRECT in the last 72 hours. Thyroid Function Tests: No results for input(s): TSH, T4TOTAL, FREET4, T3FREE, THYROIDAB in the last 72 hours. Anemia Panel: No results for input(s): VITAMINB12, FOLATE, FERRITIN, TIBC, IRON, RETICCTPCT in the last 72 hours. Urine  analysis:    Component Value Date/Time   COLORURINE YELLOW 03/19/2016 2139   APPEARANCEUR CLEAR 03/19/2016 2139   LABSPEC 1.014 03/19/2016 2139   PHURINE 8.0 03/19/2016 2139   GLUCOSEU 50 (A) 03/19/2016 2139   HGBUR MODERATE (A) 03/19/2016 2139   BILIRUBINUR NEGATIVE 03/19/2016 2139   KETONESUR NEGATIVE 03/19/2016 2139   PROTEINUR 30 (A) 03/19/2016 2139   UROBILINOGEN 0.2 01/07/2012 1140   NITRITE NEGATIVE 03/19/2016 2139   LEUKOCYTESUR NEGATIVE 03/19/2016 2139     Hartford Maulden M.D. [facility] 03/27/2016, 10:59 AM  Pager: [phone] Between 7am to 7pm - call Pager - [phone]  After 7pm go to [email or web address] - password [email or web address]  Call night coverage person covering after 7pm

## 2016-03-27 NOTE — Progress Notes (Signed)
Report called to 5w 

## 2016-03-27 NOTE — Progress Notes (Signed)
NURSING PROGRESS NOTE  Catherine Hernandez [id number] Transfer Data: 03/27/2016 6:30 PM Attending Provider: Cathren Harshipudeep K Rai, MD PCP:No PCP Per Patient Code Status: FULL   Catherine Hernandez is a 30 y.o. female patient [facility]  -No acute distress noted.  -No complaints of shortness of breath.  -No complaints of chest pain.   Cardiac Monitoring: Box # 08 in place. Cardiac monitor yields:normal sinus rhythm.  Last Documented Vital Signs: Blood pressure 100/64, pulse (!) 205, temperature 98.3 F (36.8 C), temperature source Oral, resp. rate (!) 24, height 5\' 5"  (1.651 m), weight 43.5 kg (95 lb 14.4 oz), SpO2 100 %.  IV Fluids:  IV in place, occlusive dsg intact without redness, IV cath upper arm right, condition patent and no redness IV push only, no IV fluids.   Allergies:  Patient has no known allergies.  Past Medical History:   has a past medical history of Anemia; CAP (community acquired pneumonia) (11/16/2015); Childhood asthma; Enterococcal bacteremia; Heroin abuse; Hyponatremia; Pneumonia (2007); and Polysubstance abuse.  Past Surgical History:   has a past surgical history that includes No past surgeries and TEE without cardioversion (N/A, 11/23/2015).  Social History:   reports that she has been smoking Cigarettes.  She has a 5.00 pack-year smoking history. She has never used smokeless tobacco. She reports that she uses drugs, including IV, Cocaine, Heroin, and Oxycodone. She reports that she does not drink alcohol.  Skin: intact except where otherwise charted  Patient/Family orientated to room. Information packet given to patient/family. Admission inpatient armband information verified with patient/family to include name and date of birth and placed on patient arm. Side rails up x 2, fall assessment and education completed with patient/family. Patient/family able to verbalize understanding of risk associated with falls and verbalized understanding to call for assistance  before getting out of bed. Call light within reach. Patient/family able to voice and demonstrate understanding of unit orientation instructions.

## 2016-03-28 DIAGNOSIS — R079 Chest pain, unspecified: Secondary | ICD-10-CM

## 2016-03-28 DIAGNOSIS — A419 Sepsis, unspecified organism: Secondary | ICD-10-CM

## 2016-03-28 DIAGNOSIS — J189 Pneumonia, unspecified organism: Secondary | ICD-10-CM

## 2016-03-28 DIAGNOSIS — D649 Anemia, unspecified: Secondary | ICD-10-CM

## 2016-03-28 DIAGNOSIS — I2690 Septic pulmonary embolism without acute cor pulmonale: Secondary | ICD-10-CM

## 2016-03-28 LAB — BASIC METABOLIC PANEL
ANION GAP: 12 (ref 5–15)
BUN: 9 mg/dL (ref 6–20)
CO2: 22 mmol/L (ref 22–32)
Calcium: 8.5 mg/dL — ABNORMAL LOW (ref 8.9–10.3)
Chloride: 100 mmol/L — ABNORMAL LOW (ref 101–111)
Creatinine, Ser: 0.69 mg/dL (ref 0.44–1.00)
GFR calc Af Amer: >60 mL/min (ref >60)
GLUCOSE: 130 mg/dL — AB (ref 65–99)
POTASSIUM: 4.1 mmol/L (ref 3.5–5.1)
SODIUM: 134 mmol/L — AB (ref 135–145)

## 2016-03-28 LAB — CBC
HCT: 30.9 % — ABNORMAL LOW (ref 36.0–46.0)
HEMOGLOBIN: 9.2 g/dL — AB (ref 12.0–15.0)
MCH: 24.9 pg — ABNORMAL LOW (ref 26.0–34.0)
MCHC: 29.8 g/dL — ABNORMAL LOW (ref 30.0–36.0)
MCV: 83.7 fL (ref 78.0–100.0)
PLATELETS: 242 10*3/uL (ref 150–400)
RBC: 3.69 MIL/uL — AB (ref 3.87–5.11)
RDW: 20.5 % — ABNORMAL HIGH (ref 11.5–15.5)
WBC: 12.4 10*3/uL — AB (ref 4.0–10.5)

## 2016-03-28 MED ORDER — LORAZEPAM 1 MG PO TABS: 1.0000 mg | ORAL_TABLET | Freq: Three times a day (TID) | ORAL | 0 refills | Status: AC | PRN

## 2016-03-28 MED ORDER — TRAMADOL HCL 50 MG PO TABS: 50.0000 mg | ORAL_TABLET | Freq: Four times a day (QID) | ORAL | 0 refills | Status: DC | PRN

## 2016-03-28 MED ORDER — APIXABAN 5 MG PO TABS: 10.0000 mg | ORAL_TABLET | Freq: Two times a day (BID) | ORAL | 0 refills | Status: DC

## 2016-03-28 MED ORDER — CIPROFLOXACIN HCL 500 MG PO TABS: 500.0000 mg | ORAL_TABLET | Freq: Two times a day (BID) | ORAL | 0 refills | Status: DC

## 2016-03-28 MED ORDER — FERROUS GLUCONATE 324 (38 FE) MG PO TABS: 324.0000 mg | ORAL_TABLET | Freq: Three times a day (TID) | ORAL | 3 refills | Status: AC

## 2016-03-28 MED ORDER — ADULT MULTIVITAMIN W/MINERALS CH: 1.0000 | ORAL_TABLET | Freq: Every day | ORAL | 3 refills | Status: AC

## 2016-03-28 MED ORDER — APIXABAN 5 MG PO TABS: 5.0000 mg | ORAL_TABLET | Freq: Two times a day (BID) | ORAL | 5 refills | Status: AC

## 2016-03-28 NOTE — Progress Notes (Signed)
Heart rate is sustaining in the 120's. Pt is a little anxious. Pt has received prn ativan. Paged Dr Isidoro Donningai.

## 2016-03-28 NOTE — Progress Notes (Signed)
NURSING PROGRESS NOTE  Catherine Hernandez [id number] Discharge Data: 03/28/2016 2:23 PM Attending Provider: Cathren Harshipudeep K Rai, MD PCP:No PCP Per Patient     Catherine Hernandez to be D/C'd Home per MD order.  Discussed with the patient the After Visit Summary and all questions fully answered. All IV's discontinued with no bleeding noted. All belongings returned to patient for patient to take home.   Last Vital Signs:  Blood pressure 103/72, pulse (!) 103, temperature 98.6 F (37 C), temperature source Oral, resp. rate 17, height 5\' 5"  (1.651 m), weight 43.5 kg (95 lb 14.4 oz), SpO2 100 %.  Discharge Medication List Allergies as of 03/28/2016   No Known Allergies     Medication List    STOP taking these medications   amoxicillin-clavulanate 875-125 MG tablet Commonly known as:  AUGMENTIN     TAKE these medications   acetaminophen 325 MG tablet Commonly known as:  TYLENOL Take 325-650 mg by mouth every 6 (six) hours as needed (for chest pain).   apixaban 5 MG Tabs tablet Commonly known as:  ELIQUIS Take 2 tablets (10 mg total) by mouth 2 (two) times daily. Till 03/30/16   apixaban 5 MG Tabs tablet Commonly known as:  ELIQUIS Take 1 tablet (5 mg total) by mouth every 12 (twelve) hours. Start on 03/31/16 Start taking on:  03/31/2016   ciprofloxacin 500 MG tablet Commonly known as:  CIPRO Take 1 tablet (500 mg total) by mouth 2 (two) times daily.   feeding supplement Liqd Take 1 Container by mouth 2 (two) times daily between meals.   ferrous gluconate 324 MG tablet Commonly known as:  FERGON Take 1 tablet (324 mg total) by mouth 3 (three) times daily with meals.   LORazepam 1 MG tablet Commonly known as:  ATIVAN Take 1 tablet (1 mg total) by mouth every 8 (eight) hours as needed for anxiety.   multivitamin with minerals Tabs tablet Take 1 tablet by mouth daily. Start taking on:  03/29/2016   traMADol 50 MG tablet Commonly known as:  ULTRAM Take 1 tablet (50 mg total) by  mouth every 6 (six) hours as needed for severe pain.        Bennie Pieriniyndi Vyom Brass, RN

## 2016-03-28 NOTE — Discharge Summary (Signed)
Physician Discharge Summary   Patient ID: Catherine Hernandez MRN: [id number] DOB/AGE: 1985-08-05 30 y.o.  Admit date: 03/19/2016 Discharge date: 03/28/2016  Primary Care Physician:  No PCP Per Patient  Discharge Diagnoses:    . Endocarditis   Bilateral pulmonary embolism . Opioid use disorder, severe, dependence (HCC) . Serratia sepsis (HCC) . Heroin abuse . Multifocal pneumonia with cavitation and septic emboli . Symptomatic anemia   Severe Medical and medication noncompliance   Consults:  Infectious disease Cardiothoracic surgery   Palliative medicine   Recommendations for Outpatient Follow-up:  1. Please repeat CBC/BMET at next visit   DIET: Regular   Allergies:  No Known Allergies   DISCHARGE MEDICATIONS: Current Discharge Medication List    START taking these medications   Details  !! apixaban (ELIQUIS) 5 MG TABS tablet Take 2 tablets (10 mg total) by mouth 2 (two) times daily. Till 03/30/16 Qty: 6 tablet, Refills: 0    !! apixaban (ELIQUIS) 5 MG TABS tablet Take 1 tablet (5 mg total) by mouth every 12 (twelve) hours. Start on 03/31/16 Qty: 60 tablet, Refills: 5    ciprofloxacin (CIPRO) 500 MG tablet Take 1 tablet (500 mg total) by mouth 2 (two) times daily. Qty: 60 tablet, Refills: 0    ferrous gluconate (FERGON) 324 MG tablet Take 1 tablet (324 mg total) by mouth 3 (three) times daily with meals. Qty: 90 tablet, Refills: 3    LORazepam (ATIVAN) 1 MG tablet Take 1 tablet (1 mg total) by mouth every 8 (eight) hours as needed for anxiety. Qty: 30 tablet, Refills: 0    Multiple Vitamin (MULTIVITAMIN WITH MINERALS) TABS tablet Take 1 tablet by mouth daily. Qty: 30 tablet, Refills: 3    traMADol (ULTRAM) 50 MG tablet Take 1 tablet (50 mg total) by mouth every 6 (six) hours as needed for severe pain. Qty: 30 tablet, Refills: 0     !! - Potential duplicate medications found. Please discuss with provider.    CONTINUE these medications which have NOT  CHANGED   Details  acetaminophen (TYLENOL) 325 MG tablet Take 325-650 mg by mouth every 6 (six) hours as needed (for chest pain).    feeding supplement (BOOST / RESOURCE BREEZE) LIQD Take 1 Container by mouth 2 (two) times daily between meals. Refills: 0      STOP taking these medications     amoxicillin-clavulanate (AUGMENTIN) 875-125 MG tablet          Brief H and P: For complete details please refer to admission H and P, but in brief 30 y.o.femalewith medical history significant of IVDU that is ongoing with heroin, the emergency room on 12/18 after being called to have positive blood cultures. She has a history of bacteremia and endocarditis as well as pneumonia due to septic emboli and she was in the hospital in September 2017, however left AMA prior to finishing her IV antibiotics. She presented to the ER on 12/12 with shortness of breath, [facility] was asked for admission however patient left AMA due to not receiving narcotic pain medications. She had blood cultures at that time which turned positive for Serratia and she was called to come back.  Hospital Course:   Sepsiswith serratia bacteremia, multifocal pneumonia with cavitation / septic emboli - Infectious disease was consulted, patient now transitioned to oral ciprofloxacin ,Per [name] For one month  - Patient with long-standing history of IV drug use, as well as noncompliance with medical treatment and left AMA several times in the past.  Patient was placed on IV Rocephin during hospitalization, not a candidate for home PICC line. 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 CT surgery was consulted. Patient was seen by [name] who felt that patient would not survive heartfelt surgery and should be treated with antibiotics with emphasis on comfort care. She was transitioned to oral ciprofloxacin, strongly  recommended to follow up with infectious disease in office.  Case management confirmed that she has Medicaid. Patient will be able to get ciprofloxacin for 4 dollars.   Bilateral Pulmonary embolism - venous Doppler Negative for deep and superficial vein thrombosis in both legs - Per case manager, patient showed that she has Medicaid, both xarelto and eliquis would be covered under Medicaid benefits. Eliquis started. This was verified again prior to discharge by the case management. She was also given 30 day free card for Eliquis. - I strongly recommended the patient to be compliant with eliquis due to pulmonary embolism.   Endocarditis - Most recent TTE in August 2017 showed "2 large, very mobile vegetations are seen on the TV leaflets, the larger of which, prolapses through the tricuspid valve with atleast mild TR. 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 - In August 2017 she had blood cultures with Enterococcus faecalis as well as MSSA  - Patient was seen by cardiothoracic surgery on 12/20, per Dr Morton PetersVan Tright, patient would not survive heart valve surgery and should be treated with antibiotics and emphasis on comfort care. Patient also has recurrent bacteremia with Serratia, bilateral pneumonia with septic emboli, cavitation and vegetation of the tricuspid valve is severe TR. She is also severely protein malnourished with albumin of 1.6. - Palliative medicine was consulted and followed the patient closely.  Drug abuse - counseled strongly. Patient was also followed closely by palliative medicine  Symptomatic anemia, anemia of chronic disease, iron deficiency anemia -Hemoglobin currently stable, 9.2 at the time of discharge.   Severe Protein calorie malnutrition  -Albumin 1.7  Day of Discharge BP 103/72 (BP Location: Left Arm)   Pulse (!) 103   Temp 98.6 F (37 C)  (Oral)   Resp 17   Ht 5\' 5"  (1.651 m)   Wt 43.5 kg (95 lb 14.4 oz)   SpO2 100%   BMI 15.96 kg/m   Physical Exam: General: Alert and awake oriented x3 not in any acute distress.Frail  HEENT: anicteric sclera, pupils reactive to light and accommodation CVS: S1-S2 clear 3/6 TR murmur  Chest: clear to auscultation bilaterally, no wheezing rales or rhonchi Abdomen: soft nontender, nondistended, normal bowel sounds Extremities: no cyanosis, clubbing or edema noted bilaterally Neuro: Cranial nerves II-XII intact, no focal neurological deficits   The results of significant diagnostics from this hospitalization (including imaging, microbiology, ancillary and laboratory) are listed below for reference.    LAB RESULTS: Basic Metabolic Panel:  Recent Labs Lab 03/26/16 0406 03/28/16 0612  NA 135 134*  K 3.2* 4.1  CL 102 100*  CO2 23 22  GLUCOSE 116* 130*  BUN 7 9  CREATININE 0.60 0.69  CALCIUM 8.1* 8.5*   Liver Function Tests:  Recent Labs Lab 03/23/16 0602 03/24/16 0724  AST 42* 23  ALT 23 22  ALKPHOS 99 89  BILITOT 1.1 0.3  PROT 7.7 6.3*  ALBUMIN 1.8* 1.7*   No results for  input(s): LIPASE, AMYLASE in the last 168 hours. No results for input(s): AMMONIA in the last 168 hours. CBC:  Recent Labs Lab 03/26/16 0406 03/28/16 0612  WBC 8.9 12.4*  HGB 7.8* 9.2*  HCT 25.5* 30.9*  MCV 85.0 83.7  PLT 191 242   Cardiac Enzymes: No results for input(s): CKTOTAL, CKMB, CKMBINDEX, TROPONINI in the last 168 hours. BNP: Invalid input(s): POCBNP CBG: No results for input(s): GLUCAP in the last 168 hours.  Significant Diagnostic Studies:  Dg Chest Port 1 View  Result Date: 03/20/2016 CLINICAL DATA:  Chest pain, shortness of breath, cough for 2 weeks. History of pneumonia and blood infection. EXAM: PORTABLE CHEST 1 VIEW COMPARISON:  Chest radiograph March 15, 2016. FINDINGS: Cardiomediastinal silhouette is normal. Central lucencies within LEFT midlung zone airspace  opacity. Patchy additional airspace opacities with cavitation. Trace LEFT pleural effusion. No pneumothorax. Soft tissue planes and included osseous structures are nonsuspicious. IMPRESSION: Increasing cavitation of multifocal pneumonia. Trace LEFT pleural effusion. Electronically Signed   By: Awilda Metro M.D.   On: 03/20/2016 00:25    2D ECHO:   Disposition and Follow-up: Discharge Instructions    Diet general    Complete by:  As directed    Increase activity slowly    Complete by:  As directed        DISPOSITION: Home  DISCHARGE FOLLOW-UP Follow-up Information    [facility]. Schedule an appointment as soon as possible for a visit in 2 week(s).   Contact information: [address] [place] [address] [phone]       Acey Lav, MD. Schedule an appointment as soon as possible for a visit in 2 week(s).   Specialty:  Infectious Diseases Contact information: [address] [place] [address] [phone]            Time spent on Discharge: 35 minutes  Signed:   RAI,RIPUDEEP M.D. [facility] 03/28/2016, 2:13 PM Pager: [phone]

## 2016-03-28 NOTE — Progress Notes (Signed)
HAR note 03/21/16 Medicaid has been e-verified.

## 2016-03-28 NOTE — Progress Notes (Signed)
CSW received consult to provided homeless resources. CSW spoke with patient while patient's boyfriend waited outside the room. Patient denied being homeless. Patient stated that she is staying with her boyfriend. He also has transportation for her when she discharges. CSW offered resources and patient accepted them.  CSW signing off.  Osborne Cascoadia Darreon Lutes LCSWA [phone]

## 2016-03-28 NOTE — Care Management Note (Signed)
Case Management Note  Patient Details  Name: Margretta Dittyngela N Gieger MRN: [id number] Date of Birth: 1985/04/10  Subjective/Objective:                 Spoke with patient and boyfriend at the bedside. Patient informed that her medicaid is active, as verified in HAR note 03/21/16. Patient and boyfriend at bedside instructed how to get new Medicaid card. Patient provided with 30 day Eliquis card, patiet verbalized understanding of how to use. CM stressed importance of getting replacement medicaid card so Eliquis can be refilled 30 days from now. [facility].   Action/Plan:  DC to home self care.   Expected Discharge Date:  03/22/16               Expected Discharge Plan:  Home/Self Care  In-House Referral:  Clinical Social Work  Discharge planning Services  CM Consult, Medication Assistance  Post Acute Care Choice:  NA Choice offered to:     DME Arranged:    DME Agency:     HH Arranged:    HH Agency:     Status of Service:  Completed, signed off  If discussed at [employer] Meetings, dates discussed:    Additional Comments:  Lawerance SabalDebbie Mirella Gueye, RN 03/28/2016, 1:47 PM

## 2016-04-02 ENCOUNTER — Inpatient Hospital Stay (HOSPITAL_COMMUNITY)
Admission: EM | Admit: 2016-04-02 | Discharge: 2016-04-09 | DRG: 871 | Disposition: A | Discharge status: Home / Self Care | Payer: Medicaid Other | Attending: Internal Medicine | Admitting: Internal Medicine

## 2016-04-02 DIAGNOSIS — I079 Rheumatic tricuspid valve disease, unspecified: Secondary | ICD-10-CM | POA: Diagnosis present

## 2016-04-02 DIAGNOSIS — Z720 Tobacco use: Secondary | ICD-10-CM | POA: Diagnosis present

## 2016-04-02 DIAGNOSIS — A498 Other bacterial infections of unspecified site: Secondary | ICD-10-CM | POA: Diagnosis not present

## 2016-04-02 DIAGNOSIS — I330 Acute and subacute infective endocarditis: Secondary | ICD-10-CM | POA: Diagnosis present

## 2016-04-02 DIAGNOSIS — D509 Iron deficiency anemia, unspecified: Secondary | ICD-10-CM | POA: Diagnosis present

## 2016-04-02 DIAGNOSIS — Z86711 Personal history of pulmonary embolism: Secondary | ICD-10-CM

## 2016-04-02 DIAGNOSIS — K089 Disorder of teeth and supporting structures, unspecified: Secondary | ICD-10-CM

## 2016-04-02 DIAGNOSIS — E43 Unspecified severe protein-calorie malnutrition: Secondary | ICD-10-CM | POA: Diagnosis present

## 2016-04-02 DIAGNOSIS — Z681 Body mass index (BMI) 19 or less, adult: Secondary | ICD-10-CM

## 2016-04-02 DIAGNOSIS — Z765 Malingerer [conscious simulation]: Secondary | ICD-10-CM

## 2016-04-02 DIAGNOSIS — Y95 Nosocomial condition: Secondary | ICD-10-CM | POA: Diagnosis present

## 2016-04-02 DIAGNOSIS — Z8701 Personal history of pneumonia (recurrent): Secondary | ICD-10-CM

## 2016-04-02 DIAGNOSIS — I272 Pulmonary hypertension, unspecified: Secondary | ICD-10-CM | POA: Diagnosis present

## 2016-04-02 DIAGNOSIS — D649 Anemia, unspecified: Secondary | ICD-10-CM | POA: Diagnosis present

## 2016-04-02 DIAGNOSIS — I368 Other nonrheumatic tricuspid valve disorders: Secondary | ICD-10-CM

## 2016-04-02 DIAGNOSIS — F13239 Sedative, hypnotic or anxiolytic dependence with withdrawal, unspecified: Secondary | ICD-10-CM | POA: Diagnosis present

## 2016-04-02 DIAGNOSIS — J189 Pneumonia, unspecified organism: Secondary | ICD-10-CM | POA: Diagnosis present

## 2016-04-02 DIAGNOSIS — I2690 Septic pulmonary embolism without acute cor pulmonale: Secondary | ICD-10-CM | POA: Diagnosis present

## 2016-04-02 DIAGNOSIS — Z7901 Long term (current) use of anticoagulants: Secondary | ICD-10-CM

## 2016-04-02 DIAGNOSIS — R7881 Bacteremia: Secondary | ICD-10-CM | POA: Diagnosis present

## 2016-04-02 DIAGNOSIS — F1410 Cocaine abuse, uncomplicated: Secondary | ICD-10-CM | POA: Diagnosis present

## 2016-04-02 DIAGNOSIS — R64 Cachexia: Secondary | ICD-10-CM | POA: Diagnosis present

## 2016-04-02 DIAGNOSIS — B952 Enterococcus as the cause of diseases classified elsewhere: Secondary | ICD-10-CM | POA: Diagnosis present

## 2016-04-02 DIAGNOSIS — F17210 Nicotine dependence, cigarettes, uncomplicated: Secondary | ICD-10-CM | POA: Diagnosis present

## 2016-04-02 DIAGNOSIS — F1910 Other psychoactive substance abuse, uncomplicated: Secondary | ICD-10-CM | POA: Diagnosis present

## 2016-04-02 DIAGNOSIS — B9561 Methicillin susceptible Staphylococcus aureus infection as the cause of diseases classified elsewhere: Secondary | ICD-10-CM | POA: Diagnosis present

## 2016-04-02 DIAGNOSIS — A419 Sepsis, unspecified organism: Secondary | ICD-10-CM | POA: Diagnosis present

## 2016-04-02 DIAGNOSIS — I76 Septic arterial embolism: Secondary | ICD-10-CM | POA: Diagnosis present

## 2016-04-02 DIAGNOSIS — F1990 Other psychoactive substance use, unspecified, uncomplicated: Secondary | ICD-10-CM | POA: Diagnosis present

## 2016-04-02 DIAGNOSIS — Z590 Homelessness: Secondary | ICD-10-CM

## 2016-04-02 DIAGNOSIS — F419 Anxiety disorder, unspecified: Secondary | ICD-10-CM | POA: Diagnosis present

## 2016-04-02 DIAGNOSIS — F1120 Opioid dependence, uncomplicated: Secondary | ICD-10-CM | POA: Diagnosis present

## 2016-04-02 DIAGNOSIS — D638 Anemia in other chronic diseases classified elsewhere: Secondary | ICD-10-CM | POA: Diagnosis present

## 2016-04-02 MED ORDER — SODIUM CHLORIDE 0.9 % IV BOLUS (SEPSIS)
2000.0000 mL | Freq: Once | INTRAVENOUS | Status: AC
  Administered 2016-04-03: 2000 mL via INTRAVENOUS

## 2016-04-02 NOTE — ED Triage Notes (Signed)
Pt recently treated for pneumonia and PE. Pt short of breath today with a productive cough.

## 2016-04-02 NOTE — ED Provider Notes (Signed)
[facility]-EMERGENCY DEPT Provider Note    By signing my name below, I, Earmon PhoenixJennifer Waddell, attest that this documentation has been prepared under the direction and in the presence of Tomasita CrumbleAdeleke Elisabella Hacker, MD. Electronically Signed: Earmon PhoenixJennifer Waddell, ED Scribe. 04/03/16. 12:03 AM.    History   Chief Complaint Chief Complaint  Patient presents with  . Shortness of Breath    The history is provided by the patient and medical records. No language interpreter was used.    HPI Comments:  Catherine Hernandez is a 30 y.o. female brought in by EMS, who presents to the Emergency Department complaining of SOB that began earlier today. She reports an associated productive cough of clear/yellow phlegm and states her lungs are sore from coughing. Pt reports she is currently being treated for pneumonia and a PE. She was discharged five days ago from here. She has not taken anything to treat her symptoms. Coughing increases her pain. She denies alleviating factors. She denies fever, chills, nausea, vomiting.    Past Medical History:  Diagnosis Date  . Anemia   . CAP (community acquired pneumonia) 11/16/2015  . Childhood asthma   . Enterococcal bacteremia   . Heroin abuse   . Hyponatremia   . Pneumonia 2007  . Polysubstance abuse     Patient Active Problem List   Diagnosis Date Noted  . Palliative care by specialist 03/24/2016  . DNR (do not resuscitate) discussion 03/24/2016  . Chest pain   . Severe tricuspid regurgitation   . Septic embolism (HCC)   . HCAP (healthcare-associated pneumonia) 03/15/2016  . Serratia sepsis (HCC) 03/15/2016  . Septicemia (HCC) 03/15/2016  . Iron deficiency anemia 03/15/2016  . Community acquired pneumonia 03/14/2016  . Heroin abuse 03/14/2016  . Symptomatic anemia 03/14/2016  . Leukocytosis 03/14/2016  . Multifocal pneumonia 03/14/2016  . Enterococcal sepsis (HCC) 12/19/2015  . Palliative care encounter   . Hypokalemia   . Noncompliance with medication regimen   .  Altered mental status 12/16/2015  . Ankle sprain 12/16/2015  . 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 Tachypnea   . Dyspnea   . 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 Polysubstance abuse   . Enterococcal bacteremia 11/18/2015  . Staphylococcus aureus bacteremia 11/18/2015  . Normocytic anemia 11/18/2015  . Protein-calorie malnutrition (HCC) 11/18/2015  . Poor dentition 11/18/2015  . Malnutrition of moderate degree 11/18/2015  . AKI (acute kidney injury) (HCC)   . Opioid use disorder, severe, dependence (HCC) 11/17/2015  . Opioid use with withdrawal (HCC) 11/17/2015  . Hyponatremia 11/17/2015  . Acute renal failure (HCC) 11/17/2015  . Hypocalcemia 11/17/2015  . Nonspecific abnormal electrocardiogram (ECG) (EKG) 11/17/2015  . Thrombocytopenia (HCC) 11/17/2015  . Sepsis (HCC) 11/16/2015    Past Surgical History:  Procedure Laterality Date  . NO PAST SURGERIES    . TEE WITHOUT CARDIOVERSION N/A 11/23/2015   Procedure: TRANSESOPHAGEAL ECHOCARDIOGRAM (TEE) WITH ANESTHESIA;  Surgeon: Quintella Reichertraci R Turner, MD;  Location: [facility];  Service: Cardiovascular;  Laterality: N/A;    OB History    Gravida Para Term Preterm AB Living   1 1 1  0 0 1   SAB TAB Ectopic Multiple Live Births   0 0 0 0 1       Home Medications    Prior to Admission medications   Medication Sig Start Date End Date Taking? Authorizing Provider  apixaban (ELIQUIS) 5 MG TABS tablet Take 1 tablet (5 mg total) by mouth every 12 (twelve) hours. Start on 03/31/16 03/31/16  Yes Ripudeep  Jenna Luo, MD  ciprofloxacin (CIPRO) 500 MG tablet Take 1 tablet (500 mg total) by mouth 2 (two) times daily. 03/28/16  Yes Ripudeep Jenna Luo, MD  apixaban (ELIQUIS) 5 MG TABS tablet Take 2 tablets (10 mg total) by mouth 2 (two) times daily. Till 03/30/16 Patient not taking: Reported on 04/03/2016 03/28/16   Ripudeep Jenna Luo, MD  feeding supplement (BOOST / RESOURCE BREEZE) LIQD Take 1 Container by mouth 2 (two) times daily between  meals. Patient not taking: Reported on 04/03/2016 12/02/15   Clydia Llano, MD  ferrous gluconate (FERGON) 324 MG tablet Take 1 tablet (324 mg total) by mouth 3 (three) times daily with meals. Patient not taking: Reported on 04/03/2016 03/28/16   Ripudeep Jenna Luo, MD  LORazepam (ATIVAN) 1 MG tablet Take 1 tablet (1 mg total) by mouth every 8 (eight) hours as needed for anxiety. Patient not taking: Reported on 04/03/2016 03/28/16   Ripudeep Jenna Luo, MD  Multiple Vitamin (MULTIVITAMIN WITH MINERALS) TABS tablet Take 1 tablet by mouth daily. Patient not taking: Reported on 04/03/2016 03/29/16   Ripudeep Jenna Luo, MD  traMADol (ULTRAM) 50 MG tablet Take 1 tablet (50 mg total) by mouth every 6 (six) hours as needed for severe pain. Patient not taking: Reported on 04/03/2016 03/28/16   Ripudeep Jenna Luo, MD    Family History Family History  Problem Relation Age of Onset  . Anesthesia problems Neg Hx   . Hypotension Neg Hx   . Malignant hyperthermia Neg Hx   . Pseudochol deficiency Neg Hx     Social History Social History  Substance Use Topics  . Smoking status: Current Every Day Smoker    Packs/day: 0.50    Years: 10.00    Types: Cigarettes  . Smokeless tobacco: Never Used  . Alcohol use No     Allergies   Patient has no known allergies.   Review of Systems Review of Systems A complete 10 system review of systems was obtained and all systems are negative except as noted in the HPI and PMH.    Physical Exam Updated Vital Signs BP 108/79   Pulse 116   Resp (!) 39   SpO2 100%   Physical Exam  Constitutional: She is oriented to person, place, and time. She appears cachectic. She appears distressed.  HENT:  Head: Normocephalic and atraumatic.  Nose: Nose normal.  Mouth/Throat: Oropharynx is clear and moist. No oropharyngeal exudate.  Eyes: Conjunctivae and EOM are normal. Pupils are equal, round, and reactive to light. No scleral icterus.  Neck: Normal range of motion. Neck supple. No JVD  present. No tracheal deviation present. No thyromegaly present.  Cardiovascular: Regular rhythm and normal heart sounds.  Tachycardia present.  Exam reveals no gallop and no friction rub.   No murmur heard. Pulmonary/Chest: Breath sounds normal. Tachypnea noted. No respiratory distress. She has no wheezes. She exhibits no tenderness.  Abdominal: Soft. Bowel sounds are normal. She exhibits no distension and no mass. There is no tenderness. There is no rebound and no guarding.  Musculoskeletal: Normal range of motion. She exhibits no edema or tenderness.  Lymphadenopathy:    She has no cervical adenopathy.  Neurological: She is alert and oriented to person, place, and time. No cranial nerve deficit. She exhibits normal muscle tone.  Skin: Skin is warm and dry. No rash noted. No erythema. No pallor.  Nursing note and vitals reviewed.    ED Treatments / Results  DIAGNOSTIC STUDIES: Oxygen Saturation is 100% on RA,  normal by my interpretation.   COORDINATION OF CARE: 11:57 PM- Will order labs. Pt verbalizes understanding and agrees to plan.  Medications  vancomycin (VANCOCIN) IVPB 1000 mg/200 mL premix (not administered)  sodium chloride 0.9 % bolus 2,000 mL (2,000 mLs Intravenous New Bag/Given 04/03/16 0026)  ceFEPIme (MAXIPIME) 2 g in dextrose 5 % 50 mL IVPB (2 g Intravenous New Bag/Given 04/03/16 0120)    Labs (all labs ordered are listed, but only abnormal results are displayed) Labs Reviewed  COMPREHENSIVE METABOLIC PANEL - Abnormal; Notable for the following:       Result Value   Chloride 98 (*)    Glucose, Bld 111 (*)    Calcium 8.6 (*)    Total Protein 8.7 (*)    Albumin 2.3 (*)    All other components within normal limits  CBC WITH DIFFERENTIAL/PLATELET - Abnormal; Notable for the following:    WBC 14.9 (*)    RBC 3.71 (*)    Hemoglobin 9.3 (*)    HCT 29.9 (*)    MCH 25.1 (*)    RDW 19.8 (*)    Neutro Abs 10.1 (*)    All other components within normal limits  CULTURE,  BLOOD (ROUTINE X 2)  CULTURE, BLOOD (ROUTINE X 2)  URINE CULTURE  BRAIN NATRIURETIC PEPTIDE  URINALYSIS, ROUTINE W REFLEX MICROSCOPIC  I-STAT CG4 LACTIC ACID, ED    EKG  EKG Interpretation  Date/Time:  Sunday April 02 2016 23:37:21 EST Ventricular Rate:  118 PR Interval:    QRS Duration: 91 QT Interval:  338 QTC Calculation: 474 R Axis:   114 Text Interpretation:  Sinus tachycardia Right axis deviation No significant change since last tracing Confirmed by Erroll Luna [phone]) on 04/03/2016 12:06:25 AM       Radiology Dg Chest 2 View  Result Date: 04/03/2016 CLINICAL DATA:  Chest pain, cough for 2 days. History of pulmonary emboli, polysubstance abuse, pneumonia. EXAM: CHEST  2 VIEW COMPARISON:  CT chest March 22, 2016 FINDINGS: Cardiac silhouette is mildly enlarged unchanged. Dense consolidation LEFT mid and lower lung zone with small LEFT pleural effusion. Patchy consolidation RIGHT lung. No pneumothorax. Soft tissue planes and included osseous structures are nonsuspicious. IMPRESSION: Probable worsening of multifocal pneumonia in a background of known septic emboli. Stable mild cardiomegaly. Electronically Signed   By: Awilda Metro M.D.   On: 04/03/2016 01:10    Procedures Procedures (including critical care time)  Medications Ordered in ED Medications  vancomycin (VANCOCIN) IVPB 1000 mg/200 mL premix (not administered)  sodium chloride 0.9 % bolus 2,000 mL (2,000 mLs Intravenous New Bag/Given 04/03/16 0026)  ceFEPIme (MAXIPIME) 2 g in dextrose 5 % 50 mL IVPB (2 g Intravenous New Bag/Given 04/03/16 0120)     Initial Impression / Assessment and Plan / ED Course  I have reviewed the triage vital signs and the nursing notes.  Pertinent labs & imaging results that were available during my care of the patient were reviewed by me and considered in my medical decision making (see chart for details).  Clinical Course     Patient presents emergency department for  chest pain shortness of breath. Her pain is worse on the left side and worse with palpation. She was recently diagnosed with pneumonia and states she's compliant with her oral antibiotics as well as her Eliquis for pulmonary embolus. She states she felt better in the hospital but upon discharge she continued to get worse. Chest x-ray reveals multifocal pneumonia. Sepsis protocol was ordered, patient  received 2 L of IV fluids for tachycardia as well as vancomycin and cefepime for pneumonia. Plan to admit to the hospitalist for further care.   CRITICAL CARE Performed by: Tomasita CrumbleNI,Rejina Odle   Total critical care time: 45 minutes - sepsis  Critical care time was exclusive of separately billable procedures and treating other patients.  Critical care was necessary to treat or prevent imminent or life-threatening deterioration.  Critical care was time spent personally by me on the following activities: development of treatment plan with patient and/or surrogate as well as nursing, discussions with consultants, evaluation of patient's response to treatment, examination of patient, obtaining history from patient or surrogate, ordering and performing treatments and interventions, ordering and review of laboratory studies, ordering and review of radiographic studies, pulse oximetry and re-evaluation of patient's condition.    I personally performed the services described in this documentation, which was scribed in my presence. The recorded information has been reviewed and is accurate.     Final Clinical Impressions(s) / ED Diagnoses   Final diagnoses:  HCAP (healthcare-associated pneumonia)    New Prescriptions New Prescriptions   No medications on file     Tomasita CrumbleAdeleke Haillee Johann, MD 04/03/16 0205

## 2016-04-03 ENCOUNTER — Encounter (HOSPITAL_COMMUNITY): Payer: Self-pay

## 2016-04-03 ENCOUNTER — Emergency Department (HOSPITAL_COMMUNITY): Payer: Medicaid Other

## 2016-04-03 DIAGNOSIS — I330 Acute and subacute infective endocarditis: Secondary | ICD-10-CM | POA: Diagnosis present

## 2016-04-03 DIAGNOSIS — Z8619 Personal history of other infectious and parasitic diseases: Secondary | ICD-10-CM | POA: Diagnosis not present

## 2016-04-03 DIAGNOSIS — K089 Disorder of teeth and supporting structures, unspecified: Secondary | ICD-10-CM | POA: Diagnosis not present

## 2016-04-03 DIAGNOSIS — Z8679 Personal history of other diseases of the circulatory system: Secondary | ICD-10-CM | POA: Diagnosis not present

## 2016-04-03 DIAGNOSIS — I76 Septic arterial embolism: Secondary | ICD-10-CM | POA: Diagnosis present

## 2016-04-03 DIAGNOSIS — Z681 Body mass index (BMI) 19 or less, adult: Secondary | ICD-10-CM | POA: Diagnosis not present

## 2016-04-03 DIAGNOSIS — Z590 Homelessness: Secondary | ICD-10-CM | POA: Diagnosis not present

## 2016-04-03 DIAGNOSIS — I272 Pulmonary hypertension, unspecified: Secondary | ICD-10-CM | POA: Diagnosis present

## 2016-04-03 DIAGNOSIS — F13239 Sedative, hypnotic or anxiolytic dependence with withdrawal, unspecified: Secondary | ICD-10-CM | POA: Diagnosis present

## 2016-04-03 DIAGNOSIS — Z720 Tobacco use: Secondary | ICD-10-CM | POA: Diagnosis present

## 2016-04-03 DIAGNOSIS — F1110 Opioid abuse, uncomplicated: Secondary | ICD-10-CM

## 2016-04-03 DIAGNOSIS — R0602 Shortness of breath: Secondary | ICD-10-CM | POA: Diagnosis present

## 2016-04-03 DIAGNOSIS — Z515 Encounter for palliative care: Secondary | ICD-10-CM | POA: Diagnosis not present

## 2016-04-03 DIAGNOSIS — Y95 Nosocomial condition: Secondary | ICD-10-CM | POA: Diagnosis present

## 2016-04-03 DIAGNOSIS — A419 Sepsis, unspecified organism: Secondary | ICD-10-CM | POA: Diagnosis present

## 2016-04-03 DIAGNOSIS — E43 Unspecified severe protein-calorie malnutrition: Secondary | ICD-10-CM | POA: Diagnosis present

## 2016-04-03 DIAGNOSIS — F1120 Opioid dependence, uncomplicated: Secondary | ICD-10-CM | POA: Diagnosis present

## 2016-04-03 DIAGNOSIS — F1121 Opioid dependence, in remission: Secondary | ICD-10-CM | POA: Diagnosis not present

## 2016-04-03 DIAGNOSIS — D638 Anemia in other chronic diseases classified elsewhere: Secondary | ICD-10-CM | POA: Diagnosis present

## 2016-04-03 DIAGNOSIS — J189 Pneumonia, unspecified organism: Secondary | ICD-10-CM | POA: Diagnosis present

## 2016-04-03 DIAGNOSIS — F1410 Cocaine abuse, uncomplicated: Secondary | ICD-10-CM | POA: Diagnosis present

## 2016-04-03 DIAGNOSIS — I368 Other nonrheumatic tricuspid valve disorders: Secondary | ICD-10-CM | POA: Diagnosis not present

## 2016-04-03 DIAGNOSIS — B9689 Other specified bacterial agents as the cause of diseases classified elsewhere: Secondary | ICD-10-CM | POA: Diagnosis not present

## 2016-04-03 DIAGNOSIS — Z7189 Other specified counseling: Secondary | ICD-10-CM | POA: Diagnosis not present

## 2016-04-03 DIAGNOSIS — R7881 Bacteremia: Secondary | ICD-10-CM | POA: Diagnosis not present

## 2016-04-03 DIAGNOSIS — R64 Cachexia: Secondary | ICD-10-CM | POA: Diagnosis present

## 2016-04-03 DIAGNOSIS — Z765 Malingerer [conscious simulation]: Secondary | ICD-10-CM | POA: Diagnosis not present

## 2016-04-03 DIAGNOSIS — I2690 Septic pulmonary embolism without acute cor pulmonale: Secondary | ICD-10-CM | POA: Diagnosis present

## 2016-04-03 DIAGNOSIS — Z7901 Long term (current) use of anticoagulants: Secondary | ICD-10-CM | POA: Diagnosis not present

## 2016-04-03 DIAGNOSIS — F1910 Other psychoactive substance abuse, uncomplicated: Secondary | ICD-10-CM | POA: Diagnosis not present

## 2016-04-03 DIAGNOSIS — F17210 Nicotine dependence, cigarettes, uncomplicated: Secondary | ICD-10-CM | POA: Diagnosis present

## 2016-04-03 DIAGNOSIS — Z86711 Personal history of pulmonary embolism: Secondary | ICD-10-CM | POA: Diagnosis not present

## 2016-04-03 DIAGNOSIS — Z8701 Personal history of pneumonia (recurrent): Secondary | ICD-10-CM | POA: Diagnosis not present

## 2016-04-03 DIAGNOSIS — R079 Chest pain, unspecified: Secondary | ICD-10-CM | POA: Diagnosis not present

## 2016-04-03 DIAGNOSIS — F1990 Other psychoactive substance use, unspecified, uncomplicated: Secondary | ICD-10-CM | POA: Diagnosis not present

## 2016-04-03 DIAGNOSIS — D649 Anemia, unspecified: Secondary | ICD-10-CM | POA: Diagnosis not present

## 2016-04-03 DIAGNOSIS — F419 Anxiety disorder, unspecified: Secondary | ICD-10-CM | POA: Diagnosis present

## 2016-04-03 DIAGNOSIS — D509 Iron deficiency anemia, unspecified: Secondary | ICD-10-CM | POA: Diagnosis present

## 2016-04-03 LAB — URINALYSIS, ROUTINE W REFLEX MICROSCOPIC
BILIRUBIN URINE: NEGATIVE
GLUCOSE, UA: NEGATIVE mg/dL
Ketones, ur: NEGATIVE mg/dL
Leukocytes, UA: NEGATIVE
NITRITE: NEGATIVE
PH: 6 (ref 5.0–8.0)
Protein, ur: 30 mg/dL — AB
SPECIFIC GRAVITY, URINE: 1.015 (ref 1.005–1.030)

## 2016-04-03 LAB — CBC WITH DIFFERENTIAL/PLATELET
BASOS ABS: 0 10*3/uL (ref 0.0–0.1)
Basophils Relative: 0 %
EOS ABS: 0.1 10*3/uL (ref 0.0–0.7)
Eosinophils Relative: 1 %
HEMATOCRIT: 29.9 % — AB (ref 36.0–46.0)
Hemoglobin: 9.3 g/dL — ABNORMAL LOW (ref 12.0–15.0)
LYMPHS ABS: 3.7 10*3/uL (ref 0.7–4.0)
Lymphocytes Relative: 25 %
MCH: 25.1 pg — ABNORMAL LOW (ref 26.0–34.0)
MCHC: 31.1 g/dL (ref 30.0–36.0)
MCV: 80.6 fL (ref 78.0–100.0)
MONO ABS: 1 10*3/uL (ref 0.1–1.0)
Monocytes Relative: 7 %
NEUTROS PCT: 67 %
Neutro Abs: 10.1 10*3/uL — ABNORMAL HIGH (ref 1.7–7.7)
PLATELETS: 336 10*3/uL (ref 150–400)
RBC: 3.71 MIL/uL — AB (ref 3.87–5.11)
RDW: 19.8 % — AB (ref 11.5–15.5)
WBC: 14.9 10*3/uL — AB (ref 4.0–10.5)

## 2016-04-03 LAB — RESPIRATORY PANEL BY PCR
Adenovirus: NOT DETECTED
Bordetella pertussis: NOT DETECTED
CHLAMYDOPHILA PNEUMONIAE-RVPPCR: NOT DETECTED
Coronavirus 229E: NOT DETECTED
Coronavirus HKU1: NOT DETECTED
Coronavirus NL63: NOT DETECTED
Coronavirus OC43: NOT DETECTED
INFLUENZA A-RVPPCR: NOT DETECTED
INFLUENZA B-RVPPCR: NOT DETECTED
MYCOPLASMA PNEUMONIAE-RVPPCR: NOT DETECTED
Metapneumovirus: NOT DETECTED
PARAINFLUENZA VIRUS 4-RVPPCR: NOT DETECTED
Parainfluenza Virus 1: NOT DETECTED
Parainfluenza Virus 2: NOT DETECTED
Parainfluenza Virus 3: NOT DETECTED
RESPIRATORY SYNCYTIAL VIRUS-RVPPCR: NOT DETECTED
Rhinovirus / Enterovirus: NOT DETECTED

## 2016-04-03 LAB — COMPREHENSIVE METABOLIC PANEL
ALBUMIN: 2.3 g/dL — AB (ref 3.5–5.0)
ALK PHOS: 111 U/L (ref 38–126)
ALT: 20 U/L (ref 14–54)
ANION GAP: 12 (ref 5–15)
AST: 22 U/L (ref 15–41)
BUN: 14 mg/dL (ref 6–20)
CALCIUM: 8.6 mg/dL — AB (ref 8.9–10.3)
CO2: 25 mmol/L (ref 22–32)
Chloride: 98 mmol/L — ABNORMAL LOW (ref 101–111)
Creatinine, Ser: 0.77 mg/dL (ref 0.44–1.00)
GFR calc Af Amer: >60 mL/min (ref >60)
GFR calc non Af Amer: >60 mL/min (ref >60)
GLUCOSE: 111 mg/dL — AB (ref 65–99)
Potassium: 3.6 mmol/L (ref 3.5–5.1)
SODIUM: 135 mmol/L (ref 135–145)
TOTAL PROTEIN: 8.7 g/dL — AB (ref 6.5–8.1)
Total Bilirubin: 0.5 mg/dL (ref 0.3–1.2)

## 2016-04-03 LAB — URINALYSIS, MICROSCOPIC (REFLEX)

## 2016-04-03 LAB — RAPID URINE DRUG SCREEN, HOSP PERFORMED
Amphetamines: NOT DETECTED
BENZODIAZEPINES: POSITIVE — AB
Barbiturates: NOT DETECTED
COCAINE: POSITIVE — AB
OPIATES: NOT DETECTED
Tetrahydrocannabinol: POSITIVE — AB

## 2016-04-03 LAB — I-STAT CG4 LACTIC ACID, ED: LACTIC ACID, VENOUS: 1.65 mmol/L (ref 0.5–1.9)

## 2016-04-03 LAB — PROCALCITONIN: PROCALCITONIN: 0.83 ng/mL

## 2016-04-03 LAB — MRSA PCR SCREENING: MRSA BY PCR: NEGATIVE

## 2016-04-03 LAB — BRAIN NATRIURETIC PEPTIDE: B Natriuretic Peptide: 89.9 pg/mL (ref 0.0–100.0)

## 2016-04-03 LAB — LACTIC ACID, PLASMA
LACTIC ACID, VENOUS: 1.2 mmol/L (ref 0.5–1.9)
Lactic Acid, Venous: 1.2 mmol/L (ref 0.5–1.9)

## 2016-04-03 LAB — INFLUENZA PANEL BY PCR (TYPE A & B)
Influenza A By PCR: NEGATIVE
Influenza B By PCR: NEGATIVE

## 2016-04-03 LAB — PROTIME-INR
INR: 1.25
PROTHROMBIN TIME: 15.8 s — AB (ref 11.4–15.2)

## 2016-04-03 LAB — STREP PNEUMONIAE URINARY ANTIGEN: Strep Pneumo Urinary Antigen: NEGATIVE

## 2016-04-03 LAB — APTT: APTT: 41 s — AB (ref 24–36)

## 2016-04-03 MED ORDER — LORAZEPAM 2 MG/ML IJ SOLN
0.0000 mg | Freq: Four times a day (QID) | INTRAMUSCULAR | Status: AC
Start: 2016-04-03 — End: 2016-04-05
  Administered 2016-04-03: 4 mg via INTRAVENOUS
  Administered 2016-04-03: 2 mg via INTRAVENOUS
  Administered 2016-04-04: 1 mg via INTRAVENOUS
  Administered 2016-04-04 – 2016-04-05 (×3): 2 mg via INTRAVENOUS
  Administered 2016-04-05 (×2): 4 mg via INTRAVENOUS
  Filled 2016-04-03: qty 1
  Filled 2016-04-03: qty 2
  Filled 2016-04-03 (×3): qty 1
  Filled 2016-04-03 (×3): qty 2
  Filled 2016-04-03: qty 1

## 2016-04-03 MED ORDER — LORAZEPAM 1 MG PO TABS
1.0000 mg | ORAL_TABLET | Freq: Three times a day (TID) | ORAL | Status: DC | PRN
  Administered 2016-04-03 (×2): 1 mg via ORAL
  Filled 2016-04-03 (×2): qty 1

## 2016-04-03 MED ORDER — APIXABAN 5 MG PO TABS
5.0000 mg | ORAL_TABLET | Freq: Two times a day (BID) | ORAL | Status: DC
  Filled 2016-04-03: qty 1

## 2016-04-03 MED ORDER — SODIUM CHLORIDE 0.9 % IV SOLN
INTRAVENOUS | Status: DC
  Administered 2016-04-03 – 2016-04-04 (×3): via INTRAVENOUS
  Administered 2016-04-05: 1000 mL via INTRAVENOUS

## 2016-04-03 MED ORDER — SODIUM CHLORIDE 0.9 % IV BOLUS (SEPSIS)
1000.0000 mL | Freq: Once | INTRAVENOUS | Status: AC
  Administered 2016-04-03: 1000 mL via INTRAVENOUS

## 2016-04-03 MED ORDER — VANCOMYCIN HCL 500 MG IV SOLR
500.0000 mg | Freq: Two times a day (BID) | INTRAVENOUS | Status: DC
  Administered 2016-04-03 – 2016-04-06 (×7): 500 mg via INTRAVENOUS
  Filled 2016-04-03 (×8): qty 500

## 2016-04-03 MED ORDER — LORAZEPAM 1 MG PO TABS
1.0000 mg | ORAL_TABLET | Freq: Four times a day (QID) | ORAL | Status: AC | PRN
  Administered 2016-04-05: 1 mg via ORAL
  Filled 2016-04-03: qty 1

## 2016-04-03 MED ORDER — DIAZEPAM 5 MG PO TABS
5.0000 mg | ORAL_TABLET | Freq: Once | ORAL | Status: AC
  Administered 2016-04-03: 5 mg via ORAL
  Filled 2016-04-03: qty 1

## 2016-04-03 MED ORDER — THIAMINE HCL 100 MG/ML IJ SOLN: 100.0000 mg | Freq: Every day | INTRAMUSCULAR | Status: DC

## 2016-04-03 MED ORDER — LORAZEPAM 2 MG/ML IJ SOLN
0.0000 mg | Freq: Two times a day (BID) | INTRAMUSCULAR | Status: AC
Start: 2016-04-05 — End: 2016-04-07
  Administered 2016-04-05 – 2016-04-07 (×4): 2 mg via INTRAVENOUS
  Filled 2016-04-03 (×5): qty 1

## 2016-04-03 MED ORDER — VITAMIN B-1 100 MG PO TABS
100.0000 mg | ORAL_TABLET | Freq: Every day | ORAL | Status: DC
  Administered 2016-04-03 – 2016-04-09 (×7): 100 mg via ORAL
  Filled 2016-04-03 (×7): qty 1

## 2016-04-03 MED ORDER — ACETAMINOPHEN 325 MG PO TABS
650.0000 mg | ORAL_TABLET | Freq: Four times a day (QID) | ORAL | Status: DC | PRN
  Administered 2016-04-03 – 2016-04-07 (×4): 650 mg via ORAL
  Filled 2016-04-03 (×5): qty 2

## 2016-04-03 MED ORDER — LEVALBUTEROL HCL 1.25 MG/0.5ML IN NEBU
1.2500 mg | INHALATION_SOLUTION | Freq: Four times a day (QID) | RESPIRATORY_TRACT | Status: DC | PRN
  Filled 2016-04-03: qty 0.5

## 2016-04-03 MED ORDER — VANCOMYCIN HCL IN DEXTROSE 1-5 GM/200ML-% IV SOLN
1000.0000 mg | Freq: Once | INTRAVENOUS | Status: AC
  Administered 2016-04-03: 1000 mg via INTRAVENOUS
  Filled 2016-04-03: qty 200

## 2016-04-03 MED ORDER — CEFEPIME HCL 1 G IJ SOLR
1.0000 g | Freq: Three times a day (TID) | INTRAMUSCULAR | Status: DC
  Administered 2016-04-03 – 2016-04-09 (×19): 1 g via INTRAVENOUS
  Filled 2016-04-03 (×21): qty 1

## 2016-04-03 MED ORDER — DEXTROSE 5 % IV SOLN
2.0000 g | Freq: Once | INTRAVENOUS | Status: AC
  Administered 2016-04-03: 2 g via INTRAVENOUS
  Filled 2016-04-03: qty 2

## 2016-04-03 MED ORDER — FOLIC ACID 1 MG PO TABS
1.0000 mg | ORAL_TABLET | Freq: Every day | ORAL | Status: DC
  Administered 2016-04-03 – 2016-04-09 (×7): 1 mg via ORAL
  Filled 2016-04-03 (×7): qty 1

## 2016-04-03 MED ORDER — AZITHROMYCIN 500 MG PO TABS: 500.0000 mg | ORAL_TABLET | Freq: Every day | ORAL | Status: DC

## 2016-04-03 MED ORDER — NICOTINE 21 MG/24HR TD PT24
21.0000 mg | MEDICATED_PATCH | Freq: Every day | TRANSDERMAL | Status: DC
  Administered 2016-04-03 – 2016-04-04 (×2): 21 mg via TRANSDERMAL
  Filled 2016-04-03 (×5): qty 1

## 2016-04-03 MED ORDER — ADULT MULTIVITAMIN W/MINERALS CH
1.0000 | ORAL_TABLET | Freq: Every day | ORAL | Status: DC
  Administered 2016-04-03 – 2016-04-09 (×7): 1 via ORAL
  Filled 2016-04-03 (×7): qty 1

## 2016-04-03 MED ORDER — FERROUS GLUCONATE 324 (38 FE) MG PO TABS
324.0000 mg | ORAL_TABLET | Freq: Three times a day (TID) | ORAL | Status: DC
  Administered 2016-04-03 – 2016-04-09 (×18): 324 mg via ORAL
  Filled 2016-04-03 (×18): qty 1

## 2016-04-03 MED ORDER — ZOLPIDEM TARTRATE 5 MG PO TABS
5.0000 mg | ORAL_TABLET | Freq: Every evening | ORAL | Status: DC | PRN
  Administered 2016-04-07 – 2016-04-08 (×2): 5 mg via ORAL
  Filled 2016-04-03 (×2): qty 1

## 2016-04-03 MED ORDER — ONDANSETRON HCL 4 MG/2ML IJ SOLN
4.0000 mg | Freq: Three times a day (TID) | INTRAMUSCULAR | Status: DC | PRN
  Administered 2016-04-03: 4 mg via INTRAVENOUS
  Filled 2016-04-03: qty 2

## 2016-04-03 MED ORDER — AZITHROMYCIN 500 MG PO TABS: 250.0000 mg | ORAL_TABLET | Freq: Every day | ORAL | Status: DC

## 2016-04-03 MED ORDER — LORAZEPAM 2 MG/ML IJ SOLN
1.0000 mg | Freq: Four times a day (QID) | INTRAMUSCULAR | Status: AC | PRN
  Administered 2016-04-06 (×2): 1 mg via INTRAVENOUS
  Filled 2016-04-03: qty 1

## 2016-04-03 MED ORDER — DM-GUAIFENESIN ER 30-600 MG PO TB12
1.0000 | ORAL_TABLET | Freq: Two times a day (BID) | ORAL | Status: DC
  Administered 2016-04-03 – 2016-04-09 (×14): 1 via ORAL
  Filled 2016-04-03 (×14): qty 1

## 2016-04-03 MED ORDER — APIXABAN 5 MG PO TABS
5.0000 mg | ORAL_TABLET | Freq: Two times a day (BID) | ORAL | Status: DC
  Administered 2016-04-03 – 2016-04-09 (×14): 5 mg via ORAL
  Filled 2016-04-03 (×14): qty 1

## 2016-04-03 NOTE — Progress Notes (Signed)
Patient appears to be going through withdrawal. Heart rate and respiratory rate elevated. Patient c/o nausea and itching. Patient also appears very restless. Dr. Susie CassetteAbrol notified. New orders received. Will monitor.  Leanna BattlesEckelmann, Joncarlos Atkison Eileen, RN.

## 2016-04-03 NOTE — ED Notes (Signed)
Patient transported to X-ray 

## 2016-04-03 NOTE — ED Notes (Signed)
Delay on 2nd set of blood cultures,  Pt crying stating she wants pain medication first.

## 2016-04-03 NOTE — H&P (Signed)
History and Physical    ZHOE CATANIA VZD:[id number] DOB: 1985-05-11 DOA: 04/02/2016  Referring MD/NP/PA:   PCP: No PCP Per Patient   Patient coming from:  The patient is coming from home.  At baseline, pt is independent for most of ADL.   Chief Complaint: cough, chest pain and SOB  HPI: Catherine Hernandez is a 31 y.o. female with medical history significant of polysubstance abuse (heroin, opioid-dependence, cocaine, tobacco), asthma, anxiety, anemia, serratia bacteremia, multifocal pneumonia with cavitation, septic emboli, endocarditis, PE on Eliquis, who presents with cough and SOB.  Pt was recently hospitalized from 12/17 to 12/26 due to sepsis secondary to serratia bacteremia, multifocal pneumonia with cavitation, septic emboli, endocarditis and PE. Pt was discharged on Cipro and Eliquis for PE. Today she comes back due to worsening SOB, chest pain and cough. She coughs up yellowish sputum. She states that she has frontal chest pain, which is constant,10/10, pleuritic, nonradiating. She denies fever or chills. She states that she has been compliant to Eliquis. Denies runny nose or sore throat. She does not have nausea, vomiting, abdominal pain, diarrhea, symptoms of UTI or unilateral weakness.  ED Course: pt was found to have WBC 14.9, lactate 1.65, BNP 89.9, electrolytes & renal function okay, temperature 98.9, tachycardia with HR up to 126, tachycardia, O2 sat 97% on room air. Chest x-ray showed worsening multifocal pneumonia. Pt is admitted to SDU as inpt.  Review of Systems:   General: no fevers, chills, no changes in body weight, has poor appetite, has fatigue HEENT: no blurry vision, hearing changes or sore throat Respiratory: has dyspnea, coughing, no eezing CV: has chest pain, no palpitations GI: no nausea, vomiting, abdominal pain, diarrhea, constipation GU: no dysuria, burning on urination, increased urinary frequency, hematuria  Ext: no leg edema Neuro: no unilateral weakness,  numbness, or tingling, no vision change or hearing loss Skin: no rash, no skin tear. MSK: No muscle spasm, no deformity, no limitation of range of movement in spin Heme: No easy bruising.  Travel history: No recent long distant travel.  Allergy: No Known Allergies  Past Medical History:  Diagnosis Date  . Anemia   . CAP (community acquired pneumonia) 11/16/2015  . Childhood asthma   . Enterococcal bacteremia   . Heroin abuse   . Hyponatremia   . Pneumonia 2007  . Polysubstance abuse     Past Surgical History:  Procedure Laterality Date  . NO PAST SURGERIES    . TEE WITHOUT CARDIOVERSION N/A 11/23/2015   Procedure: TRANSESOPHAGEAL ECHOCARDIOGRAM (TEE) WITH ANESTHESIA;  Surgeon: Quintella Reichert, MD;  Location: [facility];  Service: Cardiovascular;  Laterality: N/A;    Social History:  reports that she has been smoking Cigarettes.  She has a 5.00 pack-year smoking history. She has never used smokeless tobacco. She reports that she uses drugs, including IV, Cocaine, Heroin, and Oxycodone. She reports that she does not drink alcohol.  Family History:  Family History  Problem Relation Age of Onset  . Anesthesia problems Neg Hx   . Hypotension Neg Hx   . Malignant hyperthermia Neg Hx   . Pseudochol deficiency Neg Hx      Prior to Admission medications   Medication Sig Start Date End Date Taking? Authorizing Provider  apixaban (ELIQUIS) 5 MG TABS tablet Take 1 tablet (5 mg total) by mouth every 12 (twelve) hours. Start on 03/31/16 03/31/16  Yes Ripudeep Jenna Luo, MD  ciprofloxacin (CIPRO) 500 MG tablet Take 1 tablet (500 mg total) by mouth  2 (two) times daily. 03/28/16  Yes Ripudeep Jenna LuoK Rai, MD  apixaban (ELIQUIS) 5 MG TABS tablet Take 2 tablets (10 mg total) by mouth 2 (two) times daily. Till 03/30/16 Patient not taking: Reported on 04/03/2016 03/28/16   Ripudeep Jenna LuoK Rai, MD  feeding supplement (BOOST / RESOURCE BREEZE) LIQD Take 1 Container by mouth 2 (two) times daily between  meals. Patient not taking: Reported on 04/03/2016 12/02/15   Clydia LlanoMutaz Elmahi, MD  ferrous gluconate (FERGON) 324 MG tablet Take 1 tablet (324 mg total) by mouth 3 (three) times daily with meals. Patient not taking: Reported on 04/03/2016 03/28/16   Ripudeep Jenna LuoK Rai, MD  LORazepam (ATIVAN) 1 MG tablet Take 1 tablet (1 mg total) by mouth every 8 (eight) hours as needed for anxiety. Patient not taking: Reported on 04/03/2016 03/28/16   Ripudeep Jenna LuoK Rai, MD  Multiple Vitamin (MULTIVITAMIN WITH MINERALS) TABS tablet Take 1 tablet by mouth daily. Patient not taking: Reported on 04/03/2016 03/29/16   Ripudeep Jenna LuoK Rai, MD  traMADol (ULTRAM) 50 MG tablet Take 1 tablet (50 mg total) by mouth every 6 (six) hours as needed for severe pain. Patient not taking: Reported on 04/03/2016 03/28/16   Ripudeep Jenna LuoK Rai, MD    Physical Exam: Vitals:   04/03/16 0036 04/03/16 0045 04/03/16 0107 04/03/16 0109  BP:   101/73   Pulse:  116 115 113  Resp:  (!) 30    Temp: 98.9 F (37.2 C)     TempSrc: Rectal     SpO2:  99% 99% 99%   General: Not in acute distress. Cachectic looking. HEENT:       Eyes: PERRL, EOMI, no scleral icterus.       ENT: No discharge from the ears and nose, no pharynx injection, no tonsillar enlargement.        Neck: No JVD, no bruit, no mass felt. Heme: No neck lymph node enlargement. Cardiac: S1/S2, RRR, tachycardia, No murmurs, No gallops or rubs. Respiratory: No rales, wheezing, rhonchi or rubs. GI: Soft, nondistended, nontender, no rebound pain, no organomegaly, BS present. GU: No hematuria Ext: No pitting leg edema bilaterally. 2+DP/PT pulse bilaterally. Musculoskeletal: No joint deformities, No joint redness or warmth, no limitation of ROM in spin. Skin: No rashes.  Neuro: Alert, oriented X3, cranial nerves II-XII grossly intact, moves all extremities normally.  Psych: Patient is not psychotic, no suicidal or hemocidal ideation.  Labs on Admission: I have personally reviewed following labs and  imaging studies  CBC:  Recent Labs Lab 03/28/16 0612 04/03/16 0021  WBC 12.4* 14.9*  NEUTROABS  --  10.1*  HGB 9.2* 9.3*  HCT 30.9* 29.9*  MCV 83.7 80.6  PLT 242 336   Basic Metabolic Panel:  Recent Labs Lab 03/28/16 0612 04/03/16 0021  NA 134* 135  K 4.1 3.6  CL 100* 98*  CO2 22 25  GLUCOSE 130* 111*  BUN 9 14  CREATININE 0.69 0.77  CALCIUM 8.5* 8.6*   GFR: Estimated Creatinine Clearance: 71.9 mL/min (by C-G formula based on SCr of 0.77 mg/dL). Liver Function Tests:  Recent Labs Lab 04/03/16 0021  AST 22  ALT 20  ALKPHOS 111  BILITOT 0.5  PROT 8.7*  ALBUMIN 2.3*   No results for input(s): LIPASE, AMYLASE in the last 168 hours. No results for input(s): AMMONIA in the last 168 hours. Coagulation Profile: No results for input(s): INR, PROTIME in the last 168 hours. Cardiac Enzymes: No results for input(s): CKTOTAL, CKMB, CKMBINDEX, TROPONINI in the last 168  hours. BNP (last 3 results) No results for input(s): PROBNP in the last 8760 hours. HbA1C: No results for input(s): HGBA1C in the last 72 hours. CBG: No results for input(s): GLUCAP in the last 168 hours. Lipid Profile: No results for input(s): CHOL, HDL, LDLCALC, TRIG, CHOLHDL, LDLDIRECT in the last 72 hours. Thyroid Function Tests: No results for input(s): TSH, T4TOTAL, FREET4, T3FREE, THYROIDAB in the last 72 hours. Anemia Panel: No results for input(s): VITAMINB12, FOLATE, FERRITIN, TIBC, IRON, RETICCTPCT in the last 72 hours. Urine analysis:    Component Value Date/Time   COLORURINE YELLOW 03/19/2016 2139   APPEARANCEUR CLEAR 03/19/2016 2139   LABSPEC 1.014 03/19/2016 2139   PHURINE 8.0 03/19/2016 2139   GLUCOSEU 50 (A) 03/19/2016 2139   HGBUR MODERATE (A) 03/19/2016 2139   BILIRUBINUR NEGATIVE 03/19/2016 2139   KETONESUR NEGATIVE 03/19/2016 2139   PROTEINUR 30 (A) 03/19/2016 2139   UROBILINOGEN 0.2 01/07/2012 1140   NITRITE NEGATIVE 03/19/2016 2139   LEUKOCYTESUR NEGATIVE 03/19/2016  2139   Sepsis Labs: @LABRCNTIP (procalcitonin:4,lacticidven:4) )No results found for this or any previous visit (from the past 240 hour(s)).   Radiological Exams on Admission: Dg Chest 2 View  Result Date: 04/03/2016 CLINICAL DATA:  Chest pain, cough for 2 days. History of pulmonary emboli, polysubstance abuse, pneumonia. EXAM: CHEST  2 VIEW COMPARISON:  CT chest March 22, 2016 FINDINGS: Cardiac silhouette is mildly enlarged unchanged. Dense consolidation LEFT mid and lower lung zone with small LEFT pleural effusion. Patchy consolidation RIGHT lung. No pneumothorax. Soft tissue planes and included osseous structures are nonsuspicious. IMPRESSION: Probable worsening of multifocal pneumonia in a background of known septic emboli. Stable mild cardiomegaly. Electronically Signed   By: Awilda Metro M.D.   On: 04/03/2016 01:10     EKG: Independently reviewed. Sinus rhythm, QTC 474, RAD, anteroseptal infarction pattern.   Assessment/Plan Principal Problem:   Sepsis (HCC) Active Problems:   Opioid use disorder, severe, dependence (HCC)   Protein-calorie malnutrition, severe (HCC)   Tricuspid valve vegetation   Heroin abuse   HCAP (healthcare-associated pneumonia)   Iron deficiency anemia   Septic embolism (HCC)   Tobacco abuse   Sepsis due to HCAP, with hx of serratia bacteremia, multifocal pneumonia with cavitation and septic emboli: pt is septic with leukocytosis, tachycardia and tachypnea. Lactate is normal. Hemodynamically stable. Per previous discharge summary, "pt is not a candidate for home PICC line. 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 CT surgery was consulted. Patient was seen by Dr. Morton Peters who felt that patient would not survive heartfelt surgery and should be treated with antibiotics with emphasis on comfort care."  Pt was discharged on Cipro for one month  per ID, Dr. Daiva Eves.  - Will admit to SDU as inpt - IV Vancomycin and cefepime were started in ED, will continue - will add Z pak - Mucinex for cough  - prn Xopenex Neb for SOB - Urine legionella and S. pneumococcal antigen - Follow up blood culture x2, sputum culture and respiratory virus panel, plus Flu pcr - will get Procalcitonin and trend lactic acid level per sepsis protocol - IVF: 2L of NS bolus in ED, followed by 75 mL per hour of NS  - tylenol for fever - will consult to palliative medicine   Bilateral Pulmonary embolism:  -continue Eliquis  Polysubstance abuse: heroin, opioid-dependence, cocaine, tobacco -counseled strongly. -Ativan for withdraw. Currently no symptoms for withdraw -nicotine  patch   Anemia of chronic disease and iron deficiency anemia: hgb stable. 9.2 on 03/28/16-->9.3 today. -continue iron supplement  Severe Protein calorie malnutrition  - Nutrition consult  DVT ppx: On eliquis Code Status: Full code (pt was DNR before, but she wants to be full code today) Family Communication: None at bed side.   Disposition Plan:  Anticipate discharge back to previous home environment Consults called:  none Admission status: SDU/inpation       Date of Service 04/03/2016    Lorretta Harp [facility] Pager [phone]  If 7PM-7AM, please contact night-coverage [email or web address] Password [email or web address] 04/03/2016, 2:25 AM

## 2016-04-03 NOTE — Consult Note (Signed)
Consultation Note Date: 04/03/2016   Patient Name: Catherine Hernandez  DOB: 05-19-1985  MRN: [id number]  Age / Sex: 31 y.o., female  PCP: No Pcp Per Patient Referring Physician: Richarda Overlie, MD  Reason for Consultation: Establishing goals of care and Psychosocial/spiritual support  HPI/Patient Profile:  31 y.o. female with medical history significant of polysubstance abuse (heroin, opioid-dependence, cocaine, tobacco), asthma, anxiety, anemia, serratia bacteremia, multifocal pneumonia with cavitation, septic emboli, endocarditis, PE on Eliquis, who presented with cough and SOB.  Pt was recently hospitalized from 12/17 to 12/26 due to sepsis secondary to serratia bacteremia, multifocal pneumonia with cavitation, septic emboli, endocarditis and PE. Pt was discharged on Cipro and Eliquis for PE.   Today she comes back due to worsening SOB, chest pain and cough. Productive cough for yellowish sputum. She states that she has frontal chest pain, which is constant,10/10, pleuritic, nonradiating. She denies fever or chills. She states that she has been compliant to Eliquis. Denies runny nose or sore throat. She does not have nausea, vomiting, abdominal pain, diarrhea, symptoms of UTI or unilateral weakness.   In the ER  pt was found to have WBC 14.9, lactate 1.65, BNP 89.9, electrolytes & renal function okay, temperature 98.9, tachycardia with HR up to 126, tachycardia, O2 sat 97% on room air. Chest x-ray showed worsening multifocal pneumonia. Pt is admitted to SDU as inpt.  I placed a call to contact,  SO/ Denzil Magnuson who was unaware she was in the hospital.  He tells me he took her to his Dad's house after the last discharge 03-28-16. "I felt like I needed to give her one more chance".  "She no sooner got there, starting smoking cigarettes, eating her Ativan and Tramadol like candy, and when they were gone demanded to  back to [place] camp [place]  So I took her back and dropped her off"   ,  "I guess that where she got more drugs"  He is grateful for the phone call and update.  However he does not want to be considered her main support person.  The patient is cachetic and protein malnourished with albumin 2.3  Per cardiothoracic surgery  the patient would not survive heart valve surgery and should be treated with antibiotics and emphasis on comfort care  Patient faces complicated emotional and psychological  Issues, addiction,  along with facing her own mortality. "I am afraid to die"  Will continue to meet with patient while hospitalized   Clinical Assessment and Goals of Care:  This NP Lorinda Creed reviewed medical records, received report from team, assessed the patient and then meet at the patient's bedside to discuss diagnosis and  prognosis, GOC, and options.   A discussion was had today regarding advanced directive and HPOA.  Concepts specific to code status,continued IV antibiotics and rehospitalization and disposition  was had.  The difference between a aggressive medical intervention path  and a palliative comfort care path for this patient at this time was had.  Values and goals  of care important to patient and family were attempted to be elicited.  I shared with [place] that the medical team is recommending a comfort approach, and if she indeed did want to forego life prolonging measures she would likely  be eligible for hospice facility.  At this time she hopes for all offered and available medical   treatment and improvement.  SUMMARY OF RECOMMENDATIONS    Code Status/Advance Care Planning:  Full code- discussed in detail the importance of establishing a HPOA and realistic discussion regarding a resuscitation event, and known poor outcomes in silimilar patients.  Patient tells me she doesn't want her mother to be her decision maker (mother has addiction issues also),   she looks to  Denzil Magnuson  to be her HPOA.    I spoke to [place] today by telephone, he tells me he has to step away from [place] at this time.  He has tried to help but to no avail, he does not want to be her HPOA in anyway at this time.  Symptom management  Consider resuming home medications.    This is a difficult situation in the complexities of addiction.  There is no easy answer.  Ultimately an intense rehabilitation may be useful but with underlying medical problems and patient's own resistance to past attempts this seems to be unrealistic at this time.     Palliative Prophylaxis:    Bowel Regimen, Delirium Protocol, Frequent Pain Assessment and Oral Care   Psycho-social/Spiritual:   Desire for further Chaplaincy support:yes  Additional Recommendations: Hospice education  Prognosis:   Difficult to determine, dependant on life prolonging measures   Long term prognosis is poor.  Discharge Planning: To be determined     Primary Diagnoses: Present on Admission: . HCAP (healthcare-associated pneumonia) . Heroin abuse . Iron deficiency anemia . Protein-calorie malnutrition, severe (HCC) . Sepsis (HCC) . Septic embolism (HCC) . Tricuspid valve vegetation . Opioid use disorder, severe, dependence (HCC) . Tobacco abuse   I have reviewed the medical record, interviewed the patient and family, and examined the patient. The following aspects are pertinent.  Past Medical History:  Diagnosis Date  . Anemia   . CAP (community acquired pneumonia) 11/16/2015  . Childhood asthma   . Enterococcal bacteremia   . Heroin abuse   . Hyponatremia   . Pneumonia 2007  . Polysubstance abuse    Social History   Social History  . Marital status: Single    Spouse name: N/A  . Number of children: N/A  . Years of education: N/A   Occupational History  . unemployed, former Film/video editor    Social History Main Topics  . Smoking status: Current Every Day Smoker     Packs/day: 0.50    Years: 10.00    Types: Cigarettes  . Smokeless tobacco: Never Used  . Alcohol use No  . Drug use:     Types: IV, Cocaine, Heroin, Oxycodone     Comment: 11/17/2015 "been only doing pills lately to try to get offf H"  . Sexual activity: Yes    Birth control/ protection: Condom   Other Topics Concern  . None   Social History Narrative  . None   Family History  Problem Relation Age of Onset  . Anesthesia problems Neg Hx   . Hypotension Neg Hx   . Malignant hyperthermia Neg Hx   . Pseudochol deficiency Neg Hx    Scheduled Meds: . apixaban  5 mg Oral BID  . ceFEPime (MAXIPIME) IV  1 g Intravenous Q8H  . dextromethorphan-guaiFENesin  1 tablet Oral BID  . ferrous gluconate  324 mg Oral TID WC  . multivitamin with minerals  1 tablet Oral Daily  . nicotine  21 mg Transdermal Daily  . vancomycin  500 mg Intravenous Q12H   Continuous Infusions: . sodium chloride 75 mL/hr at 04/03/16 0659   PRN Meds:.levalbuterol, LORazepam, ondansetron, zolpidem Medications Prior to Admission:  Prior to Admission medications   Medication Sig Start Date End Date Taking? Authorizing Provider  apixaban (ELIQUIS) 5 MG TABS tablet Take 1 tablet (5 mg total) by mouth every 12 (twelve) hours. Start on 03/31/16 03/31/16  Yes Ripudeep Jenna Luo, MD  ciprofloxacin (CIPRO) 500 MG tablet Take 1 tablet (500 mg total) by mouth 2 (two) times daily. 03/28/16  Yes Ripudeep Jenna Luo, MD  apixaban (ELIQUIS) 5 MG TABS tablet Take 2 tablets (10 mg total) by mouth 2 (two) times daily. Till 03/30/16 Patient not taking: Reported on 04/03/2016 03/28/16   Ripudeep Jenna Luo, MD  feeding supplement (BOOST / RESOURCE BREEZE) LIQD Take 1 Container by mouth 2 (two) times daily between meals. Patient not taking: Reported on 04/03/2016 12/02/15   Clydia Llano, MD  ferrous gluconate (FERGON) 324 MG tablet Take 1 tablet (324 mg total) by mouth 3 (three) times daily with meals. Patient not taking: Reported on 04/03/2016 03/28/16    Ripudeep Jenna Luo, MD  LORazepam (ATIVAN) 1 MG tablet Take 1 tablet (1 mg total) by mouth every 8 (eight) hours as needed for anxiety. Patient not taking: Reported on 04/03/2016 03/28/16   Ripudeep Jenna Luo, MD  Multiple Vitamin (MULTIVITAMIN WITH MINERALS) TABS tablet Take 1 tablet by mouth daily. Patient not taking: Reported on 04/03/2016 03/29/16   Ripudeep Jenna Luo, MD  traMADol (ULTRAM) 50 MG tablet Take 1 tablet (50 mg total) by mouth every 6 (six) hours as needed for severe pain. Patient not taking: Reported on 04/03/2016 03/28/16   Ripudeep Jenna Luo, MD   No Known Allergies Review of Systems  Constitutional: Positive for fatigue.  Gastrointestinal: Positive for abdominal pain.  Neurological: Positive for weakness.    Physical Exam  Constitutional: She appears lethargic. She appears cachectic. She appears ill.  Cardiovascular: Tachycardia present.   Neurological: She appears lethargic.    Vital Signs: BP 113/75 (BP Location: Left Arm)   Pulse (!) 120   Temp 99.5 F (37.5 C) (Oral)   Resp (!) 32   Ht 5\' 5"  (1.651 m)   Wt 44.7 kg (98 lb 8.7 oz)   SpO2 100%   BMI 16.40 kg/m  Pain Assessment: 0-10   Pain Score: 10-Worst pain ever   SpO2: SpO2: 100 % O2 Device:SpO2: 100 % O2 Flow Rate: .O2 Flow Rate (L/min): 2 L/min  IO: Intake/output summary:  Intake/Output Summary (Last 24 hours) at 04/03/16 1006 Last data filed at 04/03/16 0659  Gross per 24 hour  Intake            277.5 ml  Output              400 ml  Net           -122.5 ml    LBM:   Baseline Weight: Weight: 44.7 kg (98 lb 8.7 oz) Most recent weight: Weight: 44.7 kg (98 lb 8.7 oz)      Palliative Assessment/Data: 40% at best   Discussed with Dr Susie Cassette   Time In: 1020 Time Out: 1135 Time Total: 75 min Greater than 50%  of this time was spent counseling and coordinating care related to the above assessment and plan.  Signed by: Lorinda CreedLARACH, Carlson Belland, NP   Please contact Palliative Medicine Team phone at [phone] for  questions and concerns.  For individual provider: See Loretha StaplerAmion

## 2016-04-03 NOTE — ED Notes (Signed)
Pt refused inn and out cath

## 2016-04-03 NOTE — Progress Notes (Signed)
Pharmacy Antibiotic Note  Catherine Hernandez is a 31 y.o. female admitted on 04/02/2016 with pneumonia.  Pharmacy has been consulted for vancomycin dosing.  Plan: Vancomycin 1000mg  given in ED then 500mg  IV every 12 hours.  Goal trough 15-20 mcg/mL.   Temp (24hrs), Avg:98.9 F (37.2 C), Min:98.9 F (37.2 C), Max:98.9 F (37.2 C)   Recent Labs Lab 03/28/16 0612 04/03/16 0021 04/03/16 0037  WBC 12.4* 14.9*  --   CREATININE 0.69 0.77  --   LATICACIDVEN  --   --  1.65    Estimated Creatinine Clearance: 71.9 mL/min (by C-G formula based on SCr of 0.77 mg/dL).    No Known Allergies   Thank you for allowing pharmacy to be a part of this patient's care.  Vernard GamblesVeronda Kya Mayfield, PharmD, BCPS  04/03/2016 2:29 AM

## 2016-04-03 NOTE — Progress Notes (Signed)
Patient seen and examined   31 y.o. female with medical history significant of polysubstance abuse (heroin, opioid-dependence, cocaine, tobacco), asthma, anxiety, anemia, serratia bacteremia, multifocal pneumonia with cavitation, septic emboli, endocarditis, PE on Eliquis, who presents with cough and SOB. Been not to be a surgical candidate by cardiothoracic surgery. Presents with  productive cough of clear/yellow phlegm and states her lungs are sore from coughing.  Assessment and plan Sepsis due to HCAP, with hx of serratia bacteremia, multifocal pneumonia with cavitation and septic emboli: pt is septic with leukocytosis, tachycardia and tachypnea. Lactate is normal. Hemodynamically stable. Per previous discharge summary, "pt is not a candidate for home PICC line. 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 CT surgery was consulted. Patient was seen by [name] who felt that patient would not survive surgery and should be treated with antibiotics with emphasis on comfort care."  Pt was discharged on Cipro for one month per ID, Dr. Daiva EvesVan Dam. Continue stepdown - IV Vancomycin and cefepime were started in ED, will continue - Mucinex for cough  - prn Xopenex Neb for SOB - Urine legionella and S. pneumococcal antigen - Follow up blood culture x2, sputum culture and respiratory virus panel, plus Flu pcr Initiate sepsis protocol-Pro calcitonin 0.83 - tylenol for fever Reconsult palliative care-ongoing polysubstance abuse- maybe illegible for hospice at home with ongoing illicit drug use  Bilateral Pulmonary embolism:  -continue Eliquis  Polysubstance abuse: heroin, opioid-dependence, cocaine, tobacco -counseled strongly. -Ativan for withdraw. Currently no symptoms for withdraw -nicotine patch   Anemia of chronic disease and iron deficiency anemia: hgb stable. 9.2 on  03/28/16-->9.3 today. -continue iron supplement  Severe Protein calorie malnutrition  - Nutrition consult  DVT ppx: On eliquis

## 2016-04-04 DIAGNOSIS — F1990 Other psychoactive substance use, unspecified, uncomplicated: Secondary | ICD-10-CM | POA: Diagnosis present

## 2016-04-04 DIAGNOSIS — J189 Pneumonia, unspecified organism: Secondary | ICD-10-CM

## 2016-04-04 DIAGNOSIS — A498 Other bacterial infections of unspecified site: Secondary | ICD-10-CM | POA: Diagnosis not present

## 2016-04-04 DIAGNOSIS — Z8679 Personal history of other diseases of the circulatory system: Secondary | ICD-10-CM

## 2016-04-04 DIAGNOSIS — F1121 Opioid dependence, in remission: Secondary | ICD-10-CM

## 2016-04-04 DIAGNOSIS — A419 Sepsis, unspecified organism: Secondary | ICD-10-CM

## 2016-04-04 DIAGNOSIS — F1410 Cocaine abuse, uncomplicated: Secondary | ICD-10-CM

## 2016-04-04 LAB — EXPECTORATED SPUTUM ASSESSMENT W REFEX TO RESP CULTURE

## 2016-04-04 LAB — URINE CULTURE: Culture: <10000 — AB

## 2016-04-04 LAB — CBC
HEMATOCRIT: 23.4 % — AB (ref 36.0–46.0)
Hemoglobin: 7.1 g/dL — ABNORMAL LOW (ref 12.0–15.0)
MCH: 25.1 pg — AB (ref 26.0–34.0)
MCHC: 30.3 g/dL (ref 30.0–36.0)
MCV: 82.7 fL (ref 78.0–100.0)
Platelets: 205 10*3/uL (ref 150–400)
RBC: 2.83 MIL/uL — ABNORMAL LOW (ref 3.87–5.11)
RDW: 20.5 % — AB (ref 11.5–15.5)
WBC: 9.7 10*3/uL (ref 4.0–10.5)

## 2016-04-04 LAB — COMPREHENSIVE METABOLIC PANEL
ALT: 13 U/L — ABNORMAL LOW (ref 14–54)
AST: 16 U/L (ref 15–41)
Albumin: 1.7 g/dL — ABNORMAL LOW (ref 3.5–5.0)
Alkaline Phosphatase: 71 U/L (ref 38–126)
Anion gap: 6 (ref 5–15)
BILIRUBIN TOTAL: 0.4 mg/dL (ref 0.3–1.2)
CO2: 23 mmol/L (ref 22–32)
Calcium: 7.8 mg/dL — ABNORMAL LOW (ref 8.9–10.3)
Chloride: 105 mmol/L (ref 101–111)
Creatinine, Ser: 0.71 mg/dL (ref 0.44–1.00)
Glucose, Bld: 114 mg/dL — ABNORMAL HIGH (ref 65–99)
POTASSIUM: 3.9 mmol/L (ref 3.5–5.1)
Sodium: 134 mmol/L — ABNORMAL LOW (ref 135–145)
TOTAL PROTEIN: 6.6 g/dL (ref 6.5–8.1)

## 2016-04-04 LAB — EXPECTORATED SPUTUM ASSESSMENT W GRAM STAIN, RFLX TO RESP C

## 2016-04-04 LAB — LEGIONELLA PNEUMOPHILA SEROGP 1 UR AG: L. pneumophila Serogp 1 Ur Ag: NEGATIVE

## 2016-04-04 LAB — PREPARE RBC (CROSSMATCH)

## 2016-04-04 MED ORDER — ENSURE ENLIVE PO LIQD: 237.0000 mL | Freq: Two times a day (BID) | ORAL | Status: DC

## 2016-04-04 MED ORDER — SODIUM CHLORIDE 0.9 % IV SOLN: Freq: Once | INTRAVENOUS | Status: AC

## 2016-04-04 MED ORDER — SODIUM CHLORIDE 0.9 % IV BOLUS (SEPSIS)
500.0000 mL | Freq: Once | INTRAVENOUS | Status: AC
  Administered 2016-04-04: 500 mL via INTRAVENOUS

## 2016-04-04 MED ORDER — METHYLPREDNISOLONE SODIUM SUCC 125 MG IJ SOLR
60.0000 mg | INTRAMUSCULAR | Status: DC
  Administered 2016-04-04: 60 mg via INTRAVENOUS
  Filled 2016-04-04: qty 2

## 2016-04-04 MED ORDER — LEVALBUTEROL HCL 1.25 MG/0.5ML IN NEBU: 1.2500 mg | INHALATION_SOLUTION | Freq: Four times a day (QID) | RESPIRATORY_TRACT | Status: DC

## 2016-04-04 MED ORDER — LORAZEPAM 2 MG/ML IJ SOLN
1.0000 mg | Freq: Once | INTRAMUSCULAR | Status: AC
  Administered 2016-04-04: 1 mg via INTRAVENOUS
  Filled 2016-04-04: qty 1

## 2016-04-04 MED ORDER — SODIUM CHLORIDE 3 % IN NEBU
4.0000 mL | INHALATION_SOLUTION | Freq: Once | RESPIRATORY_TRACT | Status: AC
  Administered 2016-04-04: 4 mL via RESPIRATORY_TRACT
  Filled 2016-04-04: qty 4

## 2016-04-04 MED ORDER — LEVALBUTEROL HCL 1.25 MG/0.5ML IN NEBU
1.2500 mg | INHALATION_SOLUTION | Freq: Four times a day (QID) | RESPIRATORY_TRACT | Status: DC
  Administered 2016-04-05 – 2016-04-06 (×3): 1.25 mg via RESPIRATORY_TRACT
  Filled 2016-04-04 (×5): qty 0.5

## 2016-04-04 MED ORDER — PROMETHAZINE HCL 25 MG/ML IJ SOLN
12.5000 mg | Freq: Four times a day (QID) | INTRAMUSCULAR | Status: DC | PRN
  Administered 2016-04-06: 12.5 mg via INTRAVENOUS
  Filled 2016-04-04: qty 1

## 2016-04-04 MED ORDER — LEVALBUTEROL HCL 1.25 MG/0.5ML IN NEBU
1.2500 mg | INHALATION_SOLUTION | Freq: Four times a day (QID) | RESPIRATORY_TRACT | Status: DC | PRN
  Administered 2016-04-04: 1.25 mg via RESPIRATORY_TRACT
  Filled 2016-04-04: qty 0.5

## 2016-04-04 NOTE — Consult Note (Signed)
[facility]    Date of Admission:  04/02/2016   Total days of antibiotics 15        Day 2 vancomycin        Day 2 cefepime              Reason for Consult: Worsening pneumonia in setting of persistent tricuspid valve endocarditis    Referring Physician: Dr. Carolyne Littlesurtis Woods  Principal Problem:   Endocarditis of tricuspid valve Active Problems:   Sepsis (HCC)   HCAP (healthcare-associated pneumonia)   Enterococcal bacteremia   Staphylococcus aureus bacteremia   Polysubstance abuse   Serratia infection   IVDU (intravenous drug user)   Opioid use disorder, severe, dependence (HCC)   Normocytic anemia   Poor dentition   Protein-calorie malnutrition, severe (HCC)   Symptomatic anemia   Septic embolism (HCC)   Tobacco abuse   . apixaban  5 mg Oral BID  . ceFEPime (MAXIPIME) IV  1 g Intravenous Q8H  . dextromethorphan-guaiFENesin  1 tablet Oral BID  . ferrous gluconate  324 mg Oral TID WC  . folic acid  1 mg Oral Daily  . LORazepam  0-4 mg Intravenous Q6H   Followed by  . [START ON 04/05/2016] LORazepam  0-4 mg Intravenous Q12H  . methylPREDNISolone (SOLU-MEDROL) injection  60 mg Intravenous Q24H  . multivitamin with minerals  1 tablet Oral Daily  . nicotine  21 mg Transdermal Daily  . thiamine  100 mg Oral Daily   Or  . thiamine  100 mg Intravenous Daily  . vancomycin  500 mg Intravenous Q12H    Recommendations: 1. Continue vancomycin and cefepime pending final blood and sputum culture results   Assessment: She has worsening pneumonia evidenced by exam and chest x-ray in the setting of recurrent, polymicrobial bacteremias and tricuspid valve endocarditis. I suspect that she is having recurrent septic pulmonary emboli. She has her ciprofloxacin with her and it appears she has been taking it correctly. She has had in her pillbox. I agree with empiric vancomycin and cefepime pending repeat culture results.    HPI: Catherine Dittyngela N Hernandez is a 31  y.o. female who is well known to our service. She has a history of injecting drug use and tricuspid valve endocarditis. Her history includes:  August 2017: Admission with MSSA and enterococcal bacteremia complicated by tricuspid valve endocarditis and septic pulmonary emboli. She was transferred to a skilled nursing facility for IV ampicillin sulbactam and ceftriaxone but left AMA September 2017: She was readmitted. Blood cultures grew enterococcus. She left AMA again after several days. December 2017: She is seen in the emergency room where blood cultures grew Serratia leading to readmission. TTE revealed a 1 cm mobile vegetation on the anterior leaflet of the tricuspid valve with severe tricuspid regurgitation. She was discharged on 03/28/2016 on oral ciprofloxacin and states that she has been taking it consistently since that time.  She was readmitted yesterday with increasing chest pain, shortness of breath, fever and productive cough. She states she has been snorting some cocaine recently but denies any injecting drug use since prior to her initial hospitalization last August. She has been living with her boyfriend.   Review of Systems: Review of Systems  Constitutional: Positive for chills, fever, malaise/fatigue and weight loss. Negative for diaphoresis.  HENT: Negative for sore throat.   Respiratory: Positive for cough, sputum production and shortness of breath. Negative for hemoptysis and wheezing.   Cardiovascular: Positive  for chest pain.  Gastrointestinal: Negative for abdominal pain, diarrhea, heartburn, nausea and vomiting.  Genitourinary: Negative for dysuria and frequency.  Musculoskeletal: Negative for joint pain and myalgias.  Skin: Negative for rash.  Neurological: Negative for dizziness and headaches.  Psychiatric/Behavioral: Positive for substance abuse.    Past Medical History:  Diagnosis Date  . Anemia   . CAP (community acquired pneumonia) 11/16/2015  . Childhood  asthma   . Enterococcal bacteremia   . Heroin abuse   . Hyponatremia   . Pneumonia 2007  . Polysubstance abuse     Social History  Substance Use Topics  . Smoking status: Current Every Day Smoker    Packs/day: 0.50    Years: 10.00    Types: Cigarettes  . Smokeless tobacco: Never Used  . Alcohol use No    Family History  Problem Relation Age of Onset  . Anesthesia problems Neg Hx   . Hypotension Neg Hx   . Malignant hyperthermia Neg Hx   . Pseudochol deficiency Neg Hx    No Known Allergies  OBJECTIVE: Blood pressure (!) 88/53, pulse (!) 124, temperature 99.8 F (37.7 C), temperature source Oral, resp. rate (!) 40, height 5\' 5"  (1.651 m), weight 98 lb 8.7 oz (44.7 kg), SpO2 100 %.  Physical Exam  Constitutional: She is oriented to person, place, and time.  She is alert and in no distress. She is cachectic. She is requesting more pain medication.  HENT:  Mouth/Throat: No oropharyngeal exudate.  Poor dentition.  Eyes: Conjunctivae are normal.  Cardiovascular: Regular rhythm.   No murmur heard. Tachycardic.  Pulmonary/Chest: Effort normal. She has no wheezes. She has rales.  Abdominal: Soft. She exhibits no mass. There is no tenderness.  Musculoskeletal: Normal range of motion. She exhibits no edema or tenderness.  Neurological: She is alert and oriented to person, place, and time.  Skin: No rash noted.  Psychiatric:  Flat affect.    Lab Results Lab Results  Component Value Date   WBC 9.7 04/04/2016   HGB 7.1 (L) 04/04/2016   HCT 23.4 (L) 04/04/2016   MCV 82.7 04/04/2016   PLT 205 04/04/2016    Lab Results  Component Value Date   CREATININE 0.71 04/04/2016   BUN <5 (L) 04/04/2016   NA 134 (L) 04/04/2016   K 3.9 04/04/2016   CL 105 04/04/2016   CO2 23 04/04/2016    Lab Results  Component Value Date   ALT 13 (L) 04/04/2016   AST 16 04/04/2016   ALKPHOS 71 04/04/2016   BILITOT 0.4 04/04/2016     Microbiology: Recent Results (from the past 240  hour(s))  Urine culture     Status: Abnormal   Collection Time: 04/03/16  1:34 AM  Result Value Ref Range Status   Specimen Description URINE, CLEAN CATCH  Final   Special Requests NONE  Final   Culture <10,000 COLONIES/mL INSIGNIFICANT GROWTH (A)  Final   Report Status 04/04/2016 FINAL  Final  Respiratory Panel by PCR     Status: None   Collection Time: 04/03/16  4:06 AM  Result Value Ref Range Status   Adenovirus NOT DETECTED NOT DETECTED Final   Coronavirus 229E NOT DETECTED NOT DETECTED Final   Coronavirus HKU1 NOT DETECTED NOT DETECTED Final   Coronavirus NL63 NOT DETECTED NOT DETECTED Final   Coronavirus OC43 NOT DETECTED NOT DETECTED Final   Metapneumovirus NOT DETECTED NOT DETECTED Final   Rhinovirus / Enterovirus NOT DETECTED NOT DETECTED Final   Influenza A NOT  DETECTED NOT DETECTED Final   Influenza B NOT DETECTED NOT DETECTED Final   Parainfluenza Virus 1 NOT DETECTED NOT DETECTED Final   Parainfluenza Virus 2 NOT DETECTED NOT DETECTED Final   Parainfluenza Virus 3 NOT DETECTED NOT DETECTED Final   Parainfluenza Virus 4 NOT DETECTED NOT DETECTED Final   Respiratory Syncytial Virus NOT DETECTED NOT DETECTED Final   Bordetella pertussis NOT DETECTED NOT DETECTED Final   Chlamydophila pneumoniae NOT DETECTED NOT DETECTED Final   Mycoplasma pneumoniae NOT DETECTED NOT DETECTED Final  MRSA PCR Screening     Status: None   Collection Time: 04/03/16  4:07 AM  Result Value Ref Range Status   MRSA by PCR NEGATIVE NEGATIVE Final    Comment:        The GeneXpert MRSA Assay (FDA approved for NASAL specimens only), is one component of a comprehensive MRSA colonization surveillance program. It is not intended to diagnose MRSA infection nor to guide or monitor treatment for MRSA infections.   Blood Culture (routine x 2)     Status: None (Preliminary result)   Collection Time: 04/03/16  5:18 AM  Result Value Ref Range Status   Specimen Description BLOOD LEFT ANTECUBITAL   Final   Special Requests BOTTLES DRAWN AEROBIC AND ANAEROBIC 4CC  Final   Culture PENDING  Incomplete   Report Status PENDING  Incomplete    Cliffton Asters, MD [facility] [phone] pager   [phone] cell 04/04/2016, 1:21 PM

## 2016-04-04 NOTE — Progress Notes (Signed)
Pt had a temperature and her Heart Rate has been substaining in the 130's, call doctor and she prescribed  a 1000L bolus. Pt heart rate still in 130's. Page doctor again, waiting on resolution.

## 2016-04-04 NOTE — Progress Notes (Signed)
PROGRESS NOTE    Catherine Hernandez  ZOX:[id number] DOB: 10-14-1985 DOA: 04/02/2016 PCP: No PCP Per Patient   Brief Narrative:   31 y.o. WF PMHX polysubstance abuse (heroin, opioid-dependence, cocaine, tobacco), asthma, Anxiety,Anemia, Serratia Bacteremia, Multifocal Pneumonia with cavitation, Septic Emboli, Endocarditis, PE on Eliquis, who presents with cough and SOB.  Pt was recently hospitalized from 12/17 to 12/26 due to sepsis secondary to serratia bacteremia, multifocal pneumonia with cavitation, septic emboli, endocarditis and PE. Pt was discharged on Cipro and Eliquis for PE. Today she comes back due to worsening SOB, chest pain and cough. She coughs up yellowish sputum. She states that she has frontal chest pain, which is constant,10/10, pleuritic, nonradiating. She denies fever or chills. She states that she has been compliant to Eliquis. Denies runny nose or sore throat. She does not have nausea, vomiting, abdominal pain, diarrhea, symptoms of UTI or unilateral weakness.    Subjective: 1/2 patient lethargic but arousable A/O 4 when awake. As soon as she is weight patient demands IV narcotics and benzodiazepine. Will then not of in the middle of conversation.     Assessment & Plan:   Principal Problem:   Sepsis (HCC) Active Problems:   Opioid use disorder, severe, dependence (HCC)   Protein-calorie malnutrition, severe (HCC)   Tricuspid valve vegetation   Heroin abuse   HCAP (healthcare-associated pneumonia)   Iron deficiency anemia   Septic embolism (HCC)   Tobacco abuse  Sepsis due to HCAP, with hx of serratia bacteremia, multifocal pneumonia with cavitation and septic emboli:  -pt is septic with leukocytosis, tachycardia and tachypnea. Lactate is normal. Hemodynamically stable. Per previous discharge summary, "pt is not a candidate for home PICC line. Concern for endocarditis with large 1 cm mobile mass noted on the anterior tricuspid leaflet with severe tricuspid  regurgitation concerning for vegetation. CT surgery was consulted. Patient was seen by Dr. Morton Peters who felt that patient would not survive heartfelt surgery and should be treated with antibiotics with emphasis on comfort care."  Pt was discharged on Cipro for one month per ID, Dr. Daiva Eves. -Continue current antibiotic -- Mucinex for cough  - Xopenex Neb QID  -Cultures negative to date  -Lactic acid normalized -consult to palliative medicine pending -Titrate O2 to maintain SPO2> 3% -Flutter valve -Solu-Medrol 60 mg daily  TV vegetation/severe regurgitation -On last admission cardiothoracic surgery state patient not a candidate for surgery.  Pulmonary hypertension -Conservative treatment  Bilateral Pulmonary embolism:  -continue Eliquis  Polysubstance abuse: -1/1 UDS positive for cocaine, benzodiazepine, marijuana -counseled strongly. -Ativan for withdraw. Currently no symptoms for withdraw -nicotine patch   Drug-seeking behavior -As soon as patient wakes up requests IV narcotics and benzodiazepine. DO NOT PROVIDE NARCOTICS. See withdraw of cocaine/benzodiazepine  Cocaine/Benzodiazepine withdrawal -CIWA protocol -Promethazine IV PRN N/V  Anemia of chronic disease and iron deficiency anemia:  hgb stable. 9.2 on 03/28/16-->9.3 today. -continue iron supplement -Transfuse for hemoglobin <7 -1/2 transfuse 1 unit PRBC  Severe Protein calorie malnutrition  - Nutrition consult        DVT prophylaxis: Eliquis Code Status: Full Family Communication: None Disposition Plan: ??   Consultants:  Palliative care pending    Procedures/Significant Events:  4/19 Echocardiogram: LVEF=: 65% to 70%. - Tricuspid valve: Large 1 cm mobile vegetation on the anterior leaflet of the tricuspid valve with severe tricuspid regurgitation, consistent with vegetation. Severe  regurgitation. - - Pulmonary arteries: PA peak pressure: 41 mm Hg (S). - Inferior vena cava: The vessel was  dilated.  12/20 CT Angiogram chest:--Bilateral lower lobe pulmonary emboli with tapered appearance, this can be seen with chronic pulmonary emboli or possibly vasculopathy secondary to septic emboli. --Multiple cavitating and non cavitating pneumonia/consolidation of varying ages consistent with septic emboli. --Mild cardiomegaly and dilated RIGHT atrium. --Suspected splenomegaly. 1/2 transfuse 1 unit PRBC    VENTILATOR SETTINGS: None   Cultures 12/19 Hepatitis C antibody positive 12/19 HIV negative 1/1 blood pending 1/1 urine insignificant growth 1/1 MRSA by PCR negative 1/1 strep pneumo/Legionella urine antigen negative 1/2 induction sputum pending 1/2 Hepatitis C RNA pending    Antimicrobials: Anti-infectives    Start     Stop   04/04/16 1000  azithromycin (ZITHROMAX) tablet 250 mg  Status:  Discontinued     04/03/16 0836   04/03/16 1600  vancomycin (VANCOCIN) 500 mg in sodium chloride 0.9 % 100 mL IVPB         04/03/16 1000  azithromycin (ZITHROMAX) tablet 500 mg  Status:  Discontinued     04/03/16 0836   04/03/16 0800  ceFEPIme (MAXIPIME) 1 g in dextrose 5 % 50 mL IVPB     04/11/16 0759   04/03/16 0100  vancomycin (VANCOCIN) IVPB 1000 mg/200 mL premix     04/03/16 0318   04/03/16 0100  ceFEPIme (MAXIPIME) 2 g in dextrose 5 % 50 mL IVPB             Devices NA   LINES / TUBES:  NA    Continuous Infusions: . sodium chloride 75 mL/hr at 04/03/16 1556     Objective: Vitals:   04/03/16 1939 04/03/16 2054 04/03/16 2330 04/04/16 0807  BP: 105/65  (!) 100/57 102/62  Pulse: (!) 139  (!) 117 (!) 126  Resp: (!) 28  (!) 36 (!) 35  Temp: (!) 102.5 F (39.2 C) (!) 101 F (38.3 C) 98.3 F (36.8 C) (!) 100.4 F (38 C)  TempSrc: Oral Oral Oral Oral  SpO2: 97%  100% 97%  Weight:      Height:        Intake/Output Summary (Last 24 hours) at 04/04/16 0843 Last data filed at 04/04/16 0759  Gross per 24 hour  Intake          2316.25 ml  Output               400 ml  Net          1916.25 ml   Filed Weights   04/03/16 0727  Weight: 44.7 kg (98 lb 8.7 oz)    Examination:  General: A/O 4, cachectic, positive acute respiratory distress Eyes: negative scleral hemorrhage, negative anisocoria, negative icterus ENT: Negative Runny nose, negative gingival bleeding, Neck:  Negative scars, masses, torticollis, lymphadenopathy, JVD Lungs: poor air movement in all lung fields, positive expiratory wheezing, positive by basilar crackles.  Cardiovascular: Tachycardic, Regular rhythm, positive grade 4/6 systolic murmur, negative gallop or rub normal S1 and S2 Abdomen: negative abdominal pain, nondistended, positive soft, bowel sounds, no rebound, no ascites, no appreciable mass Extremities: No significant cyanosis, clubbing, or edema bilateral lower extremities Skin: Negative rashes, lesions, ulcers, multiple tattoos Psychiatric:  Negative depression, negative anxiety, negative fatigue, negative mania  Central nervous system:  Cranial nerves II through XII intact, tongue/uvula midline, all extremities muscle strength 5/5, sensation intact throughout,  negative dysarthria, negative expressive aphasia, negative receptive aphasia.  .     Data Reviewed: Care during the described time interval was provided by me .  I have reviewed this patient's available data,  including medical history, events of note, physical examination, and all test results as part of my evaluation. I have personally reviewed and interpreted all radiology studies.  CBC:  Recent Labs Lab 04/03/16 0021 04/04/16 0530  WBC 14.9* 9.7  NEUTROABS 10.1*  --   HGB 9.3* 7.1*  HCT 29.9* 23.4*  MCV 80.6 82.7  PLT 336 205   Basic Metabolic Panel:  Recent Labs Lab 04/03/16 0021 04/04/16 0530  NA 135 134*  K 3.6 3.9  CL 98* 105  CO2 25 23  GLUCOSE 111* 114*  BUN 14 <5*  CREATININE 0.77 0.71  CALCIUM 8.6* 7.8*   GFR: Estimated Creatinine Clearance: 73.9 mL/min (by C-G formula  based on SCr of 0.71 mg/dL). Liver Function Tests:  Recent Labs Lab 04/03/16 0021 04/04/16 0530  AST 22 16  ALT 20 13*  ALKPHOS 111 71  BILITOT 0.5 0.4  PROT 8.7* 6.6  ALBUMIN 2.3* 1.7*   No results for input(s): LIPASE, AMYLASE in the last 168 hours. No results for input(s): AMMONIA in the last 168 hours. Coagulation Profile:  Recent Labs Lab 04/03/16 0518  INR 1.25   Cardiac Enzymes: No results for input(s): CKTOTAL, CKMB, CKMBINDEX, TROPONINI in the last 168 hours. BNP (last 3 results) No results for input(s): PROBNP in the last 8760 hours. HbA1C: No results for input(s): HGBA1C in the last 72 hours. CBG: No results for input(s): GLUCAP in the last 168 hours. Lipid Profile: No results for input(s): CHOL, HDL, LDLCALC, TRIG, CHOLHDL, LDLDIRECT in the last 72 hours. Thyroid Function Tests: No results for input(s): TSH, T4TOTAL, FREET4, T3FREE, THYROIDAB in the last 72 hours. Anemia Panel: No results for input(s): VITAMINB12, FOLATE, FERRITIN, TIBC, IRON, RETICCTPCT in the last 72 hours. Urine analysis:    Component Value Date/Time   COLORURINE YELLOW 04/03/2016 0134   APPEARANCEUR HAZY (A) 04/03/2016 0134   LABSPEC 1.015 04/03/2016 0134   PHURINE 6.0 04/03/2016 0134   GLUCOSEU NEGATIVE 04/03/2016 0134   HGBUR LARGE (A) 04/03/2016 0134   BILIRUBINUR NEGATIVE 04/03/2016 0134   KETONESUR NEGATIVE 04/03/2016 0134   PROTEINUR 30 (A) 04/03/2016 0134   UROBILINOGEN 0.2 01/07/2012 1140   NITRITE NEGATIVE 04/03/2016 0134   LEUKOCYTESUR NEGATIVE 04/03/2016 0134   Sepsis Labs: @LABRCNTIP (procalcitonin:4,lacticidven:4)  ) Recent Results (from the past 240 hour(s))  Urine culture     Status: Abnormal   Collection Time: 04/03/16  1:34 AM  Result Value Ref Range Status   Specimen Description URINE, CLEAN CATCH  Final   Special Requests NONE  Final   Culture <10,000 COLONIES/mL INSIGNIFICANT GROWTH (A)  Final   Report Status 04/04/2016 FINAL  Final  Respiratory Panel  by PCR     Status: None   Collection Time: 04/03/16  4:06 AM  Result Value Ref Range Status   Adenovirus NOT DETECTED NOT DETECTED Final   Coronavirus 229E NOT DETECTED NOT DETECTED Final   Coronavirus HKU1 NOT DETECTED NOT DETECTED Final   Coronavirus NL63 NOT DETECTED NOT DETECTED Final   Coronavirus OC43 NOT DETECTED NOT DETECTED Final   Metapneumovirus NOT DETECTED NOT DETECTED Final   Rhinovirus / Enterovirus NOT DETECTED NOT DETECTED Final   Influenza A NOT DETECTED NOT DETECTED Final   Influenza B NOT DETECTED NOT DETECTED Final   Parainfluenza Virus 1 NOT DETECTED NOT DETECTED Final   Parainfluenza Virus 2 NOT DETECTED NOT DETECTED Final   Parainfluenza Virus 3 NOT DETECTED NOT DETECTED Final   Parainfluenza Virus 4 NOT DETECTED NOT DETECTED Final  Respiratory Syncytial Virus NOT DETECTED NOT DETECTED Final   Bordetella pertussis NOT DETECTED NOT DETECTED Final   Chlamydophila pneumoniae NOT DETECTED NOT DETECTED Final   Mycoplasma pneumoniae NOT DETECTED NOT DETECTED Final  MRSA PCR Screening     Status: None   Collection Time: 04/03/16  4:07 AM  Result Value Ref Range Status   MRSA by PCR NEGATIVE NEGATIVE Final    Comment:        The GeneXpert MRSA Assay (FDA approved for NASAL specimens only), is one component of a comprehensive MRSA colonization surveillance program. It is not intended to diagnose MRSA infection nor to guide or monitor treatment for MRSA infections.   Blood Culture (routine x 2)     Status: None (Preliminary result)   Collection Time: 04/03/16  5:18 AM  Result Value Ref Range Status   Specimen Description BLOOD LEFT ANTECUBITAL  Final   Special Requests BOTTLES DRAWN AEROBIC AND ANAEROBIC 4CC  Final   Culture PENDING  Incomplete   Report Status PENDING  Incomplete         Radiology Studies: Dg Chest 2 View  Result Date: 04/03/2016 CLINICAL DATA:  Chest pain, cough for 2 days. History of pulmonary emboli, polysubstance abuse, pneumonia.  EXAM: CHEST  2 VIEW COMPARISON:  CT chest March 22, 2016 FINDINGS: Cardiac silhouette is mildly enlarged unchanged. Dense consolidation LEFT mid and lower lung zone with small LEFT pleural effusion. Patchy consolidation RIGHT lung. No pneumothorax. Soft tissue planes and included osseous structures are nonsuspicious. IMPRESSION: Probable worsening of multifocal pneumonia in a background of known septic emboli. Stable mild cardiomegaly. Electronically Signed   By: Awilda Metroourtnay  Bloomer M.D.   On: 04/03/2016 01:10        Scheduled Meds: . apixaban  5 mg Oral BID  . ceFEPime (MAXIPIME) IV  1 g Intravenous Q8H  . dextromethorphan-guaiFENesin  1 tablet Oral BID  . ferrous gluconate  324 mg Oral TID WC  . folic acid  1 mg Oral Daily  . LORazepam  0-4 mg Intravenous Q6H   Followed by  . [START ON 04/05/2016] LORazepam  0-4 mg Intravenous Q12H  . multivitamin with minerals  1 tablet Oral Daily  . nicotine  21 mg Transdermal Daily  . thiamine  100 mg Oral Daily   Or  . thiamine  100 mg Intravenous Daily  . vancomycin  500 mg Intravenous Q12H   Continuous Infusions: . sodium chloride 75 mL/hr at 04/03/16 1556     LOS: 1 day    Time spent: 40 minutes    WOODS, Roselind MessierURTIS J, MD [facility] Pager [phone]   If 7PM-7AM, please contact night-coverage [email or web address] Password [email or web address] 04/04/2016, 8:43 AM

## 2016-04-04 NOTE — Progress Notes (Addendum)
Initial Nutrition Assessment  DOCUMENTATION CODES:   Severe malnutrition in context of chronic illness, Underweight  INTERVENTION:    Ensure Enlive PO BID, each supplement provides 350 kcal and 20 grams of protein  NUTRITION DIAGNOSIS:   Malnutrition related to chronic illness as evidenced by severe depletion of muscle mass, severe depletion of body fat.  GOAL:   Patient will meet greater than or equal to 90% of their needs  MONITOR:   PO intake, Supplement acceptance, Labs, Skin  REASON FOR ASSESSMENT:   Consult Assessment of nutrition requirement/status  ASSESSMENT:    31 y.o. female with PMH of polysubstance abuse (heroin, opioid-dependence, cocaine, tobacco), asthma, anxiety, anemia, serratia bacteremia, multifocal pneumonia with cavitation, septic emboli, endocarditis, PE on Eliquis, who presented with cough and SOB.  Patient familiar from recent admission. She once again reports poor intake.  Palliative Care team following patient. Nutrition-Focused physical exam completed. Findings are severe fat depletion, severe muscle depletion, and no edema. No change from previous admission Patient with severe PCM. Labs reviewed: sodium 134, BUN < 5. Medications reviewed and include folic acid, MVI, thiamine, fergon, solu-medrol.  Diet Order:  Diet regular Room service appropriate? Yes; Fluid consistency: Thin  Skin:  R hip open wound, small scabs all over body, stage I to sacrum, stage II to R hip  Last BM:  12/31  Height:   Ht Readings from Last 1 Encounters:  04/03/16 5\' 5"  (1.651 m)    Weight:   Wt Readings from Last 1 Encounters:  04/03/16 98 lb 8.7 oz (44.7 kg)    Ideal Body Weight:  56.8 kg  BMI:  Body mass index is 16.4 kg/m.  Estimated Nutritional Needs:   Kcal:  1500-1700  Protein:  70-85 gm  Fluid:  >/= 1.5 L  EDUCATION NEEDS:   No education needs identified at this time  Joaquin CourtsKimberly Lalanya Rufener, RD, LDN, CNSC Pager [phone] After Hours Pager  [phone]
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
## 2016-04-04 NOTE — Progress Notes (Signed)
   04/04/16 1500  Clinical Encounter Type  Visited With Patient  Visit Type Initial  Referral From Palliative care team  Consult/Referral To Chaplain  Spiritual Encounters  Spiritual Needs Emotional  Stress Factors  Patient Stress Factors None identified    Chaplain responded to consult. Pt did not wish to talk at this time will attempt to follow up and refer to unit chaplain.

## 2016-04-04 NOTE — Progress Notes (Signed)
Doctor order 500 bolus, patient IV has went bad, IV team came and tried twice, they were going to try again and patient refused stating, "I going to die anyway".  Not able to give bolus and IV team left. Will relay information to day shift nurse for further follow-up.

## 2016-04-05 LAB — TYPE AND SCREEN
ABO/RH(D): O POS
Antibody Screen: NEGATIVE
UNIT DIVISION: 0

## 2016-04-05 LAB — BASIC METABOLIC PANEL
ANION GAP: 12 (ref 5–15)
BUN: 9 mg/dL (ref 6–20)
CALCIUM: 8.5 mg/dL — AB (ref 8.9–10.3)
CO2: 22 mmol/L (ref 22–32)
CREATININE: 0.59 mg/dL (ref 0.44–1.00)
Chloride: 105 mmol/L (ref 101–111)
Glucose, Bld: 143 mg/dL — ABNORMAL HIGH (ref 65–99)
Potassium: 4.3 mmol/L (ref 3.5–5.1)
Sodium: 139 mmol/L (ref 135–145)

## 2016-04-05 LAB — CBC WITH DIFFERENTIAL/PLATELET
BASOS ABS: 0 10*3/uL (ref 0.0–0.1)
BASOS PCT: 0 %
EOS PCT: 0 %
Eosinophils Absolute: 0 10*3/uL (ref 0.0–0.7)
HEMATOCRIT: 29.8 % — AB (ref 36.0–46.0)
Hemoglobin: 9.3 g/dL — ABNORMAL LOW (ref 12.0–15.0)
Lymphocytes Relative: 18 %
Lymphs Abs: 1.1 10*3/uL (ref 0.7–4.0)
MCH: 25.8 pg — ABNORMAL LOW (ref 26.0–34.0)
MCHC: 31.2 g/dL (ref 30.0–36.0)
MCV: 82.5 fL (ref 78.0–100.0)
MONO ABS: 0.4 10*3/uL (ref 0.1–1.0)
MONOS PCT: 7 %
NEUTROS ABS: 4.6 10*3/uL (ref 1.7–7.7)
Neutrophils Relative %: 76 %
PLATELETS: 187 10*3/uL (ref 150–400)
RBC: 3.61 MIL/uL — ABNORMAL LOW (ref 3.87–5.11)
RDW: 19 % — AB (ref 11.5–15.5)
WBC: 6.1 10*3/uL (ref 4.0–10.5)

## 2016-04-05 LAB — HCV RNA QUANT: HCV QUANT: NOT DETECTED [IU]/mL (ref >50)

## 2016-04-05 LAB — MAGNESIUM: Magnesium: 2.1 mg/dL (ref 1.7–2.4)

## 2016-04-05 NOTE — Progress Notes (Signed)
PROGRESS NOTE    LOYE REININGER  WJX:[id number] DOB: 1985/09/12 DOA: 04/02/2016 PCP: No PCP Per Patient   Brief Narrative:   31 y.o. WF PMHX polysubstance abuse (heroin, opioid-dependence, cocaine, tobacco), asthma, Anxiety,Anemia, Serratia Bacteremia, Multifocal Pneumonia with cavitation, Septic Emboli, Endocarditis, PE on Eliquis, who presents with cough and SOB.  Pt was recently hospitalized from 12/17 to 12/26 due to sepsis secondary to serratia bacteremia, multifocal pneumonia with cavitation, septic emboli, endocarditis and PE. Pt was discharged on Cipro and Eliquis for PE. Today she comes back due to worsening SOB, chest pain and cough. She coughs up yellowish sputum. She states that she has frontal chest pain, which is constant,10/10, pleuritic, nonradiating. She denies fever or chills. She states that she has been compliant to Eliquis. Denies runny nose or sore throat. She does not have nausea, vomiting, abdominal pain, diarrhea, symptoms of UTI or unilateral weakness.    Subjective:  Patient in bed, appears to be in no distress, denies any headache, no shortness of breath, cough, says she has chronic abdominal pain. Assessment & Plan:   Principal Problem:   Endocarditis of tricuspid valve Active Problems:   Sepsis (HCC)   Opioid use disorder, severe, dependence (HCC)   Enterococcal bacteremia   Staphylococcus aureus bacteremia   Normocytic anemia   Poor dentition   Protein-calorie malnutrition, severe (HCC)   Polysubstance abuse   Symptomatic anemia   HCAP (healthcare-associated pneumonia)   Septic embolism (HCC)   Tobacco abuse   Serratia infection   IVDU (intravenous drug user)   Sepsis due to pneumonia (HCC)  Sepsis due to HCAP, with hx of serratia bacteremia, multifocal pneumonia with cavitation and septic emboli: History of IV drug abuse, 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 -pt is septic with leukocytosis, tachycardia and tachypnea.  Lactate is normal. Hemodynamically stable. Per previous discharge summary, "pt is not a candidate for home PICC line. 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 CT surgery was consulted. Patient was seen by Dr. Morton Peters who felt that patient would not survive open heart surgery and should be treated with antibiotics with emphasis on comfort care."  Pt was discharged on Cipro for one month per ID, Dr. Daiva Eves.  Currently on IV cefepime and vancomycin, ID following, sepsis physiology has resolved, stop IV fluids and IV steroids. Transfer to telemetry and monitor.  Patient counseled to quit all recreational drug use and abuse.  TV vegetation/severe regurgitation -On last admission cardiothoracic surgery state patient not a candidate for surgery.  Pulmonary hypertension -Conservative treatment  Bilateral Pulmonary embolism:  -continue Eliquis  Polysubstance abuse: -1/1 UDS positive for cocaine, benzodiazepine, marijuana -counseled strongly. -Ativan for withdraw. Currently no symptoms for withdraw -nicotine patch   Drug-seeking behavior -As soon as patient wakes up requests IV narcotics and benzodiazepine. DO NOT PROVIDE NARCOTICS. See withdraw of cocaine/benzodiazepine  Cocaine/Benzodiazepine withdrawal -CIWA protocol -Promethazine IV PRN N/V  Anemia of chronic disease and iron deficiency anemia:  Stable after 1 unit of packed RBC transfusion earlier in admission, on iron supplementation which will be continued  Severe Protein calorie malnutrition  - Nutrition consult   DVT prophylaxis: Eliquis Code Status: Full Family Communication: None Disposition Plan: Tele   Consultants:  ID Palliative care pending    Procedures/Significant Events:  4/19 Echocardiogram: LVEF=: 65% to 70%. - Tricuspid valve: Large 1 cm mobile vegetation on the anterior leaflet of the  tricuspid valve with  severe tricuspid regurgitation, consistent with vegetation. Severe  regurgitation. - - Pulmonary arteries: PA peak pressure: 41 mm Hg (S). - Inferior vena cava: The vessel was dilated.  12/20 CT Angiogram chest:--Bilateral lower lobe pulmonary emboli with tapered appearance, this can be seen with chronic pulmonary emboli or possibly vasculopathy secondary to septic emboli. --Multiple cavitating and non cavitating pneumonia/consolidation of varying ages consistent with septic emboli. --Mild cardiomegaly and dilated RIGHT atrium. --Suspected splenomegaly. 1/2 transfuse 1 unit PRBC    VENTILATOR SETTINGS: None   Cultures 12/19 Hepatitis C antibody positive 12/19 HIV negative 1/1 blood pending 1/1 urine insignificant growth 1/1 MRSA by PCR negative 1/1 strep pneumo/Legionella urine antigen negative 1/2 induction sputum pending 1/2 Hepatitis C RNA pending    Antimicrobials:  Anti-infectives    Start     Dose/Rate Route Frequency Ordered Stop   04/04/16 1000  azithromycin (ZITHROMAX) tablet 250 mg  Status:  Discontinued     250 mg Oral Daily 04/03/16 0223 04/03/16 0836   04/03/16 1600  vancomycin (VANCOCIN) 500 mg in sodium chloride 0.9 % 100 mL IVPB     500 mg 100 mL/hr over 60 Minutes Intravenous Every 12 hours 04/03/16 0230     04/03/16 1000  azithromycin (ZITHROMAX) tablet 500 mg  Status:  Discontinued     500 mg Oral Daily 04/03/16 0223 04/03/16 0836   04/03/16 0800  ceFEPIme (MAXIPIME) 1 g in dextrose 5 % 50 mL IVPB     1 g 100 mL/hr over 30 Minutes Intravenous Every 8 hours 04/03/16 0222 04/11/16 0759   04/03/16 0100  vancomycin (VANCOCIN) IVPB 1000 mg/200 mL premix     1,000 mg 200 mL/hr over 60 Minutes Intravenous  Once 04/03/16 0059 04/03/16 0318   04/03/16 0100  ceFEPIme (MAXIPIME) 2 g in dextrose 5 % 50 mL IVPB     2 g 100 mL/hr over 30 Minutes Intravenous  Once 04/03/16 0059 04/03/16 0218      Devices NA   LINES / TUBES:   NA    Continuous Infusions:    Objective: Vitals:   04/05/16 0153 04/05/16 0530 04/05/16 0630 04/05/16 0755  BP:  (!) 128/96 (!) 127/97 (!) 135/95  Pulse:    61  Resp:  20 (!) 21 (!) 24  Temp:  97.8 F (36.6 C)  97.8 F (36.6 C)  TempSrc:  Oral  Oral  SpO2: 99% 100%  98%  Weight:      Height:        Intake/Output Summary (Last 24 hours) at 04/05/16 1015 Last data filed at 04/05/16 0600  Gross per 24 hour  Intake          3139.25 ml  Output                0 ml  Net          3139.25 ml   Filed Weights   04/03/16 0727  Weight: 44.7 kg (98 lb 8.7 oz)    Examination:  General: A/O 4, cachectic, no acute respiratory distress Eyes: negative scleral hemorrhage, negative anisocoria, negative icterus ENT: Negative Runny nose, negative gingival bleeding, Neck:  Negative scars, masses, torticollis, lymphadenopathy, JVD Lungs: poor air movement in all lung fields, positive expiratory wheezing, positive by basilar crackles.  Cardiovascular: Tachycardic, Regular rhythm, positive grade 4/6 systolic murmur, negative gallop or rub normal S1 and S2 Abdomen: negative abdominal pain, nondistended, positive soft, bowel sounds, no rebound, no ascites, no appreciable mass Extremities: No significant cyanosis, clubbing, or edema  bilateral lower extremities Skin: Negative rashes, lesions, ulcers, multiple tattoos Psychiatric:  Negative depression, negative anxiety, negative fatigue, negative mania  Central nervous system:  Cranial nerves II through XII intact, tongue/uvula midline, all extremities muscle strength 5/5, sensation intact throughout,  negative dysarthria, negative expressive aphasia, negative receptive aphasia.  .     Data Reviewed: Care during the described time interval was provided by me .  I have reviewed this patient's available data, including medical history, events of note, physical examination, and all test results as part of my evaluation. I have personally  reviewed and interpreted all radiology studies.  CBC:  Recent Labs Lab 04/03/16 0021 04/04/16 0530 04/05/16 0745  WBC 14.9* 9.7 6.1  NEUTROABS 10.1*  --  4.6  HGB 9.3* 7.1* 9.3*  HCT 29.9* 23.4* 29.8*  MCV 80.6 82.7 82.5  PLT 336 205 187   Basic Metabolic Panel:  Recent Labs Lab 04/03/16 0021 04/04/16 0530 04/05/16 0745  NA 135 134* 139  K 3.6 3.9 4.3  CL 98* 105 105  CO2 25 23 22   GLUCOSE 111* 114* 143*  BUN 14 <5* 9  CREATININE 0.77 0.71 0.59  CALCIUM 8.6* 7.8* 8.5*  MG  --   --  2.1   GFR: Estimated Creatinine Clearance: 73.9 mL/min (by C-G formula based on SCr of 0.59 mg/dL). Liver Function Tests:  Recent Labs Lab 04/03/16 0021 04/04/16 0530  AST 22 16  ALT 20 13*  ALKPHOS 111 71  BILITOT 0.5 0.4  PROT 8.7* 6.6  ALBUMIN 2.3* 1.7*   No results for input(s): LIPASE, AMYLASE in the last 168 hours. No results for input(s): AMMONIA in the last 168 hours. Coagulation Profile:  Recent Labs Lab 04/03/16 0518  INR 1.25   Cardiac Enzymes: No results for input(s): CKTOTAL, CKMB, CKMBINDEX, TROPONINI in the last 168 hours. BNP (last 3 results) No results for input(s): PROBNP in the last 8760 hours. HbA1C: No results for input(s): HGBA1C in the last 72 hours. CBG: No results for input(s): GLUCAP in the last 168 hours. Lipid Profile: No results for input(s): CHOL, HDL, LDLCALC, TRIG, CHOLHDL, LDLDIRECT in the last 72 hours. Thyroid Function Tests: No results for input(s): TSH, T4TOTAL, FREET4, T3FREE, THYROIDAB in the last 72 hours. Anemia Panel: No results for input(s): VITAMINB12, FOLATE, FERRITIN, TIBC, IRON, RETICCTPCT in the last 72 hours. Urine analysis:    Component Value Date/Time   COLORURINE YELLOW 04/03/2016 0134   APPEARANCEUR HAZY (A) 04/03/2016 0134   LABSPEC 1.015 04/03/2016 0134   PHURINE 6.0 04/03/2016 0134   GLUCOSEU NEGATIVE 04/03/2016 0134   HGBUR LARGE (A) 04/03/2016 0134   BILIRUBINUR NEGATIVE 04/03/2016 0134   KETONESUR  NEGATIVE 04/03/2016 0134   PROTEINUR 30 (A) 04/03/2016 0134   UROBILINOGEN 0.2 01/07/2012 1140   NITRITE NEGATIVE 04/03/2016 0134   LEUKOCYTESUR NEGATIVE 04/03/2016 0134   Sepsis Labs: @LABRCNTIP (procalcitonin:4,lacticidven:4)  ) Recent Results (from the past 240 hour(s))  Blood Culture (routine x 2)     Status: None (Preliminary result)   Collection Time: 04/03/16 12:27 AM  Result Value Ref Range Status   Specimen Description BLOOD RIGHT ANTECUBITAL  Final   Special Requests BOTTLES DRAWN AEROBIC AND ANAEROBIC 5CC  Final   Culture NO GROWTH 1 DAY  Final   Report Status PENDING  Incomplete  Urine culture     Status: Abnormal   Collection Time: 04/03/16  1:34 AM  Result Value Ref Range Status   Specimen Description URINE, CLEAN CATCH  Final   Special Requests  NONE  Final   Culture <10,000 COLONIES/mL INSIGNIFICANT GROWTH (A)  Final   Report Status 04/04/2016 FINAL  Final  Culture, sputum-assessment     Status: None   Collection Time: 04/03/16  2:21 AM  Result Value Ref Range Status   Specimen Description EXPECTORATED SPUTUM  Final   Special Requests NONE  Final   Sputum evaluation THIS SPECIMEN IS ACCEPTABLE FOR SPUTUM CULTURE  Final   Report Status 04/04/2016 FINAL  Final  Culture, respiratory (NON-Expectorated)     Status: None (Preliminary result)   Collection Time: 04/03/16  2:21 AM  Result Value Ref Range Status   Specimen Description EXPECTORATED SPUTUM  Final   Special Requests NONE Reflexed from [id number]  Final   Gram Stain   Final    FEW WBC PRESENT, PREDOMINANTLY PMN FEW BUDDING YEAST SEEN FEW GRAM POSITIVE RODS    Culture PENDING  Incomplete   Report Status PENDING  Incomplete  Respiratory Panel by PCR     Status: None   Collection Time: 04/03/16  4:06 AM  Result Value Ref Range Status   Adenovirus NOT DETECTED NOT DETECTED Final   Coronavirus 229E NOT DETECTED NOT DETECTED Final   Coronavirus HKU1 NOT DETECTED NOT DETECTED Final   Coronavirus NL63 NOT  DETECTED NOT DETECTED Final   Coronavirus OC43 NOT DETECTED NOT DETECTED Final   Metapneumovirus NOT DETECTED NOT DETECTED Final   Rhinovirus / Enterovirus NOT DETECTED NOT DETECTED Final   Influenza A NOT DETECTED NOT DETECTED Final   Influenza B NOT DETECTED NOT DETECTED Final   Parainfluenza Virus 1 NOT DETECTED NOT DETECTED Final   Parainfluenza Virus 2 NOT DETECTED NOT DETECTED Final   Parainfluenza Virus 3 NOT DETECTED NOT DETECTED Final   Parainfluenza Virus 4 NOT DETECTED NOT DETECTED Final   Respiratory Syncytial Virus NOT DETECTED NOT DETECTED Final   Bordetella pertussis NOT DETECTED NOT DETECTED Final   Chlamydophila pneumoniae NOT DETECTED NOT DETECTED Final   Mycoplasma pneumoniae NOT DETECTED NOT DETECTED Final  MRSA PCR Screening     Status: None   Collection Time: 04/03/16  4:07 AM  Result Value Ref Range Status   MRSA by PCR NEGATIVE NEGATIVE Final    Comment:        The GeneXpert MRSA Assay (FDA approved for NASAL specimens only), is one component of a comprehensive MRSA colonization surveillance program. It is not intended to diagnose MRSA infection nor to guide or monitor treatment for MRSA infections.   Blood Culture (routine x 2)     Status: None (Preliminary result)   Collection Time: 04/03/16  5:18 AM  Result Value Ref Range Status   Specimen Description BLOOD LEFT ANTECUBITAL  Final   Special Requests BOTTLES DRAWN AEROBIC AND ANAEROBIC 4CC  Final   Culture PENDING  Incomplete   Report Status PENDING  Incomplete         Radiology Studies: No results found.   Scheduled Meds: . apixaban  5 mg Oral BID  . ceFEPime (MAXIPIME) IV  1 g Intravenous Q8H  . dextromethorphan-guaiFENesin  1 tablet Oral BID  . feeding supplement (ENSURE ENLIVE)  237 mL Oral BID BM  . ferrous gluconate  324 mg Oral TID WC  . folic acid  1 mg Oral Daily  . levalbuterol  1.25 mg Nebulization Q6H  . LORazepam  0-4 mg Intravenous Q6H   Followed by  . LORazepam  0-4 mg  Intravenous Q12H  . methylPREDNISolone (SOLU-MEDROL) injection  60 mg Intravenous Q24H  .  multivitamin with minerals  1 tablet Oral Daily  . nicotine  21 mg Transdermal Daily  . thiamine  100 mg Oral Daily   Or  . thiamine  100 mg Intravenous Daily  . vancomycin  500 mg Intravenous Q12H   Continuous Infusions:    LOS: 2 days    Time spent: 40 minutes    Leroy Sea, MD [facility] Pager [phone]  If 7PM-7AM, please contact night-coverage [email or web address] Password [email or web address] 04/05/2016, 10:15 AM

## 2016-04-05 NOTE — Progress Notes (Signed)
[facility]  Date of Admission:  04/02/2016   Total days of antibiotics 16        Day 3 vancomycin        Day 3 cefepime         Principal Problem:   Endocarditis of tricuspid valve Active Problems:   Sepsis (HCC)   HCAP (healthcare-associated pneumonia)   Enterococcal bacteremia   Staphylococcus aureus bacteremia   Polysubstance abuse   Serratia infection   IVDU (intravenous drug user)   Opioid use disorder, severe, dependence (HCC)   Normocytic anemia   Poor dentition   Protein-calorie malnutrition, severe (HCC)   Symptomatic anemia   Septic embolism (HCC)   Tobacco abuse   Sepsis due to pneumonia (HCC)   . apixaban  5 mg Oral BID  . ceFEPime (MAXIPIME) IV  1 g Intravenous Q8H  . dextromethorphan-guaiFENesin  1 tablet Oral BID  . feeding supplement (ENSURE ENLIVE)  237 mL Oral BID BM  . ferrous gluconate  324 mg Oral TID WC  . folic acid  1 mg Oral Daily  . levalbuterol  1.25 mg Nebulization Q6H  . LORazepam  0-4 mg Intravenous Q6H   Followed by  . LORazepam  0-4 mg Intravenous Q12H  . multivitamin with minerals  1 tablet Oral Daily  . nicotine  21 mg Transdermal Daily  . thiamine  100 mg Oral Daily   Or  . thiamine  100 mg Intravenous Daily  . vancomycin  500 mg Intravenous Q12H    SUBJECTIVE: She still having some chest pain and shortness of breath but is feeling a little bit better.  Review of Systems: Review of Systems  Unable to perform ROS: Mental acuity  Constitutional:       She is very drowsy and repeatedly falls off to sleep and is unable to answer most questions.    Past Medical History:  Diagnosis Date  . Anemia   . CAP (community acquired pneumonia) 11/16/2015  . Childhood asthma   . Enterococcal bacteremia   . Heroin abuse   . Hyponatremia   . Pneumonia 2007  . Polysubstance abuse     Social History  Substance Use Topics  . Smoking status: Current Every Day Smoker    Packs/day: 0.50   Years: 10.00    Types: Cigarettes  . Smokeless tobacco: Never Used  . Alcohol use No    Family History  Problem Relation Age of Onset  . Anesthesia problems Neg Hx   . Hypotension Neg Hx   . Malignant hyperthermia Neg Hx   . Pseudochol deficiency Neg Hx    No Known Allergies  OBJECTIVE: Vitals:   04/05/16 0530 04/05/16 0630 04/05/16 0755 04/05/16 1109  BP: (!) 128/96 (!) 127/97 (!) 135/95 109/78  Pulse:   61 91  Resp: 20 (!) 21 (!) 24 20  Temp: 97.8 F (36.6 C)  97.8 F (36.6 C) 97.3 F (36.3 C)  TempSrc: Oral  Oral Oral  SpO2: 100%  98% 98%  Weight:      Height:       Body mass index is 16.4 kg/m.  Physical Exam  Constitutional: No distress.  Cardiovascular: Normal rate and regular rhythm.   No murmur heard. Pulmonary/Chest: Effort normal and breath sounds normal. She has no wheezes. She has no rales.  Shallow respirations.  Abdominal: Soft. There is no tenderness.  Skin: No rash noted.    Lab Results Lab Results  Component Value Date   WBC 6.1 04/05/2016   HGB 9.3 (L) 04/05/2016   HCT 29.8 (L) 04/05/2016   MCV 82.5 04/05/2016   PLT 187 04/05/2016    Lab Results  Component Value Date   CREATININE 0.59 04/05/2016   BUN 9 04/05/2016   NA 139 04/05/2016   K 4.3 04/05/2016   CL 105 04/05/2016   CO2 22 04/05/2016    Lab Results  Component Value Date   ALT 13 (L) 04/04/2016   AST 16 04/04/2016   ALKPHOS 71 04/04/2016   BILITOT 0.4 04/04/2016     Microbiology: Recent Results (from the past 240 hour(s))  Blood Culture (routine x 2)     Status: None (Preliminary result)   Collection Time: 04/03/16 12:27 AM  Result Value Ref Range Status   Specimen Description BLOOD RIGHT ANTECUBITAL  Final   Special Requests BOTTLES DRAWN AEROBIC AND ANAEROBIC 5CC  Final   Culture NO GROWTH 1 DAY  Final   Report Status PENDING  Incomplete  Urine culture     Status: Abnormal   Collection Time: 04/03/16  1:34 AM  Result Value Ref Range Status   Specimen  Description URINE, CLEAN CATCH  Final   Special Requests NONE  Final   Culture <10,000 COLONIES/mL INSIGNIFICANT GROWTH (A)  Final   Report Status 04/04/2016 FINAL  Final  Culture, sputum-assessment     Status: None   Collection Time: 04/03/16  2:21 AM  Result Value Ref Range Status   Specimen Description EXPECTORATED SPUTUM  Final   Special Requests NONE  Final   Sputum evaluation THIS SPECIMEN IS ACCEPTABLE FOR SPUTUM CULTURE  Final   Report Status 04/04/2016 FINAL  Final  Culture, respiratory (NON-Expectorated)     Status: None (Preliminary result)   Collection Time: 04/03/16  2:21 AM  Result Value Ref Range Status   Specimen Description EXPECTORATED SPUTUM  Final   Special Requests NONE Reflexed from [id number]  Final   Gram Stain   Final    FEW WBC PRESENT, PREDOMINANTLY PMN FEW BUDDING YEAST SEEN FEW GRAM POSITIVE RODS    Culture PENDING  Incomplete   Report Status PENDING  Incomplete  Respiratory Panel by PCR     Status: None   Collection Time: 04/03/16  4:06 AM  Result Value Ref Range Status   Adenovirus NOT DETECTED NOT DETECTED Final   Coronavirus 229E NOT DETECTED NOT DETECTED Final   Coronavirus HKU1 NOT DETECTED NOT DETECTED Final   Coronavirus NL63 NOT DETECTED NOT DETECTED Final   Coronavirus OC43 NOT DETECTED NOT DETECTED Final   Metapneumovirus NOT DETECTED NOT DETECTED Final   Rhinovirus / Enterovirus NOT DETECTED NOT DETECTED Final   Influenza A NOT DETECTED NOT DETECTED Final   Influenza B NOT DETECTED NOT DETECTED Final   Parainfluenza Virus 1 NOT DETECTED NOT DETECTED Final   Parainfluenza Virus 2 NOT DETECTED NOT DETECTED Final   Parainfluenza Virus 3 NOT DETECTED NOT DETECTED Final   Parainfluenza Virus 4 NOT DETECTED NOT DETECTED Final   Respiratory Syncytial Virus NOT DETECTED NOT DETECTED Final   Bordetella pertussis NOT DETECTED NOT DETECTED Final   Chlamydophila pneumoniae NOT DETECTED NOT DETECTED Final   Mycoplasma pneumoniae NOT DETECTED NOT  DETECTED Final  MRSA PCR Screening     Status: None   Collection Time: 04/03/16  4:07 AM  Result Value Ref Range Status   MRSA by PCR NEGATIVE NEGATIVE Final    Comment:        The  GeneXpert MRSA Assay (FDA approved for NASAL specimens only), is one component of a comprehensive MRSA colonization surveillance program. It is not intended to diagnose MRSA infection nor to guide or monitor treatment for MRSA infections.   Blood Culture (routine x 2)     Status: None (Preliminary result)   Collection Time: 04/03/16  5:18 AM  Result Value Ref Range Status   Specimen Description BLOOD LEFT ANTECUBITAL  Final   Special Requests BOTTLES DRAWN AEROBIC AND ANAEROBIC 4CC  Final   Culture PENDING  Incomplete   Report Status PENDING  Incomplete     ASSESSMENT: Admission blood cultures remain negative but were obtained on ciprofloxacin following her most recent bout of Serratia bacteremia. A repeat echocardiogram last month showed a large tricuspid valve vegetation and her chest x-ray now shows worsening infiltrates. She may have showered more septic pulmonary emboli or have developed HCAP unrelated to her endocarditis. She is improving on broad empiric antibiotic therapy. I recommend continuing vancomycin and cefepime for 7 full days then considering conversion back to oral ciprofloxacin.  PLAN: 1. Continue vancomycin and cefepime for now  Cliffton Asters, MD [facility] [phone] pager   [phone] cell 04/05/2016, 12:40 PM

## 2016-04-05 NOTE — Discharge Summary (Signed)
Discharge Summary   03/14/16  Pt left against medical advise before being admitted into the hospital.   She left while in the ED before being transported into a room.  Please see admission H&P.   Maryln Manuel. Johnson, MD

## 2016-04-05 NOTE — Clinical Social Work Note (Signed)
Clinical Social Work Assessment  Patient Details  Name: Catherine Hernandez MRN: [id number] Date of Birth: 01-24-1986  Date of referral:  04/05/16               Reason for consult:  Housing Concerns/Homelessness, [employer], Substance Use/ETOH Abuse                Permission sought to share information with:    Permission granted to share information::  No  Name::        Agency::     Relationship::     Contact Information:     Housing/Transportation Living arrangements for the past 2 months:  Homeless Source of Information:  Patient, Medical Team Patient Interpreter Needed:  None Criminal Activity/Legal Involvement Pertinent to Current Situation/Hospitalization:  No - Comment as needed Significant Relationships:  Other Family Members Lives with:  Self Do you feel safe going back to the place where you live?  No Need for family participation in patient care:  Yes (Comment)  Care giving concerns:  Patient homeless with current substance abuse.   Social Worker assessment / plan:  CSW met with patient. No supports at bedside. CSW introduced role and inquired about any resources patient would be interested in. Patient agreeable to receiving shelter and food resources. Patient asked for housing resources because she does not have an ID to get into a shelter. CSW offered substance abuse resources but patient declined, stating that she has been doing better. Patient states the last day she used was the day she came into the hospital but before that was before her last admission. Patient states using heroin before her last admission and cocaine before this admission. CSW encouraged patient to contact CSW if she changed her mind. CSW will provide housing resources as well. No further concerns. CSW signing off as social work intervention is no longer needed.  Employment status:  Unemployed Forensic scientist:  Medicaid In [place] PT Recommendations:  Not assessed at this time Information /  Referral to community resources:  Other (Comment Required), Shelter (Food resources, low-income housing.)  Patient/Family's Response to care:  Patient agreeable to shelter/housing and food resources. Patient not agreeable to substance abuse resources, stating that she has been doing better. Patient does not have much of a support system, per staff. Patient appreciated social work intervention.  Patient/Family's Understanding of and Emotional Response to Diagnosis, Current Treatment, and Prognosis:  Patient understands reason for current hospitalization. Patient appears happy with hospital care.  Emotional Assessment Appearance:  Appears stated age Attitude/Demeanor/Rapport:  Lethargic Affect (typically observed):  Depressed, Quiet Orientation:  Oriented to Self, Oriented to Place, Oriented to  Time, Oriented to Situation Alcohol / Substance use:  Tobacco Use, Illicit Drugs Psych involvement (Current and /or in the community):  No (Comment)  Discharge Needs  Concerns to be addressed:  Homelessness, Substance Abuse Concerns Readmission within the last 30 days:  Yes Current discharge risk:  Inadequate Financial Supports, Homeless, Lack of support system, Substance Abuse Barriers to Discharge:  Active Substance Use, Continued Medical Work up, Homeless with medical needs   Candie Chroman, LCSW 04/05/2016, 1:42 PM

## 2016-04-05 NOTE — Progress Notes (Signed)
Pt admitted to the unit as transfer from 3S. Pt A&O x4; IV intact and transfusing; VSS; telemetry applied and verified with CCMD; pt oriented to the unit and room; fall/safety precautions/prevention education completed with pt; no pressure ulcers or open wounds noted. Pt in bed with call light within reach and bed alarm on. Will closely monitor. Dionne BucyP. Amo Zhaire Locker RN

## 2016-04-05 NOTE — Care Management Note (Signed)
Case Management Note  Patient Details  Name: Catherine Hernandez MRN: [id number] Date of Birth: 1985-04-09  Subjective/Objective:   Presents with sob , chest pain and bacteremia, endocarditis, hx of IV Drug Abuse,  NCM spoke with patient, she states she will need transportation ast at dc, ( bus pass) and she may need shelter resources, CSW referral.  She has active medicaid for her medications. NCM will cont to follow for dc needs.                  Action/Plan:   Expected Discharge Date:  04/05/16               Expected Discharge Plan:  Homeless Shelter  In-House Referral:  Clinical Social Work  Discharge planning Services  CM Consult  Post Acute Care Choice:    Choice offered to:     DME Arranged:    DME Agency:     HH Arranged:    HH Agency:     Status of Service:  In process, will continue to follow  If discussed at Long Length of Stay Meetings, dates discussed:    Additional Comments:  Leone Havenaylor, Issis Lindseth Clinton, RN 04/05/2016, 12:09 PM

## 2016-04-06 DIAGNOSIS — Z515 Encounter for palliative care: Secondary | ICD-10-CM

## 2016-04-06 DIAGNOSIS — K089 Disorder of teeth and supporting structures, unspecified: Secondary | ICD-10-CM

## 2016-04-06 DIAGNOSIS — I2690 Septic pulmonary embolism without acute cor pulmonale: Secondary | ICD-10-CM

## 2016-04-06 DIAGNOSIS — F1120 Opioid dependence, uncomplicated: Secondary | ICD-10-CM

## 2016-04-06 DIAGNOSIS — E43 Unspecified severe protein-calorie malnutrition: Secondary | ICD-10-CM

## 2016-04-06 DIAGNOSIS — F17210 Nicotine dependence, cigarettes, uncomplicated: Secondary | ICD-10-CM

## 2016-04-06 DIAGNOSIS — I368 Other nonrheumatic tricuspid valve disorders: Secondary | ICD-10-CM

## 2016-04-06 DIAGNOSIS — Z720 Tobacco use: Secondary | ICD-10-CM

## 2016-04-06 DIAGNOSIS — Y95 Nosocomial condition: Secondary | ICD-10-CM

## 2016-04-06 LAB — CULTURE, RESPIRATORY W GRAM STAIN: Culture: NORMAL

## 2016-04-06 MED ORDER — LEVALBUTEROL HCL 1.25 MG/0.5ML IN NEBU
1.2500 mg | INHALATION_SOLUTION | Freq: Two times a day (BID) | RESPIRATORY_TRACT | Status: DC
  Administered 2016-04-06: 1.25 mg via RESPIRATORY_TRACT
  Filled 2016-04-06: qty 0.5

## 2016-04-06 MED ORDER — LORAZEPAM 2 MG/ML IJ SOLN
1.0000 mg | INTRAMUSCULAR | Status: DC | PRN
  Administered 2016-04-06 – 2016-04-09 (×11): 1 mg via INTRAVENOUS
  Filled 2016-04-06 (×11): qty 1

## 2016-04-06 NOTE — Progress Notes (Signed)
[facility]  Date of Admission:  04/02/2016   Total days of antibiotics 17        Day 4 vancomycin        Day 4 cefepime         Principal Problem:   Endocarditis of tricuspid valve Active Problems:   Sepsis (HCC)   HCAP (healthcare-associated pneumonia)   Enterococcal bacteremia   Staphylococcus aureus bacteremia   Polysubstance abuse   Serratia infection   IVDU (intravenous drug user)   Opioid use disorder, severe, dependence (HCC)   Normocytic anemia   Poor dentition   Protein-calorie malnutrition, severe (HCC)   Symptomatic anemia   Septic embolism (HCC)   Tobacco abuse   Sepsis due to pneumonia (HCC)   . apixaban  5 mg Oral BID  . ceFEPime (MAXIPIME) IV  1 g Intravenous Q8H  . dextromethorphan-guaiFENesin  1 tablet Oral BID  . feeding supplement (ENSURE ENLIVE)  237 mL Oral BID BM  . ferrous gluconate  324 mg Oral TID WC  . folic acid  1 mg Oral Daily  . levalbuterol  1.25 mg Nebulization Q6H  . LORazepam  0-4 mg Intravenous Q12H  . multivitamin with minerals  1 tablet Oral Daily  . nicotine  21 mg Transdermal Daily  . thiamine  100 mg Oral Daily   Or  . thiamine  100 mg Intravenous Daily  . vancomycin  500 mg Intravenous Q12H    SUBJECTIVE: She still having some chest pain and cough but is feeling a little bit better.  Review of Systems: Review of Systems  Unable to perform ROS: Mental acuity  Constitutional:       She is very drowsy and repeatedly falls off to sleep and is unable to answer most questions.    Past Medical History:  Diagnosis Date  . Anemia   . CAP (community acquired pneumonia) 11/16/2015  . Childhood asthma   . Enterococcal bacteremia   . Heroin abuse   . Hyponatremia   . Pneumonia 2007  . Polysubstance abuse     Social History  Substance Use Topics  . Smoking status: Current Every Day Smoker    Packs/day: 0.50    Years: 10.00    Types: Cigarettes  . Smokeless tobacco: Never Used  .  Alcohol use No    Family History  Problem Relation Age of Onset  . Anesthesia problems Neg Hx   . Hypotension Neg Hx   . Malignant hyperthermia Neg Hx   . Pseudochol deficiency Neg Hx    No Known Allergies  OBJECTIVE: Vitals:   04/05/16 1733 04/05/16 2121 04/06/16 0642 04/06/16 0812  BP: 117/84 105/66 113/73   Pulse: 100 97 98   Resp: (!) 22 20 (!) 22   Temp: 97.5 F (36.4 C) 97.7 F (36.5 C) (!) 100.9 F (38.3 C)   TempSrc: Oral Oral Oral   SpO2: 100% 99% 98% 98%  Weight:      Height:       Body mass index is 16.4 kg/m.  Physical Exam  Constitutional: No distress.  Very groggy.  Cardiovascular: Normal rate and regular rhythm.   No murmur heard. Pulmonary/Chest: Effort normal and breath sounds normal. She has no wheezes. She has no rales.  Shallow respirations.  Abdominal: Soft. There is no tenderness.  Skin: No rash noted.    Lab Results Lab Results  Component Value Date   WBC 6.1 04/05/2016  HGB 9.3 (L) 04/05/2016   HCT 29.8 (L) 04/05/2016   MCV 82.5 04/05/2016   PLT 187 04/05/2016    Lab Results  Component Value Date   CREATININE 0.59 04/05/2016   BUN 9 04/05/2016   NA 139 04/05/2016   K 4.3 04/05/2016   CL 105 04/05/2016   CO2 22 04/05/2016    Lab Results  Component Value Date   ALT 13 (L) 04/04/2016   AST 16 04/04/2016   ALKPHOS 71 04/04/2016   BILITOT 0.4 04/04/2016     Microbiology: Recent Results (from the past 240 hour(s))  Blood Culture (routine x 2)     Status: None (Preliminary result)   Collection Time: 04/03/16 12:27 AM  Result Value Ref Range Status   Specimen Description BLOOD RIGHT ANTECUBITAL  Final   Special Requests BOTTLES DRAWN AEROBIC AND ANAEROBIC 5CC  Final   Culture NO GROWTH 2 DAYS  Final   Report Status PENDING  Incomplete  Urine culture     Status: Abnormal   Collection Time: 04/03/16  1:34 AM  Result Value Ref Range Status   Specimen Description URINE, CLEAN CATCH  Final   Special Requests NONE  Final    Culture <10,000 COLONIES/mL INSIGNIFICANT GROWTH (A)  Final   Report Status 04/04/2016 FINAL  Final  Culture, sputum-assessment     Status: None   Collection Time: 04/03/16  2:21 AM  Result Value Ref Range Status   Specimen Description EXPECTORATED SPUTUM  Final   Special Requests NONE  Final   Sputum evaluation THIS SPECIMEN IS ACCEPTABLE FOR SPUTUM CULTURE  Final   Report Status 04/04/2016 FINAL  Final  Culture, respiratory (NON-Expectorated)     Status: None   Collection Time: 04/03/16  2:21 AM  Result Value Ref Range Status   Specimen Description EXPECTORATED SPUTUM  Final   Special Requests NONE Reflexed from [id number]  Final   Gram Stain   Final    FEW WBC PRESENT, PREDOMINANTLY PMN FEW BUDDING YEAST SEEN FEW GRAM POSITIVE RODS    Culture Consistent with normal respiratory flora.  Final   Report Status 04/06/2016 FINAL  Final  Respiratory Panel by PCR     Status: None   Collection Time: 04/03/16  4:06 AM  Result Value Ref Range Status   Adenovirus NOT DETECTED NOT DETECTED Final   Coronavirus 229E NOT DETECTED NOT DETECTED Final   Coronavirus HKU1 NOT DETECTED NOT DETECTED Final   Coronavirus NL63 NOT DETECTED NOT DETECTED Final   Coronavirus OC43 NOT DETECTED NOT DETECTED Final   Metapneumovirus NOT DETECTED NOT DETECTED Final   Rhinovirus / Enterovirus NOT DETECTED NOT DETECTED Final   Influenza A NOT DETECTED NOT DETECTED Final   Influenza B NOT DETECTED NOT DETECTED Final   Parainfluenza Virus 1 NOT DETECTED NOT DETECTED Final   Parainfluenza Virus 2 NOT DETECTED NOT DETECTED Final   Parainfluenza Virus 3 NOT DETECTED NOT DETECTED Final   Parainfluenza Virus 4 NOT DETECTED NOT DETECTED Final   Respiratory Syncytial Virus NOT DETECTED NOT DETECTED Final   Bordetella pertussis NOT DETECTED NOT DETECTED Final   Chlamydophila pneumoniae NOT DETECTED NOT DETECTED Final   Mycoplasma pneumoniae NOT DETECTED NOT DETECTED Final  MRSA PCR Screening     Status: None   Collection  Time: 04/03/16  4:07 AM  Result Value Ref Range Status   MRSA by PCR NEGATIVE NEGATIVE Final    Comment:        The GeneXpert MRSA Assay (FDA approved for NASAL  specimens only), is one component of a comprehensive MRSA colonization surveillance program. It is not intended to diagnose MRSA infection nor to guide or monitor treatment for MRSA infections.   Blood Culture (routine x 2)     Status: None (Preliminary result)   Collection Time: 04/03/16  5:18 AM  Result Value Ref Range Status   Specimen Description BLOOD LEFT ANTECUBITAL  Final   Special Requests BOTTLES DRAWN AEROBIC AND ANAEROBIC 4CC  Final   Culture NO GROWTH 2 DAYS  Final   Report Status PENDING  Incomplete     ASSESSMENT: Admission blood cultures remain negative but were obtained on ciprofloxacin following her most recent bout of Serratia bacteremia. A repeat echocardiogram last month showed a large tricuspid valve vegetation and her chest x-ray now shows worsening infiltrates. She may have showered more septic pulmonary emboli or have developed HCAP unrelated to her endocarditis. She is improving on broad empiric antibiotic therapy. I recommend continuing vancomycin and cefepime for 7 full days. Since this episode is likely to be due to a ciprofloxacin resistant organism I will switch her to oral trimethoprim sulfamethoxazole on completion of IV antibiotic therapy. Her previous staph aureus and Serratia isolate its are both sensitive to trimethoprim sulfamethoxazole.  PLAN: 1. Continue vancomycin and cefepime for 3 more days  Cliffton Asters, MD [facility] [phone] pager   [phone] cell 04/06/2016, 10:54 AM

## 2016-04-06 NOTE — Progress Notes (Signed)
Pharmacy Antibiotic Note  Catherine Hernandez is a 31 y.o. female admitted on 04/02/2016 with pneumonia.  Pharmacy has been consulted for vancomycin dosing. Also on cefepime per MD. IV antibiotics day #4/7 per ID recs. Tmax/24h 100.9, wbc wnl. SCr stable 0.59, CrCl~73.   Plan: Vancomycin 1000mg  given in ED then 500mg  IV every 12 hours.  Goal trough 15-20 mcg/mL.  Cefepime 1g IV q8h per MD Monitor clinical progress, c/s, renal function, abx plan/LOT Vancomycin trough 1/4 PM    Temp (24hrs), Avg:98.3 F (36.8 C), Min:97.2 F (36.2 C), Max:100.9 F (38.3 C)   Recent Labs Lab 04/03/16 0021 04/03/16 0037 04/03/16 0518 04/03/16 0842 04/04/16 0530 04/05/16 0745  WBC 14.9*  --   --   --  9.7 6.1  CREATININE 0.77  --   --   --  0.71 0.59  LATICACIDVEN  --  1.65 1.2 1.2  --   --     Estimated Creatinine Clearance: 73.9 mL/min (by C-G formula based on SCr of 0.59 mg/dL).    No Known Allergies   Babs BertinHaley Adelyna Brockman, PharmD, BCPS Clinical Pharmacist 04/06/2016 11:17 AM

## 2016-04-06 NOTE — Progress Notes (Signed)
PROGRESS NOTE    Catherine Hernandez  NWG:[id number] DOB: 11-Feb-1986 DOA: 04/02/2016 PCP: No PCP Per Patient   Brief Narrative:   31 y.o. WF PMHX polysubstance abuse (heroin, opioid-dependence, cocaine, tobacco), asthma, Anxiety,Anemia, Serratia Bacteremia, Multifocal Pneumonia with cavitation, Septic Emboli, Endocarditis, PE on Eliquis, who presents with cough and SOB.  Pt was recently hospitalized from 12/17 to 12/26 due to sepsis secondary to serratia bacteremia, multifocal pneumonia with cavitation, septic emboli, endocarditis and PE. Pt was discharged on Cipro and Eliquis for PE. Today she comes back due to worsening SOB, chest pain and cough. She coughs up yellowish sputum. She states that she has frontal chest pain, which is constant,10/10, pleuritic, nonradiating. She denies fever or chills. She states that she has been compliant to Eliquis. Denies runny nose or sore throat. She does not have nausea, vomiting, abdominal pain, diarrhea, symptoms of UTI or unilateral weakness.    Subjective: 1/4 MAXIMUM TEMPERATURE last 24 hours 38.3C patient lethargic but arousable A/O 4 when awake.      Assessment & Plan:   Principal Problem:   Endocarditis of tricuspid valve Active Problems:   Sepsis (HCC)   Opioid use disorder, severe, dependence (HCC)   Enterococcal bacteremia   Staphylococcus aureus bacteremia   Normocytic anemia   Poor dentition   Protein-calorie malnutrition, severe (HCC)   Polysubstance abuse   Symptomatic anemia   HCAP (healthcare-associated pneumonia)   Septic embolism (HCC)   Tobacco abuse   Serratia infection   IVDU (intravenous drug user)   Sepsis due to pneumonia (HCC)  Sepsis due to HCAP, with hx of serratia bacteremia, multifocal pneumonia with cavitation and septic emboli:  -pt is septic with leukocytosis, tachycardia and tachypnea. Lactate is normal. Hemodynamically stable. Per previous discharge summary, "pt is not a candidate for home PICC line.  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 CT surgery was consulted. Patient was seen by Dr. Morton Peters who felt that patient would not survive heartfelt surgery and should be treated with antibiotics with emphasis on comfort care."  Pt was discharged on Cipro for one month per ID, Dr. Daiva Eves. -Continue current antibiotic -- Mucinex for cough  - Xopenex Neb QID  -Cultures negative to date  -Lactic acid normalized -Titrate O2 to maintain SPO2> 3% -Flutter valve -Solu-Medrol 60 mg daily  TV vegetation/severe regurgitation -On last admission cardiothoracic surgery state patient not a candidate for surgery.  Pulmonary hypertension -Conservative treatment  Bilateral Pulmonary embolism:  -continue Eliquis  Polysubstance abuse: -1/1 UDS positive for cocaine, benzodiazepine, marijuana -counseled strongly. -Ativan for withdraw. Currently no symptoms for withdraw -nicotine patch   Drug-seeking behavior -As soon as patient wakes up requests IV narcotics and benzodiazepine. DO NOT PROVIDE NARCOTICS. See withdraw of cocaine/benzodiazepine  Cocaine/Benzodiazepine withdrawal -CIWA protocol -Promethazine IV PRN N/V  Anemia of chronic disease and iron deficiency anemia:  hgb stable. 9.2 on 03/28/16-->9.3 today. -continue iron supplement -Transfuse for hemoglobin <7 -1/2 transfuse 1 unit PRBC  Severe Protein calorie malnutrition  - Nutrition consult   Goals of care -PALLIATIVE CARE: Spoke with patient see note     DVT prophylaxis: Eliquis Code Status: Full Family Communication: None Disposition Plan: Discharge in 72 hours. Prior to discharge ID will switch patient to oral trimethoprim sulfamethoxazole     Consultants:  Palliative care pending    Procedures/Significant Events:  4/19 Echocardiogram: LVEF=: 65% to 70%. - Tricuspid valve: Large 1 cm  mobile  vegetation on the anterior leaflet of the tricuspid valve with severe tricuspid regurgitation, consistent with vegetation. Severe  regurgitation. - - Pulmonary arteries: PA peak pressure: 41 mm Hg (S). - Inferior vena cava: The vessel was dilated.  12/20 CT Angiogram chest:--Bilateral lower lobe pulmonary emboli with tapered appearance, this can be seen with chronic pulmonary emboli or possibly vasculopathy secondary to septic emboli. --Multiple cavitating and non cavitating pneumonia/consolidation of varying ages consistent with septic emboli. --Mild cardiomegaly and dilated RIGHT atrium. --Suspected splenomegaly. 1/2 transfuse 1 unit PRBC    VENTILATOR SETTINGS: None   Cultures 12/19 Hepatitis C antibody positive 12/19 HIV negative 1/1 blood NGTD 1/1 urine insignificant growth 1/1 MRSA by PCR negative 1/1 strep pneumo/Legionella urine antigen negative 1/2 induction sputum normal flora 1/2 Hepatitis C RNA negative    Antimicrobials: Anti-infectives    Start     Stop   04/04/16 1000  azithromycin (ZITHROMAX) tablet 250 mg  Status:  Discontinued     04/03/16 0836   04/03/16 1600  vancomycin (VANCOCIN) 500 mg in sodium chloride 0.9 % 100 mL IVPB         04/03/16 1000  azithromycin (ZITHROMAX) tablet 500 mg  Status:  Discontinued     04/03/16 0836   04/03/16 0800  ceFEPIme (MAXIPIME) 1 g in dextrose 5 % 50 mL IVPB     04/11/16 0759   04/03/16 0100  vancomycin (VANCOCIN) IVPB 1000 mg/200 mL premix     04/03/16 0318   04/03/16 0100  ceFEPIme (MAXIPIME) 2 g in dextrose 5 % 50 mL IVPB             Devices NA   LINES / TUBES:  NA    Continuous Infusions:    Objective: Vitals:   04/05/16 1733 04/05/16 2121 04/06/16 0642 04/06/16 0812  BP: 117/84 105/66 113/73   Pulse: 100 97 98   Resp: (!) 22 20 (!) 22   Temp: 97.5 F (36.4 C) 97.7 F (36.5 C) (!) 100.9 F (38.3 C)   TempSrc: Oral Oral Oral   SpO2: 100% 99% 98% 98%  Weight:      Height:         Intake/Output Summary (Last 24 hours) at 04/06/16 0843 Last data filed at 04/06/16 0035  Gross per 24 hour  Intake          1163.75 ml  Output                0 ml  Net          1163.75 ml   Filed Weights   04/03/16 0727  Weight: 44.7 kg (98 lb 8.7 oz)    Examination:  General: A/O 4, cachectic, positive acute respiratory distress Eyes: negative scleral hemorrhage, negative anisocoria, negative icterus ENT: Negative Runny nose, negative gingival bleeding, Neck:  Negative scars, masses, torticollis, lymphadenopathy, JVD Lungs: poor air movement in all lung fields, positive expiratory wheezing, positive by basilar crackles.  Cardiovascular: Tachycardic, Regular rhythm, positive grade 4/6 systolic murmur, negative gallop or rub normal S1 and S2 Abdomen: negative abdominal pain, nondistended, positive soft, bowel sounds, no rebound, no ascites, no appreciable mass Extremities: No significant cyanosis, clubbing, or edema bilateral lower extremities Skin: Negative rashes, lesions, ulcers, multiple tattoos Psychiatric:  Negative depression, negative anxiety, negative fatigue, negative mania  Central nervous system:  Cranial nerves II through XII intact, tongue/uvula midline, all extremities muscle strength 5/5, sensation intact throughout,  negative dysarthria, negative expressive aphasia, negative receptive aphasia.  .Marland Kitchen  Data Reviewed: Care during the described time interval was provided by me .  I have reviewed this patient's available data, including medical history, events of note, physical examination, and all test results as part of my evaluation. I have personally reviewed and interpreted all radiology studies.  CBC:  Recent Labs Lab 04/03/16 0021 04/04/16 0530 04/05/16 0745  WBC 14.9* 9.7 6.1  NEUTROABS 10.1*  --  4.6  HGB 9.3* 7.1* 9.3*  HCT 29.9* 23.4* 29.8*  MCV 80.6 82.7 82.5  PLT 336 205 187   Basic Metabolic Panel:  Recent Labs Lab 04/03/16 0021  04/04/16 0530 04/05/16 0745  NA 135 134* 139  K 3.6 3.9 4.3  CL 98* 105 105  CO2 25 23 22   GLUCOSE 111* 114* 143*  BUN 14 <5* 9  CREATININE 0.77 0.71 0.59  CALCIUM 8.6* 7.8* 8.5*  MG  --   --  2.1   GFR: Estimated Creatinine Clearance: 73.9 mL/min (by C-G formula based on SCr of 0.59 mg/dL). Liver Function Tests:  Recent Labs Lab 04/03/16 0021 04/04/16 0530  AST 22 16  ALT 20 13*  ALKPHOS 111 71  BILITOT 0.5 0.4  PROT 8.7* 6.6  ALBUMIN 2.3* 1.7*   No results for input(s): LIPASE, AMYLASE in the last 168 hours. No results for input(s): AMMONIA in the last 168 hours. Coagulation Profile:  Recent Labs Lab 04/03/16 0518  INR 1.25   Cardiac Enzymes: No results for input(s): CKTOTAL, CKMB, CKMBINDEX, TROPONINI in the last 168 hours. BNP (last 3 results) No results for input(s): PROBNP in the last 8760 hours. HbA1C: No results for input(s): HGBA1C in the last 72 hours. CBG: No results for input(s): GLUCAP in the last 168 hours. Lipid Profile: No results for input(s): CHOL, HDL, LDLCALC, TRIG, CHOLHDL, LDLDIRECT in the last 72 hours. Thyroid Function Tests: No results for input(s): TSH, T4TOTAL, FREET4, T3FREE, THYROIDAB in the last 72 hours. Anemia Panel: No results for input(s): VITAMINB12, FOLATE, FERRITIN, TIBC, IRON, RETICCTPCT in the last 72 hours. Urine analysis:    Component Value Date/Time   COLORURINE YELLOW 04/03/2016 0134   APPEARANCEUR HAZY (A) 04/03/2016 0134   LABSPEC 1.015 04/03/2016 0134   PHURINE 6.0 04/03/2016 0134   GLUCOSEU NEGATIVE 04/03/2016 0134   HGBUR LARGE (A) 04/03/2016 0134   BILIRUBINUR NEGATIVE 04/03/2016 0134   KETONESUR NEGATIVE 04/03/2016 0134   PROTEINUR 30 (A) 04/03/2016 0134   UROBILINOGEN 0.2 01/07/2012 1140   NITRITE NEGATIVE 04/03/2016 0134   LEUKOCYTESUR NEGATIVE 04/03/2016 0134   Sepsis Labs: @LABRCNTIP (procalcitonin:4,lacticidven:4)  ) Recent Results (from the past 240 hour(s))  Blood Culture (routine x 2)      Status: None (Preliminary result)   Collection Time: 04/03/16 12:27 AM  Result Value Ref Range Status   Specimen Description BLOOD RIGHT ANTECUBITAL  Final   Special Requests BOTTLES DRAWN AEROBIC AND ANAEROBIC 5CC  Final   Culture NO GROWTH 2 DAYS  Final   Report Status PENDING  Incomplete  Urine culture     Status: Abnormal   Collection Time: 04/03/16  1:34 AM  Result Value Ref Range Status   Specimen Description URINE, CLEAN CATCH  Final   Special Requests NONE  Final   Culture <10,000 COLONIES/mL INSIGNIFICANT GROWTH (A)  Final   Report Status 04/04/2016 FINAL  Final  Culture, sputum-assessment     Status: None   Collection Time: 04/03/16  2:21 AM  Result Value Ref Range Status   Specimen Description EXPECTORATED SPUTUM  Final   Special Requests  NONE  Final   Sputum evaluation THIS SPECIMEN IS ACCEPTABLE FOR SPUTUM CULTURE  Final   Report Status 04/04/2016 FINAL  Final  Culture, respiratory (NON-Expectorated)     Status: None   Collection Time: 04/03/16  2:21 AM  Result Value Ref Range Status   Specimen Description EXPECTORATED SPUTUM  Final   Special Requests NONE Reflexed from [id number]  Final   Gram Stain   Final    FEW WBC PRESENT, PREDOMINANTLY PMN FEW BUDDING YEAST SEEN FEW GRAM POSITIVE RODS    Culture Consistent with normal respiratory flora.  Final   Report Status 04/06/2016 FINAL  Final  Respiratory Panel by PCR     Status: None   Collection Time: 04/03/16  4:06 AM  Result Value Ref Range Status   Adenovirus NOT DETECTED NOT DETECTED Final   Coronavirus 229E NOT DETECTED NOT DETECTED Final   Coronavirus HKU1 NOT DETECTED NOT DETECTED Final   Coronavirus NL63 NOT DETECTED NOT DETECTED Final   Coronavirus OC43 NOT DETECTED NOT DETECTED Final   Metapneumovirus NOT DETECTED NOT DETECTED Final   Rhinovirus / Enterovirus NOT DETECTED NOT DETECTED Final   Influenza A NOT DETECTED NOT DETECTED Final   Influenza B NOT DETECTED NOT DETECTED Final   Parainfluenza Virus 1  NOT DETECTED NOT DETECTED Final   Parainfluenza Virus 2 NOT DETECTED NOT DETECTED Final   Parainfluenza Virus 3 NOT DETECTED NOT DETECTED Final   Parainfluenza Virus 4 NOT DETECTED NOT DETECTED Final   Respiratory Syncytial Virus NOT DETECTED NOT DETECTED Final   Bordetella pertussis NOT DETECTED NOT DETECTED Final   Chlamydophila pneumoniae NOT DETECTED NOT DETECTED Final   Mycoplasma pneumoniae NOT DETECTED NOT DETECTED Final  MRSA PCR Screening     Status: None   Collection Time: 04/03/16  4:07 AM  Result Value Ref Range Status   MRSA by PCR NEGATIVE NEGATIVE Final    Comment:        The GeneXpert MRSA Assay (FDA approved for NASAL specimens only), is one component of a comprehensive MRSA colonization surveillance program. It is not intended to diagnose MRSA infection nor to guide or monitor treatment for MRSA infections.   Blood Culture (routine x 2)     Status: None (Preliminary result)   Collection Time: 04/03/16  5:18 AM  Result Value Ref Range Status   Specimen Description BLOOD LEFT ANTECUBITAL  Final   Special Requests BOTTLES DRAWN AEROBIC AND ANAEROBIC 4CC  Final   Culture NO GROWTH 2 DAYS  Final   Report Status PENDING  Incomplete         Radiology Studies: No results found.      Scheduled Meds: . apixaban  5 mg Oral BID  . ceFEPime (MAXIPIME) IV  1 g Intravenous Q8H  . dextromethorphan-guaiFENesin  1 tablet Oral BID  . feeding supplement (ENSURE ENLIVE)  237 mL Oral BID BM  . ferrous gluconate  324 mg Oral TID WC  . folic acid  1 mg Oral Daily  . levalbuterol  1.25 mg Nebulization Q6H  . LORazepam  0-4 mg Intravenous Q12H  . multivitamin with minerals  1 tablet Oral Daily  . nicotine  21 mg Transdermal Daily  . thiamine  100 mg Oral Daily   Or  . thiamine  100 mg Intravenous Daily  . vancomycin  500 mg Intravenous Q12H   Continuous Infusions:    LOS: 3 days    Time spent: 40 minutes    Elyna Pangilinan, Roselind Messier, MD [facility]  Pager  [phone]   If 7PM-7AM, please contact night-coverage [email or web address] Password [facility] 04/06/2016, 8:43 AM

## 2016-04-06 NOTE — Progress Notes (Signed)
Daily Progress Note   Patient Name: Catherine Hernandez       Date: 04/06/2016 DOB: January 16, 1986  Age: 31 y.o. MRN#: [id number] Attending Physician: Drema Dallasurtis J Woods, MD Primary Care Physician: No PCP Per Patient Admit Date: 04/02/2016  Reason for Consultation/Follow-up: Establishing goals of care and Psychosocial/spiritual support 2/2 to long h/o polysubstance abuse now with endocarditis and limited treatment options  Subjective:  -sat at bedside, offered emotional support.  Patient was engaged and again today shared parts of her life story with me.  She expressed mostly sadness.   She ask that I call her Aunt Catherine Hernandez #[phone], "she is the only one who hasn't turned her back on me".  I spoke to Catherine Hernandez, she reinforces the complicated family dynamics, mostly tangled in poverty and addiction.  Another family member died last week from overdose.  Catherine Hernandez is unable/unwilling to take Catherine Hernandez into her home.  Catherine Hernandez understands  her poor prognosis and in some way is processing her own mortality, she verbalize "I don't want  to die'     Length of Stay: 3  Current Medications: Scheduled Meds:  . apixaban  5 mg Oral BID  . ceFEPime (MAXIPIME) IV  1 g Intravenous Q8H  . dextromethorphan-guaiFENesin  1 tablet Oral BID  . feeding supplement (ENSURE ENLIVE)  237 mL Oral BID BM  . ferrous gluconate  324 mg Oral TID WC  . folic acid  1 mg Oral Daily  . levalbuterol  1.25 mg Nebulization BID  . LORazepam  0-4 mg Intravenous Q12H  . multivitamin with minerals  1 tablet Oral Daily  . nicotine  21 mg Transdermal Daily  . thiamine  100 mg Oral Daily  . vancomycin  500 mg Intravenous Q12H    Continuous Infusions:   PRN Meds: acetaminophen, LORazepam **OR** LORazepam, ondansetron, promethazine,  zolpidem  Physical Exam  Constitutional: She appears cachectic. She appears ill.  Cardiovascular: Tachycardia present.   No murmur heard. Pulmonary/Chest: She has decreased breath sounds in the right lower field and the left lower field.  Neurological: She is alert.  Skin: Skin is warm and dry.  - noted many areas scarred and discolored from long term IV drug use            Vital Signs: BP 101/71 (BP Location: Right Arm)  Pulse (!) 108   Temp 97.8 F (36.6 C) (Oral)   Resp (!) 22   Ht 5\' 5"  (1.651 m)   Wt 44.7 kg (98 lb 8.7 oz)   SpO2 99%   BMI 16.40 kg/m  SpO2: SpO2: 99 % O2 Device: O2 Device: Not Delivered O2 Flow Rate: O2 Flow Rate (L/min): 2 L/min  Intake/output summary:   Intake/Output Summary (Last 24 hours) at 04/06/16 1602 Last data filed at 04/06/16 1300  Gross per 24 hour  Intake              530 ml  Output                0 ml  Net              530 ml   LBM: Last BM Date: 04/03/16 Baseline Weight: Weight: 44.7 kg (98 lb 8.7 oz) Most recent weight: Weight: 44.7 kg (98 lb 8.7 oz)       Palliative Assessment/Data: 40%    Flowsheet Rows   Flowsheet Row Most Recent Value  Intake Tab  Referral Department  Hospitalist  Unit at Time of Referral  ICU  Palliative Care Primary Diagnosis  Sepsis/Infectious Disease  Date Notified  04/03/16  Palliative Care Type  Return patient Palliative Care  Reason for referral  Clarify Goals of Care, Psychosocial or Spiritual support  Date of Admission  04/02/16  Date first seen by Palliative Care  04/03/16  # of days Palliative referral response time  0 Day(s)  # of days IP prior to Palliative referral  1  Clinical Assessment  Palliative Performance Scale Score  60%  Psychosocial & Spiritual Assessment  Palliative Care Outcomes      Patient Active Problem List   Diagnosis Date Noted  . Serratia infection 04/04/2016  . IVDU (intravenous drug user) 04/04/2016  . Sepsis due to pneumonia (HCC)   . Tobacco abuse  04/03/2016  . Palliative care by specialist 03/24/2016  . Septic embolism (HCC)   . HCAP (healthcare-associated pneumonia) 03/15/2016  . Symptomatic anemia 03/14/2016  . 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 Polysubstance abuse   . Enterococcal bacteremia 11/18/2015  . Staphylococcus aureus bacteremia 11/18/2015  . Normocytic anemia 11/18/2015  . Poor dentition 11/18/2015  . Protein-calorie malnutrition, severe (HCC) 11/18/2015  . Opioid use disorder, severe, dependence (HCC) 11/17/2015  . Sepsis (HCC) 11/16/2015    Palliative Care Assessment & Plan    Assessment: 31 y.o.femalewith medical history significant of polysubstance abuse (heroin, opioid-dependence, cocaine, tobacco), asthma, anxiety, anemia, serratia bacteremia, multifocal pneumonia with cavitation, septic emboli, endocarditis, PE on Eliquis, who presented with cough and SOB.  Pt was recently hospitalized from 12/17 to 12/26 due to sepsis secondary to serratia bacteremia, multifocal pneumonia with cavitation, septic emboli, endocarditis and PE. Pt was discharged on Cipro and Eliquis for PE.   Admitted due to worsening SOB, chest pain and cough. Productive cough for yellowish sputum, frontal chest pain, which is constant,10/10, pleuritic, nonradiating.  In the ER pt was found to have WBC 14.9, lactate1.65, BNP 89.9, electrolytes &renal function okay, temperature 98.9, tachycardia with HRup to 126, tachycardia, O2 sat 97% on room air. Chest x-ray showed worsening multifocal pneumonia. Pt  admitted to SDU as inpt.  The patient is cachetic and protein malnourished with albumin 2.3  Per cardiothoracic surgery the patient would not survive heart valve surgery and should be treated with antibiotics and emphasis on comfort care  Patient faces complicated emotional and psychological Issues,  addiction,  along with facing her own mortality. "I am afraid to die"  Will continue to meet with patient while  hospitalized   Recommendations/Plan:  I wish I had a viable recommendation to offer.  She is ravaged by addiction, lives in poverty (homeless and all her belonging are in a backpack) and has no person willing or able to take her into their care at this time.   She is open to all offered and available medical interventions to prolong life  Utilize home medications for symptom management, consider IP Drug rehab   Code Status:      Encoruaged patient to consider DNR/DNI status knowing poor outcomes in similar patients      Code Status Orders        Start     Ordered   04/03/16 0222  Full code  Continuous     04/03/16 0222    Code Status History    Date Active Date Inactive Code Status Order ID Comments User Context   03/20/2016 12:44 AM 03/28/2016  5:48 PM Full Code [id number]  Hillary Bow, DO ED   03/15/2016  5:24 PM 03/15/2016 11:20 PM Full Code [id number]  Tyrone Nine, MD Inpatient   12/16/2015  9:06 AM 12/19/2015 11:22 PM Full Code [id number]  Ozella Rocks, MD ED   11/17/2015  1:50 AM 12/02/2015  5:55 PM Full Code [id number]  Jonah Blue, MD Inpatient       Prognosis:   Unable to determine-dependant on desire for life prolonging measures  Discharge Planning:  To Be Determined  Care plan was discussed with bedside RN  Thank you for allowing the Palliative Medicine Team to assist in the care of this patient.   Time In: 1600 Time Out: 1635 Total Time 35 min Prolonged Time Billed  no       Greater than 50%  of this time was spent counseling and coordinating care related to the above assessment and plan.  Lorinda Creed, NP  Please contact Palliative Medicine Team phone at [phone] for questions and concerns.

## 2016-04-06 NOTE — Progress Notes (Signed)
Pt has refused her morning lab draw

## 2016-04-07 DIAGNOSIS — I330 Acute and subacute infective endocarditis: Secondary | ICD-10-CM

## 2016-04-07 DIAGNOSIS — B9689 Other specified bacterial agents as the cause of diseases classified elsewhere: Secondary | ICD-10-CM

## 2016-04-07 DIAGNOSIS — Z8619 Personal history of other infectious and parasitic diseases: Secondary | ICD-10-CM

## 2016-04-07 DIAGNOSIS — J189 Pneumonia, unspecified organism: Secondary | ICD-10-CM

## 2016-04-07 LAB — VANCOMYCIN, TROUGH: Vancomycin Tr: 4 ug/mL — ABNORMAL LOW (ref 15–20)

## 2016-04-07 LAB — CBC WITH DIFFERENTIAL/PLATELET
BASOS ABS: 0 10*3/uL (ref 0.0–0.1)
Basophils Relative: 0 %
EOS ABS: 0.1 10*3/uL (ref 0.0–0.7)
EOS PCT: 1 %
HCT: 31.9 % — ABNORMAL LOW (ref 36.0–46.0)
Hemoglobin: 9.8 g/dL — ABNORMAL LOW (ref 12.0–15.0)
LYMPHS ABS: 2.3 10*3/uL (ref 0.7–4.0)
Lymphocytes Relative: 27 %
MCH: 25.3 pg — ABNORMAL LOW (ref 26.0–34.0)
MCHC: 30.7 g/dL (ref 30.0–36.0)
MCV: 82.4 fL (ref 78.0–100.0)
MONO ABS: 0.7 10*3/uL (ref 0.1–1.0)
Monocytes Relative: 8 %
Neutro Abs: 5.5 10*3/uL (ref 1.7–7.7)
Neutrophils Relative %: 64 %
PLATELETS: 215 10*3/uL (ref 150–400)
RBC: 3.87 MIL/uL (ref 3.87–5.11)
RDW: 19.5 % — ABNORMAL HIGH (ref 11.5–15.5)
WBC: 8.6 10*3/uL (ref 4.0–10.5)

## 2016-04-07 LAB — BASIC METABOLIC PANEL
ANION GAP: 9 (ref 5–15)
BUN: 8 mg/dL (ref 6–20)
CALCIUM: 7.9 mg/dL — AB (ref 8.9–10.3)
CO2: 25 mmol/L (ref 22–32)
Chloride: 104 mmol/L (ref 101–111)
Creatinine, Ser: 0.62 mg/dL (ref 0.44–1.00)
GFR calc Af Amer: >60 mL/min (ref >60)
GLUCOSE: 85 mg/dL (ref 65–99)
Potassium: 3.5 mmol/L (ref 3.5–5.1)
SODIUM: 138 mmol/L (ref 135–145)

## 2016-04-07 LAB — MAGNESIUM: Magnesium: 1.7 mg/dL (ref 1.7–2.4)

## 2016-04-07 MED ORDER — VANCOMYCIN HCL IN DEXTROSE 1-5 GM/200ML-% IV SOLN
1000.0000 mg | Freq: Two times a day (BID) | INTRAVENOUS | Status: DC
Start: 2016-04-07 — End: 2016-04-09
  Administered 2016-04-07 – 2016-04-09 (×5): 1000 mg via INTRAVENOUS
  Filled 2016-04-07 (×7): qty 200

## 2016-04-07 NOTE — Progress Notes (Signed)
Patient ID: Catherine Hernandez, female   DOB: 1986-01-03, 31 y.o.   MRN: [id number]          [facility]  Date of Admission:  04/02/2016   Total days of antibiotics 18        Day 5 vancomycin        Day 5 cefepime         Principal Problem:   Endocarditis of tricuspid valve Active Problems:   Sepsis (HCC)   HCAP (healthcare-associated pneumonia)   Enterococcal bacteremia   Staphylococcus aureus bacteremia   Polysubstance abuse   Serratia infection   IVDU (intravenous drug user)   Opioid use disorder, severe, dependence (HCC)   Normocytic anemia   Poor dentition   Protein-calorie malnutrition, severe (HCC)   Symptomatic anemia   Septic embolism (HCC)   Tobacco abuse   Sepsis due to pneumonia (HCC)   . apixaban  5 mg Oral BID  . ceFEPime (MAXIPIME) IV  1 g Intravenous Q8H  . dextromethorphan-guaiFENesin  1 tablet Oral BID  . feeding supplement (ENSURE ENLIVE)  237 mL Oral BID BM  . ferrous gluconate  324 mg Oral TID WC  . folic acid  1 mg Oral Daily  . multivitamin with minerals  1 tablet Oral Daily  . nicotine  21 mg Transdermal Daily  . thiamine  100 mg Oral Daily  . vancomycin  1,000 mg Intravenous Q12H    SUBJECTIVE: She is very groggy and not answering questions right now.  Review of Systems: Review of Systems  Unable to perform ROS: Mental acuity  Constitutional:       She is very drowsy and repeatedly falls off to sleep and is unable to answer most questions.    Past Medical History:  Diagnosis Date  . Anemia   . CAP (community acquired pneumonia) 11/16/2015  . Childhood asthma   . Enterococcal bacteremia   . Heroin abuse   . Hyponatremia   . Pneumonia 2007  . Polysubstance abuse     Social History  Substance Use Topics  . Smoking status: Current Every Day Smoker    Packs/day: 0.50    Years: 10.00    Types: Cigarettes  . Smokeless tobacco: Never Used  . Alcohol use No    Family History  Problem Relation Age of  Onset  . Anesthesia problems Neg Hx   . Hypotension Neg Hx   . Malignant hyperthermia Neg Hx   . Pseudochol deficiency Neg Hx    No Known Allergies  OBJECTIVE: Vitals:   04/06/16 2025 04/06/16 2109 04/07/16 0531 04/07/16 1405  BP:  100/62 101/66 107/70  Pulse:  (!) 132 (!) 113 (!) 120  Resp:  (!) 22 18 18   Temp:  100 F (37.8 C) 100 F (37.8 C) 99.4 F (37.4 C)  TempSrc:  Oral Oral Oral  SpO2: 99% 96% 98% 98%  Weight:      Height:       Body mass index is 16.4 kg/m.  Physical Exam  Constitutional: No distress.  Very groggy.  Cardiovascular: Normal rate and regular rhythm.   No murmur heard. Pulmonary/Chest: Effort normal and breath sounds normal. She has no wheezes. She has no rales.  Abdominal: Soft. There is no tenderness.  Skin: No rash noted.    Lab Results Lab Results  Component Value Date   WBC 8.6 04/07/2016   HGB 9.8 (L) 04/07/2016   HCT 31.9 (L) 04/07/2016   MCV 82.4 04/07/2016  PLT 215 04/07/2016    Lab Results  Component Value Date   CREATININE 0.62 04/07/2016   BUN 8 04/07/2016   NA 138 04/07/2016   K 3.5 04/07/2016   CL 104 04/07/2016   CO2 25 04/07/2016    Lab Results  Component Value Date   ALT 13 (L) 04/04/2016   AST 16 04/04/2016   ALKPHOS 71 04/04/2016   BILITOT 0.4 04/04/2016     Microbiology: Recent Results (from the past 240 hour(s))  Blood Culture (routine x 2)     Status: None (Preliminary result)   Collection Time: 04/03/16 12:27 AM  Result Value Ref Range Status   Specimen Description BLOOD RIGHT ANTECUBITAL  Final   Special Requests BOTTLES DRAWN AEROBIC AND ANAEROBIC 5CC  Final   Culture NO GROWTH 4 DAYS  Final   Report Status PENDING  Incomplete  Urine culture     Status: Abnormal   Collection Time: 04/03/16  1:34 AM  Result Value Ref Range Status   Specimen Description URINE, CLEAN CATCH  Final   Special Requests NONE  Final   Culture <10,000 COLONIES/mL INSIGNIFICANT GROWTH (A)  Final   Report Status  04/04/2016 FINAL  Final  Culture, sputum-assessment     Status: None   Collection Time: 04/03/16  2:21 AM  Result Value Ref Range Status   Specimen Description EXPECTORATED SPUTUM  Final   Special Requests NONE  Final   Sputum evaluation THIS SPECIMEN IS ACCEPTABLE FOR SPUTUM CULTURE  Final   Report Status 04/04/2016 FINAL  Final  Culture, respiratory (NON-Expectorated)     Status: None   Collection Time: 04/03/16  2:21 AM  Result Value Ref Range Status   Specimen Description EXPECTORATED SPUTUM  Final   Special Requests NONE Reflexed from [id number]  Final   Gram Stain   Final    FEW WBC PRESENT, PREDOMINANTLY PMN FEW BUDDING YEAST SEEN FEW GRAM POSITIVE RODS    Culture Consistent with normal respiratory flora.  Final   Report Status 04/06/2016 FINAL  Final  Respiratory Panel by PCR     Status: None   Collection Time: 04/03/16  4:06 AM  Result Value Ref Range Status   Adenovirus NOT DETECTED NOT DETECTED Final   Coronavirus 229E NOT DETECTED NOT DETECTED Final   Coronavirus HKU1 NOT DETECTED NOT DETECTED Final   Coronavirus NL63 NOT DETECTED NOT DETECTED Final   Coronavirus OC43 NOT DETECTED NOT DETECTED Final   Metapneumovirus NOT DETECTED NOT DETECTED Final   Rhinovirus / Enterovirus NOT DETECTED NOT DETECTED Final   Influenza A NOT DETECTED NOT DETECTED Final   Influenza B NOT DETECTED NOT DETECTED Final   Parainfluenza Virus 1 NOT DETECTED NOT DETECTED Final   Parainfluenza Virus 2 NOT DETECTED NOT DETECTED Final   Parainfluenza Virus 3 NOT DETECTED NOT DETECTED Final   Parainfluenza Virus 4 NOT DETECTED NOT DETECTED Final   Respiratory Syncytial Virus NOT DETECTED NOT DETECTED Final   Bordetella pertussis NOT DETECTED NOT DETECTED Final   Chlamydophila pneumoniae NOT DETECTED NOT DETECTED Final   Mycoplasma pneumoniae NOT DETECTED NOT DETECTED Final  MRSA PCR Screening     Status: None   Collection Time: 04/03/16  4:07 AM  Result Value Ref Range Status   MRSA by PCR  NEGATIVE NEGATIVE Final    Comment:        The GeneXpert MRSA Assay (FDA approved for NASAL specimens only), is one component of a comprehensive MRSA colonization surveillance program. It is not intended to  diagnose MRSA infection nor to guide or monitor treatment for MRSA infections.   Blood Culture (routine x 2)     Status: None (Preliminary result)   Collection Time: 04/03/16  5:18 AM  Result Value Ref Range Status   Specimen Description BLOOD LEFT ANTECUBITAL  Final   Special Requests BOTTLES DRAWN AEROBIC AND ANAEROBIC 4CC  Final   Culture NO GROWTH 4 DAYS  Final   Report Status PENDING  Incomplete     ASSESSMENT: She has shown some improvement on empiric vancomycin and cefepime and is afebrile. I will continue her IV therapy through the weekend. I will switch her to oral trimethoprim sulfamethoxazole on completion of IV antibiotic therapy. Her previous staph aureus and Serratia isolate its are both sensitive to trimethoprim sulfamethoxazole.  PLAN: 1. Continue vancomycin and cefepime for 2 more days 2. Please call me for any infectious disease questions this weekend  Cliffton AstersJohn Lamontae Ricardo, MD [facility] [phone] pager   [phone] cell 04/07/2016, 5:58 PM

## 2016-04-07 NOTE — Progress Notes (Signed)
Pharmacy Antibiotic Note  Catherine Hernandez is a 31 y.o. female admitted on 04/02/2016 with pneumonia and endocarditis.  Pharmacy has been consulted for Vancomycin dosing.  1/5: Vancomycin trough this AM is low at 4  Plan: -Inc vancomycin to 1000 mg IV q12h -Re-check trough at steady state  Height: 5\' 5"  (165.1 cm) Weight: 98 lb 8.7 oz (44.7 kg) IBW/kg (Calculated) : 57  Temp (24hrs), Avg:99.6 F (37.6 C), Min:97.8 F (36.6 C), Max:100.9 F (38.3 C)   Recent Labs Lab 04/03/16 0021 04/03/16 0037 04/03/16 0518 04/03/16 0842 04/04/16 0530 04/05/16 0745 04/07/16 0336  WBC 14.9*  --   --   --  9.7 6.1 8.6  CREATININE 0.77  --   --   --  0.71 0.59 0.62  LATICACIDVEN  --  1.65 1.2 1.2  --   --   --   VANCOTROUGH  --   --   --   --   --   --  4*    Estimated Creatinine Clearance: 73.9 mL/min (by C-G formula based on SCr of 0.62 mg/dL).    No Known Allergies  Catherine Hernandez, Catherine Hernandez 04/07/2016 5:10 AM

## 2016-04-07 NOTE — Progress Notes (Signed)
Patient ID: Catherine Hernandez, female   DOB: [birth year], 31 y.o.   MRN: [id number]  PROGRESS NOTE    Catherine Hernandez  GDJ:[id number] DOB: 11-Dec-1985 DOA: 04/02/2016  PCP: No PCP Per Patient   Brief Narrative:   31 y.o. WF PMHX polysubstance abuse (heroin, opioid-dependence, cocaine, tobacco), asthma, anxiety, Serratia Bacteremia, multifocal pneumonia with cavitation, septic emboli, endocarditis, PE on Eliquis. Pt presented with cough productive of yellowish sputum and shortness of breath for past day or so prior to this admission. She also has some chest pan in the middle of the chest, 10/10, non radiating, constant and worse with breathing. She was started on vanco and cefepime for sepsis due to HCAP.  Assessment & Plan:   Sepsis due to HCAP, with hx of serratia bacteremia, multifocal pneumonia with cavitation andseptic emboli - Sepsis criteria met with leukocytosis, tachycardia and tachypnea. Lactate is normal.  - Hemodynamically stable at this time and does not require pressor support - Per previous discharge summary, "pt is not a candidate for home PICC line. 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 CT surgery was consulted. Patient was seen by Dr. Nils Pyle who felt that patient would not survive heartfelt surgery and should be treated with antibiotics with emphasis on comfort care." Pt was subsequently discharged on Cipro for one month per ID, Dr. Tommy Medal. - Pt is currently on vanco and cefepime  - Blood cultures, respiratory culture, respiratory virus panel and urine culture all negative - Steroids stopped  TV vegetation/severe regurgitation - On last admission cardiothoracic surgery stated patient not a candidate for surgery.  Pulmonary hypertension - Continue conservative treatment  Bilateral Pulmonary embolism:  - Continue Eliquis  Polysubstance abuse: - 1/1  UDS positive for cocaine, benzodiazepine, marijuana - Counseled - Nicotine patch ordered  Drug-seeking behavior - As soon as patient wakes up requests IV narcotics and benzodiazepine. DO NOT PROVIDE NARCOTICS.  Cocaine/Benzodiazepine withdrawal - Continue CIWA protocol  Anemia of chronic disease and iron deficiency anemia:  - Status post 1 unit of PRBC transfusion on 04/04/2016 - Hemoglobin 9.8, overall stable  Severe Protein calorie malnutrition  - In the context of chronic illness - Continue nutritional supplementation  Goals of care - Appreciate palliative care team seeing the pt in consultation    DVT prophylaxis: On anticoagulation with apixaban  Code Status: full code  Family Communication: no family at the bedside this am Disposition Plan: Discharge in 48 hours. Prior to discharge ID will switch patient to oral trimethoprim sulfamethoxazole    Consultants:   Palliative care   Cultures 12/19 Hepatitis C antibody positive 12/19 HIV negative 1/1 blood NGTD 1/1 urine insignificant growth 1/1 MRSA by PCR negative 1/1 strep pneumo/Legionella urine antigen negative 1/2 induction sputum normal flora 1/2 Hepatitis C RNA negative  Procedures:   4/19 Echocardiogram: LVEF=: 65% to 70%. - Tricuspid valve: Large 1 cm mobile vegetation on the anterior leaflet of the tricuspid valve with severe tricuspid regurgitation, consistent with vegetation. Severeregurgitation. - - Pulmonary arteries: PA peak pressure: 41 mm Hg (S). - Inferior vena cava: The vessel was dilated.  12/20 CT Angiogram chest:--Bilateral lower lobe pulmonary emboli with tapered appearance, this can be seen with chronic pulmonary emboli or possibly vasculopathy secondary to septic emboli. --Multiple cavitating and non cavitating pneumonia/consolidation of varying ages consistent with septic emboli. --Mild cardiomegaly and dilated RIGHT atrium. --Suspected splenomegaly.  1/2 transfuse 1 unit  PRBC  Antimicrobials:   Vanco 04/03/2016 -->  Cefepime 04/03/2016 -->   Subjective: No overnight events.   Objective: Vitals:   04/06/16 1412 04/06/16 2025 04/06/16 2109 04/07/16 0531  BP: 101/71  100/62 101/66  Pulse: (!) 108  (!) 132 (!) 113  Resp: (!) 22  (!) 22 18  Temp: 97.8 F (36.6 C)  100 F (37.8 C) 100 F (37.8 C)  TempSrc: Oral  Oral Oral  SpO2: 99% 99% 96% 98%  Weight:      Height:        Intake/Output Summary (Last 24 hours) at 04/07/16 1040 Last data filed at 04/07/16 1025  Gross per 24 hour  Intake              960 ml  Output                0 ml  Net              960 ml   Filed Weights   04/03/16 0727  Weight: 44.7 kg (98 lb 8.7 oz)    Examination:  General exam: Appears calm and comfortable  Respiratory system: Clear to auscultation. Respiratory effort normal. Cardiovascular system: S1 & S2 heard, RRR. No JVD, murmurs, rubs, gallops or clicks. No pedal edema. Gastrointestinal system: Abdomen is nondistended, soft and nontender. No organomegaly or masses felt. Normal bowel sounds heard. Central nervous system: Alert and oriented. No focal neurological deficits. Extremities: Symmetric 5 x 5 power. Skin: No rashes, lesions or ulcers Psychiatry: Judgement and insight appear normal. Mood & affect appropriate.   Data Reviewed: I have personally reviewed following labs and imaging studies  CBC:  Recent Labs Lab 04/03/16 0021 04/04/16 0530 04/05/16 0745 04/07/16 0336  WBC 14.9* 9.7 6.1 8.6  NEUTROABS 10.1*  --  4.6 5.5  HGB 9.3* 7.1* 9.3* 9.8*  HCT 29.9* 23.4* 29.8* 31.9*  MCV 80.6 82.7 82.5 82.4  PLT 336 205 187 311   Basic Metabolic Panel:  Recent Labs Lab 04/03/16 0021 04/04/16 0530 04/05/16 0745 04/07/16 0336  NA 135 134* 139 138  K 3.6 3.9 4.3 3.5  CL 98* 105 105 104  CO2 _0 GLUCOSE 111* 114* 143* 85  BUN 14 <5* 9 8  CREATININE 0.77 0.71 0.59 0.62  CALCIUM 8.6* 7.8* 8.5* 7.9*  MG  --   --  2.1 1.7    GFR: Estimated Creatinine Clearance: 73.9 mL/min (by C-G formula based on SCr of 0.62 mg/dL). Liver Function Tests:  Recent Labs Lab 04/03/16 0021 04/04/16 0530  AST 22 16  ALT 20 13*  ALKPHOS 111 71  BILITOT 0.5 0.4  PROT 8.7* 6.6  ALBUMIN 2.3* 1.7*   No results for input(s): LIPASE, AMYLASE in the last 168 hours. No results for input(s): AMMONIA in the last 168 hours. Coagulation Profile:  Recent Labs Lab 04/03/16 0518  INR 1.25   Cardiac Enzymes: No results for input(s): CKTOTAL, CKMB, CKMBINDEX, TROPONINI in the last 168 hours. BNP (last 3 results) No results for input(s): PROBNP in the last 8760 hours. HbA1C: No results for input(s): HGBA1C in the last 72 hours. CBG: No results for input(s): GLUCAP in the last 168 hours. Lipid Profile: No results for input(s): CHOL, HDL, LDLCALC, TRIG, CHOLHDL, LDLDIRECT in the last 72 hours. Thyroid Function Tests: No results for input(s): TSH, T4TOTAL, FREET4, T3FREE, THYROIDAB in the last 72 hours. Anemia Panel: No results for input(s): VITAMINB12, FOLATE, FERRITIN, TIBC, IRON, RETICCTPCT in the last 72 hours.  Urine analysis:    Component Value Date/Time   COLORURINE YELLOW 04/03/2016 0134   APPEARANCEUR HAZY (A) 04/03/2016 0134   LABSPEC 1.015 04/03/2016 0134   PHURINE 6.0 04/03/2016 0134   GLUCOSEU NEGATIVE 04/03/2016 0134   HGBUR LARGE (A) 04/03/2016 0134   BILIRUBINUR NEGATIVE 04/03/2016 0134   KETONESUR NEGATIVE 04/03/2016 0134   PROTEINUR 30 (A) 04/03/2016 0134   UROBILINOGEN 0.2 01/07/2012 1140   NITRITE NEGATIVE 04/03/2016 0134   LEUKOCYTESUR NEGATIVE 04/03/2016 0134   Sepsis Labs: _0 (procalcitonin:4,lacticidven:4)    Blood Culture (routine x 2)     Status: None (Preliminary result)   Collection Time: 04/03/16 12:27 AM  Result Value Ref Range Status   Specimen Description BLOOD RIGHT ANTECUBITAL  Final   Special Requests BOTTLES DRAWN AEROBIC AND ANAEROBIC 5CC  Final   Culture NO GROWTH 3  DAYS  Final   Report Status PENDING  Incomplete  Urine culture     Status: Abnormal   Collection Time: 04/03/16  1:34 AM  Result Value Ref Range Status   Specimen Description URINE, CLEAN CATCH  Final   Special Requests NONE  Final   Culture <10,000 COLONIES/mL INSIGNIFICANT GROWTH (A)  Final   Report Status 04/04/2016 FINAL  Final  Culture, sputum-assessment     Status: None   Collection Time: 04/03/16  2:21 AM  Result Value Ref Range Status   Specimen Description EXPECTORATED SPUTUM  Final   Special Requests NONE  Final   Sputum evaluation THIS SPECIMEN IS ACCEPTABLE FOR SPUTUM CULTURE  Final   Report Status 04/04/2016 FINAL  Final  Culture, respiratory (NON-Expectorated)     Status: None   Collection Time: 04/03/16  2:21 AM  Result Value Ref Range Status   Specimen Description EXPECTORATED SPUTUM  Final   Special Requests NONE Reflexed from [id number]  Final   Culture Consistent with normal respiratory flora.  Final   Report Status 04/06/2016 FINAL  Final  Respiratory Panel by PCR     Status: None   Collection Time: 04/03/16  4:06 AM  Result Value Ref Range Status   Adenovirus NOT DETECTED NOT DETECTED Final   Coronavirus 229E NOT DETECTED NOT DETECTED Final   Coronavirus HKU1 NOT DETECTED NOT DETECTED Final   Coronavirus NL63 NOT DETECTED NOT DETECTED Final   Coronavirus OC43 NOT DETECTED NOT DETECTED Final   Metapneumovirus NOT DETECTED NOT DETECTED Final   Rhinovirus / Enterovirus NOT DETECTED NOT DETECTED Final   Influenza A NOT DETECTED NOT DETECTED Final   Influenza B NOT DETECTED NOT DETECTED Final   Parainfluenza Virus 1 NOT DETECTED NOT DETECTED Final   Parainfluenza Virus 2 NOT DETECTED NOT DETECTED Final   Parainfluenza Virus 3 NOT DETECTED NOT DETECTED Final   Parainfluenza Virus 4 NOT DETECTED NOT DETECTED Final   Respiratory Syncytial Virus NOT DETECTED NOT DETECTED Final   Bordetella pertussis NOT DETECTED NOT DETECTED Final   Chlamydophila pneumoniae NOT  DETECTED NOT DETECTED Final   Mycoplasma pneumoniae NOT DETECTED NOT DETECTED Final  MRSA PCR Screening     Status: None   Collection Time: 04/03/16  4:07 AM  Result Value Ref Range Status   MRSA by PCR NEGATIVE NEGATIVE Final  Blood Culture (routine x 2)     Status: None (Preliminary result)   Collection Time: 04/03/16  5:18 AM  Result Value Ref Range Status   Specimen Description BLOOD LEFT ANTECUBITAL  Final   Special Requests BOTTLES DRAWN AEROBIC AND ANAEROBIC 4CC  Final   Culture NO GROWTH  3 DAYS  Final   Report Status PENDING  Incomplete      Radiology Studies: No results found.   Scheduled Meds: . apixaban  5 mg Oral BID  . ceFEPime (MAXIPIME) IV  1 g Intravenous Q8H  . dextromethorphan-guaiFENesin  1 tablet Oral BID  . feeding supplement (ENSURE ENLIVE)  237 mL Oral BID BM  . ferrous gluconate  324 mg Oral TID WC  . folic acid  1 mg Oral Daily  . multivitamin with minerals  1 tablet Oral Daily  . nicotine  21 mg Transdermal Daily  . thiamine  100 mg Oral Daily  . vancomycin  1,000 mg Intravenous Q12H   Continuous Infusions:   LOS: 4 days    Time spent: 25 minutes  Greater than 50% of the time spent on counseling and coordinating the care.   Catherine Lenz, MD [facility] Pager [phone]  If 7PM-7AM, please contact night-coverage [email or web address] Password [email or web address] 04/07/2016, 10:40 AM

## 2016-04-08 DIAGNOSIS — F1910 Other psychoactive substance abuse, uncomplicated: Secondary | ICD-10-CM

## 2016-04-08 LAB — GLUCOSE, CAPILLARY: Glucose-Capillary: 285 mg/dL — ABNORMAL HIGH (ref 65–99)

## 2016-04-08 LAB — CULTURE, BLOOD (ROUTINE X 2)
Culture: NO GROWTH
Culture: NO GROWTH

## 2016-04-08 LAB — BASIC METABOLIC PANEL
Anion gap: 8 (ref 5–15)
BUN: 6 mg/dL (ref 6–20)
CALCIUM: 7.8 mg/dL — AB (ref 8.9–10.3)
CHLORIDE: 102 mmol/L (ref 101–111)
CO2: 25 mmol/L (ref 22–32)
CREATININE: 0.57 mg/dL (ref 0.44–1.00)
GFR calc Af Amer: >60 mL/min (ref >60)
GFR calc non Af Amer: >60 mL/min (ref >60)
GLUCOSE: 167 mg/dL — AB (ref 65–99)
Potassium: 3.3 mmol/L — ABNORMAL LOW (ref 3.5–5.1)
Sodium: 135 mmol/L (ref 135–145)

## 2016-04-08 LAB — MAGNESIUM: Magnesium: 1.7 mg/dL (ref 1.7–2.4)

## 2016-04-08 LAB — CBC
HEMATOCRIT: 27.7 % — AB (ref 36.0–46.0)
Hemoglobin: 8.4 g/dL — ABNORMAL LOW (ref 12.0–15.0)
MCH: 25.2 pg — AB (ref 26.0–34.0)
MCHC: 30.3 g/dL (ref 30.0–36.0)
MCV: 83.2 fL (ref 78.0–100.0)
PLATELETS: 206 10*3/uL (ref 150–400)
RBC: 3.33 MIL/uL — AB (ref 3.87–5.11)
RDW: 19.5 % — AB (ref 11.5–15.5)
WBC: 11.5 10*3/uL — AB (ref 4.0–10.5)

## 2016-04-08 NOTE — Progress Notes (Signed)
Patient ID: Catherine Hernandez, female   DOB: [birth year], 31 y.o.   MRN: [id number]  PROGRESS NOTE    Catherine Hernandez  QVZ:[id number] DOB: [birth year] DOA: 04/02/2016  PCP: No PCP Per Patient   Brief Narrative:   31 y.o. WF PMHX polysubstance abuse (heroin, opioid-dependence, cocaine, tobacco), asthma, anxiety, Serratia Bacteremia, multifocal pneumonia with cavitation, septic emboli, endocarditis, PE on Eliquis. Pt presented with cough productive of yellowish sputum and shortness of breath for past day or so prior to this admission. She also has some chest pan in the middle of the chest, 10/10, non radiating, constant and worse with breathing. She was started on vanco and cefepime for sepsis due to HCAP.  Assessment & Plan:   Sepsis due to HCAP, with hx of serratia bacteremia, multifocal pneumonia with cavitation andseptic emboli - Sepsis criteria met with leukocytosis, tachycardia and tachypnea. Lactate is normal.  - Hemodynamically stable - Per previous discharge summary, "pt is not a candidate for home PICC line. 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 CT surgery was consulted. Patient was seen by Dr. Nils Pyle who felt that patient would not survive heartfelt surgery and should be treated with antibiotics with emphasis on comfort care." Pt was subsequently discharged on Cipro for one month per ID, Dr. Tommy Medal. - Continue vanco and cefepime  - Blood cultures, respiratory culture, respiratory virus panel and urine culture all negative - Steroids stopped  TV vegetation/severe regurgitation - On last admission cardiothoracic surgery stated patient not a candidate for surgery.  Pulmonary hypertension - Continue conservative treatment  Bilateral Pulmonary embolism:  - Continue Eliquis  Polysubstance abuse: - 1/1 UDS positive for cocaine, benzodiazepine, marijuana -  Counseled - Nicotine patch ordered  Drug-seeking behavior - As soon as patient wakes up requests IV narcotics and benzodiazepine. DO NOT PROVIDE NARCOTICS.  Cocaine/Benzodiazepine withdrawal - Continue CIWA protocol - No reports of withdrawals   Anemia of chronic disease and iron deficiency anemia:  - Status post 1 unit of PRBC transfusion on 04/04/2016 - Hemoglobin 8.4 this am - Check CBC in am  Severe Protein calorie malnutrition  - In the context of chronic illness - Continue nutritional supplementation  Goals of care - Appreciate palliative care team following    DVT prophylaxis: On anticoagulation with apixaban  Code Status: full code  Family Communication: no family at the bedside this am Disposition Plan: Discharge in 48 hours. Prior to discharge ID will switch patient to oral trimethoprim sulfamethoxazole    Consultants:   Palliative care   Cultures 12/19 Hepatitis C antibody positive 12/19 HIV negative 1/1 blood NGTD 1/1 urine insignificant growth 1/1 MRSA by PCR negative 1/1 strep pneumo/Legionella urine antigen negative 1/2 induction sputum normal flora 1/2 Hepatitis C RNA negative  Procedures:   4/19 Echocardiogram: LVEF=: 65% to 70%. - Tricuspid valve: Large 1 cm mobile vegetation on the anterior leaflet of the tricuspid valve with severe tricuspid regurgitation, consistent with vegetation. Severeregurgitation. - - Pulmonary arteries: PA peak pressure: 41 mm Hg (S). - Inferior vena cava: The vessel was dilated.  12/20 CT Angiogram chest:--Bilateral lower lobe pulmonary emboli with tapered appearance, this can be seen with chronic pulmonary emboli or possibly vasculopathy secondary to septic emboli. --Multiple cavitating and non cavitating pneumonia/consolidation of varying ages consistent with septic emboli. --Mild cardiomegaly and dilated RIGHT atrium. --Suspected splenomegaly.  1/2 transfuse 1 unit PRBC  Antimicrobials:   Vanco 04/03/2016  -->  Cefepime 04/03/2016 -->   Subjective: No overnight events.   Objective: Vitals:   04/07/16 1405 04/07/16 1900 04/07/16 2030 04/08/16 0526  BP: 107/70 113/70 111/70 107/73  Pulse: (!) 120 (!) 122 (!) 117 (!) 102  Resp: 18 (!) '21 18 16  '$ Temp: 99.4 F (37.4 C) 100.1 F (37.8 C) 100 F (37.8 C) 99.2 F (37.3 C)  TempSrc: Oral Oral Oral Oral  SpO2: 98% 95% 98% 98%  Weight:      Height:        Intake/Output Summary (Last 24 hours) at 04/08/16 1044 Last data filed at 04/07/16 1630  Gross per 24 hour  Intake              960 ml  Output             1201 ml  Net             -241 ml   Filed Weights   04/03/16 0727  Weight: 44.7 kg (98 lb 8.7 oz)    Examination:  General exam: No distress  Respiratory system: No wheezing, no rhonchi  Cardiovascular system: S1 & S2 heard, Rate controlled  Gastrointestinal system: No tenderness, (+) BS Central nervous system: No focal neurological deficits. Extremities: No edema, palpable pulses  Skin: warm and dry  Psychiatry: Normal mood and behavior   Data Reviewed: I have personally reviewed following labs and imaging studies  CBC:  Recent Labs Lab 04/03/16 0021 04/04/16 0530 04/05/16 0745 04/07/16 0336 04/08/16 0648  WBC 14.9* 9.7 6.1 8.6 11.5*  NEUTROABS 10.1*  --  4.6 5.5  --   HGB 9.3* 7.1* 9.3* 9.8* 8.4*  HCT 29.9* 23.4* 29.8* 31.9* 27.7*  MCV 80.6 82.7 82.5 82.4 83.2  PLT 336 205 187 215 785   Basic Metabolic Panel:  Recent Labs Lab 04/03/16 0021 04/04/16 0530 04/05/16 0745 04/07/16 0336 04/08/16 0648  NA 135 134* 139 138 135  K 3.6 3.9 4.3 3.5 3.3*  CL 98* 105 105 104 102  CO2 '25 23 22 25 25  '$ GLUCOSE 111* 114* 143* 85 167*  BUN 14 <5* '9 8 6  '$ CREATININE 0.77 0.71 0.59 0.62 0.57  CALCIUM 8.6* 7.8* 8.5* 7.9* 7.8*  MG  --   --  2.1 1.7 1.7   GFR: Estimated Creatinine Clearance: 73.9 mL/min (by C-G formula based on SCr of 0.57 mg/dL). Liver Function Tests:  Recent Labs Lab 04/03/16 0021  04/04/16 0530  AST 22 16  ALT 20 13*  ALKPHOS 111 71  BILITOT 0.5 0.4  PROT 8.7* 6.6  ALBUMIN 2.3* 1.7*   No results for input(s): LIPASE, AMYLASE in the last 168 hours. No results for input(s): AMMONIA in the last 168 hours. Coagulation Profile:  Recent Labs Lab 04/03/16 0518  INR 1.25   Cardiac Enzymes: No results for input(s): CKTOTAL, CKMB, CKMBINDEX, TROPONINI in the last 168 hours. BNP (last 3 results) No results for input(s): PROBNP in the last 8760 hours. HbA1C: No results for input(s): HGBA1C in the last 72 hours. CBG: No results for input(s): GLUCAP in the last 168 hours. Lipid Profile: No results for input(s): CHOL, HDL, LDLCALC, TRIG, CHOLHDL, LDLDIRECT in the last 72 hours. Thyroid Function Tests: No results for input(s): TSH, T4TOTAL, FREET4, T3FREE, THYROIDAB in the last 72 hours. Anemia Panel: No results for input(s): VITAMINB12, FOLATE, FERRITIN, TIBC, IRON, RETICCTPCT in the last 72 hours. Urine analysis:    Component Value Date/Time   COLORURINE YELLOW 04/03/2016 0134   APPEARANCEUR HAZY (  A) 04/03/2016 0134   LABSPEC 1.015 04/03/2016 0134   PHURINE 6.0 04/03/2016 0134   GLUCOSEU NEGATIVE 04/03/2016 0134   HGBUR LARGE (A) 04/03/2016 0134   BILIRUBINUR NEGATIVE 04/03/2016 0134   KETONESUR NEGATIVE 04/03/2016 0134   PROTEINUR 30 (A) 04/03/2016 0134   UROBILINOGEN 0.2 01/07/2012 1140   NITRITE NEGATIVE 04/03/2016 0134   LEUKOCYTESUR NEGATIVE 04/03/2016 0134   Sepsis Labs: '@LABRCNTIP'$ (procalcitonin:4,lacticidven:4)    Blood Culture (routine x 2)     Status: None (Preliminary result)   Collection Time: 04/03/16 12:27 AM  Result Value Ref Range Status   Specimen Description BLOOD RIGHT ANTECUBITAL  Final   Special Requests BOTTLES DRAWN AEROBIC AND ANAEROBIC 5CC  Final   Culture NO GROWTH 3 DAYS  Final   Report Status PENDING  Incomplete  Urine culture     Status: Abnormal   Collection Time: 04/03/16  1:34 AM  Result Value Ref Range Status    Specimen Description URINE, CLEAN CATCH  Final   Special Requests NONE  Final   Culture <10,000 COLONIES/mL INSIGNIFICANT GROWTH (A)  Final   Report Status 04/04/2016 FINAL  Final  Culture, sputum-assessment     Status: None   Collection Time: 04/03/16  2:21 AM  Result Value Ref Range Status   Specimen Description EXPECTORATED SPUTUM  Final   Special Requests NONE  Final   Sputum evaluation THIS SPECIMEN IS ACCEPTABLE FOR SPUTUM CULTURE  Final   Report Status 04/04/2016 FINAL  Final  Culture, respiratory (NON-Expectorated)     Status: None   Collection Time: 04/03/16  2:21 AM  Result Value Ref Range Status   Specimen Description EXPECTORATED SPUTUM  Final   Special Requests NONE Reflexed from [id number]  Final   Culture Consistent with normal respiratory flora.  Final   Report Status 04/06/2016 FINAL  Final  Respiratory Panel by PCR     Status: None   Collection Time: 04/03/16  4:06 AM  Result Value Ref Range Status   Adenovirus NOT DETECTED NOT DETECTED Final   Coronavirus 229E NOT DETECTED NOT DETECTED Final   Coronavirus HKU1 NOT DETECTED NOT DETECTED Final   Coronavirus NL63 NOT DETECTED NOT DETECTED Final   Coronavirus OC43 NOT DETECTED NOT DETECTED Final   Metapneumovirus NOT DETECTED NOT DETECTED Final   Rhinovirus / Enterovirus NOT DETECTED NOT DETECTED Final   Influenza A NOT DETECTED NOT DETECTED Final   Influenza B NOT DETECTED NOT DETECTED Final   Parainfluenza Virus 1 NOT DETECTED NOT DETECTED Final   Parainfluenza Virus 2 NOT DETECTED NOT DETECTED Final   Parainfluenza Virus 3 NOT DETECTED NOT DETECTED Final   Parainfluenza Virus 4 NOT DETECTED NOT DETECTED Final   Respiratory Syncytial Virus NOT DETECTED NOT DETECTED Final   Bordetella pertussis NOT DETECTED NOT DETECTED Final   Chlamydophila pneumoniae NOT DETECTED NOT DETECTED Final   Mycoplasma pneumoniae NOT DETECTED NOT DETECTED Final  MRSA PCR Screening     Status: None   Collection Time: 04/03/16  4:07 AM   Result Value Ref Range Status   MRSA by PCR NEGATIVE NEGATIVE Final  Blood Culture (routine x 2)     Status: None (Preliminary result)   Collection Time: 04/03/16  5:18 AM  Result Value Ref Range Status   Specimen Description BLOOD LEFT ANTECUBITAL  Final   Special Requests BOTTLES DRAWN AEROBIC AND ANAEROBIC 4CC  Final   Culture NO GROWTH 3 DAYS  Final   Report Status PENDING  Incomplete      Radiology Studies:  No results found.   Scheduled Meds: . apixaban  5 mg Oral BID  . ceFEPime (MAXIPIME) IV  1 g Intravenous Q8H  . dextromethorphan-guaiFENesin  1 tablet Oral BID  . feeding supplement (ENSURE ENLIVE)  237 mL Oral BID BM  . ferrous gluconate  324 mg Oral TID WC  . folic acid  1 mg Oral Daily  . multivitamin with minerals  1 tablet Oral Daily  . nicotine  21 mg Transdermal Daily  . thiamine  100 mg Oral Daily  . vancomycin  1,000 mg Intravenous Q12H   Continuous Infusions:   LOS: 5 days    Time spent: 15 minutes  Greater than 50% of the time spent on counseling and coordinating the care.   Leisa Lenz, MD [facility] Pager [phone]  If 7PM-7AM, please contact night-coverage [email or web address] Password [email or web address] 04/08/2016, 10:44 AM

## 2016-04-09 ENCOUNTER — Emergency Department (HOSPITAL_COMMUNITY): Payer: Medicaid Other

## 2016-04-09 DIAGNOSIS — Z8701 Personal history of pneumonia (recurrent): Secondary | ICD-10-CM | POA: Insufficient documentation

## 2016-04-09 DIAGNOSIS — R079 Chest pain, unspecified: Secondary | ICD-10-CM | POA: Insufficient documentation

## 2016-04-09 DIAGNOSIS — F1990 Other psychoactive substance use, unspecified, uncomplicated: Secondary | ICD-10-CM

## 2016-04-09 DIAGNOSIS — Z7901 Long term (current) use of anticoagulants: Secondary | ICD-10-CM | POA: Insufficient documentation

## 2016-04-09 DIAGNOSIS — R0602 Shortness of breath: Secondary | ICD-10-CM | POA: Diagnosis not present

## 2016-04-09 DIAGNOSIS — Z8679 Personal history of other diseases of the circulatory system: Secondary | ICD-10-CM | POA: Insufficient documentation

## 2016-04-09 DIAGNOSIS — D649 Anemia, unspecified: Secondary | ICD-10-CM

## 2016-04-09 DIAGNOSIS — F17210 Nicotine dependence, cigarettes, uncomplicated: Secondary | ICD-10-CM | POA: Insufficient documentation

## 2016-04-09 DIAGNOSIS — R7881 Bacteremia: Secondary | ICD-10-CM

## 2016-04-09 LAB — CBC
HEMATOCRIT: 29.9 % — AB (ref 36.0–46.0)
Hemoglobin: 9.1 g/dL — ABNORMAL LOW (ref 12.0–15.0)
MCH: 25.7 pg — ABNORMAL LOW (ref 26.0–34.0)
MCHC: 30.4 g/dL (ref 30.0–36.0)
MCV: 84.5 fL (ref 78.0–100.0)
PLATELETS: 200 10*3/uL (ref 150–400)
RBC: 3.54 MIL/uL — ABNORMAL LOW (ref 3.87–5.11)
RDW: 20.2 % — AB (ref 11.5–15.5)
WBC: 10.4 10*3/uL (ref 4.0–10.5)

## 2016-04-09 LAB — BASIC METABOLIC PANEL
Anion gap: 8 (ref 5–15)
BUN: 8 mg/dL (ref 6–20)
CALCIUM: 8.2 mg/dL — AB (ref 8.9–10.3)
CO2: 28 mmol/L (ref 22–32)
CREATININE: 0.62 mg/dL (ref 0.44–1.00)
Chloride: 105 mmol/L (ref 101–111)
GLUCOSE: 95 mg/dL (ref 65–99)
Potassium: 3.6 mmol/L (ref 3.5–5.1)
Sodium: 141 mmol/L (ref 135–145)

## 2016-04-09 LAB — GLUCOSE, CAPILLARY: Glucose-Capillary: 111 mg/dL — ABNORMAL HIGH (ref 65–99)

## 2016-04-09 LAB — MAGNESIUM: MAGNESIUM: 2 mg/dL (ref 1.7–2.4)

## 2016-04-09 MED ORDER — SULFAMETHOXAZOLE-TRIMETHOPRIM 800-160 MG PO TABS
1.0000 | ORAL_TABLET | Freq: Two times a day (BID) | ORAL | Status: DC
  Administered 2016-04-09: 1 via ORAL
  Filled 2016-04-09: qty 1

## 2016-04-09 MED ORDER — ACETAMINOPHEN 325 MG PO TABS: 650.0000 mg | ORAL_TABLET | Freq: Four times a day (QID) | ORAL | 0 refills | Status: AC | PRN

## 2016-04-09 MED ORDER — LORAZEPAM 1 MG PO TABS
1.0000 mg | ORAL_TABLET | Freq: Four times a day (QID) | ORAL | Status: DC | PRN
  Administered 2016-04-09: 1 mg via ORAL
  Filled 2016-04-09: qty 1

## 2016-04-09 MED ORDER — THIAMINE HCL 100 MG PO TABS: 100.0000 mg | ORAL_TABLET | Freq: Every day | ORAL | 0 refills | Status: AC

## 2016-04-09 MED ORDER — FOLIC ACID 1 MG PO TABS: 1.0000 mg | ORAL_TABLET | Freq: Every day | ORAL | 0 refills | Status: AC

## 2016-04-09 MED ORDER — SULFAMETHOXAZOLE-TRIMETHOPRIM 800-160 MG PO TABS
1.0000 | ORAL_TABLET | Freq: Two times a day (BID) | ORAL | 0 refills | Status: AC
Start: 2016-04-09 — End: ?

## 2016-04-09 NOTE — Progress Notes (Signed)
RN had paged MD  earlier in shift as patient lost IV access and patient is a known hard stick. Patient requesting PO ativan as she currently cannot have IV ativan. MD did not return page. RN called to room around 1230 by NT stating that patient wanted her discharge paperwork. RN informed patient that MD had not placed discharge orders yet for the patient, but MD had stated earlier in Am to nurse and patient that patient would likely be discharged today once the course of antibiotics were decided. Patient tearful and stated "i'll sign out if I have to, I want to leave." MD paged twice and notifying her [facility] MD called back and Rn explained situation. Ativan oral ordered and Rn administered to patient. MD also stated she would start the patient on bactrim and she was trying to get into contact with ID to find out how long the patient would need to be on bactrim. Patient notified of this. Patient currently calm, laying in bed.

## 2016-04-09 NOTE — Clinical Social Work Note (Signed)
CSW notified pt in need of bus pass to [place]. CSW provided bus pass. Pt has no other needs at this time. CSW is signing off.  Matix Henshaw B. Gean QuintBrown,MSW, LCSWA Clinical Social Work Dept Weekend Social Worker [phone] 3:27 PM

## 2016-04-09 NOTE — Progress Notes (Signed)
Patient verbalized understanding of discharge instructions and provided with prescriptions. Patient's meds that were locked in pharmacy were returned to patient. Social work supplied patient with two bus passes and patient instructed on where to catch the bus.

## 2016-04-09 NOTE — ED Triage Notes (Signed)
Per EMS: Pt recently admitted for endocarditis. Pt states d/c today. Pt presents today for chest pain. Pt also has nonproductive cough. Pt tachy and tachypneic at triage, afebrile. EMS gave 324 ASA enroute.

## 2016-04-09 NOTE — Discharge Summary (Signed)
Physician Discharge Summary  Catherine Hernandez OIN:[id number] DOB: 1985/06/23 DOA: 04/02/2016  PCP: No PCP Per Patient  Admit date: 04/02/2016 Discharge date: 04/09/2016  Recommendations for Outpatient Follow-up:  Continue Bactrim for 1 month on discharge and follow up with ID clinic in 2-3 weeks  Discharge Diagnoses:  Principal Problem:   Endocarditis of tricuspid valve Active Problems:   Sepsis ([place])   Opioid use disorder, severe, dependence ([place])   Enterococcal bacteremia   Staphylococcus aureus bacteremia   Normocytic anemia   Poor dentition   Protein-calorie malnutrition, severe ([place])   Polysubstance abuse   Symptomatic anemia   HCAP (healthcare-associated pneumonia)   Septic embolism ([facility])   Tobacco abuse   Serratia infection   IVDU (intravenous drug user)   Sepsis due to pneumonia [facility])    Discharge Condition: stable   Diet recommendation: as tolerated   History of present illness:  31 y.o. W[facility]X polysubstance abuse (heroin, opioid-dependence, cocaine, tobacco), asthma, anxiety, Serratia Bacteremia, multifocal pneumonia with cavitation, septic emboli, endocarditis, PE on Eliquis. Pt presented with cough productive of yellowish sputum and shortness of breath for past day or so prior to this admission. She also has some chest pan in the middle of the chest, 10/10, non radiating, constant and worse with breathing. She was started on vanco and cefepime for sepsis due to HCAP.  Hospital Course:  Assessment & Plan:   Sepsis due to HCAP, with hx of serratia bacteremia, multifocal pneumonia with cavitation andseptic emboli - Sepsis criteria met with leukocytosis, tachycardia and tachypnea. Lactate is normal.  - Hemodynamically stable - Per previous discharge summary, "pt is not a candidate for home PICC line. Concern for endocarditis with large 1 cm mobile mass noted on the anterior tricuspid leaflet with severe tricuspidregurgitation concerning for vegetation. CT surgery  was consulted. Patient was seen by Dr. Nils Pyle who felt that patient would not survive heartfelt surgery and should be treated with antibiotics with emphasis on comfort care." Pt was subsequently discharged on Cipro for one month per ID, Dr. Tommy Medal. - Continue vanco and cefepime through today prior to discharge and changed to bactrim for 1 month per ID recommendations - Blood cultures, respiratory culture, respiratory virus panel and urine culture all negative - Steroids stopped  TV vegetation/severe regurgitation - On last admission cardiothoracic surgery stated patient not a candidate for surgery.  Pulmonary hypertension - Continue conservative treatment  Bilateral Pulmonary embolism:  - Continue Eliquis  Polysubstance abuse: - 1/1 UDS positivefor cocaine, benzodiazepine, marijuana - Counseled - Nicotine patch ordered in hospital   Drug-seeking behavior - As soon as patient wakes up requests IV narcotics and benzodiazepine. DO NOT PROVIDE NARCOTICS.  Cocaine/Benzodiazepine withdrawal - No reports of withdrawals  - Continue thiamine, folic acid and multivitamin   Anemia of chronic disease and iron deficiency anemia: - Status post 1 unit of PRBC transfusion on 04/04/2016 - Hemoglobin 8.4, 9.1   Severe Protein calorie malnutrition  - In the context of chronic illness - Continue nutritional supplementation  Goals of care - Appreciate palliative care team following    DVT prophylaxis: On anticoagulation with apixaban  Code Status: full code  Family Communication: no family at the bedside this am Disposition Plan: Discharge in 48 hours. Prior to discharge ID will switch patient to oral trimethoprim sulfamethoxazole    Consultants:   Palliative care   Cultures 12/19 Hepatitis C antibody positive 12/19 HIV negative 1/1 blood NGTD 1/1 urine insignificant growth 1/1 MRSA by PCR negative 1/1 strep pneumo/Legionella  urine antigen negative 1/2  induction sputum normal flora 1/2 Hepatitis C RNA negative  Procedures:   4/19 Echocardiogram:LVEF=: 65% to 70%. -Tricuspid valve: Large 1 cm mobile vegetationon the anterior leaflet of the tricuspid valve with severe tricuspid regurgitation, consistent with vegetation. Severeregurgitation. - - Pulmonary arteries: PA peak pressure: 41 mm Hg (S). - Inferior vena cava: The vessel was dilated.   12/20 CT Angiogram chest:--Bilateral lower lobe pulmonary emboli with tapered appearance, this can be seen with chronic pulmonary emboli or possibly vasculopathy secondary to septic emboli. --Multiple cavitating and non cavitating pneumonia/consolidation of varying ages consistent with septic emboli. --Mild cardiomegaly and dilated RIGHT atrium. --Suspected splenomegaly.   1/2 transfuse 1 unit PRBC  Antimicrobials:   Vanco 04/03/2016 --> 1/7  Cefepime 04/03/2016 -->1/7   Signed:  Leisa Lenz, MD  [facility] 04/09/2016, 2:44 PM  Pager #: [phone]  Time spent in minutes: more than 30 minutes   Discharge Exam: Vitals:   04/08/16 2020 04/09/16 0415  BP: 111/76 102/64  Pulse: (!) 108 (!) 106  Resp: 18 18  Temp: 98.2 F (36.8 C) 98.3 F (36.8 C)   Vitals:   04/08/16 0526 04/08/16 1202 04/08/16 2020 04/09/16 0415  BP: 107/73 103/63 111/76 102/64  Pulse: (!) 102 (!) 107 (!) 108 (!) 106  Resp: _0 Temp: 99.2 F (37.3 C) 99 F (37.2 C) 98.2 F (36.8 C) 98.3 F (36.8 C)  TempSrc: Oral Oral Oral Oral  SpO2: 98% 98% 99% 98%  Weight:      Height:        General: Pt is alert, follows commands appropriately, not in acute distress Cardiovascular: Regular rate and rhythm, S1/S2 + Respiratory: Clear to auscultation bilaterally, no wheezing, no crackles, no rhonchi Abdominal: Soft, non tender, non distended, bowel sounds +, no guarding Extremities: no edema, no cyanosis, pulses palpable bilaterally DP and PT Neuro: Grossly nonfocal  Discharge  Instructions  Discharge Instructions    Call MD for:  persistant nausea and vomiting    Complete by:  As directed    Call MD for:  redness, tenderness, or signs of infection (pain, swelling, redness, odor or green/yellow discharge around incision site)    Complete by:  As directed    Call MD for:  severe uncontrolled pain    Complete by:  As directed    Diet - low sodium heart healthy    Complete by:  As directed    Discharge instructions    Complete by:  As directed    Continue Bactrim for 1 month on discharge and follow up with ID clinic in 2-3 weeks   Increase activity slowly    Complete by:  As directed      Allergies as of 04/09/2016   No Known Allergies     Medication List    STOP taking these medications   ciprofloxacin 500 MG tablet Commonly known as:  CIPRO   traMADol 50 MG tablet Commonly known as:  ULTRAM     TAKE these medications   acetaminophen 325 MG tablet Commonly known as:  TYLENOL Take 2 tablets (650 mg total) by mouth every 6 (six) hours as needed for moderate pain.   apixaban 5 MG Tabs tablet Commonly known as:  ELIQUIS Take 1 tablet (5 mg total) by mouth every 12 (twelve) hours. Start on 03/31/16   feeding supplement Liqd Take 1 Container by mouth 2 (two) times daily between meals.   ferrous gluconate 324 MG tablet Commonly known  asWynonia Lawman Take 1 tablet (324 mg total) by mouth 3 (three) times daily with meals.   folic acid 1 MG tablet Commonly known as:  FOLVITE Take 1 tablet (1 mg total) by mouth daily. Start taking on:  04/10/2016   LORazepam 1 MG tablet Commonly known as:  ATIVAN Take 1 tablet (1 mg total) by mouth every 8 (eight) hours as needed for anxiety.   multivitamin with minerals Tabs tablet Take 1 tablet by mouth daily.   sulfamethoxazole-trimethoprim 800-160 MG tablet Commonly known as:  BACTRIM DS,SEPTRA DS Take 1 tablet by mouth every 12 (twelve) hours.   thiamine 100 MG tablet Take 1 tablet (100 mg total) by mouth  daily. Start taking on:  04/10/2016      Follow-up Information    Michel Bickers, MD. Schedule an appointment as soon as possible for a visit.   Specialty:  Infectious Diseases Contact information: [address] [place] [address] [phone]            The results of significant diagnostics from this hospitalization (including imaging, microbiology, ancillary and laboratory) are listed below for reference.    Significant Diagnostic Studies: Dg Chest 2 View  Result Date: 04/03/2016 CLINICAL DATA:  Chest pain, cough for 2 days. History of pulmonary emboli, polysubstance abuse, pneumonia. EXAM: CHEST  2 VIEW COMPARISON:  CT chest March 22, 2016 FINDINGS: Cardiac silhouette is mildly enlarged unchanged. Dense consolidation LEFT mid and lower lung zone with small LEFT pleural effusion. Patchy consolidation RIGHT lung. No pneumothorax. Soft tissue planes and included osseous structures are nonsuspicious. IMPRESSION: Probable worsening of multifocal pneumonia in a background of known septic emboli. Stable mild cardiomegaly. Electronically Signed   By: Elon Alas M.D.   On: 04/03/2016 01:10   Dg Chest 2 View  Result Date: 03/15/2016 CLINICAL DATA:  Pneumonia, shortness of breath. EXAM: CHEST  2 VIEW COMPARISON:  Chest x-ray 03/14/2016. FINDINGS: Bilateral multifocal cavitary areas are again noted and unchanged since prior study. Small left pleural effusion. Heart is normal size. No acute bony abnormality. IMPRESSION: Stable multifocal cavitary pulmonary airspace opacities, most likely multifocal cavitary pneumonia. Septic emboli and malignancy are possible and not excluded. Electronically Signed   By: Rolm Baptise M.D.   On: 03/15/2016 10:31   Dg Chest 2 View  Result Date: 03/14/2016 CLINICAL DATA:  Chest pain. EXAM: CHEST  2 VIEW COMPARISON:  12/16/2015. FINDINGS: Mediastinum hilar structures stable. Multifocal bilateral cavitary pulmonary lesions are noted.  Multifocal cavitary pneumonia could present this fashion. Of malignancy would be less likely. Small left pleural effusion. No pneumothorax . Heart size normal. IMPRESSION: Multifocal cavitary pulmonary infiltrates most consistent with multifocal cavitary pneumonia. Chest CT may prove useful for further evaluation. These results will be called to the ordering clinician or representative by the Radiologist Assistant, and communication documented in the PACS or zVision Dashboard. Electronically Signed   By: Marcello Moores  Register   On: 03/14/2016 10:07   Ct Angio Chest Pe W Or Wo Contrast  Result Date: 03/22/2016 CLINICAL DATA:  Shortness of breath, endocarditis. Bacteremia. History of IV drug abuse, sepsis, severe tricuspid regurgitation vegetation. EXAM: CT ANGIOGRAPHY CHEST WITH CONTRAST TECHNIQUE: Multidetector CT imaging of the chest was performed using the standard protocol during bolus administration of intravenous contrast. Multiplanar CT image reconstructions and MIPs were obtained to evaluate the vascular anatomy. CONTRAST:  56 cc Isovue 370 COMPARISON:  Chest radiograph March 19, 2016 and CT chest November 17, 2015. FINDINGS: CARDIOVASCULAR: Adequate contrast opacification  of the pulmonary artery's. Main pulmonary artery is not enlarged. Bilateral lower lobe segmental to subsegmental occlusive pulmonary emboli with tapered appearance,. Mild cardiomegaly, dilated RIGHT atrium. No pericardial effusions. MEDIASTINUM/NODES: RIGHT hilar and subcarinal lymphadenopathy. LUNGS/PLEURA: Tracheobronchial tree is patent, no pneumothorax. Scattered small consolidations throughout the lungs bilaterally in addition to larger cavitating consolidations, some of which are new. Patchy ground-glass opacities. Trace pleural effusions. UPPER ABDOMEN: Included view of the abdomen is nonacute. Probable splenomegaly. MUSCULOSKELETAL: Patient is cachectic. 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 No acute osseous process. Review of the MIP images  confirms the above findings. IMPRESSION: Bilateral lower lobe pulmonary emboli with tapered appearance, this can be seen with chronic pulmonary emboli or possibly vasculopathy secondary to septic emboli. Multiple cavitating and non cavitating pneumonia/consolidation of varying ages consistent with septic emboli. Trace pleural effusions. Mild cardiomegaly and dilated RIGHT atrium. Suspected splenomegaly. Electronically Signed   By: Elon Alas M.D.   On: 03/22/2016 17:10   Dg Chest Port 1 View  Result Date: 03/20/2016 CLINICAL DATA:  Chest pain, shortness of breath, cough for 2 weeks. History of pneumonia and blood infection. EXAM: PORTABLE CHEST 1 VIEW COMPARISON:  Chest radiograph March 15, 2016. FINDINGS: Cardiomediastinal silhouette is normal. Central lucencies within LEFT midlung zone airspace opacity. Patchy additional airspace opacities with cavitation. Trace LEFT pleural effusion. No pneumothorax. Soft tissue planes and included osseous structures are nonsuspicious. IMPRESSION: Increasing cavitation of multifocal pneumonia. Trace LEFT pleural effusion. Electronically Signed   By: Elon Alas M.D.   On: 03/20/2016 00:25    Microbiology: Recent Results (from the past 240 hour(s))  Blood Culture (routine x 2)     Status: None   Collection Time: 04/03/16 12:27 AM  Result Value Ref Range Status   Specimen Description BLOOD RIGHT ANTECUBITAL  Final   Special Requests BOTTLES DRAWN AEROBIC AND ANAEROBIC 5CC  Final   Culture NO GROWTH 5 DAYS  Final   Report Status 04/08/2016 FINAL  Final  Urine culture     Status: Abnormal   Collection Time: 04/03/16  1:34 AM  Result Value Ref Range Status   Specimen Description URINE, CLEAN CATCH  Final   Special Requests NONE  Final   Culture <10,000 COLONIES/mL INSIGNIFICANT GROWTH (A)  Final   Report Status 04/04/2016 FINAL  Final  Culture, sputum-assessment     Status: None   Collection Time: 04/03/16  2:21 AM  Result Value Ref Range  Status   Specimen Description EXPECTORATED SPUTUM  Final   Special Requests NONE  Final   Sputum evaluation THIS SPECIMEN IS ACCEPTABLE FOR SPUTUM CULTURE  Final   Report Status 04/04/2016 FINAL  Final  Culture, respiratory (NON-Expectorated)     Status: None   Collection Time: 04/03/16  2:21 AM  Result Value Ref Range Status   Specimen Description EXPECTORATED SPUTUM  Final   Special Requests NONE Reflexed from [id number]  Final   Gram Stain   Final    FEW WBC PRESENT, PREDOMINANTLY PMN FEW BUDDING YEAST SEEN FEW GRAM POSITIVE RODS    Culture Consistent with normal respiratory flora.  Final   Report Status 04/06/2016 FINAL  Final  Respiratory Panel by PCR     Status: None   Collection Time: 04/03/16  4:06 AM  Result Value Ref Range Status   Adenovirus NOT DETECTED NOT DETECTED Final   Coronavirus 229E NOT DETECTED NOT DETECTED Final   Coronavirus HKU1 NOT DETECTED NOT DETECTED Final   Coronavirus NL63 NOT DETECTED NOT DETECTED Final   Coronavirus OC43 NOT  DETECTED NOT DETECTED Final   Metapneumovirus NOT DETECTED NOT DETECTED Final   Rhinovirus / Enterovirus NOT DETECTED NOT DETECTED Final   Influenza A NOT DETECTED NOT DETECTED Final   Influenza B NOT DETECTED NOT DETECTED Final   Parainfluenza Virus 1 NOT DETECTED NOT DETECTED Final   Parainfluenza Virus 2 NOT DETECTED NOT DETECTED Final   Parainfluenza Virus 3 NOT DETECTED NOT DETECTED Final   Parainfluenza Virus 4 NOT DETECTED NOT DETECTED Final   Respiratory Syncytial Virus NOT DETECTED NOT DETECTED Final   Bordetella pertussis NOT DETECTED NOT DETECTED Final   Chlamydophila pneumoniae NOT DETECTED NOT DETECTED Final   Mycoplasma pneumoniae NOT DETECTED NOT DETECTED Final  MRSA PCR Screening     Status: None   Collection Time: 04/03/16  4:07 AM  Result Value Ref Range Status   MRSA by PCR NEGATIVE NEGATIVE Final    Comment:        The GeneXpert MRSA Assay (FDA approved for NASAL specimens only), is one component of  a comprehensive MRSA colonization surveillance program. It is not intended to diagnose MRSA infection nor to guide or monitor treatment for MRSA infections.   Blood Culture (routine x 2)     Status: None   Collection Time: 04/03/16  5:18 AM  Result Value Ref Range Status   Specimen Description BLOOD LEFT ANTECUBITAL  Final   Special Requests BOTTLES DRAWN AEROBIC AND ANAEROBIC 4CC  Final   Culture NO GROWTH 5 DAYS  Final   Report Status 04/08/2016 FINAL  Final     Labs: Basic Metabolic Panel:  Recent Labs Lab 04/04/16 0530 04/05/16 0745 04/07/16 0336 04/08/16 0648 04/09/16 0336  NA 134* 139 138 135 141  K 3.9 4.3 3.5 3.3* 3.6  CL 105 105 104 102 105  CO2 _0 GLUCOSE 114* 143* 85 167* 95  BUN <5* _1 CREATININE 0.71 0.59 0.62 0.57 0.62  CALCIUM 7.8* 8.5* 7.9* 7.8* 8.2*  MG  --  2.1 1.7 1.7 2.0   Liver Function Tests:  Recent Labs Lab 04/03/16 0021 04/04/16 0530  AST 22 16  ALT 20 13*  ALKPHOS 111 71  BILITOT 0.5 0.4  PROT 8.7* 6.6  ALBUMIN 2.3* 1.7*   No results for input(s): LIPASE, AMYLASE in the last 168 hours. No results for input(s): AMMONIA in the last 168 hours. CBC:  Recent Labs Lab 04/03/16 0021 04/04/16 0530 04/05/16 0745 04/07/16 0336 04/08/16 0648 04/09/16 0336  WBC 14.9* 9.7 6.1 8.6 11.5* 10.4  NEUTROABS 10.1*  --  4.6 5.5  --   --   HGB 9.3* 7.1* 9.3* 9.8* 8.4* 9.1*  HCT 29.9* 23.4* 29.8* 31.9* 27.7* 29.9*  MCV 80.6 82.7 82.5 82.4 83.2 84.5  PLT 336 205 187 215 206 200   Cardiac Enzymes: No results for input(s): CKTOTAL, CKMB, CKMBINDEX, TROPONINI in the last 168 hours. BNP: BNP (last 3 results)  Recent Labs  11/16/15 2030 04/03/16 0021  BNP 215.3* 89.9    ProBNP (last 3 results) No results for input(s): PROBNP in the last 8760 hours.  CBG:  Recent Labs Lab 04/08/16 1708 04/09/16 1148  GLUCAP 285* 111*

## 2016-04-09 NOTE — Discharge Instructions (Signed)
Sulfamethoxazole; Trimethoprim, SMX-TMP tablets What is this medicine? SULFAMETHOXAZOLE; TRIMETHOPRIM or SMX-TMP (suhl fuh meth OK suh zohl; trye METH oh prim) is a combination of a sulfonamide antibiotic and a second antibiotic, trimethoprim. It is used to treat or prevent certain kinds of bacterial infections. It will not work for colds, flu, or other viral infections. This medicine may be used for other purposes; ask your health care provider or pharmacist if you have questions. COMMON BRAND NAME(S): Bacter-Aid DS, Bactrim, Bactrim DS, Septra, Septra DS What should I tell my health care provider before I take this medicine? They need to know if you have any of these conditions: -anemia -asthma -being treated with anticonvulsants -if you frequently drink alcohol containing drinks -kidney disease -liver disease -low level of folic acid or [id number]-6-phosphate dehydrogenase -poor nutrition or malabsorption -porphyria -severe allergies -thyroid disorder -an unusual or allergic reaction to sulfamethoxazole, trimethoprim, sulfa drugs, other medicines, foods, dyes, or preservatives -pregnant or trying to get pregnant -breast-feeding How should I use this medicine? Take this medicine by mouth with a full glass of water. Follow the directions on the prescription label. Take your medicine at regular intervals. Do not take it more often than directed. Do not skip doses or stop your medicine early. Talk to your pediatrician regarding the use of this medicine in children. Special care may be needed. This medicine has been used in children as young as 982 months of age. Overdosage: If you think you have taken too much of this medicine contact a poison control center or emergency room at once. NOTE: This medicine is only for you. Do not share this medicine with others. What if I miss a dose? If you miss a dose, take it as soon as you can. If it is almost time for your next dose, take only that dose. Do  not take double or extra doses. What may interact with this medicine? Do not take this medicine with any of the following medications: -aminobenzoate potassium -dofetilide -metronidazole This medicine may also interact with the following medications: -ACE inhibitors like benazepril, enalapril, lisinopril, and ramipril -birth control pills -cyclosporine -digoxin -diuretics -indomethacin -medicines for diabetes -methenamine -methotrexate -phenytoin -potassium supplements -pyrimethamine -sulfinpyrazone -tricyclic antidepressants -warfarin This list may not describe all possible interactions. Give your health care provider a list of all the medicines, herbs, non-prescription drugs, or dietary supplements you use. Also tell them if you smoke, drink alcohol, or use illegal drugs. Some items may interact with your medicine. What should I watch for while using this medicine? Tell your doctor or health care professional if your symptoms do not improve. Drink several glasses of water a day to reduce the risk of kidney problems. Do not treat diarrhea with over the counter products. Contact your doctor if you have diarrhea that lasts more than 2 days or if it is severe and watery. This medicine can make you more sensitive to the sun. Keep out of the sun. If you cannot avoid being in the sun, wear protective clothing and use a sunscreen. Do not use sun lamps or tanning beds/booths. What side effects may I notice from receiving this medicine? Side effects that you should report to your doctor or health care professional as soon as possible: -allergic reactions like skin rash or hives, swelling of the face, lips, or tongue -breathing problems -fever or chills, sore throat -irregular heartbeat, chest pain -joint or muscle pain -pain or difficulty passing urine -red pinpoint spots on skin -redness, blistering, peeling or loosening of  the skin, including inside the mouth -unusual bleeding or  bruising -unusually weak or tired -yellowing of the eyes or skin Side effects that usually do not require medical attention (report to your doctor or health care professional if they continue or are bothersome): -diarrhea -dizziness -headache -loss of appetite -nausea, vomiting -nervousness This list may not describe all possible side effects. Call your doctor for medical advice about side effects. You may report side effects to FDA at 1-800-FDA-1088. Where should I keep my medicine? Keep out of the reach of children. Store at room temperature between 20 to 25 degrees C (68 to 77 degrees F). Protect from light. Throw away any unused medicine after the expiration date. NOTE: This sheet is a summary. It may not cover all possible information. If you have questions about this medicine, talk to your doctor, pharmacist, or health care provider.  2017 Elsevier/Gold Standard (2012-10-25 14:38:26)

## 2016-04-10 ENCOUNTER — Emergency Department (HOSPITAL_COMMUNITY)
Admission: EM | Admit: 2016-04-10 | Discharge: 2016-04-10 | Disposition: A | Discharge status: Home / Self Care | Payer: Medicaid Other | Attending: Emergency Medicine | Admitting: Emergency Medicine

## 2016-04-10 DIAGNOSIS — Z8701 Personal history of pneumonia (recurrent): Secondary | ICD-10-CM

## 2016-04-10 DIAGNOSIS — R0602 Shortness of breath: Secondary | ICD-10-CM

## 2016-04-10 DIAGNOSIS — Z8679 Personal history of other diseases of the circulatory system: Secondary | ICD-10-CM

## 2016-04-10 LAB — CBC
HCT: 31.4 % — ABNORMAL LOW (ref 36.0–46.0)
Hemoglobin: 9.2 g/dL — ABNORMAL LOW (ref 12.0–15.0)
MCH: 25 pg — ABNORMAL LOW (ref 26.0–34.0)
MCHC: 29.3 g/dL — ABNORMAL LOW (ref 30.0–36.0)
MCV: 85.3 fL (ref 78.0–100.0)
PLATELETS: 190 10*3/uL (ref 150–400)
RBC: 3.68 MIL/uL — ABNORMAL LOW (ref 3.87–5.11)
RDW: 20.4 % — AB (ref 11.5–15.5)
WBC: 10.8 10*3/uL — AB (ref 4.0–10.5)

## 2016-04-10 LAB — I-STAT TROPONIN, ED: Troponin i, poc: 0 ng/mL (ref 0.00–0.08)

## 2016-04-10 LAB — BASIC METABOLIC PANEL
Anion gap: 9 (ref 5–15)
BUN: 10 mg/dL (ref 6–20)
CALCIUM: 8.4 mg/dL — AB (ref 8.9–10.3)
CO2: 25 mmol/L (ref 22–32)
CREATININE: 0.6 mg/dL (ref 0.44–1.00)
Chloride: 105 mmol/L (ref 101–111)
GFR calc Af Amer: >60 mL/min (ref >60)
Glucose, Bld: 81 mg/dL (ref 65–99)
Potassium: 4.3 mmol/L (ref 3.5–5.1)
SODIUM: 139 mmol/L (ref 135–145)

## 2016-04-10 MED ORDER — SODIUM CHLORIDE 0.9 % IV BOLUS (SEPSIS): 1000.0000 mL | Freq: Once | INTRAVENOUS | Status: DC

## 2016-04-10 MED ORDER — SULFAMETHOXAZOLE-TRIMETHOPRIM 800-160 MG PO TABS
1.0000 | ORAL_TABLET | Freq: Once | ORAL | Status: AC
  Administered 2016-04-10: 1 via ORAL
  Filled 2016-04-10: qty 1

## 2016-04-10 NOTE — ED Notes (Signed)
Attempted to draw labs from patient unsuccessfully.

## 2016-04-10 NOTE — ED Notes (Signed)
RN ambulated patient with pulse ox on room air in hallway. Pulse ox readings ranged from 92-100%. No respiratory distress noted.

## 2016-04-10 NOTE — Discharge Instructions (Signed)
You were seen today for shortness of breath. You  had a recent history of endocarditis and pneumonia. You need to continue Bactrim and her Cipro as prescribed at your prior discharges. Your workup here is reassuring. You were able to ambulate and maintain her oxygen saturations. Pain from drug use.

## 2016-04-10 NOTE — ED Provider Notes (Signed)
[facility]-EMERGENCY DEPT Provider Note   CSN: [id number] Arrival date & time: 04/09/16  2259  By signing my name below, I, Majel Homer, attest that this documentation has been prepared under the direction and in the presence of Shon Baton, MD . Electronically Signed: Majel Homer, Scribe. 04/10/2016. 3:48 AM.  History   Chief Complaint Chief Complaint  Patient presents with  . Chest Pain   The history is provided by the patient. No language interpreter was used.   HPI Comments: Catherine Hernandez is a 31 y.o. female with PMHx of CAP and polysubstance abuse, brought in by EMS to the Emergency Department complaining of gradually worsening, chest pain that began this morning. Pt reports she was seen at [facility] ED on 04/02/16 for symptoms of shortness of breath and was admitted for HCAP. She notes she was discharged on 04/09/16 and returned to the ED today due to new onset, intermittent, mid-sternal chest pain. She states her chest pain was present at discharge; however, her shortness of breath has worsened. She notes her shortness of breath is exacerbated with exacerbation and states she was "just lying down the whole time" in the hospital and now is more active. She notes she was prescribed bactrim at discharge but has not had this prescription filled. She reports associated generalized weakness and dry cough that began before she was admitted to the hospital. Pt was given 324 mg ASA by EMS en route to the ED with mild relief. She denies fever, nausea, vomiting, diarrhea, and sick contacts. Pt is currently taking Eliquis for bilateral PE.   Past Medical History:  Diagnosis Date  . Anemia   . CAP (community acquired pneumonia) 11/16/2015  . Childhood asthma   . Enterococcal bacteremia   . Heroin abuse   . Hyponatremia   . Pneumonia 2007  . Polysubstance abuse     Patient Active Problem List   Diagnosis Date Noted  . Serratia infection 04/04/2016  . IVDU (intravenous drug user) 04/04/2016  . Sepsis  due to pneumonia (HCC)   . Tobacco abuse 04/03/2016  . Palliative care by specialist 03/24/2016  . Septic embolism (HCC)   . HCAP (healthcare-associated pneumonia) 03/15/2016  . Symptomatic anemia 03/14/2016  . 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 Polysubstance abuse   . Enterococcal bacteremia 11/18/2015  . Staphylococcus aureus bacteremia 11/18/2015  . Normocytic anemia 11/18/2015  . Poor dentition 11/18/2015  . Protein-calorie malnutrition, severe (HCC) 11/18/2015  . Opioid use disorder, severe, dependence (HCC) 11/17/2015  . Sepsis (HCC) 11/16/2015    Past Surgical History:  Procedure Laterality Date  . NO PAST SURGERIES    . TEE WITHOUT CARDIOVERSION N/A 11/23/2015   Procedure: TRANSESOPHAGEAL ECHOCARDIOGRAM (TEE) WITH ANESTHESIA;  Surgeon: Quintella Reichert, MD;  Location: [facility] ENDOSCOPY;  Service: Cardiovascular;  Laterality: N/A;    OB History    Gravida Para Term Preterm AB Living   1 1 1  0 0 1   SAB TAB Ectopic Multiple Live Births   0 0 0 0 1     Home Medications    Prior to Admission medications   Medication Sig Start Date End Date Taking? Authorizing Provider  acetaminophen (TYLENOL) 325 MG tablet Take 2 tablets (650 mg total) by mouth every 6 (six) hours as needed for moderate pain. 04/09/16  Yes Alison Murray, MD  apixaban (ELIQUIS) 5 MG TABS tablet Take 1 tablet (5 mg total) by mouth every 12 (twelve) hours. Start on 03/31/16 03/31/16  Yes Ripudeep Jenna Luo,  MD  ferrous gluconate (FERGON) 324 MG tablet Take 1 tablet (324 mg total) by mouth 3 (three) times daily with meals. Patient not taking: Reported on 04/10/2016 03/28/16   Ripudeep Jenna Luo, MD  folic acid (FOLVITE) 1 MG tablet Take 1 tablet (1 mg total) by mouth daily. Patient not taking: Reported on 04/10/2016 04/10/16   Alison Murray, MD  LORazepam (ATIVAN) 1 MG tablet Take 1 tablet (1 mg total) by mouth every 8 (eight) hours as needed for anxiety. Patient not taking: Reported on 04/10/2016 03/28/16   Ripudeep Jenna Luo, MD  Multiple Vitamin (MULTIVITAMIN WITH MINERALS) TABS tablet Take 1 tablet by mouth daily. Patient not taking: Reported on 04/10/2016 03/29/16   Ripudeep Jenna Luo, MD  sulfamethoxazole-trimethoprim (BACTRIM DS,SEPTRA DS) 800-160 MG tablet Take 1 tablet by mouth every 12 (twelve) hours. Patient not taking: Reported on 04/10/2016 04/09/16   Alison Murray, MD  thiamine 100 MG tablet Take 1 tablet (100 mg total) by mouth daily. Patient not taking: Reported on 04/10/2016 04/10/16   Alison Murray, MD    Family History Family History  Problem Relation Age of Onset  . Anesthesia problems Neg Hx   . Hypotension Neg Hx   . Malignant hyperthermia Neg Hx   . Pseudochol deficiency Neg Hx     Social History Social History  Substance Use Topics  . Smoking status: Current Every Day Smoker    Packs/day: 0.50    Years: 10.00    Types: Cigarettes  . Smokeless tobacco: Never Used  . Alcohol use No     Allergies   Patient has no known allergies.   Review of Systems Review of Systems  Constitutional: Negative for fever.  Respiratory: Positive for cough and shortness of breath.   Cardiovascular: Positive for chest pain. Negative for leg swelling.  Gastrointestinal: Negative for diarrhea, nausea and vomiting.  Neurological: Positive for weakness.  All other systems reviewed and are negative.  Physical Exam Updated Vital Signs BP 102/67 (BP Location: Left Arm)   Pulse 118   Temp 99.1 F (37.3 C) (Oral)   Resp 16   Ht 5\' 5"  (1.651 m)   SpO2 100%   Physical Exam  Constitutional: She is oriented to person, place, and time. No distress.  Ill-appearing, cachectic, no acute distress  HENT:  Head: Normocephalic and atraumatic.  Cardiovascular: Normal rate, regular rhythm and normal heart sounds.   Pulmonary/Chest: Effort normal and breath sounds normal. No respiratory distress. She has no wheezes.  Abdominal: Soft. Bowel sounds are normal. There is no tenderness. There is no guarding.    Musculoskeletal: She exhibits no edema.  Neurological: She is alert and oriented to person, place, and time.  Skin: Skin is warm and dry.  Track marks noted bilateral forearms  Psychiatric: She has a normal mood and affect.  Nursing note and vitals reviewed.    ED Treatments / Results  Labs (all labs ordered are listed, but only abnormal results are displayed) Labs Reviewed  BASIC METABOLIC PANEL - Abnormal; Notable for the following:       Result Value   Calcium 8.4 (*)    All other components within normal limits  CBC - Abnormal; Notable for the following:    WBC 10.8 (*)    RBC 3.68 (*)    Hemoglobin 9.2 (*)    HCT 31.4 (*)    MCH 25.0 (*)    MCHC 29.3 (*)    RDW 20.4 (*)    All  other components within normal limits  I-STAT TROPOININ, ED    EKG  EKG Interpretation  Date/Time:  Sunday April 09 2016 23:07:50 EST Ventricular Rate:  118 PR Interval:  138 QRS Duration: 82 QT Interval:  334 QTC Calculation: 468 R Axis:   115 Text Interpretation:  Sinus tachycardia Right axis deviation Abnormal ECG No significant change since last tracing Confirmed by Wilkie AyeHORTON  MD, COURTNEY ([id number]) on 04/10/2016 3:37:21 AM       Radiology Dg Chest 2 View  Result Date: 04/09/2016 CLINICAL DATA:  Chest pain tonight.  History of heroin abuse. EXAM: CHEST  2 VIEW COMPARISON:  04/03/2016 FINDINGS: Shallow inspiration with elevation of left hemidiaphragm. Focal consolidation in the mid and lower left lung zone is improved since previous study but remains present. Follow-up to resolution is recommended. Small left pleural effusion. Linear fibrosis or atelectasis in the right mid lung and lower lung. Nodular changes seen previously in the right lung have mostly resolved. Peribronchial thickening and central streaky opacities likely indicate bronchitis. Heart size and pulmonary vascularity are normal. Spleen size appears mildly enlarged. IMPRESSION: Left lower lung consolidation with small left  pleural effusion, improving since previous study. Follow-up to resolution recommended. Focal areas of linear scarring or atelectasis in the right mid lung. Probable bronchitic changes. Electronically Signed   By: Burman NievesWilliam  Stevens M.D.   On: 04/09/2016 23:39   Procedures Procedures (including critical care time)  Medications Ordered in ED Medications  sodium chloride 0.9 % bolus 1,000 mL (0 mLs Intravenous Hold 04/10/16 0603)  sulfamethoxazole-trimethoprim (BACTRIM DS,SEPTRA DS) 800-160 MG per tablet 1 tablet (1 tablet Oral Given 04/10/16 0400)    DIAGNOSTIC STUDIES:  Oxygen Saturation is 100% on RA, normal by my interpretation.    COORDINATION OF CARE:  3:48 AM Discussed treatment plan with pt at bedside and pt agreed to plan.  Initial Impression / Assessment and Plan / ED Course  I have reviewed the triage vital signs and the nursing notes.  Pertinent labs & imaging results that were available during my care of the patient were reviewed by me and considered in my medical decision making (see chart for details).  Clinical Course     She presents with shortness of breath on exertion. Was just discharged less than 12 hours ago after being admitted for age And recent history of endocarditis. Reports that she was not mobile while in the hospital but has had dyspnea on exertion since. She is nontoxic. EKG is unchanged. Chest x-ray with some improvement. Lab work is stable. She ambulates maintains her pulse ox appropriately. Suspect this is natural course of her illness. She needs to continue Bactrim and Cipro as prescribed. She was given one dose of Bactrim here. She stated understanding.  After history, exam, and medical workup I feel the patient has been appropriately medically screened and is safe for discharge home. Pertinent diagnoses were discussed with the patient. Patient was given return precautions.  I personally performed the services described in this documentation, which was  scribed in my presence. The recorded information has been reviewed and is accurate.   Final Clinical Impressions(s) / ED Diagnoses   Final diagnoses:  SOB (shortness of breath)  History of endocarditis  History of pneumonia    New Prescriptions New Prescriptions   No medications on file     Shon Batonourtney F Horton, MD 04/10/16 [phone]

## 2016-04-25 ENCOUNTER — Ambulatory Visit: Payer: Self-pay | Admitting: Internal Medicine

## 2016-05-06 ENCOUNTER — Encounter (HOSPITAL_COMMUNITY): Payer: Self-pay

## 2016-05-06 ENCOUNTER — Emergency Department (HOSPITAL_COMMUNITY): Payer: Medicaid Other

## 2016-05-06 ENCOUNTER — Inpatient Hospital Stay (HOSPITAL_COMMUNITY): Payer: Medicaid Other

## 2016-05-06 ENCOUNTER — Inpatient Hospital Stay (HOSPITAL_COMMUNITY)
Admission: EM | Admit: 2016-05-06 | Discharge: 2016-06-01 | DRG: 853 | Disposition: E | Payer: Medicaid Other | Attending: Family Medicine | Admitting: Family Medicine

## 2016-05-06 DIAGNOSIS — E222 Syndrome of inappropriate secretion of antidiuretic hormone: Secondary | ICD-10-CM | POA: Diagnosis not present

## 2016-05-06 DIAGNOSIS — Z66 Do not resuscitate: Secondary | ICD-10-CM

## 2016-05-06 DIAGNOSIS — K029 Dental caries, unspecified: Secondary | ICD-10-CM | POA: Diagnosis not present

## 2016-05-06 DIAGNOSIS — Z720 Tobacco use: Secondary | ICD-10-CM | POA: Diagnosis present

## 2016-05-06 DIAGNOSIS — N170 Acute kidney failure with tubular necrosis: Secondary | ICD-10-CM

## 2016-05-06 DIAGNOSIS — F17210 Nicotine dependence, cigarettes, uncomplicated: Secondary | ICD-10-CM | POA: Diagnosis present

## 2016-05-06 DIAGNOSIS — D62 Acute posthemorrhagic anemia: Secondary | ICD-10-CM | POA: Diagnosis not present

## 2016-05-06 DIAGNOSIS — B9562 Methicillin resistant Staphylococcus aureus infection as the cause of diseases classified elsewhere: Secondary | ICD-10-CM | POA: Diagnosis present

## 2016-05-06 DIAGNOSIS — I2690 Septic pulmonary embolism without acute cor pulmonale: Secondary | ICD-10-CM | POA: Diagnosis present

## 2016-05-06 DIAGNOSIS — Y95 Nosocomial condition: Secondary | ICD-10-CM | POA: Diagnosis present

## 2016-05-06 DIAGNOSIS — Z590 Homelessness: Secondary | ICD-10-CM

## 2016-05-06 DIAGNOSIS — N189 Chronic kidney disease, unspecified: Secondary | ICD-10-CM | POA: Diagnosis not present

## 2016-05-06 DIAGNOSIS — R7881 Bacteremia: Secondary | ICD-10-CM | POA: Diagnosis present

## 2016-05-06 DIAGNOSIS — F1410 Cocaine abuse, uncomplicated: Secondary | ICD-10-CM | POA: Diagnosis present

## 2016-05-06 DIAGNOSIS — E162 Hypoglycemia, unspecified: Secondary | ICD-10-CM | POA: Diagnosis not present

## 2016-05-06 DIAGNOSIS — F1910 Other psychoactive substance abuse, uncomplicated: Secondary | ICD-10-CM | POA: Diagnosis not present

## 2016-05-06 DIAGNOSIS — Z515 Encounter for palliative care: Secondary | ICD-10-CM | POA: Diagnosis not present

## 2016-05-06 DIAGNOSIS — E871 Hypo-osmolality and hyponatremia: Secondary | ICD-10-CM | POA: Diagnosis present

## 2016-05-06 DIAGNOSIS — I330 Acute and subacute infective endocarditis: Secondary | ICD-10-CM | POA: Diagnosis present

## 2016-05-06 DIAGNOSIS — E861 Hypovolemia: Secondary | ICD-10-CM | POA: Diagnosis present

## 2016-05-06 DIAGNOSIS — A4901 Methicillin susceptible Staphylococcus aureus infection, unspecified site: Secondary | ICD-10-CM | POA: Diagnosis not present

## 2016-05-06 DIAGNOSIS — E872 Acidosis: Secondary | ICD-10-CM | POA: Diagnosis not present

## 2016-05-06 DIAGNOSIS — N179 Acute kidney failure, unspecified: Secondary | ICD-10-CM | POA: Diagnosis not present

## 2016-05-06 DIAGNOSIS — I071 Rheumatic tricuspid insufficiency: Secondary | ICD-10-CM | POA: Diagnosis present

## 2016-05-06 DIAGNOSIS — L89152 Pressure ulcer of sacral region, stage 2: Secondary | ICD-10-CM | POA: Diagnosis not present

## 2016-05-06 DIAGNOSIS — J15211 Pneumonia due to Methicillin susceptible Staphylococcus aureus: Secondary | ICD-10-CM | POA: Diagnosis present

## 2016-05-06 DIAGNOSIS — D696 Thrombocytopenia, unspecified: Secondary | ICD-10-CM | POA: Diagnosis present

## 2016-05-06 DIAGNOSIS — J45909 Unspecified asthma, uncomplicated: Secondary | ICD-10-CM | POA: Diagnosis present

## 2016-05-06 DIAGNOSIS — L8990 Pressure ulcer of unspecified site, unspecified stage: Secondary | ICD-10-CM | POA: Insufficient documentation

## 2016-05-06 DIAGNOSIS — Z86711 Personal history of pulmonary embolism: Secondary | ICD-10-CM | POA: Diagnosis not present

## 2016-05-06 DIAGNOSIS — E860 Dehydration: Secondary | ICD-10-CM | POA: Diagnosis present

## 2016-05-06 DIAGNOSIS — D6959 Other secondary thrombocytopenia: Secondary | ICD-10-CM | POA: Diagnosis present

## 2016-05-06 DIAGNOSIS — R451 Restlessness and agitation: Secondary | ICD-10-CM

## 2016-05-06 DIAGNOSIS — E875 Hyperkalemia: Secondary | ICD-10-CM | POA: Diagnosis not present

## 2016-05-06 DIAGNOSIS — J189 Pneumonia, unspecified organism: Secondary | ICD-10-CM | POA: Diagnosis not present

## 2016-05-06 DIAGNOSIS — I76 Septic arterial embolism: Secondary | ICD-10-CM | POA: Diagnosis present

## 2016-05-06 DIAGNOSIS — G9341 Metabolic encephalopathy: Secondary | ICD-10-CM | POA: Diagnosis not present

## 2016-05-06 DIAGNOSIS — R0789 Other chest pain: Secondary | ICD-10-CM | POA: Diagnosis not present

## 2016-05-06 DIAGNOSIS — J9621 Acute and chronic respiratory failure with hypoxia: Secondary | ICD-10-CM | POA: Diagnosis present

## 2016-05-06 DIAGNOSIS — I368 Other nonrheumatic tricuspid valve disorders: Secondary | ICD-10-CM

## 2016-05-06 DIAGNOSIS — R34 Anuria and oliguria: Secondary | ICD-10-CM | POA: Diagnosis not present

## 2016-05-06 DIAGNOSIS — J9601 Acute respiratory failure with hypoxia: Secondary | ICD-10-CM

## 2016-05-06 DIAGNOSIS — J984 Other disorders of lung: Secondary | ICD-10-CM

## 2016-05-06 DIAGNOSIS — Z9119 Patient's noncompliance with other medical treatment and regimen: Secondary | ICD-10-CM

## 2016-05-06 DIAGNOSIS — F1120 Opioid dependence, uncomplicated: Secondary | ICD-10-CM | POA: Diagnosis present

## 2016-05-06 DIAGNOSIS — R58 Hemorrhage, not elsewhere classified: Secondary | ICD-10-CM

## 2016-05-06 DIAGNOSIS — D6489 Other specified anemias: Secondary | ICD-10-CM | POA: Diagnosis not present

## 2016-05-06 DIAGNOSIS — Z8701 Personal history of pneumonia (recurrent): Secondary | ICD-10-CM

## 2016-05-06 DIAGNOSIS — Z8679 Personal history of other diseases of the circulatory system: Secondary | ICD-10-CM | POA: Diagnosis not present

## 2016-05-06 DIAGNOSIS — I079 Rheumatic tricuspid valve disease, unspecified: Secondary | ICD-10-CM | POA: Diagnosis present

## 2016-05-06 DIAGNOSIS — Z7901 Long term (current) use of anticoagulants: Secondary | ICD-10-CM | POA: Diagnosis not present

## 2016-05-06 DIAGNOSIS — A4102 Sepsis due to Methicillin resistant Staphylococcus aureus: Principal | ICD-10-CM | POA: Diagnosis present

## 2016-05-06 DIAGNOSIS — K089 Disorder of teeth and supporting structures, unspecified: Secondary | ICD-10-CM

## 2016-05-06 DIAGNOSIS — Z79899 Other long term (current) drug therapy: Secondary | ICD-10-CM | POA: Diagnosis not present

## 2016-05-06 DIAGNOSIS — B9689 Other specified bacterial agents as the cause of diseases classified elsewhere: Secondary | ICD-10-CM | POA: Diagnosis present

## 2016-05-06 DIAGNOSIS — E43 Unspecified severe protein-calorie malnutrition: Secondary | ICD-10-CM | POA: Diagnosis present

## 2016-05-06 DIAGNOSIS — R5381 Other malaise: Secondary | ICD-10-CM | POA: Diagnosis present

## 2016-05-06 DIAGNOSIS — F1310 Sedative, hypnotic or anxiolytic abuse, uncomplicated: Secondary | ICD-10-CM | POA: Diagnosis present

## 2016-05-06 DIAGNOSIS — J9600 Acute respiratory failure, unspecified whether with hypoxia or hypercapnia: Secondary | ICD-10-CM

## 2016-05-06 DIAGNOSIS — F064 Anxiety disorder due to known physiological condition: Secondary | ICD-10-CM | POA: Diagnosis not present

## 2016-05-06 DIAGNOSIS — A498 Other bacterial infections of unspecified site: Secondary | ICD-10-CM | POA: Diagnosis present

## 2016-05-06 DIAGNOSIS — R042 Hemoptysis: Secondary | ICD-10-CM | POA: Diagnosis not present

## 2016-05-06 DIAGNOSIS — T424X5A Adverse effect of benzodiazepines, initial encounter: Secondary | ICD-10-CM | POA: Diagnosis not present

## 2016-05-06 DIAGNOSIS — R0602 Shortness of breath: Secondary | ICD-10-CM | POA: Diagnosis not present

## 2016-05-06 DIAGNOSIS — F1990 Other psychoactive substance use, unspecified, uncomplicated: Secondary | ICD-10-CM | POA: Diagnosis present

## 2016-05-06 DIAGNOSIS — B9561 Methicillin susceptible Staphylococcus aureus infection as the cause of diseases classified elsewhere: Secondary | ICD-10-CM | POA: Diagnosis present

## 2016-05-06 DIAGNOSIS — Z9289 Personal history of other medical treatment: Secondary | ICD-10-CM

## 2016-05-06 DIAGNOSIS — Z452 Encounter for adjustment and management of vascular access device: Secondary | ICD-10-CM

## 2016-05-06 DIAGNOSIS — D649 Anemia, unspecified: Secondary | ICD-10-CM | POA: Diagnosis present

## 2016-05-06 DIAGNOSIS — R071 Chest pain on breathing: Secondary | ICD-10-CM | POA: Diagnosis not present

## 2016-05-06 DIAGNOSIS — Z681 Body mass index (BMI) 19 or less, adult: Secondary | ICD-10-CM | POA: Diagnosis not present

## 2016-05-06 DIAGNOSIS — B952 Enterococcus as the cause of diseases classified elsewhere: Secondary | ICD-10-CM | POA: Diagnosis present

## 2016-05-06 DIAGNOSIS — Z781 Physical restraint status: Secondary | ICD-10-CM

## 2016-05-06 DIAGNOSIS — E876 Hypokalemia: Secondary | ICD-10-CM | POA: Diagnosis not present

## 2016-05-06 DIAGNOSIS — D638 Anemia in other chronic diseases classified elsewhere: Secondary | ICD-10-CM | POA: Diagnosis present

## 2016-05-06 HISTORY — DX: Endocarditis, valve unspecified: I38

## 2016-05-06 LAB — CBC WITH DIFFERENTIAL/PLATELET
Basophils Absolute: 0.2 10*3/uL — ABNORMAL HIGH (ref 0.0–0.1)
Basophils Relative: 1 %
Eosinophils Absolute: 0 10*3/uL (ref 0.0–0.7)
Eosinophils Relative: 0 %
HCT: 34.9 % — ABNORMAL LOW (ref 36.0–46.0)
Hemoglobin: 12.1 g/dL (ref 12.0–15.0)
Lymphocytes Relative: 8 %
Lymphs Abs: 1.3 10*3/uL (ref 0.7–4.0)
MCH: 27.3 pg (ref 26.0–34.0)
MCHC: 34.7 g/dL (ref 30.0–36.0)
MCV: 78.8 fL (ref 78.0–100.0)
Monocytes Absolute: 0.8 10*3/uL (ref 0.1–1.0)
Monocytes Relative: 5 %
Neutro Abs: 13.9 10*3/uL — ABNORMAL HIGH (ref 1.7–7.7)
Neutrophils Relative %: 86 %
Platelets: 81 10*3/uL — ABNORMAL LOW (ref 150–400)
RBC: 4.43 MIL/uL (ref 3.87–5.11)
RDW: 19.5 % — ABNORMAL HIGH (ref 11.5–15.5)
WBC: 16.2 10*3/uL — ABNORMAL HIGH (ref 4.0–10.5)

## 2016-05-06 LAB — URINALYSIS, ROUTINE W REFLEX MICROSCOPIC
Bilirubin Urine: NEGATIVE
Glucose, UA: NEGATIVE mg/dL
Ketones, ur: NEGATIVE mg/dL
Nitrite: NEGATIVE
Protein, ur: 30 mg/dL — AB
Specific Gravity, Urine: 1.01 (ref 1.005–1.030)
pH: 5 (ref 5.0–8.0)

## 2016-05-06 LAB — RAPID URINE DRUG SCREEN, HOSP PERFORMED
Amphetamines: NOT DETECTED
Barbiturates: NOT DETECTED
Benzodiazepines: POSITIVE — AB
Cocaine: POSITIVE — AB
Opiates: POSITIVE — AB
Tetrahydrocannabinol: NOT DETECTED

## 2016-05-06 LAB — HEPATIC FUNCTION PANEL
ALT: 7 U/L — ABNORMAL LOW (ref 14–54)
AST: 12 U/L — ABNORMAL LOW (ref 15–41)
Albumin: 2.1 g/dL — ABNORMAL LOW (ref 3.5–5.0)
Alkaline Phosphatase: 103 U/L (ref 38–126)
Bilirubin, Direct: 0.7 mg/dL — ABNORMAL HIGH (ref 0.1–0.5)
Indirect Bilirubin: 0.8 mg/dL (ref 0.3–0.9)
Total Bilirubin: 1.5 mg/dL — ABNORMAL HIGH (ref 0.3–1.2)
Total Protein: 6.4 g/dL — ABNORMAL LOW (ref 6.5–8.1)

## 2016-05-06 LAB — BASIC METABOLIC PANEL
Anion gap: 16 — ABNORMAL HIGH (ref 5–15)
BUN: 64 mg/dL — ABNORMAL HIGH (ref 6–20)
CO2: 17 mmol/L — ABNORMAL LOW (ref 22–32)
Calcium: 7.8 mg/dL — ABNORMAL LOW (ref 8.9–10.3)
Chloride: 94 mmol/L — ABNORMAL LOW (ref 101–111)
Creatinine, Ser: 2.91 mg/dL — ABNORMAL HIGH (ref 0.44–1.00)
GFR calc Af Amer: 24 mL/min — ABNORMAL LOW (ref >60)
GFR calc non Af Amer: 21 mL/min — ABNORMAL LOW (ref >60)
Glucose, Bld: 119 mg/dL — ABNORMAL HIGH (ref 65–99)
Potassium: 3.4 mmol/L — ABNORMAL LOW (ref 3.5–5.1)
Sodium: 127 mmol/L — ABNORMAL LOW (ref 135–145)

## 2016-05-06 LAB — DIC (DISSEMINATED INTRAVASCULAR COAGULATION)PANEL
D-Dimer, Quant: 5.87 ug/mL-FEU — ABNORMAL HIGH (ref 0.00–0.50)
Fibrinogen: 511 mg/dL — ABNORMAL HIGH (ref 210–475)
INR: 1.24
Platelets: 73 10*3/uL — ABNORMAL LOW (ref 150–400)
Prothrombin Time: 15.7 seconds — ABNORMAL HIGH (ref 11.4–15.2)
Smear Review: NONE SEEN
aPTT: 30 seconds (ref 24–36)

## 2016-05-06 LAB — PROTIME-INR
INR: 1.12
Prothrombin Time: 14.5 seconds (ref 11.4–15.2)

## 2016-05-06 LAB — RAPID HIV SCREEN (HIV 1/2 AB+AG)
HIV 1/2 ANTIBODIES: NONREACTIVE
HIV-1 P24 ANTIGEN - HIV24: NONREACTIVE

## 2016-05-06 LAB — I-STAT CG4 LACTIC ACID, ED: Lactic Acid, Venous: 1.66 mmol/L (ref 0.5–1.9)

## 2016-05-06 LAB — CK: Total CK: 16 U/L — ABNORMAL LOW (ref 38–234)

## 2016-05-06 LAB — SEDIMENTATION RATE: Sed Rate: 80 mm/hr — ABNORMAL HIGH (ref 0–22)

## 2016-05-06 LAB — POC URINE PREG, ED: Preg Test, Ur: NEGATIVE

## 2016-05-06 LAB — PROCALCITONIN: Procalcitonin: 12.05 ng/mL

## 2016-05-06 LAB — HEPARIN LEVEL (UNFRACTIONATED): Heparin Unfractionated: <0.1 [IU]/mL — ABNORMAL LOW (ref 0.30–0.70)

## 2016-05-06 MED ORDER — ONDANSETRON HCL 4 MG PO TABS
4.0000 mg | ORAL_TABLET | Freq: Four times a day (QID) | ORAL | Status: DC | PRN
  Filled 2016-05-06: qty 1

## 2016-05-06 MED ORDER — DIPHENHYDRAMINE HCL 50 MG/ML IJ SOLN
INTRAMUSCULAR | Status: AC
  Administered 2016-05-06: 50 mg
  Filled 2016-05-06: qty 1

## 2016-05-06 MED ORDER — DEXTROSE 5 % IV SOLN
1.0000 g | INTRAVENOUS | Status: DC
  Filled 2016-05-06: qty 1

## 2016-05-06 MED ORDER — POTASSIUM CHLORIDE CRYS ER 20 MEQ PO TBCR
30.0000 meq | EXTENDED_RELEASE_TABLET | Freq: Once | ORAL | Status: AC
  Administered 2016-05-06: 30 meq via ORAL
  Filled 2016-05-06: qty 1

## 2016-05-06 MED ORDER — SODIUM CHLORIDE 0.9% FLUSH
3.0000 mL | Freq: Two times a day (BID) | INTRAVENOUS | Status: DC
  Administered 2016-05-08 – 2016-05-27 (×34): 3 mL via INTRAVENOUS

## 2016-05-06 MED ORDER — FENTANYL CITRATE (PF) 100 MCG/2ML IJ SOLN
50.0000 ug | Freq: Once | INTRAMUSCULAR | Status: AC
  Administered 2016-05-06: 50 ug via INTRAVENOUS
  Filled 2016-05-06: qty 2

## 2016-05-06 MED ORDER — HYDROCODONE-ACETAMINOPHEN 10-325 MG PO TABS
1.0000 | ORAL_TABLET | Freq: Four times a day (QID) | ORAL | Status: DC | PRN
  Administered 2016-05-06 – 2016-05-13 (×25): 2 via ORAL
  Filled 2016-05-06 (×26): qty 2

## 2016-05-06 MED ORDER — ACETAMINOPHEN 650 MG RE SUPP: 650.0000 mg | Freq: Four times a day (QID) | RECTAL | Status: DC | PRN

## 2016-05-06 MED ORDER — SODIUM CHLORIDE 0.9 % IV SOLN
500.0000 mg | INTRAVENOUS | Status: DC
  Administered 2016-05-07 – 2016-05-08 (×2): 500 mg via INTRAVENOUS
  Filled 2016-05-06 (×3): qty 500

## 2016-05-06 MED ORDER — SODIUM CHLORIDE 0.9 % IV BOLUS (SEPSIS)
750.0000 mL | Freq: Once | INTRAVENOUS | Status: AC
  Administered 2016-05-07: 750 mL via INTRAVENOUS

## 2016-05-06 MED ORDER — ACETAMINOPHEN 500 MG PO TABS
1000.0000 mg | ORAL_TABLET | Freq: Once | ORAL | Status: AC
  Administered 2016-05-06: 1000 mg via ORAL
  Filled 2016-05-06: qty 2

## 2016-05-06 MED ORDER — ACETAMINOPHEN 325 MG PO TABS
650.0000 mg | ORAL_TABLET | Freq: Four times a day (QID) | ORAL | Status: DC | PRN
Start: 2016-05-06 — End: 2016-05-22
  Administered 2016-05-10 – 2016-05-15 (×4): 650 mg via ORAL
  Filled 2016-05-06 (×6): qty 2

## 2016-05-06 MED ORDER — HEPARIN (PORCINE) IN NACL 100-0.45 UNIT/ML-% IJ SOLN
600.0000 [IU]/h | INTRAMUSCULAR | Status: DC
  Administered 2016-05-07: 600 [IU]/h via INTRAVENOUS
  Filled 2016-05-06 (×2): qty 250

## 2016-05-06 MED ORDER — DEXTROSE 5 % IV SOLN
2.0000 g | Freq: Once | INTRAVENOUS | Status: AC
  Administered 2016-05-06: 2 g via INTRAVENOUS
  Filled 2016-05-06: qty 2

## 2016-05-06 MED ORDER — SODIUM CHLORIDE 0.9 % IV SOLN
INTRAVENOUS | Status: AC
  Administered 2016-05-07: via INTRAVENOUS

## 2016-05-06 MED ORDER — ONDANSETRON HCL 4 MG/2ML IJ SOLN
4.0000 mg | Freq: Four times a day (QID) | INTRAMUSCULAR | Status: DC | PRN
  Administered 2016-05-12: 4 mg via INTRAVENOUS
  Filled 2016-05-06: qty 2

## 2016-05-06 MED ORDER — VANCOMYCIN HCL IN DEXTROSE 1-5 GM/200ML-% IV SOLN
1000.0000 mg | Freq: Once | INTRAVENOUS | Status: AC
  Administered 2016-05-06: 1000 mg via INTRAVENOUS
  Filled 2016-05-06: qty 200

## 2016-05-06 NOTE — ED Notes (Signed)
Patient transported to CT 

## 2016-05-06 NOTE — ED Provider Notes (Signed)
[facility]-EMERGENCY DEPT Provider Note   CSN: [id number] Arrival date & time: May 27, 2016  1402     History   Chief Complaint Chief Complaint  Patient presents with  . Shoulder Pain  . Generalized Body Aches    HPI Catherine Hernandez is a 31 y.o. female with history of polysubstance abuse, Endocarditis, CAP, homelessness who presents with right shoulder pain, right hip pain, neck, and generalized body aches. Patient was found by EMS outside a hotel where the patient has been sleeping on the concrete for 2 days. Patient states she fell on her right side and has been having right shoulder and right hip pain ever since. She is also having low back and buttocks pain. Patient denies hitting her head or losing consciousness. Patient denies any chest pain, shortness of breath, abdominal pain, nausea, vomiting, urinary symptoms. Patient is having a productive cough on exam.  HPI  Past Medical History:  Diagnosis Date  . Anemia   . CAP (community acquired pneumonia) 11/16/2015  . Childhood asthma   . Endocarditis   . Enterococcal bacteremia   . Heroin abuse   . Hyponatremia   . Pneumonia 2007  . Polysubstance abuse     Patient Active Problem List   Diagnosis Date Noted  . History of pulmonary embolism 2016-05-27  . Serratia infection 04/04/2016  . IVDU (intravenous drug user) 04/04/2016  . Sepsis due to pneumonia (HCC)   . Tobacco abuse 04/03/2016  . Palliative care by specialist 03/24/2016  . Septic embolism (HCC)   . HCAP (healthcare-associated pneumonia) 03/15/2016  . Symptomatic anemia 03/14/2016  . 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 Polysubstance abuse   . Enterococcal bacteremia 11/18/2015  . Staphylococcus aureus bacteremia 11/18/2015  . Normocytic anemia 11/18/2015  . Poor dentition 11/18/2015  . Protein-calorie malnutrition, severe (HCC) 11/18/2015  . Acute kidney injury (HCC)   . Opioid use disorder, severe, dependence (HCC) 11/17/2015  . Hyponatremia 11/17/2015    . Thrombocytopenia (HCC) 11/17/2015  . Cavitary lung disease   . Sepsis (HCC) 11/16/2015    Past Surgical History:  Procedure Laterality Date  . NO PAST SURGERIES    . TEE WITHOUT CARDIOVERSION N/A 11/23/2015   Procedure: TRANSESOPHAGEAL ECHOCARDIOGRAM (TEE) WITH ANESTHESIA;  Surgeon: Quintella Reichert, MD;  Location: [facility];  Service: Cardiovascular;  Laterality: N/A;    OB History    Gravida Para Term Preterm AB Living   1 1 1  0 0 1   SAB TAB Ectopic Multiple Live Births   0 0 0 0 1       Home Medications    Prior to Admission medications   Medication Sig Start Date End Date Taking? Authorizing Provider  apixaban (ELIQUIS) 5 MG TABS tablet Take 1 tablet (5 mg total) by mouth every 12 (twelve) hours. Start on 03/31/16 03/31/16  Yes Ripudeep Jenna Luo, MD  acetaminophen (TYLENOL) 325 MG tablet Take 2 tablets (650 mg total) by mouth every 6 (six) hours as needed for moderate pain. Patient not taking: Reported on 05/27/2016 04/09/16   Alison Murray, MD  ferrous gluconate (FERGON) 324 MG tablet Take 1 tablet (324 mg total) by mouth 3 (three) times daily with meals. Patient not taking: Reported on 04/10/2016 03/28/16   Ripudeep Jenna Luo, MD  folic acid (FOLVITE) 1 MG tablet Take 1 tablet (1 mg total) by mouth daily. Patient not taking: Reported on 04/10/2016 04/10/16   Alison Murray, MD  LORazepam (ATIVAN) 1 MG tablet Take 1 tablet (1 mg  total) by mouth every 8 (eight) hours as needed for anxiety. Patient not taking: Reported on 04/10/2016 03/28/16   Ripudeep Jenna Luo, MD  Multiple Vitamin (MULTIVITAMIN WITH MINERALS) TABS tablet Take 1 tablet by mouth daily. Patient not taking: Reported on 04/10/2016 03/29/16   Ripudeep Jenna Luo, MD  sulfamethoxazole-trimethoprim (BACTRIM DS,SEPTRA DS) 800-160 MG tablet Take 1 tablet by mouth every 12 (twelve) hours. Patient not taking: Reported on 04/10/2016 04/09/16   Alison Murray, MD  thiamine 100 MG tablet Take 1 tablet (100 mg total) by mouth daily. Patient not  taking: Reported on 04/10/2016 04/10/16   Alison Murray, MD    Family History Family History  Problem Relation Age of Onset  . Anesthesia problems Neg Hx   . Hypotension Neg Hx   . Malignant hyperthermia Neg Hx   . Pseudochol deficiency Neg Hx     Social History Social History  Substance Use Topics  . Smoking status: Current Every Day Smoker    Packs/day: 0.50    Years: 10.00    Types: Cigarettes  . Smokeless tobacco: Never Used  . Alcohol use No     Allergies   Patient has no known allergies.   Review of Systems Review of Systems  Constitutional: Negative for chills and fever.  HENT: Negative for facial swelling and sore throat.   Respiratory: Negative for shortness of breath.   Cardiovascular: Negative for chest pain.  Gastrointestinal: Negative for abdominal pain, nausea and vomiting.  Genitourinary: Negative for dysuria.  Musculoskeletal: Positive for arthralgias, back pain and neck pain.  Skin: Negative for rash and wound.  Neurological: Negative for headaches.  Psychiatric/Behavioral: The patient is not nervous/anxious.      Physical Exam Updated Vital Signs BP 113/71 (BP Location: Left Arm)   Pulse 108   Temp 97.4 F (36.3 C) (Oral)   Resp (!) 29   Ht 5\' 5"  (1.651 m)   Wt 43.1 kg   SpO2 100%   BMI 15.81 kg/m   Physical Exam  Constitutional: She appears well-developed and well-nourished. No distress.  Cachectic appearing  HENT:  Head: Normocephalic and atraumatic.  Mouth/Throat: Oropharynx is clear and moist. Abnormal dentition. No oropharyngeal exudate.  Eyes: Conjunctivae are normal. Pupils are equal, round, and reactive to light. Right eye exhibits no discharge. Left eye exhibits no discharge. No scleral icterus.  Neck: Normal range of motion. Neck supple. No thyromegaly present.  C-collar in place  Cardiovascular: Normal rate, regular rhythm, normal heart sounds and intact distal pulses.  Exam reveals no gallop and no friction rub.   No murmur  heard. Pulmonary/Chest: Effort normal and breath sounds normal. No stridor. No respiratory distress. She has no wheezes. She has no rales.  Abdominal: Soft. Bowel sounds are normal. She exhibits no distension. There is no tenderness. There is no rebound and no guarding.  Musculoskeletal: She exhibits no edema.       Right shoulder: She exhibits decreased range of motion, tenderness and bony tenderness. She exhibits normal pulse.       Right hip: She exhibits bony tenderness.       Thoracic back: She exhibits no tenderness and no bony tenderness.       Lumbar back: She exhibits bony tenderness.  R shoulder: Significant tenderness to joint, patient refuses to move RUE; normal sensation, radial pulses intact; no tenderness elsewhere R hip: bony tenderness to R hip; minor quarter size area of redness to superior, posterior lateral thigh  Lymphadenopathy:    She  has no cervical adenopathy.  Neurological: She is alert. Coordination normal.  Skin: Skin is warm and dry. No rash noted. She is not diaphoretic. No pallor.  Psychiatric: She has a normal mood and affect.  Nursing note and vitals reviewed.    ED Treatments / Results  Labs (all labs ordered are listed, but only abnormal results are displayed) Labs Reviewed  BASIC METABOLIC PANEL - Abnormal; Notable for the following:       Result Value   Sodium 127 (*)    Potassium 3.4 (*)    Chloride 94 (*)    CO2 17 (*)    Glucose, Bld 119 (*)    BUN 64 (*)    Creatinine, Ser 2.91 (*)    Calcium 7.8 (*)    GFR calc non Af Amer 21 (*)    GFR calc Af Amer 24 (*)    Anion gap 16 (*)    All other components within normal limits  CBC WITH DIFFERENTIAL/PLATELET - Abnormal; Notable for the following:    WBC 16.2 (*)    HCT 34.9 (*)    RDW 19.5 (*)    Platelets 81 (*)    Neutro Abs 13.9 (*)    Basophils Absolute 0.2 (*)    All other components within normal limits  CK - Abnormal; Notable for the following:    Total CK 16 (*)    All other  components within normal limits  URINALYSIS, ROUTINE W REFLEX MICROSCOPIC - Abnormal; Notable for the following:    APPearance HAZY (*)    Hgb urine dipstick LARGE (*)    Protein, ur 30 (*)    Leukocytes, UA SMALL (*)    Bacteria, UA RARE (*)    Squamous Epithelial / LPF 0-5 (*)    All other components within normal limits  RAPID URINE DRUG SCREEN, HOSP PERFORMED - Abnormal; Notable for the following:    Opiates POSITIVE (*)    Cocaine POSITIVE (*)    Benzodiazepines POSITIVE (*)    All other components within normal limits  SEDIMENTATION RATE - Abnormal; Notable for the following:    Sed Rate 80 (*)    All other components within normal limits  HEPATIC FUNCTION PANEL - Abnormal; Notable for the following:    Total Protein 6.4 (*)    Albumin 2.1 (*)    AST 12 (*)    ALT 7 (*)    Total Bilirubin 1.5 (*)    Bilirubin, Direct 0.7 (*)    All other components within normal limits  DIC (DISSEMINATED INTRAVASCULAR COAGULATION) PANEL - Abnormal; Notable for the following:    Prothrombin Time 15.7 (*)    Fibrinogen 511 (*)    D-Dimer, Quant 5.87 (*)    Platelets 73 (*)    All other components within normal limits  HEPARIN LEVEL (UNFRACTIONATED) - Abnormal; Notable for the following:    Heparin Unfractionated <0.10 (*)    All other components within normal limits  CULTURE, BLOOD (ROUTINE X 2)  CULTURE, BLOOD (ROUTINE X 2)  URINE CULTURE  CULTURE, EXPECTORATED SPUTUM-ASSESSMENT  ACID FAST SMEAR (AFB)  ACID FAST SMEAR (AFB)  ACID FAST CULTURE WITH REFLEXED SENSITIVITIES  PROTIME-INR  RAPID HIV SCREEN (HIV 1/2 AB+AG)  PROCALCITONIN  C-REACTIVE PROTEIN  MAGNESIUM  COMPREHENSIVE METABOLIC PANEL  CBC WITH DIFFERENTIAL/PLATELET  HEPARIN LEVEL (UNFRACTIONATED)  SODIUM, URINE, RANDOM  UREA NITROGEN, URINE  CREATININE, URINE, RANDOM  POC URINE PREG, ED  I-STAT CG4 LACTIC ACID, ED  I-STAT CG4 LACTIC  ACID, ED    EKG  EKG Interpretation  Date/Time:  Saturday May 06 2016  20:36:09 EST Ventricular Rate:  108 PR Interval:    QRS Duration: 99 QT Interval:  388 QTC Calculation: 521 R Axis:   117 Text Interpretation:  Sinus tachycardia Probable right ventricular hypertrophy Borderline ST elevation, anterior leads Prolonged QT interval No STEMI.  Confirmed by LONG MD, JOSHUA [phone]) on 05/13/2016 8:40:02 PM       Radiology Dg Chest 2 View  Result Date: 05/27/2016 CLINICAL DATA:  Per EMS- Patient reports that she has been sleeping on the ground x 2 days outside of a hotel. Patient also c/o falling 2 days ago and is having right shoulder, neck pain and generalized body aches; main pain in right shoulder; hx PNA; smoker; EXAM: CHEST  2 VIEW COMPARISON:  Chest x-ray dated 04/09/2016 FINDINGS: Heart size and mediastinal contours are normal. Patchy bilateral airspace opacities. No pleural effusion or pneumothorax seen. Osseous and soft tissue structures about the chest are unremarkable. IMPRESSION: Patchy bilateral airspace opacities, most likely multifocal pneumonia. Differential would also include septic emboli and asymmetric edema. Electronically Signed   By: Bary Richard M.D.   On: 05/11/2016 18:01   Dg Lumbar Spine Complete  Result Date: 05/05/2016 CLINICAL DATA:  Per EMS- Patient reports that she has been sleeping on the ground x 2 days outside of a hotel. Patient also c/o falling 2 days ago and is having right shoulder, neck pain and generalized body aches; main pain in right shoulder; hx PNA; smoker; EXAM: LUMBAR SPINE - COMPLETE 4+ VIEW COMPARISON:  None. FINDINGS: There is no evidence of lumbar spine fracture. Alignment is normal. Intervertebral disc spaces are maintained. Mildly prominent gas-filled loops of small bowel within the upper pelvis, of uncertain significance. IMPRESSION: 1. No bony abnormality. 2. Mildly prominent gas-filled loops of small bowel within the upper pelvis, of uncertain significance, incompletely imaged. Consider dedicated plain film of the  abdomen for further characterization. Electronically Signed   By: Bary Richard M.D.   On: 05/22/2016 18:03   Dg Shoulder Right  Result Date: 05/07/2016 CLINICAL DATA:  Per EMS- Patient reports that she has been sleeping on the ground x 2 days outside of a hotel. Patient also c/o falling 2 days ago and is having right shoulder, neck pain and generalized body aches; main pain in right shoulder; hx PNA; smoker; EXAM: RIGHT SHOULDER - 2+ VIEW COMPARISON:  None. FINDINGS: There is no evidence of fracture or dislocation. There is no evidence of arthropathy or other focal bone abnormality. Patchy infiltrates are identified within the visualized portion of the lung apices. IMPRESSION: No evidence for acute fracture or subluxation of the right shoulder. Bilateral pulmonary infiltrates. Electronically Signed   By: Norva Pavlov M.D.   On: 05/07/2016 18:00   Ct Chest Wo Contrast  Result Date: 05/22/2016 CLINICAL DATA:  Septic emboli versus TB.  Heroin abuse. EXAM: CT CHEST WITHOUT CONTRAST TECHNIQUE: Multidetector CT imaging of the chest was performed following the standard protocol without IV contrast. COMPARISON:  Chest CT dated 03/22/2016. FINDINGS: Cardiovascular: Heart size is normal. No pericardial effusion seen. Thoracic aorta appears grossly normal in caliber and configuration. Mediastinum/Nodes: Several moderate-sized lymph nodes are seen within the anterior mediastinum and right paratracheal region, largest measuring approximately 1.5 cm short axis dimension. Enlarged lymph nodes also noted within the left hilum, measuring up to 1.6 cm short axis dimension. Trachea and central bronchi are unremarkable. Lungs/Pleura: Scattered consolidations again noted throughout both lungs,  some of which are new, largest within the right upper lobe measuring 3.4 cm greatest dimension and within the lingula measuring approximately 3.2 cm. Many of the consolidations again demonstrate central cavitation. Upper Abdomen:  Probable splenomegaly Musculoskeletal: No acute or suspicious osseous finding. IMPRESSION: 1. Scattered consolidations throughout both lungs, some new compared to the earlier chest CT, majority located peripherally, many with central cavitation. As suggested on the earlier CT report, findings are compatible with septic emboli. Given the upper lobe cavitary consolidations and mediastinal lymphadenopathy, tuberculosis cannot be excluded. Differential would also include atypical pneumonias such as fungal and viral. 2. Mediastinal lymphadenopathy, as detailed above. 3. Probable splenomegaly. Electronically Signed   By: Bary Richard M.D.   On: 11-May-2016 20:42   Ct Cervical Spine Wo Contrast  Result Date: 05/11/2016 CLINICAL DATA:  Pain after fall EXAM: CT CERVICAL SPINE WITHOUT CONTRAST TECHNIQUE: Multidetector CT imaging of the cervical spine was performed without intravenous contrast. Multiplanar CT image reconstructions were also generated. COMPARISON:  None. FINDINGS: Alignment: Normal. Skull base and vertebrae: No acute fracture. No primary bone lesion or focal pathologic process. Soft tissues and spinal canal: No prevertebral fluid or swelling. No visible canal hematoma. Disc levels:  No degenerative disc disease. Upper chest: Opacities, some of which are nodular, in the lung apices. Several of these opacities and nodules demonstrate cavitation. Other: No other abnormalities. IMPRESSION: 1. Opacities and nodules in the apices with cavitation. Given the history of heroin abuse, this could represent septic emboli. However, atypical infections including tuberculosis should be considered. 2. No fracture or malalignment in the cervical spine. Findings called to the patient's PA, Buel Ream Electronically Signed   By: Gerome Sam III M.D   On: 2016-05-11 17:27   Dg Hip Unilat W Or Wo Pelvis 2-3 Views Right  Result Date: May 11, 2016 CLINICAL DATA:  Pain.  Fall 2 days ago. EXAM: DG HIP (WITH OR WITHOUT  PELVIS) 2-3V RIGHT COMPARISON:  None. FINDINGS: There is no evidence of hip fracture or dislocation. There is no evidence of arthropathy or other focal bone abnormality. IMPRESSION: Negative. Electronically Signed   By: Gerome Sam III M.D   On: 11-May-2016 17:59    Procedures Procedures (including critical care time)  Angiocath insertion Performed by: Dr. Derwood Kaplan Consent: Verbal consent obtained. Risks and benefits: risks, benefits and alternatives were discussed Time out: Immediately prior to procedure a "time out" was called to verify the correct patient, procedure, equipment, support staff and site/side marked as required.  Preparation: Patient was prepped and draped in the usual sterile fashion.  Vein Location: R neck  Ultrasound Guided  Gauge: 20G  Normal blood return and flush without difficulty Patient tolerance: Patient tolerated the procedure well with no immediate complications.     Medications Ordered in ED Medications  vancomycin (VANCOCIN) 500 mg in sodium chloride 0.9 % 100 mL IVPB (not administered)  ceFEPIme (MAXIPIME) 1 g in dextrose 5 % 50 mL IVPB (not administered)  sodium chloride flush (NS) 0.9 % injection 3 mL (not administered)  0.9 %  sodium chloride infusion (not administered)  acetaminophen (TYLENOL) tablet 650 mg (not administered)    Or  acetaminophen (TYLENOL) suppository 650 mg (not administered)  ondansetron (ZOFRAN) tablet 4 mg (not administered)    Or  ondansetron (ZOFRAN) injection 4 mg (not administered)  sodium chloride 0.9 % bolus 750 mL (not administered)  HYDROcodone-acetaminophen (NORCO) 10-325 MG per tablet 1-2 tablet (not administered)  heparin ADULT infusion 100 units/mL (25000 units/236mL sodium chloride 0.45%) (  not administered)  acetaminophen (TYLENOL) tablet 1,000 mg (1,000 mg Oral Given 05/23/2016 1533)  ceFEPIme (MAXIPIME) 2 g in dextrose 5 % 50 mL IVPB (0 g Intravenous Stopped 05/08/2016 1944)  vancomycin (VANCOCIN) IVPB  1000 mg/200 mL premix (0 mg Intravenous Stopped 05/23/2016 2030)  fentaNYL (SUBLIMAZE) injection 50 mcg (50 mcg Intravenous Given 05/08/2016 1842)  diphenhydrAMINE (BENADRYL) 50 MG/ML injection (50 mg  Given 05/04/2016 1944)  potassium chloride (K-DUR,KLOR-CON) CR tablet 30 mEq (30 mEq Oral Given 05/13/2016 2019)     Initial Impression / Assessment and Plan / ED Course  I have reviewed the triage vital signs and the nursing notes.  Pertinent labs & imaging results that were available during my care of the patient were reviewed by me and considered in my medical decision making (see chart for details).     Patient with possible septic emboli. Trauma x-rays negative for acute findings. CBC shows WBC 16.2. BMP shows sodium 127, potassium 3.4, chloride 94, CO2 17, glucose 119, BUNs 64, creatinine 2.91 (signifcantly increased from 1 month ago), calcium 7.8. CK 16. Lactate 1.66. Blood cultures pending. UA shows large hematuria, small leukocytes, rare bacteria, WBCs 6-30. Urine pregnancy negative. UDS shows opiates, cocaine, benzos. CT of the chest shows scattered consolidation throughout both lungs, some new compared to the earlier chest CT, majority located peripherally, many with central cavitation, compatible with septic emboli; given the upper lobe cavitary consolidations and mediastinal lymphadenopathy tuberculosis cannot be excluded; differential would also include atypical pneumonia such as fungal and viral. Vancomycin and cefepime initiated in the ED. Patient developed itching and minor hives following antibiotics. Benadryl given with resolution of symptoms. EJ placed by Dr. Rhunette CroftNanavati- see above procedure note. I spoke with Dr. Minerva Areolaypd with [facility] who will admit the patient for further evaluation and treatment. Patient also evaluated by Dr. Rhunette CroftNanavati who guided the patient's management and agrees with plan.  Final Clinical Impressions(s) / ED Diagnoses   Final diagnoses:  ARF (acute renal failure) [facility](HCC)      New Prescriptions New Prescriptions   No medications on file     Emi Holeslexandra M Laurent Cargile, Cordelia Poche-C 12-12-16 [id number]    Derwood KaplanAnkit Nanavati, MD 05/10/16 [phone]

## 2016-05-06 NOTE — Progress Notes (Signed)
ANTICOAGULATION CONSULT NOTE - Initial Consult  Pharmacy Consult for heparin Indication: hx pulmonary embolus (home Eliquis on hold)  No Known Allergies  Patient Measurements: Height: 5\' 5"  (165.1 cm) Weight: 95 lb (43.1 kg) IBW/kg (Calculated) : 57 Heparin Dosing Weight: 43 kg  Vital Signs: Temp: 97.4 F (36.3 C) (02/03 1411) Temp Source: Oral (02/03 1411) BP: 110/70 (02/03 1411) Pulse Rate: 110 (02/03 1715)  Labs:  Recent Labs  05/17/2016 1554 05/19/2016 1626  HGB 12.1  --   HCT 34.9*  --   PLT 81*  --   LABPROT  --  14.5  INR  --  1.12  CREATININE 2.91*  --   CKTOTAL 16*  --     Estimated Creatinine Clearance: 19.6 mL/min (by C-G formula based on SCr of 2.91 mg/dL (H)).   Medical History: Past Medical History:  Diagnosis Date  . Anemia   . CAP (community acquired pneumonia) 11/16/2015  . Childhood asthma   . Endocarditis   . Enterococcal bacteremia   . Heroin abuse   . Hyponatremia   . Pneumonia 2007  . Polysubstance abuse     Medications:  Home regimen: Eliquis 5 mg BID (reported last dose taken was 2 weeks ago)  Assessment: Patient is a 31 y.o homeless F with hx substance abuse and bilateral PEs (03/22/16) on Eliquis PTA, presented to the ED on 05/29/2016 with c/o right shoulder and neck pain s/p fall 2 days prior to admission.  Hip and shoulder xrays and certical CT with no fractures noted. Patient has AKI on admission-- scr 2.91 (crcl~20).  To transition Eliquis to heparin drip.  Of note, platelet on admission was 81K (was in the ~200K a month ago).  No bleeding noted. MD suspects thrombocytopenia may be secondary to sepsis.   Goal of Therapy:  Heparin level 0.3-0.7 units/ml Monitor platelets by anticoagulation protocol: Yes   Plan:  - heparin drip at 600 units/hr (will not bolus d/t low platelets) - check 8 hr heparin level - monitor cbc closely and for s/s bleeding - f/u renal funct  Tadarrius Burch P 05/29/2016,8:06 PM

## 2016-05-06 NOTE — ED Notes (Signed)
This RN and Lequita HaltMorgan, RN attempted IV access with ultrasound with no success. MD and PA at bedside attempting access with no success at this time.

## 2016-05-06 NOTE — ED Notes (Signed)
Delay in vitals Pt in CT or Xray

## 2016-05-06 NOTE — ED Notes (Signed)
Patient removed the c-collar placed by EMS. Patient refused to put it back on. Explained the importance of a c-collar to the patient.

## 2016-05-06 NOTE — Progress Notes (Signed)
Pharmacy Antibiotic Note  Catherine Hernandez is a 31 y.o. female admitted on 05/27/2016 with sepsis, possible pneumonia.  Pharmacy has been consulted for vancomycin and cefepime dosing. Note, patient with AKI.  Plan:  Vancomycin 1g IV x 1 in ED, then 500mg  IV q24h  Check trough at steady state, goal 15-20 mcg/ml  Cefepime 2g IV x 1, then 1g IV q24h  Follow up renal function & cultures, de-escalate as appropriate  Height: 5\' 5"  (165.1 cm) Weight: 95 lb (43.1 kg) IBW/kg (Calculated) : 57  Temp (24hrs), Avg:97.4 F (36.3 C), Min:97.4 F (36.3 C), Max:97.4 F (36.3 C)   Recent Labs Lab 06-15-16 1554  WBC 16.2*  CREATININE 2.91*    Estimated Creatinine Clearance: 19.6 mL/min (by C-G formula based on SCr of 2.91 mg/dL (H)).    No Known Allergies  Antimicrobials this admission:  2/3 Vanc >> 2/3 Cefepime >>  Dose adjustments this admission:  ---  Microbiology results:  2/3 BCx: sent 2/3 UCx: sent   Thank you for allowing pharmacy to be a part of this patient's care.  Loralee PacasErin Britani Beattie, PharmD, BCPS Pager: [phone] 05/17/2016 6:25 PM

## 2016-05-06 NOTE — ED Notes (Signed)
This RN heard patient screaming help.  Went to check on patient and her EJ IV access had been pulled out.  Will make admitting doctor aware.

## 2016-05-06 NOTE — ED Notes (Signed)
MD and PA were able to access IV through right EJ after 2 failed attempts.  Antibiotic administration delayed.

## 2016-05-06 NOTE — H&P (Signed)
History and Physical    Margretta Dittyngela N Silverio ZOX:[id number] DOB: Oct 07, 1985 DOA: 05/31/2016  PCP: No PCP Per Patient   Patient coming from: Home  Chief Complaint: Generalized aches and pain, productive cough, fatigue, malaise   HPI: Margretta Dittyngela N Barna is an unfortunate 31 y.o. female with medical history significant for IV drug abuse, endocarditis involving tricuspid valve, and nonadherence with her treatment plan, now presenting to the emergency department with generalized aches and pains, malaise, productive cough, and fatigue. Patient reports falling onto her right side approximately 2 days ago, denies striking her head or losing consciousness, but notes severe generalized pain, particularly in her neck and right shoulder. The patient is also had worsening in her productive cough and dyspnea, and reports a general malaise and fatigue. She has had a complicated recent medical course involving hospitalization in December 2017 with Serratia bacteremia, bilateral pulmonary emboli, and tricuspid valve endocarditis. She was evaluated by CT surgery at that time and deemed to not be a surgical candidate. She was treated with antibiotics in the hospital and discharged to complete a course of Cipro. She returned to the hospital with the starting of January 2018 with sepsis secondary to HCAP and noted to have cavitary pulmonary lesions. She was treated with empiric vancomycin and cefepime in the hospital and per ID consultant, she was not a PICC candidate and was discharged with 1 month of Bactrim which she never filled. Patient has been homeless and continues to use IV drugs. She does report adherence with her Eliquis. Since the recent hospital discharge, the patient reports that she had initially felt much improved, but has progressively worsened from a respiratory standpoint since that time, and also has had increasing general malaise and aches. Cough continues to be productive of thick yellow and brown sputum. Patient  denies any chest pain or palpitations and denies any headaches, change in vision or hearing, or focal numbness or weakness.  ED Course: Upon arrival to the ED, patient is found to be afebrile, saturating adequately on room air, tachycardic in the 110s, vitals otherwise stable. Chest x-ray features patchy bilateral airspace opacities, most likely representing multifocal pneumonia, but with septic emboli or asymmetric edema also possible. CT of the cervical spine is negative for acute fracture or malalignment, but notable for opacities and nodules in the lung apices with cavitation, possibly representing septic emboli or tuberculosis. Radiographs of the hip, right shoulder, and lumbar spine are negative for acute pathology. Patient was sent for CT chest and the read is pending. Chemistry panel features a sodium of 127, BUN of 64, and serum creatinine of 2.91, up from 0.6 in January 2018. CBC is notable for a worsened leukocytosis, WBC 16,200, as well as a new thrombocytopenia with platelets 81,000. Sedimentation rate is elevated to 80, lactic acid is reassuring at 1.66, and UDS is positive for benzodiazepines, opiates, and cocaine. Blood and urine cultures were obtained, patient was treated with acetaminophen, and later fentanyl, and empiric vancomycin and cefepime were administered. Patient remained tachycardic in the ED, but is not in acute respiratory distress. She'll be admitted to the telemetry unit for ongoing evaluation and management of possible sepsis with known endocarditis and cavitary lung disease and new renal failure and thrombocytopenia.  Review of Systems:  All other systems reviewed and apart from HPI, are negative.  Past Medical History:  Diagnosis Date  . Anemia   . CAP (community acquired pneumonia) 11/16/2015  . Childhood asthma   . Endocarditis   . Enterococcal bacteremia   .  Heroin abuse   . Hyponatremia   . Pneumonia 2007  . Polysubstance abuse     Past Surgical History:    Procedure Laterality Date  . NO PAST SURGERIES    . TEE WITHOUT CARDIOVERSION N/A 11/23/2015   Procedure: TRANSESOPHAGEAL ECHOCARDIOGRAM (TEE) WITH ANESTHESIA;  Surgeon: Quintella Reichert, MD;  Location: [facility];  Service: Cardiovascular;  Laterality: N/A;     reports that she has been smoking Cigarettes.  She has a 5.00 pack-year smoking history. She has never used smokeless tobacco. She reports that she uses drugs, including IV, Cocaine, Heroin, and Oxycodone. She reports that she does not drink alcohol.  No Known Allergies  Family History  Problem Relation Age of Onset  . Anesthesia problems Neg Hx   . Hypotension Neg Hx   . Malignant hyperthermia Neg Hx   . Pseudochol deficiency Neg Hx      Prior to Admission medications   Medication Sig Start Date End Date Taking? Authorizing Provider  apixaban (ELIQUIS) 5 MG TABS tablet Take 1 tablet (5 mg total) by mouth every 12 (twelve) hours. Start on 03/31/16 03/31/16  Yes Ripudeep Jenna Luo, MD  acetaminophen (TYLENOL) 325 MG tablet Take 2 tablets (650 mg total) by mouth every 6 (six) hours as needed for moderate pain. Patient not taking: Reported on 05/08/2016 04/09/16   Alison Murray, MD  ferrous gluconate (FERGON) 324 MG tablet Take 1 tablet (324 mg total) by mouth 3 (three) times daily with meals. Patient not taking: Reported on 04/10/2016 03/28/16   Ripudeep Jenna Luo, MD  folic acid (FOLVITE) 1 MG tablet Take 1 tablet (1 mg total) by mouth daily. Patient not taking: Reported on 04/10/2016 04/10/16   Alison Murray, MD  LORazepam (ATIVAN) 1 MG tablet Take 1 tablet (1 mg total) by mouth every 8 (eight) hours as needed for anxiety. Patient not taking: Reported on 04/10/2016 03/28/16   Ripudeep Jenna Luo, MD  Multiple Vitamin (MULTIVITAMIN WITH MINERALS) TABS tablet Take 1 tablet by mouth daily. Patient not taking: Reported on 04/10/2016 03/29/16   Ripudeep Jenna Luo, MD  sulfamethoxazole-trimethoprim (BACTRIM DS,SEPTRA DS) 800-160 MG tablet Take 1 tablet by mouth  every 12 (twelve) hours. Patient not taking: Reported on 04/10/2016 04/09/16   Alison Murray, MD  thiamine 100 MG tablet Take 1 tablet (100 mg total) by mouth daily. Patient not taking: Reported on 04/10/2016 04/10/16   Alison Murray, MD    Physical Exam: Vitals:   2016-05-08 1411 05/08/16 1412 2016/05/08 1715  BP: 110/70    Pulse: 115  110  Resp: 25    Temp: 97.4 F (36.3 C)    TempSrc: Oral    SpO2: 100%  96%  Weight:  43.1 kg (95 lb)   Height:  5\' 5"  (1.651 m)       Constitutional: No acute distress, cachectic, appears uncomfortable.  Eyes: PERTLA, lids and conjunctivae normal ENMT: Mucous membranes are dry. Posterior pharynx clear of any exudate or lesions.   Neck: normal, supple, no masses, no thyromegaly Respiratory: Coarse rhonchi throughout bilateral lung fields. No accessory muscle use.  Cardiovascular: Rate ~110 and regular, difficult to discern murmur over lung sounds. No extremity edema.  Abdomen: No distension, no tenderness, no masses palpated. Bowel sounds normal.  Musculoskeletal: no clubbing / cyanosis. No joint deformity upper and lower extremities.   Skin: Superficial abrasions and excoriations scattered about extremities and trunk. Warm, dry, well-perfused. Poor turgor. Neurologic: CN 2-12 grossly intact. Sensation intact, DTR normal.  Strength 5/5 in all 4 limbs.  Psychiatric: Alert and oriented x 3. Anxious     Labs on Admission: I have personally reviewed following labs and imaging studies  CBC:  Recent Labs Lab 05-23-16 1554  WBC 16.2*  NEUTROABS 13.9*  HGB 12.1  HCT 34.9*  MCV 78.8  PLT 81*   Basic Metabolic Panel:  Recent Labs Lab 2016/05/23 1554  NA 127*  K 3.4*  CL 94*  CO2 17*  GLUCOSE 119*  BUN 64*  CREATININE 2.91*  CALCIUM 7.8*   GFR: Estimated Creatinine Clearance: 19.6 mL/min (by C-G formula based on SCr of 2.91 mg/dL (H)). Liver Function Tests: No results for input(s): AST, ALT, ALKPHOS, BILITOT, PROT, ALBUMIN in the last 168  hours. No results for input(s): LIPASE, AMYLASE in the last 168 hours. No results for input(s): AMMONIA in the last 168 hours. Coagulation Profile:  Recent Labs Lab 05/23/16 1626  INR 1.12   Cardiac Enzymes:  Recent Labs Lab 05-23-2016 1554  CKTOTAL 16*   BNP (last 3 results) No results for input(s): PROBNP in the last 8760 hours. HbA1C: No results for input(s): HGBA1C in the last 72 hours. CBG: No results for input(s): GLUCAP in the last 168 hours. Lipid Profile: No results for input(s): CHOL, HDL, LDLCALC, TRIG, CHOLHDL, LDLDIRECT in the last 72 hours. Thyroid Function Tests: No results for input(s): TSH, T4TOTAL, FREET4, T3FREE, THYROIDAB in the last 72 hours. Anemia Panel: No results for input(s): VITAMINB12, FOLATE, FERRITIN, TIBC, IRON, RETICCTPCT in the last 72 hours. Urine analysis:    Component Value Date/Time   COLORURINE YELLOW 2016-05-23 1527   APPEARANCEUR HAZY (A) 05/23/16 1527   LABSPEC 1.010 2016-05-23 1527   PHURINE 5.0 05-23-16 1527   GLUCOSEU NEGATIVE 2016-05-23 1527   HGBUR LARGE (A) 05-23-16 1527   BILIRUBINUR NEGATIVE 2016/05/23 1527   KETONESUR NEGATIVE 2016/05/23 1527   PROTEINUR 30 (A) 05-23-2016 1527   UROBILINOGEN 0.2 01/07/2012 1140   NITRITE NEGATIVE May 23, 2016 1527   LEUKOCYTESUR SMALL (A) May 23, 2016 1527   Sepsis Labs: @LABRCNTIP (procalcitonin:4,lacticidven:4) )No results found for this or any previous visit (from the past 240 hour(s)).   Radiological Exams on Admission: Dg Chest 2 View  Result Date: 2016-05-23 CLINICAL DATA:  Per EMS- Patient reports that she has been sleeping on the ground x 2 days outside of a hotel. Patient also c/o falling 2 days ago and is having right shoulder, neck pain and generalized body aches; main pain in right shoulder; hx PNA; smoker; EXAM: CHEST  2 VIEW COMPARISON:  Chest x-ray dated 04/09/2016 FINDINGS: Heart size and mediastinal contours are normal. Patchy bilateral airspace opacities. No pleural  effusion or pneumothorax seen. Osseous and soft tissue structures about the chest are unremarkable. IMPRESSION: Patchy bilateral airspace opacities, most likely multifocal pneumonia. Differential would also include septic emboli and asymmetric edema. Electronically Signed   By: Bary Richard M.D.   On: 05-23-16 18:01   Dg Lumbar Spine Complete  Result Date: 2016/05/23 CLINICAL DATA:  Per EMS- Patient reports that she has been sleeping on the ground x 2 days outside of a hotel. Patient also c/o falling 2 days ago and is having right shoulder, neck pain and generalized body aches; main pain in right shoulder; hx PNA; smoker; EXAM: LUMBAR SPINE - COMPLETE 4+ VIEW COMPARISON:  None. FINDINGS: There is no evidence of lumbar spine fracture. Alignment is normal. Intervertebral disc spaces are maintained. Mildly prominent gas-filled loops of small bowel within the upper pelvis, of uncertain significance. IMPRESSION: 1.  No bony abnormality. 2. Mildly prominent gas-filled loops of small bowel within the upper pelvis, of uncertain significance, incompletely imaged. Consider dedicated plain film of the abdomen for further characterization. Electronically Signed   By: Bary Richard M.D.   On: May 25, 2016 18:03   Dg Shoulder Right  Result Date: 05-25-2016 CLINICAL DATA:  Per EMS- Patient reports that she has been sleeping on the ground x 2 days outside of a hotel. Patient also c/o falling 2 days ago and is having right shoulder, neck pain and generalized body aches; main pain in right shoulder; hx PNA; smoker; EXAM: RIGHT SHOULDER - 2+ VIEW COMPARISON:  None. FINDINGS: There is no evidence of fracture or dislocation. There is no evidence of arthropathy or other focal bone abnormality. Patchy infiltrates are identified within the visualized portion of the lung apices. IMPRESSION: No evidence for acute fracture or subluxation of the right shoulder. Bilateral pulmonary infiltrates. Electronically Signed   By: Norva Pavlov  M.D.   On: 2016-05-25 18:00   Ct Cervical Spine Wo Contrast  Result Date: May 25, 2016 CLINICAL DATA:  Pain after fall EXAM: CT CERVICAL SPINE WITHOUT CONTRAST TECHNIQUE: Multidetector CT imaging of the cervical spine was performed without intravenous contrast. Multiplanar CT image reconstructions were also generated. COMPARISON:  None. FINDINGS: Alignment: Normal. Skull base and vertebrae: No acute fracture. No primary bone lesion or focal pathologic process. Soft tissues and spinal canal: No prevertebral fluid or swelling. No visible canal hematoma. Disc levels:  No degenerative disc disease. Upper chest: Opacities, some of which are nodular, in the lung apices. Several of these opacities and nodules demonstrate cavitation. Other: No other abnormalities. IMPRESSION: 1. Opacities and nodules in the apices with cavitation. Given the history of heroin abuse, this could represent septic emboli. However, atypical infections including tuberculosis should be considered. 2. No fracture or malalignment in the cervical spine. Findings called to the patient's PA, Buel Ream Electronically Signed   By: Gerome Sam III M.D   On: 05-25-16 17:27   Dg Hip Unilat W Or Wo Pelvis 2-3 Views Right  Result Date: May 25, 2016 CLINICAL DATA:  Pain.  Fall 2 days ago. EXAM: DG HIP (WITH OR WITHOUT PELVIS) 2-3V RIGHT COMPARISON:  None. FINDINGS: There is no evidence of hip fracture or dislocation. There is no evidence of arthropathy or other focal bone abnormality. IMPRESSION: Negative. Electronically Signed   By: Gerome Sam III M.D   On: May 25, 2016 17:59    EKG: Ordered and pending.   Assessment/Plan  1. HCAP, cavitary lung disease  - CT findings include bilateral cavitary lesions, some new, likely septic emboli - Given the upper lobe cavitations and mediastinal adenopathy, TB cannot be excluded  - She was not a PICC candidate at recent discharge d/t ongoing IVDA, and was discharged to complete 1 month of Bactrim,  but it was never filled  - Blood cultures were obtained in ED and she was started on empiric vancomycin and cefepime  - Sputum cultures requested; AFB sputum cultures daily x3, airborne precautions  - Continue empiric abx with vancomycin and cefepime; follow cultures, consider adding antifungal    2. Endocarditis - TTE performed in December 2017 with 1 cm mobile mass on tricuspid valve consistent with vegetation  - She was evaluated by CTS in the hospital and deemed to not be a surgical candidate  - As above, she was discharged from hospital on 04/09/16 with 1 month of Bactrim, but never filled it  - There are no neurologic complaints and no  focal deficits elicited on exam  - Continue empiric abx as above while following cultures   3. Acute kidney injury - SCr is 2.91 on admission, up from 0.6 in January 2018  - Likely a prerenal azotemia in setting of acute infection and clinical dehydration  - Check renal US and urine studies  - Continue fluid-resuscitation, avoid nephrotoxins where possible, renally-dose medications, repeat chemistries in am    4. Thrombocytopenia  - Platelets 81,000 on admission, had been wnl last month  - Likely d/t the infectious process; DIC panel pending  - No bleeding identified on admission    5. Hyponatremia  - Serum sodium 127 on admission, had been normal last month  - This is in the setting of hypovolemia and she is being fluid-resuscitated with NS  - Urine studies pending  - Repeat chem panel in am    6. Hx of pulmonary embolism  - Pt was diagnosed with bilateral PE's in December 2017 and has been on Eliquis  - Given the acute renal failure and low platelets, case was discussed with pharmacy who advised IV heparin infusion - Follow platelets closely   7. Polysubstance abuse - Ongoing; UDS positive for cocaine, opiates, benzodiazepines  - Social work consultation requested     DVT prophylaxis: IV heparin infusion Code Status: Full  Family  Communication: Discussed with patient Disposition Plan: Admit to telemetry Consults called: None Admission status: Inpatient    Briscoe Deutscher, MD [facility] Pager [phone]  If 7PM-7AM, please contact night-coverage [email or web address] Password [email or web address]  05-07-16, 7:52 PM

## 2016-05-06 NOTE — ED Triage Notes (Signed)
Per EMS- Patient reports that she has been sleeping on the ground x 2 days outside of a hotel. Patient also c/o falling 2 days ago and is having right shoulder, neck pain  and generalized body aches. EMS placed a c-collar on the patient prior to arrival to the ED.

## 2016-05-06 NOTE — ED Notes (Signed)
Patient ate half of a sandwich with macaroni.

## 2016-05-07 ENCOUNTER — Inpatient Hospital Stay (HOSPITAL_COMMUNITY): Payer: Medicaid Other

## 2016-05-07 DIAGNOSIS — N189 Chronic kidney disease, unspecified: Secondary | ICD-10-CM

## 2016-05-07 DIAGNOSIS — F1410 Cocaine abuse, uncomplicated: Secondary | ICD-10-CM

## 2016-05-07 DIAGNOSIS — Z8679 Personal history of other diseases of the circulatory system: Secondary | ICD-10-CM

## 2016-05-07 LAB — BLOOD CULTURE ID PANEL (REFLEXED)

## 2016-05-07 LAB — COMPREHENSIVE METABOLIC PANEL
ALK PHOS: 91 U/L (ref 38–126)
ALT: 8 U/L — ABNORMAL LOW (ref 14–54)
ANION GAP: 10 (ref 5–15)
AST: 14 U/L — ABNORMAL LOW (ref 15–41)
Albumin: 2 g/dL — ABNORMAL LOW (ref 3.5–5.0)
BILIRUBIN TOTAL: 0.9 mg/dL (ref 0.3–1.2)
BUN: 62 mg/dL — ABNORMAL HIGH (ref 6–20)
CALCIUM: 7.3 mg/dL — AB (ref 8.9–10.3)
CO2: 18 mmol/L — ABNORMAL LOW (ref 22–32)
Chloride: 101 mmol/L (ref 101–111)
Creatinine, Ser: 2.78 mg/dL — ABNORMAL HIGH (ref 0.44–1.00)
GFR, EST AFRICAN AMERICAN: 26 mL/min — AB (ref >60)
GFR, EST NON AFRICAN AMERICAN: 22 mL/min — AB (ref >60)
Glucose, Bld: 168 mg/dL — ABNORMAL HIGH (ref 65–99)
POTASSIUM: 3.2 mmol/L — AB (ref 3.5–5.1)
Sodium: 129 mmol/L — ABNORMAL LOW (ref 135–145)
TOTAL PROTEIN: 6.2 g/dL — AB (ref 6.5–8.1)

## 2016-05-07 LAB — CBC WITH DIFFERENTIAL/PLATELET
BASOS ABS: 0.1 10*3/uL (ref 0.0–0.1)
Basophils Relative: 1 %
EOS ABS: 0.1 10*3/uL (ref 0.0–0.7)
Eosinophils Relative: 1 %
HCT: 29.4 % — ABNORMAL LOW (ref 36.0–46.0)
Hemoglobin: 10.1 g/dL — ABNORMAL LOW (ref 12.0–15.0)
LYMPHS ABS: 1.4 10*3/uL (ref 0.7–4.0)
LYMPHS PCT: 11 %
MCH: 27.4 pg (ref 26.0–34.0)
MCHC: 34.4 g/dL (ref 30.0–36.0)
MCV: 79.9 fL (ref 78.0–100.0)
MONOS PCT: 9 %
Monocytes Absolute: 1.1 10*3/uL — ABNORMAL HIGH (ref 0.1–1.0)
Neutro Abs: 10 10*3/uL — ABNORMAL HIGH (ref 1.7–7.7)
Neutrophils Relative %: 78 %
PLATELETS: 73 10*3/uL — AB (ref 150–400)
RBC: 3.68 MIL/uL — AB (ref 3.87–5.11)
RDW: 19.5 % — AB (ref 11.5–15.5)
WBC: 12.7 10*3/uL — AB (ref 4.0–10.5)

## 2016-05-07 LAB — HEPARIN LEVEL (UNFRACTIONATED): Heparin Unfractionated: <0.1 IU/mL — ABNORMAL LOW (ref 0.30–0.70)

## 2016-05-07 LAB — EXPECTORATED SPUTUM ASSESSMENT W REFEX TO RESP CULTURE

## 2016-05-07 LAB — EXPECTORATED SPUTUM ASSESSMENT W GRAM STAIN, RFLX TO RESP C

## 2016-05-07 LAB — C-REACTIVE PROTEIN: CRP: 19.4 mg/dL — ABNORMAL HIGH (ref <1.0)

## 2016-05-07 LAB — GLUCOSE, CAPILLARY: GLUCOSE-CAPILLARY: 216 mg/dL — AB (ref 65–99)

## 2016-05-07 LAB — MAGNESIUM: Magnesium: 2.2 mg/dL (ref 1.7–2.4)

## 2016-05-07 LAB — MRSA PCR SCREENING: MRSA by PCR: NEGATIVE

## 2016-05-07 LAB — CREATININE, URINE, RANDOM: CREATININE, URINE: 58.92 mg/dL

## 2016-05-07 LAB — SODIUM, URINE, RANDOM: Sodium, Ur: <10 mmol/L

## 2016-05-07 MED ORDER — HEPARIN (PORCINE) IN NACL 100-0.45 UNIT/ML-% IJ SOLN
1250.0000 [IU]/h | INTRAMUSCULAR | Status: DC
  Administered 2016-05-07: 1000 [IU]/h via INTRAVENOUS
  Administered 2016-05-08: 1250 [IU]/h via INTRAVENOUS
  Filled 2016-05-07: qty 250

## 2016-05-07 MED ORDER — SODIUM CHLORIDE 0.9 % IV SOLN
30.0000 meq | Freq: Once | INTRAVENOUS | Status: AC
  Administered 2016-05-07: 30 meq via INTRAVENOUS
  Filled 2016-05-07: qty 15

## 2016-05-07 MED ORDER — ENSURE ENLIVE PO LIQD
237.0000 mL | Freq: Two times a day (BID) | ORAL | Status: DC
  Administered 2016-05-09 – 2016-05-12 (×4): 237 mL via ORAL

## 2016-05-07 MED ORDER — HEPARIN BOLUS VIA INFUSION
1000.0000 [IU] | Freq: Once | INTRAVENOUS | Status: AC
  Administered 2016-05-07: 1000 [IU] via INTRAVENOUS
  Filled 2016-05-07: qty 1000

## 2016-05-07 NOTE — Consult Note (Signed)
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  Check acute hepatitis panel, HIV (-)  Thank you so much for this interesting consult,   Bobby Rumpf (pager) [phone] [email or web address]  Catherine Hernandez is an 31 y.o. female.  HPI: 31 yo F with hx of prev IVDA, TV IE (BCx serratia), hospitalization 04-2016 with cavitary pna (d/c with 1 month of bactrim which was not filled), comes to hospital on 2-3 with fatigue, cough, body aches and thick yellow brown sputum production.  Her UDS was positive for cocaine/opiates/benzos. She was also noted to have a significant increase in her Cr (2.91).  She was started on vanco/cefepime.   Past Medical History:  Diagnosis Date  . Anemia   . CAP (community acquired pneumonia) 11/16/2015  . Childhood asthma   . Endocarditis   . Enterococcal bacteremia   . Heroin abuse   . Hyponatremia   . Pneumonia 2007  . Polysubstance abuse     Past Surgical History:  Procedure Laterality Date  . NO PAST SURGERIES    . TEE WITHOUT CARDIOVERSION N/A 11/23/2015   Procedure: TRANSESOPHAGEAL ECHOCARDIOGRAM (TEE) WITH ANESTHESIA;  Surgeon: Sueanne Margarita, MD;  Location: [facility];  Service: Cardiovascular;  Laterality: N/A;     No Known Allergies  Medications:  Scheduled: . ceFEPime (MAXIPIME) IV  1 g Intravenous Q24H  .  feeding supplement (ENSURE ENLIVE)  237 mL Oral BID BM  . potassium chloride (KCL MULTIRUN) 30 mEq in 265 mL IVPB  30 mEq Intravenous Once  . sodium chloride flush  3 mL Intravenous Q12H  . vancomycin  500 mg Intravenous Q24H    Abtx:  Anti-infectives    Start     Dose/Rate Route Frequency Ordered Stop   05/07/16 2000  vancomycin (VANCOCIN) 500 mg in sodium chloride 0.9 % 100 mL IVPB     500 mg 100 mL/hr over 60 Minutes Intravenous Every 24 hours 05/07/2016 1824     05/07/16 2000  ceFEPIme (MAXIPIME) 1 g in dextrose 5 % 50 mL IVPB     1 g 100 mL/hr over 30 Minutes Intravenous Every 24 hours 05/21/2016 1824     05/08/2016 1800  ceFEPIme (MAXIPIME) 2 g in dextrose 5 % 50 mL IVPB     2 g 100 mL/hr over 30 Minutes Intravenous  Once 05/11/2016 1754 05/15/2016 1944   05/13/2016 1800  vancomycin (VANCOCIN) IVPB 1000 mg/200 mL premix     1,000 mg 200 mL/hr over 60 Minutes Intravenous  Once 05/24/2016 1754 05/09/2016 2030      Total days of antibiotics: 1 vanco/cefepime          Social History:  reports that she has been smoking Cigarettes.  She has a 5.00 pack-year smoking history. She has never used smokeless tobacco. She reports that she uses drugs, including IV, Cocaine, Heroin, and Oxycodone. She reports that she does not drink alcohol.  Family History  Problem Relation Age of Onset  .  Anesthesia problems Neg Hx   . Hypotension Neg Hx   . Malignant hyperthermia Neg Hx   . Pseudochol deficiency Neg Hx     General ROS: +f/c, no anorexia, normal BM, normal urine. + back pain. +pleuritic pain. continued cough. Please see HPI. 12 point ROS o/w (-)  Blood pressure 120/64, pulse (!) 114, temperature 98.4 F (36.9 C), temperature source Oral, resp. rate (!) 24, height _0  (1.651 m), weight 43.1 kg (95 lb), SpO2 96 %. General appearance: alert, cooperative, fatigued and mild distress Eyes: negative findings: conjunctivae and sclerae normal and pupils equal, round, reactive to light and  accomodation Throat: normal findings: oropharynx pink & moist without lesions or evidence of thrush Neck: no adenopathy and supple, symmetrical, trachea midline Lungs: clear to auscultation bilaterally Heart: regular rate and rhythm Abdomen: normal findings: bowel sounds normal and soft, non-tender Extremities: edema none. no nail bed lesions.  Skin: multiple scars.    Results for orders placed or performed during the hospital encounter of 05/11/2016 (from the past 48 hour(s))  Urinalysis, Routine w reflex microscopic     Status: Abnormal   Collection Time: 05/20/2016  3:27 PM  Result Value Ref Range   Color, Urine YELLOW YELLOW   APPearance HAZY (A) CLEAR   Specific Gravity, Urine 1.010 1.005 - 1.030   pH 5.0 5.0 - 8.0   Glucose, UA NEGATIVE NEGATIVE mg/dL   Hgb urine dipstick LARGE (A) NEGATIVE   Bilirubin Urine NEGATIVE NEGATIVE   Ketones, ur NEGATIVE NEGATIVE mg/dL   Protein, ur 30 (A) NEGATIVE mg/dL   Nitrite NEGATIVE NEGATIVE   Leukocytes, UA SMALL (A) NEGATIVE   RBC / HPF 0-5 0 - 5 RBC/hpf   WBC, UA 6-30 0 - 5 WBC/hpf   Bacteria, UA RARE (A) NONE SEEN   Squamous Epithelial / LPF 0-5 (A) NONE SEEN  Rapid urine drug screen (hospital performed)     Status: Abnormal   Collection Time: 05/05/2016  3:27 PM  Result Value Ref Range   Opiates POSITIVE (A) NONE DETECTED   Cocaine POSITIVE (A) NONE DETECTED   Benzodiazepines POSITIVE (A) NONE DETECTED   Amphetamines NONE DETECTED NONE DETECTED   Tetrahydrocannabinol NONE DETECTED NONE DETECTED   Barbiturates NONE DETECTED NONE DETECTED    Comment:        DRUG SCREEN FOR MEDICAL PURPOSES ONLY.  IF CONFIRMATION IS NEEDED FOR ANY PURPOSE, NOTIFY LAB WITHIN 5 DAYS.        LOWEST DETECTABLE LIMITS FOR URINE DRUG SCREEN Drug Class       Cutoff (ng/mL) Amphetamine      1000 Barbiturate      200 Benzodiazepine   419 Tricyclics       379 Opiates          300 Cocaine          300 THC              50   Sodium, urine, random      Status: None   Collection Time: 05/15/2016  3:27 PM  Result Value Ref Range   Sodium, Ur <10 mmol/L    Comment: Performed at [facility] [address], [place], [place] [address]  Creatinine, urine, random     Status: None   Collection Time: 05/31/2016  3:27 PM  Result Value Ref Range   Creatinine, Urine 58.92 mg/dL    Comment: Performed at [facility] [address], [place], [place] [address]  Basic metabolic  panel     Status: Abnormal   Collection Time: 05/14/2016  3:54 PM  Result Value Ref Range   Sodium 127 (L) 135 - 145 mmol/L   Potassium 3.4 (L) 3.5 - 5.1 mmol/L   Chloride 94 (L) 101 - 111 mmol/L   CO2 17 (L) 22 - 32 mmol/L   Glucose, Bld 119 (H) 65 - 99 mg/dL   BUN 64 (H) 6 - 20 mg/dL   Creatinine, Ser 2.91 (H) 0.44 - 1.00 mg/dL   Calcium 7.8 (L) 8.9 - 10.3 mg/dL   GFR calc non Af Amer 21 (L) >60 mL/min   GFR calc Af Amer 24 (L) >60 mL/min    Comment: (NOTE) The eGFR has been calculated using the CKD EPI equation. This calculation has not been validated in all clinical situations. eGFR's persistently <60 mL/min signify possible Chronic Kidney Disease.    Anion gap 16 (H) 5 - 15  CBC with Differential     Status: Abnormal   Collection Time: 05/30/2016  3:54 PM  Result Value Ref Range   WBC 16.2 (H) 4.0 - 10.5 K/uL   RBC 4.43 3.87 - 5.11 MIL/uL   Hemoglobin 12.1 12.0 - 15.0 g/dL   HCT 34.9 (L) 36.0 - 46.0 %   MCV 78.8 78.0 - 100.0 fL   MCH 27.3 26.0 - 34.0 pg   MCHC 34.7 30.0 - 36.0 g/dL   RDW 19.5 (H) 11.5 - 15.5 %   Platelets 81 (L) 150 - 400 K/uL    Comment: RESULT REPEATED AND VERIFIED SPECIMEN CHECKED FOR CLOTS PLATELET COUNT CONFIRMED BY SMEAR    Neutrophils Relative % 86 %   Lymphocytes Relative 8 %   Monocytes Relative 5 %   Eosinophils Relative 0 %   Basophils Relative 1 %   Neutro Abs 13.9 (H) 1.7 - 7.7 K/uL   Lymphs Abs 1.3 0.7 - 4.0 K/uL   Monocytes Absolute 0.8 0.1 - 1.0 K/uL   Eosinophils Absolute 0.0 0.0 - 0.7 K/uL   Basophils  Absolute 0.2 (H) 0.0 - 0.1 K/uL   Smear Review MORPHOLOGY UNREMARKABLE   CK     Status: Abnormal   Collection Time: 05/16/2016  3:54 PM  Result Value Ref Range   Total CK 16 (L) 38 - 234 U/L  Sedimentation rate     Status: Abnormal   Collection Time: 05/05/2016  4:26 PM  Result Value Ref Range   Sed Rate 80 (H) 0 - 22 mm/hr  C-reactive protein     Status: Abnormal   Collection Time: 05/29/2016  4:26 PM  Result Value Ref Range   CRP 19.4 (H) <1.0 mg/dL    Comment: Performed at [facility], [address] [place], [place] [address]  Protime-INR     Status: None   Collection Time: 05/15/2016  4:26 PM  Result Value Ref Range   Prothrombin Time 14.5 11.4 - 15.2 seconds   INR 1.12   POC urine preg, ED     Status: None   Collection Time: 05/08/2016  5:19 PM  Result Value Ref Range   Preg Test, Ur NEGATIVE NEGATIVE    Comment:        THE SENSITIVITY OF THIS METHODOLOGY IS >24 mIU/mL   Blood culture (routine x 2)     Status: None (Preliminary result)   Collection Time: 05/16/2016  5:35 PM  Result Value Ref Range   Specimen Description BLOOD LEFT ARM    Special Requests IN PEDIATRIC BOTTLE [facility]  Culture  Setup Time      GRAM POSITIVE COCCI IN CLUSTERS IN PEDIATRIC BOTTLE CRITICAL RESULT CALLED TO, READ BACK BY AND VERIFIED WITH: T. GREEN PHARMD, AT [phone] 05/07/16 BY D. VANHOOK Performed at [facility], [place] [address], [place], [place] [address]    Culture GRAM POSITIVE COCCI    Report Status PENDING   Blood Culture ID Panel (Reflexed)     Status: Abnormal   Collection Time: 05/18/2016  5:35 PM  Result Value Ref Range   Enterococcus species NOT DETECTED NOT DETECTED   Listeria monocytogenes NOT DETECTED NOT DETECTED   Staphylococcus species DETECTED (A) NOT DETECTED    Comment: CRITICAL RESULT CALLED TO, READ BACK BY AND VERIFIED WITH: T. GREEN PHARMD, AT [phone] 05/07/16 BY D. VANHOOK    Staphylococcus aureus DETECTED (A) NOT DETECTED    Comment: Methicillin (oxacillin)-resistant  Staphylococcus aureus (MRSA). MRSA is predictably resistant to beta-lactam antibiotics (except ceftaroline). Preferred therapy is vancomycin unless clinically contraindicated. Patient requires contact precautions if  hospitalized. CRITICAL RESULT CALLED TO, READ BACK BY AND VERIFIED WITH: T. GREEN PHARMD, AT [phone] 05/07/16 BY D. VANHOOK    Methicillin resistance DETECTED (A) NOT DETECTED    Comment: CRITICAL RESULT CALLED TO, READ BACK BY AND VERIFIED WITH: T. GREEN PHARMD, AT [phone] 05/07/16 BY D. VANHOOK    Streptococcus species NOT DETECTED NOT DETECTED   Streptococcus agalactiae NOT DETECTED NOT DETECTED   Streptococcus pneumoniae NOT DETECTED NOT DETECTED   Streptococcus pyogenes NOT DETECTED NOT DETECTED   Acinetobacter baumannii NOT DETECTED NOT DETECTED   Enterobacteriaceae species NOT DETECTED NOT DETECTED   Enterobacter cloacae complex NOT DETECTED NOT DETECTED   Escherichia coli NOT DETECTED NOT DETECTED   Klebsiella oxytoca NOT DETECTED NOT DETECTED   Klebsiella pneumoniae NOT DETECTED NOT DETECTED   Proteus species NOT DETECTED NOT DETECTED   Serratia marcescens NOT DETECTED NOT DETECTED   Haemophilus influenzae NOT DETECTED NOT DETECTED   Neisseria meningitidis NOT DETECTED NOT DETECTED   Pseudomonas aeruginosa NOT DETECTED NOT DETECTED   Candida albicans NOT DETECTED NOT DETECTED   Candida glabrata NOT DETECTED NOT DETECTED   Candida krusei NOT DETECTED NOT DETECTED   Candida parapsilosis NOT DETECTED NOT DETECTED   Candida tropicalis NOT DETECTED NOT DETECTED    Comment: Performed at [facility], [address] [place], [place] [address]  Rapid HIV screen (HIV 1/2 Ab+Ag)     Status: None   Collection Time: 05/27/2016  6:18 PM  Result Value Ref Range   HIV-1 P24 Antigen - HIV24 NON REACTIVE NON REACTIVE    Comment: RESULT CALLED TO, READ BACK BY AND VERIFIED WITH: K RAND RN @ 2109 ON 05/27/2016 BY C DAVIS CORRECTED ON 02/03 AT 2106: PREVIOUSLY REPORTED AS NEGATIVE     HIV 1/2 Antibodies NON REACTIVE NON REACTIVE    Comment: RESULT CALLED TO, READ BACK BY AND VERIFIED WITH: K RAND RN @ 2109 ON 05/11/2016 BY C DAVIS    Interpretation (HIV Ag Ab)      A non reactive test result means that HIV 1 or HIV 2 antibodies and HIV 1 p24 antigen were not detected in the specimen.  I-Stat CG4 Lactic Acid, ED     Status: None   Collection Time: 05/23/2016  6:29 PM  Result Value Ref Range   Lactic Acid, Venous 1.66 0.5 - 1.9 mmol/L  Procalcitonin     Status: None   Collection Time: 05/27/2016  8:17 PM  Result Value  Ref Range   Procalcitonin 12.05 ng/mL    Comment:        Interpretation: PCT >= 10 ng/mL: Important systemic inflammatory response, almost exclusively due to severe bacterial sepsis or septic shock. (NOTE)         ICU PCT Algorithm               Non ICU PCT Algorithm    ----------------------------     ------------------------------         PCT < 0.25 ng/mL                 PCT < 0.1 ng/mL     Stopping of antibiotics            Stopping of antibiotics       strongly encouraged.               strongly encouraged.    ----------------------------     ------------------------------       PCT level decrease by               PCT < 0.25 ng/mL       >= 80% from peak PCT       OR PCT 0.25 - 0.5 ng/mL          Stopping of antibiotics                                             encouraged.     Stopping of antibiotics           encouraged.    ----------------------------     ------------------------------       PCT level decrease by              PCT >= 0.25 ng/mL       < 80% from peak PCT        AND PCT >= 0.5 ng/mL             Continuing antibiotics                                              encouraged.       Continuing antibiotics            encouraged.    ----------------------------     ------------------------------     PCT level increase compared          PCT > 0.5 ng/mL         with peak PCT AND          PCT >= 0.5 ng/mL             Escalation of  antibiotics                                          strongly encouraged.      Escalation of antibiotics        strongly encouraged.   Hepatic function panel     Status: Abnormal   Collection Time: 05/08/2016  8:17 PM  Result Value Ref Range   Total Protein 6.4 (L) 6.5 - 8.1 g/dL   Albumin 2.1 (L) 3.5 - 5.0  g/dL   AST 12 (L) 15 - 41 U/L   ALT 7 (L) 14 - 54 U/L   Alkaline Phosphatase 103 38 - 126 U/L   Total Bilirubin 1.5 (H) 0.3 - 1.2 mg/dL   Bilirubin, Direct 0.7 (H) 0.1 - 0.5 mg/dL   Indirect Bilirubin 0.8 0.3 - 0.9 mg/dL  Heparin level (unfractionated)     Status: Abnormal   Collection Time: 05/14/2016  8:17 PM  Result Value Ref Range   Heparin Unfractionated <0.10 (L) 0.30 - 0.70 IU/mL    Comment:        IF HEPARIN RESULTS ARE BELOW EXPECTED VALUES, AND PATIENT DOSAGE HAS BEEN CONFIRMED, SUGGEST FOLLOW UP TESTING OF ANTITHROMBIN III LEVELS.   DIC (disseminated intravasc coag) panel     Status: Abnormal   Collection Time: 05/14/2016  8:23 PM  Result Value Ref Range   Prothrombin Time 15.7 (H) 11.4 - 15.2 seconds   INR 1.24    aPTT 30 24 - 36 seconds   Fibrinogen 511 (H) 210 - 475 mg/dL   D-Dimer, Quant 5.87 (H) 0.00 - 0.50 ug/mL-FEU    Comment: (NOTE) At the manufacturer cut-off of 0.50 ug/mL FEU, this assay has been documented to exclude PE with a sensitivity and negative predictive value of 97 to 99%.  At this time, this assay has not been approved by the FDA to exclude DVT/VTE. Results should be correlated with clinical presentation.    Platelets 73 (L) 150 - 400 K/uL    Comment: REPEATED TO VERIFY SPECIMEN CHECKED FOR CLOTS PLATELET COUNT CONFIRMED BY SMEAR    Smear Review NO SCHISTOCYTES SEEN   MRSA PCR Screening     Status: None   Collection Time: 05/07/16 12:57 AM  Result Value Ref Range   MRSA by PCR NEGATIVE NEGATIVE    Comment:        The GeneXpert MRSA Assay (FDA approved for NASAL specimens only), is one component of a comprehensive MRSA  colonization surveillance program. It is not intended to diagnose MRSA infection nor to guide or monitor treatment for MRSA infections.   Glucose, capillary     Status: Abnormal   Collection Time: 05/07/16  7:54 AM  Result Value Ref Range   Glucose-Capillary 216 (H) 65 - 99 mg/dL  Magnesium     Status: None   Collection Time: 05/07/16  9:06 AM  Result Value Ref Range   Magnesium 2.2 1.7 - 2.4 mg/dL  Comprehensive metabolic panel     Status: Abnormal   Collection Time: 05/07/16  9:06 AM  Result Value Ref Range   Sodium 129 (L) 135 - 145 mmol/L   Potassium 3.2 (L) 3.5 - 5.1 mmol/L   Chloride 101 101 - 111 mmol/L   CO2 18 (L) 22 - 32 mmol/L   Glucose, Bld 168 (H) 65 - 99 mg/dL   BUN 62 (H) 6 - 20 mg/dL   Creatinine, Ser 2.78 (H) 0.44 - 1.00 mg/dL   Calcium 7.3 (L) 8.9 - 10.3 mg/dL   Total Protein 6.2 (L) 6.5 - 8.1 g/dL   Albumin 2.0 (L) 3.5 - 5.0 g/dL   AST 14 (L) 15 - 41 U/L   ALT 8 (L) 14 - 54 U/L   Alkaline Phosphatase 91 38 - 126 U/L   Total Bilirubin 0.9 0.3 - 1.2 mg/dL   GFR calc non Af Amer 22 (L) >60 mL/min   GFR calc Af Amer 26 (L) >60 mL/min    Comment: (NOTE) The eGFR has been  calculated using the CKD EPI equation. This calculation has not been validated in all clinical situations. eGFR's persistently <60 mL/min signify possible Chronic Kidney Disease.    Anion gap 10 5 - 15  CBC WITH DIFFERENTIAL     Status: Abnormal   Collection Time: 05/07/16  9:06 AM  Result Value Ref Range   WBC 12.7 (H) 4.0 - 10.5 K/uL   RBC 3.68 (L) 3.87 - 5.11 MIL/uL   Hemoglobin 10.1 (L) 12.0 - 15.0 g/dL   HCT 29.4 (L) 36.0 - 46.0 %   MCV 79.9 78.0 - 100.0 fL   MCH 27.4 26.0 - 34.0 pg   MCHC 34.4 30.0 - 36.0 g/dL   RDW 19.5 (H) 11.5 - 15.5 %   Platelets 73 (L) 150 - 400 K/uL    Comment: REPEATED TO VERIFY CONSISTENT WITH PREVIOUS RESULT    Neutrophils Relative % 78 %   Lymphocytes Relative 11 %   Monocytes Relative 9 %   Eosinophils Relative 1 %   Basophils Relative 1 %    Neutro Abs 10.0 (H) 1.7 - 7.7 K/uL   Lymphs Abs 1.4 0.7 - 4.0 K/uL   Monocytes Absolute 1.1 (H) 0.1 - 1.0 K/uL   Eosinophils Absolute 0.1 0.0 - 0.7 K/uL   Basophils Absolute 0.1 0.0 - 0.1 K/uL   RBC Morphology POLYCHROMASIA PRESENT     Comment: TARGET CELLS   WBC Morphology MILD LEFT SHIFT (1-5% METAS, OCC MYELO, OCC BANDS)     Comment: SPECIMEN CHECKED FOR CLOTS PLATELET COUNT CONFIRMED BY SMEAR    Smear Review PLATELET COUNT CONFIRMED BY SMEAR   Heparin level (unfractionated)     Status: Abnormal   Collection Time: 05/07/16  9:06 AM  Result Value Ref Range   Heparin Unfractionated <0.10 (L) 0.30 - 0.70 IU/mL    Comment:        IF HEPARIN RESULTS ARE BELOW EXPECTED VALUES, AND PATIENT DOSAGE HAS BEEN CONFIRMED, SUGGEST FOLLOW UP TESTING OF ANTITHROMBIN III LEVELS.   Culture, sputum-assessment     Status: None (Preliminary result)   Collection Time: 05/07/16  1:22 PM  Result Value Ref Range   Specimen Description SPUTUM    Special Requests Immunocompromised    Sputum evaluation THIS SPECIMEN IS ACCEPTABLE FOR SPUTUM CULTURE    Report Status PENDING       Component Value Date/Time   SDES SPUTUM 05/07/2016 1322   SPECREQUEST Immunocompromised 05/07/2016 1322   CULT GRAM POSITIVE COCCI 05/19/2016 1735   REPTSTATUS PENDING 05/07/2016 1322   Dg Chest 2 View  Result Date: 05/25/2016 CLINICAL DATA:  Per EMS- Patient reports that she has been sleeping on the ground x 2 days outside of a hotel. Patient also c/o falling 2 days ago and is having right shoulder, neck pain and generalized body aches; main pain in right shoulder; hx PNA; smoker; EXAM: CHEST  2 VIEW COMPARISON:  Chest x-ray dated 04/09/2016 FINDINGS: Heart size and mediastinal contours are normal. Patchy bilateral airspace opacities. No pleural effusion or pneumothorax seen. Osseous and soft tissue structures about the chest are unremarkable. IMPRESSION: Patchy bilateral airspace opacities, most likely multifocal pneumonia.  Differential would also include septic emboli and asymmetric edema. Electronically Signed   By: Franki Cabot M.D.   On: 05/05/2016 18:01   Dg Lumbar Spine Complete  Result Date: 05/15/2016 CLINICAL DATA:  Per EMS- Patient reports that she has been sleeping on the ground x 2 days outside of a hotel. Patient also c/o falling 2 days ago  and is having right shoulder, neck pain and generalized body aches; main pain in right shoulder; hx PNA; smoker; EXAM: LUMBAR SPINE - COMPLETE 4+ VIEW COMPARISON:  None. FINDINGS: There is no evidence of lumbar spine fracture. Alignment is normal. Intervertebral disc spaces are maintained. Mildly prominent gas-filled loops of small bowel within the upper pelvis, of uncertain significance. IMPRESSION: 1. No bony abnormality. 2. Mildly prominent gas-filled loops of small bowel within the upper pelvis, of uncertain significance, incompletely imaged. Consider dedicated plain film of the abdomen for further characterization. Electronically Signed   By: Franki Cabot M.D.   On: 05/29/2016 18:03   Dg Shoulder Right  Result Date: 05/14/2016 CLINICAL DATA:  Per EMS- Patient reports that she has been sleeping on the ground x 2 days outside of a hotel. Patient also c/o falling 2 days ago and is having right shoulder, neck pain and generalized body aches; main pain in right shoulder; hx PNA; smoker; EXAM: RIGHT SHOULDER - 2+ VIEW COMPARISON:  None. FINDINGS: There is no evidence of fracture or dislocation. There is no evidence of arthropathy or other focal bone abnormality. Patchy infiltrates are identified within the visualized portion of the lung apices. IMPRESSION: No evidence for acute fracture or subluxation of the right shoulder. Bilateral pulmonary infiltrates. Electronically Signed   By: Nolon Nations M.D.   On: 05/10/2016 18:00   Ct Chest Wo Contrast  Result Date: 05/13/2016 CLINICAL DATA:  Septic emboli versus TB.  Heroin abuse. EXAM: CT CHEST WITHOUT CONTRAST TECHNIQUE:  Multidetector CT imaging of the chest was performed following the standard protocol without IV contrast. COMPARISON:  Chest CT dated 03/22/2016. FINDINGS: Cardiovascular: Heart size is normal. No pericardial effusion seen. Thoracic aorta appears grossly normal in caliber and configuration. Mediastinum/Nodes: Several moderate-sized lymph nodes are seen within the anterior mediastinum and right paratracheal region, largest measuring approximately 1.5 cm short axis dimension. Enlarged lymph nodes also noted within the left hilum, measuring up to 1.6 cm short axis dimension. Trachea and central bronchi are unremarkable. Lungs/Pleura: Scattered consolidations again noted throughout both lungs, some of which are new, largest within the right upper lobe measuring 3.4 cm greatest dimension and within the lingula measuring approximately 3.2 cm. Many of the consolidations again demonstrate central cavitation. Upper Abdomen: Probable splenomegaly Musculoskeletal: No acute or suspicious osseous finding. IMPRESSION: 1. Scattered consolidations throughout both lungs, some new compared to the earlier chest CT, majority located peripherally, many with central cavitation. As suggested on the earlier CT report, findings are compatible with septic emboli. Given the upper lobe cavitary consolidations and mediastinal lymphadenopathy, tuberculosis cannot be excluded. Differential would also include atypical pneumonias such as fungal and viral. 2. Mediastinal lymphadenopathy, as detailed above. 3. Probable splenomegaly. Electronically Signed   By: Franki Cabot M.D.   On: 05/08/2016 20:42   Ct Cervical Spine Wo Contrast  Result Date: 05/11/2016 CLINICAL DATA:  Pain after fall EXAM: CT CERVICAL SPINE WITHOUT CONTRAST TECHNIQUE: Multidetector CT imaging of the cervical spine was performed without intravenous contrast. Multiplanar CT image reconstructions were also generated. COMPARISON:  None. FINDINGS: Alignment: Normal. Skull base and  vertebrae: No acute fracture. No primary bone lesion or focal pathologic process. Soft tissues and spinal canal: No prevertebral fluid or swelling. No visible canal hematoma. Disc levels:  No degenerative disc disease. Upper chest: Opacities, some of which are nodular, in the lung apices. Several of these opacities and nodules demonstrate cavitation. Other: No other abnormalities. IMPRESSION: 1. Opacities and nodules in the apices with cavitation. Given  the history of heroin abuse, this could represent septic emboli. However, atypical infections including tuberculosis should be considered. 2. No fracture or malalignment in the cervical spine. Findings called to the patient's PA, Eliezer Mccoy Electronically Signed   By: Dorise Bullion III M.D   On: 05/04/2016 17:27   US Renal  Result Date: 05/07/2016 CLINICAL DATA:  31 year old female with acute renal failure. Septic pulmonary emboli. Heroin abuse. Initial encounter. EXAM: RENAL / URINARY TRACT ULTRASOUND COMPLETE COMPARISON:  Chest CT without contrast 05/15/2016. Renal ultrasound 11/17/2015 and earlier. FINDINGS: Right Kidney: Length: 13.7 cm (13.8 cm previously). I so echogenic to liver as on the August ultrasound. No hydronephrosis or right renal mass. Left Kidney: Length: 13.8 cm (previously 13.9 cm). Stable with similar echogenicity to the right kidney. No hydronephrosis or left renal mass. Bladder: Appears normal for degree of bladder distention. IMPRESSION: No acute renal findings. Mildly increased bilateral renal cortical echogenicity compatible with chronic medical renal disease. Electronically Signed   By: Genevie Ann M.D.   On: 05/07/2016 10:44   Dg Hip Unilat W Or Wo Pelvis 2-3 Views Right  Result Date: 05/31/2016 CLINICAL DATA:  Pain.  Fall 2 days ago. EXAM: DG HIP (WITH OR WITHOUT PELVIS) 2-3V RIGHT COMPARISON:  None. FINDINGS: There is no evidence of hip fracture or dislocation. There is no evidence of arthropathy or other focal bone abnormality.  IMPRESSION: Negative. Electronically Signed   By: Dorise Bullion III M.D   On: 05/31/2016 17:59   Recent Results (from the past 240 hour(s))  Blood culture (routine x 2)     Status: None (Preliminary result)   Collection Time: 05/25/2016  5:35 PM  Result Value Ref Range Status   Specimen Description BLOOD LEFT ARM  Final   Special Requests IN PEDIATRIC BOTTLE 2CC  Final   Culture  Setup Time   Final    GRAM POSITIVE COCCI IN CLUSTERS IN PEDIATRIC BOTTLE CRITICAL RESULT CALLED TO, READ BACK BY AND VERIFIED WITH: T. GREEN PHARMD, AT 1093 05/07/16 BY Rush Landmark Performed at [facility], [place] [address] [place], [place] [address]    Culture GRAM POSITIVE COCCI  Final   Report Status PENDING  Incomplete  Blood Culture ID Panel (Reflexed)     Status: Abnormal   Collection Time: 05/05/2016  5:35 PM  Result Value Ref Range Status   Enterococcus species NOT DETECTED NOT DETECTED Final   Listeria monocytogenes NOT DETECTED NOT DETECTED Final   Staphylococcus species DETECTED (A) NOT DETECTED Final    Comment: CRITICAL RESULT CALLED TO, READ BACK BY AND VERIFIED WITH: T. GREEN PHARMD, AT 3220 05/07/16 BY D. VANHOOK    Staphylococcus aureus DETECTED (A) NOT DETECTED Final    Comment: Methicillin (oxacillin)-resistant Staphylococcus aureus (MRSA). MRSA is predictably resistant to beta-lactam antibiotics (except ceftaroline). Preferred therapy is vancomycin unless clinically contraindicated. Patient requires contact precautions if  hospitalized. CRITICAL RESULT CALLED TO, READ BACK BY AND VERIFIED WITH: T. GREEN PHARMD, AT 2542 05/07/16 BY D. VANHOOK    Methicillin resistance DETECTED (A) NOT DETECTED Final    Comment: CRITICAL RESULT CALLED TO, READ BACK BY AND VERIFIED WITH: T. GREEN PHARMD, AT 7062 05/07/16 BY D. VANHOOK    Streptococcus species NOT DETECTED NOT DETECTED Final   Streptococcus agalactiae NOT DETECTED NOT DETECTED Final   Streptococcus pneumoniae NOT DETECTED NOT DETECTED Final    Streptococcus pyogenes NOT DETECTED NOT DETECTED Final   Acinetobacter baumannii NOT DETECTED NOT DETECTED Final   Enterobacteriaceae species  NOT DETECTED NOT DETECTED Final   Enterobacter cloacae complex NOT DETECTED NOT DETECTED Final   Escherichia coli NOT DETECTED NOT DETECTED Final   Klebsiella oxytoca NOT DETECTED NOT DETECTED Final   Klebsiella pneumoniae NOT DETECTED NOT DETECTED Final   Proteus species NOT DETECTED NOT DETECTED Final   Serratia marcescens NOT DETECTED NOT DETECTED Final   Haemophilus influenzae NOT DETECTED NOT DETECTED Final   Neisseria meningitidis NOT DETECTED NOT DETECTED Final   Pseudomonas aeruginosa NOT DETECTED NOT DETECTED Final   Candida albicans NOT DETECTED NOT DETECTED Final   Candida glabrata NOT DETECTED NOT DETECTED Final   Candida krusei NOT DETECTED NOT DETECTED Final   Candida parapsilosis NOT DETECTED NOT DETECTED Final   Candida tropicalis NOT DETECTED NOT DETECTED Final    Comment: Performed at [facility], [place] [address], [place], [place] [address]  MRSA PCR Screening     Status: None   Collection Time: 05/07/16 12:57 AM  Result Value Ref Range Status   MRSA by PCR NEGATIVE NEGATIVE Final    Comment:        The GeneXpert MRSA Assay (FDA approved for NASAL specimens only), is one component of a comprehensive MRSA colonization surveillance program. It is not intended to diagnose MRSA infection nor to guide or monitor treatment for MRSA infections.   Culture, sputum-assessment     Status: None (Preliminary result)   Collection Time: 05/07/16  1:22 PM  Result Value Ref Range Status   Specimen Description SPUTUM  Final   Special Requests Immunocompromised  Final   Sputum evaluation THIS SPECIMEN IS ACCEPTABLE FOR SPUTUM CULTURE  Final   Report Status PENDING  Incomplete      05/07/2016, 3:23 PM     LOS: 1 day    Records and images were personally reviewed where available.

## 2016-05-07 NOTE — Plan of Care (Signed)
Problem: Pain Managment: Goal: General experience of comfort will improve Outcome: Not Progressing Pt complains of pain 10/10, will not participate in care at times due to pain.

## 2016-05-07 NOTE — Progress Notes (Signed)
PHARMACY - PHYSICIAN COMMUNICATION CRITICAL VALUE ALERT - BLOOD CULTURE IDENTIFICATION (BCID)  Results for orders placed or performed during the hospital encounter of 01-May-2016  Blood Culture ID Panel (Reflexed) (Collected: 05/17/2016  5:35 PM)  Result Value Ref Range   Enterococcus species NOT DETECTED NOT DETECTED   Listeria monocytogenes NOT DETECTED NOT DETECTED   Staphylococcus species DETECTED (A) NOT DETECTED   Staphylococcus aureus DETECTED (A) NOT DETECTED   Methicillin resistance DETECTED (A) NOT DETECTED   Streptococcus species NOT DETECTED NOT DETECTED   Streptococcus agalactiae NOT DETECTED NOT DETECTED   Streptococcus pneumoniae NOT DETECTED NOT DETECTED   Streptococcus pyogenes NOT DETECTED NOT DETECTED   Acinetobacter baumannii NOT DETECTED NOT DETECTED   Enterobacteriaceae species NOT DETECTED NOT DETECTED   Enterobacter cloacae complex NOT DETECTED NOT DETECTED   Escherichia coli NOT DETECTED NOT DETECTED   Klebsiella oxytoca NOT DETECTED NOT DETECTED   Klebsiella pneumoniae NOT DETECTED NOT DETECTED   Proteus species NOT DETECTED NOT DETECTED   Serratia marcescens NOT DETECTED NOT DETECTED   Haemophilus influenzae NOT DETECTED NOT DETECTED   Neisseria meningitidis NOT DETECTED NOT DETECTED   Pseudomonas aeruginosa NOT DETECTED NOT DETECTED   Candida albicans NOT DETECTED NOT DETECTED   Candida glabrata NOT DETECTED NOT DETECTED   Candida krusei NOT DETECTED NOT DETECTED   Candida parapsilosis NOT DETECTED NOT DETECTED   Candida tropicalis NOT DETECTED NOT DETECTED    Name of physician (or Provider) Contacted: Dr. Sharl MaLama  Changes to prescribed antibiotics required: none (continue vancomycin and cefepime pending ID consult)  Loralee PacasErin Maribeth Jiles, PharmD, BCPS Pager: [phone] 05/07/2016  9:30 AM

## 2016-05-07 NOTE — Progress Notes (Signed)
Pt refused labs this AM, said she would let them stick her after her pain medicine was given. Labs rescheduled for 8am. Will pass on to day shift nurse.

## 2016-05-07 NOTE — Progress Notes (Signed)
ANTICOAGULATION CONSULT NOTE - Initial Consult  Pharmacy Consult for heparin Indication: hx pulmonary embolus (home Eliquis on hold)  No Known Allergies  Patient Measurements: Height: 5\' 5"  (165.1 cm) Weight: 95 lb (43.1 kg) IBW/kg (Calculated) : 57 Heparin Dosing Weight: 43 kg  Vital Signs: Temp: 98 F (36.7 C) (02/04 0611) Temp Source: Axillary (02/04 0611) BP: 109/70 (02/04 0611) Pulse Rate: 103 (02/04 0611)  Labs:  Recent Labs  2016/06/05 1554 2016/06/05 1626 2016/06/05 2017 2016/06/05 2023 05/07/16 0906  HGB 12.1  --   --   --  10.1*  HCT 34.9*  --   --   --  29.4*  PLT 81*  --   --  73* 73*  APTT  --   --   --  30  --   LABPROT  --  14.5  --  15.7*  --   INR  --  1.12  --  1.24  --   HEPARINUNFRC  --   --  <0.10*  --  <0.10*  CREATININE 2.91*  --   --   --  2.78*  CKTOTAL 16*  --   --   --   --     Estimated Creatinine Clearance: 20.5 mL/min (by C-G formula based on SCr of 2.78 mg/dL (H)).   Medical History: Past Medical History:  Diagnosis Date  . Anemia   . CAP (community acquired pneumonia) 11/16/2015  . Childhood asthma   . Endocarditis   . Enterococcal bacteremia   . Heroin abuse   . Hyponatremia   . Pneumonia 2007  . 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  Hip and shoulder xrays and certical CT with no fractures noted. Patient has AKI on admission-- scr 2.91 (crcl~20).  To transition Eliquis to heparin drip.  Of note, platelet on admission was 81K (was in the ~200K a month ago).  No bleeding noted. MD suspects thrombocytopenia may be secondary to sepsis.  Today, 05/07/2016  Heparin level < 0.1 on 600 unit/hr (no bolus given d/t low platelets)  Hgb decreased to  10.1 overnight, Plts unchanged at 73  No complications with IV or bleeding reported per discussion with RN  SCr remains elevated at 2.78   Goal of Therapy:  Heparin level 0.3-0.7 units/ml Monitor platelets by anticoagulation protocol: Yes   Plan:   Heparin 1000 units IV bolus  Increase IV heparin infusion to 1000 units/hr  Check heparin level in 8hrs  Daily heparin level and CBC  Loralee PacasErin Darelle Kings, PharmD, BCPS Pager: [phone] 05/07/2016,10:19 AM

## 2016-05-07 NOTE — Progress Notes (Signed)
ANTICOAGULATION CONSULT NOTE - Follow Up Consult  Pharmacy Consult for Heparin Indication: hx pulmonary embolus (PTA Eliquis on hold)  No Known Allergies  Patient Measurements: Height: 5\' 5"  (165.1 cm) Weight: 95 lb (43.1 kg) IBW/kg (Calculated) : 57 Heparin Dosing Weight:   Vital Signs: Temp: 99.3 F (37.4 C) (02/04 1958) Temp Source: Oral (02/04 1958) BP: 103/63 (02/04 1958) Pulse Rate: 119 (02/04 1958)  Labs:  Recent Labs  22-May-2016 1554 22-May-2016 1626 22-May-2016 2017 22-May-2016 2023 05/07/16 0906 05/07/16 2009  HGB 12.1  --   --   --  10.1*  --   HCT 34.9*  --   --   --  29.4*  --   PLT 81*  --   --  73* 73*  --   APTT  --   --   --  30  --   --   LABPROT  --  14.5  --  15.7*  --   --   INR  --  1.12  --  1.24  --   --   HEPARINUNFRC  --   --  <0.10*  --  <0.10* <0.10*  CREATININE 2.91*  --   --   --  2.78*  --   CKTOTAL 16*  --   --   --   --   --     Estimated Creatinine Clearance: 20.5 mL/min (by C-G formula based on SCr of 2.78 mg/dL (H)).   Medications:  Infusions:  . heparin 1,000 Units/hr (05/07/16 1044)    Assessment: Patient with low heparin level again after rate increase.  No bolus due to low PLT.  IV site issues, resulting in heparin being held while q24hr vancomycin infused, per MD orders per RN.  Low heparin level drawn before heparin stopped.  Goal of Therapy:  Heparin level 0.3-0.7 units/ml Monitor platelets by anticoagulation protocol: Yes   Plan:  Increase heparin to 1250 units/hr Recheck level at 0600 2/5  Aleene DavidsonGrimsley Jr, Arrin Ishler Crowford 05/07/2016,9:28 PM

## 2016-05-07 NOTE — Plan of Care (Signed)
Problem: Pain Managment: Goal: General experience of comfort will improve Outcome: Not Progressing Pt still c/o 10/10 pain  Problem: Activity: Goal: Risk for activity intolerance will decrease Outcome: Not Progressing Pt refusing to participate in certain aspects of medical care  Problem: Nutrition: Goal: Adequate nutrition will be maintained Outcome: Not Progressing Pt refusing to eat

## 2016-05-07 NOTE — Progress Notes (Signed)
[facility]  PROGRESS NOTE  Catherine Hernandez VWU:[id number] DOB: January 11, 1986 DOA: 05/07/2016 PCP: No PCP Per Patient   Brief HPI:    31 y.o. female with medical history significant for IV drug abuse, endocarditis involving tricuspid valve, and nonadherence with her treatment plan, now presenting to the emergency department with generalized aches and pains, malaise, productive cough, and fatigue. Patient reports falling onto her right side approximately 2 days ago, denies striking her head or losing consciousness, but notes severe generalized pain, particularly in her neck and right shoulder. The patient is also had worsening in her productive cough and dyspnea, and reports a general malaise and fatigue. She has had a complicated recent medical course involving hospitalization in December 2017 with Serratia bacteremia, bilateral pulmonary emboli, and tricuspid valve endocarditis. She was evaluated by CT surgery at that time and deemed to not be a surgical candidate. She was treated with antibiotics in the hospital and discharged to complete a course of Cipro. She returned to the hospital with the starting of January 2018 with sepsis secondary to HCAP and noted to have cavitary pulmonary lesions. She was treated with empiric vancomycin and cefepime in the hospital and per ID consultant, she was not a PICC candidate and was discharged with 1 month of Bactrim which she never filled patient came back to the hospital and found to be afebrile chest x-ray showed patchy bilateral air space opacities likely representing multifocal pneumonia #2 septic emboli or asymmetric edema also possibility.. CT of the cervical spine is negative for acute fracture or malalignment, but notable for opacities and nodules in the lung apices with cavitation, possibly representing septic emboli or tuberculosis.  Subjective   This morning patient complains of pain under the ribs on both sides.   Assessment/Plan:     1. MRSA  bacteremia- blood cultures in 1/2 bottles growing gram-positive cocci in clusters, MRSA suspected. Patient is currently on vancomycin. 2. Healthcare associated pneumonia, cavitary lung disease- CT showed bilateral cavitary lesions, likely septic emboli. Given the upper lobe cavitation and mediastinal adenopathy, TB cannot be excluded.. Patient was not a PICC line candidate at recent discharge due to ongoing IV drug abuse and was discharged to complete one month of Bactrim but it was never filled. AFB cultures obtained. Continue airborne precautions. We will continue with vancomycin and cefepime. 3. Endocarditis- TTE performed in December 17 showed 1 cm mobile mass on tricuspid valve consistent with vegetation. CTS saw the patient and deemed not a surgical candidate. Continue empiric antibiotics as above. 4. Acute kidney injury- patient baseline creatinine 0.6 in January 2018, now came with creatinine of 2.91. Today it has improved to 2.73. Follow BMP in a.m. renal ultrasound showed no acute renal findings. 5. Hypokalemia- potassium is 3.2, will replace potassium and check BMP in a.m. 6. Thrombocytopenia- platelet count 73, likely due to infectious process. Will follow CBC in a.m. 7. History of pulmonary embolism- patient was diagnosed with bilateral PEs in December 2017 and has been on eliquis, patient started on IV heparin infusion per pharmacy consultation due to renal failure. 8. Polysubstance abuse- patient has ongoing IV drug abuse. UDS positive for cocaine, opiates, benzodiazepines. Social work consultation requested.    DVT prophylaxis: Patient on heparin  Code Status: *Full code  Family Communication: No family present at bedside   Disposition Plan: Pending improvement in multiple medical problems   Consultants:  *none   Procedures:  None   Continuous infusions . heparin 1,000 Units/hr (05/07/16 1044)  Antibiotics:   Anti-infectives    Start     Dose/Rate Route  Frequency Ordered Stop   05/07/16 2000  vancomycin (VANCOCIN) 500 mg in sodium chloride 0.9 % 100 mL IVPB     500 mg 100 mL/hr over 60 Minutes Intravenous Every 24 hours 05-19-16 1824     05/07/16 2000  ceFEPIme (MAXIPIME) 1 g in dextrose 5 % 50 mL IVPB     1 g 100 mL/hr over 30 Minutes Intravenous Every 24 hours May 19, 2016 1824     05-19-2016 1800  ceFEPIme (MAXIPIME) 2 g in dextrose 5 % 50 mL IVPB     2 g 100 mL/hr over 30 Minutes Intravenous  Once 19-May-2016 1754 05/19/16 1944   2016/05/19 1800  vancomycin (VANCOCIN) IVPB 1000 mg/200 mL premix     1,000 mg 200 mL/hr over 60 Minutes Intravenous  Once May 19, 2016 1754 2016/05/19 2030       Objective   Vitals:   May 19, 2016 2211 05-19-16 2311 05/07/16 0241 05/07/16 0611  BP: 96/65 117/73  109/70  Pulse: 116 (!) 123 (!) 103 (!) 103  Resp: 26 (!) 40  (!) 30  Temp:  99.1 F (37.3 C)  98 F (36.7 C)  TempSrc:  Oral  Axillary  SpO2: 98% 99%  100%  Weight:      Height:  5\' 5"  (1.651 m)      Intake/Output Summary (Last 24 hours) at 05/07/16 1350 Last data filed at 05/07/16 1044  Gross per 24 hour  Intake          2396.13 ml  Output              300 ml  Net          2096.13 ml   Filed Weights   05-19-16 1412  Weight: 43.1 kg (95 lb)     Physical Examination:  General exam: Appears calm and comfortable. Respiratory system: Bilateral rhonchi auscultated Cardiovascular system:  RRR. No  murmurs, rubs, gallops. No pedal edema. GI system: Abdomen is nondistended, positive tenderness in both right and left upper quadrant. No organomegaly.  Central nervous system. No focal neurological deficits. 5 x 5 power in all extremities. Skin: Excoriations noted in the extremities and trunk.Marland Kitchen Psychiatry: Alert, oriented x 3.Judgement and insight appear normal. Affect normal.    Data Reviewed: I have personally reviewed following labs and imaging studies  CBG:  Recent Labs Lab 05/07/16 0754  GLUCAP 216*    CBC:  Recent Labs Lab  May 19, 2016 1554 May 19, 2016 2023 05/07/16 0906  WBC 16.2*  --  12.7*  NEUTROABS 13.9*  --  10.0*  HGB 12.1  --  10.1*  HCT 34.9*  --  29.4*  MCV 78.8  --  79.9  PLT 81* 73* 73*    Basic Metabolic Panel:  Recent Labs Lab 05/19/2016 1554 05/07/16 0906  NA 127* 129*  K 3.4* 3.2*  CL 94* 101  CO2 17* 18*  GLUCOSE 119* 168*  BUN 64* 62*  CREATININE 2.91* 2.78*  CALCIUM 7.8* 7.3*  MG  --  2.2    Recent Results (from the past 240 hour(s))  Blood culture (routine x 2)     Status: None (Preliminary result)   Collection Time: 05-19-2016  5:35 PM  Result Value Ref Range Status   Specimen Description BLOOD LEFT ARM  Final   Special Requests IN PEDIATRIC BOTTLE 2CC  Final   Culture  Setup Time   Final    GRAM POSITIVE COCCI IN CLUSTERS  IN PEDIATRIC BOTTLE CRITICAL RESULT CALLED TO, READ BACK BY AND VERIFIED WITH: T. GREEN PHARMD, AT 4098 05/07/16 BY D. VANHOOK Performed at [facility], [address] [place], [place] [address]    Culture GRAM POSITIVE COCCI  Final   Report Status PENDING  Incomplete  Blood Culture ID Panel (Reflexed)     Status: Abnormal   Collection Time: 05/12/2016  5:35 PM  Result Value Ref Range Status   Enterococcus species NOT DETECTED NOT DETECTED Final   Listeria monocytogenes NOT DETECTED NOT DETECTED Final   Staphylococcus species DETECTED (A) NOT DETECTED Final    Comment: CRITICAL RESULT CALLED TO, READ BACK BY AND VERIFIED WITH: T. GREEN PHARMD, AT 7829 05/07/16 BY D. VANHOOK    Staphylococcus aureus DETECTED (A) NOT DETECTED Final    Comment: Methicillin (oxacillin)-resistant Staphylococcus aureus (MRSA). MRSA is predictably resistant to beta-lactam antibiotics (except ceftaroline). Preferred therapy is vancomycin unless clinically contraindicated. Patient requires contact precautions if  hospitalized. CRITICAL RESULT CALLED TO, READ BACK BY AND VERIFIED WITH: T. GREEN PHARMD, AT 5621 05/07/16 BY D. VANHOOK    Methicillin resistance DETECTED (A) NOT  DETECTED Final    Comment: CRITICAL RESULT CALLED TO, READ BACK BY AND VERIFIED WITH: T. GREEN PHARMD, AT 3086 05/07/16 BY D. VANHOOK    Streptococcus species NOT DETECTED NOT DETECTED Final   Streptococcus agalactiae NOT DETECTED NOT DETECTED Final   Streptococcus pneumoniae NOT DETECTED NOT DETECTED Final   Streptococcus pyogenes NOT DETECTED NOT DETECTED Final   Acinetobacter baumannii NOT DETECTED NOT DETECTED Final   Enterobacteriaceae species NOT DETECTED NOT DETECTED Final   Enterobacter cloacae complex NOT DETECTED NOT DETECTED Final   Escherichia coli NOT DETECTED NOT DETECTED Final   Klebsiella oxytoca NOT DETECTED NOT DETECTED Final   Klebsiella pneumoniae NOT DETECTED NOT DETECTED Final   Proteus species NOT DETECTED NOT DETECTED Final   Serratia marcescens NOT DETECTED NOT DETECTED Final   Haemophilus influenzae NOT DETECTED NOT DETECTED Final   Neisseria meningitidis NOT DETECTED NOT DETECTED Final   Pseudomonas aeruginosa NOT DETECTED NOT DETECTED Final   Candida albicans NOT DETECTED NOT DETECTED Final   Candida glabrata NOT DETECTED NOT DETECTED Final   Candida krusei NOT DETECTED NOT DETECTED Final   Candida parapsilosis NOT DETECTED NOT DETECTED Final   Candida tropicalis NOT DETECTED NOT DETECTED Final    Comment: Performed at [facility], [address], [place], [place] [address]  MRSA PCR Screening     Status: None   Collection Time: 05/07/16 12:57 AM  Result Value Ref Range Status   MRSA by PCR NEGATIVE NEGATIVE Final    Comment:        The GeneXpert MRSA Assay (FDA approved for NASAL specimens only), is one component of a comprehensive MRSA colonization surveillance program. It is not intended to diagnose MRSA infection nor to guide or monitor treatment for MRSA infections.      Liver Function Tests:  Recent Labs Lab 05/17/2016 2017 05/07/16 0906  AST 12* 14*  ALT 7* 8*  ALKPHOS 103 91  BILITOT 1.5* 0.9  PROT 6.4* 6.2*  ALBUMIN 2.1*  2.0*   No results for input(s): LIPASE, AMYLASE in the last 168 hours. No results for input(s): AMMONIA in the last 168 hours.  Cardiac Enzymes:  Recent Labs Lab 05/31/2016 1554  CKTOTAL 16*   BNP (last 3 results)  Recent Labs  11/16/15 2030 04/03/16 0021  BNP 215.3* 89.9    ProBNP (last 3  results) No results for input(s): PROBNP in the last 8760 hours.    Studies: Dg Chest 2 View  Result Date: 05/19/2016 CLINICAL DATA:  Per EMS- Patient reports that she has been sleeping on the ground x 2 days outside of a hotel. Patient also c/o falling 2 days ago and is having right shoulder, neck pain and generalized body aches; main pain in right shoulder; hx PNA; smoker; EXAM: CHEST  2 VIEW COMPARISON:  Chest x-ray dated 04/09/2016 FINDINGS: Heart size and mediastinal contours are normal. Patchy bilateral airspace opacities. No pleural effusion or pneumothorax seen. Osseous and soft tissue structures about the chest are unremarkable. IMPRESSION: Patchy bilateral airspace opacities, most likely multifocal pneumonia. Differential would also include septic emboli and asymmetric edema. Electronically Signed   By: Bary Richard M.D.   On: 05/10/2016 18:01   Dg Lumbar Spine Complete  Result Date: 05/23/2016 CLINICAL DATA:  Per EMS- Patient reports that she has been sleeping on the ground x 2 days outside of a hotel. Patient also c/o falling 2 days ago and is having right shoulder, neck pain and generalized body aches; main pain in right shoulder; hx PNA; smoker; EXAM: LUMBAR SPINE - COMPLETE 4+ VIEW COMPARISON:  None. FINDINGS: There is no evidence of lumbar spine fracture. Alignment is normal. Intervertebral disc spaces are maintained. Mildly prominent gas-filled loops of small bowel within the upper pelvis, of uncertain significance. IMPRESSION: 1. No bony abnormality. 2. Mildly prominent gas-filled loops of small bowel within the upper pelvis, of uncertain significance, incompletely imaged. Consider  dedicated plain film of the abdomen for further characterization. Electronically Signed   By: Bary Richard M.D.   On: 05/15/2016 18:03   Dg Shoulder Right  Result Date: 05/31/2016 CLINICAL DATA:  Per EMS- Patient reports that she has been sleeping on the ground x 2 days outside of a hotel. Patient also c/o falling 2 days ago and is having right shoulder, neck pain and generalized body aches; main pain in right shoulder; hx PNA; smoker; EXAM: RIGHT SHOULDER - 2+ VIEW COMPARISON:  None. FINDINGS: There is no evidence of fracture or dislocation. There is no evidence of arthropathy or other focal bone abnormality. Patchy infiltrates are identified within the visualized portion of the lung apices. IMPRESSION: No evidence for acute fracture or subluxation of the right shoulder. Bilateral pulmonary infiltrates. Electronically Signed   By: Norva Pavlov M.D.   On: 05/05/2016 18:00   Ct Chest Wo Contrast  Result Date: 05/07/2016 CLINICAL DATA:  Septic emboli versus TB.  Heroin abuse. EXAM: CT CHEST WITHOUT CONTRAST TECHNIQUE: Multidetector CT imaging of the chest was performed following the standard protocol without IV contrast. COMPARISON:  Chest CT dated 03/22/2016. FINDINGS: Cardiovascular: Heart size is normal. No pericardial effusion seen. Thoracic aorta appears grossly normal in caliber and configuration. Mediastinum/Nodes: Several moderate-sized lymph nodes are seen within the anterior mediastinum and right paratracheal region, largest measuring approximately 1.5 cm short axis dimension. Enlarged lymph nodes also noted within the left hilum, measuring up to 1.6 cm short axis dimension. Trachea and central bronchi are unremarkable. Lungs/Pleura: Scattered consolidations again noted throughout both lungs, some of which are new, largest within the right upper lobe measuring 3.4 cm greatest dimension and within the lingula measuring approximately 3.2 cm. Many of the consolidations again demonstrate central  cavitation. Upper Abdomen: Probable splenomegaly Musculoskeletal: No acute or suspicious osseous finding. IMPRESSION: 1. Scattered consolidations throughout both lungs, some new compared to the earlier chest CT, majority located peripherally, many with central  cavitation. As suggested on the earlier CT report, findings are compatible with septic emboli. Given the upper lobe cavitary consolidations and mediastinal lymphadenopathy, tuberculosis cannot be excluded. Differential would also include atypical pneumonias such as fungal and viral. 2. Mediastinal lymphadenopathy, as detailed above. 3. Probable splenomegaly. Electronically Signed   By: Bary RichardStan  Maynard M.D.   On: 2016/07/16 20:42   Ct Cervical Spine Wo Contrast  Result Date: 05/04/2016 CLINICAL DATA:  Pain after fall EXAM: CT CERVICAL SPINE WITHOUT CONTRAST TECHNIQUE: Multidetector CT imaging of the cervical spine was performed without intravenous contrast. Multiplanar CT image reconstructions were also generated. COMPARISON:  None. FINDINGS: Alignment: Normal. Skull base and vertebrae: No acute fracture. No primary bone lesion or focal pathologic process. Soft tissues and spinal canal: No prevertebral fluid or swelling. No visible canal hematoma. Disc levels:  No degenerative disc disease. Upper chest: Opacities, some of which are nodular, in the lung apices. Several of these opacities and nodules demonstrate cavitation. Other: No other abnormalities. IMPRESSION: 1. Opacities and nodules in the apices with cavitation. Given the history of heroin abuse, this could represent septic emboli. However, atypical infections including tuberculosis should be considered. 2. No fracture or malalignment in the cervical spine. Findings called to the patient's PA, Buel ReamAlexandra Law Electronically Signed   By: Gerome Samavid  Williams III M.D   On: 2016/07/16 17:27   [place] Renal  Result Date: 05/07/2016 CLINICAL DATA:  31 year old female with acute renal failure. Septic pulmonary emboli.  Heroin abuse. Initial encounter. EXAM: RENAL / URINARY TRACT ULTRASOUND COMPLETE COMPARISON:  Chest CT without contrast 2016/07/16. Renal ultrasound 11/17/2015 and earlier. FINDINGS: Right Kidney: Length: 13.7 cm (13.8 cm previously). I so echogenic to liver as on the August ultrasound. No hydronephrosis or right renal mass. Left Kidney: Length: 13.8 cm (previously 13.9 cm). Stable with similar echogenicity to the right kidney. No hydronephrosis or left renal mass. Bladder: Appears normal for degree of bladder distention. IMPRESSION: No acute renal findings. Mildly increased bilateral renal cortical echogenicity compatible with chronic medical renal disease. Electronically Signed   By: Odessa FlemingH  Hall M.D.   On: 05/07/2016 10:44   Dg Hip Unilat W Or Wo Pelvis 2-3 Views Right  Result Date: 05/10/2016 CLINICAL DATA:  Pain.  Fall 2 days ago. EXAM: DG HIP (WITH OR WITHOUT PELVIS) 2-3V RIGHT COMPARISON:  None. FINDINGS: There is no evidence of hip fracture or dislocation. There is no evidence of arthropathy or other focal bone abnormality. IMPRESSION: Negative. Electronically Signed   By: Gerome Samavid  Williams III M.D   On: 2016/07/16 17:59    Scheduled Meds: . ceFEPime (MAXIPIME) IV  1 g Intravenous Q24H  . feeding supplement (ENSURE ENLIVE)  237 mL Oral BID BM  . sodium chloride flush  3 mL Intravenous Q12H  . vancomycin  500 mg Intravenous Q24H      Time spent: 25 min  [facility] HospitalAMA,Jerri Hargadon S   [facility] Pager [phone]. If 7PM-7AM, please contact night-coverage at [email or web address], Office  [phone]  password [email or web address] 05/07/2016, 1:50 PM  LOS: 1 day

## 2016-05-08 ENCOUNTER — Inpatient Hospital Stay (HOSPITAL_COMMUNITY): Payer: Medicaid Other

## 2016-05-08 DIAGNOSIS — R7881 Bacteremia: Secondary | ICD-10-CM | POA: Diagnosis present

## 2016-05-08 DIAGNOSIS — B9562 Methicillin resistant Staphylococcus aureus infection as the cause of diseases classified elsewhere: Secondary | ICD-10-CM | POA: Diagnosis present

## 2016-05-08 LAB — COMPREHENSIVE METABOLIC PANEL
ALBUMIN: 2 g/dL — AB (ref 3.5–5.0)
ALT: 8 U/L — AB (ref 14–54)
AST: 14 U/L — AB (ref 15–41)
Alkaline Phosphatase: 95 U/L (ref 38–126)
Anion gap: 9 (ref 5–15)
BUN: 53 mg/dL — ABNORMAL HIGH (ref 6–20)
CHLORIDE: 103 mmol/L (ref 101–111)
CO2: 18 mmol/L — AB (ref 22–32)
Calcium: 7.6 mg/dL — ABNORMAL LOW (ref 8.9–10.3)
Creatinine, Ser: 2.38 mg/dL — ABNORMAL HIGH (ref 0.44–1.00)
GFR calc non Af Amer: 27 mL/min — ABNORMAL LOW (ref >60)
GFR, EST AFRICAN AMERICAN: 31 mL/min — AB (ref >60)
GLUCOSE: 96 mg/dL (ref 65–99)
Potassium: 4.1 mmol/L (ref 3.5–5.1)
SODIUM: 130 mmol/L — AB (ref 135–145)
Total Bilirubin: 0.7 mg/dL (ref 0.3–1.2)
Total Protein: 7 g/dL (ref 6.5–8.1)

## 2016-05-08 LAB — CBC
HCT: 35.3 % — ABNORMAL LOW (ref 36.0–46.0)
Hemoglobin: 11.7 g/dL — ABNORMAL LOW (ref 12.0–15.0)
MCH: 27.1 pg (ref 26.0–34.0)
MCHC: 33.1 g/dL (ref 30.0–36.0)
MCV: 81.7 fL (ref 78.0–100.0)
Platelets: 73 10*3/uL — ABNORMAL LOW (ref 150–400)
RBC: 4.32 MIL/uL (ref 3.87–5.11)
RDW: 19.7 % — ABNORMAL HIGH (ref 11.5–15.5)
WBC: 12.7 10*3/uL — ABNORMAL HIGH (ref 4.0–10.5)

## 2016-05-08 LAB — ECHOCARDIOGRAM COMPLETE
AVLVOTPG: 6 mmHg
FS: 36 % (ref 28–44)
Height: 65 in
IVS/LV PW RATIO, ED: 0.84
LA ID, A-P, ES: 27 mm
LA diam end sys: 27 mm
LA diam index: 1.94 cm/m2
LAVOLA4C: 22.9 mL
LDCA: 2.84 cm2
LV e' LATERAL: 25 cm/s
LVOT VTI: 17.2 cm
LVOT diameter: 19 mm
LVOTPV: 122 cm/s
LVOTSV: 49 mL
PW: 9.24 mm — AB (ref 0.6–1.1)
RV LATERAL S' VELOCITY: 17.2 cm/s
RV TAPSE: 24.8 mm
Reg peak vel: 346 cm/s
TDI e' lateral: 25
TDI e' medial: 12.6
TR max vel: 346 cm/s
Weight: 1520 oz

## 2016-05-08 LAB — GLUCOSE, CAPILLARY: GLUCOSE-CAPILLARY: 92 mg/dL (ref 65–99)

## 2016-05-08 LAB — URINE CULTURE: Culture: <10000 — AB

## 2016-05-08 LAB — HEPARIN LEVEL (UNFRACTIONATED): Heparin Unfractionated: <0.1 IU/mL — ABNORMAL LOW (ref 0.30–0.70)

## 2016-05-08 LAB — UREA NITROGEN, URINE: Urea Nitrogen, Ur: 529 mg/dL

## 2016-05-08 MED ORDER — HEPARIN BOLUS VIA INFUSION
1500.0000 [IU] | Freq: Once | INTRAVENOUS | Status: AC
  Administered 2016-05-08: 1500 [IU] via INTRAVENOUS
  Filled 2016-05-08: qty 1500

## 2016-05-08 MED ORDER — APIXABAN 5 MG PO TABS
5.0000 mg | ORAL_TABLET | Freq: Two times a day (BID) | ORAL | Status: DC
  Administered 2016-05-08 – 2016-05-15 (×15): 5 mg via ORAL
  Filled 2016-05-08 (×15): qty 1

## 2016-05-08 MED ORDER — HEPARIN (PORCINE) IN NACL 100-0.45 UNIT/ML-% IJ SOLN
1450.0000 [IU]/h | INTRAMUSCULAR | Status: DC
  Administered 2016-05-08: 1450 [IU]/h via INTRAVENOUS

## 2016-05-08 NOTE — Progress Notes (Addendum)
ANTICOAGULATION CONSULT NOTE - Follow up Consult  Pharmacy Consult for heparin Indication: hx pulmonary embolus (home Eliquis on hold)  No Known Allergies  Patient Measurements: Height: 5\' 5"  (165.1 cm) Weight: 95 lb (43.1 kg) IBW/kg (Calculated) : 57 Heparin Dosing Weight: 43 kg  Vital Signs: Temp: 98.4 F (36.9 C) (02/05 0508) Temp Source: Oral (02/05 0508) BP: 101/62 (02/05 0508) Pulse Rate: 113 (02/05 0508)  Labs:  Recent Labs  05/21/2016 1554 05/10/2016 1626 05/31/2016 2017 05/15/2016 2023 05/07/16 0906 05/07/16 2009 05/08/16 0625  HGB 12.1  --   --   --  10.1*  --  11.7*  HCT 34.9*  --   --   --  29.4*  --  35.3*  PLT 81*  --   --  73* 73*  --  73*  APTT  --   --   --  30  --   --   --   LABPROT  --  14.5  --  15.7*  --   --   --   INR  --  1.12  --  1.24  --   --   --   HEPARINUNFRC  --   --  <0.10*  --  <0.10* <0.10*  --   CREATININE 2.91*  --   --   --  2.78*  --  2.38*  CKTOTAL 16*  --   --   --   --   --   --     Estimated Creatinine Clearance: 23.9 mL/min (by C-G formula based on SCr of 2.38 mg/dL (H)).   Medications:  Home regimen: Eliquis 5 mg BID (reported last dose taken was 2 weeks ago) Infusions:   . heparin 1,250 Units/hr (05/08/16 0328)   Assessment: Patient is a 31 y.o homeless F with hx substance abuse and bilateral PEs (03/22/16) on Eliquis PTA, presented to the ED on 05/22/2016 with c/o right shoulder and neck pain s/p fall 2 days prior to admission.  Hip and shoulder xrays and certical CT with no fractures noted. Patient has AKI on admission-- scr 2.91 (crcl~20).  Pharmacy is consulted to transition Eliquis to heparin drip.  Of note, platelet on admission was 81K (was in the ~200K a month ago).  No bleeding noted. MD suspects thrombocytopenia may be secondary to sepsis.  Significant events: 2/4PM  Per RN, heparin being stopped for 60 min q24 hrs while vanc infuses (incompatible, only 1 IV site available)  Today, 05/08/2016  Heparin level < 0.1,  remains undetectable on Heparin 1250 units/hr  CBC:  Hgb improved to 11.7 overnight, Plts unchanged at 73  No complications with IV or bleeding reported per discussion with RN.  Heparin infusion has not been interrupted since holding (2/4 at 20:30 for 1 hour) for vancomycin.  Current IV is infusing.  SCr 2.38, remains elevated but improved   Goal of Therapy:  Heparin level 0.3-0.7 units/ml Monitor platelets by anticoagulation protocol: Yes   Plan:  Give heparin 1500 units bolus IV x 1 Increase to heparin IV infusion at 1450 units/hr Heparin level 8 hours after rate change Daily heparin level and CBC  Continue to monitor for s/s bleeding, thrombosis  Lynann Beaverhristine Jalynne Persico PharmD, BCPS Pager [phone] 05/08/2016 7:19 AM   Addendum:  Pharmacy is consulted to resume apixaban.  SCr 2.38, remains elevated but improving. Plan:  Discontinue Heparin IV  Resume Apixaban 5mg  PO BID  Closely monitor renal function for improvement  Monitor CBC due to thrombocytopenia.  Lynann Beaverhristine Cristan Hout PharmD, BCPS  Pager [phone] 05/08/2016 12:38 PM

## 2016-05-08 NOTE — Progress Notes (Signed)
Initial Nutrition Assessment  DOCUMENTATION CODES:   Severe malnutrition in context of chronic illness, Underweight  INTERVENTION:   Continue Ensure Enlive po BID, each supplement provides 350 kcal and 20 grams of protein  NUTRITION DIAGNOSIS:   Malnutrition related to chronic illness as evidenced by severe depletion of body fat, severe depletion of muscle mass.  GOAL:   Patient will meet greater than or equal to 90% of their needs  MONITOR:   PO intake, Supplement acceptance, Labs, Weight trends, I & O's  REASON FOR ASSESSMENT:   Malnutrition Screening Tool    ASSESSMENT:   31 y.o.femalewith medical history significant for IV drug abuse, endocarditis involving tricuspid valve, and nonadherence with her treatment plan, now presenting to the emergency department with generalized aches and pains, malaise, productive cough, and fatigue.   Patient currently refusing meals and supplements. Pt with history of IVDU (cocaine, opiates, benzos) and severe malnutrition. Pt with history of homelessness.  Pt's weight continues to stay around 95 lb. Pt has been ordered ensure supplements. NFPE consistent with previous admissions, severe muscle and fat depletion. Will continue to monitor for acceptance of intakes and supplements.  Medications reviewed. Labs reviewed:  Low Na  Diet Order:  Diet regular Room service appropriate? Yes; Fluid consistency: Thin  Skin:  Reviewed, no issues  Last BM:  PTA  Height:   Ht Readings from Last 1 Encounters:  2017-03-04 5\' 5"  (1.651 m)    Weight:   Wt Readings from Last 1 Encounters:  2017-03-04 95 lb (43.1 kg)    Ideal Body Weight:  56.8 kg  BMI:  Body mass index is 15.81 kg/m.  Estimated Nutritional Needs:   Kcal:  1500-1700  Protein:  80-90g  Fluid:  1.5-1.7L/day  EDUCATION NEEDS:   No education needs identified at this time  Tilda FrancoLindsey Lesta Limbert, MS, RD, LDN Pager: [phone] After Hours Pager: [phone]

## 2016-05-08 NOTE — Progress Notes (Signed)
  Echocardiogram 2D Echocardiogram has been performed.  Catherine Hernandez 05/08/2016, 2:00 PM

## 2016-05-08 NOTE — Progress Notes (Signed)
Patient refusing IV restart at this time.  Pt has 24 ga catheter which is bent but currently usable in her R pinky.

## 2016-05-08 NOTE — Progress Notes (Signed)
Patient ID: Catherine Hernandez, female   DOB: 05-10-1985, 31 y.o.   MRN: [id number]          [facility]  Date of Admission:  May 24, 2016           Day 3 vancomycin  Principal Problem:   Endocarditis of tricuspid valve Active Problems:   HCAP (healthcare-associated pneumonia)   MRSA bacteremia   Staphylococcus aureus bacteremia   Serratia infection   Polysubstance abuse   IVDU (intravenous drug user)   Opioid use disorder, severe, dependence (HCC)   Hyponatremia   Thrombocytopenia (HCC)   Cavitary lung disease   Acute kidney injury (HCC)   History of pulmonary embolism   . apixaban  5 mg Oral BID  . feeding supplement (ENSURE ENLIVE)  237 mL Oral BID BM  . sodium chloride flush  3 mL Intravenous Q12H  . vancomycin  500 mg Intravenous Q24H    SUBJECTIVE: Tanga was readmitted for the fifth time since August of last year when she first presented with tricuspid valve endocarditis and septic pulmonary emboli. Her blood cultures and August grew MSSA and enterococcus. Blood cultures in December of grew Serratia. And admission blood cultures this time are growing MRSA. Her TTE shows enlargement of the tricuspid valve vegetation. There are some new nodular and cavitary lesion seen on chest CT. She is coughing up blood tinged sputum.  Review of Systems: Review of Systems  Constitutional: Positive for malaise/fatigue and weight loss. Negative for chills, diaphoresis and fever.  HENT: Negative for sore throat.   Respiratory: Positive for cough, hemoptysis, sputum production and shortness of breath.   Cardiovascular: Positive for chest pain.  Gastrointestinal: Negative for abdominal pain, diarrhea, heartburn, nausea and vomiting.  Genitourinary: Negative for dysuria and frequency.  Musculoskeletal: Negative for joint pain and myalgias.  Skin: Negative for rash.  Neurological: Negative for dizziness and headaches.  Psychiatric/Behavioral: Positive for depression  and substance abuse. The patient is not nervous/anxious.     Past Medical History:  Diagnosis Date  . Anemia   . CAP (community acquired pneumonia) 11/16/2015  . Childhood asthma   . Endocarditis   . Enterococcal bacteremia   . Heroin abuse   . Hyponatremia   . Pneumonia 2007  . Polysubstance abuse     Social History  Substance Use Topics  . Smoking status: Current Every Day Smoker    Packs/day: 0.50    Years: 10.00    Types: Cigarettes  . Smokeless tobacco: Never Used  . Alcohol use No    Family History  Problem Relation Age of Onset  . Anesthesia problems Neg Hx   . Hypotension Neg Hx   . Malignant hyperthermia Neg Hx   . Pseudochol deficiency Neg Hx    No Known Allergies  OBJECTIVE: Vitals:   05/07/16 1406 05/07/16 1958 05/08/16 0508 05/08/16 1545  BP: 120/64 103/63 101/62 121/81  Pulse: (!) 114 (!) 119 (!) 113 (!) 113  Resp: (!) 24 20 (!) 24 20  Temp: 98.4 F (36.9 C) 99.3 F (37.4 C) 98.4 F (36.9 C) 99.1 F (37.3 C)  TempSrc: Oral Oral Oral Oral  SpO2: 96% 94% 96% 94%  Weight:      Height:       Body mass index is 15.81 kg/m.  Physical Exam  Constitutional: She is oriented to person, place, and time.  She is crying and moaning. She is cachectic.  HENT:  Mouth/Throat: No oropharyngeal exudate.  Poor dentition.  Eyes: Conjunctivae  are normal.  Cardiovascular: Normal rate and regular rhythm.   No murmur heard. Pulmonary/Chest: Effort normal. She has wheezes. She has rales.  Abdominal: Soft. There is no tenderness.  Musculoskeletal: Normal range of motion. She exhibits no edema or tenderness.  Neurological: She is alert and oriented to person, place, and time.  Skin:  Diffuse, excoriated lesions and scars. Multiple tattoos.    Lab Results Lab Results  Component Value Date   WBC 12.7 (H) 05/08/2016   HGB 11.7 (L) 05/08/2016   HCT 35.3 (L) 05/08/2016   MCV 81.7 05/08/2016   PLT 73 (L) 05/08/2016    Lab Results  Component Value Date    CREATININE 2.38 (H) 05/08/2016   BUN 53 (H) 05/08/2016   NA 130 (L) 05/08/2016   K 4.1 05/08/2016   CL 103 05/08/2016   CO2 18 (L) 05/08/2016    Lab Results  Component Value Date   ALT 8 (L) 05/08/2016   AST 14 (L) 05/08/2016   ALKPHOS 95 05/08/2016   BILITOT 0.7 05/08/2016     Microbiology: Recent Results (from the past 240 hour(s))  Urine culture     Status: Abnormal   Collection Time: 05-15-16  3:27 PM  Result Value Ref Range Status   Specimen Description URINE, RANDOM  Final   Special Requests NONE  Final   Culture (A)  Final    <10,000 COLONIES/mL INSIGNIFICANT GROWTH Performed at [facility], [address] [place], [place] [address]    Report Status 05/08/2016 FINAL  Final  Blood culture (routine x 2)     Status: Abnormal (Preliminary result)   Collection Time: 2016/05/15  5:35 PM  Result Value Ref Range Status   Specimen Description BLOOD LEFT ARM  Final   Special Requests IN PEDIATRIC BOTTLE 2CC  Final   Culture  Setup Time   Final    GRAM POSITIVE COCCI IN CLUSTERS IN PEDIATRIC BOTTLE CRITICAL RESULT CALLED TO, READ BACK BY AND VERIFIED WITH: T. GREEN PHARMD, AT 6045 05/07/16 BY D. VANHOOK    Culture (A)  Final    STAPHYLOCOCCUS AUREUS SUSCEPTIBILITIES TO FOLLOW Performed at [facility], [address] [place], [place] [address]    Report Status PENDING  Incomplete  Blood Culture ID Panel (Reflexed)     Status: Abnormal   Collection Time: May 15, 2016  5:35 PM  Result Value Ref Range Status   Enterococcus species NOT DETECTED NOT DETECTED Final   Listeria monocytogenes NOT DETECTED NOT DETECTED Final   Staphylococcus species DETECTED (A) NOT DETECTED Final    Comment: CRITICAL RESULT CALLED TO, READ BACK BY AND VERIFIED WITH: T. GREEN PHARMD, AT 1914 05/07/16 BY D. VANHOOK    Staphylococcus aureus DETECTED (A) NOT DETECTED Final    Comment: Methicillin (oxacillin)-resistant Staphylococcus aureus (MRSA). MRSA is predictably resistant to beta-lactam  antibiotics (except ceftaroline). Preferred therapy is vancomycin unless clinically contraindicated. Patient requires contact precautions if  hospitalized. CRITICAL RESULT CALLED TO, READ BACK BY AND VERIFIED WITH: T. GREEN PHARMD, AT 7829 05/07/16 BY D. VANHOOK    Methicillin resistance DETECTED (A) NOT DETECTED Final    Comment: CRITICAL RESULT CALLED TO, READ BACK BY AND VERIFIED WITH: T. GREEN PHARMD, AT 5621 05/07/16 BY D. VANHOOK    Streptococcus species NOT DETECTED NOT DETECTED Final   Streptococcus agalactiae NOT DETECTED NOT DETECTED Final   Streptococcus pneumoniae NOT DETECTED NOT DETECTED Final   Streptococcus pyogenes NOT DETECTED NOT DETECTED Final   Acinetobacter baumannii NOT DETECTED  NOT DETECTED Final   Enterobacteriaceae species NOT DETECTED NOT DETECTED Final   Enterobacter cloacae complex NOT DETECTED NOT DETECTED Final   Escherichia coli NOT DETECTED NOT DETECTED Final   Klebsiella oxytoca NOT DETECTED NOT DETECTED Final   Klebsiella pneumoniae NOT DETECTED NOT DETECTED Final   Proteus species NOT DETECTED NOT DETECTED Final   Serratia marcescens NOT DETECTED NOT DETECTED Final   Haemophilus influenzae NOT DETECTED NOT DETECTED Final   Neisseria meningitidis NOT DETECTED NOT DETECTED Final   Pseudomonas aeruginosa NOT DETECTED NOT DETECTED Final   Candida albicans NOT DETECTED NOT DETECTED Final   Candida glabrata NOT DETECTED NOT DETECTED Final   Candida krusei NOT DETECTED NOT DETECTED Final   Candida parapsilosis NOT DETECTED NOT DETECTED Final   Candida tropicalis NOT DETECTED NOT DETECTED Final    Comment: Performed at [facility], [address] [place], [place] [address]  MRSA PCR Screening     Status: None   Collection Time: 05/07/16 12:57 AM  Result Value Ref Range Status   MRSA by PCR NEGATIVE NEGATIVE Final    Comment:        The GeneXpert MRSA Assay (FDA approved for NASAL specimens only), is one component of a comprehensive MRSA  colonization surveillance program. It is not intended to diagnose MRSA infection nor to guide or monitor treatment for MRSA infections.   Culture, sputum-assessment     Status: None   Collection Time: 05/07/16  1:22 PM  Result Value Ref Range Status   Specimen Description SPUTUM  Final   Special Requests Immunocompromised  Final   Sputum evaluation THIS SPECIMEN IS ACCEPTABLE FOR SPUTUM CULTURE  Final   Report Status 05/07/2016 FINAL  Final  Culture, respiratory (NON-Expectorated)     Status: None (Preliminary result)   Collection Time: 05/07/16  1:22 PM  Result Value Ref Range Status   Specimen Description SPUTUM  Final   Special Requests Immunocompromised Reflexed from [id number]  Final   Gram Stain   Final    ABUNDANT WBC PRESENT, PREDOMINANTLY PMN MODERATE YEAST MODERATE GRAM POSITIVE RODS    Culture   Final    CULTURE REINCUBATED FOR BETTER GROWTH Performed at [facility], [address] [place], [place] [address]    Report Status PENDING  Incomplete  Culture, blood (Routine X 2) w Reflex to ID Panel     Status: None (Preliminary result)   Collection Time: 05/08/16  6:23 AM  Result Value Ref Range Status   Specimen Description BLOOD LEFT HAND  Final   Special Requests   Final    IN PEDIATRIC BOTTLE 0.5CC Performed at [facility], [address] [place], [place] [address]    Culture PENDING  Incomplete   Report Status PENDING  Incomplete   ASSESSMENT: She continues to struggle with tricuspid valve endocarditis and septic pulmonary emboli with multiple episodes of bacteremia with different organisms. I will continue vancomycin and repeat blood cultures. She desperately needs placement in a skilled nursing facility for 4-6 weeks of IV vancomycin therapy. She became tearful when I suggested this and said she could not go to a facility outside of [place] because she would not be able to see her son.  PLAN: 1. Continue vancomycin 2. Repeat blood  cultures 3. I will follow-up on Wednesday, 05/10/2016  Cliffton AstersJohn Ameris Akamine, MD [facility] [phone] pager   [phone] cell 05/08/2016, 4:51 PM

## 2016-05-08 NOTE — Progress Notes (Signed)
  Echocardiogram 2D Echocardiogram has been performed. Patient is extremely noncompliant during length of exam.  Catherine Hernandez L Androw 05/08/2016, 2:10 PM

## 2016-05-08 NOTE — Progress Notes (Addendum)
[facility]  PROGRESS NOTE  Margretta Dittyngela N Wyke ZOX:[id number] DOB: [birth year] DOA: 05/10/2016 PCP: No PCP Per Patient   Brief HPI:    31 y.o. female with medical history significant for IV drug abuse, endocarditis involving tricuspid valve, and nonadherence with her treatment plan, now presenting to the emergency department with generalized aches and pains, malaise, productive cough, and fatigue.   She has had a complicated recent medical course involving hospitalization in December 2017 with Serratia bacteremia, bilateral pulmonary emboli, and tricuspid valve endocarditis. She was evaluated by CT surgery at that time and deemed to not be a surgical candidate.She was treated with antibiotics in the hospital and discharged to complete a course of Cipro. She returned to the hospital with the starting of January 2018 with sepsis secondary to HCAP and noted to have cavitary pulmonary lesions. She was treated with empiric vancomycin and cefepime in the hospital and per ID consultant, she was not a PICC candidate and was discharged with 1 month of Bactrim which she never filled patient came back to the hospital and found to be afebrile chest x-ray showed patchy bilateral air space opacities likely representing multifocal pneumonia #2 septic emboli or asymmetric edema also possibility.. CT of the cervical spine notable for opacities and nodules in the lung apices with cavitation Blood Cx 1/2 with MRSA now  Subjective  C/o cough, weakness, pain all over   Assessment/Plan:   1. Sepsis/MRSA bacteremia- blood cultures in 1/2 bottles  With MRSA from 2/3 -repeat cultures pending -ID consulting, continuing Vanc/stopped Cefepime, non complaint with recent Serratia endocarditis of tricuspid valve -2D ECHO ordered  2. Recent TV endocarditis with Severe TR, multifocal pneumonia/septic embolization -treated with IV Vanc/cefepime inpatient -seen by CVTS, not a surgical candidate and Palliative consult obtained  last admission, medical rx pursued but didn't fill script for bactrim she was discharged on in January -Patient was not a PICC line candidate at recent discharge due to ongoing IV drug abuse -suspect progression of disease -ID following -start with TTE, vegetations noted on 2D echo last few times  3. Multifocal pneumonia/Septic embolization from IE -with evolution to cavitary lung disease -now on Iv Vanc, ID following -FU sputum Cx -clinically do not suspect TB, will FU AFB smears  4. Acute kidney injury- patient baseline creatinine 0.6 in January 2018, admitted with creatinine of 2.91. - emboli to kidneys and Bactrim contributing to AKI are possibilities - creatinine down to 2.3, continue IVF - Caution with Vanc dosing per PharmD -no hydronephrosis on Renal US  5. Hypokalemia- -replace  6. Thrombocytopenia- platelet count 73, likely due to sepsis, Infection/Abx -was normal at last DC -Stop heparin and resume Eliquis she was on previously for h/o PE  7. History of pulmonary embolism -- patient was diagnosed with bilateral PEs in December 2017 and has been on eliquis, since creatinine improving will ask PharmD to transition back to gfr adjusted dose of Eliquis  8. Polysubstance abuse- patient has ongoing IV drug abuse. UDS positive for cocaine, opiates, benzodiazepines. Social work consultation requested.  DVT prophylaxis: Eliquis Code Status: *Full code Family Communication: No family present at bedside   Disposition Plan: Pending medical stability   Consultants:  ID  Procedures:  None   Continuous infusions     Antibiotics:   Anti-infectives    Start     Dose/Rate Route Frequency Ordered Stop   05/07/16 2000  vancomycin (VANCOCIN) 500 mg in sodium chloride 0.9 % 100 mL IVPB     500 mg 100  mL/hr over 60 Minutes Intravenous Every 24 hours 05/20/2016 1824     05/07/16 2000  ceFEPIme (MAXIPIME) 1 g in dextrose 5 % 50 mL IVPB  Status:  Discontinued     1 g 100  mL/hr over 30 Minutes Intravenous Every 24 hours 2016/05/20 1824 05/07/16 1546   May 20, 2016 1800  ceFEPIme (MAXIPIME) 2 g in dextrose 5 % 50 mL IVPB     2 g 100 mL/hr over 30 Minutes Intravenous  Once 05-20-16 1754 05-20-16 1944   05-20-2016 1800  vancomycin (VANCOCIN) IVPB 1000 mg/200 mL premix     1,000 mg 200 mL/hr over 60 Minutes Intravenous  Once 05/20/2016 1754 2016/05/20 2030       Objective   Vitals:   05/07/16 0611 05/07/16 1406 05/07/16 1958 05/08/16 0508  BP: 109/70 120/64 103/63 101/62  Pulse: (!) 103 (!) 114 (!) 119 (!) 113  Resp: (!) 30 (!) 24 20 (!) 24  Temp: 98 F (36.7 C) 98.4 F (36.9 C) 99.3 F (37.4 C) 98.4 F (36.9 C)  TempSrc: Axillary Oral Oral Oral  SpO2: 100% 96% 94% 96%  Weight:      Height:        Intake/Output Summary (Last 24 hours) at 05/08/16 1139 Last data filed at 05/08/16 0600  Gross per 24 hour  Intake              579 ml  Output                0 ml  Net              579 ml   Filed Weights   2016-05-20 1412  Weight: 43.1 kg (95 lb)     Physical Examination:  General exam: thinly built, cachectic Respiratory system: Bilateral rhonchi auscultated Cardiovascular system:  RRR, systolic murmur noted, tachycardic GI system: Abdomen is nondistended, positive tenderness in both right and left upper quadrant. No organomegaly.  Central nervous system. No focal neurological deficits. 5 x 5 power in all extremities. Skin: Excoriations noted in the extremities and trunk.Marland Kitchen Psychiatry: flat affect    Data Reviewed: I have personally reviewed following labs and imaging studies  CBG:  Recent Labs Lab 05/07/16 0754 05/08/16 0758  GLUCAP 216* 92    CBC:  Recent Labs Lab 05-20-16 1554 May 20, 2016 2023 05/07/16 0906 05/08/16 0625  WBC 16.2*  --  12.7* 12.7*  NEUTROABS 13.9*  --  10.0*  --   HGB 12.1  --  10.1* 11.7*  HCT 34.9*  --  29.4* 35.3*  MCV 78.8  --  79.9 81.7  PLT 81* 73* 73* 73*    Basic Metabolic Panel:  Recent Labs Lab  2016/05/20 1554 05/07/16 0906 05/08/16 0625  NA 127* 129* 130*  K 3.4* 3.2* 4.1  CL 94* 101 103  CO2 17* 18* 18*  GLUCOSE 119* 168* 96  BUN 64* 62* 53*  CREATININE 2.91* 2.78* 2.38*  CALCIUM 7.8* 7.3* 7.6*  MG  --  2.2  --     Recent Results (from the past 240 hour(s))  Urine culture     Status: Abnormal   Collection Time: May 20, 2016  3:27 PM  Result Value Ref Range Status   Specimen Description URINE, RANDOM  Final   Special Requests NONE  Final   Culture (A)  Final    <10,000 COLONIES/mL INSIGNIFICANT GROWTH Performed at [facility], [address] [place], [place] [address]    Report Status 05/08/2016 FINAL  Final  Blood culture (routine x 2)     Status: Abnormal (Preliminary result)   Collection Time: 2016/06/02  5:35 PM  Result Value Ref Range Status   Specimen Description BLOOD LEFT ARM  Final   Special Requests IN PEDIATRIC BOTTLE 2CC  Final   Culture  Setup Time   Final    GRAM POSITIVE COCCI IN CLUSTERS IN PEDIATRIC BOTTLE CRITICAL RESULT CALLED TO, READ BACK BY AND VERIFIED WITH: T. GREEN PHARMD, AT 1610 05/07/16 BY D. VANHOOK    Culture (A)  Final    STAPHYLOCOCCUS AUREUS SUSCEPTIBILITIES TO FOLLOW Performed at [facility], [address], [place], [place] [address]    Report Status PENDING  Incomplete  Blood Culture ID Panel (Reflexed)     Status: Abnormal   Collection Time: 06-02-2016  5:35 PM  Result Value Ref Range Status   Enterococcus species NOT DETECTED NOT DETECTED Final   Listeria monocytogenes NOT DETECTED NOT DETECTED Final   Staphylococcus species DETECTED (A) NOT DETECTED Final    Comment: CRITICAL RESULT CALLED TO, READ BACK BY AND VERIFIED WITH: T. GREEN PHARMD, AT 4098 05/07/16 BY D. VANHOOK    Staphylococcus aureus DETECTED (A) NOT DETECTED Final    Comment: Methicillin (oxacillin)-resistant Staphylococcus aureus (MRSA). MRSA is predictably resistant to beta-lactam antibiotics (except ceftaroline). Preferred therapy is vancomycin  unless clinically contraindicated. Patient requires contact precautions if  hospitalized. CRITICAL RESULT CALLED TO, READ BACK BY AND VERIFIED WITH: T. GREEN PHARMD, AT 1191 05/07/16 BY D. VANHOOK    Methicillin resistance DETECTED (A) NOT DETECTED Final    Comment: CRITICAL RESULT CALLED TO, READ BACK BY AND VERIFIED WITH: T. GREEN PHARMD, AT 4782 05/07/16 BY D. VANHOOK    Streptococcus species NOT DETECTED NOT DETECTED Final   Streptococcus agalactiae NOT DETECTED NOT DETECTED Final   Streptococcus pneumoniae NOT DETECTED NOT DETECTED Final   Streptococcus pyogenes NOT DETECTED NOT DETECTED Final   Acinetobacter baumannii NOT DETECTED NOT DETECTED Final   Enterobacteriaceae species NOT DETECTED NOT DETECTED Final   Enterobacter cloacae complex NOT DETECTED NOT DETECTED Final   Escherichia coli NOT DETECTED NOT DETECTED Final   Klebsiella oxytoca NOT DETECTED NOT DETECTED Final   Klebsiella pneumoniae NOT DETECTED NOT DETECTED Final   Proteus species NOT DETECTED NOT DETECTED Final   Serratia marcescens NOT DETECTED NOT DETECTED Final   Haemophilus influenzae NOT DETECTED NOT DETECTED Final   Neisseria meningitidis NOT DETECTED NOT DETECTED Final   Pseudomonas aeruginosa NOT DETECTED NOT DETECTED Final   Candida albicans NOT DETECTED NOT DETECTED Final   Candida glabrata NOT DETECTED NOT DETECTED Final   Candida krusei NOT DETECTED NOT DETECTED Final   Candida parapsilosis NOT DETECTED NOT DETECTED Final   Candida tropicalis NOT DETECTED NOT DETECTED Final    Comment: Performed at [facility], [address], [place], [place] [address]  MRSA PCR Screening     Status: None   Collection Time: 05/07/16 12:57 AM  Result Value Ref Range Status   MRSA by PCR NEGATIVE NEGATIVE Final    Comment:        The GeneXpert MRSA Assay (FDA approved for NASAL specimens only), is one component of a comprehensive MRSA colonization surveillance program. It is not intended to diagnose  MRSA infection nor to guide or monitor treatment for MRSA infections.   Culture, sputum-assessment     Status: None   Collection Time: 05/07/16  1:22 PM  Result Value Ref Range Status   Specimen Description SPUTUM  Final   Special Requests Immunocompromised  Final   Sputum evaluation THIS SPECIMEN IS ACCEPTABLE FOR SPUTUM CULTURE  Final   Report Status 05/07/2016 FINAL  Final  Culture, respiratory (NON-Expectorated)     Status: None (Preliminary result)   Collection Time: 05/07/16  1:22 PM  Result Value Ref Range Status   Specimen Description SPUTUM  Final   Special Requests Immunocompromised Reflexed from [id number]  Final   Gram Stain   Final    ABUNDANT WBC PRESENT, PREDOMINANTLY PMN MODERATE YEAST MODERATE GRAM POSITIVE RODS    Culture   Final    CULTURE REINCUBATED FOR BETTER GROWTH Performed at [facility], [address], [place], [place] [address]    Report Status PENDING  Incomplete  Culture, blood (Routine X 2) w Reflex to ID Panel     Status: None (Preliminary result)   Collection Time: 05/08/16  6:23 AM  Result Value Ref Range Status   Specimen Description BLOOD LEFT HAND  Final   Special Requests   Final    IN PEDIATRIC BOTTLE 0.5CC Performed at [facility], [address], [place], [place] [address]    Culture PENDING  Incomplete   Report Status PENDING  Incomplete     Liver Function Tests:  Recent Labs Lab 05-25-16 2017 05/07/16 0906 05/08/16 0625  AST 12* 14* 14*  ALT 7* 8* 8*  ALKPHOS 103 91 95  BILITOT 1.5* 0.9 0.7  PROT 6.4* 6.2* 7.0  ALBUMIN 2.1* 2.0* 2.0*   No results for input(s): LIPASE, AMYLASE in the last 168 hours. No results for input(s): AMMONIA in the last 168 hours.  Cardiac Enzymes:  Recent Labs Lab 05-25-16 1554  CKTOTAL 16*   BNP (last 3 results)  Recent Labs  11/16/15 2030 04/03/16 0021  BNP 215.3* 89.9    ProBNP (last 3 results) No results for input(s): PROBNP in the last 8760  hours.    Studies: Dg Chest 2 View  Result Date: May 25, 2016 CLINICAL DATA:  Per EMS- Patient reports that she has been sleeping on the ground x 2 days outside of a hotel. Patient also c/o falling 2 days ago and is having right shoulder, neck pain and generalized body aches; main pain in right shoulder; hx PNA; smoker; EXAM: CHEST  2 VIEW COMPARISON:  Chest x-ray dated 04/09/2016 FINDINGS: Heart size and mediastinal contours are normal. Patchy bilateral airspace opacities. No pleural effusion or pneumothorax seen. Osseous and soft tissue structures about the chest are unremarkable. IMPRESSION: Patchy bilateral airspace opacities, most likely multifocal pneumonia. Differential would also include septic emboli and asymmetric edema. Electronically Signed   By: Bary Richard M.D.   On: 25-May-2016 18:01   Dg Lumbar Spine Complete  Result Date: 05-25-16 CLINICAL DATA:  Per EMS- Patient reports that she has been sleeping on the ground x 2 days outside of a hotel. Patient also c/o falling 2 days ago and is having right shoulder, neck pain and generalized body aches; main pain in right shoulder; hx PNA; smoker; EXAM: LUMBAR SPINE - COMPLETE 4+ VIEW COMPARISON:  None. FINDINGS: There is no evidence of lumbar spine fracture. Alignment is normal. Intervertebral disc spaces are maintained. Mildly prominent gas-filled loops of small bowel within the upper pelvis, of uncertain significance. IMPRESSION: 1. No bony abnormality. 2. Mildly prominent gas-filled loops of small bowel within the upper pelvis, of uncertain significance, incompletely imaged. Consider dedicated plain film of the abdomen for further characterization. Electronically Signed   By: Bary Richard  M.D.   On: 05/09/2016 18:03   Dg Shoulder Right  Result Date: 05/16/2016 CLINICAL DATA:  Per EMS- Patient reports that she has been sleeping on the ground x 2 days outside of a hotel. Patient also c/o falling 2 days ago and is having right shoulder, neck pain  and generalized body aches; main pain in right shoulder; hx PNA; smoker; EXAM: RIGHT SHOULDER - 2+ VIEW COMPARISON:  None. FINDINGS: There is no evidence of fracture or dislocation. There is no evidence of arthropathy or other focal bone abnormality. Patchy infiltrates are identified within the visualized portion of the lung apices. IMPRESSION: No evidence for acute fracture or subluxation of the right shoulder. Bilateral pulmonary infiltrates. Electronically Signed   By: Norva Pavlov M.D.   On: 05/14/2016 18:00   Ct Chest Wo Contrast  Result Date: 05/21/2016 CLINICAL DATA:  Septic emboli versus TB.  Heroin abuse. EXAM: CT CHEST WITHOUT CONTRAST TECHNIQUE: Multidetector CT imaging of the chest was performed following the standard protocol without IV contrast. COMPARISON:  Chest CT dated 03/22/2016. FINDINGS: Cardiovascular: Heart size is normal. No pericardial effusion seen. Thoracic aorta appears grossly normal in caliber and configuration. Mediastinum/Nodes: Several moderate-sized lymph nodes are seen within the anterior mediastinum and right paratracheal region, largest measuring approximately 1.5 cm short axis dimension. Enlarged lymph nodes also noted within the left hilum, measuring up to 1.6 cm short axis dimension. Trachea and central bronchi are unremarkable. Lungs/Pleura: Scattered consolidations again noted throughout both lungs, some of which are new, largest within the right upper lobe measuring 3.4 cm greatest dimension and within the lingula measuring approximately 3.2 cm. Many of the consolidations again demonstrate central cavitation. Upper Abdomen: Probable splenomegaly Musculoskeletal: No acute or suspicious osseous finding. IMPRESSION: 1. Scattered consolidations throughout both lungs, some new compared to the earlier chest CT, majority located peripherally, many with central cavitation. As suggested on the earlier CT report, findings are compatible with septic emboli. Given the upper lobe  cavitary consolidations and mediastinal lymphadenopathy, tuberculosis cannot be excluded. Differential would also include atypical pneumonias such as fungal and viral. 2. Mediastinal lymphadenopathy, as detailed above. 3. Probable splenomegaly. Electronically Signed   By: Bary Richard M.D.   On: 05/19/2016 20:42   Ct Cervical Spine Wo Contrast  Result Date: 05/12/2016 CLINICAL DATA:  Pain after fall EXAM: CT CERVICAL SPINE WITHOUT CONTRAST TECHNIQUE: Multidetector CT imaging of the cervical spine was performed without intravenous contrast. Multiplanar CT image reconstructions were also generated. COMPARISON:  None. FINDINGS: Alignment: Normal. Skull base and vertebrae: No acute fracture. No primary bone lesion or focal pathologic process. Soft tissues and spinal canal: No prevertebral fluid or swelling. No visible canal hematoma. Disc levels:  No degenerative disc disease. Upper chest: Opacities, some of which are nodular, in the lung apices. Several of these opacities and nodules demonstrate cavitation. Other: No other abnormalities. IMPRESSION: 1. Opacities and nodules in the apices with cavitation. Given the history of heroin abuse, this could represent septic emboli. However, atypical infections including tuberculosis should be considered. 2. No fracture or malalignment in the cervical spine. Findings called to the patient's PA, Buel Ream Electronically Signed   By: Gerome Sam III M.D   On: 05/05/2016 17:27   US Renal  Result Date: 05/07/2016 CLINICAL DATA:  31 year old female with acute renal failure. Septic pulmonary emboli. Heroin abuse. Initial encounter. EXAM: RENAL / URINARY TRACT ULTRASOUND COMPLETE COMPARISON:  Chest CT without contrast 05/24/2016. Renal ultrasound 11/17/2015 and earlier. FINDINGS: Right Kidney: Length: 13.7 cm (13.8  cm previously). I so echogenic to liver as on the August ultrasound. No hydronephrosis or right renal mass. Left Kidney: Length: 13.8 cm (previously 13.9  cm). Stable with similar echogenicity to the right kidney. No hydronephrosis or left renal mass. Bladder: Appears normal for degree of bladder distention. IMPRESSION: No acute renal findings. Mildly increased bilateral renal cortical echogenicity compatible with chronic medical renal disease. Electronically Signed   By: Odessa Fleming M.D.   On: 05/07/2016 10:44   Dg Hip Unilat W Or Wo Pelvis 2-3 Views Right  Result Date: 05/18/2016 CLINICAL DATA:  Pain.  Fall 2 days ago. EXAM: DG HIP (WITH OR WITHOUT PELVIS) 2-3V RIGHT COMPARISON:  None. FINDINGS: There is no evidence of hip fracture or dislocation. There is no evidence of arthropathy or other focal bone abnormality. IMPRESSION: Negative. Electronically Signed   By: Gerome Sam III M.D   On: 05/30/2016 17:59    Scheduled Meds: . feeding supplement (ENSURE ENLIVE)  237 mL Oral BID BM  . sodium chloride flush  3 mL Intravenous Q12H  . vancomycin  500 mg Intravenous Q24H      Time spent: 25 min  Gevorg Brum   [facility] Pager [phone] If 7PM-7AM, please contact night-coverage at [email or web address], Office  [phone]  password [email or web address] 05/08/2016, 11:39 AM  LOS: 2 days

## 2016-05-09 LAB — CULTURE, BLOOD (ROUTINE X 2)

## 2016-05-09 LAB — BASIC METABOLIC PANEL
Anion gap: 11 (ref 5–15)
BUN: 46 mg/dL — AB (ref 6–20)
CHLORIDE: 105 mmol/L (ref 101–111)
CO2: 16 mmol/L — AB (ref 22–32)
CREATININE: 2.18 mg/dL — AB (ref 0.44–1.00)
Calcium: 7.4 mg/dL — ABNORMAL LOW (ref 8.9–10.3)
GFR calc Af Amer: 34 mL/min — ABNORMAL LOW (ref >60)
GFR calc non Af Amer: 30 mL/min — ABNORMAL LOW (ref >60)
Glucose, Bld: 97 mg/dL (ref 65–99)
Potassium: 4.2 mmol/L (ref 3.5–5.1)
SODIUM: 132 mmol/L — AB (ref 135–145)

## 2016-05-09 LAB — CBC
HCT: 28.9 % — ABNORMAL LOW (ref 36.0–46.0)
Hemoglobin: 9.4 g/dL — ABNORMAL LOW (ref 12.0–15.0)
MCH: 27.2 pg (ref 26.0–34.0)
MCHC: 32.5 g/dL (ref 30.0–36.0)
MCV: 83.5 fL (ref 78.0–100.0)
Platelets: 97 10*3/uL — ABNORMAL LOW (ref 150–400)
RBC: 3.46 MIL/uL — ABNORMAL LOW (ref 3.87–5.11)
RDW: 20 % — ABNORMAL HIGH (ref 11.5–15.5)
WBC: 15.3 10*3/uL — ABNORMAL HIGH (ref 4.0–10.5)

## 2016-05-09 LAB — HEPATITIS PANEL, ACUTE
HCV AB: 1 {s_co_ratio} — AB (ref 0.0–0.9)
HEP A IGM: NEGATIVE
HEP B C IGM: NEGATIVE
Hepatitis B Surface Ag: NEGATIVE

## 2016-05-09 LAB — GLUCOSE, CAPILLARY: GLUCOSE-CAPILLARY: 79 mg/dL (ref 65–99)

## 2016-05-09 LAB — VANCOMYCIN, TROUGH: Vancomycin Tr: 10 ug/mL — ABNORMAL LOW (ref 15–20)

## 2016-05-09 MED ORDER — VANCOMYCIN HCL IN DEXTROSE 750-5 MG/150ML-% IV SOLN
750.0000 mg | INTRAVENOUS | Status: DC
  Administered 2016-05-09 – 2016-05-10 (×2): 750 mg via INTRAVENOUS
  Filled 2016-05-09 (×3): qty 150

## 2016-05-09 MED ORDER — LORAZEPAM 2 MG/ML IJ SOLN
0.5000 mg | Freq: Once | INTRAMUSCULAR | Status: AC
  Administered 2016-05-09: 0.5 mg via INTRAVENOUS
  Filled 2016-05-09: qty 1

## 2016-05-09 NOTE — Progress Notes (Signed)
Pharmacy Antibiotic Note  Catherine Hernandez is a 31 y.o. female admitted on 05/22/2016 with sepsis, HCAP, tricuspid valve endocarditis, MRSA bacteremia.  PMH includes serratia bacteremia, IVDU, polysubstance abuse, hyponatremia, thrombocytopenia, AKI, and hx PE.  Pharmacy has been consulted for vancomycin dosing.  Today, 05/09/2016: Day #4 abx BCx still + on 2/5 Admitted with AKI, SCr elevated but improving  WBC elevated/increased Afebrile  PM UPDATE: Vancomycin Trough: 10 mcg/ml (Goal 15-20 mcg/ml)  Plan:  Increase Vancomycin to 750mg  IV q24h  Follow up renal function & cultures, de-escalate as appropriate  Height: 5\' 5"  (165.1 cm) Weight: 91 lb 14.9 oz (41.7 kg) IBW/kg (Calculated) : 57  Temp (24hrs), Avg:98.7 F (37.1 C), Min:98.4 F (36.9 C), Max:98.9 F (37.2 C)   Recent Labs Lab 05/24/2016 1554 05/24/2016 1829 05/07/16 0906 05/08/16 0625 05/09/16 0528 05/09/16 2054  WBC 16.2*  --  12.7* 12.7* 15.3*  --   CREATININE 2.91*  --  2.78* 2.38* 2.18*  --   LATICACIDVEN  --  1.66  --   --   --   --   VANCOTROUGH  --   --   --   --   --  10*    Estimated Creatinine Clearance: 25.3 mL/min (by C-G formula based on SCr of 2.18 mg/dL (H)).    No Known Allergies  Antimicrobials this admission:  2/3 Vanc >> 2/3 Cefepime >>  Dose adjustments this admission:  2/6 Vancomycin trough level = 10 on Vanc 1g x1 then 500mg  q24h (prior to 4th dose) ==> inc to 750mg  IV q24h  Microbiology results:  2/3 BCx: 1/2 Staph aureus, MRSA + 2/3 UCx: insignificant growth  2/3 HIV Ab: NR 2/4 MRSA PCR: negative 2/4 Respiratory: reincubated 2/4 AFB smear/culture: pending 2/5 BCx: 2/2 GPC clusters  Thank you for allowing pharmacy to be a part of this patient's care.  Terrilee FilesLeann Joao Mccurdy, PharmD 05/09/2016 9:40 PM

## 2016-05-09 NOTE — Progress Notes (Signed)
[facility]  PROGRESS NOTE  JALAIYAH THROGMORTON XBJ:[id number] DOB: 1985-08-30 DOA: 05/22/2016 PCP: No PCP Per Patient   Brief HPI:    31 y.o. female with medical history significant for IV drug abuse, endocarditis involving tricuspid valve, and nonadherence with her treatment plan, now presenting to the emergency department with generalized aches and pains, malaise, productive cough, and fatigue.   She has had a complicated recent medical course involving hospitalization in December 2017 with Serratia bacteremia, bilateral pulmonary emboli, and tricuspid valve endocarditis. She was evaluated by CT surgery at that time and deemed to not be a surgical candidate.She was treated with antibiotics in the hospital and discharged to complete a course of Cipro. She returned to the hospital with the starting of January 2018 with sepsis secondary to HCAP and noted to have cavitary pulmonary lesions. She was treated with empiric vancomycin and cefepime in the hospital and per ID consultant, she was not a PICC candidate and was discharged with 1 month of Bactrim which she never filled patient came back to the hospital and found to be afebrile chest x-ray showed patchy bilateral air space opacities likely representing multifocal pneumonia #2 septic emboli or asymmetric edema also possibility.. CT of the cervical spine notable for opacities and nodules in the lung apices with cavitation Blood Cx 1/2 with MRSA now  Subjective  Continues to complaints of persistent cough and chest wall pain. Reports of slightly sob as well at rest but says it has been present for sometimes now.    Assessment/Plan:   1. Sepsis/MRSA bacteremia- blood cultures in 1/2 bottles  With MRSA from 2/3 -repeat cultures pending -ID consulting, continuing Vanc/stopped Cefepime, non complaint with recent Serratia endocarditis of tricuspid valve -2D ECHO - worsening of her vegetation.   2. Recent TV endocarditis with Severe TR, multifocal  pneumonia/septic embolization -treated with IV Vanc -seen by CVTS, not a surgical candidate and Palliative consult obtained last admission, medical rx pursued but didn't fill script for bactrim she was discharged on in January -Patient was not a PICC line candidate at recent discharge due to ongoing IV drug abuse -suspect progression of disease -ID following -worsening of her vegetation on Echo performed on 05/08/16  3. Multifocal pneumonia/Septic embolization from IE -with evolution to cavitary lung disease -now on Iv Vanc, ID following -FU sputum Cx -clinically do not suspect TB, will FU AFB smears  4. Acute kidney injury- patient baseline creatinine 0.6 in January 2018, admitted with creatinine of 2.91. - emboli to kidneys and Bactrim contributing to AKI are possibilities - creatinine down to 2.3, continue IVF - Caution with Vanc dosing per PharmD -no hydronephrosis on Renal US  5. Hypokalemia- -replace  6. Thrombocytopenia- platelet count 97, likely due to sepsis, Infection/Abx -was normal at last DC -Stop heparin and resume Eliquis she was on previously for h/o PE  7. History of pulmonary embolism -- patient was diagnosed with bilateral PEs in December 2017 and has been on eliquis, since creatinine improving will ask PharmD to transition back to gfr adjusted dose of Eliquis  8. Polysubstance abuse- patient has ongoing IV drug abuse. UDS positive for cocaine, opiates, benzodiazepines. Social work consultation requested.  DVT prophylaxis: Eliquis Code Status: Full code Family Communication: No family present at bedside  Disposition Plan: Pending medical stability Her prognosis remains poor at this time.   Consultants:  ID  Procedures:  None   Continuous infusions   Antibiotics:   Anti-infectives    Start     Dose/Rate  Route Frequency Ordered Stop   05/07/16 2000  vancomycin (VANCOCIN) 500 mg in sodium chloride 0.9 % 100 mL IVPB     500 mg 100 mL/hr over 60  Minutes Intravenous Every 24 hours 05/26/2016 1824     05/07/16 2000  ceFEPIme (MAXIPIME) 1 g in dextrose 5 % 50 mL IVPB  Status:  Discontinued     1 g 100 mL/hr over 30 Minutes Intravenous Every 24 hours 05/12/2016 1824 05/07/16 1546   05/27/2016 1800  ceFEPIme (MAXIPIME) 2 g in dextrose 5 % 50 mL IVPB     2 g 100 mL/hr over 30 Minutes Intravenous  Once 05/11/2016 1754 05/29/2016 1944   05/14/2016 1800  vancomycin (VANCOCIN) IVPB 1000 mg/200 mL premix     1,000 mg 200 mL/hr over 60 Minutes Intravenous  Once 05/24/2016 1754 05/20/2016 2030       Objective   Vitals:   05/08/16 1545 05/08/16 2138 05/09/16 0500 05/09/16 0501  BP: 121/81 114/71  114/71  Pulse: (!) 113 (!) 114  (!) 110  Resp: 20 20  20   Temp: 99.1 F (37.3 C) 99.1 F (37.3 C)  98.9 F (37.2 C)  TempSrc: Oral Oral  Oral  SpO2: 94% 93%  96%  Weight:   41.7 kg (91 lb 14.9 oz)   Height:        Intake/Output Summary (Last 24 hours) at 05/09/16 1057 Last data filed at 05/09/16 [id number]  Gross per 24 hour  Intake              340 ml  Output                0 ml  Net              340 ml   Filed Weights   05/15/2016 1412 05/09/16 0500  Weight: 43.1 kg (95 lb) 41.7 kg (91 lb 14.9 oz)     Physical Examination:  General exam: thinly built, cachectic Respiratory system: Bilateral rhonchi auscultated Cardiovascular system:  RRR, systolic murmur noted, tachycardic GI system: Abdomen is nondistended, positive tenderness in both right and left upper quadrant. No organomegaly.  Central nervous system. No focal neurological deficits. 5 x 5 power in all extremities. Skin: Excoriations noted in the extremities and trunk.Marland Kitchen. Psychiatry: flat affect    Data Reviewed: I have personally reviewed following labs and imaging studies  CBG:  Recent Labs Lab 05/07/16 0754 05/08/16 0758 05/09/16 0726  GLUCAP 216* 92 79    CBC:  Recent Labs Lab 05/15/2016 1554 05/04/2016 2023 05/07/16 0906 05/08/16 0625 05/09/16 0528  WBC 16.2*  --  12.7*  12.7* 15.3*  NEUTROABS 13.9*  --  10.0*  --   --   HGB 12.1  --  10.1* 11.7* 9.4*  HCT 34.9*  --  29.4* 35.3* 28.9*  MCV 78.8  --  79.9 81.7 83.5  PLT 81* 73* 73* 73* 97*    Basic Metabolic Panel:  Recent Labs Lab 05/18/2016 1554 05/07/16 0906 05/08/16 0625 05/09/16 0528  NA 127* 129* 130* 132*  K 3.4* 3.2* 4.1 4.2  CL 94* 101 103 105  CO2 17* 18* 18* 16*  GLUCOSE 119* 168* 96 97  BUN 64* 62* 53* 46*  CREATININE 2.91* 2.78* 2.38* 2.18*  CALCIUM 7.8* 7.3* 7.6* 7.4*  MG  --  2.2  --   --     Recent Results (from the past 240 hour(s))  Urine culture     Status: Abnormal   Collection Time:  05/14/2016  3:27 PM  Result Value Ref Range Status   Specimen Description URINE, RANDOM  Final   Special Requests NONE  Final   Culture (A)  Final    <10,000 COLONIES/mL INSIGNIFICANT GROWTH Performed at [facility], [address], [place], [place] [address]    Report Status 05/08/2016 FINAL  Final  Blood culture (routine x 2)     Status: Abnormal   Collection Time: 05/12/2016  5:35 PM  Result Value Ref Range Status   Specimen Description BLOOD LEFT ARM  Final   Special Requests IN PEDIATRIC BOTTLE 2CC  Final   Culture  Setup Time   Final    GRAM POSITIVE COCCI IN CLUSTERS IN PEDIATRIC BOTTLE CRITICAL RESULT CALLED TO, READ BACK BY AND VERIFIED WITH: T. GREEN PHARMD, AT [phone] 05/07/16 BY Renato Shin Performed at [facility], [address], [place], [place] [address]    Culture METHICILLIN RESISTANT STAPHYLOCOCCUS AUREUS (A)  Final   Report Status 05/09/2016 FINAL  Final   Organism ID, Bacteria METHICILLIN RESISTANT STAPHYLOCOCCUS AUREUS  Final      Susceptibility   Methicillin resistant staphylococcus aureus - MIC*    CIPROFLOXACIN <=0.5 SENSITIVE Sensitive     ERYTHROMYCIN >=8 RESISTANT Resistant     GENTAMICIN <=0.5 SENSITIVE Sensitive     OXACILLIN >=4 RESISTANT Resistant     TETRACYCLINE >=16 RESISTANT Resistant     VANCOMYCIN 1 SENSITIVE Sensitive     TRIMETH/SULFA  <=10 SENSITIVE Sensitive     CLINDAMYCIN >=8 RESISTANT Resistant     RIFAMPIN <=0.5 SENSITIVE Sensitive     Inducible Clindamycin NEGATIVE Sensitive     * METHICILLIN RESISTANT STAPHYLOCOCCUS AUREUS  Blood Culture ID Panel (Reflexed)     Status: Abnormal   Collection Time: 05/27/2016  5:35 PM  Result Value Ref Range Status   Enterococcus species NOT DETECTED NOT DETECTED Final   Listeria monocytogenes NOT DETECTED NOT DETECTED Final   Staphylococcus species DETECTED (A) NOT DETECTED Final    Comment: CRITICAL RESULT CALLED TO, READ BACK BY AND VERIFIED WITH: T. GREEN PHARMD, AT 5784 05/07/16 BY D. VANHOOK    Staphylococcus aureus DETECTED (A) NOT DETECTED Final    Comment: Methicillin (oxacillin)-resistant Staphylococcus aureus (MRSA). MRSA is predictably resistant to beta-lactam antibiotics (except ceftaroline). Preferred therapy is vancomycin unless clinically contraindicated. Patient requires contact precautions if  hospitalized. CRITICAL RESULT CALLED TO, READ BACK BY AND VERIFIED WITH: T. GREEN PHARMD, AT 6962 05/07/16 BY D. VANHOOK    Methicillin resistance DETECTED (A) NOT DETECTED Final    Comment: CRITICAL RESULT CALLED TO, READ BACK BY AND VERIFIED WITH: T. GREEN PHARMD, AT 9528 05/07/16 BY D. VANHOOK    Streptococcus species NOT DETECTED NOT DETECTED Final   Streptococcus agalactiae NOT DETECTED NOT DETECTED Final   Streptococcus pneumoniae NOT DETECTED NOT DETECTED Final   Streptococcus pyogenes NOT DETECTED NOT DETECTED Final   Acinetobacter baumannii NOT DETECTED NOT DETECTED Final   Enterobacteriaceae species NOT DETECTED NOT DETECTED Final   Enterobacter cloacae complex NOT DETECTED NOT DETECTED Final   Escherichia coli NOT DETECTED NOT DETECTED Final   Klebsiella oxytoca NOT DETECTED NOT DETECTED Final   Klebsiella pneumoniae NOT DETECTED NOT DETECTED Final   Proteus species NOT DETECTED NOT DETECTED Final   Serratia marcescens NOT DETECTED NOT DETECTED Final    Haemophilus influenzae NOT DETECTED NOT DETECTED Final   Neisseria meningitidis NOT DETECTED NOT DETECTED Final   Pseudomonas aeruginosa NOT DETECTED NOT DETECTED Final  Candida albicans NOT DETECTED NOT DETECTED Final   Candida glabrata NOT DETECTED NOT DETECTED Final   Candida krusei NOT DETECTED NOT DETECTED Final   Candida parapsilosis NOT DETECTED NOT DETECTED Final   Candida tropicalis NOT DETECTED NOT DETECTED Final    Comment: Performed at [facility], [address], [place], [place] [address]  MRSA PCR Screening     Status: None   Collection Time: 05/07/16 12:57 AM  Result Value Ref Range Status   MRSA by PCR NEGATIVE NEGATIVE Final    Comment:        The GeneXpert MRSA Assay (FDA approved for NASAL specimens only), is one component of a comprehensive MRSA colonization surveillance program. It is not intended to diagnose MRSA infection nor to guide or monitor treatment for MRSA infections.   Culture, sputum-assessment     Status: None   Collection Time: 05/07/16  1:22 PM  Result Value Ref Range Status   Specimen Description SPUTUM  Final   Special Requests Immunocompromised  Final   Sputum evaluation THIS SPECIMEN IS ACCEPTABLE FOR SPUTUM CULTURE  Final   Report Status 05/07/2016 FINAL  Final  Culture, respiratory (NON-Expectorated)     Status: None (Preliminary result)   Collection Time: 05/07/16  1:22 PM  Result Value Ref Range Status   Specimen Description SPUTUM  Final   Special Requests Immunocompromised Reflexed from [id number]  Final   Gram Stain   Final    ABUNDANT WBC PRESENT, PREDOMINANTLY PMN MODERATE YEAST MODERATE GRAM POSITIVE RODS    Culture   Final    MODERATE STAPHYLOCOCCUS AUREUS SUSCEPTIBILITIES TO FOLLOW Performed at [facility], [address], [place], [place] [address]    Report Status PENDING  Incomplete  Culture, blood (Routine X 2) w Reflex to ID Panel     Status: None (Preliminary result)   Collection Time: 05/08/16  6:25  AM  Result Value Ref Range Status   Specimen Description BLOOD LEFT ARM  Final   Special Requests IN PEDIATRIC BOTTLE 2CC  Final   Culture  Setup Time   Final    GRAM POSITIVE COCCI IN CLUSTERS AEROBIC BOTTLE ONLY CRITICAL VALUE NOTED.  VALUE IS CONSISTENT WITH PREVIOUSLY REPORTED AND CALLED VALUE.    Culture   Final    TOO YOUNG TO READ Performed at [facility], [address], [place], [place] [address]    Report Status PENDING  Incomplete  Culture, blood (Routine X 2) w Reflex to ID Panel     Status: None (Preliminary result)   Collection Time: 05/08/16  9:49 PM  Result Value Ref Range Status   Specimen Description BLOOD LEFT HAND  Final   Special Requests IN PEDIATRIC BOTTLE 0.5CC  Final   Culture  Setup Time   Final    GRAM POSITIVE COCCI IN CLUSTERS IN PEDIATRIC BOTTLE Organism ID to follow CRITICAL RESULT CALLED TO, READ BACK BY AND VERIFIED WITH: BETH GREEN,PHARMD @2339  002/05/18 MKELLY,MLT    Culture   Final    TOO YOUNG TO READ Performed at [facility], [address] [place], [place] [address]    Report Status PENDING  Incomplete     Liver Function Tests:  Recent Labs Lab 05/25/2016 2017 05/07/16 0906 05/08/16 0625  AST 12* 14* 14*  ALT 7* 8* 8*  ALKPHOS 103 91 95  BILITOT 1.5* 0.9 0.7  PROT 6.4* 6.2* 7.0  ALBUMIN 2.1* 2.0* 2.0*   No results for input(s): LIPASE,  AMYLASE in the last 168 hours. No results for input(s): AMMONIA in the last 168 hours.  Cardiac Enzymes:  Recent Labs Lab May 20, 2016 1554  CKTOTAL 16*   BNP (last 3 results)  Recent Labs  11/16/15 2030 04/03/16 0021  BNP 215.3* 89.9    ProBNP (last 3 results) No results for input(s): PROBNP in the last 8760 hours.    Studies: No results found.  Scheduled Meds: . apixaban  5 mg Oral BID  . feeding supplement (ENSURE ENLIVE)  237 mL Oral BID BM  . sodium chloride flush  3 mL Intravenous Q12H  . vancomycin  500 mg Intravenous Q24H      Time spent: 35  min  Ankit Chirag Amin   [facility] Pager [phone] If 7PM-7AM, please contact night-coverage at [email or web address], Office  [phone]  password [email or web address] 05/09/2016, 10:57 AM  LOS: 3 days

## 2016-05-09 NOTE — Progress Notes (Signed)
Pharmacy Antibiotic Note  Catherine Hernandez is a 31 y.o. female admitted on 05/23/2016 with sepsis, HCAP, tricuspid valve endocarditis, MRSA bacteremia.  PMH includes serratia bacteremia, IVDU, polysubstance abuse, hyponatremia, thrombocytopenia, AKI, and hx PE.  Pharmacy has been consulted for vancomycin dosing.  Today, 05/09/2016: Day #4 abx BCx still + on 2/5 Admitted with AKI, SCr elevated but improving  WBC elevated/increased Afebrile  Plan:  Vancomycin 500mg  IV q24h  Check trough at steady state, goal 15-20 mcg/ml  Follow up renal function & cultures, de-escalate as appropriate  Height: 5\' 5"  (165.1 cm) Weight: 91 lb 14.9 oz (41.7 kg) IBW/kg (Calculated) : 57  Temp (24hrs), Avg:99 F (37.2 C), Min:98.9 F (37.2 C), Max:99.1 F (37.3 C)   Recent Labs Lab 2016-05-22 1554 2016-05-22 1829 05/07/16 0906 05/08/16 0625 05/09/16 0528  WBC 16.2*  --  12.7* 12.7* 15.3*  CREATININE 2.91*  --  2.78* 2.38* 2.18*  LATICACIDVEN  --  1.66  --   --   --     Estimated Creatinine Clearance: 25.3 mL/min (by C-G formula based on SCr of 2.18 mg/dL (H)).    No Known Allergies  Antimicrobials this admission:  2/3 Vanc >> 2/3 Cefepime >>  Dose adjustments this admission:  2/6 Vancomycin trough level = _____ on Vanc 1g x1 then 500mg  q24h (prior to 4th dose)  Microbiology results:  2/3 BCx: 1/2 Staph aureus, MRSA + 2/3 UCx: insignificant growth  2/3 HIV Ab: NR 2/4 MRSA PCR: negative 2/4 Respiratory: reincubated 2/4 AFB smear/culture: pending 2/5 BCx: 2/2 GPC clusters  Thank you for allowing pharmacy to be a part of this patient's care.  Lynann Beaverhristine Khup Sapia PharmD, BCPS Pager [phone] 05/09/2016 7:56 AM

## 2016-05-09 NOTE — Progress Notes (Signed)
Assumed care of patient at this time.  Agree with previously charted assessment from this shift. Will continue to monitor and assess.  Huzaifa Viney Ann, RN 05/09/2016   

## 2016-05-10 LAB — CBC
HCT: 27.3 % — ABNORMAL LOW (ref 36.0–46.0)
Hemoglobin: 8.7 g/dL — ABNORMAL LOW (ref 12.0–15.0)
MCH: 26.1 pg (ref 26.0–34.0)
MCHC: 31.9 g/dL (ref 30.0–36.0)
MCV: 82 fL (ref 78.0–100.0)
Platelets: 107 10*3/uL — ABNORMAL LOW (ref 150–400)
RBC: 3.33 MIL/uL — ABNORMAL LOW (ref 3.87–5.11)
RDW: 20 % — ABNORMAL HIGH (ref 11.5–15.5)
WBC: 19.6 10*3/uL — ABNORMAL HIGH (ref 4.0–10.5)

## 2016-05-10 LAB — ACID FAST SMEAR (AFB, MYCOBACTERIA): Acid Fast Smear: NEGATIVE

## 2016-05-10 LAB — CULTURE, RESPIRATORY W GRAM STAIN

## 2016-05-10 LAB — GLUCOSE, CAPILLARY: Glucose-Capillary: 99 mg/dL (ref 65–99)

## 2016-05-10 MED ORDER — LORAZEPAM 2 MG/ML IJ SOLN
0.5000 mg | Freq: Once | INTRAMUSCULAR | Status: AC
  Administered 2016-05-10: 0.5 mg via INTRAVENOUS
  Filled 2016-05-10: qty 1

## 2016-05-10 MED ORDER — SODIUM CHLORIDE 0.9 % IV BOLUS (SEPSIS)
500.0000 mL | Freq: Once | INTRAVENOUS | Status: AC
  Administered 2016-05-10: 500 mL via INTRAVENOUS

## 2016-05-10 MED ORDER — METOPROLOL TARTRATE 5 MG/5ML IV SOLN
5.0000 mg | Freq: Once | INTRAVENOUS | Status: AC | PRN
  Administered 2016-05-10: 5 mg via INTRAVENOUS
  Filled 2016-05-10: qty 5

## 2016-05-10 MED ORDER — LORAZEPAM 2 MG/ML IJ SOLN
1.0000 mg | Freq: Once | INTRAMUSCULAR | Status: AC
  Administered 2016-05-10: 1 mg via INTRAVENOUS
  Filled 2016-05-10: qty 1

## 2016-05-10 MED ORDER — CHLORHEXIDINE GLUCONATE 0.12 % MT SOLN
15.0000 mL | Freq: Two times a day (BID) | OROMUCOSAL | Status: DC
  Administered 2016-05-10 – 2016-05-16 (×12): 15 mL via OROMUCOSAL
  Filled 2016-05-10 (×13): qty 15

## 2016-05-10 MED ORDER — ORAL CARE MOUTH RINSE
15.0000 mL | Freq: Two times a day (BID) | OROMUCOSAL | Status: DC
  Administered 2016-05-10 – 2016-05-11 (×3): 15 mL via OROMUCOSAL

## 2016-05-10 MED ORDER — BENZONATATE 100 MG PO CAPS
100.0000 mg | ORAL_CAPSULE | Freq: Three times a day (TID) | ORAL | Status: DC | PRN
Start: 2016-05-10 — End: 2016-05-11
  Administered 2016-05-10 – 2016-05-11 (×2): 100 mg via ORAL
  Filled 2016-05-10 (×2): qty 1

## 2016-05-10 MED ORDER — ACETAMINOPHEN 325 MG PO TABS
325.0000 mg | ORAL_TABLET | Freq: Once | ORAL | Status: AC
  Administered 2016-05-10: 325 mg via ORAL
  Filled 2016-05-10: qty 1

## 2016-05-10 NOTE — Progress Notes (Signed)
Pt HR sustaining 140-150s after aggressive coughing episode.  Dr Nelson ChimesAmin paged and made aware.  New orders received.  Will administer bolus and IV ativan.  Will report to oncoming shift RN to monitor HR and give 5mg  IV lopressor after bolus/ativan if HR remains >120 as per Dr Nelson ChimesAmin orders.

## 2016-05-10 NOTE — Progress Notes (Addendum)
[facility]  PROGRESS NOTE  Catherine Hernandez ZOX:[id number] DOB: 02-Jul-1985 DOA: May 13, 2016 PCP: No PCP Per Patient   Brief HPI:    31 y.o. female with medical history significant for IV drug abuse, endocarditis involving tricuspid valve, and nonadherence with her treatment plan, now presenting to the emergency department with generalized aches and pains, malaise, productive cough, and fatigue.   She has had a complicated recent medical course involving hospitalization in December 2017 with Serratia bacteremia, bilateral pulmonary emboli, and tricuspid valve endocarditis. She was evaluated by CT surgery at that time and deemed to not be a surgical candidate.She was treated with antibiotics in the hospital and discharged to complete a course of Cipro. She returned to the hospital with the starting of January 2018 with sepsis secondary to HCAP and noted to have cavitary pulmonary lesions. She was treated with empiric vancomycin and cefepime in the hospital and per ID consultant, she was not a PICC candidate and was discharged with 1 month of Bactrim which she never filled patient came back to the hospital and found to be afebrile chest x-ray showed patchy bilateral air space opacities likely representing multifocal pneumonia #2 septic emboli or asymmetric edema also possibility.. CT of the cervical spine notable for opacities and nodules in the lung apices with cavitation Blood Cx 1/2 with MRSA now  Subjective  Still complains of chest pain, cough and sob with movement. She is very    Assessment/Plan:   1. Sepsis/MRSA bacteremia- blood cultures in 1/2 bottles  With MRSA from 2/3 -repeat cultures pending -ID consulting, continuing Vanc/stopped Cefepime, non complaint with recent Serratia endocarditis of tricuspid valve -2D ECHO - worsening of her vegetation.  -cont vanc, repeating cultures today. Palliative care team reconsulted.   2. Recent TV endocarditis with Severe TR, multifocal  pneumonia/septic embolization -treated with IV Vanc -seen by CVTS, not a surgical candidate and Palliative consult obtained last admission, medical rx pursued but didn't fill script for bactrim she was discharged on in January -Patient was not a PICC line candidate at recent discharge due to ongoing IV drug abuse -suspect progression of disease -ID following -worsening of her vegetation on Echo performed on 05/08/16  3. Multifocal pneumonia/Septic embolization from IE -with evolution to cavitary lung disease -now on Iv Vanc, ID following -FU sputum Cx -clinically do not suspect TB, will FU AFB smears  4. Acute kidney injury- patient baseline creatinine 0.6 in January 2018, admitted with creatinine of 2.91. - emboli to kidneys and Bactrim contributing to AKI are possibilities - creatinine down to 2.3, continue IVF - Caution with Vanc dosing per PharmD -no hydronephrosis on Renal US  5. Hypokalemia- -replace  6. Thrombocytopenia- platelet count 97, likely due to sepsis, Infection/Abx -was normal at last DC -Stop heparin and resume Eliquis she was on previously for h/o PE  7. History of pulmonary embolism -- patient was diagnosed with bilateral PEs in December 2017 and has been on eliquis, since creatinine improving will ask PharmD to transition back to gfr adjusted dose of Eliquis  8. Polysubstance abuse- patient has ongoing IV drug abuse. UDS positive for cocaine, opiates, benzodiazepines. Social work consultation requested.  9. Anemia- unknown source and etiology -will check occult stool  DVT prophylaxis: Eliquis Code Status: Full code Family Communication: No family present at bedside  Disposition Plan: Pending medical stability Her prognosis remains poor at this time.   Consultants:  ID  Procedures:  None   Continuous infusions   Antibiotics:   Anti-infectives  Start     Dose/Rate Route Frequency Ordered Stop   05/09/16 2200  vancomycin (VANCOCIN) IVPB 750  mg/150 ml premix     750 mg 150 mL/hr over 60 Minutes Intravenous Every 24 hours 05/09/16 2143     05/07/16 2000  vancomycin (VANCOCIN) 500 mg in sodium chloride 0.9 % 100 mL IVPB  Status:  Discontinued     500 mg 100 mL/hr over 60 Minutes Intravenous Every 24 hours 05/25/2016 1824 05/09/16 2143   05/07/16 2000  ceFEPIme (MAXIPIME) 1 g in dextrose 5 % 50 mL IVPB  Status:  Discontinued     1 g 100 mL/hr over 30 Minutes Intravenous Every 24 hours 05/25/2016 1824 05/07/16 1546   05/20/2016 1800  ceFEPIme (MAXIPIME) 2 g in dextrose 5 % 50 mL IVPB     2 g 100 mL/hr over 30 Minutes Intravenous  Once 05/26/2016 1754 05/18/2016 1944   05/24/2016 1800  vancomycin (VANCOCIN) IVPB 1000 mg/200 mL premix     1,000 mg 200 mL/hr over 60 Minutes Intravenous  Once 05/08/2016 1754 05/18/2016 2030       Objective   Vitals:   05/09/16 1434 05/09/16 2205 05/10/16 0555 05/10/16 1300  BP: 111/83 121/72 114/72 119/78  Pulse: (!) 123 (!) 130 (!) 116 (!) 113  Resp: 19 20 20 19   Temp: 98.4 F (36.9 C) 99.5 F (37.5 C) 98.5 F (36.9 C) 98 F (36.7 C)  TempSrc: Oral Axillary Oral Oral  SpO2: 99% 95% 97% 98%  Weight:   42.3 kg (93 lb 4.1 oz)   Height:        Intake/Output Summary (Last 24 hours) at 05/10/16 1340 Last data filed at 05/10/16 1300  Gross per 24 hour  Intake              630 ml  Output              900 ml  Net             -270 ml   Filed Weights   05/19/2016 1412 05/09/16 0500 05/10/16 0555  Weight: 43.1 kg (95 lb) 41.7 kg (91 lb 14.9 oz) 42.3 kg (93 lb 4.1 oz)     Physical Examination:  General exam: thinly built, cachectic Respiratory system: Bilateral rhonchi auscultated Cardiovascular system:  RRR, systolic murmur noted, tachycardic GI system: Abdomen is nondistended, positive tenderness in both right and left upper quadrant. No organomegaly.  Central nervous system. No focal neurological deficits. 5 x 5 power in all extremities. Skin: Excoriations noted in the extremities and  trunk.Marland Kitchen Psychiatry: flat affect    Data Reviewed: I have personally reviewed following labs and imaging studies  CBG:  Recent Labs Lab 05/07/16 0754 05/08/16 0758 05/09/16 0726 05/10/16 0731  GLUCAP 216* 92 79 99    CBC:  Recent Labs Lab 05/11/2016 1554 05/09/2016 2023 05/07/16 0906 05/08/16 0625 05/09/16 0528 05/10/16 0559  WBC 16.2*  --  12.7* 12.7* 15.3* 19.6*  NEUTROABS 13.9*  --  10.0*  --   --   --   HGB 12.1  --  10.1* 11.7* 9.4* 8.7*  HCT 34.9*  --  29.4* 35.3* 28.9* 27.3*  MCV 78.8  --  79.9 81.7 83.5 82.0  PLT 81* 73* 73* 73* 97* 107*    Basic Metabolic Panel:  Recent Labs Lab 05/14/2016 1554 05/07/16 0906 05/08/16 0625 05/09/16 0528  NA 127* 129* 130* 132*  K 3.4* 3.2* 4.1 4.2  CL 94* 101 103 105  CO2  17* 18* 18* 16*  GLUCOSE 119* 168* 96 97  BUN 64* 62* 53* 46*  CREATININE 2.91* 2.78* 2.38* 2.18*  CALCIUM 7.8* 7.3* 7.6* 7.4*  MG  --  2.2  --   --     Recent Results (from the past 240 hour(s))  Urine culture     Status: Abnormal   Collection Time: 05/10/2016  3:27 PM  Result Value Ref Range Status   Specimen Description URINE, RANDOM  Final   Special Requests NONE  Final   Culture (A)  Final    <10,000 COLONIES/mL INSIGNIFICANT GROWTH Performed at [facility], [address] [place], [place] [address]    Report Status 05/08/2016 FINAL  Final  Blood culture (routine x 2)     Status: Abnormal   Collection Time: 05/08/2016  5:35 PM  Result Value Ref Range Status   Specimen Description BLOOD LEFT ARM  Final   Special Requests IN PEDIATRIC BOTTLE 2CC  Final   Culture  Setup Time   Final    GRAM POSITIVE COCCI IN CLUSTERS IN PEDIATRIC BOTTLE CRITICAL RESULT CALLED TO, READ BACK BY AND VERIFIED WITH: T. GREEN PHARMD, AT 6045 05/07/16 BY Renato Shin Performed at [facility], [address] [place], [place] [address]    Culture METHICILLIN RESISTANT STAPHYLOCOCCUS AUREUS (A)  Final   Report Status 05/09/2016 FINAL  Final   Organism  ID, Bacteria METHICILLIN RESISTANT STAPHYLOCOCCUS AUREUS  Final      Susceptibility   Methicillin resistant staphylococcus aureus - MIC*    CIPROFLOXACIN <=0.5 SENSITIVE Sensitive     ERYTHROMYCIN >=8 RESISTANT Resistant     GENTAMICIN <=0.5 SENSITIVE Sensitive     OXACILLIN >=4 RESISTANT Resistant     TETRACYCLINE >=16 RESISTANT Resistant     VANCOMYCIN 1 SENSITIVE Sensitive     TRIMETH/SULFA <=10 SENSITIVE Sensitive     CLINDAMYCIN >=8 RESISTANT Resistant     RIFAMPIN <=0.5 SENSITIVE Sensitive     Inducible Clindamycin NEGATIVE Sensitive     * METHICILLIN RESISTANT STAPHYLOCOCCUS AUREUS  Blood Culture ID Panel (Reflexed)     Status: Abnormal   Collection Time: 05/18/2016  5:35 PM  Result Value Ref Range Status   Enterococcus species NOT DETECTED NOT DETECTED Final   Listeria monocytogenes NOT DETECTED NOT DETECTED Final   Staphylococcus species DETECTED (A) NOT DETECTED Final    Comment: CRITICAL RESULT CALLED TO, READ BACK BY AND VERIFIED WITH: T. GREEN PHARMD, AT 1914 05/07/16 BY D. VANHOOK    Staphylococcus aureus DETECTED (A) NOT DETECTED Final    Comment: Methicillin (oxacillin)-resistant Staphylococcus aureus (MRSA). MRSA is predictably resistant to beta-lactam antibiotics (except ceftaroline). Preferred therapy is vancomycin unless clinically contraindicated. Patient requires contact precautions if  hospitalized. CRITICAL RESULT CALLED TO, READ BACK BY AND VERIFIED WITH: T. GREEN PHARMD, AT 7829 05/07/16 BY D. VANHOOK    Methicillin resistance DETECTED (A) NOT DETECTED Final    Comment: CRITICAL RESULT CALLED TO, READ BACK BY AND VERIFIED WITH: T. GREEN PHARMD, AT 5621 05/07/16 BY D. VANHOOK    Streptococcus species NOT DETECTED NOT DETECTED Final   Streptococcus agalactiae NOT DETECTED NOT DETECTED Final   Streptococcus pneumoniae NOT DETECTED NOT DETECTED Final   Streptococcus pyogenes NOT DETECTED NOT DETECTED Final   Acinetobacter baumannii NOT DETECTED NOT DETECTED Final    Enterobacteriaceae species NOT DETECTED NOT DETECTED Final   Enterobacter cloacae complex NOT DETECTED NOT DETECTED Final   Escherichia coli NOT DETECTED NOT DETECTED Final  Klebsiella oxytoca NOT DETECTED NOT DETECTED Final   Klebsiella pneumoniae NOT DETECTED NOT DETECTED Final   Proteus species NOT DETECTED NOT DETECTED Final   Serratia marcescens NOT DETECTED NOT DETECTED Final   Haemophilus influenzae NOT DETECTED NOT DETECTED Final   Neisseria meningitidis NOT DETECTED NOT DETECTED Final   Pseudomonas aeruginosa NOT DETECTED NOT DETECTED Final   Candida albicans NOT DETECTED NOT DETECTED Final   Candida glabrata NOT DETECTED NOT DETECTED Final   Candida krusei NOT DETECTED NOT DETECTED Final   Candida parapsilosis NOT DETECTED NOT DETECTED Final   Candida tropicalis NOT DETECTED NOT DETECTED Final    Comment: Performed at [facility], [address] [place], [place] [address]  Blood culture (routine x 2)     Status: None (Preliminary result)   Collection Time: 05/27/2016  6:18 PM  Result Value Ref Range Status   Specimen Description BLOOD RIGHT NECK  Final   Special Requests IN PEDIATRIC BOTTLE 2CC  Final   Culture   Final    NO GROWTH 3 DAYS Performed at [facility], [address], [place], [place] [address]    Report Status PENDING  Incomplete  MRSA PCR Screening     Status: None   Collection Time: 05/07/16 12:57 AM  Result Value Ref Range Status   MRSA by PCR NEGATIVE NEGATIVE Final    Comment:        The GeneXpert MRSA Assay (FDA approved for NASAL specimens only), is one component of a comprehensive MRSA colonization surveillance program. It is not intended to diagnose MRSA infection nor to guide or monitor treatment for MRSA infections.   Culture, sputum-assessment     Status: None   Collection Time: 05/07/16  1:22 PM  Result Value Ref Range Status   Specimen Description SPUTUM  Final   Special Requests Immunocompromised  Final   Sputum  evaluation THIS SPECIMEN IS ACCEPTABLE FOR SPUTUM CULTURE  Final   Report Status 05/07/2016 FINAL  Final  Culture, respiratory (NON-Expectorated)     Status: None   Collection Time: 05/07/16  1:22 PM  Result Value Ref Range Status   Specimen Description SPUTUM  Final   Special Requests Immunocompromised Reflexed from [id number]  Final   Gram Stain   Final    ABUNDANT WBC PRESENT, PREDOMINANTLY PMN MODERATE YEAST MODERATE GRAM POSITIVE RODS Performed at [facility], [address] [place], [place] [address]    Culture MODERATE STAPHYLOCOCCUS AUREUS  Final   Report Status 05/10/2016 FINAL  Final   Organism ID, Bacteria STAPHYLOCOCCUS AUREUS  Final      Susceptibility   Staphylococcus aureus - MIC*    CIPROFLOXACIN <=0.5 SENSITIVE Sensitive     ERYTHROMYCIN >=8 RESISTANT Resistant     GENTAMICIN <=0.5 SENSITIVE Sensitive     OXACILLIN 0.5 SENSITIVE Sensitive     TETRACYCLINE >=16 RESISTANT Resistant     VANCOMYCIN 1 SENSITIVE Sensitive     TRIMETH/SULFA <=10 SENSITIVE Sensitive     CLINDAMYCIN >=8 RESISTANT Resistant     RIFAMPIN <=0.5 SENSITIVE Sensitive     Inducible Clindamycin NEGATIVE Sensitive     * MODERATE STAPHYLOCOCCUS AUREUS  Culture, blood (Routine X 2) w Reflex to ID Panel     Status: Abnormal (Preliminary result)   Collection Time: 05/08/16  6:25 AM  Result Value Ref Range Status   Specimen Description BLOOD LEFT ARM  Final   Special Requests IN PEDIATRIC BOTTLE [facility]  Final   Culture  Setup  Time   Final    GRAM POSITIVE COCCI IN CLUSTERS AEROBIC BOTTLE ONLY CRITICAL VALUE NOTED.  VALUE IS CONSISTENT WITH PREVIOUSLY REPORTED AND CALLED VALUE. Performed at [facility], [address] [place], [place] [address]    Culture STAPHYLOCOCCUS AUREUS (A)  Final   Report Status PENDING  Incomplete  Culture, blood (routine x 2)     Status: Abnormal (Preliminary result)   Collection Time: 05/08/16  5:59 PM  Result Value Ref Range Status   Specimen Description  BLOOD LEFT HAND  Final   Special Requests IN PEDIATRIC BOTTLE 1 CC  Final   Culture  Setup Time   Final    GRAM POSITIVE COCCI IN CLUSTERS IN PEDIATRIC BOTTLE CRITICAL RESULT CALLED TO, READ BACK BY AND VERIFIED WITH: Thana Ates [id number] MLM Performed at [facility], [address] [place], [place] [address]    Culture STAPHYLOCOCCUS AUREUS (A)  Final   Report Status PENDING  Incomplete  Culture, blood (routine x 2)     Status: Abnormal (Preliminary result)   Collection Time: 05/08/16  5:59 PM  Result Value Ref Range Status   Specimen Description BLOOD LEFT ARM  Final   Special Requests IN PEDIATRIC BOTTLE 1.5CC  Final   Culture  Setup Time   Final    GRAM POSITIVE COCCI IN CLUSTERS AEROBIC BOTTLE ONLY CRITICAL VALUE NOTED.  VALUE IS CONSISTENT WITH PREVIOUSLY REPORTED AND CALLED VALUE. Performed at [facility], [address] [place], [place] [address]    Culture STAPHYLOCOCCUS AUREUS (A)  Final   Report Status PENDING  Incomplete  Culture, blood (Routine X 2) w Reflex to ID Panel     Status: Abnormal (Preliminary result)   Collection Time: 05/08/16  9:49 PM  Result Value Ref Range Status   Specimen Description BLOOD LEFT HAND  Final   Special Requests IN PEDIATRIC BOTTLE 0.5CC  Final   Culture  Setup Time   Final    GRAM POSITIVE COCCI IN CLUSTERS IN PEDIATRIC BOTTLE CRITICAL RESULT CALLED TO, READ BACK BY AND VERIFIED WITH: BETH GREEN,PHARMD @2339  002/05/18 MKELLY,MLT    Culture (A)  Final    STAPHYLOCOCCUS AUREUS SUSCEPTIBILITIES TO FOLLOW Performed at [facility], [address], [place], [place] [address]    Report Status PENDING  Incomplete     Liver Function Tests:  Recent Labs Lab 2016/05/09 2017 05/07/16 0906 05/08/16 0625  AST 12* 14* 14*  ALT 7* 8* 8*  ALKPHOS 103 91 95  BILITOT 1.5* 0.9 0.7  PROT 6.4* 6.2* 7.0  ALBUMIN 2.1* 2.0* 2.0*   No results for input(s): LIPASE, AMYLASE in the last 168 hours. No results for  input(s): AMMONIA in the last 168 hours.  Cardiac Enzymes:  Recent Labs Lab 05-09-2016 1554  CKTOTAL 16*   BNP (last 3 results)  Recent Labs  11/16/15 2030 04/03/16 0021  BNP 215.3* 89.9    ProBNP (last 3 results) No results for input(s): PROBNP in the last 8760 hours.    Studies: No results found.  Scheduled Meds: . apixaban  5 mg Oral BID  . chlorhexidine  15 mL Mouth Rinse BID  . feeding supplement (ENSURE ENLIVE)  237 mL Oral BID BM  . mouth rinse  15 mL Mouth Rinse q12n4p  . sodium chloride flush  3 mL Intravenous Q12H  . vancomycin  750 mg Intravenous Q24H      Time spent: 35 min  Emmerich Cryer Chirag Suellen Durocher  [facility] Pager [phone] If 7PM-7AM, please contact night-coverage at [email or web address], Office  [phone]  password [email or web address] 05/10/2016, 1:40 PM  LOS: 4 days

## 2016-05-10 NOTE — Progress Notes (Signed)
Patient was very anxious. Heart rate sustaining between  130s to 140s.  NP on call made aware and order for ativan given. Heart rate trending down to 120s  and patient resting quietly at this time.

## 2016-05-10 NOTE — Progress Notes (Signed)
Patient ID: Catherine DittyAngela N Hernandez, female   DOB: 1986/02/04, 31 y.o.   MRN: [id number]         [facility]  Date of Admission:  05/24/2016           Day 5 vancomycin  Principal Problem:   Endocarditis of tricuspid valve Active Problems:   HCAP (healthcare-associated pneumonia)   MRSA bacteremia   Enterococcal bacteremia   Staphylococcus aureus bacteremia   Serratia infection   Polysubstance abuse   IVDU (intravenous drug user)   Opioid use disorder, severe, dependence (HCC)   Hyponatremia   Thrombocytopenia (HCC)   Cavitary lung disease   Poor dentition   Acute kidney injury (HCC)   Protein-calorie malnutrition, severe (HCC)   Symptomatic anemia   Septic embolism (HCC)   Tobacco abuse   History of pulmonary embolism   . apixaban  5 mg Oral BID  . feeding supplement (ENSURE ENLIVE)  237 mL Oral BID BM  . sodium chloride flush  3 mL Intravenous Q12H  . vancomycin  750 mg Intravenous Q24H    SUBJECTIVE: Catherine Hernandez continues to complain of chest and back pain. She continues to have some cough productive of blood-streaked sputum.  Review of Systems: Review of Systems  Constitutional: Positive for malaise/fatigue and weight loss. Negative for chills, diaphoresis and fever.  HENT: Negative for sore throat.   Respiratory: Positive for cough, hemoptysis, sputum production and shortness of breath.   Cardiovascular: Positive for chest pain.  Gastrointestinal: Negative for abdominal pain, diarrhea, heartburn, nausea and vomiting.  Genitourinary: Negative for dysuria and frequency.  Musculoskeletal: Negative for joint pain and myalgias.  Skin: Negative for rash.  Neurological: Negative for dizziness and headaches.  Psychiatric/Behavioral: Positive for depression and substance abuse. The patient is not nervous/anxious.     Past Medical History:  Diagnosis Date  . Anemia   . CAP (community acquired pneumonia) 11/16/2015  . Childhood asthma   . Endocarditis    . Enterococcal bacteremia   . Heroin abuse   . Hyponatremia   . Pneumonia 2007  . Polysubstance abuse     Social History  Substance Use Topics  . Smoking status: Current Every Day Smoker    Packs/day: 0.50    Years: 10.00    Types: Cigarettes  . Smokeless tobacco: Never Used  . Alcohol use No    Family History  Problem Relation Age of Onset  . Anesthesia problems Neg Hx   . Hypotension Neg Hx   . Malignant hyperthermia Neg Hx   . Pseudochol deficiency Neg Hx    No Known Allergies  OBJECTIVE: Vitals:   05/09/16 0501 05/09/16 1434 05/09/16 2205 05/10/16 0555  BP: 114/71 111/83 121/72 114/72  Pulse: (!) 110 (!) 123 (!) 130 (!) 116  Resp: 20 19 20 20   Temp: 98.9 F (37.2 C) 98.4 F (36.9 C) 99.5 F (37.5 C) 98.5 F (36.9 C)  TempSrc: Oral Oral Axillary Oral  SpO2: 96% 99% 95% 97%  Weight:    93 lb 4.1 oz (42.3 kg)  Height:       Body mass index is 15.52 kg/m.  Physical Exam  Constitutional: She is oriented to person, place, and time.  She was sleeping quietly in bed when I entered the room. As I began to talk to her about her condition she became very anxious and her heart rate jumped up to 130.  HENT:  Mouth/Throat: No oropharyngeal exudate.  Poor dentition.  Eyes: Conjunctivae are normal.  Cardiovascular:  Normal rate and regular rhythm.   No murmur heard. Pulmonary/Chest: Effort normal. She has wheezes. She has rales.  Abdominal: Soft. There is no tenderness.  Musculoskeletal: Normal range of motion. She exhibits no edema or tenderness.  Neurological: She is alert and oriented to person, place, and time.  Skin:  Diffuse, excoriated lesions and scars. Multiple tattoos.    Lab Results Lab Results  Component Value Date   WBC 19.6 (H) 05/10/2016   HGB 8.7 (L) 05/10/2016   HCT 27.3 (L) 05/10/2016   MCV 82.0 05/10/2016   PLT 107 (L) 05/10/2016    Lab Results  Component Value Date   CREATININE 2.18 (H) 05/09/2016   BUN 46 (H) 05/09/2016   NA 132 (L)  05/09/2016   K 4.2 05/09/2016   CL 105 05/09/2016   CO2 16 (L) 05/09/2016    Lab Results  Component Value Date   ALT 8 (L) 05/08/2016   AST 14 (L) 05/08/2016   ALKPHOS 95 05/08/2016   BILITOT 0.7 05/08/2016     Microbiology: Recent Results (from the past 240 hour(s))  Urine culture     Status: Abnormal   Collection Time: 05/14/2016  3:27 PM  Result Value Ref Range Status   Specimen Description URINE, RANDOM  Final   Special Requests NONE  Final   Culture (A)  Final    <10,000 COLONIES/mL INSIGNIFICANT GROWTH Performed at [facility], [address] [place], [place] [address]    Report Status 05/08/2016 FINAL  Final  Blood culture (routine x 2)     Status: Abnormal   Collection Time: 05/04/2016  5:35 PM  Result Value Ref Range Status   Specimen Description BLOOD LEFT ARM  Final   Special Requests IN PEDIATRIC BOTTLE 2CC  Final   Culture  Setup Time   Final    GRAM POSITIVE COCCI IN CLUSTERS IN PEDIATRIC BOTTLE CRITICAL RESULT CALLED TO, READ BACK BY AND VERIFIED WITH: T. GREEN PHARMD, AT 6045 05/07/16 BY Renato Shin Performed at [facility], [address], [place], [place] [address]    Culture METHICILLIN RESISTANT STAPHYLOCOCCUS AUREUS (A)  Final   Report Status 05/09/2016 FINAL  Final   Organism ID, Bacteria METHICILLIN RESISTANT STAPHYLOCOCCUS AUREUS  Final      Susceptibility   Methicillin resistant staphylococcus aureus - MIC*    CIPROFLOXACIN <=0.5 SENSITIVE Sensitive     ERYTHROMYCIN >=8 RESISTANT Resistant     GENTAMICIN <=0.5 SENSITIVE Sensitive     OXACILLIN >=4 RESISTANT Resistant     TETRACYCLINE >=16 RESISTANT Resistant     VANCOMYCIN 1 SENSITIVE Sensitive     TRIMETH/SULFA <=10 SENSITIVE Sensitive     CLINDAMYCIN >=8 RESISTANT Resistant     RIFAMPIN <=0.5 SENSITIVE Sensitive     Inducible Clindamycin NEGATIVE Sensitive     * METHICILLIN RESISTANT STAPHYLOCOCCUS AUREUS  Blood Culture ID Panel (Reflexed)     Status: Abnormal   Collection Time:  05/09/2016  5:35 PM  Result Value Ref Range Status   Enterococcus species NOT DETECTED NOT DETECTED Final   Listeria monocytogenes NOT DETECTED NOT DETECTED Final   Staphylococcus species DETECTED (A) NOT DETECTED Final    Comment: CRITICAL RESULT CALLED TO, READ BACK BY AND VERIFIED WITH: T. GREEN PHARMD, AT 1914 05/07/16 BY D. VANHOOK    Staphylococcus aureus DETECTED (A) NOT DETECTED Final    Comment: Methicillin (oxacillin)-resistant Staphylococcus aureus (MRSA). MRSA is predictably resistant to beta-lactam antibiotics (except ceftaroline). Preferred therapy is vancomycin unless clinically  contraindicated. Patient requires contact precautions if  hospitalized. CRITICAL RESULT CALLED TO, READ BACK BY AND VERIFIED WITH: T. GREEN PHARMD, AT 9604 05/07/16 BY D. VANHOOK    Methicillin resistance DETECTED (A) NOT DETECTED Final    Comment: CRITICAL RESULT CALLED TO, READ BACK BY AND VERIFIED WITH: T. GREEN PHARMD, AT 5409 05/07/16 BY D. VANHOOK    Streptococcus species NOT DETECTED NOT DETECTED Final   Streptococcus agalactiae NOT DETECTED NOT DETECTED Final   Streptococcus pneumoniae NOT DETECTED NOT DETECTED Final   Streptococcus pyogenes NOT DETECTED NOT DETECTED Final   Acinetobacter baumannii NOT DETECTED NOT DETECTED Final   Enterobacteriaceae species NOT DETECTED NOT DETECTED Final   Enterobacter cloacae complex NOT DETECTED NOT DETECTED Final   Escherichia coli NOT DETECTED NOT DETECTED Final   Klebsiella oxytoca NOT DETECTED NOT DETECTED Final   Klebsiella pneumoniae NOT DETECTED NOT DETECTED Final   Proteus species NOT DETECTED NOT DETECTED Final   Serratia marcescens NOT DETECTED NOT DETECTED Final   Haemophilus influenzae NOT DETECTED NOT DETECTED Final   Neisseria meningitidis NOT DETECTED NOT DETECTED Final   Pseudomonas aeruginosa NOT DETECTED NOT DETECTED Final   Candida albicans NOT DETECTED NOT DETECTED Final   Candida glabrata NOT DETECTED NOT DETECTED Final   Candida  krusei NOT DETECTED NOT DETECTED Final   Candida parapsilosis NOT DETECTED NOT DETECTED Final   Candida tropicalis NOT DETECTED NOT DETECTED Final    Comment: Performed at [facility], [address], [place], [place] [address]  Blood culture (routine x 2)     Status: None (Preliminary result)   Collection Time: 05/31/2016  6:18 PM  Result Value Ref Range Status   Specimen Description BLOOD RIGHT NECK  Final   Special Requests IN PEDIATRIC BOTTLE 2CC  Final   Culture   Final    NO GROWTH 3 DAYS Performed at [facility], [address] [place], [place] [address]    Report Status PENDING  Incomplete  MRSA PCR Screening     Status: None   Collection Time: 05/07/16 12:57 AM  Result Value Ref Range Status   MRSA by PCR NEGATIVE NEGATIVE Final    Comment:        The GeneXpert MRSA Assay (FDA approved for NASAL specimens only), is one component of a comprehensive MRSA colonization surveillance program. It is not intended to diagnose MRSA infection nor to guide or monitor treatment for MRSA infections.   Culture, sputum-assessment     Status: None   Collection Time: 05/07/16  1:22 PM  Result Value Ref Range Status   Specimen Description SPUTUM  Final   Special Requests Immunocompromised  Final   Sputum evaluation THIS SPECIMEN IS ACCEPTABLE FOR SPUTUM CULTURE  Final   Report Status 05/07/2016 FINAL  Final  Culture, respiratory (NON-Expectorated)     Status: None   Collection Time: 05/07/16  1:22 PM  Result Value Ref Range Status   Specimen Description SPUTUM  Final   Special Requests Immunocompromised Reflexed from [id number]  Final   Gram Stain   Final    ABUNDANT WBC PRESENT, PREDOMINANTLY PMN MODERATE YEAST MODERATE GRAM POSITIVE RODS Performed at [facility], [address], [place], [place] [address]    Culture MODERATE STAPHYLOCOCCUS AUREUS  Final   Report Status 05/10/2016 FINAL  Final   Organism ID, Bacteria STAPHYLOCOCCUS AUREUS  Final       Susceptibility   Staphylococcus aureus - MIC*    CIPROFLOXACIN <=0.5 SENSITIVE Sensitive  ERYTHROMYCIN >=8 RESISTANT Resistant     GENTAMICIN <=0.5 SENSITIVE Sensitive     OXACILLIN 0.5 SENSITIVE Sensitive     TETRACYCLINE >=16 RESISTANT Resistant     VANCOMYCIN 1 SENSITIVE Sensitive     TRIMETH/SULFA <=10 SENSITIVE Sensitive     CLINDAMYCIN >=8 RESISTANT Resistant     RIFAMPIN <=0.5 SENSITIVE Sensitive     Inducible Clindamycin NEGATIVE Sensitive     * MODERATE STAPHYLOCOCCUS AUREUS  Culture, blood (Routine X 2) w Reflex to ID Panel     Status: Abnormal (Preliminary result)   Collection Time: 05/08/16  6:25 AM  Result Value Ref Range Status   Specimen Description BLOOD LEFT ARM  Final   Special Requests IN PEDIATRIC BOTTLE 2CC  Final   Culture  Setup Time   Final    GRAM POSITIVE COCCI IN CLUSTERS AEROBIC BOTTLE ONLY CRITICAL VALUE NOTED.  VALUE IS CONSISTENT WITH PREVIOUSLY REPORTED AND CALLED VALUE. Performed at [facility], [address] [place], [place] [address]    Culture STAPHYLOCOCCUS AUREUS (A)  Final   Report Status PENDING  Incomplete  Culture, blood (routine x 2)     Status: Abnormal (Preliminary result)   Collection Time: 05/08/16  5:59 PM  Result Value Ref Range Status   Specimen Description BLOOD LEFT HAND  Final   Special Requests IN PEDIATRIC BOTTLE 1 CC  Final   Culture  Setup Time   Final    GRAM POSITIVE COCCI IN CLUSTERS IN PEDIATRIC BOTTLE CRITICAL RESULT CALLED TO, READ BACK BY AND VERIFIED WITH: Thana Ates [id number] MLM Performed at [facility], [address], [place], [place] [address]    Culture STAPHYLOCOCCUS AUREUS (A)  Final   Report Status PENDING  Incomplete  Culture, blood (routine x 2)     Status: Abnormal (Preliminary result)   Collection Time: 05/08/16  5:59 PM  Result Value Ref Range Status   Specimen Description BLOOD LEFT ARM  Final   Special Requests IN PEDIATRIC BOTTLE 1.5CC  Final   Culture  Setup Time    Final    GRAM POSITIVE COCCI IN CLUSTERS AEROBIC BOTTLE ONLY CRITICAL VALUE NOTED.  VALUE IS CONSISTENT WITH PREVIOUSLY REPORTED AND CALLED VALUE. Performed at [facility], [address], [place], [place] [address]    Culture STAPHYLOCOCCUS AUREUS (A)  Final   Report Status PENDING  Incomplete  Culture, blood (Routine X 2) w Reflex to ID Panel     Status: Abnormal (Preliminary result)   Collection Time: 05/08/16  9:49 PM  Result Value Ref Range Status   Specimen Description BLOOD LEFT HAND  Final   Special Requests IN PEDIATRIC BOTTLE 0.5CC  Final   Culture  Setup Time   Final    GRAM POSITIVE COCCI IN CLUSTERS IN PEDIATRIC BOTTLE CRITICAL RESULT CALLED TO, READ BACK BY AND VERIFIED WITH: BETH GREEN,PHARMD @2339  002/05/18 MKELLY,MLT    Culture (A)  Final    STAPHYLOCOCCUS AUREUS SUSCEPTIBILITIES TO FOLLOW Performed at [facility], [address], [place], [place] [address]    Report Status PENDING  Incomplete   ASSESSMENT: Repeat blood cultures 48 hours into vancomycin therapy are all positive for MRSA again. I order repeat blood cultures this morning but she refused the blood draw. Her most recent echocardiogram shows enlargement in her tricuspid valve vegetation with progressive, severe tricuspid regurgitation and pulmonary hypertension. She was assessed by Dr. Donata Clay in August of last year. He felt that  she did not have an indication for valve replacement at that time and also noted that she was not a candidate for surgery given her active injecting drug use, malnutrition and poor dentition. I suspect that she now has an indication for valve replacement but none of the other factors have changed. She remains a very poor surgical candidate. Her vancomycin trough level has been low. If she allows [place] to do repeat the blood cultures and they remain positive I will consider changing to daptomycin.  I would suggest asking the Palliative Care Team to come back to see her  again. They have visited with her on several occasions during her previous admissions. I think her only hope is drug treatment and 6 weeks of affective IV antibiotic therapy. Unfortunately, she is reluctant to go back to a skilled nursing facility and her options for effective drug treatment are very limited. She has very limited social support from family and friends.  PLAN: 1. Continue adjusted dose vancomycin 2. Repeat blood cultures later today if she will allow 3. Recommend Palliative Care Team consult  Cliffton Asters, MD [facility] [phone] pager   [phone] cell 05/10/2016, 10:17 AM

## 2016-05-10 NOTE — Progress Notes (Signed)
Airborne precautions d/c per Dr. Orvan Falconerampbell verbal order.  Will continue to monitor patient.

## 2016-05-10 NOTE — Consult Note (Signed)
Consultation Note Date: 05/10/2016   Patient Name: Catherine Hernandez  DOB: [birth year]  MRN: [id number]  Age / Sex: 31 y.o., female  PCP: No Pcp Per Patient Referring Physician: Damita Lack, MD  Reason for Consultation: Establishing goals of care  HPI/Patient Profile: 31 y.o. female   admitted on 05/24/2016   31 y.o.femalewith medical history significant for IV drug abuse, endocarditis involving tricuspid valve, and nonadherence with her treatment plan, now presenting to the emergency department with generalized aches and pains, malaise, productive cough, and fatigue.   She has had a complicated recent medical course involving hospitalization in December 2017 with Serratia bacteremia, bilateral pulmonary emboli, and tricuspid valve endocarditis. She was evaluated by CT surgery at that time and deemed to not be a surgical candidate.She was treated with antibiotics in the hospital and discharged to complete a course of Cipro. She returned to the hospital with the starting of January 2018 with sepsis secondary to HCAP and noted to have cavitary pulmonary lesions. She was treated with empiric vancomycin and cefepime in the hospital and per ID consultant, she was not a PICC candidate and was discharged with 1 month of Bactrim which she never filled patient came back to the hospital and found to be afebrile chest x-ray showed patchy bilateral air space opacities likely representing multifocal pneumonia #2 septic emboli or asymmetric edema also possibility.. CT of the cervical spine notable for opacities and nodules in the lung apices with cavitation Blood Cx 1/2 with MRSA   Clinical Assessment and Goals of Care:  Ms Helms is well known to the palliative service, she has been seen previously by my colleague Ms Davis Gourd NP. At that time, patient was processing the serious nature of her illness, palliative services had  established contact with patient's friend and aunt, offered the patient with supportive care and active listening while she was in the hospital.   Patient is unfortunately, now re admitted. She has MRSA bacteremia, endocarditis. Recent ECHO with worsening vegetation. Additionally, she also has been admitted with acute kidney injury, there is significant concern for ongoing drug use. A palliative consult has been requested for ongoing goals of care discussions.   The patient is resting in bed, she recalls having met my colleague last month in the hospital. She is anxious, hyper ventilating. She is restless. Re introduced palliative care as specialized medical care for people living with serious illness. It focuses on providing relief from the symptoms and stress of a serious illness. The goal is to improve quality of life for both the patient and the family.  Patient is unable to continue conversations, is basically not re directable due to severe anxiety. See recommendations below, we will continue to follow along.    There is no established HCPOA agent.     SUMMARY OF RECOMMENDATIONS    Ativan IV now.  Continue scope of current hospitalization.  Patient reportedly again, does not want SNF/PICC. We will continue to follow along and help guide appropriate decision making.  Full code for  now.   Code Status/Advance Care Planning:  Full code    Symptom Management:    Ativan IV once.   Palliative Prophylaxis:   Delirium Protocol  Additional Recommendations (Limitations, Scope, Preferences):  Full Scope Treatment  Psycho-social/Spiritual:   Desire for further Chaplaincy support:yes  Additional Recommendations: Caregiving  Support/Resources  Prognosis:   Unable to determine  Discharge Planning: To Be Determined      Primary Diagnoses: Present on Admission: . Polysubstance abuse . Opioid use disorder, severe, dependence ([place]) . IVDU (intravenous drug user) . HCAP  (healthcare-associated pneumonia) . Endocarditis of tricuspid valve . History of pulmonary embolism . Thrombocytopenia ([place]) . Acute kidney injury ([place]) . Hyponatremia . MRSA bacteremia . Serratia infection . Staphylococcus aureus bacteremia . Septic embolism ([place]) . Tobacco abuse . Symptomatic anemia . Protein-calorie malnutrition, severe ([place]) . Enterococcal bacteremia   I have reviewed the medical record, interviewed the patient and family, and examined the patient. The following aspects are pertinent.  Past Medical History:  Diagnosis Date  . Anemia   . CAP (community acquired pneumonia) 11/16/2015  . Childhood asthma   . Endocarditis   . Enterococcal bacteremia   . Heroin abuse   . Hyponatremia   . Pneumonia 2007  . Polysubstance abuse    Social History   Social History  . Marital status: Single    Spouse name: N/A  . Number of children: N/A  . Years of education: N/A   Occupational History  . unemployed, former Chiropodist    Social History Main Topics  . Smoking status: Current Every Day Smoker    Packs/day: 0.50    Years: 10.00    Types: Cigarettes  . Smokeless tobacco: Never Used  . Alcohol use No  . Drug use: Yes    Types: IV, Cocaine, Heroin, Oxycodone     Comment: 11/17/2015 "been only doing pills lately to try to get offf H"  . Sexual activity: Yes    Birth control/ protection: Condom   Other Topics Concern  . None   Social History Narrative  . None   Family History  Problem Relation Age of Onset  . Anesthesia problems Neg Hx   . Hypotension Neg Hx   . Malignant hyperthermia Neg Hx   . Pseudochol deficiency Neg Hx    Scheduled Meds: . apixaban  5 mg Oral BID  . chlorhexidine  15 mL Mouth Rinse BID  . feeding supplement (ENSURE ENLIVE)  237 mL Oral BID BM  . LORazepam  0.5 mg Intravenous Once  . mouth rinse  15 mL Mouth Rinse q12n4p  . sodium chloride flush  3 mL Intravenous Q12H  . vancomycin  750 mg Intravenous Q24H    Continuous Infusions: PRN Meds:.acetaminophen **OR** acetaminophen, HYDROcodone-acetaminophen, ondansetron **OR** ondansetron (ZOFRAN) IV Medications Prior to Admission:  Prior to Admission medications   Medication Sig Start Date End Date Taking? Authorizing Provider  apixaban (ELIQUIS) 5 MG TABS tablet Take 1 tablet (5 mg total) by mouth every 12 (twelve) hours. Start on 03/31/16 03/31/16  Yes Ripudeep Krystal Eaton, MD  acetaminophen (TYLENOL) 325 MG tablet Take 2 tablets (650 mg total) by mouth every 6 (six) hours as needed for moderate pain. Patient not taking: Reported on 05/25/2016 04/09/16   Robbie Lis, MD  ferrous gluconate (FERGON) 324 MG tablet Take 1 tablet (324 mg total) by mouth 3 (three) times daily with meals. Patient not taking: Reported on 04/10/2016 03/28/16   Ripudeep Krystal Eaton, MD  folic acid (FOLVITE)  1 MG tablet Take 1 tablet (1 mg total) by mouth daily. Patient not taking: Reported on 04/10/2016 04/10/16   Robbie Lis, MD  LORazepam (ATIVAN) 1 MG tablet Take 1 tablet (1 mg total) by mouth every 8 (eight) hours as needed for anxiety. Patient not taking: Reported on 04/10/2016 03/28/16   Ripudeep Krystal Eaton, MD  Multiple Vitamin (MULTIVITAMIN WITH MINERALS) TABS tablet Take 1 tablet by mouth daily. Patient not taking: Reported on 04/10/2016 03/29/16   Ripudeep Krystal Eaton, MD  sulfamethoxazole-trimethoprim (BACTRIM DS,SEPTRA DS) 800-160 MG tablet Take 1 tablet by mouth every 12 (twelve) hours. Patient not taking: Reported on 04/10/2016 04/09/16   Robbie Lis, MD  thiamine 100 MG tablet Take 1 tablet (100 mg total) by mouth daily. Patient not taking: Reported on 04/10/2016 04/10/16   Robbie Lis, MD   No Known Allergies Review of Systems +anxiety   Physical Exam Cachectic Systolic murmur Abdomen non distended No edema Marks on upper extremities Coarse rhonchi anteriorly  Vital Signs: BP 119/78 (BP Location: Right Arm)   Pulse (!) 113   Temp 98 F (36.7 C) (Oral)   Resp 19   Ht _0  (1.651  m)   Wt 42.3 kg (93 lb 4.1 oz)   SpO2 98%   BMI 15.52 kg/m  Pain Assessment: 0-10   Pain Score: 8    SpO2: SpO2: 98 % O2 Device:SpO2: 98 % O2 Flow Rate: .   IO: Intake/output summary:  Intake/Output Summary (Last 24 hours) at 05/10/16 1550 Last data filed at 05/10/16 1300  Gross per 24 hour  Intake              390 ml  Output              900 ml  Net             -510 ml    LBM: Last BM Date: 05/09/16 Baseline Weight: Weight: 43.1 kg (95 lb) Most recent weight: Weight: 42.3 kg (93 lb 4.1 oz)     Palliative Assessment/Data:   Flowsheet Rows   Flowsheet Row Most Recent Value  Intake Tab  Referral Department  Hospitalist  Unit at Time of Referral  Med/Surg Unit  Palliative Care Primary Diagnosis  Sepsis/Infectious Disease  Palliative Care Type  Return patient Palliative Care  Reason for referral  Clarify Goals of Care  Date first seen by Palliative Care  05/10/16  Clinical Assessment  Palliative Performance Scale Score  40%  Pain Max last 24 hours  6  Pain Min Last 24 hours  5  Dyspnea Max Last 24 Hours  7  Dyspnea Min Last 24 hours  6  Nausea Max Last 24 Hours  1  Nausea Min Last 24 Hours  0  Anxiety Max Last 24 Hours  9  Anxiety Min Last 24 Hours  6  Psychosocial & Spiritual Assessment  Palliative Care Outcomes  Patient/Family meeting held?  Yes  Who was at the meeting?  patient who is decisional.   Palliative Care Outcomes  Clarified goals of care      Time In:  1430 Time Out:  1530 Time Total:  60 Greater than 50%  of this time was spent counseling and coordinating care related to the above assessment and plan.  Signed by: Loistine Chance, MD  [phone]  Please contact Palliative Medicine Team phone at [phone] for questions and concerns.  For individual provider: See Shea Evans

## 2016-05-11 LAB — CULTURE, BLOOD (ROUTINE X 2): Culture: NO GROWTH

## 2016-05-11 LAB — GLUCOSE, CAPILLARY: GLUCOSE-CAPILLARY: 119 mg/dL — AB (ref 65–99)

## 2016-05-11 LAB — CREATININE, SERUM
Creatinine, Ser: 1.5 mg/dL — ABNORMAL HIGH (ref 0.44–1.00)
GFR calc Af Amer: 54 mL/min — ABNORMAL LOW (ref >60)
GFR calc non Af Amer: 46 mL/min — ABNORMAL LOW (ref >60)

## 2016-05-11 LAB — CBC
HCT: 24.2 % — ABNORMAL LOW (ref 36.0–46.0)
Hemoglobin: 7.9 g/dL — ABNORMAL LOW (ref 12.0–15.0)
MCH: 27.1 pg (ref 26.0–34.0)
MCHC: 32.6 g/dL (ref 30.0–36.0)
MCV: 83.2 fL (ref 78.0–100.0)
Platelets: 93 10*3/uL — ABNORMAL LOW (ref 150–400)
RBC: 2.91 MIL/uL — ABNORMAL LOW (ref 3.87–5.11)
RDW: 19.9 % — ABNORMAL HIGH (ref 11.5–15.5)
WBC: 13.2 10*3/uL — ABNORMAL HIGH (ref 4.0–10.5)

## 2016-05-11 LAB — INFLUENZA PANEL BY PCR (TYPE A & B)
Influenza A By PCR: NEGATIVE
Influenza B By PCR: NEGATIVE

## 2016-05-11 LAB — ACID FAST SMEAR (AFB, MYCOBACTERIA): Acid Fast Smear: NEGATIVE

## 2016-05-11 MED ORDER — HYDROXYZINE HCL 25 MG PO TABS
25.0000 mg | ORAL_TABLET | Freq: Three times a day (TID) | ORAL | Status: DC | PRN
Start: 2016-05-11 — End: 2016-05-16
  Administered 2016-05-11 – 2016-05-14 (×9): 25 mg via ORAL
  Filled 2016-05-11 (×9): qty 1

## 2016-05-11 MED ORDER — LEVALBUTEROL HCL 0.63 MG/3ML IN NEBU
0.6300 mg | INHALATION_SOLUTION | Freq: Four times a day (QID) | RESPIRATORY_TRACT | Status: DC
  Administered 2016-05-11 – 2016-05-12 (×5): 0.63 mg via RESPIRATORY_TRACT
  Filled 2016-05-11 (×5): qty 3

## 2016-05-11 MED ORDER — BENZONATATE 100 MG PO CAPS
200.0000 mg | ORAL_CAPSULE | Freq: Three times a day (TID) | ORAL | Status: DC
  Administered 2016-05-11 – 2016-05-16 (×18): 200 mg via ORAL
  Filled 2016-05-11 (×20): qty 2

## 2016-05-11 MED ORDER — SALINE SPRAY 0.65 % NA SOLN
1.0000 | NASAL | Status: DC | PRN
  Filled 2016-05-11: qty 44

## 2016-05-11 MED ORDER — SODIUM CHLORIDE 0.9 % IV SOLN
350.0000 mg | INTRAVENOUS | Status: DC
  Administered 2016-05-11 – 2016-05-14 (×4): 350 mg via INTRAVENOUS
  Filled 2016-05-11 (×5): qty 7

## 2016-05-11 NOTE — Progress Notes (Signed)
CSW responded to active orders for social work consults.  Pt presented as melancholy and calm initially, but as the CSW offered pt resources for homelessness and substance abuse resources, the pt became agitated.  Pt stated she did not have a problem with alcohol and substances.  CSW asked pt if she would like to complete SBIRT and pt refused.  Pt asked CSW to leave so she could "get some sleep".  CSW asked if he could call the nurse for the pt, or if there was anything else the pt needed.  Pt replied, "No, please leave".  CSW left resources in pt's room for the pt and left.     Dorothe PeaJonathan F. Kelilah Hebard, Theresia MajorsLCSWA, LCAS Clinical Social Worker Ph: [phone]

## 2016-05-11 NOTE — Progress Notes (Signed)
Patient ID: Catherine Hernandez, female   DOB: Feb 24, 1986, 31 y.o.   MRN: [id number]         [facility]  Date of Admission:  05/21/2016           Day 6 vancomycin  Principal Problem:   Endocarditis of tricuspid valve Active Problems:   HCAP (healthcare-associated pneumonia)   MRSA bacteremia   Enterococcal bacteremia   Staphylococcus aureus bacteremia   Serratia infection   Polysubstance abuse   IVDU (intravenous drug user)   Opioid use disorder, severe, dependence (HCC)   Hyponatremia   Thrombocytopenia (HCC)   Cavitary lung disease   Poor dentition   Acute kidney injury (HCC)   Protein-calorie malnutrition, severe (HCC)   Symptomatic anemia   Septic embolism (HCC)   Tobacco abuse   History of pulmonary embolism   . apixaban  5 mg Oral BID  . benzonatate  200 mg Oral TID  . chlorhexidine  15 mL Mouth Rinse BID  . feeding supplement (ENSURE ENLIVE)  237 mL Oral BID BM  . levalbuterol  0.63 mg Nebulization Q6H  . mouth rinse  15 mL Mouth Rinse q12n4p  . sodium chloride flush  3 mL Intravenous Q12H  . vancomycin  750 mg Intravenous Q24H    SUBJECTIVE: [name] continues to complain of chest and back pain. She denies diarrhea.  Review of Systems: Review of Systems  Constitutional: Positive for malaise/fatigue and weight loss. Negative for chills, diaphoresis and fever.  HENT: Negative for sore throat.   Respiratory: Positive for cough, hemoptysis, sputum production and shortness of breath.   Cardiovascular: Positive for chest pain.  Gastrointestinal: Negative for abdominal pain, diarrhea, heartburn, nausea and vomiting.  Genitourinary: Negative for dysuria and frequency.  Musculoskeletal: Negative for joint pain and myalgias.  Skin: Negative for rash.  Neurological: Negative for dizziness and headaches.  Psychiatric/Behavioral: Positive for depression and substance abuse. The patient is not nervous/anxious.     Past Medical History:    Diagnosis Date  . Anemia   . CAP (community acquired pneumonia) 11/16/2015  . Childhood asthma   . Endocarditis   . Enterococcal bacteremia   . Heroin abuse   . Hyponatremia   . Pneumonia 2007  . Polysubstance abuse     Social History  Substance Use Topics  . Smoking status: Current Every Day Smoker    Packs/day: 0.50    Years: 10.00    Types: Cigarettes  . Smokeless tobacco: Never Used  . Alcohol use No    Family History  Problem Relation Age of Onset  . Anesthesia problems Neg Hx   . Hypotension Neg Hx   . Malignant hyperthermia Neg Hx   . Pseudochol deficiency Neg Hx    No Known Allergies  OBJECTIVE: Vitals:   05/11/16 0006 05/11/16 0351 05/11/16 1039 05/11/16 1608  BP:  (!) 120/97  132/79  Pulse:  (!) 113  (!) 126  Resp:  (!) 26  20  Temp: 98 F (36.7 C) 97.9 F (36.6 C)  99.8 F (37.7 C)  TempSrc: Oral Oral  Oral  SpO2:  94% 96% 95%  Weight:  95 lb 10.9 oz (43.4 kg)    Height:       Body mass index is 15.92 kg/m.  Physical Exam  Constitutional: She is oriented to person, place, and time.  She was sleeping quietly in bed.   HENT:  Mouth/Throat: No oropharyngeal exudate.  Poor dentition.  Eyes: Conjunctivae are normal.  Cardiovascular:  Normal rate and regular rhythm.   No murmur heard. Pulmonary/Chest: Effort normal. She has no wheezes. She has rales.  Abdominal: Soft. There is no tenderness.  Musculoskeletal: Normal range of motion. She exhibits no edema or tenderness.  Neurological: She is alert and oriented to person, place, and time.  Skin:  Diffuse, excoriated lesions and scars. Multiple tattoos.    Lab Results Lab Results  Component Value Date   WBC 13.2 (H) 05/11/2016   HGB 7.9 (L) 05/11/2016   HCT 24.2 (L) 05/11/2016   MCV 83.2 05/11/2016   PLT 93 (L) 05/11/2016    Lab Results  Component Value Date   CREATININE 1.50 (H) 05/11/2016   BUN 46 (H) 05/09/2016   NA 132 (L) 05/09/2016   K 4.2 05/09/2016   CL 105 05/09/2016   CO2  16 (L) 05/09/2016    Lab Results  Component Value Date   ALT 8 (L) 05/08/2016   AST 14 (L) 05/08/2016   ALKPHOS 95 05/08/2016   BILITOT 0.7 05/08/2016     Microbiology: Recent Results (from the past 240 hour(s))  Urine culture     Status: Abnormal   Collection Time: 05-09-2016  3:27 PM  Result Value Ref Range Status   Specimen Description URINE, RANDOM  Final   Special Requests NONE  Final   Culture (A)  Final    <10,000 COLONIES/mL INSIGNIFICANT GROWTH Performed at [facility], [address], [place], [place] [address]    Report Status 05/08/2016 FINAL  Final  Blood culture (routine x 2)     Status: Abnormal   Collection Time: 2016-05-09  5:35 PM  Result Value Ref Range Status   Specimen Description BLOOD LEFT ARM  Final   Special Requests IN PEDIATRIC BOTTLE 2CC  Final   Culture  Setup Time   Final    GRAM POSITIVE COCCI IN CLUSTERS IN PEDIATRIC BOTTLE CRITICAL RESULT CALLED TO, READ BACK BY AND VERIFIED WITH: T. GREEN PHARMD, AT 4098 05/07/16 BY Renato Shin Performed at [facility], [address], [place], [place] [address]    Culture METHICILLIN RESISTANT STAPHYLOCOCCUS AUREUS (A)  Final   Report Status 05/09/2016 FINAL  Final   Organism ID, Bacteria METHICILLIN RESISTANT STAPHYLOCOCCUS AUREUS  Final      Susceptibility   Methicillin resistant staphylococcus aureus - MIC*    CIPROFLOXACIN <=0.5 SENSITIVE Sensitive     ERYTHROMYCIN >=8 RESISTANT Resistant     GENTAMICIN <=0.5 SENSITIVE Sensitive     OXACILLIN >=4 RESISTANT Resistant     TETRACYCLINE >=16 RESISTANT Resistant     VANCOMYCIN 1 SENSITIVE Sensitive     TRIMETH/SULFA <=10 SENSITIVE Sensitive     CLINDAMYCIN >=8 RESISTANT Resistant     RIFAMPIN <=0.5 SENSITIVE Sensitive     Inducible Clindamycin NEGATIVE Sensitive     * METHICILLIN RESISTANT STAPHYLOCOCCUS AUREUS  Blood Culture ID Panel (Reflexed)     Status: Abnormal   Collection Time: 05/09/16  5:35 PM  Result Value Ref Range Status    Enterococcus species NOT DETECTED NOT DETECTED Final   Listeria monocytogenes NOT DETECTED NOT DETECTED Final   Staphylococcus species DETECTED (A) NOT DETECTED Final    Comment: CRITICAL RESULT CALLED TO, READ BACK BY AND VERIFIED WITH: T. GREEN PHARMD, AT 7829 05/07/16 BY D. VANHOOK    Staphylococcus aureus DETECTED (A) NOT DETECTED Final    Comment: Methicillin (oxacillin)-resistant Staphylococcus aureus (MRSA). MRSA is predictably resistant to beta-lactam antibiotics (except ceftaroline). Preferred therapy is vancomycin unless  clinically contraindicated. Patient requires contact precautions if  hospitalized. CRITICAL RESULT CALLED TO, READ BACK BY AND VERIFIED WITH: T. GREEN PHARMD, AT 4098 05/07/16 BY D. VANHOOK    Methicillin resistance DETECTED (A) NOT DETECTED Final    Comment: CRITICAL RESULT CALLED TO, READ BACK BY AND VERIFIED WITH: T. GREEN PHARMD, AT 1191 05/07/16 BY D. VANHOOK    Streptococcus species NOT DETECTED NOT DETECTED Final   Streptococcus agalactiae NOT DETECTED NOT DETECTED Final   Streptococcus pneumoniae NOT DETECTED NOT DETECTED Final   Streptococcus pyogenes NOT DETECTED NOT DETECTED Final   Acinetobacter baumannii NOT DETECTED NOT DETECTED Final   Enterobacteriaceae species NOT DETECTED NOT DETECTED Final   Enterobacter cloacae complex NOT DETECTED NOT DETECTED Final   Escherichia coli NOT DETECTED NOT DETECTED Final   Klebsiella oxytoca NOT DETECTED NOT DETECTED Final   Klebsiella pneumoniae NOT DETECTED NOT DETECTED Final   Proteus species NOT DETECTED NOT DETECTED Final   Serratia marcescens NOT DETECTED NOT DETECTED Final   Haemophilus influenzae NOT DETECTED NOT DETECTED Final   Neisseria meningitidis NOT DETECTED NOT DETECTED Final   Pseudomonas aeruginosa NOT DETECTED NOT DETECTED Final   Candida albicans NOT DETECTED NOT DETECTED Final   Candida glabrata NOT DETECTED NOT DETECTED Final   Candida krusei NOT DETECTED NOT DETECTED Final   Candida  parapsilosis NOT DETECTED NOT DETECTED Final   Candida tropicalis NOT DETECTED NOT DETECTED Final    Comment: Performed at [facility], [address] [place], [place] [address]  Blood culture (routine x 2)     Status: None   Collection Time: 05/05/2016  6:18 PM  Result Value Ref Range Status   Specimen Description BLOOD RIGHT NECK  Final   Special Requests IN PEDIATRIC BOTTLE 2CC  Final   Culture   Final    NO GROWTH 5 DAYS Performed at [facility], [address], [place], [place] [address]    Report Status 05/11/2016 FINAL  Final  MRSA PCR Screening     Status: None   Collection Time: 05/07/16 12:57 AM  Result Value Ref Range Status   MRSA by PCR NEGATIVE NEGATIVE Final    Comment:        The GeneXpert MRSA Assay (FDA approved for NASAL specimens only), is one component of a comprehensive MRSA colonization surveillance program. It is not intended to diagnose MRSA infection nor to guide or monitor treatment for MRSA infections.   Acid Fast Smear (AFB)     Status: None   Collection Time: 05/07/16  1:20 PM  Result Value Ref Range Status   AFB Specimen Processing Concentration  Final   Acid Fast Smear Negative  Final    Comment: (NOTE) Performed At: [facility] [address] [place], [place] [id number] Mila Homer MD GE:[id number]    Source (AFB) SPUTUM  Final  Culture, sputum-assessment     Status: None   Collection Time: 05/07/16  1:22 PM  Result Value Ref Range Status   Specimen Description SPUTUM  Final   Special Requests Immunocompromised  Final   Sputum evaluation THIS SPECIMEN IS ACCEPTABLE FOR SPUTUM CULTURE  Final   Report Status 05/07/2016 FINAL  Final  Culture, respiratory (NON-Expectorated)     Status: None   Collection Time: 05/07/16  1:22 PM  Result Value Ref Range Status   Specimen Description SPUTUM  Final   Special Requests Immunocompromised Reflexed from [id number]  Final   Gram Stain   Final  ABUNDANT WBC PRESENT,  PREDOMINANTLY PMN MODERATE YEAST MODERATE GRAM POSITIVE RODS Performed at [facility], [address], [place], [place] [address]    Culture MODERATE STAPHYLOCOCCUS AUREUS  Final   Report Status 05/10/2016 FINAL  Final   Organism ID, Bacteria STAPHYLOCOCCUS AUREUS  Final      Susceptibility   Staphylococcus aureus - MIC*    CIPROFLOXACIN <=0.5 SENSITIVE Sensitive     ERYTHROMYCIN >=8 RESISTANT Resistant     GENTAMICIN <=0.5 SENSITIVE Sensitive     OXACILLIN 0.5 SENSITIVE Sensitive     TETRACYCLINE >=16 RESISTANT Resistant     VANCOMYCIN 1 SENSITIVE Sensitive     TRIMETH/SULFA <=10 SENSITIVE Sensitive     CLINDAMYCIN >=8 RESISTANT Resistant     RIFAMPIN <=0.5 SENSITIVE Sensitive     Inducible Clindamycin NEGATIVE Sensitive     * MODERATE STAPHYLOCOCCUS AUREUS  Culture, blood (Routine X 2) w Reflex to ID Panel     Status: Abnormal   Collection Time: 05/08/16  6:25 AM  Result Value Ref Range Status   Specimen Description BLOOD LEFT ARM  Final   Special Requests IN PEDIATRIC BOTTLE 2CC  Final   Culture  Setup Time   Final    GRAM POSITIVE COCCI IN CLUSTERS AEROBIC BOTTLE ONLY CRITICAL VALUE NOTED.  VALUE IS CONSISTENT WITH PREVIOUSLY REPORTED AND CALLED VALUE.    Culture (A)  Final    STAPHYLOCOCCUS AUREUS SUSCEPTIBILITIES PERFORMED ON PREVIOUS CULTURE WITHIN THE LAST 5 DAYS. Performed at [facility], [address], [place], [place] [address]    Report Status 05/11/2016 FINAL  Final  Culture, blood (routine x 2)     Status: Abnormal   Collection Time: 05/08/16  5:59 PM  Result Value Ref Range Status   Specimen Description BLOOD LEFT HAND  Final   Special Requests IN PEDIATRIC BOTTLE 1 CC  Final   Culture  Setup Time   Final    GRAM POSITIVE COCCI IN CLUSTERS IN PEDIATRIC BOTTLE CRITICAL RESULT CALLED TO, READ BACK BY AND VERIFIED WITH: PHARMD J GADHIA [id number] MLM    Culture (A)  Final    STAPHYLOCOCCUS AUREUS SUSCEPTIBILITIES PERFORMED ON PREVIOUS  CULTURE WITHIN THE LAST 5 DAYS. Performed at [facility], [address], [place], [place] [address]    Report Status 05/11/2016 FINAL  Final  Culture, blood (routine x 2)     Status: Abnormal   Collection Time: 05/08/16  5:59 PM  Result Value Ref Range Status   Specimen Description BLOOD LEFT ARM  Final   Special Requests IN PEDIATRIC BOTTLE 1.5CC  Final   Culture  Setup Time   Final    GRAM POSITIVE COCCI IN CLUSTERS AEROBIC BOTTLE ONLY CRITICAL VALUE NOTED.  VALUE IS CONSISTENT WITH PREVIOUSLY REPORTED AND CALLED VALUE.    Culture (A)  Final    STAPHYLOCOCCUS AUREUS SUSCEPTIBILITIES PERFORMED ON PREVIOUS CULTURE WITHIN THE LAST 5 DAYS. Performed at [facility], [address] [place], [place] [address]    Report Status 05/11/2016 FINAL  Final  Culture, blood (Routine X 2) w Reflex to ID Panel     Status: Abnormal   Collection Time: 05/08/16  9:49 PM  Result Value Ref Range Status   Specimen Description BLOOD LEFT HAND  Final   Special Requests IN PEDIATRIC BOTTLE 0.5CC  Final   Culture  Setup Time   Final    GRAM POSITIVE COCCI IN CLUSTERS IN PEDIATRIC BOTTLE CRITICAL RESULT CALLED TO,  READ BACK BY AND VERIFIED WITH: BETH GREEN,PHARMD @2339  002/05/18 MKELLY,MLT Performed at [facility], [address] [place], [place] [address]    Culture METHICILLIN RESISTANT STAPHYLOCOCCUS AUREUS (A)  Final   Report Status 05/11/2016 FINAL  Final   Organism ID, Bacteria METHICILLIN RESISTANT STAPHYLOCOCCUS AUREUS  Final      Susceptibility   Methicillin resistant staphylococcus aureus - MIC*    CIPROFLOXACIN <=0.5 SENSITIVE Sensitive     ERYTHROMYCIN >=8 RESISTANT Resistant     GENTAMICIN <=0.5 SENSITIVE Sensitive     OXACILLIN >=4 RESISTANT Resistant     TETRACYCLINE >=16 RESISTANT Resistant     VANCOMYCIN <=0.5 SENSITIVE Sensitive     TRIMETH/SULFA <=10 SENSITIVE Sensitive     CLINDAMYCIN >=8 RESISTANT Resistant     RIFAMPIN <=0.5 SENSITIVE Sensitive      Inducible Clindamycin NEGATIVE Sensitive     * METHICILLIN RESISTANT STAPHYLOCOCCUS AUREUS  Culture, blood (routine x 2)     Status: None (Preliminary result)   Collection Time: 05/10/16 11:18 AM  Result Value Ref Range Status   Specimen Description BLOOD RIGHT ARM  Final   Special Requests IN PEDIATRIC BOTTLE 3CC  Final   Culture  Setup Time   Final    GRAM POSITIVE COCCI IN CLUSTERS IN PEDIATRIC BOTTLE CRITICAL VALUE NOTED.  VALUE IS CONSISTENT WITH PREVIOUSLY REPORTED AND CALLED VALUE.    Culture   Final    NO GROWTH < 24 HOURS Performed at [facility], [address] [place], [place] [address]    Report Status PENDING  Incomplete   ASSESSMENT: She developed new fever today to 103. Blood cultures on 2/3, 2/5 and 2/7 remain positive. Her vancomycin trough level II days ago was subtherapeutic. I will change her to high-dose IV daptomycin. She has a high burden of septic pulmonary emboli in her lungs.  PLAN: 1. Change vancomycin to daptomycin 8 mg/kg daily  Cliffton AstersJohn Bernardine Langworthy, MD [facility] [phone] pager   [phone] cell 05/11/2016, 5:02 PM

## 2016-05-11 NOTE — Progress Notes (Signed)
[facility]  PROGRESS NOTE  Catherine Hernandez ZOX:[id number] DOB: 1985-08-01 DOA: 05/14/2016 PCP: No PCP Per Patient   Brief HPI:    31 y.o. female with medical history significant for IV drug abuse, endocarditis involving tricuspid valve, and nonadherence with her treatment plan, now presenting to the emergency department with generalized aches and pains, malaise, productive cough, and fatigue.   She has had a complicated recent medical course involving hospitalization in December 2017 with Serratia bacteremia, bilateral pulmonary emboli, and tricuspid valve endocarditis. She was evaluated by CT surgery at that time and deemed to not be a surgical candidate.She was treated with antibiotics in the hospital and discharged to complete a course of Cipro. She returned to the hospital with the starting of January 2018 with sepsis secondary to HCAP and noted to have cavitary pulmonary lesions. She was treated with empiric vancomycin and cefepime in the hospital and per ID consultant, she was not a PICC candidate and was discharged with 1 month of Bactrim which she never filled patient came back to the hospital and found to be afebrile chest x-ray showed patchy bilateral air space opacities likely representing multifocal pneumonia #2 septic emboli or asymmetric edema also possibility.. CT of the cervical spine notable for opacities and nodules in the lung apices with cavitation Blood Cx 1/2 with MRSA now  Subjective  Asking for ativan   Assessment/Plan:   Sepsis/MRSA bacteremia- blood cultures in 1/2 bottles  With MRSA from 2/3 -repeat cultures pending -ID consulting, continuing Vanc/stopped Cefepime, non complaint with recent Serratia endocarditis of tricuspid valve -2D ECHO - worsening of her vegetation.  -cont vanc -Palliative care team re-consulted- very poor prognosis  Recent TV endocarditis with Severe TR, multifocal pneumonia/septic embolization -treated with IV Vanc -seen by CVTS, not a  surgical candidate and Palliative consult obtained last admission, medical rx pursued but didn't fill script for bactrim she was discharged on in January -Patient was not a PICC line candidate at recent discharge due to ongoing IV drug abuse -suspect progression of disease -ID following -worsening of her vegetation on Echo performed on 05/08/16  Multifocal pneumonia/Septic embolization from IE -with evolution to cavitary lung disease -now on Iv Vanc, ID following -FU sputum Cx -clinically do not suspect TB, will FU AFB smears   Acute kidney injury- patient baseline creatinine 0.6 in January 2018, admitted with creatinine of 2.91. - creatinine down, continue IVF - Caution with Vanc dosing per PharmD -no hydronephrosis on Renal US  Hypokalemia- -replaced  Thrombocytopenia- -Stop heparin and resume Eliquis she was on previously for h/o PE  History of pulmonary embolism -- patient was diagnosed with bilateral PEs in December 2017 and has been on eliquis, since creatinine improving will ask PharmD to transition back to gfr adjusted dose of Eliquis  Polysubstance abuse- patient has ongoing IV drug abuse. UDS positive for cocaine, opiates, benzodiazepines. Social work consultation requested.  Anemia- unknown source and etiology -will check occult stool -transfuse for < 7  Hyponatremia -IVF  -BMP in AM   DVT prophylaxis: Eliquis Code Status: Full code Family Communication: No family present at bedside  Disposition Plan:  Her prognosis remains poor at this time.   Consultants:  ID  Palliative care   Antibiotics:   Anti-infectives    Start     Dose/Rate Route Frequency Ordered Stop   05/09/16 2200  vancomycin (VANCOCIN) IVPB 750 mg/150 ml premix     750 mg 150 mL/hr over 60 Minutes Intravenous Every 24 hours 05/09/16 2143  05/07/16 2000  vancomycin (VANCOCIN) 500 mg in sodium chloride 0.9 % 100 mL IVPB  Status:  Discontinued     500 mg 100 mL/hr over 60 Minutes  Intravenous Every 24 hours 05/19/2016 1824 05/09/16 2143   05/07/16 2000  ceFEPIme (MAXIPIME) 1 g in dextrose 5 % 50 mL IVPB  Status:  Discontinued     1 g 100 mL/hr over 30 Minutes Intravenous Every 24 hours 05/07/2016 1824 05/07/16 1546   05/12/2016 1800  ceFEPIme (MAXIPIME) 2 g in dextrose 5 % 50 mL IVPB     2 g 100 mL/hr over 30 Minutes Intravenous  Once 05/12/2016 1754 05/27/2016 1944   05/14/2016 1800  vancomycin (VANCOCIN) IVPB 1000 mg/200 mL premix     1,000 mg 200 mL/hr over 60 Minutes Intravenous  Once 05/27/2016 1754 05/29/2016 2030       Objective   Vitals:   05/10/16 2130 05/10/16 2219 05/11/16 0006 05/11/16 0351  BP: 129/73   (!) 120/97  Pulse: (!) 135   (!) 113  Resp: (!) 28   (!) 26  Temp: (!) 103 F (39.4 C) (!) 102.3 F (39.1 C) 98 F (36.7 C) 97.9 F (36.6 C)  TempSrc: Oral Oral Oral Oral  SpO2: 93%   94%  Weight:    43.4 kg (95 lb 10.9 oz)  Height:        Intake/Output Summary (Last 24 hours) at 05/11/16 0830 Last data filed at 05/11/16 [id number]  Gross per 24 hour  Intake             1130 ml  Output             1300 ml  Net             -170 ml   Filed Weights   05/09/16 0500 05/10/16 0555 05/11/16 0351  Weight: 41.7 kg (91 lb 14.9 oz) 42.3 kg (93 lb 4.1 oz) 43.4 kg (95 lb 10.9 oz)     Physical Examination:  General exam: thinly built, cachectic- poor dentation Respiratory system: Bilateral rhonchi auscultated- wheezing Cardiovascular system:  RRR, systolic murmur noted, tachycardic GI system: Abdomen is nondistended, positive tenderness in both right and left upper quadrant. No organomegaly.  Central nervous system. No focal neurological deficits. 5 x 5 power in all extremities. Skin: Excoriations noted in the extremities and trunk..    Data Reviewed: I have personally reviewed following labs and imaging studies  CBG:  Recent Labs Lab 05/07/16 0754 05/08/16 0758 05/09/16 0726 05/10/16 0731 05/11/16 0757  GLUCAP 216* 92 79 99 119*    CBC:  Recent  Labs Lab 05/19/2016 1554  05/07/16 0906 05/08/16 0625 05/09/16 0528 05/10/16 0559 05/11/16 0545  WBC 16.2*  --  12.7* 12.7* 15.3* 19.6* 13.2*  NEUTROABS 13.9*  --  10.0*  --   --   --   --   HGB 12.1  --  10.1* 11.7* 9.4* 8.7* 7.9*  HCT 34.9*  --  29.4* 35.3* 28.9* 27.3* 24.2*  MCV 78.8  --  79.9 81.7 83.5 82.0 83.2  PLT 81*  < > 73* 73* 97* 107* 93*  < > = values in this interval not displayed.  Basic Metabolic Panel:  Recent Labs Lab 05/31/2016 1554 05/07/16 0906 05/08/16 0625 05/09/16 0528 05/11/16 0545  NA 127* 129* 130* 132*  --   K 3.4* 3.2* 4.1 4.2  --   CL 94* 101 103 105  --   CO2 17* 18* 18* 16*  --  GLUCOSE 119* 168* 96 97  --   BUN 64* 62* 53* 46*  --   CREATININE 2.91* 2.78* 2.38* 2.18* 1.50*  CALCIUM 7.8* 7.3* 7.6* 7.4*  --   MG  --  2.2  --   --   --     Recent Results (from the past 240 hour(s))  Urine culture     Status: Abnormal   Collection Time: 05/17/2016  3:27 PM  Result Value Ref Range Status   Specimen Description URINE, RANDOM  Final   Special Requests NONE  Final   Culture (A)  Final    <10,000 COLONIES/mL INSIGNIFICANT GROWTH Performed at [facility], [address], [place], [place] [address]    Report Status 05/08/2016 FINAL  Final  Blood culture (routine x 2)     Status: Abnormal   Collection Time: 05/05/2016  5:35 PM  Result Value Ref Range Status   Specimen Description BLOOD LEFT ARM  Final   Special Requests IN PEDIATRIC BOTTLE 2CC  Final   Culture  Setup Time   Final    GRAM POSITIVE COCCI IN CLUSTERS IN PEDIATRIC BOTTLE CRITICAL RESULT CALLED TO, READ BACK BY AND VERIFIED WITH: T. GREEN PHARMD, AT 6045 05/07/16 BY Renato Shin Performed at [facility], [address], [place], [place] [address]    Culture METHICILLIN RESISTANT STAPHYLOCOCCUS AUREUS (A)  Final   Report Status 05/09/2016 FINAL  Final   Organism ID, Bacteria METHICILLIN RESISTANT STAPHYLOCOCCUS AUREUS  Final      Susceptibility   Methicillin resistant  staphylococcus aureus - MIC*    CIPROFLOXACIN <=0.5 SENSITIVE Sensitive     ERYTHROMYCIN >=8 RESISTANT Resistant     GENTAMICIN <=0.5 SENSITIVE Sensitive     OXACILLIN >=4 RESISTANT Resistant     TETRACYCLINE >=16 RESISTANT Resistant     VANCOMYCIN 1 SENSITIVE Sensitive     TRIMETH/SULFA <=10 SENSITIVE Sensitive     CLINDAMYCIN >=8 RESISTANT Resistant     RIFAMPIN <=0.5 SENSITIVE Sensitive     Inducible Clindamycin NEGATIVE Sensitive     * METHICILLIN RESISTANT STAPHYLOCOCCUS AUREUS  Blood Culture ID Panel (Reflexed)     Status: Abnormal   Collection Time: 05/29/2016  5:35 PM  Result Value Ref Range Status   Enterococcus species NOT DETECTED NOT DETECTED Final   Listeria monocytogenes NOT DETECTED NOT DETECTED Final   Staphylococcus species DETECTED (A) NOT DETECTED Final    Comment: CRITICAL RESULT CALLED TO, READ BACK BY AND VERIFIED WITH: T. GREEN PHARMD, AT 1914 05/07/16 BY D. VANHOOK    Staphylococcus aureus DETECTED (A) NOT DETECTED Final    Comment: Methicillin (oxacillin)-resistant Staphylococcus aureus (MRSA). MRSA is predictably resistant to beta-lactam antibiotics (except ceftaroline). Preferred therapy is vancomycin unless clinically contraindicated. Patient requires contact precautions if  hospitalized. CRITICAL RESULT CALLED TO, READ BACK BY AND VERIFIED WITH: T. GREEN PHARMD, AT 7829 05/07/16 BY D. VANHOOK    Methicillin resistance DETECTED (A) NOT DETECTED Final    Comment: CRITICAL RESULT CALLED TO, READ BACK BY AND VERIFIED WITH: T. GREEN PHARMD, AT 5621 05/07/16 BY D. VANHOOK    Streptococcus species NOT DETECTED NOT DETECTED Final   Streptococcus agalactiae NOT DETECTED NOT DETECTED Final   Streptococcus pneumoniae NOT DETECTED NOT DETECTED Final   Streptococcus pyogenes NOT DETECTED NOT DETECTED Final   Acinetobacter baumannii NOT DETECTED NOT DETECTED Final   Enterobacteriaceae species NOT DETECTED NOT DETECTED Final   Enterobacter cloacae complex NOT DETECTED NOT  DETECTED Final   Escherichia  coli NOT DETECTED NOT DETECTED Final   Klebsiella oxytoca NOT DETECTED NOT DETECTED Final   Klebsiella pneumoniae NOT DETECTED NOT DETECTED Final   Proteus species NOT DETECTED NOT DETECTED Final   Serratia marcescens NOT DETECTED NOT DETECTED Final   Haemophilus influenzae NOT DETECTED NOT DETECTED Final   Neisseria meningitidis NOT DETECTED NOT DETECTED Final   Pseudomonas aeruginosa NOT DETECTED NOT DETECTED Final   Candida albicans NOT DETECTED NOT DETECTED Final   Candida glabrata NOT DETECTED NOT DETECTED Final   Candida krusei NOT DETECTED NOT DETECTED Final   Candida parapsilosis NOT DETECTED NOT DETECTED Final   Candida tropicalis NOT DETECTED NOT DETECTED Final    Comment: Performed at [facility], [address], [place], [place] [address]  Blood culture (routine x 2)     Status: None (Preliminary result)   Collection Time: 05/05/2016  6:18 PM  Result Value Ref Range Status   Specimen Description BLOOD RIGHT NECK  Final   Special Requests IN PEDIATRIC BOTTLE 2CC  Final   Culture   Final    NO GROWTH 4 DAYS Performed at [facility], [address] [place], [place] [address]    Report Status PENDING  Incomplete  MRSA PCR Screening     Status: None   Collection Time: 05/07/16 12:57 AM  Result Value Ref Range Status   MRSA by PCR NEGATIVE NEGATIVE Final    Comment:        The GeneXpert MRSA Assay (FDA approved for NASAL specimens only), is one component of a comprehensive MRSA colonization surveillance program. It is not intended to diagnose MRSA infection nor to guide or monitor treatment for MRSA infections.   Acid Fast Smear (AFB)     Status: None   Collection Time: 05/07/16  1:20 PM  Result Value Ref Range Status   AFB Specimen Processing Concentration  Final   Acid Fast Smear Negative  Final    Comment: (NOTE) Performed At: [facility] [address] [place], [place] [id number] Mila Homer MD  QM:[id number]    Source (AFB) SPUTUM  Final  Culture, sputum-assessment     Status: None   Collection Time: 05/07/16  1:22 PM  Result Value Ref Range Status   Specimen Description SPUTUM  Final   Special Requests Immunocompromised  Final   Sputum evaluation THIS SPECIMEN IS ACCEPTABLE FOR SPUTUM CULTURE  Final   Report Status 05/07/2016 FINAL  Final  Culture, respiratory (NON-Expectorated)     Status: None   Collection Time: 05/07/16  1:22 PM  Result Value Ref Range Status   Specimen Description SPUTUM  Final   Special Requests Immunocompromised Reflexed from [id number]  Final   Gram Stain   Final    ABUNDANT WBC PRESENT, PREDOMINANTLY PMN MODERATE YEAST MODERATE GRAM POSITIVE RODS Performed at [facility], [address], [place], [place] [address]    Culture MODERATE STAPHYLOCOCCUS AUREUS  Final   Report Status 05/10/2016 FINAL  Final   Organism ID, Bacteria STAPHYLOCOCCUS AUREUS  Final      Susceptibility   Staphylococcus aureus - MIC*    CIPROFLOXACIN <=0.5 SENSITIVE Sensitive     ERYTHROMYCIN >=8 RESISTANT Resistant     GENTAMICIN <=0.5 SENSITIVE Sensitive     OXACILLIN 0.5 SENSITIVE Sensitive     TETRACYCLINE >=16 RESISTANT Resistant     VANCOMYCIN 1 SENSITIVE Sensitive     TRIMETH/SULFA <=10 SENSITIVE Sensitive     CLINDAMYCIN >=8 RESISTANT Resistant  RIFAMPIN <=0.5 SENSITIVE Sensitive     Inducible Clindamycin NEGATIVE Sensitive     * MODERATE STAPHYLOCOCCUS AUREUS  Culture, blood (Routine X 2) w Reflex to ID Panel     Status: Abnormal   Collection Time: 05/08/16  6:25 AM  Result Value Ref Range Status   Specimen Description BLOOD LEFT ARM  Final   Special Requests IN PEDIATRIC BOTTLE 2CC  Final   Culture  Setup Time   Final    GRAM POSITIVE COCCI IN CLUSTERS AEROBIC BOTTLE ONLY CRITICAL VALUE NOTED.  VALUE IS CONSISTENT WITH PREVIOUSLY REPORTED AND CALLED VALUE.    Culture (A)  Final    STAPHYLOCOCCUS AUREUS SUSCEPTIBILITIES PERFORMED ON PREVIOUS  CULTURE WITHIN THE LAST 5 DAYS. Performed at [facility], [address], [place], [place] [address]    Report Status 05/11/2016 FINAL  Final  Culture, blood (routine x 2)     Status: Abnormal   Collection Time: 05/08/16  5:59 PM  Result Value Ref Range Status   Specimen Description BLOOD LEFT HAND  Final   Special Requests IN PEDIATRIC BOTTLE 1 CC  Final   Culture  Setup Time   Final    GRAM POSITIVE COCCI IN CLUSTERS IN PEDIATRIC BOTTLE CRITICAL RESULT CALLED TO, READ BACK BY AND VERIFIED WITH: PHARMD J GADHIA [id number] MLM    Culture (A)  Final    STAPHYLOCOCCUS AUREUS SUSCEPTIBILITIES PERFORMED ON PREVIOUS CULTURE WITHIN THE LAST 5 DAYS. Performed at [facility], [address], [place], [place] [address]    Report Status 05/11/2016 FINAL  Final  Culture, blood (routine x 2)     Status: Abnormal   Collection Time: 05/08/16  5:59 PM  Result Value Ref Range Status   Specimen Description BLOOD LEFT ARM  Final   Special Requests IN PEDIATRIC BOTTLE 1.5CC  Final   Culture  Setup Time   Final    GRAM POSITIVE COCCI IN CLUSTERS AEROBIC BOTTLE ONLY CRITICAL VALUE NOTED.  VALUE IS CONSISTENT WITH PREVIOUSLY REPORTED AND CALLED VALUE.    Culture (A)  Final    STAPHYLOCOCCUS AUREUS SUSCEPTIBILITIES PERFORMED ON PREVIOUS CULTURE WITHIN THE LAST 5 DAYS. Performed at [facility], [address] [place], [place] [address]    Report Status 05/11/2016 FINAL  Final  Culture, blood (Routine X 2) w Reflex to ID Panel     Status: Abnormal   Collection Time: 05/08/16  9:49 PM  Result Value Ref Range Status   Specimen Description BLOOD LEFT HAND  Final   Special Requests IN PEDIATRIC BOTTLE 0.5CC  Final   Culture  Setup Time   Final    GRAM POSITIVE COCCI IN CLUSTERS IN PEDIATRIC BOTTLE CRITICAL RESULT CALLED TO, READ BACK BY AND VERIFIED WITH: BETH GREEN,PHARMD @2339  002/05/18 MKELLY,MLT Performed at [facility], [address] [place], [place] [address]     Culture METHICILLIN RESISTANT STAPHYLOCOCCUS AUREUS (A)  Final   Report Status 05/11/2016 FINAL  Final   Organism ID, Bacteria METHICILLIN RESISTANT STAPHYLOCOCCUS AUREUS  Final      Susceptibility   Methicillin resistant staphylococcus aureus - MIC*    CIPROFLOXACIN <=0.5 SENSITIVE Sensitive     ERYTHROMYCIN >=8 RESISTANT Resistant     GENTAMICIN <=0.5 SENSITIVE Sensitive     OXACILLIN >=4 RESISTANT Resistant     TETRACYCLINE >=16 RESISTANT Resistant     VANCOMYCIN <=0.5 SENSITIVE Sensitive     TRIMETH/SULFA <=10 SENSITIVE Sensitive     CLINDAMYCIN >=8  RESISTANT Resistant     RIFAMPIN <=0.5 SENSITIVE Sensitive     Inducible Clindamycin NEGATIVE Sensitive     * METHICILLIN RESISTANT STAPHYLOCOCCUS AUREUS  Culture, blood (routine x 2)     Status: None (Preliminary result)   Collection Time: 05/10/16 11:18 AM  Result Value Ref Range Status   Specimen Description BLOOD RIGHT ARM  Final   Special Requests IN PEDIATRIC BOTTLE 3CC  Final   Culture  Setup Time   Final    GRAM POSITIVE COCCI IN CLUSTERS IN PEDIATRIC BOTTLE CRITICAL VALUE NOTED.  VALUE IS CONSISTENT WITH PREVIOUSLY REPORTED AND CALLED VALUE. Performed at [facility], [address] [place], [place] [address]    Culture PENDING  Incomplete   Report Status PENDING  Incomplete     Liver Function Tests:  Recent Labs Lab 2016-05-30 2017 05/07/16 0906 05/08/16 0625  AST 12* 14* 14*  ALT 7* 8* 8*  ALKPHOS 103 91 95  BILITOT 1.5* 0.9 0.7  PROT 6.4* 6.2* 7.0  ALBUMIN 2.1* 2.0* 2.0*   No results for input(s): LIPASE, AMYLASE in the last 168 hours. No results for input(s): AMMONIA in the last 168 hours.  Cardiac Enzymes:  Recent Labs Lab May 30, 2016 1554  CKTOTAL 16*   BNP (last 3 results)  Recent Labs  11/16/15 2030 04/03/16 0021  BNP 215.3* 89.9    ProBNP (last 3 results) No results for input(s): PROBNP in the last 8760 hours.    Studies: No results found.  Scheduled Meds: . apixaban  5 mg  Oral BID  . benzonatate  200 mg Oral TID  . chlorhexidine  15 mL Mouth Rinse BID  . feeding supplement (ENSURE ENLIVE)  237 mL Oral BID BM  . mouth rinse  15 mL Mouth Rinse q12n4p  . sodium chloride flush  3 mL Intravenous Q12H  . vancomycin  750 mg Intravenous Q24H      Time spent: 35 min  Eudora Guevarra [facility]   [facility] Pager [phone] If 7PM-7AM, please contact night-coverage at [email or web address], Office  [phone]  password [email or web address] 05/11/2016, 8:30 AM  LOS: 5 days

## 2016-05-11 NOTE — Progress Notes (Signed)
Pharmacy Antibiotic Note  Margretta Dittyngela N Duskin is a 31 y.o. female with hx serratia bacteremia, IVDU, polysubstance abuse presented to the ED on 05/29/2016 with c/o right shoulder pain and generalized pain s/p fall. He was started in vancomycin and cefepime on admission for sepsis and suspected PNA.  Cefepime d/ced on 05/07/16 and currently on vancomycin day #6 for MRSA bacteremia.  She remains febrile and repeat bcx on 05/10/16 with GPC in clusters-- to change vancomycin to daptomycin per ID.  - scr down 1.50 (crcl~38)   Plan: - d/c vancomycin - daptomycin 350 mg IV q24h (~8 mg/kg/day) - check CK with AM labs on 05/12/16 and then weekly  ____________________________________  Height: 5\' 5"  (165.1 cm) Weight: 95 lb 10.9 oz (43.4 kg) IBW/kg (Calculated) : 57  Temp (24hrs), Avg:100.2 F (37.9 C), Min:97.9 F (36.6 C), Max:103 F (39.4 C)   Recent Labs Lab 05/15/2016 1554 05/13/2016 1829 05/07/16 0906 05/08/16 0625 05/09/16 0528 05/09/16 2054 05/10/16 0559 05/11/16 0545  WBC 16.2*  --  12.7* 12.7* 15.3*  --  19.6* 13.2*  CREATININE 2.91*  --  2.78* 2.38* 2.18*  --   --  1.50*  LATICACIDVEN  --  1.66  --   --   --   --   --   --   VANCOTROUGH  --   --   --   --   --  10*  --   --     Estimated Creatinine Clearance: 38.3 mL/min (by C-G formula based on SCr of 1.5 mg/dL (H)).    No Known Allergies  Antimicrobials this admission:  2/3 Vanc >> 2/8 2/3 Cefepime >> 2/4 2/8 dapto>>  Dose adjustments this admission:  2/6 Vancomycin trough level = 10  on Vanc 1g x1 then 500mg  q24h (prior to 4th dose) ==> inc to 750mg  IV q24h  Microbiology results:  2/3 BCx: 1/2 Staph aureus, MRSA + 2/3 UCx: insign growth - final 2/3 HIV Ab: NR 2/4 MRSA PCR: negative 2/4 Respiratory: MSSA (res to Clinda,Ery,TCN) 2/4 AFB smear/culture: neg/ngtd 2/5 BCx x2: MRSA FINAL 2/7 BCx: GPC in clusters   Thank you for allowing pharmacy to be a part of this patient's care.  Lucia Gaskinsham, Shirel Mallis P 05/11/2016 5:16 PM

## 2016-05-11 NOTE — Progress Notes (Signed)
Pt h/r in 130's - 140's after 500cc bolus. Gv PRn Metoprolol. H/R down to 120's - 130's.   Pt also having temp of 103. Gave Tylenol. Pt temp now 102.3.  Paged K. Schorr regarding h/r and temp. Order given for another 500cc bolus and Tylenol 325.  Will continue to monitor. Golden HurterKeishonna Jahki Witham

## 2016-05-11 NOTE — Progress Notes (Signed)
Kirtland BouchardK. Schorr notified of pt's new vital signs. H/r now in low 100's - 110's. Temp now 98.0. Will continue to monitor. Golden HurterKeishonna Emaly Boschert

## 2016-05-12 LAB — BASIC METABOLIC PANEL
ANION GAP: 7 (ref 5–15)
BUN: 27 mg/dL — ABNORMAL HIGH (ref 6–20)
CO2: 18 mmol/L — ABNORMAL LOW (ref 22–32)
Calcium: 7.5 mg/dL — ABNORMAL LOW (ref 8.9–10.3)
Chloride: 108 mmol/L (ref 101–111)
Creatinine, Ser: 1.31 mg/dL — ABNORMAL HIGH (ref 0.44–1.00)
GFR, EST NON AFRICAN AMERICAN: 55 mL/min — AB (ref >60)
Glucose, Bld: 101 mg/dL — ABNORMAL HIGH (ref 65–99)
POTASSIUM: 4.4 mmol/L (ref 3.5–5.1)
SODIUM: 133 mmol/L — AB (ref 135–145)

## 2016-05-12 LAB — GLUCOSE, CAPILLARY: GLUCOSE-CAPILLARY: 114 mg/dL — AB (ref 65–99)

## 2016-05-12 LAB — CBC
HCT: 22.6 % — ABNORMAL LOW (ref 36.0–46.0)
Hemoglobin: 7.2 g/dL — ABNORMAL LOW (ref 12.0–15.0)
MCH: 26.8 pg (ref 26.0–34.0)
MCHC: 31.9 g/dL (ref 30.0–36.0)
MCV: 84 fL (ref 78.0–100.0)
Platelets: 109 10*3/uL — ABNORMAL LOW (ref 150–400)
RBC: 2.69 MIL/uL — ABNORMAL LOW (ref 3.87–5.11)
RDW: 20.2 % — ABNORMAL HIGH (ref 11.5–15.5)
WBC: 14.9 10*3/uL — ABNORMAL HIGH (ref 4.0–10.5)

## 2016-05-12 LAB — CULTURE, BLOOD (ROUTINE X 2)

## 2016-05-12 LAB — CK: CK TOTAL: 7 U/L — AB (ref 38–234)

## 2016-05-12 MED ORDER — ENSURE ENLIVE PO LIQD: 237.0000 mL | Freq: Two times a day (BID) | ORAL | Status: DC

## 2016-05-12 MED ORDER — BOOST / RESOURCE BREEZE PO LIQD
1.0000 | Freq: Two times a day (BID) | ORAL | Status: DC
  Administered 2016-05-12 – 2016-05-16 (×5): 1 via ORAL

## 2016-05-12 MED ORDER — GUAIFENESIN 100 MG/5ML PO SOLN
5.0000 mL | ORAL | Status: DC | PRN
  Administered 2016-05-12 – 2016-05-13 (×4): 100 mg via ORAL
  Filled 2016-05-12: qty 10
  Filled 2016-05-12: qty 50
  Filled 2016-05-12 (×2): qty 10
  Filled 2016-05-12: qty 50

## 2016-05-12 MED ORDER — LEVALBUTEROL HCL 0.63 MG/3ML IN NEBU
0.6300 mg | INHALATION_SOLUTION | Freq: Two times a day (BID) | RESPIRATORY_TRACT | Status: DC
  Administered 2016-05-12 – 2016-05-13 (×2): 0.63 mg via RESPIRATORY_TRACT
  Filled 2016-05-12 (×2): qty 3

## 2016-05-12 MED ORDER — ADULT MULTIVITAMIN W/MINERALS CH
1.0000 | ORAL_TABLET | Freq: Every day | ORAL | Status: DC
  Administered 2016-05-12 – 2016-05-16 (×5): 1 via ORAL
  Filled 2016-05-12 (×6): qty 1

## 2016-05-12 MED ORDER — HYDROCOD POLST-CPM POLST ER 10-8 MG/5ML PO SUER
5.0000 mL | Freq: Once | ORAL | Status: AC
  Administered 2016-05-12: 5 mL via ORAL
  Filled 2016-05-12: qty 5

## 2016-05-12 NOTE — Progress Notes (Signed)
[facility]  Date of Admission:  2016/05/25    Total days of antibiotics 7        Day 1 daptomycin          Principal Problem:   Endocarditis of tricuspid valve Active Problems:   HCAP (healthcare-associated pneumonia)   MRSA bacteremia   Enterococcal bacteremia   Staphylococcus aureus bacteremia   Serratia infection   Polysubstance abuse   IVDU (intravenous drug user)   Opioid use disorder, severe, dependence (HCC)   Hyponatremia   Thrombocytopenia (HCC)   Cavitary lung disease   Poor dentition   Acute kidney injury (HCC)   Protein-calorie malnutrition, severe (HCC)   Symptomatic anemia   Septic embolism (HCC)   Tobacco abuse   History of pulmonary embolism   . apixaban  5 mg Oral BID  . benzonatate  200 mg Oral TID  . chlorhexidine  15 mL Mouth Rinse BID  . DAPTOmycin (CUBICIN)  IV  350 mg Intravenous Q24H  . feeding supplement (ENSURE ENLIVE)  237 mL Oral BID BM  . levalbuterol  0.63 mg Nebulization Q6H  . mouth rinse  15 mL Mouth Rinse q12n4p  . sodium chloride flush  3 mL Intravenous Q12H    SUBJECTIVE: She states that her cough is a little better. She still occasionally brings up some blood-tinged sputum. She was able to eat a little more of her breakfast this morning. She is still having chest and back pain and feeling anxious. She asked why her Ativan was taken away.  Review of Systems: Review of Systems  Constitutional: Positive for malaise/fatigue. Negative for chills, diaphoresis, fever and weight loss.  HENT: Negative for sore throat.   Respiratory: Positive for cough, hemoptysis, sputum production and shortness of breath.   Cardiovascular: Positive for chest pain.  Gastrointestinal: Positive for constipation. Negative for abdominal pain, diarrhea, nausea and vomiting.  Genitourinary: Negative for dysuria.  Musculoskeletal: Positive for back pain.  Skin: Negative for itching and rash.  Neurological: Negative for  weakness.  Psychiatric/Behavioral: Positive for depression and substance abuse. The patient is nervous/anxious.     Past Medical History:  Diagnosis Date  . Anemia   . CAP (community acquired pneumonia) 11/16/2015  . Childhood asthma   . Endocarditis   . Enterococcal bacteremia   . Heroin abuse   . Hyponatremia   . Pneumonia 2007  . Polysubstance abuse     Social History  Substance Use Topics  . Smoking status: Current Every Day Smoker    Packs/day: 0.50    Years: 10.00    Types: Cigarettes  . Smokeless tobacco: Never Used  . Alcohol use No    Family History  Problem Relation Age of Onset  . Anesthesia problems Neg Hx   . Hypotension Neg Hx   . Malignant hyperthermia Neg Hx   . Pseudochol deficiency Neg Hx    No Known Allergies  OBJECTIVE: Vitals:   05/11/16 1951 05/11/16 2118 05/12/16 0451 05/12/16 0759  BP:  127/77 124/83   Pulse: (!) 125 (!) 130 (!) 124   Resp: (!) 22 (!) 24    Temp:  99.6 F (37.6 C) 99.8 F (37.7 C)   TempSrc:  Oral Oral   SpO2: 96% 98% 95% 97%  Weight:   95 lb 0.3 oz (43.1 kg)   Height:       Body mass index is 15.81 kg/m.  Physical Exam  Lab Results Lab Results  Component Value Date   WBC 14.9 (H) 05/12/2016   HGB 7.2 (L) 05/12/2016   HCT 22.6 (L) 05/12/2016   MCV 84.0 05/12/2016   PLT 109 (L) 05/12/2016    Lab Results  Component Value Date   CREATININE 1.31 (H) 05/12/2016   BUN 27 (H) 05/12/2016   NA 133 (L) 05/12/2016   K 4.4 05/12/2016   CL 108 05/12/2016   CO2 18 (L) 05/12/2016    Lab Results  Component Value Date   ALT 8 (L) 05/08/2016   AST 14 (L) 05/08/2016   ALKPHOS 95 05/08/2016   BILITOT 0.7 05/08/2016     Microbiology: Recent Results (from the past 240 hour(s))  Urine culture     Status: Abnormal   Collection Time: 05/27/2016  3:27 PM  Result Value Ref Range Status   Specimen Description URINE, RANDOM  Final   Special Requests NONE  Final   Culture (A)  Final    <10,000 COLONIES/mL INSIGNIFICANT  GROWTH Performed at [facility], [address], [place], [place] [address]    Report Status 05/08/2016 FINAL  Final  Blood culture (routine x 2)     Status: Abnormal   Collection Time: 05/05/2016  5:35 PM  Result Value Ref Range Status   Specimen Description BLOOD LEFT ARM  Final   Special Requests IN PEDIATRIC BOTTLE 2CC  Final   Culture  Setup Time   Final    GRAM POSITIVE COCCI IN CLUSTERS IN PEDIATRIC BOTTLE CRITICAL RESULT CALLED TO, READ BACK BY AND VERIFIED WITH: T. GREEN PHARMD, AT 6045 05/07/16 BY Renato Shin Performed at [facility], [address] [place], [place] [address]    Culture METHICILLIN RESISTANT STAPHYLOCOCCUS AUREUS (A)  Final   Report Status 05/09/2016 FINAL  Final   Organism ID, Bacteria METHICILLIN RESISTANT STAPHYLOCOCCUS AUREUS  Final      Susceptibility   Methicillin resistant staphylococcus aureus - MIC*    CIPROFLOXACIN <=0.5 SENSITIVE Sensitive     ERYTHROMYCIN >=8 RESISTANT Resistant     GENTAMICIN <=0.5 SENSITIVE Sensitive     OXACILLIN >=4 RESISTANT Resistant     TETRACYCLINE >=16 RESISTANT Resistant     VANCOMYCIN 1 SENSITIVE Sensitive     TRIMETH/SULFA <=10 SENSITIVE Sensitive     CLINDAMYCIN >=8 RESISTANT Resistant     RIFAMPIN <=0.5 SENSITIVE Sensitive     Inducible Clindamycin NEGATIVE Sensitive     * METHICILLIN RESISTANT STAPHYLOCOCCUS AUREUS  Blood Culture ID Panel (Reflexed)     Status: Abnormal   Collection Time: 05/14/2016  5:35 PM  Result Value Ref Range Status   Enterococcus species NOT DETECTED NOT DETECTED Final   Listeria monocytogenes NOT DETECTED NOT DETECTED Final   Staphylococcus species DETECTED (A) NOT DETECTED Final    Comment: CRITICAL RESULT CALLED TO, READ BACK BY AND VERIFIED WITH: T. GREEN PHARMD, AT 1914 05/07/16 BY D. VANHOOK    Staphylococcus aureus DETECTED (A) NOT DETECTED Final    Comment: Methicillin (oxacillin)-resistant Staphylococcus aureus (MRSA). MRSA is predictably resistant to beta-lactam  antibiotics (except ceftaroline). Preferred therapy is vancomycin unless clinically contraindicated. Patient requires contact precautions if  hospitalized. CRITICAL RESULT CALLED TO, READ BACK BY AND VERIFIED WITH: T. GREEN PHARMD, AT 7829 05/07/16 BY D. VANHOOK    Methicillin resistance DETECTED (A) NOT DETECTED Final    Comment: CRITICAL RESULT CALLED TO, READ BACK BY AND VERIFIED WITH: T. GREEN PHARMD, AT 5621 05/07/16 BY D. VANHOOK    Streptococcus species NOT DETECTED NOT  DETECTED Final   Streptococcus agalactiae NOT DETECTED NOT DETECTED Final   Streptococcus pneumoniae NOT DETECTED NOT DETECTED Final   Streptococcus pyogenes NOT DETECTED NOT DETECTED Final   Acinetobacter baumannii NOT DETECTED NOT DETECTED Final   Enterobacteriaceae species NOT DETECTED NOT DETECTED Final   Enterobacter cloacae complex NOT DETECTED NOT DETECTED Final   Escherichia coli NOT DETECTED NOT DETECTED Final   Klebsiella oxytoca NOT DETECTED NOT DETECTED Final   Klebsiella pneumoniae NOT DETECTED NOT DETECTED Final   Proteus species NOT DETECTED NOT DETECTED Final   Serratia marcescens NOT DETECTED NOT DETECTED Final   Haemophilus influenzae NOT DETECTED NOT DETECTED Final   Neisseria meningitidis NOT DETECTED NOT DETECTED Final   Pseudomonas aeruginosa NOT DETECTED NOT DETECTED Final   Candida albicans NOT DETECTED NOT DETECTED Final   Candida glabrata NOT DETECTED NOT DETECTED Final   Candida krusei NOT DETECTED NOT DETECTED Final   Candida parapsilosis NOT DETECTED NOT DETECTED Final   Candida tropicalis NOT DETECTED NOT DETECTED Final    Comment: Performed at [facility], [address] [place], [place] [address]  Blood culture (routine x 2)     Status: None   Collection Time: 05/27/2016  6:18 PM  Result Value Ref Range Status   Specimen Description BLOOD RIGHT NECK  Final   Special Requests IN PEDIATRIC BOTTLE 2CC  Final   Culture   Final    NO GROWTH 5 DAYS Performed at [facility], [address], [place], [place] [address]    Report Status 05/11/2016 FINAL  Final  MRSA PCR Screening     Status: None   Collection Time: 05/07/16 12:57 AM  Result Value Ref Range Status   MRSA by PCR NEGATIVE NEGATIVE Final    Comment:        The GeneXpert MRSA Assay (FDA approved for NASAL specimens only), is one component of a comprehensive MRSA colonization surveillance program. It is not intended to diagnose MRSA infection nor to guide or monitor treatment for MRSA infections.   Acid Fast Smear (AFB)     Status: None   Collection Time: 05/07/16  1:20 PM  Result Value Ref Range Status   AFB Specimen Processing Concentration  Final   Acid Fast Smear Negative  Final    Comment: (NOTE) Performed At: [facility] [address] [place], [place] [id number] Mila Homer MD [id number]    Source (AFB) SPUTUM  Final  Culture, sputum-assessment     Status: None   Collection Time: 05/07/16  1:22 PM  Result Value Ref Range Status   Specimen Description SPUTUM  Final   Special Requests Immunocompromised  Final   Sputum evaluation THIS SPECIMEN IS ACCEPTABLE FOR SPUTUM CULTURE  Final   Report Status 05/07/2016 FINAL  Final  Culture, respiratory (NON-Expectorated)     Status: None   Collection Time: 05/07/16  1:22 PM  Result Value Ref Range Status   Specimen Description SPUTUM  Final   Special Requests Immunocompromised Reflexed from [id number]  Final   Gram Stain   Final    ABUNDANT WBC PRESENT, PREDOMINANTLY PMN MODERATE YEAST MODERATE GRAM POSITIVE RODS Performed at [facility], [address] [place], [place] [address]    Culture MODERATE STAPHYLOCOCCUS AUREUS  Final   Report Status 05/10/2016 FINAL  Final   Organism ID, Bacteria STAPHYLOCOCCUS AUREUS  Final      Susceptibility   Staphylococcus aureus - MIC*    CIPROFLOXACIN <=0.5 SENSITIVE Sensitive  ERYTHROMYCIN >=8 RESISTANT Resistant     GENTAMICIN <=0.5 SENSITIVE Sensitive      OXACILLIN 0.5 SENSITIVE Sensitive     TETRACYCLINE >=16 RESISTANT Resistant     VANCOMYCIN 1 SENSITIVE Sensitive     TRIMETH/SULFA <=10 SENSITIVE Sensitive     CLINDAMYCIN >=8 RESISTANT Resistant     RIFAMPIN <=0.5 SENSITIVE Sensitive     Inducible Clindamycin NEGATIVE Sensitive     * MODERATE STAPHYLOCOCCUS AUREUS  Culture, blood (Routine X 2) w Reflex to ID Panel     Status: Abnormal   Collection Time: 05/08/16  6:25 AM  Result Value Ref Range Status   Specimen Description BLOOD LEFT ARM  Final   Special Requests IN PEDIATRIC BOTTLE 2CC  Final   Culture  Setup Time   Final    GRAM POSITIVE COCCI IN CLUSTERS AEROBIC BOTTLE ONLY CRITICAL VALUE NOTED.  VALUE IS CONSISTENT WITH PREVIOUSLY REPORTED AND CALLED VALUE.    Culture (A)  Final    STAPHYLOCOCCUS AUREUS SUSCEPTIBILITIES PERFORMED ON PREVIOUS CULTURE WITHIN THE LAST 5 DAYS. Performed at [facility], [address], [place], [place] [address]    Report Status 05/11/2016 FINAL  Final  Culture, blood (routine x 2)     Status: Abnormal   Collection Time: 05/08/16  5:59 PM  Result Value Ref Range Status   Specimen Description BLOOD LEFT HAND  Final   Special Requests IN PEDIATRIC BOTTLE 1 CC  Final   Culture  Setup Time   Final    GRAM POSITIVE COCCI IN CLUSTERS IN PEDIATRIC BOTTLE CRITICAL RESULT CALLED TO, READ BACK BY AND VERIFIED WITH: PHARMD J GADHIA [id number] MLM    Culture (A)  Final    STAPHYLOCOCCUS AUREUS SUSCEPTIBILITIES PERFORMED ON PREVIOUS CULTURE WITHIN THE LAST 5 DAYS. Performed at [facility], [address] [place], [place] [address]    Report Status 05/11/2016 FINAL  Final  Culture, blood (routine x 2)     Status: Abnormal   Collection Time: 05/08/16  5:59 PM  Result Value Ref Range Status   Specimen Description BLOOD LEFT ARM  Final   Special Requests IN PEDIATRIC BOTTLE 1.5CC  Final   Culture  Setup Time   Final    GRAM POSITIVE COCCI IN CLUSTERS AEROBIC BOTTLE ONLY CRITICAL  VALUE NOTED.  VALUE IS CONSISTENT WITH PREVIOUSLY REPORTED AND CALLED VALUE.    Culture (A)  Final    STAPHYLOCOCCUS AUREUS SUSCEPTIBILITIES PERFORMED ON PREVIOUS CULTURE WITHIN THE LAST 5 DAYS. Performed at [facility], [address] [place], [place] [address]    Report Status 05/11/2016 FINAL  Final  Culture, blood (Routine X 2) w Reflex to ID Panel     Status: Abnormal   Collection Time: 05/08/16  9:49 PM  Result Value Ref Range Status   Specimen Description BLOOD LEFT HAND  Final   Special Requests IN PEDIATRIC BOTTLE 0.5CC  Final   Culture  Setup Time   Final    GRAM POSITIVE COCCI IN CLUSTERS IN PEDIATRIC BOTTLE CRITICAL RESULT CALLED TO, READ BACK BY AND VERIFIED WITH: BETH GREEN,PHARMD @2339  002/05/18 MKELLY,MLT Performed at [facility], [address], [place], [place] [address]    Culture METHICILLIN RESISTANT STAPHYLOCOCCUS AUREUS (A)  Final   Report Status 05/11/2016 FINAL  Final   Organism ID, Bacteria METHICILLIN RESISTANT STAPHYLOCOCCUS AUREUS  Final      Susceptibility   Methicillin resistant staphylococcus aureus - MIC*    CIPROFLOXACIN <=0.5  SENSITIVE Sensitive     ERYTHROMYCIN >=8 RESISTANT Resistant     GENTAMICIN <=0.5 SENSITIVE Sensitive     OXACILLIN >=4 RESISTANT Resistant     TETRACYCLINE >=16 RESISTANT Resistant     VANCOMYCIN <=0.5 SENSITIVE Sensitive     TRIMETH/SULFA <=10 SENSITIVE Sensitive     CLINDAMYCIN >=8 RESISTANT Resistant     RIFAMPIN <=0.5 SENSITIVE Sensitive     Inducible Clindamycin NEGATIVE Sensitive     * METHICILLIN RESISTANT STAPHYLOCOCCUS AUREUS  Acid Fast Smear (AFB)     Status: None   Collection Time: 05/10/16  6:09 AM  Result Value Ref Range Status   AFB Specimen Processing Concentration  Final   Acid Fast Smear Negative  Final    Comment: (NOTE) Performed At: [facility] [address] [place], [place] [id number] Mila HomerHancock William F MD WU:[phone]    Source (AFB) SPUTUM  Final  Culture, blood  (routine x 2)     Status: None (Preliminary result)   Collection Time: 05/10/16 11:18 AM  Result Value Ref Range Status   Specimen Description BLOOD RIGHT ARM  Final   Special Requests IN PEDIATRIC BOTTLE 3CC  Final   Culture  Setup Time   Final    GRAM POSITIVE COCCI IN CLUSTERS IN PEDIATRIC BOTTLE CRITICAL VALUE NOTED.  VALUE IS CONSISTENT WITH PREVIOUSLY REPORTED AND CALLED VALUE.    Culture   Final    NO GROWTH < 24 HOURS Performed at [facility], [address] [place], [place] [address]    Report Status PENDING  Incomplete     ASSESSMENT: She has not had any further fever overnight. I'm hopeful that his switched to high-dose daptomycin will bring her MRSA bacteremia, tricuspid valve endocarditis and pneumonia related to septic pulmonary emboli under better control. I do not see any other reason for her recent fever. If IV access becomes a problem I would consider having a PICC placed while she is here in the hospital to facilitate continue IV antibiotic therapy.  Her severe relapsing infection remains potentially curable. The most difficult hurdle here is how her relative inability to effectively treat her chronic addiction and substance use. If we were able to manage her addiction more effectively I feel that she would be much more likely to cooperate with our medical management plans.  PLAN: 1. Continue daptomycin 2. Repeat blood cultures in a.m. 3. Please call me for any infectious disease questions this weekend  Cliffton AstersJohn Treyten Monestime, MD [facility] [phone] pager   [phone] 05/12/2016, 9:24 AM

## 2016-05-12 NOTE — Plan of Care (Signed)
Problem: Activity: Goal: Risk for activity intolerance will decrease Outcome: Not Progressing Pt increased SOB and pain with any exertion.

## 2016-05-12 NOTE — Progress Notes (Signed)
Nutrition Follow-up  DOCUMENTATION CODES:   Severe malnutrition in context of chronic illness, Underweight  INTERVENTION:  - Continue Ensure Enlive BID. - Will order Boost Breeze BID, each supplement provides 250 kcal and 9 grams of protein - Continue to encourage PO intakes of meals and supplements.  NUTRITION DIAGNOSIS:   Malnutrition related to chronic illness as evidenced by severe depletion of body fat, severe depletion of muscle mass. -ongoing  GOAL:   Patient will meet greater than or equal to 90% of their needs -met the past 2-3 days.  MONITOR:   PO intake, Supplement acceptance, Labs, Weight trends, I & O's  ASSESSMENT:   31 y.o.femalewith medical history significant for IV drug abuse, endocarditis involving tricuspid valve, and nonadherence with her treatment plan, now presenting to the emergency department with generalized aches and pains, malaise, productive cough, and fatigue.   2/9 Per chart review, pt has been accepting Ensure supplements ~50% of the time. She consumed 75% of breakfast and 100% of lunch and dinner yesterday. She also ate 100% of breakfast this AM which consisted of : scrambled eggs, angel food cake, Frosted flakes, breakfast potatoes, and a Coke. Pt likes sweet items; will add Boost Breeze to provide more protein between meals. Weight has been stable since admission. Dr. Gaynelle Adu note from this AM states pt with poor prognosis.   Medications reviewed. Labs reviewed; CBG: 114 mg/dL this AM, Na: 133 mmol/L, BUN: 27 mg/dL, creatinine: 1.31 mg/dL, Ca: 7.5 mg/dL, GFR: 55 mL/min.    2/5 - Patient currently refusing meals and supplements.  - Pt with history of IVDU (cocaine, opiates, benzos) and severe malnutrition.  - Pt with history of homelessness.  - Pt's weight continues to stay around 95 lb.  - Pt has been ordered Ensure supplements. - NFPE consistent with previous admissions, severe muscle and fat depletion.   Diet Order:  Diet regular Room  service appropriate? Yes; Fluid consistency: Thin  Skin:  Reviewed, no issues  Last BM:  2/5  Height:   Ht Readings from Last 1 Encounters:  05/30/2016 '5\' 5"'$  (1.651 m)    Weight:   Wt Readings from Last 1 Encounters:  05/12/16 95 lb 0.3 oz (43.1 kg)    Ideal Body Weight:  56.8 kg  BMI:  Body mass index is 15.81 kg/m.  Estimated Nutritional Needs:   Kcal:  1500-1700  Protein:  80-90g  Fluid:  1.5-1.7L/day  EDUCATION NEEDS:   No education needs identified at this time    Jarome Matin, MS, RD, LDN, CNSC Inpatient Clinical Dietitian Pager # [phone] After hours/weekend pager # [phone]

## 2016-05-12 NOTE — Progress Notes (Signed)
[facility]  PROGRESS NOTE  Catherine Hernandez ZOX:[id number] DOB: [birth year] DOA: 05/04/2016 PCP: No PCP Per Patient   Brief HPI:    31 y.o. female with medical history significant for IV drug abuse, endocarditis involving tricuspid valve, and nonadherence with her treatment plan, now presenting to the emergency department with generalized aches and pains, malaise, productive cough, and fatigue.   She has had a complicated recent medical course involving hospitalization in December 2017 with Serratia bacteremia, bilateral pulmonary emboli, and tricuspid valve endocarditis. She was evaluated by CT surgery at that time and deemed to not be a surgical candidate.She was treated with antibiotics in the hospital and discharged to complete a course of Cipro. She returned to the hospital with the starting of January 2018 with sepsis secondary to HCAP and noted to have cavitary pulmonary lesions. She was treated with empiric vancomycin and cefepime in the hospital and per ID consultant, she was not a PICC candidate and was discharged with 1 month of Bactrim which she never filled patient came back to the hospital and found to be afebrile chest x-ray showed patchy bilateral air space opacities likely representing multifocal pneumonia #2 septic emboli or asymmetric edema also possibility.. CT of the cervical spine notable for opacities and nodules in the lung apices with cavitation Blood Cx 1/2 with MRSA now  Subjective  Continues to have cough   Assessment/Plan:   Sepsis/MRSA bacteremia- blood cultures in 1/2 bottles  With MRSA from 2/3 -repeat cultures pending -ID consulting- abx changed: non complaint with recent Serratia endocarditis of tricuspid valve -2D ECHO - worsening of her vegetation.  -cont abx -Palliative care team re-consulted- very poor prognosis  Recent TV endocarditis with Severe TR, multifocal pneumonia/septic embolization -treated with IV abx -seen by CVTS, not a surgical  candidate and Palliative consult obtained last admission, medical rx pursued but didn't fill script for bactrim she was discharged on in January -Patient was not a PICC line candidate at recent discharge due to ongoing IV drug abuse -suspect progression of disease -ID following -worsening of her vegetation on Echo performed on 05/08/16  Multifocal pneumonia/Septic embolization from IE -with evolution to cavitary lung disease - ID following- abx changed to daptomycin due to increased septic emboli in lungs -FU sputum Cx -clinically do not suspect TB, will FU AFB smears   Acute kidney injury- patient baseline creatinine 0.6 in January 2018, admitted with creatinine of 2.91. - creatinine down -no hydronephrosis on Renal US  Hypokalemia- -replaced  Thrombocytopenia- -Stop heparin and resume Eliquis she was on previously for h/o PE  History of pulmonary embolism -- patient was diagnosed with bilateral PEs in December 2017 and has been on eliquis, since creatinine improving will ask PharmD to transition back to gfr adjusted dose of Eliquis  Polysubstance abuse- patient has ongoing IV drug abuse. UDS positive for cocaine, opiates, benzodiazepines. Social work consultation requested.  Anemia- unknown source and etiology -will check occult stool -transfuse for < 7  Hyponatremia -BMP in AM -improving   DVT prophylaxis: Eliquis Code Status: Full code Family Communication: No family present at bedside  Disposition Plan:  Her prognosis remains poor  Consultants:  ID  Palliative care   Antibiotics:   Anti-infectives    Start     Dose/Rate Route Frequency Ordered Stop   05/11/16 1800  DAPTOmycin (CUBICIN) 350 mg in sodium chloride 0.9 % IVPB     350 mg 214 mL/hr over 30 Minutes Intravenous Every 24 hours 05/11/16 1730  05/09/16 2200  vancomycin (VANCOCIN) IVPB 750 mg/150 ml premix  Status:  Discontinued     750 mg 150 mL/hr over 60 Minutes Intravenous Every 24 hours  05/09/16 2143 05/11/16 1708   05/07/16 2000  vancomycin (VANCOCIN) 500 mg in sodium chloride 0.9 % 100 mL IVPB  Status:  Discontinued     500 mg 100 mL/hr over 60 Minutes Intravenous Every 24 hours 2016/06/03 1824 05/09/16 2143   05/07/16 2000  ceFEPIme (MAXIPIME) 1 g in dextrose 5 % 50 mL IVPB  Status:  Discontinued     1 g 100 mL/hr over 30 Minutes Intravenous Every 24 hours 06-03-16 1824 05/07/16 1546   Jun 03, 2016 1800  ceFEPIme (MAXIPIME) 2 g in dextrose 5 % 50 mL IVPB     2 g 100 mL/hr over 30 Minutes Intravenous  Once 06-03-16 1754 03-Jun-2016 1944   2016-06-03 1800  vancomycin (VANCOCIN) IVPB 1000 mg/200 mL premix     1,000 mg 200 mL/hr over 60 Minutes Intravenous  Once 2016/06/03 1754 2016-06-03 2030       Objective   Vitals:   05/11/16 1951 05/11/16 2118 05/12/16 0451 05/12/16 0759  BP:  127/77 124/83   Pulse: (!) 125 (!) 130 (!) 124   Resp: (!) 22 (!) 24    Temp:  99.6 F (37.6 C) 99.8 F (37.7 C)   TempSrc:  Oral Oral   SpO2: 96% 98% 95% 97%  Weight:   43.1 kg (95 lb 0.3 oz)   Height:        Intake/Output Summary (Last 24 hours) at 05/12/16 0805 Last data filed at 05/12/16 0724  Gross per 24 hour  Intake             1307 ml  Output              600 ml  Net              707 ml   Filed Weights   05/10/16 0555 05/11/16 0351 05/12/16 0451  Weight: 42.3 kg (93 lb 4.1 oz) 43.4 kg (95 lb 10.9 oz) 43.1 kg (95 lb 0.3 oz)     Physical Examination:  General exam: thinly built, cachectic- poor dentation Respiratory system: rhonci slightly improved Cardiovascular system:  RRR, systolic murmur noted, tachycardic GI system: Abdomen is nondistended, positive tenderness in both right and left upper quadrant. No organomegaly.  Skin: Excoriations noted in the extremities and trunk..    Data Reviewed: I have personally reviewed following labs and imaging studies  CBG:  Recent Labs Lab 05/08/16 0758 05/09/16 0726 05/10/16 0731 05/11/16 0757 05/12/16 0733  GLUCAP 92 79 99  119* 114*    CBC:  Recent Labs Lab 06/03/16 1554  05/07/16 0906 05/08/16 0625 05/09/16 0528 05/10/16 0559 05/11/16 0545 05/12/16 0540  WBC 16.2*  --  12.7* 12.7* 15.3* 19.6* 13.2* 14.9*  NEUTROABS 13.9*  --  10.0*  --   --   --   --   --   HGB 12.1  --  10.1* 11.7* 9.4* 8.7* 7.9* 7.2*  HCT 34.9*  --  29.4* 35.3* 28.9* 27.3* 24.2* 22.6*  MCV 78.8  --  79.9 81.7 83.5 82.0 83.2 84.0  PLT 81*  < > 73* 73* 97* 107* 93* 109*  < > = values in this interval not displayed.  Basic Metabolic Panel:  Recent Labs Lab June 03, 2016 1554 05/07/16 0906 05/08/16 0625 05/09/16 0528 05/11/16 0545 05/12/16 0540  NA 127* 129* 130* 132*  --  133*  K 3.4* 3.2* 4.1 4.2  --  4.4  CL 94* 101 103 105  --  108  CO2 17* 18* 18* 16*  --  18*  GLUCOSE 119* 168* 96 97  --  101*  BUN 64* 62* 53* 46*  --  27*  CREATININE 2.91* 2.78* 2.38* 2.18* 1.50* 1.31*  CALCIUM 7.8* 7.3* 7.6* 7.4*  --  7.5*  MG  --  2.2  --   --   --   --     Recent Results (from the past 240 hour(s))  Urine culture     Status: Abnormal   Collection Time: May 07, 2016  3:27 PM  Result Value Ref Range Status   Specimen Description URINE, RANDOM  Final   Special Requests NONE  Final   Culture (A)  Final    <10,000 COLONIES/mL INSIGNIFICANT GROWTH Performed at [facility], [address], [place], [place] [address]    Report Status 05/08/2016 FINAL  Final  Blood culture (routine x 2)     Status: Abnormal   Collection Time: 07-May-2016  5:35 PM  Result Value Ref Range Status   Specimen Description BLOOD LEFT ARM  Final   Special Requests IN PEDIATRIC BOTTLE 2CC  Final   Culture  Setup Time   Final    GRAM POSITIVE COCCI IN CLUSTERS IN PEDIATRIC BOTTLE CRITICAL RESULT CALLED TO, READ BACK BY AND VERIFIED WITH: T. GREEN PHARMD, AT 1914 05/07/16 BY Renato Shin Performed at [facility], [address], [place], [place] [address]    Culture METHICILLIN RESISTANT STAPHYLOCOCCUS AUREUS (A)  Final   Report Status 05/09/2016  FINAL  Final   Organism ID, Bacteria METHICILLIN RESISTANT STAPHYLOCOCCUS AUREUS  Final      Susceptibility   Methicillin resistant staphylococcus aureus - MIC*    CIPROFLOXACIN <=0.5 SENSITIVE Sensitive     ERYTHROMYCIN >=8 RESISTANT Resistant     GENTAMICIN <=0.5 SENSITIVE Sensitive     OXACILLIN >=4 RESISTANT Resistant     TETRACYCLINE >=16 RESISTANT Resistant     VANCOMYCIN 1 SENSITIVE Sensitive     TRIMETH/SULFA <=10 SENSITIVE Sensitive     CLINDAMYCIN >=8 RESISTANT Resistant     RIFAMPIN <=0.5 SENSITIVE Sensitive     Inducible Clindamycin NEGATIVE Sensitive     * METHICILLIN RESISTANT STAPHYLOCOCCUS AUREUS  Blood Culture ID Panel (Reflexed)     Status: Abnormal   Collection Time: 07-May-2016  5:35 PM  Result Value Ref Range Status   Enterococcus species NOT DETECTED NOT DETECTED Final   Listeria monocytogenes NOT DETECTED NOT DETECTED Final   Staphylococcus species DETECTED (A) NOT DETECTED Final    Comment: CRITICAL RESULT CALLED TO, READ BACK BY AND VERIFIED WITH: T. GREEN PHARMD, AT 6213 05/07/16 BY D. VANHOOK    Staphylococcus aureus DETECTED (A) NOT DETECTED Final    Comment: Methicillin (oxacillin)-resistant Staphylococcus aureus (MRSA). MRSA is predictably resistant to beta-lactam antibiotics (except ceftaroline). Preferred therapy is vancomycin unless clinically contraindicated. Patient requires contact precautions if  hospitalized. CRITICAL RESULT CALLED TO, READ BACK BY AND VERIFIED WITH: T. GREEN PHARMD, AT 0865 05/07/16 BY D. VANHOOK    Methicillin resistance DETECTED (A) NOT DETECTED Final    Comment: CRITICAL RESULT CALLED TO, READ BACK BY AND VERIFIED WITH: T. GREEN PHARMD, AT 7846 05/07/16 BY D. VANHOOK    Streptococcus species NOT DETECTED NOT DETECTED Final   Streptococcus agalactiae NOT DETECTED NOT DETECTED Final   Streptococcus pneumoniae NOT DETECTED NOT DETECTED Final   Streptococcus pyogenes  NOT DETECTED NOT DETECTED Final   Acinetobacter baumannii NOT  DETECTED NOT DETECTED Final   Enterobacteriaceae species NOT DETECTED NOT DETECTED Final   Enterobacter cloacae complex NOT DETECTED NOT DETECTED Final   Escherichia coli NOT DETECTED NOT DETECTED Final   Klebsiella oxytoca NOT DETECTED NOT DETECTED Final   Klebsiella pneumoniae NOT DETECTED NOT DETECTED Final   Proteus species NOT DETECTED NOT DETECTED Final   Serratia marcescens NOT DETECTED NOT DETECTED Final   Haemophilus influenzae NOT DETECTED NOT DETECTED Final   Neisseria meningitidis NOT DETECTED NOT DETECTED Final   Pseudomonas aeruginosa NOT DETECTED NOT DETECTED Final   Candida albicans NOT DETECTED NOT DETECTED Final   Candida glabrata NOT DETECTED NOT DETECTED Final   Candida krusei NOT DETECTED NOT DETECTED Final   Candida parapsilosis NOT DETECTED NOT DETECTED Final   Candida tropicalis NOT DETECTED NOT DETECTED Final    Comment: Performed at [facility], [address] [place], [place] [address]  Blood culture (routine x 2)     Status: None   Collection Time: 2016/05/11  6:18 PM  Result Value Ref Range Status   Specimen Description BLOOD RIGHT NECK  Final   Special Requests IN PEDIATRIC BOTTLE 2CC  Final   Culture   Final    NO GROWTH 5 DAYS Performed at [facility], [address] [place], [place] [address]    Report Status 05/11/2016 FINAL  Final  MRSA PCR Screening     Status: None   Collection Time: 05/07/16 12:57 AM  Result Value Ref Range Status   MRSA by PCR NEGATIVE NEGATIVE Final    Comment:        The GeneXpert MRSA Assay (FDA approved for NASAL specimens only), is one component of a comprehensive MRSA colonization surveillance program. It is not intended to diagnose MRSA infection nor to guide or monitor treatment for MRSA infections.   Acid Fast Smear (AFB)     Status: None   Collection Time: 05/07/16  1:20 PM  Result Value Ref Range Status   AFB Specimen Processing Concentration  Final   Acid Fast Smear Negative  Final     Comment: (NOTE) Performed At: [facility] [address] [place], [place] [id number] Mila Homer MD WG:[id number]    Source (AFB) SPUTUM  Final  Culture, sputum-assessment     Status: None   Collection Time: 05/07/16  1:22 PM  Result Value Ref Range Status   Specimen Description SPUTUM  Final   Special Requests Immunocompromised  Final   Sputum evaluation THIS SPECIMEN IS ACCEPTABLE FOR SPUTUM CULTURE  Final   Report Status 05/07/2016 FINAL  Final  Culture, respiratory (NON-Expectorated)     Status: None   Collection Time: 05/07/16  1:22 PM  Result Value Ref Range Status   Specimen Description SPUTUM  Final   Special Requests Immunocompromised Reflexed from [id number]  Final   Gram Stain   Final    ABUNDANT WBC PRESENT, PREDOMINANTLY PMN MODERATE YEAST MODERATE GRAM POSITIVE RODS Performed at [facility], [address], [place], [place] [address]    Culture MODERATE STAPHYLOCOCCUS AUREUS  Final   Report Status 05/10/2016 FINAL  Final   Organism ID, Bacteria STAPHYLOCOCCUS AUREUS  Final      Susceptibility   Staphylococcus aureus - MIC*    CIPROFLOXACIN <=0.5 SENSITIVE Sensitive     ERYTHROMYCIN >=8 RESISTANT Resistant     GENTAMICIN <=0.5 SENSITIVE Sensitive     OXACILLIN 0.5 SENSITIVE  Sensitive     TETRACYCLINE >=16 RESISTANT Resistant     VANCOMYCIN 1 SENSITIVE Sensitive     TRIMETH/SULFA <=10 SENSITIVE Sensitive     CLINDAMYCIN >=8 RESISTANT Resistant     RIFAMPIN <=0.5 SENSITIVE Sensitive     Inducible Clindamycin NEGATIVE Sensitive     * MODERATE STAPHYLOCOCCUS AUREUS  Culture, blood (Routine X 2) w Reflex to ID Panel     Status: Abnormal   Collection Time: 05/08/16  6:25 AM  Result Value Ref Range Status   Specimen Description BLOOD LEFT ARM  Final   Special Requests IN PEDIATRIC BOTTLE 2CC  Final   Culture  Setup Time   Final    GRAM POSITIVE COCCI IN CLUSTERS AEROBIC BOTTLE ONLY CRITICAL VALUE NOTED.  VALUE IS CONSISTENT WITH PREVIOUSLY  REPORTED AND CALLED VALUE.    Culture (A)  Final    STAPHYLOCOCCUS AUREUS SUSCEPTIBILITIES PERFORMED ON PREVIOUS CULTURE WITHIN THE LAST 5 DAYS. Performed at [facility], [address] [place] [address], [place], [place] [address]    Report Status 05/11/2016 FINAL  Final  Culture, blood (routine x 2)     Status: Abnormal   Collection Time: 05/08/16  5:59 PM  Result Value Ref Range Status   Specimen Description BLOOD LEFT HAND  Final   Special Requests IN PEDIATRIC BOTTLE 1 CC  Final   Culture  Setup Time   Final    GRAM POSITIVE COCCI IN CLUSTERS IN PEDIATRIC BOTTLE CRITICAL RESULT CALLED TO, READ BACK BY AND VERIFIED WITH: PHARMD J GADHIA [id number] MLM    Culture (A)  Final    STAPHYLOCOCCUS AUREUS SUSCEPTIBILITIES PERFORMED ON PREVIOUS CULTURE WITHIN THE LAST 5 DAYS. Performed at [facility], [address], [place], [place] [address]    Report Status 05/11/2016 FINAL  Final  Culture, blood (routine x 2)     Status: Abnormal   Collection Time: 05/08/16  5:59 PM  Result Value Ref Range Status   Specimen Description BLOOD LEFT ARM  Final   Special Requests IN PEDIATRIC BOTTLE 1.5CC  Final   Culture  Setup Time   Final    GRAM POSITIVE COCCI IN CLUSTERS AEROBIC BOTTLE ONLY CRITICAL VALUE NOTED.  VALUE IS CONSISTENT WITH PREVIOUSLY REPORTED AND CALLED VALUE.    Culture (A)  Final    STAPHYLOCOCCUS AUREUS SUSCEPTIBILITIES PERFORMED ON PREVIOUS CULTURE WITHIN THE LAST 5 DAYS. Performed at [facility], [address] [place], [place] [address]    Report Status 05/11/2016 FINAL  Final  Culture, blood (Routine X 2) w Reflex to ID Panel     Status: Abnormal   Collection Time: 05/08/16  9:49 PM  Result Value Ref Range Status   Specimen Description BLOOD LEFT HAND  Final   Special Requests IN PEDIATRIC BOTTLE 0.5CC  Final   Culture  Setup Time   Final    GRAM POSITIVE COCCI IN CLUSTERS IN PEDIATRIC BOTTLE CRITICAL RESULT CALLED TO, READ BACK BY AND VERIFIED WITH: BETH  GREEN,PHARMD @2339  002/05/18 MKELLY,MLT Performed at [facility], [address], [place], [place] [address]    Culture METHICILLIN RESISTANT STAPHYLOCOCCUS AUREUS (A)  Final   Report Status 05/11/2016 FINAL  Final   Organism ID, Bacteria METHICILLIN RESISTANT STAPHYLOCOCCUS AUREUS  Final      Susceptibility   Methicillin resistant staphylococcus aureus - MIC*    CIPROFLOXACIN <=0.5 SENSITIVE Sensitive     ERYTHROMYCIN >=8 RESISTANT Resistant     GENTAMICIN <=0.5 SENSITIVE Sensitive  OXACILLIN >=4 RESISTANT Resistant     TETRACYCLINE >=16 RESISTANT Resistant     VANCOMYCIN <=0.5 SENSITIVE Sensitive     TRIMETH/SULFA <=10 SENSITIVE Sensitive     CLINDAMYCIN >=8 RESISTANT Resistant     RIFAMPIN <=0.5 SENSITIVE Sensitive     Inducible Clindamycin NEGATIVE Sensitive     * METHICILLIN RESISTANT STAPHYLOCOCCUS AUREUS  Acid Fast Smear (AFB)     Status: None   Collection Time: 05/10/16  6:09 AM  Result Value Ref Range Status   AFB Specimen Processing Concentration  Final   Acid Fast Smear Negative  Final    Comment: (NOTE) Performed At: [facility] [address] [place], [place] [id number] Mila Homer MD WU:[id number]    Source (AFB) SPUTUM  Final  Culture, blood (routine x 2)     Status: None (Preliminary result)   Collection Time: 05/10/16 11:18 AM  Result Value Ref Range Status   Specimen Description BLOOD RIGHT ARM  Final   Special Requests IN PEDIATRIC BOTTLE 3CC  Final   Culture  Setup Time   Final    GRAM POSITIVE COCCI IN CLUSTERS IN PEDIATRIC BOTTLE CRITICAL VALUE NOTED.  VALUE IS CONSISTENT WITH PREVIOUSLY REPORTED AND CALLED VALUE.    Culture   Final    NO GROWTH < 24 HOURS Performed at [facility], [address] [place], [place] [address]    Report Status PENDING  Incomplete     Liver Function Tests:  Recent Labs Lab 05/13/2016 2017 05/07/16 0906 05/08/16 0625  AST 12* 14* 14*  ALT 7* 8* 8*  ALKPHOS 103 91 95  BILITOT 1.5*  0.9 0.7  PROT 6.4* 6.2* 7.0  ALBUMIN 2.1* 2.0* 2.0*   No results for input(s): LIPASE, AMYLASE in the last 168 hours. No results for input(s): AMMONIA in the last 168 hours.  Cardiac Enzymes:  Recent Labs Lab 05/12/2016 1554 05/12/16 0540  CKTOTAL 16* 7*   BNP (last 3 results)  Recent Labs  11/16/15 2030 04/03/16 0021  BNP 215.3* 89.9    ProBNP (last 3 results) No results for input(s): PROBNP in the last 8760 hours.    Studies: No results found.  Scheduled Meds: . apixaban  5 mg Oral BID  . benzonatate  200 mg Oral TID  . chlorhexidine  15 mL Mouth Rinse BID  . DAPTOmycin (CUBICIN)  IV  350 mg Intravenous Q24H  . feeding supplement (ENSURE ENLIVE)  237 mL Oral BID BM  . levalbuterol  0.63 mg Nebulization Q6H  . mouth rinse  15 mL Mouth Rinse q12n4p  . sodium chloride flush  3 mL Intravenous Q12H      Time spent: 35 min  Kellianne Ek U Bristyn Hernandez   [facility]  password [facility] 05/12/2016, 8:05 AM  LOS: 6 days

## 2016-05-12 NOTE — Plan of Care (Signed)
Problem: Physical Regulation: Goal: Will remain free from infection Outcome: Not Progressing Pt with septic emboli and endocarditis w/ vegetation. Not a surgical candidate. WBC remains elevated and spiking fevers despite IV abx.

## 2016-05-13 LAB — PREPARE RBC (CROSSMATCH)

## 2016-05-13 LAB — BASIC METABOLIC PANEL
Anion gap: 7 (ref 5–15)
BUN: 21 mg/dL — AB (ref 6–20)
CHLORIDE: 110 mmol/L (ref 101–111)
CO2: 17 mmol/L — ABNORMAL LOW (ref 22–32)
CREATININE: 1.23 mg/dL — AB (ref 0.44–1.00)
Calcium: 7.6 mg/dL — ABNORMAL LOW (ref 8.9–10.3)
GFR calc Af Amer: >60 mL/min (ref >60)
GFR calc non Af Amer: 59 mL/min — ABNORMAL LOW (ref >60)
Glucose, Bld: 107 mg/dL — ABNORMAL HIGH (ref 65–99)
Potassium: 4.3 mmol/L (ref 3.5–5.1)
SODIUM: 134 mmol/L — AB (ref 135–145)

## 2016-05-13 LAB — CBC
HCT: 22.9 % — ABNORMAL LOW (ref 36.0–46.0)
Hemoglobin: 7.4 g/dL — ABNORMAL LOW (ref 12.0–15.0)
MCH: 27.2 pg (ref 26.0–34.0)
MCHC: 32.3 g/dL (ref 30.0–36.0)
MCV: 84.2 fL (ref 78.0–100.0)
Platelets: 113 10*3/uL — ABNORMAL LOW (ref 150–400)
RBC: 2.72 MIL/uL — ABNORMAL LOW (ref 3.87–5.11)
RDW: 19.9 % — ABNORMAL HIGH (ref 11.5–15.5)
WBC: 11 10*3/uL — ABNORMAL HIGH (ref 4.0–10.5)

## 2016-05-13 LAB — GLUCOSE, CAPILLARY: GLUCOSE-CAPILLARY: 243 mg/dL — AB (ref 65–99)

## 2016-05-13 LAB — ABO/RH: ABO/RH(D): O POS

## 2016-05-13 LAB — RETICULOCYTES
RBC.: 2.97 MIL/uL — ABNORMAL LOW (ref 3.87–5.11)
Retic Count, Absolute: 50.5 10*3/uL (ref 19.0–186.0)
Retic Ct Pct: 1.7 % (ref 0.4–3.1)

## 2016-05-13 LAB — OSMOLALITY: Osmolality: 285 mOsm/kg (ref 275–295)

## 2016-05-13 LAB — SODIUM, URINE, RANDOM: SODIUM UR: 71 mmol/L

## 2016-05-13 LAB — OSMOLALITY, URINE: Osmolality, Ur: 420 mOsm/kg (ref 300–900)

## 2016-05-13 MED ORDER — LIP MEDEX EX OINT
TOPICAL_OINTMENT | CUTANEOUS | Status: DC | PRN
  Filled 2016-05-13: qty 7

## 2016-05-13 MED ORDER — HYDROCODONE-ACETAMINOPHEN 10-325 MG PO TABS
1.0000 | ORAL_TABLET | Freq: Four times a day (QID) | ORAL | Status: DC
  Administered 2016-05-13 (×3): 1 via ORAL
  Filled 2016-05-13 (×3): qty 1

## 2016-05-13 MED ORDER — ALBUTEROL SULFATE (2.5 MG/3ML) 0.083% IN NEBU
2.5000 mg | INHALATION_SOLUTION | Freq: Three times a day (TID) | RESPIRATORY_TRACT | Status: DC
  Administered 2016-05-13 – 2016-05-14 (×3): 2.5 mg via RESPIRATORY_TRACT
  Filled 2016-05-13 (×3): qty 3

## 2016-05-13 MED ORDER — HYDROCOD POLST-CPM POLST ER 10-8 MG/5ML PO SUER
5.0000 mL | Freq: Two times a day (BID) | ORAL | Status: DC
  Administered 2016-05-13 – 2016-05-17 (×9): 5 mL via ORAL
  Filled 2016-05-13 (×9): qty 5

## 2016-05-13 MED ORDER — ALBUTEROL SULFATE (2.5 MG/3ML) 0.083% IN NEBU: 2.5000 mg | INHALATION_SOLUTION | Freq: Four times a day (QID) | RESPIRATORY_TRACT | Status: DC

## 2016-05-13 MED ORDER — SODIUM CHLORIDE 0.9 % IV SOLN: Freq: Once | INTRAVENOUS | Status: DC

## 2016-05-13 MED ORDER — DEXTROMETHORPHAN POLISTIREX ER 30 MG/5ML PO SUER
30.0000 mg | Freq: Two times a day (BID) | ORAL | Status: DC
  Administered 2016-05-13 – 2016-05-15 (×5): 30 mg via ORAL
  Filled 2016-05-13 (×5): qty 5

## 2016-05-13 NOTE — Progress Notes (Signed)
Called to pt room by pt who noted IV to be leaking significantly while receiving PRBC transfusion.  Site flushed and continues to leak profusely.  Blood transfusion stopped, pt reports IV team has to use ultrasound to get an IV.  IV team called and up to pt room immediately but ultrasound is not functioning properly, thus unable to gain IV access after attempting without.  Another ultrasound is currently being used for a PICC placement and IV will attempt to gain IV access with that one later this evening.  Dr Butler Denmarkizwan made aware unable to transfuse remainder of the unit.  States to recheck H/H in AM.  No further orders.

## 2016-05-13 NOTE — Progress Notes (Addendum)
PROGRESS NOTE    Catherine Hernandez   ZOX:[id number]  DOB: [birth year]  DOA: 05/09/2016 PCP: No PCP Per Patient   Brief Narrative:  31 y.o.femalewith medical history significant for drug abuse with heroin, cocaine, tobacco, asthma endocarditis involving tricuspid valve and nonadherence with her treatment plan now presenting to the emergency department with generalized aches and pains, malaise, productive cough and fatigue.   She has had a complicated recent medical course involving hospitalization in December 2017 with Serratia bacteremia, bilateral pulmonary emboli and tricuspid valve endocarditis. She was evaluated by CT surgery at that time and deemed to not be a surgical candidate.She was treated with antibiotics in the hospital and discharged to complete a course of Cipro.   She returned to the hospital with the starting of January 2018 with sepsis secondary to HCAP and noted to have cavitary pulmonary lesions. She was treated with empiric vancomycin and cefepime in the hospital. Per ID, she was not a PICC candidate due to IV drug abuse and was discharged with 1 month of Bactrim which she never filled.   She returns to the hospital and found to be afebrile chest x-ray showed patchy bilateral air space opacities likely representing multifocal pneumonia #2 septic emboli or asymmetric edema. CT of the cervical spine notable for opacities and nodules in the lung apices with cavitation Blood Cx with MRSA.  Subjective: Coughing, feels miserable. Poor appetite.   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 Multifocal pneumonia with septic embolization -seen by CVTS in the past for Tricuspid vegetation- not a surgical candidate due to overall  malnourished condition and dental caries- -  Vegetation increasing in size with severe regurgitation noted on Echo on 05/08/16 -Patient was not a PICC line candidate at recent discharge due to ongoing IV drug abuse and states she didn't fill script for bactrim due to cost issues - currently on Daptomycin- will need aggressive care if she is going to survive this- will speak with social work about NH placement for her  Acute kidney injury - patient baseline creatinine 0.6 in January 2018, admitted with creatinine of 2.91. - creatinine improving  steadily -no hydronephrosis noted on Renal US  Hyponatremia - SIADH?- check U osm and sodium and S osm  Severe malnutrition - ensure and boost ordered  Anemia  - of acute illness - transfuse 1 U PRBC today and obtain anemia panel  Hypokalemia- -replaced  Thrombocytopenia- - acute since 2/3 likely due to sepsis and improving  Bilateral Pulmonary embolism -- patient was diagnosed with bilateral lower lobe PEs in December 2017 and has been on Eliquis  - temporarily switched to Heparin due to AKI  Polysubstance abuse - patient has ongoing IV drug abuse - UDS positive for cocaine, opiates, benzodiazepines.    DVT prophylaxis:  Apixaban  Procedures: 2 D ECHO  When compared to the prior study from 03/21/2016 the vegetation   on the tricuspid valve appears larger, now measuring 22 x 16 mm.   There is severe tricuspid regurgitation and severe pulmonary   hypertension.   There is no vegetation seen on the aortic, mitral or pulmonic   valves. Antimicrobials:  Anti-infectives    Start     Dose/Rate Route Frequency Ordered Stop   05/11/16 1800  DAPTOmycin (CUBICIN) 350  mg in sodium chloride 0.9 % IVPB     350 mg 214 mL/hr over 30 Minutes Intravenous Every 24 hours 05/11/16 1730     05/09/16 2200  vancomycin (VANCOCIN) IVPB 750 mg/150 ml premix  Status:  Discontinued     750 mg 150 mL/hr over 60 Minutes Intravenous Every 24 hours  05/09/16 2143 05/11/16 1708   05/07/16 2000  vancomycin (VANCOCIN) 500 mg in sodium chloride 0.9 % 100 mL IVPB  Status:  Discontinued     500 mg 100 mL/hr over 60 Minutes Intravenous Every 24 hours May 17, 2016 1824 05/09/16 2143   05/07/16 2000  ceFEPIme (MAXIPIME) 1 g in dextrose 5 % 50 mL IVPB  Status:  Discontinued     1 g 100 mL/hr over 30 Minutes Intravenous Every 24 hours 05/17/16 1824 05/07/16 1546   05-17-16 1800  ceFEPIme (MAXIPIME) 2 g in dextrose 5 % 50 mL IVPB     2 g 100 mL/hr over 30 Minutes Intravenous  Once May 17, 2016 1754 2016-05-17 1944   05/17/2016 1800  vancomycin (VANCOCIN) IVPB 1000 mg/200 mL premix     1,000 mg 200 mL/hr over 60 Minutes Intravenous  Once 05-17-16 1754 05/17/16 2030       Objective: Vitals:   05/12/16 2035 05/12/16 2109 05/13/16 0651 05/13/16 0833  BP:  122/79 124/82   Pulse: (!) 118 (!) 122 (!) 118 (!) 110  Resp: 20 (!) 24 (!) 24 (!) 25  Temp:  99.2 F (37.3 C) 99.2 F (37.3 C)   TempSrc:  Oral Oral   SpO2: 98% 95% 95% 96%  Weight:   42.9 kg (94 lb 9.2 oz)   Height:        Intake/Output Summary (Last 24 hours) at 05/13/16 1139 Last data filed at 05/13/16 1100  Gross per 24 hour  Intake              947 ml  Output             1400 ml  Net             -453 ml   Filed Weights   05/11/16 0351 05/12/16 0451 05/13/16 0651  Weight: 43.4 kg (95 lb 10.9 oz) 43.1 kg (95 lb 0.3 oz) 42.9 kg (94 lb 9.2 oz)    Examination: General exam: Appears comfortable  HEENT: PERRLA, oral mucosa moist, no sclera icterus or thrush Respiratory system: Clear to auscultation. Respiratory effort normal. Cardiovascular system: S1 & S2 heard, RRR.  No murmurs  Gastrointestinal system: Abdomen soft, non-tender, nondistended. Normal bowel sound. No organomegaly Central nervous system: Alert and oriented. No focal neurological deficits. Extremities: No cyanosis, clubbing or edema Skin: No rashes or ulcers Psychiatry:  Mood & affect appropriate.     Data Reviewed: I  have personally reviewed following labs and imaging studies  CBC:  Recent Labs Lab 17-May-2016 1554  05/07/16 0906  05/09/16 0528 05/10/16 0559 05/11/16 0545 05/12/16 0540 05/13/16 0514  WBC 16.2*  --  12.7*  < > 15.3* 19.6* 13.2* 14.9* 11.0*  NEUTROABS 13.9*  --  10.0*  --   --   --   --   --   --   HGB 12.1  --  10.1*  < > 9.4* 8.7* 7.9* 7.2* 7.4*  HCT 34.9*  --  29.4*  < > 28.9* 27.3* 24.2* 22.6* 22.9*  MCV 78.8  --  79.9  < > 83.5 82.0 83.2 84.0 84.2  PLT 81*  < > 73*  < >  97* 107* 93* 109* 113*  < > = values in this interval not displayed. Basic Metabolic Panel:  Recent Labs Lab 05/07/16 0906 05/08/16 0625 05/09/16 0528 05/11/16 0545 05/12/16 0540 05/13/16 0514  NA 129* 130* 132*  --  133* 134*  K 3.2* 4.1 4.2  --  4.4 4.3  CL 101 103 105  --  108 110  CO2 18* 18* 16*  --  18* 17*  GLUCOSE 168* 96 97  --  101* 107*  BUN 62* 53* 46*  --  27* 21*  CREATININE 2.78* 2.38* 2.18* 1.50* 1.31* 1.23*  CALCIUM 7.3* 7.6* 7.4*  --  7.5* 7.6*  MG 2.2  --   --   --   --   --    GFR: Estimated Creatinine Clearance: 46.1 mL/min (by C-G formula based on SCr of 1.23 mg/dL (H)). Liver Function Tests:  Recent Labs Lab 23-May-2016 2017 05/07/16 0906 05/08/16 0625  AST 12* 14* 14*  ALT 7* 8* 8*  ALKPHOS 103 91 95  BILITOT 1.5* 0.9 0.7  PROT 6.4* 6.2* 7.0  ALBUMIN 2.1* 2.0* 2.0*   No results for input(s): LIPASE, AMYLASE in the last 168 hours. No results for input(s): AMMONIA in the last 168 hours. Coagulation Profile:  Recent Labs Lab May 23, 2016 1626 May 23, 2016 2023  INR 1.12 1.24   Cardiac Enzymes:  Recent Labs Lab 2016-05-23 1554 05/12/16 0540  CKTOTAL 16* 7*   BNP (last 3 results) No results for input(s): PROBNP in the last 8760 hours. HbA1C: No results for input(s): HGBA1C in the last 72 hours. CBG:  Recent Labs Lab 05/09/16 0726 05/10/16 0731 05/11/16 0757 05/12/16 0733 05/13/16 0737  GLUCAP 79 99 119* 114* 243*   Lipid Profile: No results for  input(s): CHOL, HDL, LDLCALC, TRIG, CHOLHDL, LDLDIRECT in the last 72 hours. Thyroid Function Tests: No results for input(s): TSH, T4TOTAL, FREET4, T3FREE, THYROIDAB in the last 72 hours. Anemia Panel: No results for input(s): VITAMINB12, FOLATE, FERRITIN, TIBC, IRON, RETICCTPCT in the last 72 hours. Urine analysis:    Component Value Date/Time   COLORURINE YELLOW 05-23-16 1527   APPEARANCEUR HAZY (A) 23-May-2016 1527   LABSPEC 1.010 05-23-16 1527   PHURINE 5.0 May 23, 2016 1527   GLUCOSEU NEGATIVE 05/23/16 1527   HGBUR LARGE (A) 05-23-16 1527   BILIRUBINUR NEGATIVE 05-23-16 1527   KETONESUR NEGATIVE 05-23-16 1527   PROTEINUR 30 (A) 23-May-2016 1527   UROBILINOGEN 0.2 01/07/2012 1140   NITRITE NEGATIVE 2016/05/23 1527   LEUKOCYTESUR SMALL (A) May 23, 2016 1527   Sepsis Labs: @LABRCNTIP (procalcitonin:4,lacticidven:4) ) Recent Results (from the past 240 hour(s))  Urine culture     Status: Abnormal   Collection Time: 05/23/16  3:27 PM  Result Value Ref Range Status   Specimen Description URINE, RANDOM  Final   Special Requests NONE  Final   Culture (A)  Final    <10,000 COLONIES/mL INSIGNIFICANT GROWTH Performed at [facility], [address], [place], [place] [address]    Report Status 05/08/2016 FINAL  Final  Blood culture (routine x 2)     Status: Abnormal   Collection Time: 05-23-2016  5:35 PM  Result Value Ref Range Status   Specimen Description BLOOD LEFT ARM  Final   Special Requests IN PEDIATRIC BOTTLE 2CC  Final   Culture  Setup Time   Final    GRAM POSITIVE COCCI IN CLUSTERS IN PEDIATRIC BOTTLE CRITICAL RESULT CALLED TO, READ BACK BY AND VERIFIED WITH: T. GREEN PHARMD, AT 6045 05/07/16 BY D.  VANHOOK Performed at [facility], [address], [place], [place] [address]    Culture METHICILLIN RESISTANT STAPHYLOCOCCUS AUREUS (A)  Final   Report Status 05/09/2016 FINAL  Final   Organism ID, Bacteria METHICILLIN RESISTANT STAPHYLOCOCCUS AUREUS  Final        Susceptibility   Methicillin resistant staphylococcus aureus - MIC*    CIPROFLOXACIN <=0.5 SENSITIVE Sensitive     ERYTHROMYCIN >=8 RESISTANT Resistant     GENTAMICIN <=0.5 SENSITIVE Sensitive     OXACILLIN >=4 RESISTANT Resistant     TETRACYCLINE >=16 RESISTANT Resistant     VANCOMYCIN 1 SENSITIVE Sensitive     TRIMETH/SULFA <=10 SENSITIVE Sensitive     CLINDAMYCIN >=8 RESISTANT Resistant     RIFAMPIN <=0.5 SENSITIVE Sensitive     Inducible Clindamycin NEGATIVE Sensitive     * METHICILLIN RESISTANT STAPHYLOCOCCUS AUREUS  Blood Culture ID Panel (Reflexed)     Status: Abnormal   Collection Time: 24-May-2016  5:35 PM  Result Value Ref Range Status   Enterococcus species NOT DETECTED NOT DETECTED Final   Listeria monocytogenes NOT DETECTED NOT DETECTED Final   Staphylococcus species DETECTED (A) NOT DETECTED Final    Comment: CRITICAL RESULT CALLED TO, READ BACK BY AND VERIFIED WITH: T. GREEN PHARMD, AT 6045 05/07/16 BY D. VANHOOK    Staphylococcus aureus DETECTED (A) NOT DETECTED Final    Comment: Methicillin (oxacillin)-resistant Staphylococcus aureus (MRSA). MRSA is predictably resistant to beta-lactam antibiotics (except ceftaroline). Preferred therapy is vancomycin unless clinically contraindicated. Patient requires contact precautions if  hospitalized. CRITICAL RESULT CALLED TO, READ BACK BY AND VERIFIED WITH: T. GREEN PHARMD, AT 4098 05/07/16 BY D. VANHOOK    Methicillin resistance DETECTED (A) NOT DETECTED Final    Comment: CRITICAL RESULT CALLED TO, READ BACK BY AND VERIFIED WITH: T. GREEN PHARMD, AT 1191 05/07/16 BY D. VANHOOK    Streptococcus species NOT DETECTED NOT DETECTED Final   Streptococcus agalactiae NOT DETECTED NOT DETECTED Final   Streptococcus pneumoniae NOT DETECTED NOT DETECTED Final   Streptococcus pyogenes NOT DETECTED NOT DETECTED Final   Acinetobacter baumannii NOT DETECTED NOT DETECTED Final   Enterobacteriaceae species NOT DETECTED NOT DETECTED Final    Enterobacter cloacae complex NOT DETECTED NOT DETECTED Final   Escherichia coli NOT DETECTED NOT DETECTED Final   Klebsiella oxytoca NOT DETECTED NOT DETECTED Final   Klebsiella pneumoniae NOT DETECTED NOT DETECTED Final   Proteus species NOT DETECTED NOT DETECTED Final   Serratia marcescens NOT DETECTED NOT DETECTED Final   Haemophilus influenzae NOT DETECTED NOT DETECTED Final   Neisseria meningitidis NOT DETECTED NOT DETECTED Final   Pseudomonas aeruginosa NOT DETECTED NOT DETECTED Final   Candida albicans NOT DETECTED NOT DETECTED Final   Candida glabrata NOT DETECTED NOT DETECTED Final   Candida krusei NOT DETECTED NOT DETECTED Final   Candida parapsilosis NOT DETECTED NOT DETECTED Final   Candida tropicalis NOT DETECTED NOT DETECTED Final    Comment: Performed at [facility], [address] [place], [place] [address]  Blood culture (routine x 2)     Status: None   Collection Time: 24-May-2016  6:18 PM  Result Value Ref Range Status   Specimen Description BLOOD RIGHT NECK  Final   Special Requests IN PEDIATRIC BOTTLE 2CC  Final   Culture   Final    NO GROWTH 5 DAYS Performed at [facility], [address] [place], [place] [address]    Report Status 05/11/2016 FINAL  Final  MRSA  PCR Screening     Status: None   Collection Time: 05/07/16 12:57 AM  Result Value Ref Range Status   MRSA by PCR NEGATIVE NEGATIVE Final    Comment:        The GeneXpert MRSA Assay (FDA approved for NASAL specimens only), is one component of a comprehensive MRSA colonization surveillance program. It is not intended to diagnose MRSA infection nor to guide or monitor treatment for MRSA infections.   Acid Fast Smear (AFB)     Status: None   Collection Time: 05/07/16  1:20 PM  Result Value Ref Range Status   AFB Specimen Processing Concentration  Final   Acid Fast Smear Negative  Final    Comment: (NOTE) Performed At: [facility] [address] [place], [place]  [id number] Mila HomerHancock William F MD GN:[id number]:[phone]    Source (AFB) SPUTUM  Final  Culture, sputum-assessment     Status: None   Collection Time: 05/07/16  1:22 PM  Result Value Ref Range Status   Specimen Description SPUTUM  Final   Special Requests Immunocompromised  Final   Sputum evaluation THIS SPECIMEN IS ACCEPTABLE FOR SPUTUM CULTURE  Final   Report Status 05/07/2016 FINAL  Final  Culture, respiratory (NON-Expectorated)     Status: None   Collection Time: 05/07/16  1:22 PM  Result Value Ref Range Status   Specimen Description SPUTUM  Final   Special Requests Immunocompromised Reflexed from [id number]  Final   Gram Stain   Final    ABUNDANT WBC PRESENT, PREDOMINANTLY PMN MODERATE YEAST MODERATE GRAM POSITIVE RODS Performed at [facility], [address] [place], [place] [address]    Culture MODERATE STAPHYLOCOCCUS AUREUS  Final   Report Status 05/10/2016 FINAL  Final   Organism ID, Bacteria STAPHYLOCOCCUS AUREUS  Final      Susceptibility   Staphylococcus aureus - MIC*    CIPROFLOXACIN <=0.5 SENSITIVE Sensitive     ERYTHROMYCIN >=8 RESISTANT Resistant     GENTAMICIN <=0.5 SENSITIVE Sensitive     OXACILLIN 0.5 SENSITIVE Sensitive     TETRACYCLINE >=16 RESISTANT Resistant     VANCOMYCIN 1 SENSITIVE Sensitive     TRIMETH/SULFA <=10 SENSITIVE Sensitive     CLINDAMYCIN >=8 RESISTANT Resistant     RIFAMPIN <=0.5 SENSITIVE Sensitive     Inducible Clindamycin NEGATIVE Sensitive     * MODERATE STAPHYLOCOCCUS AUREUS  Culture, blood (Routine X 2) w Reflex to ID Panel     Status: Abnormal   Collection Time: 05/08/16  6:25 AM  Result Value Ref Range Status   Specimen Description BLOOD LEFT ARM  Final   Special Requests IN PEDIATRIC BOTTLE 2CC  Final   Culture  Setup Time   Final    GRAM POSITIVE COCCI IN CLUSTERS AEROBIC BOTTLE ONLY CRITICAL VALUE NOTED.  VALUE IS CONSISTENT WITH PREVIOUSLY REPORTED AND CALLED VALUE.    Culture (A)  Final    STAPHYLOCOCCUS  AUREUS SUSCEPTIBILITIES PERFORMED ON PREVIOUS CULTURE WITHIN THE LAST 5 DAYS. Performed at [facility], [address] [place], [place] [address]    Report Status 05/11/2016 FINAL  Final  Culture, blood (routine x 2)     Status: Abnormal   Collection Time: 05/08/16  5:59 PM  Result Value Ref Range Status   Specimen Description BLOOD LEFT HAND  Final   Special Requests IN PEDIATRIC BOTTLE 1 CC  Final   Culture  Setup Time   Final    GRAM POSITIVE COCCI IN CLUSTERS IN PEDIATRIC  BOTTLE CRITICAL RESULT CALLED TO, READ BACK BY AND VERIFIED WITH: PHARMD J GADHIA [id number] MLM    Culture (A)  Final    STAPHYLOCOCCUS AUREUS SUSCEPTIBILITIES PERFORMED ON PREVIOUS CULTURE WITHIN THE LAST 5 DAYS. Performed at [facility], [address], [place], [place] [address]    Report Status 05/11/2016 FINAL  Final  Culture, blood (routine x 2)     Status: Abnormal   Collection Time: 05/08/16  5:59 PM  Result Value Ref Range Status   Specimen Description BLOOD LEFT ARM  Final   Special Requests IN PEDIATRIC BOTTLE 1.5CC  Final   Culture  Setup Time   Final    GRAM POSITIVE COCCI IN CLUSTERS AEROBIC BOTTLE ONLY CRITICAL VALUE NOTED.  VALUE IS CONSISTENT WITH PREVIOUSLY REPORTED AND CALLED VALUE.    Culture (A)  Final    STAPHYLOCOCCUS AUREUS SUSCEPTIBILITIES PERFORMED ON PREVIOUS CULTURE WITHIN THE LAST 5 DAYS. Performed at [facility], [address], [place], [place] [address]    Report Status 05/11/2016 FINAL  Final  Culture, blood (Routine X 2) w Reflex to ID Panel     Status: Abnormal   Collection Time: 05/08/16  9:49 PM  Result Value Ref Range Status   Specimen Description BLOOD LEFT HAND  Final   Special Requests IN PEDIATRIC BOTTLE 0.5CC  Final   Culture  Setup Time   Final    GRAM POSITIVE COCCI IN CLUSTERS IN PEDIATRIC BOTTLE CRITICAL RESULT CALLED TO, READ BACK BY AND VERIFIED WITH: BETH GREEN,PHARMD @2339  002/05/18 MKELLY,MLT Performed at [facility], [address] [place], [place] [address]    Culture METHICILLIN RESISTANT STAPHYLOCOCCUS AUREUS (A)  Final   Report Status 05/11/2016 FINAL  Final   Organism ID, Bacteria METHICILLIN RESISTANT STAPHYLOCOCCUS AUREUS  Final      Susceptibility   Methicillin resistant staphylococcus aureus - MIC*    CIPROFLOXACIN <=0.5 SENSITIVE Sensitive     ERYTHROMYCIN >=8 RESISTANT Resistant     GENTAMICIN <=0.5 SENSITIVE Sensitive     OXACILLIN >=4 RESISTANT Resistant     TETRACYCLINE >=16 RESISTANT Resistant     VANCOMYCIN <=0.5 SENSITIVE Sensitive     TRIMETH/SULFA <=10 SENSITIVE Sensitive     CLINDAMYCIN >=8 RESISTANT Resistant     RIFAMPIN <=0.5 SENSITIVE Sensitive     Inducible Clindamycin NEGATIVE Sensitive     * METHICILLIN RESISTANT STAPHYLOCOCCUS AUREUS  Acid Fast Smear (AFB)     Status: None   Collection Time: 05/10/16  6:09 AM  Result Value Ref Range Status   AFB Specimen Processing Concentration  Final   Acid Fast Smear Negative  Final    Comment: (NOTE) Performed At: [facility] [address] [place], [place] [address] Mila Homer MD VH:[id number]    Source (AFB) SPUTUM  Final  Culture, blood (routine x 2)     Status: Abnormal   Collection Time: 05/10/16 11:18 AM  Result Value Ref Range Status   Specimen Description BLOOD RIGHT ARM  Final   Special Requests IN PEDIATRIC BOTTLE 3CC  Final   Culture  Setup Time   Final    GRAM POSITIVE COCCI IN CLUSTERS IN PEDIATRIC BOTTLE CRITICAL VALUE NOTED.  VALUE IS CONSISTENT WITH PREVIOUSLY REPORTED AND CALLED VALUE.    Culture (A)  Final    STAPHYLOCOCCUS AUREUS SUSCEPTIBILITIES PERFORMED ON PREVIOUS CULTURE WITHIN THE LAST 5 DAYS. Performed at [facility], [address], [place], [place] [address]    Report  Status 05/12/2016 FINAL  Final         Radiology Studies: No results found.    Scheduled Meds: . apixaban  5 mg Oral BID  . benzonatate  200 mg Oral TID  . chlorhexidine  15 mL  Mouth Rinse BID  . DAPTOmycin (CUBICIN)  IV  350 mg Intravenous Q24H  . dextromethorphan  30 mg Oral BID  . feeding supplement  1 Container Oral BID BM  . feeding supplement (ENSURE ENLIVE)  237 mL Oral BID BM  . levalbuterol  0.63 mg Nebulization BID  . mouth rinse  15 mL Mouth Rinse q12n4p  . multivitamin with minerals  1 tablet Oral Daily  . sodium chloride flush  3 mL Intravenous Q12H   Continuous Infusions:   LOS: 7 days    Time spent in minutes: 35    Torryn Fiske, MD [facility] Pager: [email or web address] Password [facility] 05/13/2016, 11:39 AM

## 2016-05-14 LAB — CBC
HCT: 24.3 % — ABNORMAL LOW (ref 36.0–46.0)
Hemoglobin: 7.8 g/dL — ABNORMAL LOW (ref 12.0–15.0)
MCH: 27.1 pg (ref 26.0–34.0)
MCHC: 32.1 g/dL (ref 30.0–36.0)
MCV: 84.4 fL (ref 78.0–100.0)
Platelets: 174 10*3/uL (ref 150–400)
RBC: 2.88 MIL/uL — ABNORMAL LOW (ref 3.87–5.11)
RDW: 19 % — ABNORMAL HIGH (ref 11.5–15.5)
WBC: 10.1 10*3/uL (ref 4.0–10.5)

## 2016-05-14 LAB — GLUCOSE, CAPILLARY: GLUCOSE-CAPILLARY: 104 mg/dL — AB (ref 65–99)

## 2016-05-14 LAB — IRON AND TIBC
Iron: 17 ug/dL — ABNORMAL LOW (ref 28–170)
Saturation Ratios: 12 % (ref 10.4–31.8)
TIBC: 140 ug/dL — AB (ref 250–450)
UIBC: 123 ug/dL

## 2016-05-14 LAB — VITAMIN B12: Vitamin B-12: 797 pg/mL (ref 180–914)

## 2016-05-14 LAB — FERRITIN: Ferritin: 632 ng/mL — ABNORMAL HIGH (ref 11–307)

## 2016-05-14 LAB — FOLATE: Folate: 17.3 ng/mL (ref >5.9)

## 2016-05-14 MED ORDER — LORAZEPAM 2 MG/ML IJ SOLN
INTRAMUSCULAR | Status: AC
  Administered 2016-05-14: 1 mg via INTRAVENOUS
  Filled 2016-05-14: qty 1

## 2016-05-14 MED ORDER — ALBUTEROL SULFATE (2.5 MG/3ML) 0.083% IN NEBU
2.5000 mg | INHALATION_SOLUTION | Freq: Two times a day (BID) | RESPIRATORY_TRACT | Status: DC
  Administered 2016-05-14: 2.5 mg via RESPIRATORY_TRACT
  Filled 2016-05-14: qty 3

## 2016-05-14 MED ORDER — VITAMIN B-12 1000 MCG PO TABS
1000.0000 ug | ORAL_TABLET | Freq: Every day | ORAL | Status: DC
  Administered 2016-05-14 – 2016-05-15 (×2): 1000 ug via ORAL
  Filled 2016-05-14 (×2): qty 1

## 2016-05-14 MED ORDER — MORPHINE SULFATE (PF) 4 MG/ML IV SOLN
2.0000 mg | INTRAVENOUS | Status: DC | PRN
  Administered 2016-05-14: 2 mg via INTRAVENOUS
  Filled 2016-05-14: qty 1

## 2016-05-14 MED ORDER — MORPHINE SULFATE (PF) 4 MG/ML IV SOLN
2.0000 mg | INTRAVENOUS | Status: DC | PRN
  Administered 2016-05-14 – 2016-05-16 (×12): 2 mg via INTRAVENOUS
  Filled 2016-05-14 (×12): qty 1

## 2016-05-14 MED ORDER — LORAZEPAM 2 MG/ML IJ SOLN
1.0000 mg | INTRAMUSCULAR | Status: DC | PRN
  Administered 2016-05-14 – 2016-05-17 (×9): 1 mg via INTRAVENOUS
  Filled 2016-05-14 (×8): qty 1

## 2016-05-14 MED ORDER — HYDROCODONE-ACETAMINOPHEN 10-325 MG PO TABS
2.0000 | ORAL_TABLET | ORAL | Status: DC
  Administered 2016-05-14 – 2016-05-15 (×6): 2 via ORAL
  Filled 2016-05-14 (×8): qty 2

## 2016-05-14 NOTE — Progress Notes (Signed)
Pharmacy Antibiotic Note  Margretta Dittyngela N Leland is a 31 y.o. female with hx serratia bacteremia, IVDU, polysubstance abuse presented to the ED on 05/27/2016 with c/o right shoulder pain and generalized pain s/p fall. She was started on vancomycin and cefepime on admission for sepsis and suspected PNA.  Cefepime d/ced on 05/07/16 and vancomycin changed to Daptomycin per ID for ongoing culture positive MRSA bacteremia.  Today, 05/14/2016:  - Day # 9 abx, Day #4 Daptomycin - SCr decreased to 1.23 (crcl~44) - WBC 11, decreasing - Tmax 99.6 - CK 7 (2/9) - repeat BCx pending   Plan: - Continue Daptomycin 350 mg IV q24h (~8 mg/kg/day) - Monitor renal function - Check CK weekly  ____________________________________  Height: 5\' 5"  (165.1 cm) Weight: 91 lb 11.4 oz (41.6 kg) IBW/kg (Calculated) : 57  Temp (24hrs), Avg:99 F (37.2 C), Min:98 F (36.7 C), Max:99.6 F (37.6 C)   Recent Labs Lab 05/08/16 0625 05/09/16 0528 05/09/16 2054 05/10/16 0559 05/11/16 0545 05/12/16 0540 05/13/16 0514  WBC 12.7* 15.3*  --  19.6* 13.2* 14.9* 11.0*  CREATININE 2.38* 2.18*  --   --  1.50* 1.31* 1.23*  VANCOTROUGH  --   --  10*  --   --   --   --     Estimated Creatinine Clearance: 44.7 mL/min (by C-G formula based on SCr of 1.23 mg/dL (H)).    No Known Allergies  Antimicrobials this admission:  2/3 Vanc >> 2/8 2/3 Cefepime >> 2/4 2/8 Daptomycin >>  Dose adjustments this admission:  2/6 Vancomycin trough level = 10  on Vanc 1g x1 then 500mg  q24h (prior to 4th dose) ==> inc to 750mg  IV q24h  Microbiology results:  2/3 BCx x2: MRSA 2/3 UCx: insign growth - final 2/3 HIV Ab: NR 2/4 MRSA PCR: negative 2/4 Respiratory: MSSA (res to Clinda,Ery,TCN) 2/4 AFB smear/culture: neg/ngtd 2/5 BCx x2: MRSA  2/7 BCx: MRSA. 2/8 Influenza A/B: negative 2/10 BCx:     Thank you for allowing pharmacy to be a part of this patient's care.  Lynann Beaverhristine Kaydan Wong PharmD, BCPS Pager [phone] 05/14/2016 9:02 AM

## 2016-05-14 NOTE — Progress Notes (Signed)
MD consulted CSW to evaluate for SNF placement for IV antibiotics.  Pt with previous SNF stay for IV antibiotics and left AMA after 4 hours and proceeded to use drugs within 24 hours of leaving facility   CSW discussed patient with CSW director- we will NOT pursue SNF placement for this patient due to past behaviors- if pt is requiring long term IV antibiotics pt will have to stay at the hospital to receive treatment or DC home on oral antibiotics  CSW signing off  Burna SisJenna H. Marton Malizia, LCSW Clinical Social Worker [phone]

## 2016-05-14 NOTE — Progress Notes (Signed)
PROGRESS NOTE    Catherine BALESTRIERI   ZOX:[id number]  DOB: 1985-09-22  DOA: 01-Jun-2016 PCP: No PCP Per Patient   Brief Narrative:  31 y.o.femalewith medical history significant for drug abuse with heroin, cocaine, tobacco, asthma endocarditis involving tricuspid valve and nonadherence with her treatment plan now presenting to the emergency department with generalized aches and pains, malaise, productive cough and fatigue.   She has had a complicated recent medical course involving hospitalization in December 2017 with Serratia bacteremia, bilateral pulmonary emboli and tricuspid valve endocarditis. She was evaluated by CT surgery at that time and deemed to not be a surgical candidate.She was treated with antibiotics in the hospital and discharged to complete a course of Cipro.   She returned to the hospital with the starting of January 2018 with sepsis secondary to HCAP and noted to have cavitary pulmonary lesions. She was treated with empiric vancomycin and cefepime in the hospital. Per ID, she was not a PICC candidate due to IV drug abuse and was discharged with 1 month of Bactrim which she never filled.   She returns to the hospital and found to be afebrile chest x-ray showed patchy bilateral air space opacities likely representing multifocal pneumonia #2 septic emboli or asymmetric edema. CT of the cervical spine notable for opacities and nodules in the lung apices with cavitation Blood Cx with MRSA.  Subjective: Coughing with severe chest pain. Very anxiousfeels miserable. Poor appetite. Agrees to go to SNF for IV antibiotics.  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 pneumonia with septic embolization -seen by CVTS  in the past for Tricuspid vegetation- not a surgical candidate due to overall malnourished condition and dental caries- -  Vegetation increasing in size with severe regurgitation noted on Echo on 05/08/16 -Patient was not a PICC line candidate at recent discharge due to ongoing IV drug abuse and states she didn't fill script for bactrim due to cost issues - currently on Daptomycin- will need aggressive care if she is going to survive this- have spoken with social work about NH placement for her- patient is agreeable  Acute kidney injury - patient baseline creatinine 0.6 in January 2018, admitted with creatinine of 2.91. - creatinine improving steadily- labs still pending -no hydronephrosis noted on Renal US  Hyponatremia - SIADH?- check U osm and sodium and S osm- they are consistent with SIADH- barely drinking fluids therefore already fluid restricted  Severe malnutrition - ensure and boost ordered  Anemia  - of acute illness - transfused 1 U PRBC yesterday but IV leaked prior to bag finishing- she received about 2/3 of it- awaiting CBC - anemia panel reveals normal Iron and Folate levels- B12 is low normal- will replace with s/c 1000 mcg x 3 days  Hypokalemia- -replaced  Thrombocytopenia- - acute since 2/3 likely due to sepsis and improving  Bilateral Pulmonary embolism -- patient was diagnosed with bilateral lower lobe PEs in December 2017 and has been on Eliquis  - temporarily switched to Heparin due to AKI  Polysubstance abuse - patient has ongoing IV drug abuse - UDS positive for cocaine, opiates, benzodiazepines.    DVT prophylaxis:  Apixaban  Procedures: 2 D ECHO  When compared to the prior study from 03/21/2016 the vegetation  on the tricuspid valve appears larger, now measuring 22 x 16 mm.   There is severe tricuspid regurgitation and severe pulmonary   hypertension.   There is no vegetation seen on the aortic, mitral or pulmonic   valves. Antimicrobials:    Anti-infectives    Start     Dose/Rate Route Frequency Ordered Stop   05/11/16 1800  DAPTOmycin (CUBICIN) 350 mg in sodium chloride 0.9 % IVPB     350 mg 214 mL/hr over 30 Minutes Intravenous Every 24 hours 05/11/16 1730     05/09/16 2200  vancomycin (VANCOCIN) IVPB 750 mg/150 ml premix  Status:  Discontinued     750 mg 150 mL/hr over 60 Minutes Intravenous Every 24 hours 05/09/16 2143 05/11/16 1708   05/07/16 2000  vancomycin (VANCOCIN) 500 mg in sodium chloride 0.9 % 100 mL IVPB  Status:  Discontinued     500 mg 100 mL/hr over 60 Minutes Intravenous Every 24 hours 05/25/2016 1824 05/09/16 2143   05/07/16 2000  ceFEPIme (MAXIPIME) 1 g in dextrose 5 % 50 mL IVPB  Status:  Discontinued     1 g 100 mL/hr over 30 Minutes Intravenous Every 24 hours 05/23/2016 1824 05/07/16 1546   05/04/2016 1800  ceFEPIme (MAXIPIME) 2 g in dextrose 5 % 50 mL IVPB     2 g 100 mL/hr over 30 Minutes Intravenous  Once 05/25/2016 1754 05/19/2016 1944   05/13/2016 1800  vancomycin (VANCOCIN) IVPB 1000 mg/200 mL premix     1,000 mg 200 mL/hr over 60 Minutes Intravenous  Once 05/05/2016 1754 05/09/2016 2030       Objective: Vitals:   05/13/16 2133 05/14/16 0106 05/14/16 0420 05/14/16 0930  BP: 130/85 118/88 127/87   Pulse: (!) 124 (!) 119 (!) 122 (!) 120  Resp: (!) 24 (!) 22 (!) 30 18  Temp: 99.6 F (37.6 C) 99.5 F (37.5 C) 98 F (36.7 C)   TempSrc: Oral Oral Oral   SpO2: 97% 98% 96% 96%  Weight:   41.6 kg (91 lb 11.4 oz)   Height:        Intake/Output Summary (Last 24 hours) at 05/14/16 0943 Last data filed at 05/14/16 0236  Gross per 24 hour  Intake             1037 ml  Output              925 ml  Net              112 ml   Filed Weights   05/12/16 0451 05/13/16 0651 05/14/16 0420  Weight: 43.1 kg (95 lb 0.3 oz) 42.9 kg (94 lb 9.2 oz) 41.6 kg (91 lb 11.4 oz)    Examination: General exam: Appears very uncomfortable, cachectic  HEENT: PERRLA, oral mucosa moist, no sclera icterus or thrush Respiratory  system: Clear to auscultation. Respiratory effort normal.- severe cough Cardiovascular system: S1 & S2 heard, RRR.  No murmurs  Gastrointestinal system: Abdomen soft, non-tender, nondistended. Normal bowel sound. No organomegaly Central nervous system: Alert and oriented. No focal neurological deficits. Extremities: No cyanosis, clubbing or edema Skin: No rashes or ulcers Psychiatry:  Mood & affect appropriate.     Data Reviewed: I have personally reviewed following labs and imaging studies  CBC:  Recent Labs Lab 05/09/16 0528 05/10/16 0559 05/11/16 0545 05/12/16 0540 05/13/16 0514  WBC 15.3* 19.6* 13.2* 14.9* 11.0*  HGB 9.4* 8.7* 7.9* 7.2* 7.4*  HCT 28.9* 27.3* 24.2* 22.6* 22.9*  MCV 83.5 82.0 83.2  84.0 84.2  PLT 97* 107* 93* 109* 113*   Basic Metabolic Panel:  Recent Labs Lab 05/08/16 0625 05/09/16 0528 05/11/16 0545 05/12/16 0540 05/13/16 0514  NA 130* 132*  --  133* 134*  K 4.1 4.2  --  4.4 4.3  CL 103 105  --  108 110  CO2 18* 16*  --  18* 17*  GLUCOSE 96 97  --  101* 107*  BUN 53* 46*  --  27* 21*  CREATININE 2.38* 2.18* 1.50* 1.31* 1.23*  CALCIUM 7.6* 7.4*  --  7.5* 7.6*   GFR: Estimated Creatinine Clearance: 44.7 mL/min (by C-G formula based on SCr of 1.23 mg/dL (H)). Liver Function Tests:  Recent Labs Lab 05/08/16 0625  AST 14*  ALT 8*  ALKPHOS 95  BILITOT 0.7  PROT 7.0  ALBUMIN 2.0*   No results for input(s): LIPASE, AMYLASE in the last 168 hours. No results for input(s): AMMONIA in the last 168 hours. Coagulation Profile: No results for input(s): INR, PROTIME in the last 168 hours. Cardiac Enzymes:  Recent Labs Lab 05/12/16 0540  CKTOTAL 7*   BNP (last 3 results) No results for input(s): PROBNP in the last 8760 hours. HbA1C: No results for input(s): HGBA1C in the last 72 hours. CBG:  Recent Labs Lab 05/10/16 0731 05/11/16 0757 05/12/16 0733 05/13/16 0737 05/14/16 0759  GLUCAP 99 119* 114* 243* 104*   Lipid Profile: No  results for input(s): CHOL, HDL, LDLCALC, TRIG, CHOLHDL, LDLDIRECT in the last 72 hours. Thyroid Function Tests: No results for input(s): TSH, T4TOTAL, FREET4, T3FREE, THYROIDAB in the last 72 hours. Anemia Panel:  Recent Labs  05/13/16 1309  RETICCTPCT 1.7   Urine analysis:    Component Value Date/Time   COLORURINE YELLOW 05/22/2016 1527   APPEARANCEUR HAZY (A) 05/27/2016 1527   LABSPEC 1.010 05/17/2016 1527   PHURINE 5.0 05/17/2016 1527   GLUCOSEU NEGATIVE 05/27/2016 1527   HGBUR LARGE (A) 05/27/2016 1527   BILIRUBINUR NEGATIVE 05/05/2016 1527   KETONESUR NEGATIVE 05/15/2016 1527   PROTEINUR 30 (A) 05/17/2016 1527   UROBILINOGEN 0.2 01/07/2012 1140   NITRITE NEGATIVE 05/14/2016 1527   LEUKOCYTESUR SMALL (A) 05/15/2016 1527   Sepsis Labs: @LABRCNTIP (procalcitonin:4,lacticidven:4) ) Recent Results (from the past 240 hour(s))  Urine culture     Status: Abnormal   Collection Time: 05/16/2016  3:27 PM  Result Value Ref Range Status   Specimen Description URINE, RANDOM  Final   Special Requests NONE  Final   Culture (A)  Final    <10,000 COLONIES/mL INSIGNIFICANT GROWTH Performed at [facility], [address] [place], [place] [address]    Report Status 05/08/2016 FINAL  Final  Blood culture (routine x 2)     Status: Abnormal   Collection Time: 05/15/2016  5:35 PM  Result Value Ref Range Status   Specimen Description BLOOD LEFT ARM  Final   Special Requests IN PEDIATRIC BOTTLE 2CC  Final   Culture  Setup Time   Final    GRAM POSITIVE COCCI IN CLUSTERS IN PEDIATRIC BOTTLE CRITICAL RESULT CALLED TO, READ BACK BY AND VERIFIED WITH: T. GREEN PHARMD, AT [id number] 05/07/16 BY Renato Shin. VANHOOK Performed at [facility], [address] [place], [place] [address]    Culture METHICILLIN RESISTANT STAPHYLOCOCCUS AUREUS (A)  Final   Report Status 05/09/2016 FINAL  Final   Organism ID, Bacteria METHICILLIN RESISTANT STAPHYLOCOCCUS AUREUS  Final      Susceptibility   Methicillin  resistant staphylococcus aureus -  MIC*    CIPROFLOXACIN <=0.5 SENSITIVE Sensitive     ERYTHROMYCIN >=8 RESISTANT Resistant     GENTAMICIN <=0.5 SENSITIVE Sensitive     OXACILLIN >=4 RESISTANT Resistant     TETRACYCLINE >=16 RESISTANT Resistant     VANCOMYCIN 1 SENSITIVE Sensitive     TRIMETH/SULFA <=10 SENSITIVE Sensitive     CLINDAMYCIN >=8 RESISTANT Resistant     RIFAMPIN <=0.5 SENSITIVE Sensitive     Inducible Clindamycin NEGATIVE Sensitive     * METHICILLIN RESISTANT STAPHYLOCOCCUS AUREUS  Blood Culture ID Panel (Reflexed)     Status: Abnormal   Collection Time: 05/16/2016  5:35 PM  Result Value Ref Range Status   Enterococcus species NOT DETECTED NOT DETECTED Final   Listeria monocytogenes NOT DETECTED NOT DETECTED Final   Staphylococcus species DETECTED (A) NOT DETECTED Final    Comment: CRITICAL RESULT CALLED TO, READ BACK BY AND VERIFIED WITH: T. GREEN PHARMD, AT 1610 05/07/16 BY D. VANHOOK    Staphylococcus aureus DETECTED (A) NOT DETECTED Final    Comment: Methicillin (oxacillin)-resistant Staphylococcus aureus (MRSA). MRSA is predictably resistant to beta-lactam antibiotics (except ceftaroline). Preferred therapy is vancomycin unless clinically contraindicated. Patient requires contact precautions if  hospitalized. CRITICAL RESULT CALLED TO, READ BACK BY AND VERIFIED WITH: T. GREEN PHARMD, AT 9604 05/07/16 BY D. VANHOOK    Methicillin resistance DETECTED (A) NOT DETECTED Final    Comment: CRITICAL RESULT CALLED TO, READ BACK BY AND VERIFIED WITH: T. GREEN PHARMD, AT 5409 05/07/16 BY D. VANHOOK    Streptococcus species NOT DETECTED NOT DETECTED Final   Streptococcus agalactiae NOT DETECTED NOT DETECTED Final   Streptococcus pneumoniae NOT DETECTED NOT DETECTED Final   Streptococcus pyogenes NOT DETECTED NOT DETECTED Final   Acinetobacter baumannii NOT DETECTED NOT DETECTED Final   Enterobacteriaceae species NOT DETECTED NOT DETECTED Final   Enterobacter cloacae complex NOT  DETECTED NOT DETECTED Final   Escherichia coli NOT DETECTED NOT DETECTED Final   Klebsiella oxytoca NOT DETECTED NOT DETECTED Final   Klebsiella pneumoniae NOT DETECTED NOT DETECTED Final   Proteus species NOT DETECTED NOT DETECTED Final   Serratia marcescens NOT DETECTED NOT DETECTED Final   Haemophilus influenzae NOT DETECTED NOT DETECTED Final   Neisseria meningitidis NOT DETECTED NOT DETECTED Final   Pseudomonas aeruginosa NOT DETECTED NOT DETECTED Final   Candida albicans NOT DETECTED NOT DETECTED Final   Candida glabrata NOT DETECTED NOT DETECTED Final   Candida krusei NOT DETECTED NOT DETECTED Final   Candida parapsilosis NOT DETECTED NOT DETECTED Final   Candida tropicalis NOT DETECTED NOT DETECTED Final    Comment: Performed at [facility], [address], [place], [place] [address]  Blood culture (routine x 2)     Status: None   Collection Time: 05/11/2016  6:18 PM  Result Value Ref Range Status   Specimen Description BLOOD RIGHT NECK  Final   Special Requests IN PEDIATRIC BOTTLE 2CC  Final   Culture   Final    NO GROWTH 5 DAYS Performed at [facility], [address] [place], [place] [address]    Report Status 05/11/2016 FINAL  Final  MRSA PCR Screening     Status: None   Collection Time: 05/07/16 12:57 AM  Result Value Ref Range Status   MRSA by PCR NEGATIVE NEGATIVE Final    Comment:        The GeneXpert MRSA Assay (FDA approved for NASAL specimens only), is one component of a comprehensive MRSA colonization surveillance  program. It is not intended to diagnose MRSA infection nor to guide or monitor treatment for MRSA infections.   Acid Fast Smear (AFB)     Status: None   Collection Time: 05/07/16  1:20 PM  Result Value Ref Range Status   AFB Specimen Processing Concentration  Final   Acid Fast Smear Negative  Final    Comment: (NOTE) Performed At: [facility] [address] [place], [place] [id number] Mila Homer MD  WU:[id number]    Source (AFB) SPUTUM  Final  Culture, sputum-assessment     Status: None   Collection Time: 05/07/16  1:22 PM  Result Value Ref Range Status   Specimen Description SPUTUM  Final   Special Requests Immunocompromised  Final   Sputum evaluation THIS SPECIMEN IS ACCEPTABLE FOR SPUTUM CULTURE  Final   Report Status 05/07/2016 FINAL  Final  Culture, respiratory (NON-Expectorated)     Status: None   Collection Time: 05/07/16  1:22 PM  Result Value Ref Range Status   Specimen Description SPUTUM  Final   Special Requests Immunocompromised Reflexed from [id number]  Final   Gram Stain   Final    ABUNDANT WBC PRESENT, PREDOMINANTLY PMN MODERATE YEAST MODERATE GRAM POSITIVE RODS Performed at [facility], [address], [place], [place] [address]    Culture MODERATE STAPHYLOCOCCUS AUREUS  Final   Report Status 05/10/2016 FINAL  Final   Organism ID, Bacteria STAPHYLOCOCCUS AUREUS  Final      Susceptibility   Staphylococcus aureus - MIC*    CIPROFLOXACIN <=0.5 SENSITIVE Sensitive     ERYTHROMYCIN >=8 RESISTANT Resistant     GENTAMICIN <=0.5 SENSITIVE Sensitive     OXACILLIN 0.5 SENSITIVE Sensitive     TETRACYCLINE >=16 RESISTANT Resistant     VANCOMYCIN 1 SENSITIVE Sensitive     TRIMETH/SULFA <=10 SENSITIVE Sensitive     CLINDAMYCIN >=8 RESISTANT Resistant     RIFAMPIN <=0.5 SENSITIVE Sensitive     Inducible Clindamycin NEGATIVE Sensitive     * MODERATE STAPHYLOCOCCUS AUREUS  Culture, blood (Routine X 2) w Reflex to ID Panel     Status: Abnormal   Collection Time: 05/08/16  6:25 AM  Result Value Ref Range Status   Specimen Description BLOOD LEFT ARM  Final   Special Requests IN PEDIATRIC BOTTLE 2CC  Final   Culture  Setup Time   Final    GRAM POSITIVE COCCI IN CLUSTERS AEROBIC BOTTLE ONLY CRITICAL VALUE NOTED.  VALUE IS CONSISTENT WITH PREVIOUSLY REPORTED AND CALLED VALUE.    Culture (A)  Final    STAPHYLOCOCCUS AUREUS SUSCEPTIBILITIES PERFORMED ON PREVIOUS  CULTURE WITHIN THE LAST 5 DAYS. Performed at [facility], [address], [place], [place] [address]    Report Status 05/11/2016 FINAL  Final  Culture, blood (routine x 2)     Status: Abnormal   Collection Time: 05/08/16  5:59 PM  Result Value Ref Range Status   Specimen Description BLOOD LEFT HAND  Final   Special Requests IN PEDIATRIC BOTTLE 1 CC  Final   Culture  Setup Time   Final    GRAM POSITIVE COCCI IN CLUSTERS IN PEDIATRIC BOTTLE CRITICAL RESULT CALLED TO, READ BACK BY AND VERIFIED WITH: PHARMD J GADHIA [id number] MLM    Culture (A)  Final    STAPHYLOCOCCUS AUREUS SUSCEPTIBILITIES PERFORMED ON PREVIOUS CULTURE WITHIN THE LAST 5 DAYS. Performed at [facility], [address], [place], [place] [address]    Report Status 05/11/2016 FINAL  Final  Culture, blood (routine x 2)     Status: Abnormal   Collection Time: 05/08/16  5:59 PM  Result Value Ref Range Status   Specimen Description BLOOD LEFT ARM  Final   Special Requests IN PEDIATRIC BOTTLE 1.5CC  Final   Culture  Setup Time   Final    GRAM POSITIVE COCCI IN CLUSTERS AEROBIC BOTTLE ONLY CRITICAL VALUE NOTED.  VALUE IS CONSISTENT WITH PREVIOUSLY REPORTED AND CALLED VALUE.    Culture (A)  Final    STAPHYLOCOCCUS AUREUS SUSCEPTIBILITIES PERFORMED ON PREVIOUS CULTURE WITHIN THE LAST 5 DAYS. Performed at [facility], [address], [place], [place] [address]    Report Status 05/11/2016 FINAL  Final  Culture, blood (Routine X 2) w Reflex to ID Panel     Status: Abnormal   Collection Time: 05/08/16  9:49 PM  Result Value Ref Range Status   Specimen Description BLOOD LEFT HAND  Final   Special Requests IN PEDIATRIC BOTTLE 0.5CC  Final   Culture  Setup Time   Final    GRAM POSITIVE COCCI IN CLUSTERS IN PEDIATRIC BOTTLE CRITICAL RESULT CALLED TO, READ BACK BY AND VERIFIED WITH: BETH GREEN,PHARMD @2339  002/05/18 MKELLY,MLT Performed at [facility], [address], [place], [place] [address]     Culture METHICILLIN RESISTANT STAPHYLOCOCCUS AUREUS (A)  Final   Report Status 05/11/2016 FINAL  Final   Organism ID, Bacteria METHICILLIN RESISTANT STAPHYLOCOCCUS AUREUS  Final      Susceptibility   Methicillin resistant staphylococcus aureus - MIC*    CIPROFLOXACIN <=0.5 SENSITIVE Sensitive     ERYTHROMYCIN >=8 RESISTANT Resistant     GENTAMICIN <=0.5 SENSITIVE Sensitive     OXACILLIN >=4 RESISTANT Resistant     TETRACYCLINE >=16 RESISTANT Resistant     VANCOMYCIN <=0.5 SENSITIVE Sensitive     TRIMETH/SULFA <=10 SENSITIVE Sensitive     CLINDAMYCIN >=8 RESISTANT Resistant     RIFAMPIN <=0.5 SENSITIVE Sensitive     Inducible Clindamycin NEGATIVE Sensitive     * METHICILLIN RESISTANT STAPHYLOCOCCUS AUREUS  Acid Fast Smear (AFB)     Status: None   Collection Time: 05/10/16  6:09 AM  Result Value Ref Range Status   AFB Specimen Processing Concentration  Final   Acid Fast Smear Negative  Final    Comment: (NOTE) Performed At: [facility] [address] [place], [place] [id number] Mila Homer MD WG:[id number]    Source (AFB) SPUTUM  Final  Culture, blood (routine x 2)     Status: Abnormal   Collection Time: 05/10/16 11:18 AM  Result Value Ref Range Status   Specimen Description BLOOD RIGHT ARM  Final   Special Requests IN PEDIATRIC BOTTLE 3CC  Final   Culture  Setup Time   Final    GRAM POSITIVE COCCI IN CLUSTERS IN PEDIATRIC BOTTLE CRITICAL VALUE NOTED.  VALUE IS CONSISTENT WITH PREVIOUSLY REPORTED AND CALLED VALUE.    Culture (A)  Final    STAPHYLOCOCCUS AUREUS SUSCEPTIBILITIES PERFORMED ON PREVIOUS CULTURE WITHIN THE LAST 5 DAYS. Performed at [facility], [address] [place], [place] [address]    Report Status 05/12/2016 FINAL  Final         Radiology Studies: No results found.    Scheduled Meds: . albuterol  2.5 mg Nebulization TID  . apixaban  5 mg Oral BID  . benzonatate  200 mg Oral TID  . chlorhexidine  15 mL Mouth Rinse BID  .  chlorpheniramine-HYDROcodone  5 mL Oral Q12H  . DAPTOmycin (CUBICIN)  IV  350 mg Intravenous Q24H  . dextromethorphan  30 mg Oral BID  . feeding supplement  1 Container Oral BID BM  . feeding supplement (ENSURE ENLIVE)  237 mL Oral BID BM  . HYDROcodone-acetaminophen  2 tablet Oral Q4H  . mouth rinse  15 mL Mouth Rinse q12n4p  . multivitamin with minerals  1 tablet Oral Daily  . sodium chloride flush  3 mL Intravenous Q12H   Continuous Infusions:   LOS: 8 days    Time spent in minutes: 35    Arshia Rondon, MD [facility] Pager: [email or web address] Password [facility] 05/14/2016, 9:43 AM

## 2016-05-14 NOTE — Progress Notes (Signed)
Assumed care of patient at . Agree with previous Nurse assessment.  Merl Guardino M. Teryn Gust, RN  

## 2016-05-15 ENCOUNTER — Inpatient Hospital Stay (HOSPITAL_COMMUNITY): Payer: Medicaid Other

## 2016-05-15 DIAGNOSIS — J15211 Pneumonia due to Methicillin susceptible Staphylococcus aureus: Secondary | ICD-10-CM

## 2016-05-15 DIAGNOSIS — N170 Acute kidney failure with tubular necrosis: Secondary | ICD-10-CM

## 2016-05-15 DIAGNOSIS — Z86711 Personal history of pulmonary embolism: Secondary | ICD-10-CM

## 2016-05-15 DIAGNOSIS — R071 Chest pain on breathing: Secondary | ICD-10-CM

## 2016-05-15 LAB — BASIC METABOLIC PANEL
Anion gap: 7 (ref 5–15)
BUN: 16 mg/dL (ref 6–20)
CO2: 16 mmol/L — AB (ref 22–32)
Calcium: 8 mg/dL — ABNORMAL LOW (ref 8.9–10.3)
Chloride: 111 mmol/L (ref 101–111)
Creatinine, Ser: 1.05 mg/dL — ABNORMAL HIGH (ref 0.44–1.00)
GFR calc Af Amer: >60 mL/min (ref >60)
GFR calc non Af Amer: >60 mL/min (ref >60)
Glucose, Bld: 125 mg/dL — ABNORMAL HIGH (ref 65–99)
POTASSIUM: 4.3 mmol/L (ref 3.5–5.1)
SODIUM: 134 mmol/L — AB (ref 135–145)

## 2016-05-15 LAB — BLOOD GAS, ARTERIAL
ACID-BASE DEFICIT: 7.2 mmol/L — AB (ref 0.0–2.0)
BICARBONATE: 16.6 mmol/L — AB (ref 20.0–28.0)
Drawn by: 103701
FIO2: 21
O2 SAT: 91.9 %
PATIENT TEMPERATURE: 99.1
pCO2 arterial: 28.9 mmHg — ABNORMAL LOW (ref 32.0–48.0)
pH, Arterial: 7.38 (ref 7.350–7.450)
pO2, Arterial: 71.3 mmHg — ABNORMAL LOW (ref 83.0–108.0)

## 2016-05-15 LAB — CBC
HCT: 24.2 % — ABNORMAL LOW (ref 36.0–46.0)
Hemoglobin: 7.6 g/dL — ABNORMAL LOW (ref 12.0–15.0)
MCH: 26.9 pg (ref 26.0–34.0)
MCHC: 31.4 g/dL (ref 30.0–36.0)
MCV: 85.5 fL (ref 78.0–100.0)
Platelets: 180 10*3/uL (ref 150–400)
RBC: 2.83 MIL/uL — ABNORMAL LOW (ref 3.87–5.11)
RDW: 19.2 % — ABNORMAL HIGH (ref 11.5–15.5)
WBC: 8.6 10*3/uL (ref 4.0–10.5)

## 2016-05-15 LAB — GLUCOSE, CAPILLARY: Glucose-Capillary: 82 mg/dL (ref 65–99)

## 2016-05-15 LAB — PREPARE RBC (CROSSMATCH)

## 2016-05-15 MED ORDER — METOPROLOL TARTRATE 5 MG/5ML IV SOLN
2.5000 mg | Freq: Once | INTRAVENOUS | Status: AC
  Administered 2016-05-15: 2.5 mg via INTRAVENOUS
  Filled 2016-05-15: qty 5

## 2016-05-15 MED ORDER — KETOROLAC TROMETHAMINE 15 MG/ML IJ SOLN
15.0000 mg | Freq: Four times a day (QID) | INTRAMUSCULAR | Status: DC
  Administered 2016-05-15 – 2016-05-16 (×3): 15 mg via INTRAVENOUS
  Filled 2016-05-15 (×3): qty 1

## 2016-05-15 MED ORDER — OXYCODONE HCL 5 MG PO TABS: 5.0000 mg | ORAL_TABLET | ORAL | Status: DC | PRN

## 2016-05-15 MED ORDER — OXYCODONE HCL ER 20 MG PO T12A
20.0000 mg | EXTENDED_RELEASE_TABLET | Freq: Two times a day (BID) | ORAL | Status: DC
  Administered 2016-05-15 (×2): 20 mg via ORAL
  Filled 2016-05-15 (×2): qty 1

## 2016-05-15 MED ORDER — SODIUM CHLORIDE 0.9 % IV SOLN
Freq: Once | INTRAVENOUS | Status: AC
  Administered 2016-05-15: 11:00:00 via INTRAVENOUS

## 2016-05-15 MED ORDER — LINEZOLID 600 MG/300ML IV SOLN
600.0000 mg | Freq: Two times a day (BID) | INTRAVENOUS | Status: DC
  Administered 2016-05-15 – 2016-05-25 (×21): 600 mg via INTRAVENOUS
  Filled 2016-05-15 (×21): qty 300

## 2016-05-15 MED ORDER — ENSURE ENLIVE PO LIQD
237.0000 mL | Freq: Three times a day (TID) | ORAL | Status: DC
  Administered 2016-05-15 – 2016-05-16 (×4): 237 mL via ORAL

## 2016-05-15 MED ORDER — SODIUM CHLORIDE 0.9 % IV BOLUS (SEPSIS)
500.0000 mL | Freq: Once | INTRAVENOUS | Status: AC
Start: 2016-05-15 — End: 2016-05-15
  Administered 2016-05-15: 500 mL via INTRAVENOUS

## 2016-05-15 MED ORDER — BISACODYL 10 MG RE SUPP
10.0000 mg | Freq: Every day | RECTAL | Status: DC | PRN
Start: 2016-05-15 — End: 2016-05-28
  Administered 2016-05-22: 10 mg via RECTAL
  Filled 2016-05-15: qty 1

## 2016-05-15 NOTE — Progress Notes (Signed)
PROGRESS NOTE    Catherine Hernandez   WJX:[id number]  DOB: [birth year]  DOA: 05/26/2016 PCP: No PCP Per Patient   Brief Narrative:  31 y.o.femalewith medical history significant for drug abuse with heroin, cocaine, tobacco, asthma endocarditis involving tricuspid valve and nonadherence with her treatment plan now presenting to the emergency department with generalized aches and pains, malaise, productive cough and fatigue.   She has had a complicated recent medical course involving hospitalization in December 2017 with Serratia bacteremia, bilateral pulmonary emboli and tricuspid valve endocarditis. She was evaluated by CT surgery at that time and deemed to not be a surgical candidate.She was treated with antibiotics in the hospital and discharged to complete a course of Cipro.   She returned to the hospital with the starting of January 2018 with sepsis secondary to HCAP and noted to have cavitary pulmonary lesions. She was treated with empiric vancomycin and cefepime in the hospital. Per ID, she was not a PICC candidate due to IV drug abuse and was discharged with 1 month of Bactrim which she never filled.   She returns to the hospital and found to be afebrile chest x-ray showed patchy bilateral air space opacities likely representing multifocal pneumonia #2 septic emboli or asymmetric edema. CT of the cervical spine notable for opacities and nodules in the lung apices with cavitation Blood Cx with MRSA.  Subjective: Coughing improving- severe chest pain better controlled on medications. Eating better today.   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 pneumonia with septic embolization -seen by CVTS in the past for  Tricuspid vegetation- not a surgical candidate due to overall malnourished condition and dental caries- -  Vegetation increasing in size with severe regurgitation noted on Echo on 05/08/16 -Patient was not a PICC line candidate at recent discharge due to ongoing IV drug abuse and states she didn't fill script for bactrim due to cost issues - currently on Daptomycin- will need aggressive care if she is going to survive this- have spoken with social work about NH placement for her- patient is agreeable however as she left AMA from SNF last time, she is no longer a candidate to go to SNF - will keep in hospital for IV antibiotics for the duration of the treatment  Acute kidney injury - patient baseline creatinine 0.6 in January 2018, admitted with creatinine of 2.91. - creatinine improving steadily-  - no hydronephrosis noted on Renal US  Hyponatremia - SIADH?- checked U osm and sodium and S osm- they are consistent with SIADH- barely drinking fluids therefore already fluid restricted  Severe malnutrition - ensure and boost ordered  Anemia  - of acute illness - transfused 1 U PRBC   but IV leaked prior to bag finishing- she received about 2/3 of it-  Hb still low- will give another Unit of blood today - anemia panel reveals normal Iron and Folate levels - B12 is low normal- will replace with s/c 1000 mcg x 3 days  Hypokalemia- -replaced  Thrombocytopenia- - acute since 2/3 likely due to sepsis and improving  Bilateral Pulmonary embolism -- patient was diagnosed with bilateral lower lobe PEs in December 2017 and has been on Eliquis    Polysubstance abuse - patient has ongoing IV drug abuse -  UDS positive for cocaine, opiates, benzodiazepines.    DVT prophylaxis:  Apixaban  Procedures: 2 D ECHO  When compared to the prior study from 03/21/2016 the vegetation   on the tricuspid valve appears larger, now measuring 22 x 16 mm.   There is severe tricuspid regurgitation and severe  pulmonary   hypertension.   There is no vegetation seen on the aortic, mitral or pulmonic   valves. Antimicrobials:  Anti-infectives    Start     Dose/Rate Route Frequency Ordered Stop   05/11/16 1800  DAPTOmycin (CUBICIN) 350 mg in sodium chloride 0.9 % IVPB     350 mg 214 mL/hr over 30 Minutes Intravenous Every 24 hours 05/11/16 1730     05/09/16 2200  vancomycin (VANCOCIN) IVPB 750 mg/150 ml premix  Status:  Discontinued     750 mg 150 mL/hr over 60 Minutes Intravenous Every 24 hours 05/09/16 2143 05/11/16 1708   05/07/16 2000  vancomycin (VANCOCIN) 500 mg in sodium chloride 0.9 % 100 mL IVPB  Status:  Discontinued     500 mg 100 mL/hr over 60 Minutes Intravenous Every 24 hours 05/07/16 1824 05/09/16 2143   05/07/16 2000  ceFEPIme (MAXIPIME) 1 g in dextrose 5 % 50 mL IVPB  Status:  Discontinued     1 g 100 mL/hr over 30 Minutes Intravenous Every 24 hours 05/07/16 1824 05/07/16 1546   05/07/16 1800  ceFEPIme (MAXIPIME) 2 g in dextrose 5 % 50 mL IVPB     2 g 100 mL/hr over 30 Minutes Intravenous  Once 2016-05-07 1754 2016-05-07 1944   May 07, 2016 1800  vancomycin (VANCOCIN) IVPB 1000 mg/200 mL premix     1,000 mg 200 mL/hr over 60 Minutes Intravenous  Once 2016/05/07 1754 05-07-16 2030       Objective: Vitals:   05/14/16 1952 05/14/16 2157 05/15/16 0059 05/15/16 0519  BP:  117/80 123/80 123/76  Pulse:  (!) 122  (!) 125  Resp:  18 20 20   Temp:  99.3 F (37.4 C) 99.4 F (37.4 C) 98.5 F (36.9 C)  TempSrc:  Oral Oral Oral  SpO2: 94% 96% 97% 96%  Weight:      Height:        Intake/Output Summary (Last 24 hours) at 05/15/16 1105 Last data filed at 05/15/16 0900  Gross per 24 hour  Intake             1307 ml  Output             1101 ml  Net              206 ml   Filed Weights   05/12/16 0451 05/13/16 0651 05/14/16 0420  Weight: 43.1 kg (95 lb 0.3 oz) 42.9 kg (94 lb 9.2 oz) 41.6 kg (91 lb 11.4 oz)    Examination: General exam: Appears very uncomfortable, cachectic  HEENT:  PERRLA, oral mucosa moist, no sclera icterus or thrush Respiratory system: Clear to auscultation. Respiratory effort normal.- severe cough Cardiovascular system: S1 & S2 heard, RRR.  No murmurs  Gastrointestinal system: Abdomen soft, non-tender, nondistended. Normal bowel sound. No organomegaly Central nervous system: Alert and oriented. No focal neurological deficits. Extremities: No cyanosis, clubbing or edema Skin: No rashes or ulcers Psychiatry:  Mood & affect appropriate.     Data Reviewed: I have personally reviewed following labs and imaging studies  CBC:  Recent Labs Lab 05/11/16 0545 05/12/16 0540 05/13/16 0514 05/14/16 1110 05/15/16 0459  WBC 13.2* 14.9* 11.0* 10.1 8.6  HGB 7.9* 7.2* 7.4* 7.8* 7.6*  HCT 24.2* 22.6* 22.9* 24.3* 24.2*  MCV 83.2 84.0 84.2 84.4 85.5  PLT 93* 109* 113* 174 180   Basic Metabolic Panel:  Recent Labs Lab 05/09/16 0528 05/11/16 0545 05/12/16 0540 05/13/16 0514 05/15/16 0459  NA 132*  --  133* 134* 134*  K 4.2  --  4.4 4.3 4.3  CL 105  --  108 110 111  CO2 16*  --  18* 17* 16*  GLUCOSE 97  --  101* 107* 125*  BUN 46*  --  27* 21* 16  CREATININE 2.18* 1.50* 1.31* 1.23* 1.05*  CALCIUM 7.4*  --  7.5* 7.6* 8.0*   GFR: Estimated Creatinine Clearance: 52.4 mL/min (by C-G formula based on SCr of 1.05 mg/dL (H)). Liver Function Tests: No results for input(s): AST, ALT, ALKPHOS, BILITOT, PROT, ALBUMIN in the last 168 hours. No results for input(s): LIPASE, AMYLASE in the last 168 hours. No results for input(s): AMMONIA in the last 168 hours. Coagulation Profile: No results for input(s): INR, PROTIME in the last 168 hours. Cardiac Enzymes:  Recent Labs Lab 05/12/16 0540  CKTOTAL 7*   BNP (last 3 results) No results for input(s): PROBNP in the last 8760 hours. HbA1C: No results for input(s): HGBA1C in the last 72 hours. CBG:  Recent Labs Lab 05/11/16 0757 05/12/16 0733 05/13/16 0737 05/14/16 0759 05/15/16 0733  GLUCAP  119* 114* 243* 104* 82   Lipid Profile: No results for input(s): CHOL, HDL, LDLCALC, TRIG, CHOLHDL, LDLDIRECT in the last 72 hours. Thyroid Function Tests: No results for input(s): TSH, T4TOTAL, FREET4, T3FREE, THYROIDAB in the last 72 hours. Anemia Panel:  Recent Labs  05/13/16 1309 05/14/16 1110  VITAMINB12  --  797  FOLATE  --  17.3  FERRITIN  --  632*  TIBC  --  140*  IRON  --  17*  RETICCTPCT 1.7  --    Urine analysis:    Component Value Date/Time   COLORURINE YELLOW 05/11/2016 1527   APPEARANCEUR HAZY (A) 05/05/2016 1527   LABSPEC 1.010 05/17/2016 1527   PHURINE 5.0 05/18/2016 1527   GLUCOSEU NEGATIVE 05/27/2016 1527   HGBUR LARGE (A) 05/27/2016 1527   BILIRUBINUR NEGATIVE 05/20/2016 1527   KETONESUR NEGATIVE 05/20/2016 1527   PROTEINUR 30 (A) 05/17/2016 1527   UROBILINOGEN 0.2 01/07/2012 1140   NITRITE NEGATIVE 05/12/2016 1527   LEUKOCYTESUR SMALL (A) 05/17/2016 1527   Sepsis Labs: @LABRCNTIP (procalcitonin:4,lacticidven:4) ) Recent Results (from the past 240 hour(s))  Urine culture     Status: Abnormal   Collection Time: 05/18/2016  3:27 PM  Result Value Ref Range Status   Specimen Description URINE, RANDOM  Final   Special Requests NONE  Final   Culture (A)  Final    <10,000 COLONIES/mL INSIGNIFICANT GROWTH Performed at [facility], [address] [place], [place] [address]    Report Status 05/08/2016 FINAL  Final  Blood culture (routine x 2)     Status: Abnormal   Collection Time: 05/14/2016  5:35 PM  Result Value Ref Range Status   Specimen Description BLOOD LEFT ARM  Final   Special Requests IN PEDIATRIC BOTTLE 2CC  Final   Culture  Setup Time   Final    GRAM POSITIVE COCCI IN CLUSTERS IN PEDIATRIC BOTTLE CRITICAL RESULT CALLED TO, READ BACK BY AND VERIFIED WITH: T. GREEN PHARMD, AT 1191 05/07/16 BY Renato Shin Performed at [facility], [address], [place], [place] [address]  Culture METHICILLIN RESISTANT STAPHYLOCOCCUS AUREUS (A)   Final   Report Status 05/09/2016 FINAL  Final   Organism ID, Bacteria METHICILLIN RESISTANT STAPHYLOCOCCUS AUREUS  Final      Susceptibility   Methicillin resistant staphylococcus aureus - MIC*    CIPROFLOXACIN <=0.5 SENSITIVE Sensitive     ERYTHROMYCIN >=8 RESISTANT Resistant     GENTAMICIN <=0.5 SENSITIVE Sensitive     OXACILLIN >=4 RESISTANT Resistant     TETRACYCLINE >=16 RESISTANT Resistant     VANCOMYCIN 1 SENSITIVE Sensitive     TRIMETH/SULFA <=10 SENSITIVE Sensitive     CLINDAMYCIN >=8 RESISTANT Resistant     RIFAMPIN <=0.5 SENSITIVE Sensitive     Inducible Clindamycin NEGATIVE Sensitive     * METHICILLIN RESISTANT STAPHYLOCOCCUS AUREUS  Blood Culture ID Panel (Reflexed)     Status: Abnormal   Collection Time: May 27, 2016  5:35 PM  Result Value Ref Range Status   Enterococcus species NOT DETECTED NOT DETECTED Final   Listeria monocytogenes NOT DETECTED NOT DETECTED Final   Staphylococcus species DETECTED (A) NOT DETECTED Final    Comment: CRITICAL RESULT CALLED TO, READ BACK BY AND VERIFIED WITH: T. GREEN PHARMD, AT 1610 05/07/16 BY D. VANHOOK    Staphylococcus aureus DETECTED (A) NOT DETECTED Final    Comment: Methicillin (oxacillin)-resistant Staphylococcus aureus (MRSA). MRSA is predictably resistant to beta-lactam antibiotics (except ceftaroline). Preferred therapy is vancomycin unless clinically contraindicated. Patient requires contact precautions if  hospitalized. CRITICAL RESULT CALLED TO, READ BACK BY AND VERIFIED WITH: T. GREEN PHARMD, AT 9604 05/07/16 BY D. VANHOOK    Methicillin resistance DETECTED (A) NOT DETECTED Final    Comment: CRITICAL RESULT CALLED TO, READ BACK BY AND VERIFIED WITH: T. GREEN PHARMD, AT 5409 05/07/16 BY D. VANHOOK    Streptococcus species NOT DETECTED NOT DETECTED Final   Streptococcus agalactiae NOT DETECTED NOT DETECTED Final   Streptococcus pneumoniae NOT DETECTED NOT DETECTED Final   Streptococcus pyogenes NOT DETECTED NOT DETECTED Final    Acinetobacter baumannii NOT DETECTED NOT DETECTED Final   Enterobacteriaceae species NOT DETECTED NOT DETECTED Final   Enterobacter cloacae complex NOT DETECTED NOT DETECTED Final   Escherichia coli NOT DETECTED NOT DETECTED Final   Klebsiella oxytoca NOT DETECTED NOT DETECTED Final   Klebsiella pneumoniae NOT DETECTED NOT DETECTED Final   Proteus species NOT DETECTED NOT DETECTED Final   Serratia marcescens NOT DETECTED NOT DETECTED Final   Haemophilus influenzae NOT DETECTED NOT DETECTED Final   Neisseria meningitidis NOT DETECTED NOT DETECTED Final   Pseudomonas aeruginosa NOT DETECTED NOT DETECTED Final   Candida albicans NOT DETECTED NOT DETECTED Final   Candida glabrata NOT DETECTED NOT DETECTED Final   Candida krusei NOT DETECTED NOT DETECTED Final   Candida parapsilosis NOT DETECTED NOT DETECTED Final   Candida tropicalis NOT DETECTED NOT DETECTED Final    Comment: Performed at [facility], [address] [place], [place] [address]  Blood culture (routine x 2)     Status: None   Collection Time: 05-27-2016  6:18 PM  Result Value Ref Range Status   Specimen Description BLOOD RIGHT NECK  Final   Special Requests IN PEDIATRIC BOTTLE 2CC  Final   Culture   Final    NO GROWTH 5 DAYS Performed at [facility], [address] [place], [place] [address]    Report Status 05/11/2016 FINAL  Final  MRSA PCR Screening     Status: None   Collection Time: 05/07/16 12:57 AM  Result Value  Ref Range Status   MRSA by PCR NEGATIVE NEGATIVE Final    Comment:        The GeneXpert MRSA Assay (FDA approved for NASAL specimens only), is one component of a comprehensive MRSA colonization surveillance program. It is not intended to diagnose MRSA infection nor to guide or monitor treatment for MRSA infections.   Acid Fast Smear (AFB)     Status: None   Collection Time: 05/07/16  1:20 PM  Result Value Ref Range Status   AFB Specimen Processing Concentration  Final   Acid Fast  Smear Negative  Final    Comment: (NOTE) Performed At: [facility] [address] [place], [place] [id number] Mila HomerHancock William F MD WU:[id number]:[phone]    Source (AFB) SPUTUM  Final  Culture, sputum-assessment     Status: None   Collection Time: 05/07/16  1:22 PM  Result Value Ref Range Status   Specimen Description SPUTUM  Final   Special Requests Immunocompromised  Final   Sputum evaluation THIS SPECIMEN IS ACCEPTABLE FOR SPUTUM CULTURE  Final   Report Status 05/07/2016 FINAL  Final  Culture, respiratory (NON-Expectorated)     Status: None   Collection Time: 05/07/16  1:22 PM  Result Value Ref Range Status   Specimen Description SPUTUM  Final   Special Requests Immunocompromised Reflexed from [id number]  Final   Gram Stain   Final    ABUNDANT WBC PRESENT, PREDOMINANTLY PMN MODERATE YEAST MODERATE GRAM POSITIVE RODS Performed at [facility], [address] [place], [place] [address]    Culture MODERATE STAPHYLOCOCCUS AUREUS  Final   Report Status 05/10/2016 FINAL  Final   Organism ID, Bacteria STAPHYLOCOCCUS AUREUS  Final      Susceptibility   Staphylococcus aureus - MIC*    CIPROFLOXACIN <=0.5 SENSITIVE Sensitive     ERYTHROMYCIN >=8 RESISTANT Resistant     GENTAMICIN <=0.5 SENSITIVE Sensitive     OXACILLIN 0.5 SENSITIVE Sensitive     TETRACYCLINE >=16 RESISTANT Resistant     VANCOMYCIN 1 SENSITIVE Sensitive     TRIMETH/SULFA <=10 SENSITIVE Sensitive     CLINDAMYCIN >=8 RESISTANT Resistant     RIFAMPIN <=0.5 SENSITIVE Sensitive     Inducible Clindamycin NEGATIVE Sensitive     * MODERATE STAPHYLOCOCCUS AUREUS  Culture, blood (Routine X 2) w Reflex to ID Panel     Status: Abnormal   Collection Time: 05/08/16  6:25 AM  Result Value Ref Range Status   Specimen Description BLOOD LEFT ARM  Final   Special Requests IN PEDIATRIC BOTTLE 2CC  Final   Culture  Setup Time   Final    GRAM POSITIVE COCCI IN CLUSTERS AEROBIC BOTTLE ONLY CRITICAL VALUE NOTED.  VALUE IS  CONSISTENT WITH PREVIOUSLY REPORTED AND CALLED VALUE.    Culture (A)  Final    STAPHYLOCOCCUS AUREUS SUSCEPTIBILITIES PERFORMED ON PREVIOUS CULTURE WITHIN THE LAST 5 DAYS. Performed at [facility], [address] [place], [place] [address]    Report Status 05/11/2016 FINAL  Final  Culture, blood (routine x 2)     Status: Abnormal   Collection Time: 05/08/16  5:59 PM  Result Value Ref Range Status   Specimen Description BLOOD LEFT HAND  Final   Special Requests IN PEDIATRIC BOTTLE 1 CC  Final   Culture  Setup Time   Final    GRAM POSITIVE COCCI IN CLUSTERS IN PEDIATRIC BOTTLE CRITICAL RESULT CALLED TO, READ BACK BY AND VERIFIED WITH: PHARMD J GADHIA [id number] MLM  Culture (A)  Final    STAPHYLOCOCCUS AUREUS SUSCEPTIBILITIES PERFORMED ON PREVIOUS CULTURE WITHIN THE LAST 5 DAYS. Performed at [facility], [address], [place], [place] [address]    Report Status 05/11/2016 FINAL  Final  Culture, blood (routine x 2)     Status: Abnormal   Collection Time: 05/08/16  5:59 PM  Result Value Ref Range Status   Specimen Description BLOOD LEFT ARM  Final   Special Requests IN PEDIATRIC BOTTLE 1.5CC  Final   Culture  Setup Time   Final    GRAM POSITIVE COCCI IN CLUSTERS AEROBIC BOTTLE ONLY CRITICAL VALUE NOTED.  VALUE IS CONSISTENT WITH PREVIOUSLY REPORTED AND CALLED VALUE.    Culture (A)  Final    STAPHYLOCOCCUS AUREUS SUSCEPTIBILITIES PERFORMED ON PREVIOUS CULTURE WITHIN THE LAST 5 DAYS. Performed at [facility], [address], [place], [place] [address]    Report Status 05/11/2016 FINAL  Final  Culture, blood (Routine X 2) w Reflex to ID Panel     Status: Abnormal   Collection Time: 05/08/16  9:49 PM  Result Value Ref Range Status   Specimen Description BLOOD LEFT HAND  Final   Special Requests IN PEDIATRIC BOTTLE 0.5CC  Final   Culture  Setup Time   Final    GRAM POSITIVE COCCI IN CLUSTERS IN PEDIATRIC BOTTLE CRITICAL RESULT CALLED TO, READ BACK  BY AND VERIFIED WITH: BETH GREEN,PHARMD @2339  002/05/18 MKELLY,MLT Performed at [facility], [address], [place], [place] [address]    Culture METHICILLIN RESISTANT STAPHYLOCOCCUS AUREUS (A)  Final   Report Status 05/11/2016 FINAL  Final   Organism ID, Bacteria METHICILLIN RESISTANT STAPHYLOCOCCUS AUREUS  Final      Susceptibility   Methicillin resistant staphylococcus aureus - MIC*    CIPROFLOXACIN <=0.5 SENSITIVE Sensitive     ERYTHROMYCIN >=8 RESISTANT Resistant     GENTAMICIN <=0.5 SENSITIVE Sensitive     OXACILLIN >=4 RESISTANT Resistant     TETRACYCLINE >=16 RESISTANT Resistant     VANCOMYCIN <=0.5 SENSITIVE Sensitive     TRIMETH/SULFA <=10 SENSITIVE Sensitive     CLINDAMYCIN >=8 RESISTANT Resistant     RIFAMPIN <=0.5 SENSITIVE Sensitive     Inducible Clindamycin NEGATIVE Sensitive     * METHICILLIN RESISTANT STAPHYLOCOCCUS AUREUS  Acid Fast Smear (AFB)     Status: None   Collection Time: 05/10/16  6:09 AM  Result Value Ref Range Status   AFB Specimen Processing Concentration  Final   Acid Fast Smear Negative  Final    Comment: (NOTE) Performed At: [facility] [address] [place], [place] [id number] Mila Homer MD OZ:[id number]    Source (AFB) SPUTUM  Final  Culture, blood (routine x 2)     Status: Abnormal   Collection Time: 05/10/16 11:18 AM  Result Value Ref Range Status   Specimen Description BLOOD RIGHT ARM  Final   Special Requests IN PEDIATRIC BOTTLE 3CC  Final   Culture  Setup Time   Final    GRAM POSITIVE COCCI IN CLUSTERS IN PEDIATRIC BOTTLE CRITICAL VALUE NOTED.  VALUE IS CONSISTENT WITH PREVIOUSLY REPORTED AND CALLED VALUE.    Culture (A)  Final    STAPHYLOCOCCUS AUREUS SUSCEPTIBILITIES PERFORMED ON PREVIOUS CULTURE WITHIN THE LAST 5 DAYS. Performed at [facility], [address] [place], [place] [address]    Report Status 05/12/2016 FINAL  Final  Culture, blood (routine x 2)     Status: None (Preliminary result)  Collection Time: 05/13/16 10:46 AM  Result Value Ref Range Status   Specimen Description BLOOD LEFT ARM  Final   Special Requests IN PEDIATRIC BOTTLE 1CC  Final   Culture   Final    NO GROWTH 2 DAYS Performed at [facility], [address] [place], [place] [address]    Report Status PENDING  Incomplete  Culture, blood (routine x 2)     Status: None (Preliminary result)   Collection Time: 05/13/16 10:49 AM  Result Value Ref Range Status   Specimen Description BLOOD RIGHT HAND  Final   Special Requests IN PEDIATRIC BOTTLE 3CC  Final   Culture   Final    NO GROWTH 2 DAYS Performed at [facility], [address], [place], [place] [address]    Report Status PENDING  Incomplete         Radiology Studies: No results found.    Scheduled Meds: . sodium chloride   Intravenous Once  . apixaban  5 mg Oral BID  . benzonatate  200 mg Oral TID  . chlorhexidine  15 mL Mouth Rinse BID  . chlorpheniramine-HYDROcodone  5 mL Oral Q12H  . DAPTOmycin (CUBICIN)  IV  350 mg Intravenous Q24H  . dextromethorphan  30 mg Oral BID  . feeding supplement  1 Container Oral BID BM  . feeding supplement (ENSURE ENLIVE)  237 mL Oral TID BM  . mouth rinse  15 mL Mouth Rinse q12n4p  . multivitamin with minerals  1 tablet Oral Daily  . oxyCODONE  20 mg Oral Q12H  . sodium chloride flush  3 mL Intravenous Q12H  . vitamin B-12  1,000 mcg Oral Daily   Continuous Infusions:   LOS: 9 days    Time spent in minutes: 35    Giana Castner, MD [facility] Pager: [email or web address] Password [facility] 05/15/2016, 11:05 AM

## 2016-05-15 NOTE — Progress Notes (Signed)
Another message sent to provider that heart rate maintains above 120s.

## 2016-05-15 NOTE — Progress Notes (Signed)
MD Rizwan made aware of patient's increase in temperature from 98.8 to 100.3 after receiving 1 unit of PRBCs. No other signs of reaction present and blood unit was completed. PRN Tylenol given. MD also made aware of patient's increased HR to 130s and increased work of breathing. No new orders.

## 2016-05-15 NOTE — Progress Notes (Addendum)
While pt was having a coughing fit, HR went up to the 140s. Returns to 120s after.   Shortly after this, pt was maintaining a HR above 125. BP stable. Pt states she has been dizzy when standing. ON call coverage Schorr paged and bolus/metoprolol given. Will continue to monitor.

## 2016-05-15 NOTE — Consult Note (Signed)
Name: Catherine Hernandez MRN: [id number] DOB: [birth year]    ADMISSION DATE:  02-Jun-2016 CONSULTATION DATE:  2/12  REFERRING MD :  Butler Denmark   CHIEF COMPLAINT:  Anticoagulation recs   BRIEF PATIENT DESCRIPTION:  13 yof w/ sig h/o IVDA, septic pulmonary emboli and cavitary PNA as well as bilateral PE (diagnosed back in Dec 2017 but there was concern for this being sepsis related). Re-admitted 2/3 w/ progressive TV MRSA endocarditis after being d/c'd 1/7 on oral Bactrim (which she did not take). PCCM has been asked to see to give recs re: anticoagulation.   SIGNIFICANT EVENTS  1/7 discharged on bactrim (did not fill) 2/3 re-admitted      HISTORY OF PRESENT ILLNESS:   31 year old white female w/sig h/o IVDA. Admitted once again for sepsis in setting of MRSA bacteremia and progressive TV endocarditis w/ worsening TR and cavitary PNA in setting of septic emboli. Initially diagnosed in August 2017. Now on her third hospitalization. Continues to abuse IV drugs. She has been deemed a non-operative candidate due to her malnutrition and poor physical state, not a SNF candidate d/t leaving SNF AMA, and not a PICC candidate in the out-pt setting d/t on-going IV drug abuse. She was diagnosed w/ presumed PE back in august but left AMA. F/u imaging by CT scan Mar 22 2016 showed bilateral PE but had "tapered appearance" raising concern for chronic PE vs endovascular event. Pulm   PAST MEDICAL HISTORY :   has a past medical history of Anemia; CAP (community acquired pneumonia) (11/16/2015); Childhood asthma; Endocarditis; Enterococcal bacteremia; Heroin abuse; Hyponatremia; Pneumonia (2007); and Polysubstance abuse.  has a past surgical history that includes No past surgeries and TEE without cardioversion (N/A, 11/23/2015). Prior to Admission medications   Medication Sig Start Date End Date Taking? Authorizing Provider  apixaban (ELIQUIS) 5 MG TABS tablet Take 1 tablet (5 mg total) by mouth every 12 (twelve)  hours. Start on 03/31/16 03/31/16  Yes Ripudeep Jenna Luo, MD  acetaminophen (TYLENOL) 325 MG tablet Take 2 tablets (650 mg total) by mouth every 6 (six) hours as needed for moderate pain. Patient not taking: Reported on June 02, 2016 04/09/16   Alison Murray, MD  ferrous gluconate (FERGON) 324 MG tablet Take 1 tablet (324 mg total) by mouth 3 (three) times daily with meals. Patient not taking: Reported on 04/10/2016 03/28/16   Ripudeep Jenna Luo, MD  folic acid (FOLVITE) 1 MG tablet Take 1 tablet (1 mg total) by mouth daily. Patient not taking: Reported on 04/10/2016 04/10/16   Alison Murray, MD  LORazepam (ATIVAN) 1 MG tablet Take 1 tablet (1 mg total) by mouth every 8 (eight) hours as needed for anxiety. Patient not taking: Reported on 04/10/2016 03/28/16   Ripudeep Jenna Luo, MD  Multiple Vitamin (MULTIVITAMIN WITH MINERALS) TABS tablet Take 1 tablet by mouth daily. Patient not taking: Reported on 04/10/2016 03/29/16   Ripudeep Jenna Luo, MD  sulfamethoxazole-trimethoprim (BACTRIM DS,SEPTRA DS) 800-160 MG tablet Take 1 tablet by mouth every 12 (twelve) hours. Patient not taking: Reported on 04/10/2016 04/09/16   Alison Murray, MD  thiamine 100 MG tablet Take 1 tablet (100 mg total) by mouth daily. Patient not taking: Reported on 04/10/2016 04/10/16   Alison Murray, MD   No Known Allergies  FAMILY HISTORY:  family history is not on file. SOCIAL HISTORY:  reports that she has been smoking Cigarettes.  She has a 5.00 pack-year smoking history. She has never used smokeless tobacco. She reports  that she uses drugs, including IV, Cocaine, Heroin, and Oxycodone. She reports that she does not drink alcohol.  REVIEW OF SYSTEMS:   Constitutional: Negative for fever, chills, weight loss, +fatigue and diaphoresis.  HENT: Negative for hearing loss, ear pain, nosebleeds, congestion, sore throat, neck pain, tinnitus and ear discharge.   Eyes: Negative for blurred vision, double vision, photophobia, pain, discharge and redness.  Respiratory:  + cough, -hemoptysis, -sputum production,+shortness of breath, -wheezing and stridor.   Cardiovascular: +chest pain, palpitations, orthopnea, claudication, leg swelling and PND.  Gastrointestinal: Negative for heartburn, nausea, vomiting, abdominal pain, diarrhea, constipation, blood in stool and melena.  Genitourinary: Negative for dysuria, urgency, frequency, hematuria and flank pain.  Musculoskeletal: Negative for myalgias, back pain, joint pain and falls.  Skin: Negative for itching and rash.  Neurological: Negative for dizziness, tingling, tremors, sensory change, speech change, focal weakness, seizures, loss of consciousness, weakness and headaches.  Endo/Heme/Allergies: Negative for environmental allergies and polydipsia. Does not bruise/bleed easily.  SUBJECTIVE:  Chest hurts  VITAL SIGNS: Temp:  [98.1 F (36.7 C)-99.4 F (37.4 C)] 98.5 F (36.9 C) (02/12 0519) Pulse Rate:  [108-125] 125 (02/12 0519) Resp:  [17-20] 20 (02/12 0519) BP: (115-123)/(76-80) 123/76 (02/12 0519) SpO2:  [94 %-98 %] 96 % (02/12 0519)  PHYSICAL EXAMINATION: General:  Chronically ill appearing white female, rr in 30s c/o chest discomfort.  Neuro:  Awake, oriented. Cooperative but very weak HEENT:  Temporal wasting. Poor dentition MMM Cardiovascular:  Tachy RRR w/ + murmur  Lungs:  Diffuse rhonchi + rr 30s some accessory use  Abdomen:  Soft, not tender + bowel sounds  Musculoskeletal:  Equal st and bulk  Skin:  Warm and dry    Recent Labs Lab 05/12/16 0540 05/13/16 0514 05/15/16 0459  NA 133* 134* 134*  K 4.4 4.3 4.3  CL 108 110 111  CO2 18* 17* 16*  BUN 27* 21* 16  CREATININE 1.31* 1.23* 1.05*  GLUCOSE 101* 107* 125*    Recent Labs Lab 05/13/16 0514 05/14/16 1110 05/15/16 0459  HGB 7.4* 7.8* 7.6*  HCT 22.9* 24.3* 24.2*  WBC 11.0* 10.1 8.6  PLT 113* 174 180   No results found.  ASSESSMENT / PLAN:  MRSA bacteremia and endocarditis involving the tricuspid valve w/ most recent  ECHO suggesting progression of vegetation on 2/5 w/ associated sepsis ->most recent blood cultures on 2/10 negative to date.  ->Infectious disease is following. She is now on day 10 antibiotics and Dapto day 4 Plan Not surgical candidate  abx per ID  Multiple septic pulmonary emboli w/ associated cavitary PNA +SA (not MRSA) from sputum Plan Cont abx per ID service  Repeat CXR Will d/w ID   Dyspnea & tachypnea  and Chest pain 2/2 above Plan rx pain cxr now  ABG and lactic acid now  Metabolic acidosis (NAGMA) Plan Add bicarb gtt (may decrease WOB some) Serial chemistries    H/o pulmonary emboli (diagnosed Dec 2017). Apparently has been c/w NOAC per her report. More likely septic emboli Plan Dc ac  Anemia of critical illness w/ thrombocytopenia (d/t sepsis and improving) Plan Cont NOAC Trend CBC  Severe protein calorie malnutrition  Plan Per IM service   Poor dentition Plan Would consider dental eval. She likely needs several extractions  H/o IVDA Plan On-going support  Medical non-compliance  Plan Per IM service.   Will discuss further Imaging and [facility] plan w/ Dr Jamison Neighbor. She still looks acutely ill. Will get ABG, repeat CXR, lactic acid now. If  we could feel confident that her prior PE were indeed septic emboli then long term AC would not be needed.   Simonne MartinetPeter E Swayzie Choate ACNP-BC [facility] Pager # [phone] OR # [phone] if no answer    05/15/2016, 12:14 PM

## 2016-05-15 NOTE — Progress Notes (Signed)
ANTICOAGULATION CONSULT NOTE - Follow up Consult  Pharmacy Consult for apixaban Indication: hx pulmonary embolus  No Known Allergies  Patient Measurements: Height: 5\' 5"  (165.1 cm) Weight: 91 lb 11.4 oz (41.6 kg) IBW/kg (Calculated) : 57 Heparin Dosing Weight: 43 kg  Vital Signs: Temp: 98.5 F (36.9 C) (02/12 0519) Temp Source: Oral (02/12 0519) BP: 123/76 (02/12 0519) Pulse Rate: 125 (02/12 0519)  Labs:  Recent Labs  05/13/16 0514 05/14/16 1110 05/15/16 0459  HGB 7.4* 7.8* 7.6*  HCT 22.9* 24.3* 24.2*  PLT 113* 174 180  CREATININE 1.23*  --  1.05*    Estimated Creatinine Clearance: 52.4 mL/min (by C-G formula based on SCr of 1.05 mg/dL (H)).   Medications:  Home regimen: Eliquis 5 mg BID (reported last dose taken was 2 weeks ago) Infusions:    Assessment: Patient is a 31 y.o homeless F with hx substance abuse and bilateral PEs (03/22/16) on Eliquis PTA, presented to the ED on 05/07/2016 with c/o right shoulder and neck pain s/p fall 2 days prior to admission.  Hip and shoulder xrays and certical CT with no fractures noted. Patient has AKI on admission-- scr 2.91 (crcl~20).  Pharmacy is consulted to transition Eliquis to heparin drip.  Of note, platelet on admission was 81K (was in the ~200K a month ago).  No bleeding noted. MD suspects thrombocytopenia may be secondary to sepsis.  Significant events: 2/4PM  Per RN, heparin being stopped for 60 min q24 hrs while vanc infuses (incompatible, only 1 IV site available)  Today, 05/15/2016  Renal: AKI - resolved  CBC:  Hgb low but stable, Plts now WNL  No complications noted  No major drug interactions  No missed doses  Goal of Therapy:  Monitor renal function, CBC, and for bleeding   Plan:   Continue apixaban 5mg  PO BID for PE  No changes in therapy warranted, pharmacy will follow at distance.   Juliette Alcideustin Tavita Eastham, PharmD, BCPS.   Pager: [phone] 05/15/2016 7:52 AM

## 2016-05-15 NOTE — Progress Notes (Signed)
Zenia ResidesPeter Babcock, NP made aware that both phlebotomists have stuck patient and were unable to draw lactic acid result. Order to be cancelled per NP.

## 2016-05-15 NOTE — Progress Notes (Addendum)
Pt continues to have HR above 120 despite bolus. Metoprolol brought it down only for about an hour. On call coverage paged.  No other intervention ordered for Hr. Will continue to monitor.

## 2016-05-15 NOTE — Consult Note (Signed)
[facility] Consult  Physician Requesting Consult:  Calvert Cantor, M.D. / [facility]  Date of Consult:  05/15/2016  Reason for Consult/Chief Complaint:  Pulmonary Embolism  History of Presenting Illness:  31 y.o. female with a known history of intravenous drug abuse presenting with sepsis and Staphylococcus aureus bacteremia as well as tricuspid valve endocarditis and cavitary pneumonia with septic emboli. Per electronic medical record patient was initially diagnosed in August 2017 and is currently on her third hospitalization. She has been continuing to use IV drugs and previously was evaluated by cardiothoracic surgery. Patient was deemed a poor operative candidate given her malnutrition. Patient is also not a candidate for skilled nursing facility placement due to previously having left the facility AGAINST MEDICAL ADVICE. Patient is not a candidate for PICC placement due to her ongoing IV drug use. Patient initially was diagnosed with a pulmonary embolus back in August for left AGAINST MEDICAL ADVICE. Patient reports continued cough producing a purulent phlegm. She endorses chest pain that is pleuritic in nature and primarily with her cough as well as deep breathing. She endorses subjective fever and chills. She also has abdominal discomfort which seems to be primarily from her cough. She denies any nausea or vomiting at this time. Patient is being followed by infectious diseases who is further guiding antibiotic therapy. Patient has been on systemic anticoagulation for her pulmonary embolism as an outpatient.  Review of Systems:  Patient denies any headache or vision changes. She denies any rashes or abnormal bruising. A peritnent 14 point review of systems is negative except as per the history of presenting illness.  No Known Allergies  No current facility-administered medications on file prior to encounter.    Current Outpatient Prescriptions on File Prior to Encounter   Medication Sig Dispense Refill  . apixaban (ELIQUIS) 5 MG TABS tablet Take 1 tablet (5 mg total) by mouth every 12 (twelve) hours. Start on 03/31/16 60 tablet 5  . acetaminophen (TYLENOL) 325 MG tablet Take 2 tablets (650 mg total) by mouth every 6 (six) hours as needed for moderate pain. (Patient not taking: Reported on 06-04-2016) 30 tablet 0  . ferrous gluconate (FERGON) 324 MG tablet Take 1 tablet (324 mg total) by mouth 3 (three) times daily with meals. (Patient not taking: Reported on 04/10/2016) 90 tablet 3  . folic acid (FOLVITE) 1 MG tablet Take 1 tablet (1 mg total) by mouth daily. (Patient not taking: Reported on 04/10/2016) 30 tablet 0  . LORazepam (ATIVAN) 1 MG tablet Take 1 tablet (1 mg total) by mouth every 8 (eight) hours as needed for anxiety. (Patient not taking: Reported on 04/10/2016) 30 tablet 0  . Multiple Vitamin (MULTIVITAMIN WITH MINERALS) TABS tablet Take 1 tablet by mouth daily. (Patient not taking: Reported on 04/10/2016) 30 tablet 3  . sulfamethoxazole-trimethoprim (BACTRIM DS,SEPTRA DS) 800-160 MG tablet Take 1 tablet by mouth every 12 (twelve) hours. (Patient not taking: Reported on 04/10/2016) 60 tablet 0  . thiamine 100 MG tablet Take 1 tablet (100 mg total) by mouth daily. (Patient not taking: Reported on 04/10/2016) 30 tablet 0    Past Medical History:  Diagnosis Date  . Anemia   . CAP (community acquired pneumonia) 11/16/2015  . Childhood asthma   . Endocarditis   . Enterococcal bacteremia   . Heroin abuse   . Hyponatremia   . Pneumonia 2007  . Polysubstance abuse     Past Surgical History:  Procedure Laterality Date  . NO PAST SURGERIES    .  TEE WITHOUT CARDIOVERSION N/A 11/23/2015   Procedure: TRANSESOPHAGEAL ECHOCARDIOGRAM (TEE) WITH ANESTHESIA;  Surgeon: Quintella Reichert, MD;  Location: [facility] ENDOSCOPY;  Service: Cardiovascular;  Laterality: N/A;    Family History  Problem Relation Age of Onset  . Anesthesia problems Neg Hx   . Hypotension Neg Hx   . Malignant  hyperthermia Neg Hx   . Pseudochol deficiency Neg Hx     Social History   Social History  . Marital status: Single    Spouse name: N/A  . Number of children: N/A  . Years of education: N/A   Occupational History  . unemployed, former Film/video editor    Social History Main Topics  . Smoking status: Current Every Day Smoker    Packs/day: 0.50    Years: 10.00    Types: Cigarettes  . Smokeless tobacco: Never Used  . Alcohol use No  . Drug use: Yes    Types: IV, Cocaine, Heroin, Oxycodone     Comment: 11/17/2015 "been only doing pills lately to try to get offf H"  . Sexual activity: Yes    Birth control/ protection: Condom   Other Topics Concern  . None   Social History Narrative  . None   Temp:  [98.5 F (36.9 C)-99.4 F (37.4 C)] 99.1 F (37.3 C) (02/12 1345) Pulse Rate:  [122-129] 129 (02/12 1345) Resp:  [18-20] 20 (02/12 1345) BP: (117-134)/(76-91) 134/76 (02/12 1345) SpO2:  [93 %-97 %] 93 % (02/12 1345)  General:  Awake. Alert. No distress but obviously uncomfortable with her coughing spells. Watching TV. Thin, emaciated female. Integument:  Warm & dry. No rash on exposed skin.  Extremities:  No cyanosis or clubbing.  Lymphatics:  No appreciated cervical or supraclavicular lymphadenoapthy. HEENT:  Tacky mucus membranes. No scleral injection or icterus.  Cardiovascular:  Regular rhythm with tachycardia. No edema. No JVD appreciated.  Pulmonary:  Coarse breath sounds to auscultation bilaterally. Symmetric chest wall expansion. No accessory muscle use. Intermittent coughing witnessed. Abdomen: Soft. Normal bowel sounds. Nondistended. Nontender. Musculoskeletal:  Normal bulk and tone. Hand grip strength 5/5 bilaterally. No joint deformity or effusion appreciated. Neurological:  Cranial nerves 2-12 grossly in tact. No meningismus. Moving all 4 extremities equally. Symmetric brachioradialis deep tendon reflexes. Psychiatric:  Mood and affect congruent. Speech normal  rhythm, rate & tone.   CBC Latest Ref Rng & Units 05/15/2016 05/14/2016 05/13/2016  WBC 4.0 - 10.5 K/uL 8.6 10.1 11.0(H)  Hemoglobin 12.0 - 15.0 g/dL 7.6(L) 7.8(L) 7.4(L)  Hematocrit 36.0 - 46.0 % 24.2(L) 24.3(L) 22.9(L)  Platelets 150 - 400 K/uL 180 174 113(L)    BMP Latest Ref Rng & Units 05/15/2016 05/13/2016 05/12/2016  Glucose 65 - 99 mg/dL 161(W) 960(A) 540(J)  BUN 6 - 20 mg/dL 16 81(X) 91(Y)  Creatinine 0.44 - 1.00 mg/dL 7.82(N) 5.62(Z) 3.08(M)  Sodium 135 - 145 mmol/L 134(L) 134(L) 133(L)  Potassium 3.5 - 5.1 mmol/L 4.3 4.3 4.4  Chloride 101 - 111 mmol/L 111 110 108  CO2 22 - 32 mmol/L 16(L) 17(L) 18(L)  Calcium 8.9 - 10.3 mg/dL 8.0(L) 7.6(L) 7.5(L)    IMAGING/STUDIES: CTA CHEST 03/22/16:  Per radiologist. Bilateral lower lobe pulmonary emboli with taper. Since which can be seen with chronic pulmonary emboli or possible vasculopathy secondary to septic emboli. Multiple cavitating and non-cavitating pneumonia/consolidation of varying ages consistent with septic emboli. Trace pleural effusions. Mild cardiomegaly with dilated right atrium. Suspect splenomegaly.  VENOUS DUPLEX BILATERAL LOWER EXTREMITIES 03/23/16: No evidence of DVT or SVT involving right or  left lower extremity.  CT CHEST W/O 05/27/2016:  Personally reviewed by me. No pleural effusion or thickening. No pericardial effusion. Multiple cavitary nodules and nodular opacities throughout all lung zones. Precarinal lymphadenopathy with significant enlargement up to 1.5 cm in short axis by my review.  TTE 05/08/16:  LV normal in size with EF 60-65% & no regional wall motion abnormalities. LA normal in size. RA severely dilated. RV moderately dilated with preserved systolic function. No aortic stenosis or regurgitation. Aortic root normal in size. No mitral stenosis or regurgitation. No pulmonic stenosis or regurgitation. Severe tricuspid regurgitation. No pericardial effusion.  MICROBIOLOGY: HIV 2/3:  Nonreactive Blood Cultures x2  2/3:  1/2 Bottles MRSA Urine Culture 2/3:  <10,000 Colonies insignificant growth Sputum Culture 2/4:  Moderate MSSA Sputum AFB Smear/Culture 2/4 >>>  Smear Negative / Culture pending Blood Culture x4 2/5:  4/4 Positive MRSA Sputum AFB Smear/Culture 2/7 >>> Smear Negative / Culture pending Blood Culture x1 2/7:  Staphylococcus aureus Influenza A/B PCR 2/8:  Negative   ANTIBIOTICS: Cefepime 2/3 (x1 dose) Vancomycin 2/3 - 2/8 Daptomycin 2/8 - 2/12 Zyvox 2/12 >>>  ASSESSMENT/PLAN:  31 y.o. female with MRSA bacteremia and Staphylococcus aureus pneumonia. Patient's cavitary lung nodules by my review are consistent with septic emboli. Patient's previous CT angiogram of the chest with lower lobe predominant emboli would also be more consistent with septic emboli especially in light of the radiology description and history of tricuspid valve vegetations noted initially on echocardiogram in August 2017 which were characterized as large and 2 in number. Patient's degree of chest discomfort is likely secondary to her multifocal pneumonia and some element of pleural inflammation despite this is not being obvious on CT of the chest performed recently. Given the fact that the patient's previous emboli were likely septic in etiology and there is no history of documented venous clot otherwise I do not feel that systemic anticoagulation is necessary or would be recommended in this individual with a poor history of compliance.  1. Pulmonary emboli: Noted on CT angiogram in December 2017. Likely septic in etiology. Recommend discontinuing systemic anticoagulation at this time. 2. MRSA bacteremia: ID following. Switching antibiotic coverage to linezolid from daptomycin in the setting of her Staphylococcus aureus pneumonia. 3. Staphylococcus aureus multifocal and cavitary pneumonia: Initiating treatment with Zyvox per ID recommendations today. 4. Pain: Management per palliative medicine service. Starting Toradol IV  q6hr to help with pleuritic component while closely monitoring renal function.  Remainder of care as per primary service and consultants.  Donna ChristenJennings E. Jamison NeighborNestor, M.D. [facility] Pager:  [phone] After 3pm or if no response, call [phone] 4:05 PM 05/15/16

## 2016-05-15 NOTE — Progress Notes (Addendum)
Patient ID: Catherine Hernandez, female   DOB: June 15, 1985, 31 y.o.   MRN: [id number]          [facility]  Date of Admission:  05/23/16    Total days of antibiotics 10        Day 4 daptomycin          Principal Problem:   Endocarditis of tricuspid valve Active Problems:   HCAP (healthcare-associated pneumonia)   MRSA bacteremia   Enterococcal bacteremia   Staphylococcus aureus bacteremia   Serratia infection   Polysubstance abuse   IVDU (intravenous drug user)   Opioid use disorder, severe, dependence (HCC)   Hyponatremia   Thrombocytopenia (HCC)   Cavitary lung disease   Poor dentition   Acute kidney injury (HCC)   Protein-calorie malnutrition, severe (HCC)   Symptomatic anemia   Septic embolism (HCC)   Tobacco abuse   History of pulmonary embolism   . sodium chloride   Intravenous Once  . apixaban  5 mg Oral BID  . benzonatate  200 mg Oral TID  . chlorhexidine  15 mL Mouth Rinse BID  . chlorpheniramine-HYDROcodone  5 mL Oral Q12H  . DAPTOmycin (CUBICIN)  IV  350 mg Intravenous Q24H  . dextromethorphan  30 mg Oral BID  . feeding supplement  1 Container Oral BID BM  . feeding supplement (ENSURE ENLIVE)  237 mL Oral TID BM  . mouth rinse  15 mL Mouth Rinse q12n4p  . multivitamin with minerals  1 tablet Oral Daily  . oxyCODONE  20 mg Oral Q12H  . sodium chloride flush  3 mL Intravenous Q12H  . vitamin B-12  1,000 mcg Oral Daily    SUBJECTIVE: She states She is feeling better after she increased her pain medication. She tells me that she is planning on going to stay with her boyfriend, Catherine Hernandez, and [place], [place] after she is discharged. She tells me that she is willing to stay here for continued IV antibiotic therapy.  Review of Systems: Review of Systems  Constitutional: Positive for malaise/fatigue. Negative for chills, diaphoresis, fever and weight loss.  HENT: Negative for sore throat.   Respiratory: Positive for cough,  hemoptysis, sputum production and shortness of breath.   Cardiovascular: Positive for chest pain.  Gastrointestinal: Negative for abdominal pain, constipation, diarrhea, nausea and vomiting.  Genitourinary: Negative for dysuria.  Musculoskeletal: Positive for back pain.  Skin: Negative for itching and rash.  Neurological: Negative for weakness.  Psychiatric/Behavioral: Positive for depression and substance abuse. The patient is nervous/anxious.     Past Medical History:  Diagnosis Date  . Anemia   . CAP (community acquired pneumonia) 11/16/2015  . Childhood asthma   . Endocarditis   . Enterococcal bacteremia   . Heroin abuse   . Hyponatremia   . Pneumonia 2007  . Polysubstance abuse     Social History  Substance Use Topics  . Smoking status: Current Every Day Smoker    Packs/day: 0.50    Years: 10.00    Types: Cigarettes  . Smokeless tobacco: Never Used  . Alcohol use No    Family History  Problem Relation Age of Onset  . Anesthesia problems Neg Hx   . Hypotension Neg Hx   . Malignant hyperthermia Neg Hx   . Pseudochol deficiency Neg Hx    No Known Allergies  OBJECTIVE: Vitals:   05/14/16 1952 05/14/16 2157 05/15/16 0059 05/15/16 0519  BP:  117/80 123/80 123/76  Pulse:  Marland Kitchen)  122  (!) 125  Resp:  18 20 20   Temp:  99.3 F (37.4 C) 99.4 F (37.4 C) 98.5 F (36.9 C)  TempSrc:  Oral Oral Oral  SpO2: 94% 96% 97% 96%  Weight:      Height:       Body mass index is 15.26 kg/m.  Physical Exam  Constitutional:  She is cachectic. She is very drowsy and resting quietly in bed. She answers questions slowly.  Cardiovascular: Regular rhythm.   No murmur heard. She remains tachycardic.  Pulmonary/Chest: Effort normal. She has no wheezes. She has rales.  She has frequent dry cough.    Lab Results Lab Results  Component Value Date   WBC 8.6 05/15/2016   HGB 7.6 (L) 05/15/2016   HCT 24.2 (L) 05/15/2016   MCV 85.5 05/15/2016   PLT 180 05/15/2016    Lab Results   Component Value Date   CREATININE 1.05 (H) 05/15/2016   BUN 16 05/15/2016   NA 134 (L) 05/15/2016   K 4.3 05/15/2016   CL 111 05/15/2016   CO2 16 (L) 05/15/2016    Lab Results  Component Value Date   ALT 8 (L) 05/08/2016   AST 14 (L) 05/08/2016   ALKPHOS 95 05/08/2016   BILITOT 0.7 05/08/2016     Microbiology: Recent Results (from the past 240 hour(s))  Urine culture     Status: Abnormal   Collection Time: May 07, 2016  3:27 PM  Result Value Ref Range Status   Specimen Description URINE, RANDOM  Final   Special Requests NONE  Final   Culture (A)  Final    <10,000 COLONIES/mL INSIGNIFICANT GROWTH Performed at [facility], [address] [place], [place] [address]    Report Status 05/08/2016 FINAL  Final  Blood culture (routine x 2)     Status: Abnormal   Collection Time: 05/07/16  5:35 PM  Result Value Ref Range Status   Specimen Description BLOOD LEFT ARM  Final   Special Requests IN PEDIATRIC BOTTLE 2CC  Final   Culture  Setup Time   Final    GRAM POSITIVE COCCI IN CLUSTERS IN PEDIATRIC BOTTLE CRITICAL RESULT CALLED TO, READ BACK BY AND VERIFIED WITH: T. GREEN PHARMD, AT [id number] 05/07/16 BY Renato Shin Performed at [facility], [address] [place], [place] [address]    Culture METHICILLIN RESISTANT STAPHYLOCOCCUS AUREUS (A)  Final   Report Status 05/09/2016 FINAL  Final   Organism ID, Bacteria METHICILLIN RESISTANT STAPHYLOCOCCUS AUREUS  Final      Susceptibility   Methicillin resistant staphylococcus aureus - MIC*    CIPROFLOXACIN <=0.5 SENSITIVE Sensitive     ERYTHROMYCIN >=8 RESISTANT Resistant     GENTAMICIN <=0.5 SENSITIVE Sensitive     OXACILLIN >=4 RESISTANT Resistant     TETRACYCLINE >=16 RESISTANT Resistant     VANCOMYCIN 1 SENSITIVE Sensitive     TRIMETH/SULFA <=10 SENSITIVE Sensitive     CLINDAMYCIN >=8 RESISTANT Resistant     RIFAMPIN <=0.5 SENSITIVE Sensitive     Inducible Clindamycin NEGATIVE Sensitive     * METHICILLIN RESISTANT  STAPHYLOCOCCUS AUREUS  Blood Culture ID Panel (Reflexed)     Status: Abnormal   Collection Time: 2016-05-07  5:35 PM  Result Value Ref Range Status   Enterococcus species NOT DETECTED NOT DETECTED Final   Listeria monocytogenes NOT DETECTED NOT DETECTED Final   Staphylococcus species DETECTED (A) NOT DETECTED Final    Comment: CRITICAL RESULT CALLED TO, READ BACK BY AND  VERIFIED WITH: T. GREEN PHARMD, AT 0919 05/07/16 BY D. VANHOOK    Staphylococcus aureus DETECTED (A) NOT DETECTED Final    Comment: Methicillin (oxacillin)-resistant Staphylococcus aureus (MRSA). MRSA is predictably resistant to beta-lactam antibiotics (except ceftaroline). Preferred therapy is vancomycin unless clinically contraindicated. Patient requires contact precautions if  hospitalized. CRITICAL RESULT CALLED TO, READ BACK BY AND VERIFIED WITH: T. GREEN PHARMD, AT [id number] 05/07/16 BY D. VANHOOK    Methicillin resistance DETECTED (A) NOT DETECTED Final    Comment: CRITICAL RESULT CALLED TO, READ BACK BY AND VERIFIED WITH: T. GREEN PHARMD, AT [phone] 05/07/16 BY D. VANHOOK    Streptococcus species NOT DETECTED NOT DETECTED Final   Streptococcus agalactiae NOT DETECTED NOT DETECTED Final   Streptococcus pneumoniae NOT DETECTED NOT DETECTED Final   Streptococcus pyogenes NOT DETECTED NOT DETECTED Final   Acinetobacter baumannii NOT DETECTED NOT DETECTED Final   Enterobacteriaceae species NOT DETECTED NOT DETECTED Final   Enterobacter cloacae complex NOT DETECTED NOT DETECTED Final   Escherichia coli NOT DETECTED NOT DETECTED Final   Klebsiella oxytoca NOT DETECTED NOT DETECTED Final   Klebsiella pneumoniae NOT DETECTED NOT DETECTED Final   Proteus species NOT DETECTED NOT DETECTED Final   Serratia marcescens NOT DETECTED NOT DETECTED Final   Haemophilus influenzae NOT DETECTED NOT DETECTED Final   Neisseria meningitidis NOT DETECTED NOT DETECTED Final   Pseudomonas aeruginosa NOT DETECTED NOT DETECTED Final   Candida albicans  NOT DETECTED NOT DETECTED Final   Candida glabrata NOT DETECTED NOT DETECTED Final   Candida krusei NOT DETECTED NOT DETECTED Final   Candida parapsilosis NOT DETECTED NOT DETECTED Final   Candida tropicalis NOT DETECTED NOT DETECTED Final    Comment: Performed at [facility], [address] [place], [place] [address]  Blood culture (routine x 2)     Status: None   Collection Time: 05/24/2016  6:18 PM  Result Value Ref Range Status   Specimen Description BLOOD RIGHT NECK  Final   Special Requests IN PEDIATRIC BOTTLE 2CC  Final   Culture   Final    NO GROWTH 5 DAYS Performed at [facility], [address] [place], [place] [address]    Report Status 05/11/2016 FINAL  Final  MRSA PCR Screening     Status: None   Collection Time: 05/07/16 12:57 AM  Result Value Ref Range Status   MRSA by PCR NEGATIVE NEGATIVE Final    Comment:        The GeneXpert MRSA Assay (FDA approved for NASAL specimens only), is one component of a comprehensive MRSA colonization surveillance program. It is not intended to diagnose MRSA infection nor to guide or monitor treatment for MRSA infections.   Acid Fast Smear (AFB)     Status: None   Collection Time: 05/07/16  1:20 PM  Result Value Ref Range Status   AFB Specimen Processing Concentration  Final   Acid Fast Smear Negative  Final    Comment: (NOTE) Performed At: [facility] [address] [place], [place] [id number] Mila HomerHancock William F MD [id number]:[phone]    Source (AFB) SPUTUM  Final  Culture, sputum-assessment     Status: None   Collection Time: 05/07/16  1:22 PM  Result Value Ref Range Status   Specimen Description SPUTUM  Final   Special Requests Immunocompromised  Final   Sputum evaluation THIS SPECIMEN IS ACCEPTABLE FOR SPUTUM CULTURE  Final   Report Status 05/07/2016 FINAL  Final  Culture, respiratory (NON-Expectorated)  Status: None   Collection Time: 05/07/16  1:22 PM  Result Value Ref Range Status   Specimen  Description SPUTUM  Final   Special Requests Immunocompromised Reflexed from [id number]  Final   Gram Stain   Final    ABUNDANT WBC PRESENT, PREDOMINANTLY PMN MODERATE YEAST MODERATE GRAM POSITIVE RODS Performed at [facility], [address], [place], [place] [address]    Culture MODERATE STAPHYLOCOCCUS AUREUS  Final   Report Status 05/10/2016 FINAL  Final   Organism ID, Bacteria STAPHYLOCOCCUS AUREUS  Final      Susceptibility   Staphylococcus aureus - MIC*    CIPROFLOXACIN <=0.5 SENSITIVE Sensitive     ERYTHROMYCIN >=8 RESISTANT Resistant     GENTAMICIN <=0.5 SENSITIVE Sensitive     OXACILLIN 0.5 SENSITIVE Sensitive     TETRACYCLINE >=16 RESISTANT Resistant     VANCOMYCIN 1 SENSITIVE Sensitive     TRIMETH/SULFA <=10 SENSITIVE Sensitive     CLINDAMYCIN >=8 RESISTANT Resistant     RIFAMPIN <=0.5 SENSITIVE Sensitive     Inducible Clindamycin NEGATIVE Sensitive     * MODERATE STAPHYLOCOCCUS AUREUS  Culture, blood (Routine X 2) w Reflex to ID Panel     Status: Abnormal   Collection Time: 05/08/16  6:25 AM  Result Value Ref Range Status   Specimen Description BLOOD LEFT ARM  Final   Special Requests IN PEDIATRIC BOTTLE 2CC  Final   Culture  Setup Time   Final    GRAM POSITIVE COCCI IN CLUSTERS AEROBIC BOTTLE ONLY CRITICAL VALUE NOTED.  VALUE IS CONSISTENT WITH PREVIOUSLY REPORTED AND CALLED VALUE.    Culture (A)  Final    STAPHYLOCOCCUS AUREUS SUSCEPTIBILITIES PERFORMED ON PREVIOUS CULTURE WITHIN THE LAST 5 DAYS. Performed at [facility], [address], [place], [place] [address]    Report Status 05/11/2016 FINAL  Final  Culture, blood (routine x 2)     Status: Abnormal   Collection Time: 05/08/16  5:59 PM  Result Value Ref Range Status   Specimen Description BLOOD LEFT HAND  Final   Special Requests IN PEDIATRIC BOTTLE 1 CC  Final   Culture  Setup Time   Final    GRAM POSITIVE COCCI IN CLUSTERS IN PEDIATRIC BOTTLE CRITICAL RESULT CALLED TO, READ BACK BY AND  VERIFIED WITH: PHARMD J GADHIA [id number] MLM    Culture (A)  Final    STAPHYLOCOCCUS AUREUS SUSCEPTIBILITIES PERFORMED ON PREVIOUS CULTURE WITHIN THE LAST 5 DAYS. Performed at [facility], [address], [place], [place] [address]    Report Status 05/11/2016 FINAL  Final  Culture, blood (routine x 2)     Status: Abnormal   Collection Time: 05/08/16  5:59 PM  Result Value Ref Range Status   Specimen Description BLOOD LEFT ARM  Final   Special Requests IN PEDIATRIC BOTTLE 1.5CC  Final   Culture  Setup Time   Final    GRAM POSITIVE COCCI IN CLUSTERS AEROBIC BOTTLE ONLY CRITICAL VALUE NOTED.  VALUE IS CONSISTENT WITH PREVIOUSLY REPORTED AND CALLED VALUE.    Culture (A)  Final    STAPHYLOCOCCUS AUREUS SUSCEPTIBILITIES PERFORMED ON PREVIOUS CULTURE WITHIN THE LAST 5 DAYS. Performed at [facility], [address], [place], [place] [address]    Report Status 05/11/2016 FINAL  Final  Culture, blood (Routine X 2) w Reflex to ID Panel     Status: Abnormal   Collection Time: 05/08/16  9:49 PM  Result Value Ref Range Status  Specimen Description BLOOD LEFT HAND  Final   Special Requests IN PEDIATRIC BOTTLE 0.5CC  Final   Culture  Setup Time   Final    GRAM POSITIVE COCCI IN CLUSTERS IN PEDIATRIC BOTTLE CRITICAL RESULT CALLED TO, READ BACK BY AND VERIFIED WITH: BETH GREEN,PHARMD @2339  002/05/18 MKELLY,MLT Performed at [facility], [address], [place], [place] [address]    Culture METHICILLIN RESISTANT STAPHYLOCOCCUS AUREUS (A)  Final   Report Status 05/11/2016 FINAL  Final   Organism ID, Bacteria METHICILLIN RESISTANT STAPHYLOCOCCUS AUREUS  Final      Susceptibility   Methicillin resistant staphylococcus aureus - MIC*    CIPROFLOXACIN <=0.5 SENSITIVE Sensitive     ERYTHROMYCIN >=8 RESISTANT Resistant     GENTAMICIN <=0.5 SENSITIVE Sensitive     OXACILLIN >=4 RESISTANT Resistant     TETRACYCLINE >=16 RESISTANT Resistant     VANCOMYCIN <=0.5 SENSITIVE  Sensitive     TRIMETH/SULFA <=10 SENSITIVE Sensitive     CLINDAMYCIN >=8 RESISTANT Resistant     RIFAMPIN <=0.5 SENSITIVE Sensitive     Inducible Clindamycin NEGATIVE Sensitive     * METHICILLIN RESISTANT STAPHYLOCOCCUS AUREUS  Acid Fast Smear (AFB)     Status: None   Collection Time: 05/10/16  6:09 AM  Result Value Ref Range Status   AFB Specimen Processing Concentration  Final   Acid Fast Smear Negative  Final    Comment: (NOTE) Performed At: [facility] [address] [place], [place] [id number] Mila Homer MD WN:[id number]    Source (AFB) SPUTUM  Final  Culture, blood (routine x 2)     Status: Abnormal   Collection Time: 05/10/16 11:18 AM  Result Value Ref Range Status   Specimen Description BLOOD RIGHT ARM  Final   Special Requests IN PEDIATRIC BOTTLE 3CC  Final   Culture  Setup Time   Final    GRAM POSITIVE COCCI IN CLUSTERS IN PEDIATRIC BOTTLE CRITICAL VALUE NOTED.  VALUE IS CONSISTENT WITH PREVIOUSLY REPORTED AND CALLED VALUE.    Culture (A)  Final    STAPHYLOCOCCUS AUREUS SUSCEPTIBILITIES PERFORMED ON PREVIOUS CULTURE WITHIN THE LAST 5 DAYS. Performed at [facility], [address], [place], [place] [address]    Report Status 05/12/2016 FINAL  Final  Culture, blood (routine x 2)     Status: None (Preliminary result)   Collection Time: 05/13/16 10:46 AM  Result Value Ref Range Status   Specimen Description BLOOD LEFT ARM  Final   Special Requests IN PEDIATRIC BOTTLE 1CC  Final   Culture   Final    NO GROWTH 1 DAY Performed at [facility], [address] [place], [place] [address]    Report Status PENDING  Incomplete  Culture, blood (routine x 2)     Status: None (Preliminary result)   Collection Time: 05/13/16 10:49 AM  Result Value Ref Range Status   Specimen Description BLOOD RIGHT HAND  Final   Special Requests IN PEDIATRIC BOTTLE 3CC  Final   Culture   Final    NO GROWTH 1 DAY Performed at [facility], [address], [place], [place] [address]    Report Status PENDING  Incomplete   Chest x-ray 05/15/2016  IMPRESSION: 1. Worsening patchy areas of airspace disease involving bilateral upper and lower lobes in a peripheral distribution with cavitation in the right upper lobe and right lower lobe opacities consistent with septic emboli.   Electronically Signed   By: Elige Ko  On: 05/15/2016 12:40  ASSESSMENT: She is afebrile and her most recent blood cultures on 05/13/2016 are now negative. She seems to be making some slow progress. Social work has declared that she is not eligible for transfer to a skilled nursing facility since she left AMA last time she was transferred. She is willing to stay here for now. I certainly think it is in her best interest to remain hospitalized on IV daptomycin. If IV access becomes a problem I would have a PICC placed.  Addendum: I was called by Anders Simmonds, NP. We reviewed today's x-ray. She has worsening cavitary infiltrates bilaterally. I agree with changing daptomycin to linezolid to improve lung penetration.  PLAN: 1. Change daptomycin to linezolid 2. I will follow-up on Wednesday, 05/17/2016  Cliffton Asters, MD [facility] [phone] pager   [phone] cell 05/15/2016, 10:15 AM

## 2016-05-16 DIAGNOSIS — A498 Other bacterial infections of unspecified site: Secondary | ICD-10-CM

## 2016-05-16 DIAGNOSIS — I2690 Septic pulmonary embolism without acute cor pulmonale: Secondary | ICD-10-CM

## 2016-05-16 DIAGNOSIS — Z720 Tobacco use: Secondary | ICD-10-CM

## 2016-05-16 DIAGNOSIS — R0789 Other chest pain: Secondary | ICD-10-CM

## 2016-05-16 DIAGNOSIS — D649 Anemia, unspecified: Secondary | ICD-10-CM

## 2016-05-16 DIAGNOSIS — K089 Disorder of teeth and supporting structures, unspecified: Secondary | ICD-10-CM

## 2016-05-16 LAB — BASIC METABOLIC PANEL
Anion gap: 7 (ref 5–15)
BUN: 24 mg/dL — ABNORMAL HIGH (ref 6–20)
CO2: 18 mmol/L — ABNORMAL LOW (ref 22–32)
CREATININE: 1.22 mg/dL — AB (ref 0.44–1.00)
Calcium: 8 mg/dL — ABNORMAL LOW (ref 8.9–10.3)
Chloride: 107 mmol/L (ref 101–111)
GFR, EST NON AFRICAN AMERICAN: 60 mL/min — AB (ref >60)
Glucose, Bld: 102 mg/dL — ABNORMAL HIGH (ref 65–99)
Potassium: 4.5 mmol/L (ref 3.5–5.1)
SODIUM: 132 mmol/L — AB (ref 135–145)

## 2016-05-16 LAB — TYPE AND SCREEN
BLOOD PRODUCT EXPIRATION DATE: 201803062359
BLOOD PRODUCT EXPIRATION DATE: 201803082359
ISSUE DATE / TIME: 201802101540
ISSUE DATE / TIME: 201802121317
UNIT TYPE AND RH: 5100
UNIT TYPE AND RH: 5100

## 2016-05-16 LAB — CBC
HCT: 25.3 % — ABNORMAL LOW (ref 36.0–46.0)
Hemoglobin: 8.2 g/dL — ABNORMAL LOW (ref 12.0–15.0)
MCH: 27.3 pg (ref 26.0–34.0)
MCHC: 32.4 g/dL (ref 30.0–36.0)
MCV: 84.3 fL (ref 78.0–100.0)
Platelets: 189 10*3/uL (ref 150–400)
RBC: 3 MIL/uL — ABNORMAL LOW (ref 3.87–5.11)
RDW: 17.9 % — ABNORMAL HIGH (ref 11.5–15.5)
WBC: 8.8 10*3/uL (ref 4.0–10.5)

## 2016-05-16 LAB — GLUCOSE, CAPILLARY: GLUCOSE-CAPILLARY: 86 mg/dL (ref 65–99)

## 2016-05-16 MED ORDER — OXYCODONE HCL ER 15 MG PO T12A
30.0000 mg | EXTENDED_RELEASE_TABLET | Freq: Two times a day (BID) | ORAL | Status: DC
  Administered 2016-05-16 (×2): 30 mg via ORAL
  Filled 2016-05-16 (×2): qty 1

## 2016-05-16 MED ORDER — MEGESTROL ACETATE 400 MG/10ML PO SUSP
400.0000 mg | Freq: Every day | ORAL | Status: DC
  Administered 2016-05-16: 400 mg via ORAL
  Filled 2016-05-16 (×2): qty 10

## 2016-05-16 MED ORDER — ENOXAPARIN SODIUM 30 MG/0.3ML ~~LOC~~ SOLN: 30.0000 mg | SUBCUTANEOUS | Status: DC

## 2016-05-16 MED ORDER — ENOXAPARIN SODIUM 40 MG/0.4ML ~~LOC~~ SOLN
40.0000 mg | SUBCUTANEOUS | Status: DC
Start: 2016-05-16 — End: 2016-05-16
  Filled 2016-05-16: qty 0.4

## 2016-05-16 MED ORDER — OXYCODONE HCL ER 20 MG PO T12A: 40.0000 mg | EXTENDED_RELEASE_TABLET | Freq: Two times a day (BID) | ORAL | Status: DC

## 2016-05-16 NOTE — Progress Notes (Signed)
Nutrition Follow-up  DOCUMENTATION CODES:   Severe malnutrition in context of chronic illness, Underweight  INTERVENTION:  - Continue Boost Breeze BID and Ensure Enlive TID. - Continue to encourage PO intakes of meals and supplements.  - RD will continue to monitor for additional needs.   NUTRITION DIAGNOSIS:   Malnutrition related to chronic illness as evidenced by severe depletion of body fat, severe depletion of muscle mass. -ongoing  GOAL:   Patient will meet greater than or equal to 90% of their needs -met  MONITOR:   PO intake, Supplement acceptance, Labs, Weight trends, I & O's  ASSESSMENT:   31 y.o.femalewith medical history significant for IV drug abuse, endocarditis involving tricuspid valve, and nonadherence with her treatment plan, now presenting to the emergency department with generalized aches and pains, malaise, productive cough, and fatigue.   2/13 Per review, pt consumed 100% of breakfast, 10% of lunch, and 75% of dinner yesterday; today she consumed 90% of breakfast. These meals provided a total of ~2326 kcal and ~74 grams of protein. Pt has been consuming oral nutrition supplements about 50% of the time that they're offered. Weight -1.3 kg from 2/10-2/11 and no weight recorded from yesterday or today. Per Dr. Reggy Eye note this AM, plan for Megace to be added to assist with appetite. Will continue to monitor intakes and if changes occur or if pt is able to continue meeting estimated needs without appetite stimulant.  Medications reviewed; PRN Dulcolax, daily multivitamin with minerals. Labs reviewed; Na: 132 mmol/L, BUN: 24 mg/dL, creatinine: 1.22 mg/dL, Ca: 8 mg/dL.     2/9 - Per chart review, pt has been accepting Ensure supplements ~50% of the time.  - She consumed 75% of breakfast and 100% of lunch and dinner yesterday.  - She also ate 100% of breakfast this AM which consisted of : scrambled eggs, angel food cake, Frosted flakes, breakfast potatoes, and  a Coke.  - Pt likes sweet items; will add Boost Breeze to provide more protein between meals.  - Weight has been stable since admission.  - Dr. Gaynelle Adu note from this AM states pt with poor prognosis.    2/5 - Patient currently refusing meals and supplements.  - Pt with history of IVDU (cocaine, opiates, benzos) and severe malnutrition.  - Pt with history of homelessness.  - Pt's weight continues to stay around 95 lb.  - Pt has been ordered Ensure supplements. - NFPE consistent with previous admissions, severe muscle and fat depletion.   Diet Order:  Diet regular Room service appropriate? Yes; Fluid consistency: Thin  Skin:  Reviewed, no issues  Last BM:  2/12  Height:   Ht Readings from Last 1 Encounters:  05/21/2016 '5\' 5"'$  (1.651 m)    Weight:   Wt Readings from Last 1 Encounters:  05/14/16 91 lb 11.4 oz (41.6 kg)    Ideal Body Weight:  56.8 kg  BMI:  Body mass index is 15.26 kg/m.  Estimated Nutritional Needs:   Kcal:  1500-1700  Protein:  80-90g  Fluid:  1.5-1.7L/day  EDUCATION NEEDS:   No education needs identified at this time    Jarome Matin, MS, RD, LDN, CNSC Inpatient Clinical Dietitian Pager # [phone] After hours/weekend pager # [phone]

## 2016-05-16 NOTE — Progress Notes (Addendum)
PROGRESS NOTE    Catherine Hernandez   AOZ:[id number]  DOB: [birth year]  DOA: 2016-06-03 PCP: No PCP Per Patient   Brief Narrative:  31 y.o.femalewith medical history significant for drug abuse with heroin, cocaine, tobacco, asthma endocarditis involving tricuspid valve and nonadherence with her treatment plan now presenting to the emergency department with generalized aches and pains, malaise, productive cough and fatigue.   She has had a complicated recent medical course involving hospitalization in December 2017 with Serratia bacteremia, bilateral pulmonary emboli and tricuspid valve endocarditis. She was evaluated by CT surgery at that time and deemed to not be a surgical candidate.She was treated with antibiotics in the hospital and discharged to complete a course of Cipro.   She returned to the hospital with the starting of January 2018 with sepsis secondary to HCAP and noted to have cavitary pulmonary lesions. She was treated with empiric vancomycin and cefepime in the hospital. Per ID, she was not a PICC candidate due to IV drug abuse and was discharged with 1 month of Bactrim which she never filled.   She returns to the hospital and found to be afebrile chest x-ray showed patchy bilateral air space opacities likely representing multifocal pneumonia #2 septic emboli or asymmetric edema. CT of the cervical spine notable for opacities and nodules in the lung apices with cavitation Blood Cx with MRSA.  Subjective: Coughing improving- severe chest pain better controlled on medications but not resolved completely- appetite waxes and wanes.   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 Multifocal pneumonia due to septic embolization Sepsis/MRSA bacteremia / MSSA pneumonia - previously treated for Serratia Bactermia x 2 admissions and discharged with Bactrim prescription which she did not fill but continue to use IV drugs-Patient was not a PICC line  candidate at recent discharge due to ongoing IV drug abuse and states she didn't fill script for bactrim due to cost issues - blood cultures 2/3, 2/5, 2/7 growing MRSA  - sputum culture growing MSSA - cultures from 2/10 are negative -Palliative care team re-consulted- very poor prognosis -seen by CVTS  for Tricuspid vegetation- not a surgical candidate due to overall malnourished condition and dental caries- -  - -Echo on 05/08/16- Vegetation increasing in size with severe regurgitation   - will need aggressive care if she is going to survive this- have spoken with social work about NH placement for her- patient is agreeable however as she left AMA from SNF last time, she is no longer a candidate to go to SNF - will need to keep in hospital for IV antibiotics for the duration of the treatment - PCCM and ID following - Daptomycin changed to Zyvox 2/12 as lung infiltrates were increasing - severe chest pain- continuing to increase Narcotics for pain control- Oxycontin increased to 30 BID today- have advised her to stop IV Morphine- narcotics to only be used during hospital stay and weaned off as she improves - severe anxiety due to pain-  cont Ativan for now  Acute kidney injury - patient baseline creatinine 0.6 in January 2018, admitted with creatinine of 2.91. -  Toradol x 3 doses given yesterday by PCCM  -creatinine was improving steadily but has worsened to 1.22 today- have stopped Toradol for now - no hydronephrosis noted on Renal US  Metabolic acidosis - with respiratory compensation  Sinus tachycardia - due to acute illness- avoid b blockers and treating underlying issues  Hyponatremia - SIADH?- checked U osm and sodium and  S osm- they are consistent with SIADH- barely drinking fluids therefore already fluid restricted  Severe malnutrition - ensure and boost ordered - add Megace for appetite  Anemia  - of acute illness - transfused 1 U PRBC  but IV leaked prior to bag finishing-  she received about 2/3 of it-  Hb still low - given another Unit of blood 2/12 - anemia panel reveals normal Iron and Folate levels   Hypokalemia- -replaced  Thrombocytopenia- - acute since 2/3 likely due to sepsis and improving  Bilateral Pulmonary embolism -- patient was diagnosed with bilateral lower lobe PEs in December 2017 and has been on Eliquis - have d/c'd this as septic emboli do not need to be treated with anticoagulation- PCCM in agreement   Polysubstance abuse - patient has ongoing IV drug abuse - UDS positive for cocaine, opiates, benzodiazepines.    DVT prophylaxis:  Apixaban  Procedures: 2 D ECHO  When compared to the prior study from 03/21/2016 the vegetation   on the tricuspid valve appears larger, now measuring 22 x 16 mm.   There is severe tricuspid regurgitation and severe pulmonary   hypertension.   There is no vegetation seen on the aortic, mitral or pulmonic   valves. Antimicrobials:  Anti-infectives    Start     Dose/Rate Route Frequency Ordered Stop   05/15/16 1600  linezolid (ZYVOX) IVPB 600 mg     600 mg 300 mL/hr over 60 Minutes Intravenous Every 12 hours 05/15/16 1445     05/11/16 1800  DAPTOmycin (CUBICIN) 350 mg in sodium chloride 0.9 % IVPB  Status:  Discontinued     350 mg 214 mL/hr over 30 Minutes Intravenous Every 24 hours 05/11/16 1730 05/15/16 1445   05/09/16 2200  vancomycin (VANCOCIN) IVPB 750 mg/150 ml premix  Status:  Discontinued     750 mg 150 mL/hr over 60 Minutes Intravenous Every 24 hours 05/09/16 2143 05/11/16 1708   05/07/16 2000  vancomycin (VANCOCIN) 500 mg in sodium chloride 0.9 % 100 mL IVPB  Status:  Discontinued     500 mg 100 mL/hr over 60 Minutes Intravenous Every 24 hours 05/11/2016 1824 05/09/16 2143   05/07/16 2000  ceFEPIme (MAXIPIME) 1 g in dextrose 5 % 50 mL IVPB  Status:  Discontinued     1 g 100 mL/hr over 30 Minutes Intravenous Every 24 hours 11-May-2016 1824 05/07/16 1546   05-11-2016 1800  ceFEPIme  (MAXIPIME) 2 g in dextrose 5 % 50 mL IVPB     2 g 100 mL/hr over 30 Minutes Intravenous  Once 2016/05/11 1754 05/11/2016 1944   05-11-2016 1800  vancomycin (VANCOCIN) IVPB 1000 mg/200 mL premix     1,000 mg 200 mL/hr over 60 Minutes Intravenous  Once 05-11-16 1754 2016-05-11 2030       Objective: Vitals:   05/15/16 1620 05/15/16 2131 05/16/16 0400 05/16/16 0937  BP: (!) 128/95 122/90 123/89 116/72  Pulse: (!) 131 100 100 (!) 107  Resp: (!) 24 (!) 22 20 18   Temp: 100.3 F (37.9 C) 99.9 F (37.7 C) 99.2 F (37.3 C)   TempSrc: Oral Oral Oral   SpO2: 96% 97% 98% 97%  Weight:      Height:        Intake/Output Summary (Last 24 hours) at 05/16/16 1111 Last data filed at 05/16/16 0850  Gross per 24 hour  Intake             1675 ml  Output  850 ml  Net              825 ml   Filed Weights   05/12/16 0451 05/13/16 0651 05/14/16 0420  Weight: 43.1 kg (95 lb 0.3 oz) 42.9 kg (94 lb 9.2 oz) 41.6 kg (91 lb 11.4 oz)    Examination: General exam: Appears very uncomfortable, cachectic  HEENT: PERRLA, oral mucosa moist, no sclera icterus or thrush Respiratory system: Clear to auscultation. Respiratory effort normal.- severe cough Cardiovascular system: S1 & S2 heard, RRR.  No murmurs  Gastrointestinal system: Abdomen soft, non-tender, nondistended. Normal bowel sound. No organomegaly Central nervous system: Alert and oriented. No focal neurological deficits. Extremities: No cyanosis, clubbing or edema Skin: No rashes or ulcers Psychiatry:  Mood & affect appropriate.     Data Reviewed: I have personally reviewed following labs and imaging studies  CBC:  Recent Labs Lab 05/12/16 0540 05/13/16 0514 05/14/16 1110 05/15/16 0459 05/16/16 0528  WBC 14.9* 11.0* 10.1 8.6 8.8  HGB 7.2* 7.4* 7.8* 7.6* 8.2*  HCT 22.6* 22.9* 24.3* 24.2* 25.3*  MCV 84.0 84.2 84.4 85.5 84.3  PLT 109* 113* 174 180 189   Basic Metabolic Panel:  Recent Labs Lab 05/11/16 0545 05/12/16 0540  05/13/16 0514 05/15/16 0459 05/16/16 0528  NA  --  133* 134* 134* 132*  K  --  4.4 4.3 4.3 4.5  CL  --  108 110 111 107  CO2  --  18* 17* 16* 18*  GLUCOSE  --  101* 107* 125* 102*  BUN  --  27* 21* 16 24*  CREATININE 1.50* 1.31* 1.23* 1.05* 1.22*  CALCIUM  --  7.5* 7.6* 8.0* 8.0*   GFR: Estimated Creatinine Clearance: 45.1 mL/min (by C-G formula based on SCr of 1.22 mg/dL (H)). Liver Function Tests: No results for input(s): AST, ALT, ALKPHOS, BILITOT, PROT, ALBUMIN in the last 168 hours. No results for input(s): LIPASE, AMYLASE in the last 168 hours. No results for input(s): AMMONIA in the last 168 hours. Coagulation Profile: No results for input(s): INR, PROTIME in the last 168 hours. Cardiac Enzymes:  Recent Labs Lab 05/12/16 0540  CKTOTAL 7*   BNP (last 3 results) No results for input(s): PROBNP in the last 8760 hours. HbA1C: No results for input(s): HGBA1C in the last 72 hours. CBG:  Recent Labs Lab 05/12/16 0733 05/13/16 0737 05/14/16 0759 05/15/16 0733 05/16/16 0815  GLUCAP 114* 243* 104* 82 86   Lipid Profile: No results for input(s): CHOL, HDL, LDLCALC, TRIG, CHOLHDL, LDLDIRECT in the last 72 hours. Thyroid Function Tests: No results for input(s): TSH, T4TOTAL, FREET4, T3FREE, THYROIDAB in the last 72 hours. Anemia Panel:  Recent Labs  05/13/16 1309 05/14/16 1110  VITAMINB12  --  797  FOLATE  --  17.3  FERRITIN  --  632*  TIBC  --  140*  IRON  --  17*  RETICCTPCT 1.7  --    Urine analysis:    Component Value Date/Time   COLORURINE YELLOW 2016/05/14 1527   APPEARANCEUR HAZY (A) 05/14/2016 1527   LABSPEC 1.010 05-14-16 1527   PHURINE 5.0 05-14-2016 1527   GLUCOSEU NEGATIVE May 14, 2016 1527   HGBUR LARGE (A) 05-14-2016 1527   BILIRUBINUR NEGATIVE 05-14-16 1527   KETONESUR NEGATIVE 2016-05-14 1527   PROTEINUR 30 (A) May 14, 2016 1527   UROBILINOGEN 0.2 01/07/2012 1140   NITRITE NEGATIVE May 14, 2016 1527   LEUKOCYTESUR SMALL (A) May 14, 2016  1527   Sepsis Labs: @LABRCNTIP (procalcitonin:4,lacticidven:4) ) Recent Results (from the past 240 hour(s))  Urine culture     Status: Abnormal   Collection Time: 05/15/16  3:27 PM  Result Value Ref Range Status   Specimen Description URINE, RANDOM  Final   Special Requests NONE  Final   Culture (A)  Final    <10,000 COLONIES/mL INSIGNIFICANT GROWTH Performed at [facility], [address] [place], [place] [address]    Report Status 05/08/2016 FINAL  Final  Blood culture (routine x 2)     Status: Abnormal   Collection Time: 05-15-2016  5:35 PM  Result Value Ref Range Status   Specimen Description BLOOD LEFT ARM  Final   Special Requests IN PEDIATRIC BOTTLE 2CC  Final   Culture  Setup Time   Final    GRAM POSITIVE COCCI IN CLUSTERS IN PEDIATRIC BOTTLE CRITICAL RESULT CALLED TO, READ BACK BY AND VERIFIED WITH: T. GREEN PHARMD, AT 6045 05/07/16 BY Renato Shin Performed at [facility], [address], [place], [place] [address]    Culture METHICILLIN RESISTANT STAPHYLOCOCCUS AUREUS (A)  Final   Report Status 05/09/2016 FINAL  Final   Organism ID, Bacteria METHICILLIN RESISTANT STAPHYLOCOCCUS AUREUS  Final      Susceptibility   Methicillin resistant staphylococcus aureus - MIC*    CIPROFLOXACIN <=0.5 SENSITIVE Sensitive     ERYTHROMYCIN >=8 RESISTANT Resistant     GENTAMICIN <=0.5 SENSITIVE Sensitive     OXACILLIN >=4 RESISTANT Resistant     TETRACYCLINE >=16 RESISTANT Resistant     VANCOMYCIN 1 SENSITIVE Sensitive     TRIMETH/SULFA <=10 SENSITIVE Sensitive     CLINDAMYCIN >=8 RESISTANT Resistant     RIFAMPIN <=0.5 SENSITIVE Sensitive     Inducible Clindamycin NEGATIVE Sensitive     * METHICILLIN RESISTANT STAPHYLOCOCCUS AUREUS  Blood Culture ID Panel (Reflexed)     Status: Abnormal   Collection Time: 2016-05-15  5:35 PM  Result Value Ref Range Status   Enterococcus species NOT DETECTED NOT DETECTED Final   Listeria monocytogenes NOT DETECTED NOT DETECTED Final    Staphylococcus species DETECTED (A) NOT DETECTED Final    Comment: CRITICAL RESULT CALLED TO, READ BACK BY AND VERIFIED WITH: T. GREEN PHARMD, AT 1914 05/07/16 BY D. VANHOOK    Staphylococcus aureus DETECTED (A) NOT DETECTED Final    Comment: Methicillin (oxacillin)-resistant Staphylococcus aureus (MRSA). MRSA is predictably resistant to beta-lactam antibiotics (except ceftaroline). Preferred therapy is vancomycin unless clinically contraindicated. Patient requires contact precautions if  hospitalized. CRITICAL RESULT CALLED TO, READ BACK BY AND VERIFIED WITH: T. GREEN PHARMD, AT 7829 05/07/16 BY D. VANHOOK    Methicillin resistance DETECTED (A) NOT DETECTED Final    Comment: CRITICAL RESULT CALLED TO, READ BACK BY AND VERIFIED WITH: T. GREEN PHARMD, AT 5621 05/07/16 BY D. VANHOOK    Streptococcus species NOT DETECTED NOT DETECTED Final   Streptococcus agalactiae NOT DETECTED NOT DETECTED Final   Streptococcus pneumoniae NOT DETECTED NOT DETECTED Final   Streptococcus pyogenes NOT DETECTED NOT DETECTED Final   Acinetobacter baumannii NOT DETECTED NOT DETECTED Final   Enterobacteriaceae species NOT DETECTED NOT DETECTED Final   Enterobacter cloacae complex NOT DETECTED NOT DETECTED Final   Escherichia coli NOT DETECTED NOT DETECTED Final   Klebsiella oxytoca NOT DETECTED NOT DETECTED Final   Klebsiella pneumoniae NOT DETECTED NOT DETECTED Final   Proteus species NOT DETECTED NOT DETECTED Final   Serratia marcescens NOT DETECTED NOT DETECTED Final   Haemophilus influenzae NOT DETECTED NOT DETECTED Final   Neisseria meningitidis NOT DETECTED NOT DETECTED Final  Pseudomonas aeruginosa NOT DETECTED NOT DETECTED Final   Candida albicans NOT DETECTED NOT DETECTED Final   Candida glabrata NOT DETECTED NOT DETECTED Final   Candida krusei NOT DETECTED NOT DETECTED Final   Candida parapsilosis NOT DETECTED NOT DETECTED Final   Candida tropicalis NOT DETECTED NOT DETECTED Final    Comment: Performed  at [facility], [address], [place], [place] [address]  Blood culture (routine x 2)     Status: None   Collection Time: 05/05/2016  6:18 PM  Result Value Ref Range Status   Specimen Description BLOOD RIGHT NECK  Final   Special Requests IN PEDIATRIC BOTTLE 2CC  Final   Culture   Final    NO GROWTH 5 DAYS Performed at [facility], [address], [place], [place] [address]    Report Status 05/11/2016 FINAL  Final  MRSA PCR Screening     Status: None   Collection Time: 05/07/16 12:57 AM  Result Value Ref Range Status   MRSA by PCR NEGATIVE NEGATIVE Final    Comment:        The GeneXpert MRSA Assay (FDA approved for NASAL specimens only), is one component of a comprehensive MRSA colonization surveillance program. It is not intended to diagnose MRSA infection nor to guide or monitor treatment for MRSA infections.   Acid Fast Smear (AFB)     Status: None   Collection Time: 05/07/16  1:20 PM  Result Value Ref Range Status   AFB Specimen Processing Concentration  Final   Acid Fast Smear Negative  Final    Comment: (NOTE) Performed At: [facility] [address] [place], [place] [id number] Mila Homer MD [id number]    Source (AFB) SPUTUM  Final  Culture, sputum-assessment     Status: None   Collection Time: 05/07/16  1:22 PM  Result Value Ref Range Status   Specimen Description SPUTUM  Final   Special Requests Immunocompromised  Final   Sputum evaluation THIS SPECIMEN IS ACCEPTABLE FOR SPUTUM CULTURE  Final   Report Status 05/07/2016 FINAL  Final  Culture, respiratory (NON-Expectorated)     Status: None   Collection Time: 05/07/16  1:22 PM  Result Value Ref Range Status   Specimen Description SPUTUM  Final   Special Requests Immunocompromised Reflexed from [id number]  Final   Gram Stain   Final    ABUNDANT WBC PRESENT, PREDOMINANTLY PMN MODERATE YEAST MODERATE GRAM POSITIVE RODS Performed at [facility], [address]  [place], [place] [address]    Culture MODERATE STAPHYLOCOCCUS AUREUS  Final   Report Status 05/10/2016 FINAL  Final   Organism ID, Bacteria STAPHYLOCOCCUS AUREUS  Final      Susceptibility   Staphylococcus aureus - MIC*    CIPROFLOXACIN <=0.5 SENSITIVE Sensitive     ERYTHROMYCIN >=8 RESISTANT Resistant     GENTAMICIN <=0.5 SENSITIVE Sensitive     OXACILLIN 0.5 SENSITIVE Sensitive     TETRACYCLINE >=16 RESISTANT Resistant     VANCOMYCIN 1 SENSITIVE Sensitive     TRIMETH/SULFA <=10 SENSITIVE Sensitive     CLINDAMYCIN >=8 RESISTANT Resistant     RIFAMPIN <=0.5 SENSITIVE Sensitive     Inducible Clindamycin NEGATIVE Sensitive     * MODERATE STAPHYLOCOCCUS AUREUS  Culture, blood (Routine X 2) w Reflex to ID Panel     Status: Abnormal   Collection Time: 05/08/16  6:25 AM  Result Value Ref Range Status   Specimen Description BLOOD LEFT ARM  Final  Special Requests IN PEDIATRIC BOTTLE 2CC  Final   Culture  Setup Time   Final    GRAM POSITIVE COCCI IN CLUSTERS AEROBIC BOTTLE ONLY CRITICAL VALUE NOTED.  VALUE IS CONSISTENT WITH PREVIOUSLY REPORTED AND CALLED VALUE.    Culture (A)  Final    STAPHYLOCOCCUS AUREUS SUSCEPTIBILITIES PERFORMED ON PREVIOUS CULTURE WITHIN THE LAST 5 DAYS. Performed at [facility], [address], [place], [place] [address]    Report Status 05/11/2016 FINAL  Final  Culture, blood (routine x 2)     Status: Abnormal   Collection Time: 05/08/16  5:59 PM  Result Value Ref Range Status   Specimen Description BLOOD LEFT HAND  Final   Special Requests IN PEDIATRIC BOTTLE 1 CC  Final   Culture  Setup Time   Final    GRAM POSITIVE COCCI IN CLUSTERS IN PEDIATRIC BOTTLE CRITICAL RESULT CALLED TO, READ BACK BY AND VERIFIED WITH: PHARMD J GADHIA [id number] MLM    Culture (A)  Final    STAPHYLOCOCCUS AUREUS SUSCEPTIBILITIES PERFORMED ON PREVIOUS CULTURE WITHIN THE LAST 5 DAYS. Performed at [facility], [address] [place], [place] [address]    Report  Status 05/11/2016 FINAL  Final  Culture, blood (routine x 2)     Status: Abnormal   Collection Time: 05/08/16  5:59 PM  Result Value Ref Range Status   Specimen Description BLOOD LEFT ARM  Final   Special Requests IN PEDIATRIC BOTTLE 1.5CC  Final   Culture  Setup Time   Final    GRAM POSITIVE COCCI IN CLUSTERS AEROBIC BOTTLE ONLY CRITICAL VALUE NOTED.  VALUE IS CONSISTENT WITH PREVIOUSLY REPORTED AND CALLED VALUE.    Culture (A)  Final    STAPHYLOCOCCUS AUREUS SUSCEPTIBILITIES PERFORMED ON PREVIOUS CULTURE WITHIN THE LAST 5 DAYS. Performed at [facility], [address] [place], [place] [address]    Report Status 05/11/2016 FINAL  Final  Culture, blood (Routine X 2) w Reflex to ID Panel     Status: Abnormal   Collection Time: 05/08/16  9:49 PM  Result Value Ref Range Status   Specimen Description BLOOD LEFT HAND  Final   Special Requests IN PEDIATRIC BOTTLE 0.5CC  Final   Culture  Setup Time   Final    GRAM POSITIVE COCCI IN CLUSTERS IN PEDIATRIC BOTTLE CRITICAL RESULT CALLED TO, READ BACK BY AND VERIFIED WITH: BETH GREEN,PHARMD @2339  002/05/18 MKELLY,MLT Performed at [facility], [address] [place], [place] [address]    Culture METHICILLIN RESISTANT STAPHYLOCOCCUS AUREUS (A)  Final   Report Status 05/11/2016 FINAL  Final   Organism ID, Bacteria METHICILLIN RESISTANT STAPHYLOCOCCUS AUREUS  Final      Susceptibility   Methicillin resistant staphylococcus aureus - MIC*    CIPROFLOXACIN <=0.5 SENSITIVE Sensitive     ERYTHROMYCIN >=8 RESISTANT Resistant     GENTAMICIN <=0.5 SENSITIVE Sensitive     OXACILLIN >=4 RESISTANT Resistant     TETRACYCLINE >=16 RESISTANT Resistant     VANCOMYCIN <=0.5 SENSITIVE Sensitive     TRIMETH/SULFA <=10 SENSITIVE Sensitive     CLINDAMYCIN >=8 RESISTANT Resistant     RIFAMPIN <=0.5 SENSITIVE Sensitive     Inducible Clindamycin NEGATIVE Sensitive     * METHICILLIN RESISTANT STAPHYLOCOCCUS AUREUS  Acid Fast Smear (AFB)      Status: None   Collection Time: 05/10/16  6:09 AM  Result Value Ref Range Status   AFB Specimen Processing Concentration  Final  Acid Fast Smear Negative  Final    Comment: (NOTE) Performed At: [facility] [address] [place], [place] [id number] Mila HomerHancock William F MD WU:[id number]:[phone]    Source (AFB) SPUTUM  Final  Culture, blood (routine x 2)     Status: Abnormal   Collection Time: 05/10/16 11:18 AM  Result Value Ref Range Status   Specimen Description BLOOD RIGHT ARM  Final   Special Requests IN PEDIATRIC BOTTLE 3CC  Final   Culture  Setup Time   Final    GRAM POSITIVE COCCI IN CLUSTERS IN PEDIATRIC BOTTLE CRITICAL VALUE NOTED.  VALUE IS CONSISTENT WITH PREVIOUSLY REPORTED AND CALLED VALUE.    Culture (A)  Final    STAPHYLOCOCCUS AUREUS SUSCEPTIBILITIES PERFORMED ON PREVIOUS CULTURE WITHIN THE LAST 5 DAYS. Performed at [facility], [address] [place], [place] [address]    Report Status 05/12/2016 FINAL  Final  Culture, blood (routine x 2)     Status: None (Preliminary result)   Collection Time: 05/13/16 10:46 AM  Result Value Ref Range Status   Specimen Description BLOOD LEFT ARM  Final   Special Requests IN PEDIATRIC BOTTLE 1CC  Final   Culture   Final    NO GROWTH 2 DAYS Performed at [facility], [address] [place], [place] [address]    Report Status PENDING  Incomplete  Culture, blood (routine x 2)     Status: None (Preliminary result)   Collection Time: 05/13/16 10:49 AM  Result Value Ref Range Status   Specimen Description BLOOD RIGHT HAND  Final   Special Requests IN PEDIATRIC BOTTLE 3CC  Final   Culture   Final    NO GROWTH 2 DAYS Performed at [facility], [address] [place], [place] [address]    Report Status PENDING  Incomplete         Radiology Studies: Dg Chest Port 1 View  Result Date: 05/15/2016 CLINICAL DATA:  Pt with significant for drug abuse with heroin, cocaine, tobacco, asthma endocarditis  involving tricuspid valve and nonadherence with her treatment plan now presenting to the emergency department with generalized aches and pains. EXAM: PORTABLE CHEST 1 VIEW COMPARISON:  CT chest 04/01/17 FINDINGS: Patchy areas of airspace disease involving bilateral upper and lower lobes in a peripheral distribution with cavitation in the right upper lobe and right lower lobe opacities consistent with septic emboli. Small right pleural effusion. No pneumothorax. Stable cardiomediastinal silhouette. Right peritracheal lymphadenopathy. No acute osseous abnormality. IMPRESSION: 1. Worsening patchy areas of airspace disease involving bilateral upper and lower lobes in a peripheral distribution with cavitation in the right upper lobe and right lower lobe opacities consistent with septic emboli. Electronically Signed   By: Elige KoHetal  Patel   On: 05/15/2016 12:40      Scheduled Meds: . benzonatate  200 mg Oral TID  . chlorhexidine  15 mL Mouth Rinse BID  . chlorpheniramine-HYDROcodone  5 mL Oral Q12H  . enoxaparin (LOVENOX) injection  40 mg Subcutaneous Q24H  . feeding supplement  1 Container Oral BID BM  . feeding supplement (ENSURE ENLIVE)  237 mL Oral TID BM  . linezolid (ZYVOX) IV  600 mg Intravenous Q12H  . mouth rinse  15 mL Mouth Rinse q12n4p  . megestrol  400 mg Oral Daily  . multivitamin with minerals  1 tablet Oral Daily  . oxyCODONE  30 mg Oral Q12H  . sodium chloride flush  3 mL Intravenous Q12H   Continuous Infusions:  LOS: 10 days    Time spent in minutes: 35    Elizeth Weinrich, MD [facility] Pager: [email or web address] Password [email or web address] 05/16/2016, 11:11 AM

## 2016-05-16 NOTE — Care Management Note (Signed)
Case Management Note  Patient Details  Name: Catherine Hernandez MRN: [id number] Date of Birth: November 01, 1985  Subjective/Objective: Pt admitted with Endocarditis of tricuspid valve                   Action/Plan: Plan to D/C home   Expected Discharge Date:   05/22/2016               Expected Discharge Plan:  Home/Self Care  In-House Referral:  NA  Discharge planning Services  CM Consult, [facility] Program, [facility]igent [facility]  Post Acute Care Choice:  NA Choice offered to:  NA  DME Arranged:  N/A DME Agency:  NA  HH Arranged:  NA HH Agency:  [facility]  Status of Service:     If discussed at Long Length of Stay Meetings, dates discussed:    Additional CommentsGeni Bers:  Dyesha Henault, RN 05/16/2016, 3:28 PM

## 2016-05-16 NOTE — Progress Notes (Signed)
[facility] Attending Note  Presenting HPI:  31 y.o. 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 Patient found to have what appeared to be septic emboli on CT angiogram in December and was started on Xarelto for systemic anticoagulation. No known history of DVT on venous duplex. Patient also has evidence of septic emboli with cavitation. 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  Subjective:  Patient endorsing continued cough which is productive of a purulent phlegm. He reports cough is slightly better today. Denies any dyspnea at rest. Continuing to have predominantly pleuritic chest pain. Reports pain is somewhat better and that the currently prescribed narcotic medications seem to provide some relief.  Review of Systems:  Denies any abdominal pain or nausea. Patient is having intermittent chills with some subjective fever. No new headache or vision changes.  Temp:  [98.8 F (37.1 C)-100.3 F (37.9 C)] 99.2 F (37.3 C) (02/13 0400) Pulse Rate:  [100-131] 107 (02/13 0937) Resp:  [18-24] 18 (02/13 0937) BP: (116-134)/(72-95) 116/72 (02/13 0937) SpO2:  [93 %-98 %] 97 % (02/13 0937)  General:  Awake. Comfortable. Alert. Laying on right side. Integument:  Warm & dry. No rash on exposed skin. HEENT:  No scleral icterus or injection. Pupils symmetric.Tacky mucous membranes. Pulmonary:  Clear bilaterally to auscultation. No accessory muscle use. Good aeration bilaterally. Intermittent cough noted again. Cardiovascular:  Regular rhythm. No JVD apprecaited. No edema. Abdomen:  Soft. Nontender. Normal bowel sounds. Neurological:  Cranial nerves grossly in tact. No meningismus. Moving all 4 extremities equally. Oriented x4.   CBC Latest Ref Rng  & Units 05/16/2016 05/15/2016 05/14/2016  WBC 4.0 - 10.5 K/uL 8.8 8.6 10.1  Hemoglobin 12.0 - 15.0 g/dL 8.2(L) 7.6(L) 7.8(L)  Hematocrit 36.0 - 46.0 % 25.3(L) 24.2(L) 24.3(L)  Platelets 150 - 400 K/uL 189 180 174    BMP Latest Ref Rng & Units 05/16/2016 05/15/2016 05/13/2016  Glucose 65 - 99 mg/dL 161(W102(H) 960(A125(H) 540(J107(H)  BUN 6 - 20 mg/dL 81(X24(H) 16 91(Y21(H)  Creatinine 0.44 - 1.00 mg/dL 7.82(N1.22(H) 5.62(Z1.05(H) 3.08(M1.23(H)  Sodium 135 - 145 mmol/L 132(L) 134(L) 134(L)  Potassium 3.5 - 5.1 mmol/L 4.5 4.3 4.3  Chloride 101 - 111 mmol/L 107 111 110  CO2 22 - 32 mmol/L 18(L) 16(L) 17(L)  Calcium 8.9 - 10.3 mg/dL 8.0(L) 8.0(L) 7.6(L)    IMAGING/STUDIES: CTA CHEST 03/22/16:  Per radiologist. Bilateral lower lobe pulmonary emboli with taper. Since which can be seen with chronic pulmonary emboli or possible vasculopathy secondary to septic emboli. Multiple cavitating and non-cavitating pneumonia/consolidation of varying ages consistent with septic emboli. Trace pleural effusions. Mild cardiomegaly with dilated right atrium. Suspect splenomegaly.  VENOUS DUPLEX BILATERAL LOWER EXTREMITIES 03/23/16: No evidence of DVT or SVT involving right or left lower extremity.  CT CHEST W/O 05/17/2016:  Previously reviewed by me. No pleural effusion or thickening. No pericardial effusion. Multiple cavitary nodules and nodular opacities throughout all lung zones. Precarinal lymphadenopathy with significant enlargement up to 1.5 cm in short axis by my review.  TTE 05/08/16:  LV normal in size with EF 60-65% & no regional wall motion abnormalities. LA normal in size. RA severely dilated. RV moderately dilated with preserved systolic function. No aortic stenosis or regurgitation. Aortic root normal in size. No mitral stenosis or regurgitation. No pulmonic stenosis or regurgitation.  Severe tricuspid regurgitation. No pericardial effusion.  MICROBIOLOGY: HIV 2/3:  Nonreactive Blood Cultures x2 2/3:  1/2 Bottles MRSA Urine Culture 2/3:   <10,000 Colonies insignificant growth Sputum Culture 2/4:  Moderate MSSA Sputum AFB Smear/Culture 2/4 >>>  Smear Negative / Culture pending Blood Culture x4 2/5:  4/4 Positive MRSA Sputum AFB Smear/Culture 2/7 >>> Smear Negative / Culture pending Blood Culture x1 2/7:  Staphylococcus aureus Influenza A/B PCR 2/8:  Negative   ANTIBIOTICS: Cefepime 2/3 (x1 dose) Vancomycin 2/3 - 2/8 Daptomycin 2/8 - 2/12 Zyvox 2/12 >>>  ASSESSMENT/PLAN:  31 y.o. female with history of IV drug use presenting with MRSA bacteremia going back to August 2017. Patient does have a history of tricuspid valve vegetations which are no longer present and likely the cause for the previously noted septic emboli. Patient has transitioned over to a nasal without adverse effect. Her chest discomfort seemed to be slightly better today which is most certainly secondary to her multifocal cavitary pneumonia.  1. Pulmonary emboli: Likely septic emboli from tricuspid valve vegetations. No indication for systemic anticoagulation at this time. 2. MRSA bacteremia: Infectious diseases following & guiding antibiotics. Continuing on Zyvox. 3. Staphylococcus aureus multifocal and cavitary pneumonia: Continuing Zyvox day #2 of treatment. 4. Chest pain: Pain management per palliative medicine. Holding on further NSAIDs in light of rising BUN and creatinine.  Remainder of care as per primary service.  I have spent a total of 36 minutes of time today caring for the patient, reviewing the patient's electronic medical record, and with more than 50% of that time spent coordinating care with the patient's transfer as well as reviewing the continuing plan of care with the  patient at bedside.  Donna Christen Jamison Neighbor, M.D. [facility] Pager:  [phone] After 3pm or if no response, call [phone] 10:45 AM 05/16/16

## 2016-05-17 ENCOUNTER — Inpatient Hospital Stay (HOSPITAL_COMMUNITY): Payer: Medicaid Other | Admitting: Anesthesiology

## 2016-05-17 ENCOUNTER — Inpatient Hospital Stay (HOSPITAL_COMMUNITY): Payer: Medicaid Other

## 2016-05-17 LAB — COMPREHENSIVE METABOLIC PANEL
ALT: 9 U/L — ABNORMAL LOW (ref 14–54)
AST: 23 U/L (ref 15–41)
Albumin: 1.7 g/dL — ABNORMAL LOW (ref 3.5–5.0)
Alkaline Phosphatase: 112 U/L (ref 38–126)
Anion gap: 5 (ref 5–15)
BUN: 27 mg/dL — ABNORMAL HIGH (ref 6–20)
CO2: 20 mmol/L — ABNORMAL LOW (ref 22–32)
Calcium: 7.9 mg/dL — ABNORMAL LOW (ref 8.9–10.3)
Chloride: 108 mmol/L (ref 101–111)
Creatinine, Ser: 1.25 mg/dL — ABNORMAL HIGH (ref 0.44–1.00)
GFR calc Af Amer: >60 mL/min (ref >60)
GFR calc non Af Amer: 58 mL/min — ABNORMAL LOW (ref >60)
Glucose, Bld: 191 mg/dL — ABNORMAL HIGH (ref 65–99)
Potassium: 5.5 mmol/L — ABNORMAL HIGH (ref 3.5–5.1)
Sodium: 133 mmol/L — ABNORMAL LOW (ref 135–145)
Total Bilirubin: 1.1 mg/dL (ref 0.3–1.2)
Total Protein: 8.4 g/dL — ABNORMAL HIGH (ref 6.5–8.1)

## 2016-05-17 LAB — BLOOD GAS, ARTERIAL
Acid-base deficit: 9.4 mmol/L — ABNORMAL HIGH (ref 0.0–2.0)
Bicarbonate: 18.7 mmol/L — ABNORMAL LOW (ref 20.0–28.0)
Drawn by: 235321
FIO2: 100
MECHVT: 460 mL
O2 Saturation: 99.4 %
PEEP: 5 cmH2O
Patient temperature: 98.6
RATE: 16 resp/min
pCO2 arterial: 55.9 mmHg — ABNORMAL HIGH (ref 32.0–48.0)
pH, Arterial: 7.151 — CL (ref 7.350–7.450)
pO2, Arterial: 441 mmHg — ABNORMAL HIGH (ref 83.0–108.0)

## 2016-05-17 LAB — CBC
HCT: 28.4 % — ABNORMAL LOW (ref 36.0–46.0)
HCT: 27.3 % — ABNORMAL LOW (ref 36.0–46.0)
HCT: 23.5 % — ABNORMAL LOW (ref 36.0–46.0)
HEMATOCRIT: 25.4 % — AB (ref 36.0–46.0)
HEMOGLOBIN: 7.9 g/dL — AB (ref 12.0–15.0)
Hemoglobin: 9 g/dL — ABNORMAL LOW (ref 12.0–15.0)
Hemoglobin: 8.7 g/dL — ABNORMAL LOW (ref 12.0–15.0)
Hemoglobin: 7.4 g/dL — ABNORMAL LOW (ref 12.0–15.0)
MCH: 27.9 pg (ref 26.0–34.0)
MCH: 27.9 pg (ref 26.0–34.0)
MCH: 27.6 pg (ref 26.0–34.0)
MCH: 27 pg (ref 26.0–34.0)
MCHC: 31.7 g/dL (ref 30.0–36.0)
MCHC: 31.9 g/dL (ref 30.0–36.0)
MCHC: 31.5 g/dL (ref 30.0–36.0)
MCHC: 31.1 g/dL (ref 30.0–36.0)
MCV: 87.9 fL (ref 78.0–100.0)
MCV: 87.5 fL (ref 78.0–100.0)
MCV: 87.7 fL (ref 78.0–100.0)
MCV: 86.7 fL (ref 78.0–100.0)
PLATELETS: 165 10*3/uL (ref 150–400)
Platelets: 253 10*3/uL (ref 150–400)
Platelets: 222 10*3/uL (ref 150–400)
Platelets: 202 10*3/uL (ref 150–400)
RBC: 3.23 MIL/uL — ABNORMAL LOW (ref 3.87–5.11)
RBC: 3.12 MIL/uL — ABNORMAL LOW (ref 3.87–5.11)
RBC: 2.68 MIL/uL — AB (ref 3.87–5.11)
RBC: 2.93 MIL/uL — ABNORMAL LOW (ref 3.87–5.11)
RDW: 18.3 % — ABNORMAL HIGH (ref 11.5–15.5)
RDW: 18.4 % — ABNORMAL HIGH (ref 11.5–15.5)
RDW: 18.6 % — ABNORMAL HIGH (ref 11.5–15.5)
RDW: 18.6 % — ABNORMAL HIGH (ref 11.5–15.5)
WBC: 23.6 10*3/uL — ABNORMAL HIGH (ref 4.0–10.5)
WBC: 19.8 10*3/uL — ABNORMAL HIGH (ref 4.0–10.5)
WBC: 10.1 10*3/uL (ref 4.0–10.5)
WBC: 11.6 10*3/uL — AB (ref 4.0–10.5)

## 2016-05-17 LAB — BLOOD GAS, VENOUS
Acid-base deficit: 8.3 mmol/L — ABNORMAL HIGH (ref 0.0–2.0)
BICARBONATE: 17.7 mmol/L — AB (ref 20.0–28.0)
Drawn by: 235321
FIO2: 40
LHR: 24 {breaths}/min
MECHVT: 460 mL
O2 Saturation: 98.4 %
PATIENT TEMPERATURE: 98.6
PEEP/CPAP: 5 cmH2O
PO2 VEN: 150 mmHg — AB (ref 32.0–45.0)
pCO2, Ven: 40.9 mmHg — ABNORMAL LOW (ref 44.0–60.0)
pH, Ven: 7.26 (ref 7.250–7.430)

## 2016-05-17 LAB — BASIC METABOLIC PANEL
ANION GAP: 6 (ref 5–15)
BUN: 35 mg/dL — ABNORMAL HIGH (ref 6–20)
CO2: 16 mmol/L — ABNORMAL LOW (ref 22–32)
Calcium: 7.7 mg/dL — ABNORMAL LOW (ref 8.9–10.3)
Chloride: 112 mmol/L — ABNORMAL HIGH (ref 101–111)
Creatinine, Ser: 1.39 mg/dL — ABNORMAL HIGH (ref 0.44–1.00)
GFR, EST AFRICAN AMERICAN: 59 mL/min — AB (ref >60)
GFR, EST NON AFRICAN AMERICAN: 51 mL/min — AB (ref >60)
Glucose, Bld: 78 mg/dL (ref 65–99)
POTASSIUM: 6 mmol/L — AB (ref 3.5–5.1)
SODIUM: 134 mmol/L — AB (ref 135–145)

## 2016-05-17 LAB — LACTIC ACID, PLASMA
LACTIC ACID, VENOUS: 1.1 mmol/L (ref 0.5–1.9)
Lactic Acid, Venous: 1.5 mmol/L (ref 0.5–1.9)

## 2016-05-17 LAB — URINALYSIS, ROUTINE W REFLEX MICROSCOPIC
Bilirubin Urine: NEGATIVE
Glucose, UA: 150 mg/dL — AB
Ketones, ur: NEGATIVE mg/dL
Leukocytes, UA: NEGATIVE
Nitrite: NEGATIVE
Protein, ur: 100 mg/dL — AB
Specific Gravity, Urine: 1.012 (ref 1.005–1.030)
Squamous Epithelial / LPF: NONE SEEN
pH: 5 (ref 5.0–8.0)

## 2016-05-17 LAB — MAGNESIUM: Magnesium: 1.6 mg/dL — ABNORMAL LOW (ref 1.7–2.4)

## 2016-05-17 LAB — GLUCOSE, CAPILLARY: GLUCOSE-CAPILLARY: 71 mg/dL (ref 65–99)

## 2016-05-17 LAB — TROPONIN I
Troponin I: <0.03 ng/mL (ref <0.03)
Troponin I: 0.03 ng/mL (ref <0.03)

## 2016-05-17 MED ORDER — MEGESTROL ACETATE 400 MG/10ML PO SUSP
400.0000 mg | Freq: Every day | ORAL | Status: DC
  Administered 2016-05-17 – 2016-05-18 (×2): 400 mg
  Filled 2016-05-17 (×2): qty 10

## 2016-05-17 MED ORDER — ETOMIDATE 2 MG/ML IV SOLN
0.3000 mg/kg | Freq: Once | INTRAVENOUS | Status: AC
  Administered 2016-05-17: 12.48 mg via INTRAVENOUS

## 2016-05-17 MED ORDER — FENTANYL CITRATE (PF) 100 MCG/2ML IJ SOLN
100.0000 ug | Freq: Once | INTRAMUSCULAR | Status: AC
  Administered 2016-05-17: 100 ug via INTRAVENOUS

## 2016-05-17 MED ORDER — PANTOPRAZOLE SODIUM 40 MG IV SOLR
40.0000 mg | INTRAVENOUS | Status: DC
  Administered 2016-05-17: 40 mg via INTRAVENOUS
  Filled 2016-05-17: qty 40

## 2016-05-17 MED ORDER — VECURONIUM BROMIDE 10 MG IV SOLR: 10.0000 mg | INTRAVENOUS | Status: DC | PRN

## 2016-05-17 MED ORDER — ACETAMINOPHEN 160 MG/5ML PO SOLN: 650.0000 mg | Freq: Four times a day (QID) | ORAL | Status: DC | PRN

## 2016-05-17 MED ORDER — LORAZEPAM 2 MG/ML IJ SOLN: 2.0000 mg | INTRAMUSCULAR | Status: DC | PRN

## 2016-05-17 MED ORDER — SODIUM POLYSTYRENE SULFONATE 15 GM/60ML PO SUSP
30.0000 g | Freq: Once | ORAL | Status: AC
  Administered 2016-05-17: 30 g
  Filled 2016-05-17: qty 120

## 2016-05-17 MED ORDER — FAMOTIDINE IN NACL 20-0.9 MG/50ML-% IV SOLN: 20.0000 mg | Freq: Every day | INTRAVENOUS | Status: DC

## 2016-05-17 MED ORDER — SODIUM CHLORIDE 0.9 % IV BOLUS (SEPSIS)
500.0000 mL | Freq: Once | INTRAVENOUS | Status: AC
  Administered 2016-05-17: 500 mL via INTRAVENOUS

## 2016-05-17 MED ORDER — ADULT MULTIVITAMIN LIQUID CH
15.0000 mL | Freq: Every day | ORAL | Status: DC
  Administered 2016-05-17 – 2016-05-25 (×8): 15 mL
  Filled 2016-05-17 (×9): qty 15

## 2016-05-17 MED ORDER — ORAL CARE MOUTH RINSE
15.0000 mL | Freq: Four times a day (QID) | OROMUCOSAL | Status: DC
  Administered 2016-05-17 – 2016-05-27 (×35): 15 mL via OROMUCOSAL

## 2016-05-17 MED ORDER — FENTANYL CITRATE (PF) 100 MCG/2ML IJ SOLN
50.0000 ug | Freq: Once | INTRAMUSCULAR | Status: AC
  Administered 2016-05-17: 50 ug via INTRAVENOUS

## 2016-05-17 MED ORDER — HYDROCOD POLST-CPM POLST ER 10-8 MG/5ML PO SUER
5.0000 mL | Freq: Two times a day (BID) | ORAL | Status: DC
  Administered 2016-05-17 – 2016-05-21 (×9): 5 mL
  Filled 2016-05-17 (×9): qty 5

## 2016-05-17 MED ORDER — PROPOFOL 10 MG/ML IV BOLUS
INTRAVENOUS | Status: DC | PRN
  Administered 2016-05-17: 50 mg via INTRAVENOUS

## 2016-05-17 MED ORDER — MIDAZOLAM HCL 2 MG/2ML IJ SOLN
2.0000 mg | INTRAMUSCULAR | Status: DC | PRN
  Administered 2016-05-17: 2 mg via INTRAVENOUS
  Filled 2016-05-17: qty 2

## 2016-05-17 MED ORDER — MIDAZOLAM HCL 2 MG/2ML IJ SOLN
2.0000 mg | Freq: Once | INTRAMUSCULAR | Status: AC
  Administered 2016-05-17: 2 mg via INTRAVENOUS

## 2016-05-17 MED ORDER — SUCCINYLCHOLINE CHLORIDE 20 MG/ML IJ SOLN
INTRAMUSCULAR | Status: DC | PRN
  Administered 2016-05-17: 120 mg via INTRAVENOUS

## 2016-05-17 MED ORDER — PANTOPRAZOLE SODIUM 40 MG IV SOLR
40.0000 mg | Freq: Two times a day (BID) | INTRAVENOUS | Status: DC
  Administered 2016-05-17 – 2016-05-18 (×2): 40 mg via INTRAVENOUS
  Filled 2016-05-17 (×2): qty 40

## 2016-05-17 MED ORDER — MIDAZOLAM HCL 2 MG/2ML IJ SOLN
2.0000 mg | INTRAMUSCULAR | Status: DC | PRN
  Administered 2016-05-17 (×2): 2 mg via INTRAVENOUS
  Filled 2016-05-17: qty 2

## 2016-05-17 MED ORDER — SODIUM CHLORIDE 0.9 % IV SOLN
0.0000 ug/min | INTRAVENOUS | Status: DC
  Administered 2016-05-17: 20 ug/min via INTRAVENOUS
  Administered 2016-05-18: 10 ug/min via INTRAVENOUS
  Filled 2016-05-17 (×2): qty 1

## 2016-05-17 MED ORDER — MIDAZOLAM HCL 2 MG/2ML IJ SOLN: 2.0000 mg | INTRAMUSCULAR | Status: DC | PRN

## 2016-05-17 MED ORDER — SODIUM CHLORIDE 0.9 % IV SOLN
25.0000 ug/h | INTRAVENOUS | Status: DC
  Administered 2016-05-17: 375 ug/h via INTRAVENOUS
  Administered 2016-05-17: 175 ug/h via INTRAVENOUS
  Administered 2016-05-17 – 2016-05-18 (×4): 200 ug/h via INTRAVENOUS
  Administered 2016-05-19 – 2016-05-20 (×2): 150 ug/h via INTRAVENOUS
  Administered 2016-05-21: 125 ug/h via INTRAVENOUS
  Administered 2016-05-21 – 2016-05-23 (×3): 150 ug/h via INTRAVENOUS
  Administered 2016-05-24: 100 ug/h via INTRAVENOUS
  Administered 2016-05-24: 50 ug/h via INTRAVENOUS
  Administered 2016-05-24 – 2016-05-25 (×2): 100 ug/h via INTRAVENOUS
  Filled 2016-05-17 (×15): qty 50

## 2016-05-17 MED ORDER — FENTANYL CITRATE (PF) 100 MCG/2ML IJ SOLN
INTRAMUSCULAR | Status: AC
  Administered 2016-05-17: 100 ug
  Filled 2016-05-17: qty 2

## 2016-05-17 MED ORDER — ETOMIDATE 2 MG/ML IV SOLN: 0.3000 mg/kg | Freq: Once | INTRAVENOUS | Status: AC

## 2016-05-17 MED ORDER — SODIUM CHLORIDE 0.9 % IV SOLN
1.0000 mg/h | INTRAVENOUS | Status: DC
  Administered 2016-05-17: 0.5 mg/h via INTRAVENOUS
  Administered 2016-05-18 (×2): 2 mg/h via INTRAVENOUS
  Administered 2016-05-18: 3 mg/h via INTRAVENOUS
  Administered 2016-05-18 – 2016-05-20 (×8): 2 mg/h via INTRAVENOUS
  Administered 2016-05-21: 1 mg/h via INTRAVENOUS
  Administered 2016-05-22: 3 mg/h via INTRAVENOUS
  Filled 2016-05-17 (×9): qty 10

## 2016-05-17 MED ORDER — MIDAZOLAM HCL 2 MG/2ML IJ SOLN
2.0000 mg | INTRAMUSCULAR | Status: DC | PRN
  Administered 2016-05-17 – 2016-05-22 (×5): 2 mg via INTRAVENOUS
  Filled 2016-05-17: qty 2

## 2016-05-17 MED ORDER — SODIUM CHLORIDE 0.9 % IV SOLN
25.0000 ug/h | INTRAVENOUS | Status: DC
  Administered 2016-05-17: 50 ug/h via INTRAVENOUS
  Filled 2016-05-17: qty 50

## 2016-05-17 MED ORDER — FENTANYL CITRATE (PF) 100 MCG/2ML IJ SOLN
50.0000 ug | Freq: Once | INTRAMUSCULAR | Status: DC
  Administered 2016-05-17: 50 ug via INTRAVENOUS

## 2016-05-17 MED ORDER — FENTANYL BOLUS VIA INFUSION
50.0000 ug | INTRAVENOUS | Status: DC | PRN
  Filled 2016-05-17: qty 50

## 2016-05-17 MED ORDER — CHLORHEXIDINE GLUCONATE 0.12% ORAL RINSE (MEDLINE KIT)
15.0000 mL | Freq: Two times a day (BID) | OROMUCOSAL | Status: DC
  Administered 2016-05-17 – 2016-05-25 (×16): 15 mL via OROMUCOSAL

## 2016-05-17 MED ORDER — FENTANYL BOLUS VIA INFUSION
50.0000 ug | INTRAVENOUS | Status: DC | PRN
  Administered 2016-05-17 – 2016-05-25 (×32): 50 ug via INTRAVENOUS
  Filled 2016-05-17: qty 50

## 2016-05-17 MED ORDER — DEXTROSE-NACL 5-0.9 % IV SOLN
INTRAVENOUS | Status: DC
  Administered 2016-05-17: 10:00:00 via INTRAVENOUS

## 2016-05-17 MED ORDER — GUAIFENESIN 100 MG/5ML PO SOLN: 5.0000 mL | ORAL | Status: DC | PRN

## 2016-05-17 NOTE — Progress Notes (Signed)
Date:  May 17, 2016 Chart reviewed for concurrent status and case management needs.  Patient transferred from telem. Floor to icu and intubated. Will continue to follow patient progress. Discharge Planning: following for needs Expected discharge date: [id number] Marcelle SmilingRhonda Soni Kegel, BSN, [place]3, [place]   [phone]

## 2016-05-17 NOTE — Progress Notes (Signed)
Pt alert and oriented to self, place, and situation, able to spell out responses with finger, and write responses and when asked if we could contact any family on her behalf she writes no and shakes her head no.

## 2016-05-17 NOTE — Progress Notes (Signed)
Asked by the [facility] PA to intubate patient, known IV drug abuser with tricuspid valve endocarditis, Zarelto for anticoagulation in the face of septic emboli, an unclear form of upper respiratory illness, who was obviously short of breath, breathing at 40-50 times a minute and tachycardic to 140-150s. Patient was in obvious respiratory distress and spitting up blood. O2 sat was 100%  I administered 33m of Propofol, 1269mSuccinyl Choline and intubated her easily with a #3 MAC blade and a #7.5 ETT. Good bilateral breath sounds were heard and ETCO2 was obtained.  Care was turned over to the [facility] PA and RT with the assurance CCM was going to come and see this critically ill patient.  Catherine Hernandez

## 2016-05-17 NOTE — Plan of Care (Signed)
Problem: Bowel/Gastric: Goal: Will not experience complications related to bowel motility Outcome: Not Progressing BS hypoactive

## 2016-05-17 NOTE — Progress Notes (Signed)
NO CHARGE NOTE  Chart reviewed, discussed with Catherine Hernandez from PMT who saw Catherine Hernandez last admission.  Also discussed case briefly with Catherine Hernandez.  Catherine Hernandez is currently intubated, was alert earlier today but has received versed due to agitation.  She has been clear in th past in desire for full, aggressive care despite the fact that she has serious problems that cannot be fixed.  She has a very poor social situation with her personal and family situation being entangled in poverty and addiction.    She has no clear surrogate decision maker.  This has been discussed with her in the past per chart review.  She has stated that her mother would not be who she wants to make decisions on her behalf (no contact information is even available), but she has not completed HCPOA that I can find in chart.  This may be because she does not have anyone willing to serve in this capacity.  She had told prior PMT member that she would like her significant other at that time, Catherine Hernandez, to serve as HCPOA, however he stated that he was stepping away and did not want to be her HCPOA.  It is not even clear to me if they still have any type of relationship.   This remains a very difficult situation.  Catherine Hernandez has been clear in desire for aggressive care, and plan to continue current care at this time.  Will continue to follow clinically and engage as appropriate based on clinical course over next several days.  Please feel free to call if there are specific areas where we can be of assistance.  Romie MinusGene Carnetta Losada, MD [facility] Team [phone]

## 2016-05-17 NOTE — Progress Notes (Signed)
Nutrition Follow-up  DOCUMENTATION CODES:   Severe malnutrition in context of chronic illness, Underweight  INTERVENTION:  - When TF able to be initiated, recommend Jevity 1.2 @ 15 mL/hr increase by 10 mL every 8 hours to reach goal rate of Jevity 1.2 @ 55 mL/hr. At goal rate, this regimen will provide 1584 kcal, 73 grams of protein (112% maximum estimated protein need), and 1065 mL free water.  - Recommend d/c Megace.  NUTRITION DIAGNOSIS:   Malnutrition related to chronic illness as evidenced by severe depletion of body fat, severe depletion of muscle mass. -ongoing  GOAL:   Patient will meet greater than or equal to 90% of their needs -unable to meet, previously met  MONITOR:   Vent status, Weight trends, Labs, I & O's  REASON FOR ASSESSMENT:   Ventilator  ASSESSMENT:   31 y.o.femalewith medical history significant for IV drug abuse, endocarditis involving tricuspid valve, and nonadherence with her treatment plan, now presenting to the emergency department with generalized aches and pains, malaise, productive cough, and fatigue.   2/14 Pt was intubated with OGT placed early this AM following a coughing spell with increased WOB and hemoptysis. Per Dr. Reginia Naas note and from discussion with RN, plan at this time is to start TF 2/15. TF recommendations outlined above but will adjust goal rate if D5 IVF is still infusing once goal rate of TF is reached. Estimated nutrition needs updated based on intubation and admission weight (43.1 kg) used in estimating needs as CBW is inconsistent with trends throughout hospitalization.   Patient is currently intubated on ventilator support MV: 12 L/min Temp (24hrs), Avg:99.3 F (37.4 C), Min:98.6 F (37 C), Max:99.9 F (37.7 C) Propofol: none BP: 89/51 and MAP: 62.  Medications reviewed; PRN Dulcolax, 15 mL liquid multivitamin/day, PRN Zofran, 40 mg IV Protonix BID. Labs reviewed; Na: 133 mmol/L, K: 5.5 mg/dL, BUN: 27 mg/dL, creatinine:  0.22 mg/dL, Ca: 7.9 mg/dL, Mg: 1.6 mg/dL.  IVF: D5-NS @ 75 mL/hr (306 kcal).  Drips: Fentanyl @ 325 mcg/hr, Versed @ 1 mg/hr.    2/13 - Per review, pt consumed 100% of breakfast, 10% of lunch, and 75% of dinner yesterday; today she consumed 90% of breakfast.  - These meals provided a total of ~2326 kcal and ~74 grams of protein.  - Pt has been consuming oral nutrition supplements about 50% of the time that they're offered. - Weight -1.3 kg from 2/10-2/11 and no weight recorded from yesterday or today.  - Per Dr. Karlyne Greenspan note this AM, plan for Megace to be added to assist with appetite.    2/9 - Per chart review, pt has been accepting Ensure supplements ~50% of the time.  - She consumed 75% of breakfast and 100% of lunch and dinner yesterday.  - She also ate 100% of breakfast this AM which consisted of : scrambled eggs, angel food cake, Frosted flakes, breakfast potatoes, and a Coke.  - Pt likes sweet items; will add Boost Breeze to provide more protein between meals.  - Weight has been stable since admission.  - Dr. Burna Sis note from this AM states pt with poor prognosis.    Diet Order:  Diet NPO time specified  Skin:  Reviewed, no issues  Last BM:  2/12  Height:   Ht Readings from Last 1 Encounters:  05/29/2016 5\' 5"  (1.651 m)    Weight:   Wt Readings from Last 1 Encounters:  05/17/16 100 lb 5 oz (45.5 kg)    Ideal Body Weight:  56.8 kg  BMI:  Body mass index is 16.69 kg/m.  Estimated Nutritional Needs:   Kcal:  1573  Protein:  52-65 grams (1.2-1.5 grams/kg)  Fluid:  1.5-1.7L/day  EDUCATION NEEDS:   No education needs identified at this time    Jarome Matin, MS, RD, LDN, CNSC Inpatient Clinical Dietitian Pager # [phone] After hours/weekend pager # [phone]

## 2016-05-17 NOTE — Procedures (Addendum)
Bedside Bronchoscopy Procedure Note  Unable to obtain consent as pt was sedated. Emergent medical necessity  Indication: Hemoptysis  Post Procedure Diagnosis: Acute resp failure  Location: B/L lungs  Condition pre procedure: Stable  Medications for procedure: Fentanyl gtt, etomidate  Procedure description: The bronchoscope was introduced through the endotracheal tube and passed to the bilateral lungs to the level of the subsegmental bronchi throughout the tracheobronchial tree.   Airway exam:  Left lung appears clear. There is fresh blood at the  opening to RLL. This was cleared with 20cc X 2 saline lavage. There was no ongoing bleeding.  Location of the bleeding is likely RLL  Procedures performed: Bronchial lavage  Specimens sent: Bacterial, AFB and fungal cultures  Condition post procedure: Stable  EBL: 0  Complications: none  Reccs: Consider IR embolization of RLL if bleeding recurs. Place rt side down if bleeding recurs.   Catherine GreathousePraveen Tanisha Lutes MD [place] [facility] Pager [phone] If no answer or after 3pm call: [phone] 05/17/2016, 3:56 AM

## 2016-05-17 NOTE — Progress Notes (Addendum)
Patient ID: Catherine Hernandez, female   DOB: 1986-02-27, 31 y.o.   MRN: [id number]         [facility]  Date of Admission:  05/13/2016    Total days of antibiotics 12        Day 3 linezolid          Principal Problem:   Endocarditis of tricuspid valve Active Problems:   HCAP (healthcare-associated pneumonia)   MRSA bacteremia   Enterococcal bacteremia   Staphylococcus aureus bacteremia   Serratia infection   Polysubstance abuse   IVDU (intravenous drug user)   Opioid use disorder, severe, dependence (HCC)   Hyponatremia   Thrombocytopenia ([place])   Cavitary lung disease   Poor dentition   Acute kidney injury ([place])   Protein-calorie malnutrition, severe (HCC)   Symptomatic anemia   Septic embolism (HCC)   Tobacco abuse   History of pulmonary embolism   ARF (acute renal failure) ([place])   . benzonatate  200 mg Oral TID  . chlorhexidine gluconate (MEDLINE KIT)  15 mL Mouth Rinse BID  . chlorpheniramine-HYDROcodone  5 mL Oral Q12H  . linezolid (ZYVOX) IV  600 mg Intravenous Q12H  . mouth rinse  15 mL Mouth Rinse QID  . megestrol  400 mg Oral Daily  . multivitamin with minerals  1 tablet Oral Daily  . pantoprazole (PROTONIX) IV  40 mg Intravenous Q12H  . sodium chloride flush  3 mL Intravenous Q12H   Review of Systems: Review of Systems  Unable to perform ROS: Intubated    Past Medical History:  Diagnosis Date  . Anemia   . CAP (community acquired pneumonia) 11/16/2015  . Childhood asthma   . Endocarditis   . Enterococcal bacteremia   . Heroin abuse   . Hyponatremia   . Pneumonia 2007  . Polysubstance abuse     Social History  Substance Use Topics  . Smoking status: Current Every Day Smoker    Packs/day: 0.50    Years: 10.00    Types: Cigarettes  . Smokeless tobacco: Never Used  . Alcohol use No    Family History  Problem Relation Age of Onset  . Anesthesia problems Neg Hx   . Hypotension Neg Hx   . Malignant hyperthermia Neg  Hx   . Pseudochol deficiency Neg Hx    No Known Allergies  OBJECTIVE: Vitals:   05/17/16 0720 05/17/16 0730 05/17/16 0754 05/17/16 0800  BP: (!) 82/52 (!) 78/52  101/67  Pulse: (!) 103 (!) 103  90  Resp: (!) 24 (!) 24  (!) 24  Temp:   98.6 F (37 C)   TempSrc:   Oral   SpO2: 99% 98%  100%  Weight:      Height:       Body mass index is 16.69 kg/m.  Physical Exam  Constitutional:  Now intubated and sedated after having massive hemoptysis overnight.  Cardiovascular: Regular rhythm.   No murmur heard. She remains tachycardic.  Pulmonary/Chest: Effort normal.  Coarse rhonchi.    Lab Results Lab Results  Component Value Date   WBC 19.8 (H) 05/17/2016   HGB 8.7 (L) 05/17/2016   HCT 27.3 (L) 05/17/2016   MCV 87.5 05/17/2016   PLT 222 05/17/2016    Lab Results  Component Value Date   CREATININE 1.25 (H) 05/17/2016   BUN 27 (H) 05/17/2016   NA 133 (L) 05/17/2016   K 5.5 (H) 05/17/2016   CL 108 05/17/2016   CO2 20 (  L) 05/17/2016    Lab Results  Component Value Date   ALT 9 (L) 05/17/2016   AST 23 05/17/2016   ALKPHOS 112 05/17/2016   BILITOT 1.1 05/17/2016     Microbiology: Recent Results (from the past 240 hour(s))  Acid Fast Smear (AFB)     Status: None   Collection Time: 05/07/16  1:20 PM  Result Value Ref Range Status   AFB Specimen Processing Concentration  Final   Acid Fast Smear Negative  Final    Comment: (NOTE) Performed At: [facility] [address] [place], [place] [id number] Lindon Romp MD SE:[id number]    Source (AFB) SPUTUM  Final  Culture, sputum-assessment     Status: None   Collection Time: 05/07/16  1:22 PM  Result Value Ref Range Status   Specimen Description SPUTUM  Final   Special Requests Immunocompromised  Final   Sputum evaluation THIS SPECIMEN IS ACCEPTABLE FOR SPUTUM CULTURE  Final   Report Status 05/07/2016 FINAL  Final  Culture, respiratory (NON-Expectorated)     Status: None   Collection Time: 05/07/16   1:22 PM  Result Value Ref Range Status   Specimen Description SPUTUM  Final   Special Requests Immunocompromised Reflexed from [id number]  Final   Gram Stain   Final    ABUNDANT WBC PRESENT, PREDOMINANTLY PMN MODERATE YEAST MODERATE GRAM POSITIVE RODS Performed at [facility], [place] [address], [place], [place] [address]    Culture MODERATE STAPHYLOCOCCUS AUREUS  Final   Report Status 05/10/2016 FINAL  Final   Organism ID, Bacteria STAPHYLOCOCCUS AUREUS  Final      Susceptibility   Staphylococcus aureus - MIC*    CIPROFLOXACIN <=0.5 SENSITIVE Sensitive     ERYTHROMYCIN >=8 RESISTANT Resistant     GENTAMICIN <=0.5 SENSITIVE Sensitive     OXACILLIN 0.5 SENSITIVE Sensitive     TETRACYCLINE >=16 RESISTANT Resistant     VANCOMYCIN 1 SENSITIVE Sensitive     TRIMETH/SULFA <=10 SENSITIVE Sensitive     CLINDAMYCIN >=8 RESISTANT Resistant     RIFAMPIN <=0.5 SENSITIVE Sensitive     Inducible Clindamycin NEGATIVE Sensitive     * MODERATE STAPHYLOCOCCUS AUREUS  Culture, blood (Routine X 2) w Reflex to ID Panel     Status: Abnormal   Collection Time: 05/08/16  6:25 AM  Result Value Ref Range Status   Specimen Description BLOOD LEFT ARM  Final   Special Requests IN PEDIATRIC BOTTLE 2CC  Final   Culture  Setup Time   Final    GRAM POSITIVE COCCI IN CLUSTERS AEROBIC BOTTLE ONLY CRITICAL VALUE NOTED.  VALUE IS CONSISTENT WITH PREVIOUSLY REPORTED AND CALLED VALUE.    Culture (A)  Final    STAPHYLOCOCCUS AUREUS SUSCEPTIBILITIES PERFORMED ON PREVIOUS CULTURE WITHIN THE LAST 5 DAYS. Performed at [facility], [place] [address], [place], [place] [address]    Report Status 05/11/2016 FINAL  Final  Culture, blood (routine x 2)     Status: Abnormal   Collection Time: 05/08/16  5:59 PM  Result Value Ref Range Status   Specimen Description BLOOD LEFT HAND  Final   Special Requests IN PEDIATRIC BOTTLE 1 CC  Final   Culture  Setup Time   Final    GRAM POSITIVE COCCI IN CLUSTERS IN PEDIATRIC  BOTTLE CRITICAL RESULT CALLED TO, READ BACK BY AND VERIFIED WITH: PHARMD J GADHIA [id number] MLM    Culture (A)  Final    STAPHYLOCOCCUS AUREUS SUSCEPTIBILITIES PERFORMED ON PREVIOUS CULTURE  WITHIN THE LAST 5 DAYS. Performed at [facility], [place] [address] [place], [place] [address]    Report Status 05/11/2016 FINAL  Final  Culture, blood (routine x 2)     Status: Abnormal   Collection Time: 05/08/16  5:59 PM  Result Value Ref Range Status   Specimen Description BLOOD LEFT ARM  Final   Special Requests IN PEDIATRIC BOTTLE 1.5CC  Final   Culture  Setup Time   Final    GRAM POSITIVE COCCI IN CLUSTERS AEROBIC BOTTLE ONLY CRITICAL VALUE NOTED.  VALUE IS CONSISTENT WITH PREVIOUSLY REPORTED AND CALLED VALUE.    Culture (A)  Final    STAPHYLOCOCCUS AUREUS SUSCEPTIBILITIES PERFORMED ON PREVIOUS CULTURE WITHIN THE LAST 5 DAYS. Performed at [facility], [place] [address] [place], [place] [address]    Report Status 05/11/2016 FINAL  Final  Culture, blood (Routine X 2) w Reflex to ID Panel     Status: Abnormal   Collection Time: 05/08/16  9:49 PM  Result Value Ref Range Status   Specimen Description BLOOD LEFT HAND  Final   Special Requests IN PEDIATRIC BOTTLE 0.5CC  Final   Culture  Setup Time   Final    GRAM POSITIVE COCCI IN CLUSTERS IN PEDIATRIC BOTTLE CRITICAL RESULT CALLED TO, READ BACK BY AND VERIFIED WITH: Rigby _0  002/05/18 MKELLY,MLT Performed at [facility], [place] [address], [place], [place] [address]    Culture METHICILLIN RESISTANT STAPHYLOCOCCUS AUREUS (A)  Final   Report Status 05/11/2016 FINAL  Final   Organism ID, Bacteria METHICILLIN RESISTANT STAPHYLOCOCCUS AUREUS  Final      Susceptibility   Methicillin resistant staphylococcus aureus - MIC*    CIPROFLOXACIN <=0.5 SENSITIVE Sensitive     ERYTHROMYCIN >=8 RESISTANT Resistant     GENTAMICIN <=0.5 SENSITIVE Sensitive     OXACILLIN >=4 RESISTANT Resistant     TETRACYCLINE >=16  RESISTANT Resistant     VANCOMYCIN <=0.5 SENSITIVE Sensitive     TRIMETH/SULFA <=10 SENSITIVE Sensitive     CLINDAMYCIN >=8 RESISTANT Resistant     RIFAMPIN <=0.5 SENSITIVE Sensitive     Inducible Clindamycin NEGATIVE Sensitive     * METHICILLIN RESISTANT STAPHYLOCOCCUS AUREUS  Acid Fast Smear (AFB)     Status: None   Collection Time: 05/10/16  6:09 AM  Result Value Ref Range Status   AFB Specimen Processing Concentration  Final   Acid Fast Smear Negative  Final    Comment: (NOTE) Performed At: [facility] [address] [place], [place] [id number] Lindon Romp MD XT:[id number]    Source (AFB) SPUTUM  Final  Culture, blood (routine x 2)     Status: Abnormal   Collection Time: 05/10/16 11:18 AM  Result Value Ref Range Status   Specimen Description BLOOD RIGHT ARM  Final   Special Requests IN PEDIATRIC BOTTLE 3CC  Final   Culture  Setup Time   Final    GRAM POSITIVE COCCI IN CLUSTERS IN PEDIATRIC BOTTLE CRITICAL VALUE NOTED.  VALUE IS CONSISTENT WITH PREVIOUSLY REPORTED AND CALLED VALUE.    Culture (A)  Final    STAPHYLOCOCCUS AUREUS SUSCEPTIBILITIES PERFORMED ON PREVIOUS CULTURE WITHIN THE LAST 5 DAYS. Performed at [facility], [place] [address], [place], [place] [address]    Report Status 05/12/2016 FINAL  Final  Culture, blood (routine x 2)     Status: None (Preliminary result)   Collection Time: 05/13/16 10:46 AM  Result Value Ref Range Status  Specimen Description BLOOD LEFT ARM  Final   Special Requests IN PEDIATRIC BOTTLE 1CC  Final   Culture   Final    NO GROWTH 3 DAYS Performed at [facility], [place] [address], [place], [place] [address]    Report Status PENDING  Incomplete  Culture, blood (routine x 2)     Status: None (Preliminary result)   Collection Time: 05/13/16 10:49 AM  Result Value Ref Range Status   Specimen Description BLOOD RIGHT HAND  Final   Special Requests IN PEDIATRIC BOTTLE 3CC  Final   Culture   Final    NO GROWTH 3  DAYS Performed at [facility], [place] [address], [place], [place] [address]    Report Status PENDING  Incomplete    ASSESSMENT: She has severe tricuspid valve endocarditis. Over the past 6 months she has been bacteremic with MSSA, Serratia and now MRSA. She has had severe recurrent septic pulmonary emboli leading to cavitation and now hemoptysis.  PLAN: 1. Continue linezolid 2. Await results of sputum culture  Michel Bickers, MD [facility] for [place] [phone] pager   [phone] cell 05/17/2016, 9:54 AM

## 2016-05-17 NOTE — Procedures (Signed)
Central Venous Catheter Insertion Procedure Note Catherine Hernandez [id number] June 17, 1985  Procedure: Insertion of Central Venous Catheter Indications: Assessment of intravascular volume, Drug and/or fluid administration and Frequent blood sampling  Procedure Details Consent: Risks of procedure as well as the alternatives and risks of each were explained to the (patient/caregiver).  Consent for procedure obtained. Time Out: Verified patient identification, verified procedure, site/side was marked, verified correct patient position, special equipment/implants available, medications/allergies/relevent history reviewed, required imaging and test results available.  Performed  Maximum sterile technique was used including antiseptics, cap, gloves, gown, hand hygiene, mask and sheet. Skin prep: Chlorhexidine; local anesthetic administered A antimicrobial bonded/coated triple lumen catheter was placed in the right internal jugular vein using the Seldinger technique.  Evaluation Blood flow good Complications: No apparent complications Patient did tolerate procedure well. Chest X-ray ordered to verify placement.  CXR: pending.  Catherine Hernandez, AG-ACNP [facility]  Pgr: [phone]  PCCM Pgr: [phone]  Catherine Hernandez, M.D. [facility]. Pager: [phone]. After hours pager: [phone].

## 2016-05-17 NOTE — Progress Notes (Signed)
Assist with bedside bronchoscopy. VSS throughout procedure. Ventilator settings and alarms titrated pre and post procedure.

## 2016-05-17 NOTE — Progress Notes (Signed)
CRITICAL VALUE ALERT  Critical value received: troponin 0.03  Date of notification:  2/14  Time of notification:  0450  Critical value read back:yes  Nurse who received alert: Luisa HartPatrick RN  MD notified (1st page): Elink  Time of first page:  [phone]

## 2016-05-17 NOTE — Progress Notes (Signed)
[facility] Attending Note  Presenting HPI:  31 y.o. 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 Patient found to have septic emboli on CT angiogram in December and was started on Xarelto for systemic anticoagulation. No known history of DVT on venous duplex. 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  IMAGING/STUDIES: CTA CHEST 03/22/16:  Per radiologist. Bilateral lower lobe pulmonary emboli with taper. Since which can be seen with chronic pulmonary emboli or possible vasculopathy secondary to septic emboli. Multiple cavitating and non-cavitating pneumonia/consolidation of varying ages consistent with septic emboli. Trace pleural effusions. Mild cardiomegaly with dilated right atrium. Suspect splenomegaly.  VENOUS DUPLEX BILATERAL LOWER EXTREMITIES 03/23/16: No evidence of DVT or SVT involving right or left lower extremity.  CT CHEST W/O 05/31/2016:  Previously reviewed by me. No pleural effusion or thickening. No pericardial effusion. Multiple cavitary nodules and nodular opacities throughout all lung zones. Precarinal lymphadenopathy with significant enlargement up to 1.5 cm in short axis by my review.  TTE 05/08/16:  LV normal in size with EF 60-65% & no regional wall motion abnormalities. LA normal in size. RA severely dilated. RV moderately dilated with preserved systolic function. No aortic stenosis or regurgitation. Aortic root normal in size. No mitral stenosis or regurgitation. No pulmonic stenosis or regurgitation. Severe tricuspid regurgitation. No pericardial effusion.  Significant events  2/14   At approximately 2am pt expeirenced coughing spell which progressed into massive hemoptysis requiring emergent intubation by  anesthesia. bronch >> RLL bleeding  SUBJ -  mod distress, anxious, frank blood via ETT  Afebrile Decreased UO  Temp:  [97.7 F (36.5 C)-99.9 F (37.7 C)] 98.6 F (37 C) (02/14 0754) Pulse Rate:  [90-147] 90 (02/14 0800) Resp:  [18-28] 24 (02/14 0800) BP: (78-148)/(52-87) 101/67 (02/14 0800) SpO2:  [97 %-100 %] 100 % (02/14 0800) FiO2 (%):  [40 %-100 %] 80 % (02/14 0818) Weight:  [100 lb 5 oz (45.5 kg)] 100 lb 5 oz (45.5 kg) (02/14 0300)   General:  Young cachectic female agitated on vent Neuro:  Agitated, uncomfortable in bed. Not following commands HEENT:  Bloody secretions in ETT Cardiovascular:  Tachy, regular, no MRG Lungs:  Coarse bilateral breath sounds Abdomen:  Soft, non-distended Musculoskeletal:  No acute deformity or ROM limitation Skin:  Several punctate scabs throughout extremities     CBC Latest Ref Rng & Units 05/17/2016 05/17/2016 05/16/2016  WBC 4.0 - 10.5 K/uL 19.8(H) 23.6(H) 8.8  Hemoglobin 12.0 - 15.0 g/dL 1.6(X) 0.9(U) 0.4(V)  Hematocrit 36.0 - 46.0 % 27.3(L) 28.4(L) 25.3(L)  Platelets 150 - 400 K/uL 222 253 189    BMP Latest Ref Rng & Units 05/17/2016 05/16/2016 05/15/2016  Glucose 65 - 99 mg/dL 409(W) 119(J) 478(G)  BUN 6 - 20 mg/dL 95(A) 21(H) 16  Creatinine 0.44 - 1.00 mg/dL 0.86(V) 7.84(O) 9.62(X)  Sodium 135 - 145 mmol/L 133(L) 132(L) 134(L)  Potassium 3.5 - 5.1 mmol/L 5.5(H) 4.5 4.3  Chloride 101 - 111 mmol/L 108 107 111  CO2 22 - 32 mmol/L 20(L) 18(L) 16(L)  Calcium 8.9 - 10.3 mg/dL 7.9(L) 8.0(L) 8.0(L)      MICROBIOLOGY: HIV 2/3:  Nonreactive Blood Cultures x2 2/3:  1/2 Bottles MRSA Urine Culture 2/3:  <10,000 Colonies insignificant growth Sputum Culture 2/4:  Moderate MSSA Sputum AFB Smear/Culture 2/4 >>>  Smear Negative / Culture pending Blood Culture x4 2/5:  4/4 Positive MRSA Sputum AFB Smear/Culture 2/7 >>> Smear Negative / Culture pending Blood Culture x1 2/7:  Staphylococcus aureus Influenza A/B PCR 2/8:  Negative    ANTIBIOTICS: Cefepime 2/3 (x1 dose) Vancomycin 2/3 - 2/8 Daptomycin 2/8 - 2/12 Zyvox 2/12 >>>  ASSESSMENT/PLAN:  31 y.o. female with history of IV drug use presenting with MRSA bacteremia going back to August 2017. Patient does have a history of tricuspid valve vegetations which are no longer present and likely the cause for the previously noted septic emboli. Course now complicated by massive hemoptysis 2/14 after coughing episode, for which she required intubation, xarelto stopped 2/12  PULMONARY A: Acute hypoxemic respiratory failure Hemoptysis >> bronch shows RLL source Pulmonary emboli  P:   Full vent support VAP bundle Drop FIO2 as satn permits If massive to the point of hypoxia, may need bronchial artery embolisation    CARDIOVASCULAR A:  Hypotension - due to meds & vent TV endocarditis with severe TR  P:  Telemetry monitoring Volume, Low dose neo gtt ok    RENAL A:   AKI Hyperkalemia Metabolic acidosis - resp component corrected  P:   Volume resuscitation Follow BMP and UOP Holding further NSAID  GASTROINTESTINAL A:   UGIB  P:   Protonix IV 40 q 12 NPO Initiate TF in 24h   HEMATOLOGIC A:   ABLA on anemia of critical illness. Pulmonary embolism  P:  Follow CBC Xarelto stopped 2/12 DC anticoagulation/VTE ppx  INFECTIOUS A:   Serratia bacteremia secondary to IVDA TV endocarditis MRSA bacteremia, MSSA multifocal cavitary pneumonia.  Leucocytosis  P:   ABX and cultures as above Poor surgical candidate ID following  ENDOCRINE A:   Hypoglycmia  P:   Add D10  NEUROLOGIC A:   Acute metabolic encephalopahty Polysubstance abuse P:   RASS goal: -1 to -2 Versed infusion for sedation due to hypotension Fentanyl infusion for analgesia  Global: not a candidate for SNF placement as she has left SNF AMA. Palliative re-consulted 2/13.   FAMILY  - Updates: attempted to contact family with no repsonse  - Inter-disciplinary family  meet or Palliative Care meeting due by:  Palliative consulted.    My Cc time x 820m  Cyril Mourningakesh Alva MD. FCCP.  Pulmonary & Critical care Pager [phone] If no response call [phone]     05/17/2016 9:13 AM

## 2016-05-17 NOTE — Progress Notes (Signed)
Shift event note:  Notified by RN regarding pt found w/ significantly increased WOB after a violent coughing episode that included mod amount hemoptysis. 02 sats dropped briefly while coughing into the 80's but w/ rapid return to baseline on NRB. RR RN was paged and responded to bedside. RR RN noted pt c/o CP upon his arrival to bedside.  Stat PCXR ordered. At bedside pt noted in obvious acute respiratory distress. RR 40-50's, HR in the 140-150's. 02 sats in the low to mid 90's on NRB. Pt is able to answer questions but becoming increasingly lethargic w/ "guppy" type respirations. Explained to pt urgent need to intubate and she is agreeable. Pt was prepared for stat transfer to ICU. Anesthesia on stand-by in ICU. Upon arrival to room pt successfully intubated by anesthesia MD. (See note). Remains tachycardic (140's) w/ SBP in the 90's. OG and foley placed. ABG, CBC, CMET. Lactate, Troponin, EKG, CXR pending. Assessment/Plan: 1. Acute hypoxic respiratory failure: In clinical setting of pt w/ pulmonary emboli (likely septic from tricuspid valve vegetations) and multifocal/cavitary PNA. Infectious disease following. Continue IV Zyvox per ID. Vent management per PCCM. Follow CXR and labs.  2. Hemoptysis: Unclear if oral, GI or respiratory source. Pt initially told RN she bit her tongue but RR RT unable to find a source of oral bleeding. Has been off Eliquis since admission and was not compliant w/ it prior to admission. OG to low intermittent suction noted w/ dark red bloody drainage. Updated P. Mikey BussingHoffman, NP w/ PCCM at bedside. Attempted to call boyfriend at pt's request but went to voicemail. Telephone number in chart for "Aunt" incorrect. Appreciate PCCM input and management. Will continue to follow closely in ICU.  Catherine ChangKatherine P. Scotlynn Noyes, NP-C [facility] Pager [phone]

## 2016-05-17 NOTE — Progress Notes (Signed)
eLink Physician-Brief Progress Note Patient Name: Catherine Hernandez DOB: 12/03/1985 MRN: [id number]   Date of Service  05/17/2016  HPI/Events of Note  Multiple issues: 1. K+ = 5.5 >> 6.0 and 2. Hgb = 7.4.  eICU Interventions  Will order: 1. Kayexalate 30 gm via tube now.  2. BMP and CBC at 11 PM.     Intervention Category Major Interventions: Electrolyte abnormality - evaluation and management;Other:  Lenell AntuSommer,Ryen Rhames Eugene 05/17/2016, 4:52 PM

## 2016-05-17 NOTE — Progress Notes (Signed)
[facility] Attending Note  Presenting HPI:  31 y.o. 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 Patient found to have what appeared to be septic emboli on CT angiogram in December and was started on Xarelto for systemic anticoagulation. No known history of DVT on venous duplex. Patient also has evidence of septic emboli with cavitation. 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  Interval:   At approximately 2am pt expeirenced coughing spell which progressed into massive hemoptysis requiring emergent intubation by anesthesia.  Review of Systems:  Unable as patient in intubated and encephalopathic  Temp:  [97.7 F (36.5 C)-99.9 F (37.7 C)] 99.9 F (37.7 C) (02/13 1603) Pulse Rate:  [100-114] 110 (02/13 2250) Resp:  [18-24] 24 (02/13 2250) BP: (115-123)/(72-89) 115/77 (02/13 2250) SpO2:  [97 %-98 %] 97 % (02/13 2250) FiO2 (%):  [100 %] 100 % (02/14 0147)   General:  Young cachectic female agitated on vent Neuro:  Agitated, uncomfortable in bed. Not following commands HEENT:  Bloody secretions in ETT Cardiovascular:  Tachy, regular, no MRG Lungs:  Coarse bilateral breath sounds Abdomen:  Soft, non-distended Musculoskeletal:  No acute deformity or ROM limitation Skin:  Several punctate scabs throughout extremities     CBC Latest Ref Rng & Units 05/17/2016 05/16/2016 05/15/2016  WBC 4.0 - 10.5 K/uL 23.6(H) 8.8 8.6  Hemoglobin 12.0 - 15.0 g/dL 9.0(L) 8.2(L) 7.6(L)  Hematocrit 36.0 - 46.0 % 28.4(L) 25.3(L) 24.2(L)  Platelets 150 - 400 K/uL 253 189 180    BMP Latest Ref Rng & Units 05/16/2016 05/15/2016 05/13/2016  Glucose 65 - 99 mg/dL 161(W102(H) 960(A125(H) 540(J107(H)  BUN 6 - 20 mg/dL 81(X24(H) 16 91(Y21(H)  Creatinine 0.44 - 1.00 mg/dL 7.82(N1.22(H)  5.62(Z1.05(H) 3.08(M1.23(H)  Sodium 135 - 145 mmol/L 132(L) 134(L) 134(L)  Potassium 3.5 - 5.1 mmol/L 4.5 4.3 4.3  Chloride 101 - 111 mmol/L 107 111 110  CO2 22 - 32 mmol/L 18(L) 16(L) 17(L)  Calcium 8.9 - 10.3 mg/dL 8.0(L) 8.0(L) 7.6(L)    IMAGING/STUDIES: CTA CHEST 03/22/16:  Per radiologist. Bilateral lower lobe pulmonary emboli with taper. Since which can be seen with chronic pulmonary emboli or possible vasculopathy secondary to septic emboli. Multiple cavitating and non-cavitating pneumonia/consolidation of varying ages consistent with septic emboli. Trace pleural effusions. Mild cardiomegaly with dilated right atrium. Suspect splenomegaly.  VENOUS DUPLEX BILATERAL LOWER EXTREMITIES 03/23/16: No evidence of DVT or SVT involving right or left lower extremity.  CT CHEST W/O 05/19/2016:  Previously reviewed by me. No pleural effusion or thickening. No pericardial effusion. Multiple cavitary nodules and nodular opacities throughout all lung zones. Precarinal lymphadenopathy with significant enlargement up to 1.5 cm in short axis by my review.  TTE 05/08/16:  LV normal in size with EF 60-65% & no regional wall motion abnormalities. LA normal in size. RA severely dilated. RV moderately dilated with preserved systolic function. No aortic stenosis or regurgitation. Aortic root normal in size. No mitral stenosis or regurgitation. No pulmonic stenosis or regurgitation. Severe tricuspid regurgitation. No pericardial effusion.  MICROBIOLOGY: HIV 2/3:  Nonreactive Blood Cultures x2 2/3:  1/2 Bottles MRSA Urine Culture 2/3:  <10,000 Colonies insignificant growth Sputum Culture 2/4:  Moderate MSSA Sputum AFB Smear/Culture 2/4 >>>  Smear Negative / Culture pending Blood Culture x4 2/5:  4/4 Positive MRSA Sputum AFB Smear/Culture  2/7 >>> Smear Negative / Culture pending Blood Culture x1 2/7:  Staphylococcus aureus Influenza A/B PCR 2/8:  Negative   ANTIBIOTICS: Cefepime 2/3 (x1 dose) Vancomycin 2/3 -  2/8 Daptomycin 2/8 - 2/12 Zyvox 2/12 >>>  ASSESSMENT/PLAN:  31 y.o. female with history of IV drug use presenting with MRSA bacteremia going back to August 2017. Patient does have a history of tricuspid valve vegetations which are no longer present and likely the cause for the previously noted septic emboli. Course now complicated by massive hemoptysis after coughing episode, for which she required intubation.   PULMONARY A: Acute hypoxemic respiratory failure Hemoptysis Pulmonary emboli  P:   Full vent support Follow ABG Repeat CXR VAP bundle May benefit from FOB  CARDIOVASCULAR A:  Hypotension - concern hemorrhagic shock TV endocarditis with severe TR  P:  Telemetry monitoring Volume resuscitation May need blood products although now H&H stable May need pressors Assess lactic acid, troponin Low dose peripheral neo  RENAL A:   AKI Hyperkalemia  P:   Volume resuscitation Will give IVF bolus for K Follow BMP and UOP Holding further NSAID  GASTROINTESTINAL A:   Possible GI source of hemorrhage  P:   If FOB negative will need GI consultation Protonix IV NPO Initiate TF in AM  HEMATOLOGIC A:   ABLA on anemia of critical illness. Pulmonary embolism  P:  Follow CBC Xarelto stopped 2/12 DC anticoagulation/VTE ppx  INFECTIOUS A:   Serratia bacteremia secondary to IVDA TV endocarditis MRSA bacteremia, MSSA multifocal cavitary pneumonia.  Marked WBC elevation 2/14 early AM  P:   ABX and cultures as above Poor surgical candidate ID following  ENDOCRINE A:   No acute issues  P:   Follow glucose on chemistry  NEUROLOGIC A:   Acute metabolic encephalopahty Polysubstance abuse P:   RASS goal: -1 to -2 Versed infusion for sedation due to hypotension Fentanyl infusion for analgesia  Global: not a candidate for SNF placement as she has left SNF AMA. Palliative re-consulted 2/13.   FAMILY  - Updates: attempted to contact family with no  repsonse  - Inter-disciplinary family meet or Palliative Care meeting due by:  Palliative consulted. This has been ongoing.   Joneen Roach, AGACNP-BC [facility]Critical [facility] Pager [phone] or [phone]  05/17/2016 3:13 AM

## 2016-05-17 NOTE — Progress Notes (Signed)
eLink Physician-Brief Progress Note Patient Name: Catherine Hernandez DOB: 01/28/1986 MRN: [id number]   Date of Service  05/17/2016  HPI/Events of Note  31 y.o. Female w/ Tricuspid Valve Endocarditis & Acute PE on Xarelto>>anticoag stopped. Switched from Dapto to Zyvox 2/12 w/ Staph auerus cavitary pneumonia. Palliative Medicine following. Rapid decline in resp status-worsening hypoxia, increased WOB and obtundation, Anesthesia intubated.     eICU Interventions  Admit to ICU Vent support, sedation labs     Intervention Category Evaluation Type: New Patient Evaluation  [name] 05/17/2016, 2:00 AM

## 2016-05-17 NOTE — Progress Notes (Signed)
Urine output decreased to 5mL over the last 2 hours, 6215mL/hr the 2 hours prior.  Notified Md.  Telephone order placed for 500mL bolus Normal Saline.

## 2016-05-18 ENCOUNTER — Inpatient Hospital Stay (HOSPITAL_COMMUNITY): Payer: Medicaid Other

## 2016-05-18 LAB — BASIC METABOLIC PANEL
ANION GAP: 8 (ref 5–15)
Anion gap: 6 (ref 5–15)
Anion gap: 6 (ref 5–15)
BUN: 38 mg/dL — AB (ref 6–20)
BUN: 40 mg/dL — ABNORMAL HIGH (ref 6–20)
BUN: 43 mg/dL — ABNORMAL HIGH (ref 6–20)
CALCIUM: 7.9 mg/dL — AB (ref 8.9–10.3)
CALCIUM: 7.6 mg/dL — AB (ref 8.9–10.3)
CHLORIDE: 110 mmol/L (ref 101–111)
CO2: 18 mmol/L — AB (ref 22–32)
CO2: 18 mmol/L — AB (ref 22–32)
CO2: 18 mmol/L — ABNORMAL LOW (ref 22–32)
CREATININE: 2.22 mg/dL — AB (ref 0.44–1.00)
Calcium: 8 mg/dL — ABNORMAL LOW (ref 8.9–10.3)
Chloride: 110 mmol/L (ref 101–111)
Chloride: 112 mmol/L — ABNORMAL HIGH (ref 101–111)
Creatinine, Ser: 1.77 mg/dL — ABNORMAL HIGH (ref 0.44–1.00)
Creatinine, Ser: 2.05 mg/dL — ABNORMAL HIGH (ref 0.44–1.00)
GFR calc Af Amer: 44 mL/min — ABNORMAL LOW (ref >60)
GFR calc Af Amer: 37 mL/min — ABNORMAL LOW (ref >60)
GFR calc non Af Amer: 38 mL/min — ABNORMAL LOW (ref >60)
GFR calc non Af Amer: 32 mL/min — ABNORMAL LOW (ref >60)
GFR calc non Af Amer: 29 mL/min — ABNORMAL LOW (ref >60)
GFR, EST AFRICAN AMERICAN: 34 mL/min — AB (ref >60)
GLUCOSE: 91 mg/dL (ref 65–99)
GLUCOSE: 90 mg/dL (ref 65–99)
Glucose, Bld: 98 mg/dL (ref 65–99)
POTASSIUM: 5.3 mmol/L — AB (ref 3.5–5.1)
Potassium: 5.1 mmol/L (ref 3.5–5.1)
Potassium: 5.3 mmol/L — ABNORMAL HIGH (ref 3.5–5.1)
SODIUM: 136 mmol/L (ref 135–145)
Sodium: 134 mmol/L — ABNORMAL LOW (ref 135–145)
Sodium: 136 mmol/L (ref 135–145)

## 2016-05-18 LAB — CULTURE, BLOOD (ROUTINE X 2)
Culture: NO GROWTH
Culture: NO GROWTH

## 2016-05-18 LAB — CBC
HEMATOCRIT: 23.7 % — AB (ref 36.0–46.0)
Hemoglobin: 7.3 g/dL — ABNORMAL LOW (ref 12.0–15.0)
MCH: 27.4 pg (ref 26.0–34.0)
MCHC: 30.8 g/dL (ref 30.0–36.0)
MCV: 89.1 fL (ref 78.0–100.0)
Platelets: 181 10*3/uL (ref 150–400)
RBC: 2.66 MIL/uL — ABNORMAL LOW (ref 3.87–5.11)
RDW: 18.8 % — AB (ref 11.5–15.5)
WBC: 10.1 10*3/uL (ref 4.0–10.5)

## 2016-05-18 LAB — GLUCOSE, CAPILLARY
GLUCOSE-CAPILLARY: 88 mg/dL (ref 65–99)
GLUCOSE-CAPILLARY: 81 mg/dL (ref 65–99)
GLUCOSE-CAPILLARY: 77 mg/dL (ref 65–99)
Glucose-Capillary: 95 mg/dL (ref 65–99)
Glucose-Capillary: 90 mg/dL (ref 65–99)

## 2016-05-18 LAB — CREATININE, URINE, RANDOM: CREATININE, URINE: 160.15 mg/dL

## 2016-05-18 LAB — SODIUM, URINE, RANDOM: SODIUM UR: 18 mmol/L

## 2016-05-18 LAB — MAGNESIUM: Magnesium: 1.8 mg/dL (ref 1.7–2.4)

## 2016-05-18 LAB — PHOSPHORUS: Phosphorus: 9.2 mg/dL — ABNORMAL HIGH (ref 2.5–4.6)

## 2016-05-18 MED ORDER — JEVITY 1.2 CAL PO LIQD
1000.0000 mL | ORAL | Status: DC
  Administered 2016-05-18: 13:00:00

## 2016-05-18 MED ORDER — PANTOPRAZOLE SODIUM 40 MG PO PACK
40.0000 mg | PACK | Freq: Every day | ORAL | Status: DC
  Administered 2016-05-20 – 2016-05-24 (×5): 40 mg
  Filled 2016-05-18 (×5): qty 20

## 2016-05-18 MED ORDER — PRO-STAT SUGAR FREE PO LIQD
30.0000 mL | Freq: Every day | ORAL | Status: DC
  Administered 2016-05-18: 30 mL
  Filled 2016-05-18: qty 30

## 2016-05-18 MED ORDER — SODIUM CHLORIDE 0.9 % IV BOLUS (SEPSIS)
500.0000 mL | Freq: Once | INTRAVENOUS | Status: AC
  Administered 2016-05-18: 500 mL via INTRAVENOUS

## 2016-05-18 MED ORDER — SODIUM CHLORIDE 0.9 % IV SOLN
INTRAVENOUS | Status: DC
  Administered 2016-05-18 – 2016-05-22 (×5): via INTRAVENOUS

## 2016-05-18 MED ORDER — FUROSEMIDE 10 MG/ML IJ SOLN
80.0000 mg | Freq: Once | INTRAMUSCULAR | Status: AC
  Administered 2016-05-18: 80 mg via INTRAVENOUS
  Filled 2016-05-18: qty 8

## 2016-05-18 MED ORDER — CHLORHEXIDINE GLUCONATE CLOTH 2 % EX PADS
6.0000 | MEDICATED_PAD | Freq: Every day | CUTANEOUS | Status: DC
  Administered 2016-05-19 – 2016-05-24 (×6): 6 via TOPICAL

## 2016-05-18 NOTE — Progress Notes (Signed)
[facility] Attending Note  Presenting HPI:  31 y.o. 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 Patient found to have septic emboli on CT angiogram in December and was started on Xarelto for systemic anticoagulation. No known history of DVT on venous duplex. 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 Over last 6 mnths, she has been bacteremic with MSSA, serratia & MRSA this time  Lines/ Tubes  ETT 2/14 >> RIJ 2/14 >>  IMAGING/STUDIES: CTA CHEST 03/22/16:  Per radiologist. Bilateral lower lobe pulmonary emboli with taper. Since which can be seen with chronic pulmonary emboli or possible vasculopathy secondary to septic emboli. Multiple cavitating and non-cavitating pneumonia/consolidation of varying ages consistent with septic emboli. Trace pleural effusions. Mild cardiomegaly with dilated right atrium. Suspect splenomegaly.  VENOUS DUPLEX BILATERAL LOWER EXTREMITIES 03/23/16: No evidence of DVT or SVT involving right or left lower extremity.  CT CHEST W/O 2016-05-29:  Previously reviewed by me. No pleural effusion or thickening. No pericardial effusion. Multiple cavitary nodules and nodular opacities throughout all lung zones. Precarinal lymphadenopathy with significant enlargement up to 1.5 cm in short axis by my review.  TTE 05/08/16:  LV normal in size with EF 60-65% & no regional wall motion abnormalities. LA normal in size. RA severely dilated. RV moderately dilated with preserved systolic function. No aortic stenosis or regurgitation. Aortic root normal in size. No mitral stenosis or regurgitation. No pulmonic stenosis or regurgitation. Severe tricuspid regurgitation. No pericardial effusion.  Significant events  2/14   At  approximately 2am pt expeirenced coughing spell which progressed into massive hemoptysis requiring emergent intubation by anesthesia. bronch >> RLL bleeding  SUBJ -   Afebrile Decreased UO Agitation requiring versed gtt & boluses in addn to fent gtt  Temp:  [97.6 F (36.4 C)-99.2 F (37.3 C)] 97.6 F (36.4 C) (02/15 0751) Pulse Rate:  [104-120] 112 (02/15 0800) Resp:  [19-26] 24 (02/15 0800) BP: (87-116)/(49-70) 103/53 (02/15 0800) SpO2:  [88 %-100 %] 99 % (02/15 0800) FiO2 (%):  [50 %-70 %] 50 % (02/15 0735) Weight:  [104 lb 11.5 oz (47.5 kg)] 104 lb 11.5 oz (47.5 kg) (02/15 0500)   General:  Young cachectic female agitated on vent Neuro:  Agitated break thru, RASS -1 Not following commands HEENT:  Bloody secretions in ETT, decreased from 2/14 Cardiovascular:  Tachy, regular, no MRG Lungs:  Coarse bilateral breath sounds Abdomen:  Soft, non-distended Musculoskeletal:  No acute deformity or ROM limitation Skin:  Several punctate scabs throughout extremities     CBC Latest Ref Rng & Units 05/17/2016 05/17/2016 05/17/2016  WBC 4.0 - 10.5 K/uL 11.6(H) 10.1 19.8(H)  Hemoglobin 12.0 - 15.0 g/dL 7.9(L) 7.4(L) 8.7(L)  Hematocrit 36.0 - 46.0 % 25.4(L) 23.5(L) 27.3(L)  Platelets 150 - 400 K/uL 202 165 222    BMP Latest Ref Rng & Units 05/17/2016 05/17/2016 05/17/2016  Glucose 65 - 99 mg/dL 91 78 161(W)  BUN 6 - 20 mg/dL 96(E) 45(W) 09(W)  Creatinine 0.44 - 1.00 mg/dL 1.19(J) 4.78(G) 9.56(O)  Sodium 135 - 145 mmol/L 134(L) 134(L) 133(L)  Potassium 3.5 - 5.1 mmol/L 5.3(H) 6.0(H) 5.5(H)  Chloride 101 - 111 mmol/L 110 112(H) 108  CO2 22 - 32 mmol/L 18(L) 16(L) 20(L)  Calcium 8.9 - 10.3 mg/dL 8.0(L) 7.7(L) 7.9(L)  MICROBIOLOGY: HIV 2/3:  Nonreactive Blood Cultures x2 2/3:  1/2 Bottles MRSA Urine Culture 2/3:  <10,000 Colonies insignificant growth Sputum Culture 2/4:  Moderate MSSA Sputum AFB Smear/Culture 2/4 >>>  Smear Negative / Culture pending Blood Culture x4 2/5:  4/4  Positive MRSA Sputum AFB Smear/Culture 2/7 >>> Smear Negative / Culture pending Blood Culture x1 2/7:  Staphylococcus aureus Influenza A/B PCR 2/8:  Negative   ANTIBIOTICS: Cefepime 2/3 (x1 dose) Vancomycin 2/3 - 2/8 Daptomycin 2/8 - 2/12 Zyvox 2/12 >>>  ASSESSMENT/PLAN:  31 y.o. female with history of IV drug use presenting with MRSA bacteremia . Patient does have a history of tricuspid valve vegetations, going back to August 2017 which are no longer present and likely the cause for the previously noted septic emboli. Course now complicated by massive hemoptysis 2/14 after coughing episode, for which she required intubation, xarelto stopped 2/12  PULMONARY A: Acute hypoxemic respiratory failure Hemoptysis >> bronch shows RLL source Pulmonary emboli  P:   Full vent support VAP bundle Bleeding temporised - hopeful to avoid  bronchial artery embolisation here SBTs in 24 h if bleeding stops   CARDIOVASCULAR A:  Hypotension - due to meds (versed)  & vent TV endocarditis with severe TR  P:  Telemetry monitoring Volume, Low dose neo gtt ok   RENAL A:   AKI - likely ATN Hyperkalemia Metabolic acidosis - resp component corrected  P:  Chk FeNa  Volume resuscitation , increase if FENa<1 Kayexalate x1 given Follow BMP and UOP Holding further NSAID  GASTROINTESTINAL A:   Doubt UGIB - feel aspirated blood  P:   Protonix IV 40 q 24 NPO Initiate TF  HEMATOLOGIC A:   ABLA on anemia of critical illness. Pulmonary embolism  P:  Follow CBC Xarelto stopped 2/12 DC'd anticoagulation/VTE ppx  INFECTIOUS A:   Serratia bacteremia secondary to IVDA TV endocarditis MRSA bacteremia, MSSA multifocal cavitary pneumonia.  Leucocytosis  P:   ABX and cultures as above Poor surgical candidate ID following  ENDOCRINE A:   Hypoglycmia  P:   TFs  NEUROLOGIC A:   Acute metabolic encephalopahty Polysubstance abuse P:   RASS goal: 0 Versed infusion - try not to  bolus Fentanyl infusion for analgesia  Global: not a candidate for SNF placement as she has left SNF AMA. Palliative re-consulted 2/13.   FAMILY  - Updates: no family available  - Inter-disciplinary family meet or Palliative Care meeting due by:  consulted.    My Cc time x 6534m  Cyril Mourningakesh Joneisha Miles MD. FCCP. [facility] Pager [phone] If no response call [phone]     05/18/2016 9:32 AM

## 2016-05-18 NOTE — Progress Notes (Signed)
Nutrition Follow-up  DOCUMENTATION CODES:   Severe malnutrition in context of chronic illness, Underweight  INTERVENTION:  - Will order Jevity 1.2 @ 20 mL/hr and increase by 10 mL every 8 hours to reach goal rate of Jevity 1.2 @ 50 mL/hr. At goal rate, this regimen will provide 1440 kcal (97% re-estimated kcal need), 67 grams of protein, and 968 mL free water. - Will order 30 mL Prostat once/day until Jevity 1.2 is at goal rate; this will provide 100 kcal and 15 grams of protein.   NUTRITION DIAGNOSIS:   Malnutrition related to chronic illness as evidenced by severe depletion of body fat, severe depletion of muscle mass. -ongoing  GOAL:   Patient will meet greater than or equal to 90% of their needs -unable to meet with TF not yet started.  MONITOR:   Vent status, TF tolerance, Weight trends, Labs, I & O's  REASON FOR ASSESSMENT:   Consult Enteral/tube feeding initiation and management  ASSESSMENT:   31 y.o.femalewit71h medical history significant for IV drug abuse, endocarditis involving tricuspid valve, and nonadherence with her treatment plan, now presenting to the emergency department with generalized aches and pains, malaise, productive cough, and fatigue.   2/15 Pt continues with OGT and consult for TF initiation received. Spoke with RN about plan for TF. Weight +2 kg from yesterday; will continue to use admission weight in estimating needs. In rounds RN reported minimal drainage from OGT which is mostly bile-like but that when suctioned pt outputs large amount of bloody drainage (600cc last night per RN). Will order TF as outlined above with slow advancement d/t concern for hemodynamic stability and possible electrolyte shift if TF is initiated at goal rate. Will adjust goal rate/regimen if D5 continues to be needed once TF approaches goal rate.  Patient is currently intubated on ventilator support MV: 11.3 L/min Temp (24hrs), Avg:98.4 F (36.9 C), Min:97.6 F (36.4 C),  Max:99.2 F (37.3 C) BP:98/52 and MAP: 68  Medications reviewed; PRN Dulcolax, 15 mL liquid multivitamin/day, PRN Zofran, 40 mg Protonix per OGT/day, 30 mg Kayexalate per OGT x1 dose yesterday.  Labs reviewed; Na: 134 mmol/L, K: 5.3 mmol/L (down from 6 mmol/L yesterday), BUN: 38 mg/dL, creatinine: 4.091.77 mg/dL (BUN and creatinine trending up), Ca: 8 mg/dL, GFR: 38 mL/min.   IVF: D5-NS @ 75 mL/hr (306 kcal).  Drips: Versed @ 3 mg/hr, Fentanyl @ 200 mcg/hr, Neo @ 5 mcg/min.    2/14 - Pt was intubated with OGT placed early this AM following a coughing spell with increased WOB and hemoptysis.  - Per Dr. Reginia NaasAlva's note and from discussion with RN, plan at this time is to start TF 2/15.  - Estimated nutrition needs updated based on intubation and admission weight (43.1 kg) used in estimating needs as CBW is inconsistent with trends throughout hospitalization.   Patient is currently intubated on ventilator support MV: 12 L/min Temp (24hrs), Avg:99.3 F (37.4 C), Min:98.6 F (37 C), Max:99.9 F (37.7 C) Propofol: none BP: 89/51 and MAP: 62. IVF: D5-NS @ 75 mL/hr (306 kcal).  Drips: Fentanyl @ 325 mcg/hr, Versed @ 1 mg/hr.    2/13 - Per review, pt consumed 100% of breakfast, 10% of lunch, and 75% of dinner yesterday; today she consumed 90% of breakfast.  - These meals provided a total of ~2326 kcal and ~74 grams of protein.  - Pt has been consuming oral nutrition supplements about 50% of the time that they're offered. - Weight -1.3 kg from 2/10-2/11 and no weight  recorded from yesterday or today.  - Per Dr. Karlyne Greenspan note this AM, plan for Megace to be added to assist with appetite.    Diet Order:  Diet NPO time specified  Skin:  Reviewed, no issues  Last BM:  2/12  Height:   Ht Readings from Last 1 Encounters:  05/21/16 5\' 5"  (1.651 m)    Weight:   Wt Readings from Last 1 Encounters:  05/18/16 104 lb 11.5 oz (47.5 kg)    Ideal Body Weight:  56.8 kg  BMI:  Body mass index  is 17.43 kg/m.  Estimated Nutritional Needs:   Kcal:  1485  Protein:  65-73 grams (1.5-1.7 grams/kg)  Fluid:  1.5-1.7L/day  EDUCATION NEEDS:   No education needs identified at this time    Trenton Gammon, MS, RD, LDN, CNSC Inpatient Clinical Dietitian Pager # [phone] After hours/weekend pager # [phone]

## 2016-05-18 NOTE — Progress Notes (Signed)
eLink Physician-Brief Progress Note Patient Name: Margretta Dittyngela N Alvillar DOB: 11/05/1985 MRN: [id number]   Date of Service  05/18/2016  HPI/Events of Note  1) oliguria 2) agitation, pulling at tubes, lines despite sedation  eICU Interventions  1) bolus saline 500cc now 2) renew restraints until rounds this AM     Intervention Category Major Interventions: Delirium, psychosis, severe agitation - evaluation and management Intermediate Interventions: Oliguria - evaluation and management  Max FickleDouglas McQuaid 05/18/2016, 1:15 AM

## 2016-05-18 NOTE — Progress Notes (Signed)
eLink Physician-Brief Progress Note Patient Name: Catherine Hernandez DOB: 27-Jul-1985 MRN: [id number]   Date of Service  05/18/2016  HPI/Events of Note  Oliguria - MAP = 65. CVP = 20. Creatinine = 2.05.  eICU Interventions  Will order: 1. Lasix 80 mg IV X 1 now.      Intervention Category Intermediate Interventions: Oliguria - evaluation and management  Antoria Lanza Eugene 05/18/2016, 4:40 PM

## 2016-05-18 NOTE — Progress Notes (Signed)
Patient ID: Catherine Hernandez, female   DOB: Jul 11, 1985, 31 y.o.   MRN: [id number]         [facility]  Date of Admission:  05/25/2016    Total days of antibiotics 13        Day 4 linezolid          Principal Problem:   Endocarditis of tricuspid valve Active Problems:   HCAP (healthcare-associated pneumonia)   MRSA bacteremia   Enterococcal bacteremia   Staphylococcus aureus bacteremia   Serratia infection   Polysubstance abuse   IVDU (intravenous drug user)   Opioid use disorder, severe, dependence (HCC)   Hyponatremia   Thrombocytopenia ([place])   Cavitary lung disease   Poor dentition   Acute kidney injury ([place])   Protein-calorie malnutrition, severe (HCC)   Symptomatic anemia   Septic embolism (HCC)   Tobacco abuse   History of pulmonary embolism   ARF (acute renal failure) ([place])   . benzonatate  200 mg Oral TID  . chlorhexidine gluconate (MEDLINE KIT)  15 mL Mouth Rinse BID  . Chlorhexidine Gluconate Cloth  6 each Topical Daily  . chlorpheniramine-HYDROcodone  5 mL Per Tube Q12H  . feeding supplement (PRO-STAT SUGAR FREE 64)  30 mL Per Tube Daily  . furosemide  80 mg Intravenous Once  . linezolid (ZYVOX) IV  600 mg Intravenous Q12H  . mouth rinse  15 mL Mouth Rinse QID  . multivitamin  15 mL Per Tube Daily  . pantoprazole sodium  40 mg Per Tube Q1200  . sodium chloride flush  3 mL Intravenous Q12H   Review of Systems: Review of Systems  Unable to perform ROS: Intubated    Past Medical History:  Diagnosis Date  . Anemia   . CAP (community acquired pneumonia) 11/16/2015  . Childhood asthma   . Endocarditis   . Enterococcal bacteremia   . Heroin abuse   . Hyponatremia   . Pneumonia 2007  . Polysubstance abuse     Social History  Substance Use Topics  . Smoking status: Current Every Day Smoker    Packs/day: 0.50    Years: 10.00    Types: Cigarettes  . Smokeless tobacco: Never Used  . Alcohol use No    Family History    Problem Relation Age of Onset  . Anesthesia problems Neg Hx   . Hypotension Neg Hx   . Malignant hyperthermia Neg Hx   . Pseudochol deficiency Neg Hx    No Known Allergies  OBJECTIVE: Vitals:   05/18/16 1400 05/18/16 1500 05/18/16 1552 05/18/16 1600  BP: (!) 96/49 (!) 100/51  (!) 99/46  Pulse: (!) 110 (!) 111  (!) 111  Resp: (!) 22 (!) 24  (!) 24  Temp:    97.9 F (36.6 C)  TempSrc:      SpO2: 100% 100% 100% 100%  Weight:      Height:       Body mass index is 17.43 kg/m.  Physical Exam  Constitutional:  She remains sedated on the ventilator. Decreased amounts of blood being suctioned from the endotracheal tube. She has a new right IJ central line.  Cardiovascular: Regular rhythm.   No murmur heard. Tachycardic.  Pulmonary/Chest: Effort normal.  Coarse rhonchi.    Lab Results Lab Results  Component Value Date   WBC 10.1 05/18/2016   HGB 7.3 (L) 05/18/2016   HCT 23.7 (L) 05/18/2016   MCV 89.1 05/18/2016   PLT 181 05/18/2016  Lab Results  Component Value Date   CREATININE 2.22 (H) 05/18/2016   BUN 43 (H) 05/18/2016   NA 136 05/18/2016   K 5.3 (H) 05/18/2016   CL 112 (H) 05/18/2016   CO2 18 (L) 05/18/2016    Lab Results  Component Value Date   ALT 9 (L) 05/17/2016   AST 23 05/17/2016   ALKPHOS 112 05/17/2016   BILITOT 1.1 05/17/2016     Microbiology: Recent Results (from the past 240 hour(s))  Culture, blood (routine x 2)     Status: Abnormal   Collection Time: 05/08/16  5:59 PM  Result Value Ref Range Status   Specimen Description BLOOD LEFT HAND  Final   Special Requests IN PEDIATRIC BOTTLE 1 CC  Final   Culture  Setup Time   Final    GRAM POSITIVE COCCI IN CLUSTERS IN PEDIATRIC BOTTLE CRITICAL RESULT CALLED TO, READ BACK BY AND VERIFIED WITH: PHARMD J GADHIA [id number] MLM    Culture (A)  Final    STAPHYLOCOCCUS AUREUS SUSCEPTIBILITIES PERFORMED ON PREVIOUS CULTURE WITHIN THE LAST 5 DAYS. Performed at [facility], [place]  [address], [place], [place] [address]    Report Status 05/11/2016 FINAL  Final  Culture, blood (routine x 2)     Status: Abnormal   Collection Time: 05/08/16  5:59 PM  Result Value Ref Range Status   Specimen Description BLOOD LEFT ARM  Final   Special Requests IN PEDIATRIC BOTTLE 1.5CC  Final   Culture  Setup Time   Final    GRAM POSITIVE COCCI IN CLUSTERS AEROBIC BOTTLE ONLY CRITICAL VALUE NOTED.  VALUE IS CONSISTENT WITH PREVIOUSLY REPORTED AND CALLED VALUE.    Culture (A)  Final    STAPHYLOCOCCUS AUREUS SUSCEPTIBILITIES PERFORMED ON PREVIOUS CULTURE WITHIN THE LAST 5 DAYS. Performed at [facility], [place] [address], [place], [place] [address]    Report Status 05/11/2016 FINAL  Final  Culture, blood (Routine X 2) w Reflex to ID Panel     Status: Abnormal   Collection Time: 05/08/16  9:49 PM  Result Value Ref Range Status   Specimen Description BLOOD LEFT HAND  Final   Special Requests IN PEDIATRIC BOTTLE 0.5CC  Final   Culture  Setup Time   Final    GRAM POSITIVE COCCI IN CLUSTERS IN PEDIATRIC BOTTLE CRITICAL RESULT CALLED TO, READ BACK BY AND VERIFIED WITH: [place] _0  002/05/18 MKELLY,MLT Performed at [facility], [place] [address], [place], [place] [address]    Culture METHICILLIN RESISTANT STAPHYLOCOCCUS AUREUS (A)  Final   Report Status 05/11/2016 FINAL  Final   Organism ID, Bacteria METHICILLIN RESISTANT STAPHYLOCOCCUS AUREUS  Final      Susceptibility   Methicillin resistant staphylococcus aureus - MIC*    CIPROFLOXACIN <=0.5 SENSITIVE Sensitive     ERYTHROMYCIN >=8 RESISTANT Resistant     GENTAMICIN <=0.5 SENSITIVE Sensitive     OXACILLIN >=4 RESISTANT Resistant     TETRACYCLINE >=16 RESISTANT Resistant     VANCOMYCIN <=0.5 SENSITIVE Sensitive     TRIMETH/SULFA <=10 SENSITIVE Sensitive     CLINDAMYCIN >=8 RESISTANT Resistant     RIFAMPIN <=0.5 SENSITIVE Sensitive     Inducible Clindamycin NEGATIVE Sensitive     * METHICILLIN RESISTANT  STAPHYLOCOCCUS AUREUS  Acid Fast Smear (AFB)     Status: None   Collection Time: 05/10/16  6:09 AM  Result Value Ref Range Status   AFB Specimen Processing Concentration  Final   Acid Fast  Smear Negative  Final    Comment: (NOTE) Performed At: [facility] [place], [place] [id number] Lindon Romp MD NW:[id number]    Source (AFB) SPUTUM  Final  Culture, blood (routine x 2)     Status: Abnormal   Collection Time: 05/10/16 11:18 AM  Result Value Ref Range Status   Specimen Description BLOOD RIGHT ARM  Final   Special Requests IN PEDIATRIC BOTTLE 3CC  Final   Culture  Setup Time   Final    GRAM POSITIVE COCCI IN CLUSTERS IN PEDIATRIC BOTTLE CRITICAL VALUE NOTED.  VALUE IS CONSISTENT WITH PREVIOUSLY REPORTED AND CALLED VALUE.    Culture (A)  Final    STAPHYLOCOCCUS AUREUS SUSCEPTIBILITIES PERFORMED ON PREVIOUS CULTURE WITHIN THE LAST 5 DAYS. Performed at [facility], [place] [address] [place], [place] [address]    Report Status 05/12/2016 FINAL  Final  Culture, blood (routine x 2)     Status: None   Collection Time: 05/13/16 10:46 AM  Result Value Ref Range Status   Specimen Description BLOOD LEFT ARM  Final   Special Requests IN PEDIATRIC BOTTLE 1CC  Final   Culture   Final    NO GROWTH 5 DAYS Performed at [facility], [place] [address], [place], [place] [address]    Report Status 05/18/2016 FINAL  Final  Culture, blood (routine x 2)     Status: None   Collection Time: 05/13/16 10:49 AM  Result Value Ref Range Status   Specimen Description BLOOD RIGHT HAND  Final   Special Requests IN PEDIATRIC BOTTLE 3CC  Final   Culture   Final    NO GROWTH 5 DAYS Performed at [facility], [place] [address], [place], [place] [address]    Report Status 05/18/2016 FINAL  Final  Culture, respiratory (NON-Expectorated)     Status: None (Preliminary result)   Collection Time: 05/17/16  4:01 AM  Result Value Ref Range Status   Specimen Description  BRONCHIAL WASHINGS RIGHT  Final   Special Requests NONE  Final   Gram Stain   Final    FEW WBC PRESENT, PREDOMINANTLY PMN FEW GRAM POSITIVE COCCI IN PAIRS Performed at [facility], [place] [address] [place], [place] [address]    Culture MODERATE STAPHYLOCOCCUS AUREUS  Final   Report Status PENDING  Incomplete    ASSESSMENT: She has severe tricuspid valve endocarditis. Over the past 6 months she has been bacteremic with MSSA, Serratia and now MRSA. She has had severe recurrent septic pulmonary emboli leading to cavitation and now hemoptysis. Not surprisingly her recent sputum culture is growing staph aureus.  PLAN: 1. Continue linezolid  Michel Bickers, MD [facility] for [facility] [phone] pager   [phone] cell 05/18/2016, 4:55 PM

## 2016-05-19 ENCOUNTER — Inpatient Hospital Stay (HOSPITAL_COMMUNITY): Payer: Medicaid Other

## 2016-05-19 LAB — BASIC METABOLIC PANEL
ANION GAP: 8 (ref 5–15)
BUN: 52 mg/dL — ABNORMAL HIGH (ref 6–20)
CALCIUM: 8 mg/dL — AB (ref 8.9–10.3)
CO2: 16 mmol/L — ABNORMAL LOW (ref 22–32)
Chloride: 113 mmol/L — ABNORMAL HIGH (ref 101–111)
Creatinine, Ser: 2.87 mg/dL — ABNORMAL HIGH (ref 0.44–1.00)
GFR, EST AFRICAN AMERICAN: 25 mL/min — AB (ref >60)
GFR, EST NON AFRICAN AMERICAN: 21 mL/min — AB (ref >60)
GLUCOSE: 126 mg/dL — AB (ref 65–99)
POTASSIUM: 6 mmol/L — AB (ref 3.5–5.1)
Sodium: 137 mmol/L (ref 135–145)

## 2016-05-19 LAB — GLUCOSE, CAPILLARY
GLUCOSE-CAPILLARY: 128 mg/dL — AB (ref 65–99)
Glucose-Capillary: 108 mg/dL — ABNORMAL HIGH (ref 65–99)
Glucose-Capillary: 122 mg/dL — ABNORMAL HIGH (ref 65–99)
Glucose-Capillary: 126 mg/dL — ABNORMAL HIGH (ref 65–99)
Glucose-Capillary: 127 mg/dL — ABNORMAL HIGH (ref 65–99)
Glucose-Capillary: 135 mg/dL — ABNORMAL HIGH (ref 65–99)

## 2016-05-19 LAB — CULTURE, RESPIRATORY W GRAM STAIN

## 2016-05-19 LAB — CBC
HCT: 26.6 % — ABNORMAL LOW (ref 36.0–46.0)
Hemoglobin: 8.1 g/dL — ABNORMAL LOW (ref 12.0–15.0)
MCH: 27.5 pg (ref 26.0–34.0)
MCHC: 30.5 g/dL (ref 30.0–36.0)
MCV: 90.2 fL (ref 78.0–100.0)
PLATELETS: 218 10*3/uL (ref 150–400)
RBC: 2.95 MIL/uL — AB (ref 3.87–5.11)
RDW: 19 % — ABNORMAL HIGH (ref 11.5–15.5)
WBC: 13.6 10*3/uL — AB (ref 4.0–10.5)

## 2016-05-19 LAB — ACID FAST SMEAR (AFB): ACID FAST SMEAR - AFSCU2: NEGATIVE

## 2016-05-19 LAB — CULTURE, RESPIRATORY

## 2016-05-19 MED ORDER — NEPRO/CARBSTEADY PO LIQD
1000.0000 mL | ORAL | Status: DC
  Administered 2016-05-19: 237 mL
  Administered 2016-05-20 – 2016-05-24 (×6): 1000 mL
  Filled 2016-05-19 (×10): qty 1000

## 2016-05-19 MED ORDER — SODIUM CHLORIDE 0.9% FLUSH: 10.0000 mL | INTRAVENOUS | Status: DC | PRN

## 2016-05-19 MED ORDER — DEXMEDETOMIDINE HCL IN NACL 400 MCG/100ML IV SOLN
0.4000 ug/kg/h | INTRAVENOUS | Status: DC
  Administered 2016-05-19: 0.4 ug/kg/h via INTRAVENOUS
  Filled 2016-05-19: qty 100

## 2016-05-19 MED ORDER — CHLORHEXIDINE GLUCONATE CLOTH 2 % EX PADS
6.0000 | MEDICATED_PAD | Freq: Every day | CUTANEOUS | Status: DC
  Administered 2016-05-19 – 2016-05-20 (×2): 6 via TOPICAL

## 2016-05-19 NOTE — Progress Notes (Signed)
Patient ID: Catherine Hernandez, female   DOB: Jan 31, 1986, 31 y.o.   MRN: [id number]         [facility] for Infectious Disease  Date of Admission:  05/22/2016    Total days of antibiotics 14        Day 5 linezolid          Principal Problem:   Endocarditis of tricuspid valve Active Problems:   HCAP (healthcare-associated pneumonia)   MRSA bacteremia   Enterococcal bacteremia   Staphylococcus aureus bacteremia   Serratia infection   Polysubstance abuse   IVDU (intravenous drug user)   Opioid use disorder, severe, dependence (HCC)   Hyponatremia   Thrombocytopenia ([place])   Cavitary lung disease   Poor dentition   Acute kidney injury ([place])   Protein-calorie malnutrition, severe (HCC)   Symptomatic anemia   Septic embolism (HCC)   Tobacco abuse   History of pulmonary embolism   ARF (acute renal failure) ([place])   . benzonatate  200 mg Oral TID  . chlorhexidine gluconate (MEDLINE KIT)  15 mL Mouth Rinse BID  . Chlorhexidine Gluconate Cloth  6 each Topical Daily  . chlorpheniramine-HYDROcodone  5 mL Per Tube Q12H  . feeding supplement (PRO-STAT SUGAR FREE 64)  30 mL Per Tube Daily  . linezolid (ZYVOX) IV  600 mg Intravenous Q12H  . mouth rinse  15 mL Mouth Rinse QID  . multivitamin  15 mL Per Tube Daily  . pantoprazole sodium  40 mg Per Tube Q1200  . sodium chloride flush  3 mL Intravenous Q12H   Review of Systems: Review of Systems  Unable to perform ROS: Intubated    Past Medical History:  Diagnosis Date  . Anemia   . CAP (community acquired pneumonia) 11/16/2015  . Childhood asthma   . Endocarditis   . Enterococcal bacteremia   . Heroin abuse   . Hyponatremia   . Pneumonia 2007  . Polysubstance abuse     Social History  Substance Use Topics  . Smoking status: Current Every Day Smoker    Packs/day: 0.50    Years: 10.00    Types: Cigarettes  . Smokeless tobacco: Never Used  . Alcohol use No    Family History  Problem Relation Age of Onset  .  Anesthesia problems Neg Hx   . Hypotension Neg Hx   . Malignant hyperthermia Neg Hx   . Pseudochol deficiency Neg Hx    No Known Allergies  OBJECTIVE: Vitals:   05/19/16 0600 05/19/16 0700 05/19/16 0741 05/19/16 0800  BP: (!) 107/59 109/63    Pulse: (!) 110 (!) 111    Resp: (!) 22 (!) 22    Temp:    99 F (37.2 C)  TempSrc:    Oral  SpO2: 98% 98% 97%   Weight:      Height:       Body mass index is 19.11 kg/m.  Physical Exam  Constitutional:  She remains sedated on the ventilator. Decreased amounts of blood being suctioned from the endotracheal tube. She has a new right IJ central line.  Cardiovascular: Regular rhythm.   No murmur heard. Tachycardic.  Pulmonary/Chest:  She is tachypneic. Coarse rhonchi.    Lab Results Lab Results  Component Value Date   WBC 10.1 05/18/2016   HGB 7.3 (L) 05/18/2016   HCT 23.7 (L) 05/18/2016   MCV 89.1 05/18/2016   PLT 181 05/18/2016    Lab Results  Component Value Date   CREATININE 2.22 (H) 05/18/2016  BUN 43 (H) 05/18/2016   NA 136 05/18/2016   K 5.3 (H) 05/18/2016   CL 112 (H) 05/18/2016   CO2 18 (L) 05/18/2016    Lab Results  Component Value Date   ALT 9 (L) 05/17/2016   AST 23 05/17/2016   ALKPHOS 112 05/17/2016   BILITOT 1.1 05/17/2016     Microbiology: Recent Results (from the past 240 hour(s))  Acid Fast Smear (AFB)     Status: None   Collection Time: 05/10/16  6:09 AM  Result Value Ref Range Status   AFB Specimen Processing Concentration  Final   Acid Fast Smear Negative  Final    Comment: (NOTE) Performed At: [facility] [address] [place], [place] [id number] Lindon Romp MD QB:[id number]    Source (AFB) SPUTUM  Final  Culture, blood (routine x 2)     Status: Abnormal   Collection Time: 05/10/16 11:18 AM  Result Value Ref Range Status   Specimen Description BLOOD RIGHT ARM  Final   Special Requests IN PEDIATRIC BOTTLE 3CC  Final   Culture  Setup Time   Final    GRAM POSITIVE COCCI  IN CLUSTERS IN PEDIATRIC BOTTLE CRITICAL VALUE NOTED.  VALUE IS CONSISTENT WITH PREVIOUSLY REPORTED AND CALLED VALUE.    Culture (A)  Final    STAPHYLOCOCCUS AUREUS SUSCEPTIBILITIES PERFORMED ON PREVIOUS CULTURE WITHIN THE LAST 5 DAYS. Performed at [facility], [place] [address], [place], [place] [address]    Report Status 05/12/2016 FINAL  Final  Culture, blood (routine x 2)     Status: None   Collection Time: 05/13/16 10:46 AM  Result Value Ref Range Status   Specimen Description BLOOD LEFT ARM  Final   Special Requests IN PEDIATRIC BOTTLE 1CC  Final   Culture   Final    NO GROWTH 5 DAYS Performed at [facility], [place] [address] [place], [place] [address]    Report Status 05/18/2016 FINAL  Final  Culture, blood (routine x 2)     Status: None   Collection Time: 05/13/16 10:49 AM  Result Value Ref Range Status   Specimen Description BLOOD RIGHT HAND  Final   Special Requests IN PEDIATRIC BOTTLE 3CC  Final   Culture   Final    NO GROWTH 5 DAYS Performed at [facility], [place] [address] [place], [place] [address]    Report Status 05/18/2016 FINAL  Final  Culture, respiratory (NON-Expectorated)     Status: None (Preliminary result)   Collection Time: 05/17/16  4:01 AM  Result Value Ref Range Status   Specimen Description BRONCHIAL WASHINGS RIGHT  Final   Special Requests NONE  Final   Gram Stain   Final    FEW WBC PRESENT, PREDOMINANTLY PMN FEW GRAM POSITIVE COCCI IN PAIRS Performed at [facility], [place] [address] [place],  [address]    Culture MODERATE STAPHYLOCOCCUS AUREUS  Final   Report Status PENDING  Incomplete    ASSESSMENT: 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 Her hemoptysis is decreasing.   PLAN: 1. Continue linezolid 2. Please call Dr. Tommy Medal [phone]) for any infectious disease questions this weekend  Michel Bickers, MD [facility] for [place] [phone] pager   [phone] cell 05/19/2016, 8:58 AM

## 2016-05-19 NOTE — Progress Notes (Signed)
[facility] Attending Note  Presenting HPI:  31 y.o. 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 Patient found to have septic emboli on CT angiogram in December and was started on Xarelto for systemic anticoagulation.  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 Over last 6 mnths, she has been bacteremic with MSSA, serratia & MRSA this time  Lines/ Tubes  ETT 2/14 >> RIJ 2/14 >>  IMAGING/STUDIES: CTA CHEST 03/22/16:  Per radiologist. Bilateral lower lobe pulmonary emboli with taper. Since which can be seen with chronic pulmonary emboli or possible vasculopathy secondary to septic emboli. Multiple cavitating and non-cavitating pneumonia/consolidation of varying ages consistent with septic emboli. Trace pleural effusions. Mild cardiomegaly with dilated right atrium. Suspect splenomegaly.  VENOUS DUPLEX BILATERAL LOWER EXTREMITIES 03/23/16: No evidence of DVT or SVT involving right or left lower extremity.  CT CHEST W/O 2016-05-12:  Previously reviewed by me. No pleural effusion or thickening. No pericardial effusion. Multiple cavitary nodules and nodular opacities throughout all lung zones. Precarinal lymphadenopathy with significant enlargement up to 1.5 cm in short axis by my review.  TTE 05/08/16:  LV normal in size with EF 60-65% & no regional wall motion abnormalities. LA normal in size. RA severely dilated. RV moderately dilated with preserved systolic function. No aortic stenosis or regurgitation. Aortic root normal in size. No mitral stenosis or regurgitation. No pulmonic stenosis or regurgitation. Severe tricuspid regurgitation. No pericardial effusion.  Significant events  2/14   At approximately 2am pt expeirenced coughing spell which  progressed into massive hemoptysis requiring emergent intubation by anesthesia. bronch >> RLL bleeding  SUBJ -   Critically ill, intubated & sedated Afebrile Remains oliguric Int Agitation   Temp:  [97.9 F (36.6 C)-99 F (37.2 C)] 99 F (37.2 C) (02/16 0800) Pulse Rate:  [104-129] 123 (02/16 0900) Resp:  [20-33] 33 (02/16 0900) BP: (95-121)/(46-78) 121/78 (02/16 0900) SpO2:  [91 %-100 %] 91 % (02/16 0900) FiO2 (%):  [40 %] 40 % (02/16 0757) Weight:  [114 lb 13.8 oz (52.1 kg)] 114 lb 13.8 oz (52.1 kg) (02/16 0440)   General:  Young cachectic female agitated on vent Neuro:  Agitated break thru, RASS -1 Not following commands HEENT:  Bloody secretions in ETT, much  decreased from 2/14 Cardiovascular:  Tachy, regular, no MRG Lungs:  Coarse bilateral breath sounds Abdomen:  Soft, non-distended Musculoskeletal:  No acute deformity or ROM limitation Skin:  Several punctate scabs throughout extremities     CBC Latest Ref Rng & Units 05/18/2016 05/17/2016 05/17/2016  WBC 4.0 - 10.5 K/uL 10.1 11.6(H) 10.1  Hemoglobin 12.0 - 15.0 g/dL 7.3(L) 7.9(L) 7.4(L)  Hematocrit 36.0 - 46.0 % 23.7(L) 25.4(L) 23.5(L)  Platelets 150 - 400 K/uL 181 202 165    BMP Latest Ref Rng & Units 05/18/2016 05/18/2016 05/17/2016  Glucose 65 - 99 mg/dL 98 90 91  BUN 6 - 20 mg/dL 24(M) 01(U) 27(O)  Creatinine 0.44 - 1.00 mg/dL 5.36(U) 4.40(H) 4.74(Q)  Sodium 135 - 145 mmol/L 136 136 134(L)  Potassium 3.5 - 5.1 mmol/L 5.3(H) 5.1 5.3(H)  Chloride 101 - 111 mmol/L 112(H) 110 110  CO2 22 - 32 mmol/L 18(L) 18(L) 18(L)  Calcium 8.9 - 10.3 mg/dL 7.6(L) 7.9(L) 8.0(L)      MICROBIOLOGY: HIV 2/3:  Nonreactive Blood Cultures x2 2/3:  1/2 Bottles MRSA Urine Culture 2/3:  <10,000 Colonies insignificant growth Sputum Culture 2/4:  Moderate MSSA Sputum AFB Smear/Culture 2/4 >>>  Smear Negative / Culture pending Blood Culture x4 2/5:  4/4 Positive MRSA Sputum AFB Smear/Culture 2/7 >>> Smear Negative / Culture  pending Blood Culture x1 2/7:  MRSA Influenza A/B PCR 2/8:  Negative   ANTIBIOTICS: Cefepime 2/3 (x1 dose) Vancomycin 2/3 - 2/8 Daptomycin 2/8 - 2/12 Zyvox 2/12 >>>  ASSESSMENT/PLAN:  31 y.o. female with history of IV drug use presenting with MRSA bacteremia . 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 Course  complicated by massive hemoptysis 2/14  for which she required intubation, xarelto stopped 2/12  PULMONARY A: Acute hypoxemic respiratory failure Hemoptysis >> bronch shows RLL source Pulmonary emboli  P:   Full vent support VAP bundle Bleeding temporised - no need for  bronchial artery embolisation here SBTs with goal extubation - mental status limiting   CARDIOVASCULAR A:  Hypotension - due to meds (versed)  & vent TV endocarditis with severe TR  P:  Telemetry monitoring Off neo gtt ok   RENAL A:   AKI - Low FeNa s/o prerenal but progressed to ATN Hyperkalemia Metabolic acidosis - resp component corrected  P:  Ct NS @ 125/h Follow BMP and UOP Holding further NSAID  GASTROINTESTINAL A:   Doubt UGIB - feel aspirated blood  P:   Protonix IV 40 q 24 NPO ct TF  HEMATOLOGIC A:   ABLA on anemia of critical illness. Pulmonary embolism  P:  Follow CBC Xarelto stopped 2/12 DC'd anticoagulation/VTE ppx  INFECTIOUS A:   Serratia bacteremia secondary to IVDA TV endocarditis MRSA bacteremia, MSSA multifocal cavitary pneumonia.  Leucocytosis  P:   ABX and cultures as above Poor surgical candidate ID following  ENDOCRINE A:   Hypoglycmia  P:   TFs  NEUROLOGIC A:   Acute metabolic encephalopahty Polysubstance abuse P:   RASS goal: 0 Dc versed, trial of precedex Fentanyl infusion for analgesia  Global: not a candidate for SNF placement as she has left SNF AMA. Palliative re-consulted 2/13.   FAMILY  -  Updates: no family available  - Inter-disciplinary family meet or Palliative Care meeting due by:  consulted.    My Cc time x 197m  Cyril Mourningakesh Katie Moch MD. FCCP. [facility] Pager [phone] If no response call [phone]     05/19/2016 9:17 AM

## 2016-05-19 NOTE — Progress Notes (Signed)
Nutrition Follow-up  DOCUMENTATION CODES:   Severe malnutrition in context of chronic illness, Underweight  INTERVENTION:  - Will change to Nepro given worsening renal panel, hyperkalemia and hyperphosphatemia.  - Will order Nepro @ 35 mL/hr which will provide 1512 kcal, 68 grams of protein, 605 mg Phos, 890 mg K, and 611 mL free water.   NUTRITION DIAGNOSIS:   Malnutrition related to chronic illness as evidenced by severe depletion of body fat, severe depletion of muscle mass. -ongoing   GOAL:   Patient will meet greater than or equal to 90% of their needs -unmet with TF not yet at goal rate, off throughout the morning.  MONITOR:   Vent status, TF tolerance, Weight trends, Labs, I & O's  ASSESSMENT:   31 y.o.femalewith medical history significant for IV drug abuse, endocarditis involving tricuspid valve, and nonadherence with her treatment plan, now presenting to the emergency department with generalized aches and pains, malaise, productive cough, and fatigue.   2/16 RN reports failed attempt to wean from vent this AM d/t severe agitation and need to place back on full vent support with sedation. TF was held when pt was weaning d/t concern that she would pull OGT. Prior to hold, pt was receiving Jevity 1.2 @ 40 mL/hr with 30 mL Prostat/day which provides 1252 kcal (83% re-estimated kcal need), 68 grams of protein, and 775 mL free water. TF has now been re-started @ 40 mL/hr and plan to increase to goal rate of Jevity 1.2 @ 50 mL/hr (without Prostat) at 1300. This regimen will provide 1440 kcal (95% re-estimated kcal need), 67 grams of protein, and 968 mL free water.   Noted no BM since 2/12 documented. Continue to use admission weight (43.1 kg) in estimating needs as weight was stable 2/3-2/10 and has been trending up since transfer to ICU and need for IVF.   Patient is currently intubated on ventilator support MV: 12.8 L/min Temp (24hrs), Avg:98.3 F (36.8 C), Min:97.9 F (36.6  C), Max:99 F (37.2 C) BP: 120/66 and MAP: 84.   Medications reviewed; PRN Dulcolax, 80 mg IV Lasix x1 dose yesterday, 15 mL liquid multivitamin/day, 40 mg Protonix per OGT/day.  Labs reviewed; K: 6 mmol/L, Cl: 113 mmol/L, BUN: 52 mmol/L, creatinine: 2.87 mg/dL, Ca: 8 mg/dL, Phos: 9.2 mg/dL, GFR: 21 mL/min.   IVF: NS @ 125 mL/hr.  Drips: Fentanyl @ 125 mcg/hr, Versed @ 2.5 mg/hr.      2/15 - Spoke with RN about plan for TF.  - Weight +2 kg from yesterday; will continue to use admission weight in estimating needs. - In rounds RN reported minimal drainage from OGT which is mostly bile-like but that when suctioned pt outputs large amount of bloody drainage (600cc last night per RN).  - Will order TF with slow advancement d/t concern for hemodynamic stability and possible electrolyte shift if TF is initiated at goal rate.  - Will adjust goal rate/regimen if D5 continues to be needed once TF approaches goal rate.  Patient is currently intubated on ventilator support MV: 11.3 L/min Temp (24hrs), Avg:98.4 F (36.9 C), Min:97.6 F (36.4 C), Max:99.2 F (37.3 C) BP:98/52 and MAP: 68 IVF: D5-NS @ 75 mL/hr (306 kcal).  Drips: Versed @ 3 mg/hr, Fentanyl @ 200 mcg/hr, Neo @ 5 mcg/min.    2/14 - Pt was intubated with OGT placed early this AM following a coughing spell with increased WOB and hemoptysis.  - Per Dr. Reginia Naas note and from discussion with RN, plan at this  time is to start TF 2/15.  - Estimated nutrition needs updated based on intubation and admission weight (43.1 kg) used in estimating needs as CBW is inconsistent with trends throughout hospitalization.   Patient is currently intubated on ventilator support MV: 12L/min Temp (24hrs), Avg:99.3 F (37.4 C), Min:98.6 F (37 C), Max:99.9 F (37.7 C) Propofol: none BP: 89/51 and MAP: 62. IVF:D5-NS @ 75 mL/hr (306 kcal).  Drips:Fentanyl @ 325 mcg/hr, Versed @ 1 mg/hr.     Diet Order:  Diet NPO time specified  Skin:   Reviewed, no issues  Last BM:  2/12  Height:   Ht Readings from Last 1 Encounters:  05/18/16 5\' 5"  (1.651 m)    Weight:   Wt Readings from Last 1 Encounters:  05/19/16 114 lb 13.8 oz (52.1 kg)    Ideal Body Weight:  56.8 kg  BMI:  Body mass index is 19.11 kg/m.  Estimated Nutritional Needs:   Kcal:  1514  Protein:  65-73 grams (1.5-1.7 grams/kg)  Fluid:  1.5-1.7L/day  EDUCATION NEEDS:   No education needs identified at this time    Trenton GammonJessica Shykeem Resurreccion, MS, RD, LDN, CNSC Inpatient Clinical Dietitian Pager # [phone] After hours/weekend pager # [phone]

## 2016-05-20 ENCOUNTER — Telehealth: Payer: Self-pay | Admitting: Emergency Medicine

## 2016-05-20 ENCOUNTER — Inpatient Hospital Stay (HOSPITAL_COMMUNITY): Payer: Medicaid Other

## 2016-05-20 LAB — GLUCOSE, CAPILLARY
GLUCOSE-CAPILLARY: 103 mg/dL — AB (ref 65–99)
Glucose-Capillary: 103 mg/dL — ABNORMAL HIGH (ref 65–99)
Glucose-Capillary: 114 mg/dL — ABNORMAL HIGH (ref 65–99)
Glucose-Capillary: 121 mg/dL — ABNORMAL HIGH (ref 65–99)
Glucose-Capillary: 115 mg/dL — ABNORMAL HIGH (ref 65–99)
Glucose-Capillary: 90 mg/dL (ref 65–99)

## 2016-05-20 LAB — CBC
HCT: 24.9 % — ABNORMAL LOW (ref 36.0–46.0)
HEMOGLOBIN: 7.6 g/dL — AB (ref 12.0–15.0)
MCH: 27.5 pg (ref 26.0–34.0)
MCHC: 30.5 g/dL (ref 30.0–36.0)
MCV: 90.2 fL (ref 78.0–100.0)
PLATELETS: 214 10*3/uL (ref 150–400)
RBC: 2.76 MIL/uL — ABNORMAL LOW (ref 3.87–5.11)
RDW: 18.8 % — AB (ref 11.5–15.5)
WBC: 11.1 10*3/uL — ABNORMAL HIGH (ref 4.0–10.5)

## 2016-05-20 LAB — PHOSPHORUS: PHOSPHORUS: 10 mg/dL — AB (ref 2.5–4.6)

## 2016-05-20 LAB — BASIC METABOLIC PANEL
Anion gap: 6 (ref 5–15)
BUN: 52 mg/dL — ABNORMAL HIGH (ref 6–20)
CALCIUM: 7.6 mg/dL — AB (ref 8.9–10.3)
CHLORIDE: 115 mmol/L — AB (ref 101–111)
CO2: 16 mmol/L — ABNORMAL LOW (ref 22–32)
Creatinine, Ser: 2.51 mg/dL — ABNORMAL HIGH (ref 0.44–1.00)
GFR, EST AFRICAN AMERICAN: 29 mL/min — AB (ref >60)
GFR, EST NON AFRICAN AMERICAN: 25 mL/min — AB (ref >60)
Glucose, Bld: 120 mg/dL — ABNORMAL HIGH (ref 65–99)
Potassium: 5.3 mmol/L — ABNORMAL HIGH (ref 3.5–5.1)
SODIUM: 137 mmol/L (ref 135–145)

## 2016-05-20 LAB — MAGNESIUM: MAGNESIUM: 1.8 mg/dL (ref 1.7–2.4)

## 2016-05-20 NOTE — Progress Notes (Signed)
[facility] Attending Note  Presenting HPI:  31 y.o. 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 Patient found to have septic emboli on CT angiogram in December and was started on Xarelto for systemic anticoagulation.  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 Over last 6 mnths, she has been bacteremic with MSSA, serratia & MRSA this time  Lines/ Tubes  ETT 2/14 >> RIJ 2/14 >>  IMAGING/STUDIES: CTA CHEST 03/22/16:  Per radiologist. Bilateral lower lobe pulmonary emboli with taper. Since which can be seen with chronic pulmonary emboli or possible vasculopathy secondary to septic emboli. Multiple cavitating and non-cavitating pneumonia/consolidation of varying ages consistent with septic emboli. Trace pleural effusions. Mild cardiomegaly with dilated right atrium. Suspect splenomegaly.  VENOUS DUPLEX BILATERAL LOWER EXTREMITIES 03/23/16: No evidence of DVT or SVT involving right or left lower extremity.  CT CHEST W/O 05/19/2016:  Previously reviewed by me. No pleural effusion or thickening. No pericardial effusion. Multiple cavitary nodules and nodular opacities throughout all lung zones. Precarinal lymphadenopathy with significant enlargement up to 1.5 cm in short axis by my review.  TTE 05/08/16:  LV normal in size with EF 60-65% & no regional wall motion abnormalities. LA normal in size. RA severely dilated. RV moderately dilated with preserved systolic function. No aortic stenosis or regurgitation. Aortic root normal in size. No mitral stenosis or regurgitation. No pulmonic stenosis or regurgitation. Severe tricuspid regurgitation. No pericardial effusion.  Significant events  2/14   At approximately 2am pt expeirenced coughing spell which  progressed into massive hemoptysis requiring emergent intubation by anesthesia. bronch >> RLL bleeding  SUBJ -  On sedation, some agitation with exam Tolerating some high PSV even on sedation  Temp:  [97.8 F (36.6 C)-98.8 F (37.1 C)] 98.8 F (37.1 C) (02/17 0800) Pulse Rate:  [99-114] 114 (02/17 0918) Resp:  [20-55] 55 (02/17 0918) BP: (105-126)/(61-75) 117/75 (02/17 0900) SpO2:  [97 %-100 %] 98 % (02/17 0924) FiO2 (%):  [40 %] 40 % (02/17 0926) Weight:  [56.7 kg (125 lb)] 56.7 kg (125 lb) (02/17 0454)   General:  Ill appearing woman, intubated Neuro:  Sedate, wakes easily to voice, moves all ext, follows some commands HEENT: ETT clear Cardiovascular:  Regular, tachy Lungs:  Coarse BS B, no wheeze Abdomen:  Soft, benign Musculoskeletal: no deformities Skin:  Multiple abrasions and punctate scabs arms and legs   Recent Labs Lab 05/18/16 0938 05/19/16 0900 05/20/16 0500  HGB 7.3* 8.1* 7.6*  HCT 23.7* 26.6* 24.9*  WBC 10.1 13.6* 11.1*  PLT 181 218 214    Recent Labs Lab 05/17/16 0209  05/17/16 2319 05/18/16 0938 05/18/16 1610 05/19/16 0900 05/20/16 0500  NA 133*  < > 134* 136 136 137 137  K 5.5*  < > 5.3* 5.1 5.3* 6.0* 5.3*  CL 108  < > 110 110 112* 113* 115*  CO2 20*  < > 18* 18* 18* 16* 16*  GLUCOSE 191*  < > 91 90 98 126* 120*  BUN 27*  < > 38* 40* 43* 52* 52*  CREATININE 1.25*  < > 1.77* 2.05* 2.22* 2.87* 2.51*  CALCIUM 7.9*  < > 8.0* 7.9* 7.6* 8.0* 7.6*  MG 1.6*  --   --  1.8  --   --  1.8  PHOS  --   --   --  9.2*  --   --  10.0*  < > = values in this interval not displayed.  Recent Labs Lab 05/15/16 1331 05/17/16 0225 05/17/16 0410  PHART 7.380 7.151*  --   PCO2ART 28.9* 55.9*  --   PO2ART 71.3* 441*  --   HCO3 16.6* 18.7* 17.7*  O2SAT 91.9 99.4 98.4    Recent Labs Lab 05/17/16 0209  AST 23  ALT 9*  ALKPHOS 112  BILITOT 1.1  PROT 8.4*  ALBUMIN 1.7*      MICROBIOLOGY: HIV 2/3:  Nonreactive Blood Cultures x2 2/3:  1/2 Bottles  MRSA Urine Culture 2/3:  <10,000 Colonies insignificant growth Sputum Culture 2/4:  Moderate MSSA Sputum AFB Smear/Culture 2/4 >>>  Smear Negative / Culture pending Blood Culture x4 2/5:  4/4 Positive MRSA Sputum AFB Smear/Culture 2/7 >>> Smear Negative / Culture pending Blood Culture x1 2/7:  MRSA Influenza A/B PCR 2/8:  Negative   ANTIBIOTICS: Cefepime 2/3 (x1 dose) Vancomycin 2/3 - 2/8 Daptomycin 2/8 - 2/12 Zyvox 2/12 >>>  ASSESSMENT/PLAN:  31 y.o. female with history of IV drug use presenting with MRSA bacteremia . 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 Course  complicated by massive hemoptysis 2/14  for which she required intubation, xarelto stopped 2/12  PULMONARY A: Acute hypoxemic respiratory failure Hemoptysis >> bronch shows RLL source Pulmonary emboli  P:   PRVC 8cc/kg Continue efforts at PSV,  VAP bundle Bleeding has stopped with current measures Anticoagulation held, ? Whether we will be able to restart in coming days.  May require trach to progress w vent weaning   CARDIOVASCULAR A:  Hypotension - due to meds (versed)  & vent, resolved TV endocarditis with severe TR P:  Continue telemetry  RENAL A:   AKI - Low FeNa s/o prerenal but progressed to ATN, appears to have plateaued 2/16 Hyperkalemia Metabolic acidosis - resp component corrected  P:  Decrease NS to 50cc/h Follow UOP and BMP Hold nephrotoxins, NSAID  GASTROINTESTINAL A:   Doubt UGIB - feel aspirated blood P:   PPI as ordered NPO Continue TF  HEMATOLOGIC A:   ABLA + anemia of critical illness. Pulmonary embolism  P:  Follow CBC Xarelto on hold, will assess for ability to restart over next several days.  Hold other anti-coag, DVT prophy  INFECTIOUS A:   Serratia bacteremia secondary to IVDA TV endocarditis MRSA bacteremia, MSSA multifocal cavitary  pneumonia.  Leucocytosis  P:   ABX and cultures as above linezolid as ordered (since 2/12) Appreciate ID assistance  ENDOCRINE A:   Hypoglycmia  P:   Follow CBG On TF  NEUROLOGIC A:   Acute metabolic encephalopahty Polysubstance abuse P:   RASS goal: 0 to -1 On versed + fentanyl MS and agitation are a barrier to resp progress.   Global: not a candidate for SNF placement as she has left SNF AMA. Palliative re-consulted 2/13.   FAMILY  - Updates: no family present 2/17  - Inter-disciplinary family meet or Palliative Care meeting due by:  consulted.   Independent CC time 33 minutes.   Levy Pupaobert Treesa Mccully, MD, PhD 05/20/2016, 11:12 AM [facility] [phone] or if no answer [phone]

## 2016-05-21 ENCOUNTER — Inpatient Hospital Stay (HOSPITAL_COMMUNITY): Payer: Medicaid Other

## 2016-05-21 DIAGNOSIS — I368 Other nonrheumatic tricuspid valve disorders: Secondary | ICD-10-CM

## 2016-05-21 LAB — CBC
HEMATOCRIT: 24.8 % — AB (ref 36.0–46.0)
HEMOGLOBIN: 7.7 g/dL — AB (ref 12.0–15.0)
MCH: 28 pg (ref 26.0–34.0)
MCHC: 31 g/dL (ref 30.0–36.0)
MCV: 90.2 fL (ref 78.0–100.0)
Platelets: 166 10*3/uL (ref 150–400)
RBC: 2.75 MIL/uL — ABNORMAL LOW (ref 3.87–5.11)
RDW: 19.1 % — AB (ref 11.5–15.5)
WBC: 9.6 10*3/uL (ref 4.0–10.5)

## 2016-05-21 LAB — GLUCOSE, CAPILLARY
GLUCOSE-CAPILLARY: 110 mg/dL — AB (ref 65–99)
GLUCOSE-CAPILLARY: 90 mg/dL (ref 65–99)
GLUCOSE-CAPILLARY: 92 mg/dL (ref 65–99)
Glucose-Capillary: 85 mg/dL (ref 65–99)
Glucose-Capillary: 98 mg/dL (ref 65–99)
Glucose-Capillary: 119 mg/dL — ABNORMAL HIGH (ref 65–99)

## 2016-05-21 LAB — BASIC METABOLIC PANEL
Anion gap: 6 (ref 5–15)
BUN: 51 mg/dL — ABNORMAL HIGH (ref 6–20)
CHLORIDE: 116 mmol/L — AB (ref 101–111)
CO2: 15 mmol/L — AB (ref 22–32)
CREATININE: 2.29 mg/dL — AB (ref 0.44–1.00)
Calcium: 7.7 mg/dL — ABNORMAL LOW (ref 8.9–10.3)
GFR calc Af Amer: 32 mL/min — ABNORMAL LOW (ref >60)
GFR calc non Af Amer: 28 mL/min — ABNORMAL LOW (ref >60)
GLUCOSE: 130 mg/dL — AB (ref 65–99)
Potassium: 4.8 mmol/L (ref 3.5–5.1)
Sodium: 137 mmol/L (ref 135–145)

## 2016-05-21 LAB — MAGNESIUM: MAGNESIUM: 1.8 mg/dL (ref 1.7–2.4)

## 2016-05-21 NOTE — Progress Notes (Signed)
Chart reviewed and discussed with her bedside nurse.  Ms. Catherine Hernandez had an aunt come to visit today.  Dr. Delton CoombesByrum was able to meet with them.  Hope at this time is that Ms. Catherine Hernandez will be able to be extubated and included in further discussion regarding goals.  Our team will be happy to meet again with Ms. Catherine Hernandez if she is able to be extubated.  If it does not appear that successful extubation is likely, palliative will be happy to be involved in further conversations with family regarding both long and short-term goals moving forward.    Please feel free to contact our team at any time to discuss how we may assist in the care of Ms. Catherine Hernandez moving forward.  Otherwise, someone from the palliative medicine team will check in with PCCM early next week to determine how we may be of assistance in this difficult case.  Romie MinusGene Ghazal Pevey, MD [facility] Palliative Medicine Team [phone]  No charge note

## 2016-05-21 NOTE — Progress Notes (Signed)
[facility] Attending Note  Presenting HPI:  31 y.o. 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 Patient found to have septic emboli on CT angiogram in December and was started on Xarelto for systemic anticoagulation.  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 Over last 6 mnths, she has been bacteremic with MSSA, serratia & MRSA this time  Lines/ Tubes  ETT 2/14 >> RIJ 2/14 >>  IMAGING/STUDIES: CTA CHEST 03/22/16:  Per radiologist. Bilateral lower lobe pulmonary emboli with taper. Since which can be seen with chronic pulmonary emboli or possible vasculopathy secondary to septic emboli. Multiple cavitating and non-cavitating pneumonia/consolidation of varying ages consistent with septic emboli. Trace pleural effusions. Mild cardiomegaly with dilated right atrium. Suspect splenomegaly.  VENOUS DUPLEX BILATERAL LOWER EXTREMITIES 03/23/16: No evidence of DVT or SVT involving right or left lower extremity.  CT CHEST W/O 05/31/2016:  Previously reviewed by me. No pleural effusion or thickening. No pericardial effusion. Multiple cavitary nodules and nodular opacities throughout all lung zones. Precarinal lymphadenopathy with significant enlargement up to 1.5 cm in short axis by my review.  TTE 05/08/16:  LV normal in size with EF 60-65% & no regional wall motion abnormalities. LA normal in size. RA severely dilated. RV moderately dilated with preserved systolic function. No aortic stenosis or regurgitation. Aortic root normal in size. No mitral stenosis or regurgitation. No pulmonic stenosis or regurgitation. Severe tricuspid regurgitation. No pericardial effusion.  Significant events  2/14   At approximately 2am pt expeirenced coughing spell which  progressed into massive hemoptysis requiring emergent intubation by anesthesia. bronch >> RLL bleeding  SUBJ -  Tolerated PSV 15 this am for 2 h Some intermittent agitation.   Temp:  [97.6 F (36.4 C)-98.4 F (36.9 C)] 97.6 F (36.4 C) (02/18 0800) Pulse Rate:  [102-113] 111 (02/18 0900) Resp:  [21-38] 29 (02/18 0900) BP: (113-142)/(65-89) 126/77 (02/18 0900) SpO2:  [97 %-100 %] 97 % (02/18 1048) FiO2 (%):  [30 %-35 %] 30 % (02/18 1048) Weight:  [57.6 kg (126 lb 15.8 oz)] 57.6 kg (126 lb 15.8 oz) (02/18 0327)   General:  Ill young woman, mild agitation Neuro:  Awake and looking around, not tracking, moves all ext, not following commands HEENT: ETT in position, clear Cardiovascular: regular, tachy Lungs:  Coarse BS bilaterally Abdomen:  Soft, benign Musculoskeletal: no deformities Skin:  Many small punctate abrasions on skin of legs and arms.    Recent Labs Lab 05/19/16 0900 05/20/16 0500 05/21/16 0353  HGB 8.1* 7.6* 7.7*  HCT 26.6* 24.9* 24.8*  WBC 13.6* 11.1* 9.6  PLT 218 214 166    Recent Labs Lab 05/17/16 0209  05/18/16 0938 05/18/16 1610 05/19/16 0900 05/20/16 0500 05/21/16 0353  NA 133*  < > 136 136 137 137 137  K 5.5*  < > 5.1 5.3* 6.0* 5.3* 4.8  CL 108  < > 110 112* 113* 115* 116*  CO2 20*  < > 18* 18* 16* 16* 15*  GLUCOSE 191*  < > 90 98 126* 120* 130*  BUN 27*  < > 40* 43* 52* 52* 51*  CREATININE 1.25*  < > 2.05* 2.22* 2.87* 2.51* 2.29*  CALCIUM 7.9*  < > 7.9* 7.6* 8.0* 7.6* 7.7*  MG 1.6*  --  1.8  --   --  1.8  1.8  PHOS  --   --  9.2*  --   --  10.0*  --   < > = values in this interval not displayed.  Recent Labs Lab 05/15/16 1331 05/17/16 0225 05/17/16 0410  PHART 7.380 7.151*  --   PCO2ART 28.9* 55.9*  --   PO2ART 71.3* 441*  --   HCO3 16.6* 18.7* 17.7*  O2SAT 91.9 99.4 98.4    Recent Labs Lab 05/17/16 0209  AST 23  ALT 9*  ALKPHOS 112  BILITOT 1.1  PROT 8.4*  ALBUMIN 1.7*      MICROBIOLOGY: HIV 2/3:  Nonreactive Blood  Cultures x2 2/3:  1/2 Bottles MRSA Urine Culture 2/3:  <10,000 Colonies insignificant growth Sputum Culture 2/4:  Moderate MSSA Sputum AFB Smear/Culture 2/4 >>>  Smear Negative / Culture pending Blood Culture x4 2/5:  4/4 Positive MRSA Sputum AFB Smear/Culture 2/7 >>> Smear Negative / Culture pending Blood Culture x1 2/7:  MRSA Influenza A/B PCR 2/8:  Negative   ANTIBIOTICS: Cefepime 2/3 (x1 dose) Vancomycin 2/3 - 2/8 Daptomycin 2/8 - 2/12 Zyvox 2/12 >>>  ASSESSMENT/PLAN:  31 y.o. female with history of IV drug use presenting with MRSA bacteremia . 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 Course  complicated by massive hemoptysis 2/14  for which she required intubation, xarelto stopped 2/12  PULMONARY A: Acute hypoxemic respiratory failure Hemoptysis >> bronch shows RLL source Pulmonary emboli  P:   PRVC 8cc/kg Continue PSV as tolerated, ? Attempt extubation this week VAP bundle No further bleeding noted; consider restart anticoagulation this week May yet require trach. Discussed with her aunt as below   CARDIOVASCULAR A:  Hypotension - due to meds (versed)  & vent, resolved TV endocarditis with severe TR P:  Telemetry Not a surgical candidate   RENAL A:   AKI - Low FeNa s/o prerenal but progressed to ATN, appears to have plateaued 2/16 Hyperkalemia Non-AG Metabolic acidosis  P:  Stop IVF Follow BMP and UOP Will need to start diuresis soon, note 17L positive Hold nephrotoxins, NSAID  GASTROINTESTINAL A:   Doubt UGIB - feel aspirated blood P:   PPI as ordered NPO TF running  HEMATOLOGIC A:   ABLA + anemia of critical illness. Pulmonary embolism  P:  Follow CBC xarelto held, ? Timing restart anti-coag (heparin)  INFECTIOUS A:   Serratia and MRSA bacteremia secondary to IVDA TV endocarditis MRSA bacteremia, MSSA multifocal  cavitary pneumonia.  Leucocytosis  P:   ABX and cultures as above linezolid as ordered since 2/12 Appreciate ID assistance  ENDOCRINE A:   Hypoglycmia  P:   Follow CBG On TF  NEUROLOGIC A:   Acute metabolic encephalopahty Polysubstance abuse P:   RASS goal: 0 to -1 On versed + fentanyl Wean sedating meds as her agitation will permit  Global: not a candidate for SNF placement as she has left SNF AMA. Palliative re-consulted 2/13. Dismal situation, as it is unclear that she will be able to comply with the med regimen required to have a chance to resolve her TV endocarditis.    FAMILY  - Updates: Dr Delton Coombes spoke with pt's aunt and cousin at bedside 2/18, her closest family. Pt has not wanted her mom involved in her care. They understandably are exasperated with the situation since pt's drug habit has not allowed her to ever stay on abx, recover from  the two underlying problems - endocarditis and IVDA. They are trying to factor her overall prognosis into our acute decision making regarding trach, next steps for this hospitalization. I believe that this is appropriate, and we will continue to seek their input. Her aunt does not believe that pt will every get clean - this influences every decision we make as we go forward. I hope that pt will extubate and that we can include her in these discussions as well. Will continue to follow with family.   - Inter-disciplinary family meet or Palliative Care meeting due by:  consulted.   Independent CC time 35 minutes.   Levy Pupa, MD, PhD 05/21/2016, 11:58 AM [facility] [phone] or if no answer [phone]

## 2016-05-22 DIAGNOSIS — E43 Unspecified severe protein-calorie malnutrition: Secondary | ICD-10-CM

## 2016-05-22 DIAGNOSIS — F1990 Other psychoactive substance use, unspecified, uncomplicated: Secondary | ICD-10-CM

## 2016-05-22 LAB — CBC
HEMATOCRIT: 24.9 % — AB (ref 36.0–46.0)
Hemoglobin: 7.7 g/dL — ABNORMAL LOW (ref 12.0–15.0)
MCH: 27.9 pg (ref 26.0–34.0)
MCHC: 30.9 g/dL (ref 30.0–36.0)
MCV: 90.2 fL (ref 78.0–100.0)
Platelets: 154 10*3/uL (ref 150–400)
RBC: 2.76 MIL/uL — ABNORMAL LOW (ref 3.87–5.11)
RDW: 19.4 % — AB (ref 11.5–15.5)
WBC: 9.3 10*3/uL (ref 4.0–10.5)

## 2016-05-22 LAB — BASIC METABOLIC PANEL
ANION GAP: 4 — AB (ref 5–15)
BUN: 47 mg/dL — ABNORMAL HIGH (ref 6–20)
CALCIUM: 7.7 mg/dL — AB (ref 8.9–10.3)
CO2: 17 mmol/L — AB (ref 22–32)
Chloride: 118 mmol/L — ABNORMAL HIGH (ref 101–111)
Creatinine, Ser: 1.97 mg/dL — ABNORMAL HIGH (ref 0.44–1.00)
GFR calc Af Amer: 39 mL/min — ABNORMAL LOW (ref >60)
GFR calc non Af Amer: 33 mL/min — ABNORMAL LOW (ref >60)
GLUCOSE: 161 mg/dL — AB (ref 65–99)
POTASSIUM: 4.3 mmol/L (ref 3.5–5.1)
Sodium: 139 mmol/L (ref 135–145)

## 2016-05-22 LAB — GLUCOSE, CAPILLARY
GLUCOSE-CAPILLARY: 128 mg/dL — AB (ref 65–99)
GLUCOSE-CAPILLARY: 111 mg/dL — AB (ref 65–99)
Glucose-Capillary: 115 mg/dL — ABNORMAL HIGH (ref 65–99)
Glucose-Capillary: 102 mg/dL — ABNORMAL HIGH (ref 65–99)
Glucose-Capillary: 114 mg/dL — ABNORMAL HIGH (ref 65–99)

## 2016-05-22 LAB — OCCULT BLOOD X 1 CARD TO LAB, STOOL: FECAL OCCULT BLD: NEGATIVE

## 2016-05-22 MED ORDER — FUROSEMIDE 10 MG/ML IJ SOLN
40.0000 mg | Freq: Every day | INTRAMUSCULAR | Status: DC
  Administered 2016-05-22 – 2016-05-23 (×2): 40 mg via INTRAVENOUS
  Filled 2016-05-22 (×2): qty 4

## 2016-05-22 MED ORDER — ACETAMINOPHEN 160 MG/5ML PO SOLN
650.0000 mg | Freq: Four times a day (QID) | ORAL | Status: DC | PRN
  Administered 2016-05-25: 650 mg
  Filled 2016-05-22: qty 20.3

## 2016-05-22 MED ORDER — DEXMEDETOMIDINE HCL IN NACL 400 MCG/100ML IV SOLN
0.4000 ug/kg/h | INTRAVENOUS | Status: DC
  Administered 2016-05-22 (×2): 1.4 ug/kg/h via INTRAVENOUS
  Administered 2016-05-22: 0.4 ug/kg/h via INTRAVENOUS
  Administered 2016-05-23: 1.9 ug/kg/h via INTRAVENOUS
  Administered 2016-05-23: 1.4 ug/kg/h via INTRAVENOUS
  Administered 2016-05-23: 1.8 ug/kg/h via INTRAVENOUS
  Administered 2016-05-23: 1.5 ug/kg/h via INTRAVENOUS
  Administered 2016-05-23: 1.4 ug/kg/h via INTRAVENOUS
  Administered 2016-05-24: 2.3 ug/kg/h via INTRAVENOUS
  Administered 2016-05-24: 2 ug/kg/h via INTRAVENOUS
  Administered 2016-05-24 (×2): 2.8 ug/kg/h via INTRAVENOUS
  Administered 2016-05-24: 2.5 ug/kg/h via INTRAVENOUS
  Administered 2016-05-24: 2.1 ug/kg/h via INTRAVENOUS
  Administered 2016-05-24: 2.5 ug/kg/h via INTRAVENOUS
  Administered 2016-05-24: 2.8 ug/kg/h via INTRAVENOUS
  Administered 2016-05-24: 2.3 ug/kg/h via INTRAVENOUS
  Administered 2016-05-25 (×7): 2.8 ug/kg/h via INTRAVENOUS
  Filled 2016-05-22 (×26): qty 100

## 2016-05-22 NOTE — Progress Notes (Signed)
[facility] Attending Note  Presenting HPI:  31 y.o. 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 Patient found to have septic emboli on CT angiogram in December and was started on Xarelto for systemic anticoagulation.  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 Over last 6 mnths, she has been bacteremic with MSSA, serratia & MRSA this time  Lines/ Tubes ETT 2/14 >> RIJ 2/14 >>  IMAGING/STUDIES: CTA CHEST 03/22/16:  Per radiologist. Bilateral lower lobe pulmonary emboli with taper. Since which can be seen with chronic pulmonary emboli or possible vasculopathy secondary to septic emboli. Multiple cavitating and non-cavitating pneumonia/consolidation of varying ages consistent with septic emboli. Trace pleural effusions. Mild cardiomegaly with dilated right atrium. Suspect splenomegaly.  VENOUS DUPLEX BILATERAL LOWER EXTREMITIES 03/23/16: No evidence of DVT or SVT involving right or left lower extremity.  CT CHEST W/O 05/07/2016:  Previously reviewed by me. No pleural effusion or thickening. No pericardial effusion. Multiple cavitary nodules and nodular opacities throughout all lung zones. Precarinal lymphadenopathy with significant enlargement up to 1.5 cm in short axis by my review.  TTE 05/08/16:  LV normal in size with EF 60-65% & no regional wall motion abnormalities. LA normal in size. RA severely dilated. RV moderately dilated with preserved systolic function. No aortic stenosis or regurgitation. Aortic root normal in size. No mitral stenosis or regurgitation. No pulmonic stenosis or regurgitation. Severe tricuspid regurgitation. No pericardial effusion.  Significant events  2/14   At approximately 2am pt expeirenced coughing spell which  progressed into massive hemoptysis requiring emergent intubation by anesthesia. bronch >> RLL bleeding  SUBJ -  tachypneic on PSV this am Currently on versed + fentanyl  Temp:  [97.2 F (36.2 C)-99.5 F (37.5 C)] 99.5 F (37.5 C) (02/19 0800) Pulse Rate:  [99-129] 129 (02/19 0800) Resp:  [23-34] 28 (02/19 0800) BP: (122-163)/(73-98) 148/92 (02/19 0800) SpO2:  [97 %-100 %] 97 % (02/19 0800) FiO2 (%):  [30 %] 30 % (02/19 0800) Weight:  [59.7 kg (131 lb 9.8 oz)] 59.7 kg (131 lb 9.8 oz) (02/19 0108)   General:  Ill appearing, young woman, ventilated.  Neuro:  Wakes to voice, opens eyes, does not follow commands, moves all ext HEENT: ETT ok, PERRL Cardiovascular: regular, 2/6 syst M, 2+ peripheral edema Lungs:  Coarse B BS Abdomen:  Soft, NT, + BS Musculoskeletal: no deformities Skin:  B LE and UE with small punctate excoriations and lesions.    Recent Labs Lab 05/20/16 0500 05/21/16 0353 05/22/16 0422  HGB 7.6* 7.7* 7.7*  HCT 24.9* 24.8* 24.9*  WBC 11.1* 9.6 9.3  PLT 214 166 154    Recent Labs Lab 05/17/16 0209  05/18/16 0938 05/18/16 1610 05/19/16 0900 05/20/16 0500 05/21/16 0353 05/22/16 0422  NA 133*  < > 136 136 137 137 137 139  K 5.5*  < > 5.1 5.3* 6.0* 5.3* 4.8 4.3  CL 108  < > 110 112* 113* 115* 116* 118*  CO2 20*  < > 18* 18* 16* 16* 15* 17*  GLUCOSE 191*  < > 90 98 126* 120* 130* 161*  BUN 27*  < > 40* 43* 52* 52* 51* 47*  CREATININE 1.25*  < > 2.05* 2.22* 2.87* 2.51* 2.29* 1.97*  CALCIUM 7.9*  < > 7.9* 7.6* 8.0* 7.6* 7.7* 7.7*  MG 1.6*  --  1.8  --   --  1.8 1.8  --   PHOS  --   --  9.2*  --   --  10.0*  --   --   < > = values in this interval not displayed.  Recent Labs Lab 05/15/16 1331 05/17/16 0225 05/17/16 0410  PHART 7.380 7.151*  --   PCO2ART 28.9* 55.9*  --   PO2ART 71.3* 441*  --   HCO3 16.6* 18.7* 17.7*  O2SAT 91.9 99.4 98.4    Recent Labs Lab 05/17/16 0209  AST 23  ALT 9*  ALKPHOS 112  BILITOT 1.1  PROT 8.4*  ALBUMIN  1.7*      MICROBIOLOGY: HIV 2/3:  Nonreactive Blood Cultures x2 2/3:  1/2 Bottles MRSA Urine Culture 2/3:  <10,000 Colonies insignificant growth Sputum Culture 2/4:  Moderate MSSA Sputum AFB Smear/Culture 2/4 >>>  Smear Negative / Culture pending Blood Culture x4 2/5:  4/4 Positive MRSA Sputum AFB Smear/Culture 2/7 >>> Smear Negative / Culture pending Blood Culture x1 2/7:  MRSA Influenza A/B PCR 2/8:  Negative   ANTIBIOTICS: Cefepime 2/3 (x1 dose) Vancomycin 2/3 - 2/8 Daptomycin 2/8 - 2/12 Zyvox 2/12 >>>  ASSESSMENT/PLAN:  31 y.o. female with history of IV drug use presenting with MRSA bacteremia . 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 Course  complicated by massive hemoptysis 2/14  for which she required intubation, xarelto stopped 2/12  PULMONARY A: Acute hypoxemic respiratory failure Hemoptysis >> bronch shows RLL source Pulmonary emboli P:   PRVC 8cc/kg High PSV today, will attempt to adjust sedation, determine whether she can extubate for success without trach VAP bundle Possibly restart anticoag this week. No further bleeding noted.    CARDIOVASCULAR A:  Hypotension - due to meds (versed)  & vent, resolved TV endocarditis with severe TR P:  Not a surgical candidate.  Poor long term abx candidate as well given hx but this would be her only therapeutic option.   RENAL A:   AKI - Low FeNa s/o prerenal but progressed to ATN, appears to have plateaued 2/16 Hyperkalemia Non-AG Metabolic acidosis P:  IVF d/c on 2/18, restart low dose lasix 2/19 Follow UOP and BMP Hold nephrotoxins   GASTROINTESTINAL A:   Doubt UGIB - feel aspirated blood P:   PPI as ordered NPO Continue TF  HEMATOLOGIC A:   ABLA + anemia of critical illness. Pulmonary embolism P:  Follow CBC Hold xarelto. Consider restart heparin this week    INFECTIOUS A:   Serratia and MRSA bacteremia secondary to IVDA TV endocarditis > progressed on TTE this admission MRSA bacteremia, MSSA multifocal cavitary pneumonia.  Leucocytosis P:   abx and cx data as above Continue linezolid (started 2/12)  Appreciate ID assistance   ENDOCRINE A:   Hypoglycmia P:   Follow CBG On TF  NEUROLOGIC A:   Acute metabolic encephalopahty Polysubstance abuse P:   RASS goal: 0 to -1 but have not achieved this.  On fentanyl + versed.  Will attempt a transition to higher dose precedex (she failed standard dose last week due to agitation)   Global: not a candidate for SNF placement as she has left SNF AMA. Palliative re-consulted 2/13. Dismal situation, as it is unclear that she will be able to comply with the med regimen required to have a chance to resolve her TV  endocarditis. See family discussion below   FAMILY  - Updates: Dr Delton Coombes spoke with pt's aunt and cousin at bedside 2/18, her closest family. Pt has not wanted her mom involved in her care. They understandably are exasperated with the situation since pt's drug habit has not allowed her to ever stay on abx, recover from the two underlying problems - endocarditis and IVDA. They are trying to factor her overall prognosis into our acute decision making regarding trach, next steps for this hospitalization. I believe that this is appropriate, and we will continue to seek their input. Her aunt does not believe that pt will every get clean - this influences every decision we make as we go forward. I hope that pt will extubate and that we can include her in these discussions as well, unclear that this is achievable without trach. Will continue to follow with family, plan to speak with her aunt again on 2/20   - Inter-disciplinary family meet or Palliative Care meeting due by: [facility] Medicine following, appreciate input.   Independent CC time 34 minutes.   Levy Pupa, MD, PhD 05/22/2016, 8:50  AM [facility] [phone] or if no answer [phone]

## 2016-05-22 NOTE — Progress Notes (Signed)
Daily Progress Note   Patient Name: Catherine Hernandez       Date: 05/22/2016 DOB: 1985/09/15  Age: 31 y.o. MRN#: [id number] Attending Physician: Marshell Garfinkel, MD Primary Care Physician: No PCP Per Patient Admit Date: 05/21/2016  Reason for Consultation/Follow-up: Establishing goals of care and Psychosocial/spiritual support  Subjective:  - spoke with Catherine Hernandez/pateitn's aunt and main support person at this time.  I have met Monnie in the past and Catherine Catalina is the only family member that has shown any interest or concern for Bryanah that I am  Aware of.  Doris has told me in the past that she does not want her mother involved in her life  -meeting is scheduled for tomorrow at 1:00 with CCM and myself and Jannifer Hick to discuss diagnosis, prognosis, GOCs and options     Questions and concerns addressed, emotional support offered   -Catherine Catalina is leaning to a shift focusing on comfort  Length of Stay: 16  Current Medications: Scheduled Meds:  . chlorhexidine gluconate (MEDLINE KIT)  15 mL Mouth Rinse BID  . Chlorhexidine Gluconate Cloth  6 each Topical Daily  . feeding supplement (NEPRO CARB STEADY)  1,000 mL Per Tube Q24H  . furosemide  40 mg Intravenous Daily  . linezolid (ZYVOX) IV  600 mg Intravenous Q12H  . mouth rinse  15 mL Mouth Rinse QID  . multivitamin  15 mL Per Tube Daily  . pantoprazole sodium  40 mg Per Tube Q1200  . sodium chloride flush  3 mL Intravenous Q12H    Continuous Infusions: . sodium chloride 50 mL/hr at 05/22/16 1400  . dexmedetomidine 1.2 mcg/kg/hr (05/22/16 1400)  . fentaNYL infusion INTRAVENOUS 125 mcg/hr (05/22/16 1412)  . midazolam (VERSED) infusion Stopped (05/22/16 1412)    PRN Meds: acetaminophen, bisacodyl, fentaNYL, guaiFENesin, lip balm, LORazepam,  midazolam, midazolam, ondansetron **OR** ondansetron (ZOFRAN) IV, sodium chloride, sodium chloride flush  Physical Exam  Constitutional: She appears cachectic. She appears ill. She is sedated and intubated.  Cardiovascular: Tachycardia present.   Pulmonary/Chest: She is intubated.  Neurological: Cranial nerve deficit:    Skin: Skin is warm and dry.            Vital Signs: BP (!) 89/44   Pulse 98   Temp 99.9 F (37.7 C) (Oral)   Resp 19  Ht '5\' 5"'$  (1.651 m)   Wt 59.7 kg (131 lb 9.8 oz)   SpO2 99%   BMI 21.90 kg/m  SpO2: SpO2: 99 % O2 Device: O2 Device: Ventilator O2 Flow Rate: O2 Flow Rate (L/min): 40 L/min  Intake/output summary:  Intake/Output Summary (Last 24 hours) at 05/22/16 1440 Last data filed at 05/22/16 1412  Gross per 24 hour  Intake           2759.5 ml  Output             2100 ml  Net            659.5 ml   LBM: Last BM Date: 05/22/16 Baseline Weight: Weight: 43.1 kg (95 lb) Most recent weight: Weight: 59.7 kg (131 lb 9.8 oz)         Flowsheet Rows   Flowsheet Row Most Recent Value  Intake Tab  Referral Department  Hospitalist  Unit at Time of Referral  Med/Surg Unit  Palliative Care Primary Diagnosis  Sepsis/Infectious Disease  Date Notified  05/10/16  Palliative Care Type  Return patient Palliative Care  Reason for referral  Clarify Goals of Care  Date of Admission  05/13/2016  Date first seen by Palliative Care  05/10/16  # of days Palliative referral response time  0 Day(s)  # of days IP prior to Palliative referral  4  Clinical Assessment  Palliative Performance Scale Score  40%  Pain Max last 24 hours  6  Pain Min Last 24 hours  5  Dyspnea Max Last 24 Hours  7  Dyspnea Min Last 24 hours  6  Nausea Max Last 24 Hours  1  Nausea Min Last 24 Hours  0  Anxiety Max Last 24 Hours  9  Anxiety Min Last 24 Hours  6  Psychosocial & Spiritual Assessment  Palliative Care Outcomes  Patient/Family meeting held?  Yes  Who was at the meeting?  patient  who is decisional.   Palliative Care Outcomes  Clarified goals of care      Patient Active Problem List   Diagnosis Date Noted  . ARF (acute renal failure) ([place])   . MRSA bacteremia 05/08/2016  . History of pulmonary embolism 05/04/2016  . Serratia infection 04/04/2016  . IVDU (intravenous drug user) 04/04/2016  . Tobacco abuse 04/03/2016  . Palliative care by specialist 03/24/2016  . Septic embolism ([place])   . HCAP (healthcare-associated pneumonia) 03/15/2016  . Symptomatic anemia 03/14/2016  . 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 Polysubstance abuse   . Enterococcal bacteremia 11/18/2015  . Staphylococcus aureus bacteremia 11/18/2015  . Normocytic anemia 11/18/2015  . Poor dentition 11/18/2015  . Protein-calorie malnutrition, severe ([place]) 11/18/2015  . Acute kidney injury ([place])   . Opioid use disorder, severe, dependence ([place]) 11/17/2015  . Hyponatremia 11/17/2015  . Thrombocytopenia ([place]) 11/17/2015  . Cavitary lung disease     Palliative Care Assessment & Plan   Patient Profile:  31 y.o. 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 Patient found to have septic emboli on CT angiogram in December and was started on Xarelto for systemic anticoagulation.  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 Over last 6 mnths, she has been bacteremic with MSSA, serratia & MRSA this time.  Currently intubated  Dense psychosocial issues  Assessment:  Family is  faced treatment option decisions and anticipatory care needs   Goals of Care and Additional Recommendations:  Limitations on Scope of Treatment: to be dicussed with family tomorrow afternoon  Code Status:    Code Status Orders        Start     Ordered   05/19/2016 1947  Full code  Continuous      05/31/2016 1952    Code Status History    Date Active Date Inactive Code Status Order ID Comments User Context   04/03/2016  2:22 AM 04/09/2016  6:41 PM Full Code [id number]  Ivor Costa, MD ED   03/20/2016 12:44 AM 03/28/2016  5:48 PM Full Code [id number]  Etta Quill, DO ED   03/15/2016  5:24 PM 03/15/2016 11:20 PM Full Code [id number]  Patrecia Pour, MD Inpatient   12/16/2015  9:06 AM 12/19/2015 11:22 PM Full Code [id number]  Waldemar Dickens, MD ED   11/17/2015  1:50 AM 12/02/2015  5:55 PM Full Code [id number]  Karmen Bongo, MD Inpatient       Prognosis:   Unable to determine  Discharge Planning:  To Be Determined  Care plan was discussed with bedside nurse, she is notifying Dr Lamonte Sakai of scheduled meeting  Thank you for allowing the Palliative Medicine Team to assist in the care of this patient.   Time In: 1410 Time Out: 1435 Total Time 25 min Prolonged Time Billed  no       Greater than 50%  of this time was spent counseling and coordinating care related to the above assessment and plan.  Wadie Lessen, NP  Please contact Palliative Medicine Team phone at [phone] for questions and concerns.

## 2016-05-22 NOTE — Progress Notes (Signed)
Nutrition Follow-up  DOCUMENTATION CODES:   Severe malnutrition in context of chronic illness, Underweight  INTERVENTION:  - Continue Nepro @ 35 mL/hr which is providing 1512 kcal, 68 grams of protein, 605 mg Phos, and 611 mL free water.  - Free water flush to be per MD/NP, if desired.  NUTRITION DIAGNOSIS:   Malnutrition related to chronic illness as evidenced by severe depletion of body fat, severe depletion of muscle mass. - ongoing  GOAL:   Patient will meet greater than or equal to 90% of their needs -met with current TF regimen.   MONITOR:   Vent status, TF tolerance, Weight trends, Labs, I & O's  ASSESSMENT:   31 y.o.femalewith medical history significant for IV drug abuse, endocarditis involving tricuspid valve, and nonadherence with her treatment plan, now presenting to the emergency department with generalized aches and pains, malaise, productive cough, and fatigue.   2/19 TF changed from [place] 1.2 to Nepro by this RD during last assessment d/t worsening renal panel with hyperkalemia and hyperphosphatemia. Pt continues with TF at goal: Nepro @ 35 mL/hr per OGT. Per rounds this AM, Palliative to review pt's case again. Weight significantly up since last assessment (+7.6 kg) and since admission (+16.6 kg); continue to use admission weight in re-estimating kcal need this AM.  Patient is currently intubated on ventilator support MV: 11.7 L/min Temp (24hrs), Avg:98.4 F (36.9 C), Min:97.2 F (36.2 C), Max:99.5 F (37.5 C)  Medications reviewed; PRN Dulcolax, 40 mg IV Lasix/day, 15 mL daily liquid multivitamin, PRN Zofran, 40 mg Protonix per OGT/day.  Labs reviewed;CBGs: 102-115 mg/dL this AM, Cl: 118 mmol/L, BUN: 47 mg/dL, creatinine: 1.97 mg/dL, Ca: 7.7 mg/dL, GFR: 33 mL/min. Mg was WDL on 2/17 and 2/18, no Phos since 2/17 when it was 10 mg/dL.  IVF: NS @ 50 mL/hr. Drips: Precedex @ 0.8 mcg/kg/hr, Fentanyl @ 150 mcg/hr, Versed @ 2 mg/hr.     2/16 - RN reports  failed attempt to wean from vent this AM d/t severe agitation.  - TF was held when pt was weaning d/t concern that she would pull OGT.  - Prior to hold, pt was receiving Jevity 1.2 @ 40 mL/hr with 30 mL Prostat/day which provides 1252 kcal (83% re-estimated kcal need), 68 grams of protein, and 775 mL free water.  - TF has now been re-started @ 40 mL/hr and plan to increase to goal rate of Jevity 1.2 @ 50 mL/hr (without Prostat) at 1300.  - This regimen will provide 1440 kcal (95% re-estimated kcal need), 67 grams of protein, and 968 mL free water.  - Noted no BM since 2/12 documented.  - Continue to use admission weight (43.1 kg) in estimating needs as weight was stable 2/3-2/10 and has been trending up since transfer to ICU and need for IVF.   Patient is currently intubated on ventilator support MV: 12.8 L/min Temp (24hrs), Avg:98.3 F (36.8 C), Min:97.9 F (36.6 C), Max:99 F (37.2 C) BP: 120/66 and MAP: 84. PRN Dulcolax. K: 6 mmol/L, Phos: 9.2 mg/dL.  IVF: NS @ 125 mL/hr.  Drips: Fentanyl @ 125 mcg/hr, Versed @ 2.5 mg/hr.      2/15 - Spoke with RN about plan for TF.  - Weight +2 kg from yesterday; will continue to use admission weight in estimating needs. - In rounds RN reported minimal drainage from OGT which is mostly bile-like but that when suctioned pt outputs large amount of bloody drainage (600cc last night per RN).  - Will order TF  with slow advancement d/t concern for hemodynamic stability and possible electrolyte shift if TF is initiated at goal rate.  - Will adjust goal rate/regimen if D5 continues to be needed once TF approaches goal rate.  Patient is currently intubated on ventilator support MV: 11.3L/min Temp (24hrs), Avg:98.4 F (36.9 C), Min:97.6 F (36.4 C), Max:99.2 F (37.3 C) BP:98/52 and MAP: 68 IVF: D5-NS @ 75 mL/hr (306 kcal).  Drips:Versed @ 3 mg/hr, Fentanyl @ 200 mcg/hr, Neo @ 5 mcg/min.     Diet Order:  Diet NPO time specified  Skin:   Reviewed, no issues  Last BM:  2/19  Height:   Ht Readings from Last 1 Encounters:  05/18/16 _0  (1.651 m)    Weight:   Wt Readings from Last 1 Encounters:  05/22/16 131 lb 9.8 oz (59.7 kg)    Ideal Body Weight:  56.8 kg  BMI:  Body mass index is 21.9 kg/m.  Estimated Nutritional Needs:   Kcal:  1530  Protein:  65-73 grams (1.5-1.7 grams/kg)  Fluid:  1.5-1.7L/day  EDUCATION NEEDS:   No education needs identified at this time    Jarome Matin, MS, RD, LDN, CNSC Inpatient Clinical Dietitian Pager # [phone] After hours/weekend pager # [phone]

## 2016-05-22 NOTE — Progress Notes (Signed)
eLink Physician-Brief Progress Note Patient Name: Catherine Hernandez DOB: [birth year] MRN: [id number]   Date of Service  05/22/2016  HPI/Events of Note  Nurse report blood from patient's rectum and requests that stool be sent for occult blood. Patient is 31 yo female. Please be sure that this isn't the patient's period.  eICU Interventions  Will order: 1. Stool for occult blood.      Intervention Category Major Interventions: Other:  Lenell AntuSommer,Jazmyne Beauchesne Eugene 05/22/2016, 3:24 PM

## 2016-05-22 NOTE — Progress Notes (Signed)
15cc of versed wasted in sink with Effie BerkshireAlex Kroliczak, RN.

## 2016-05-22 NOTE — Progress Notes (Signed)
Date:  May 22, 2016 Chart reviewed for concurrent status and case management needs.  Remains on full vent support. Will continue to follow patient progress. Discharge Planning: following for needs Expected discharge date: [id number] Marcelle SmilingRhonda Davis, BSN, [place]RN3, [place]CCM   [phone]

## 2016-05-22 NOTE — Progress Notes (Signed)
Patient ID: Catherine Hernandez, female   DOB: 1985-05-12, 31 y.o.   MRN: [id number]         [facility]  Date of Admission:  05/19/2016    Total days of antibiotics 17        Day 8 linezolid          Principal Problem:   Endocarditis of tricuspid valve Active Problems:   HCAP (healthcare-associated pneumonia)   MRSA bacteremia   Enterococcal bacteremia   Staphylococcus aureus bacteremia   Serratia infection   Polysubstance abuse   IVDU (intravenous drug user)   Opioid use disorder, severe, dependence (HCC)   Hyponatremia   Thrombocytopenia ([place])   Cavitary lung disease   Poor dentition   Acute kidney injury ([place])   Protein-calorie malnutrition, severe (HCC)   Symptomatic anemia   Septic embolism (HCC)   Tobacco abuse   History of pulmonary embolism   ARF (acute renal failure) ([place])   . chlorhexidine gluconate (MEDLINE KIT)  15 mL Mouth Rinse BID  . Chlorhexidine Gluconate Cloth  6 each Topical Daily  . feeding supplement (NEPRO CARB STEADY)  1,000 mL Per Tube Q24H  . furosemide  40 mg Intravenous Daily  . linezolid (ZYVOX) IV  600 mg Intravenous Q12H  . mouth rinse  15 mL Mouth Rinse QID  . multivitamin  15 mL Per Tube Daily  . pantoprazole sodium  40 mg Per Tube Q1200  . sodium chloride flush  3 mL Intravenous Q12H   Review of Systems: Review of Systems  Unable to perform ROS: Intubated    Past Medical History:  Diagnosis Date  . Anemia   . CAP (community acquired pneumonia) 11/16/2015  . Childhood asthma   . Endocarditis   . Enterococcal bacteremia   . Heroin abuse   . Hyponatremia   . Pneumonia 2007  . Polysubstance abuse     Social History  Substance Use Topics  . Smoking status: Current Every Day Smoker    Packs/day: 0.50    Years: 10.00    Types: Cigarettes  . Smokeless tobacco: Never Used  . Alcohol use No    Family History  Problem Relation Age of Onset  . Anesthesia problems Neg Hx   . Hypotension Neg Hx   .  Malignant hyperthermia Neg Hx   . Pseudochol deficiency Neg Hx    No Known Allergies  OBJECTIVE: Vitals:   05/22/16 1200 05/22/16 1300 05/22/16 1312 05/22/16 1400  BP: 116/73 (!) 95/52  (!) 89/44  Pulse: (!) 113 (!) 103  98  Resp: (!) 25 (!) 21 (!) 40 19  Temp: 99.9 F (37.7 C)     TempSrc: Oral     SpO2: 99% 99%  99%  Weight:      Height:       Body mass index is 21.9 kg/m.  Physical Exam  Constitutional:  She remains sedated on the ventilator.  Cardiovascular: Regular rhythm.   No murmur heard. Tachycardic.  Pulmonary/Chest:  Coarse rhonchi.    Lab Results Lab Results  Component Value Date   WBC 9.3 05/22/2016   HGB 7.7 (L) 05/22/2016   HCT 24.9 (L) 05/22/2016   MCV 90.2 05/22/2016   PLT 154 05/22/2016    Lab Results  Component Value Date   CREATININE 1.97 (H) 05/22/2016   BUN 47 (H) 05/22/2016   NA 139 05/22/2016   K 4.3 05/22/2016   CL 118 (H) 05/22/2016   CO2 17 (L) 05/22/2016  Lab Results  Component Value Date   ALT 9 (L) 05/17/2016   AST 23 05/17/2016   ALKPHOS 112 05/17/2016   BILITOT 1.1 05/17/2016     Microbiology: Recent Results (from the past 240 hour(s))  Culture, blood (routine x 2)     Status: None   Collection Time: 05/13/16 10:46 AM  Result Value Ref Range Status   Specimen Description BLOOD LEFT ARM  Final   Special Requests IN PEDIATRIC BOTTLE 1CC  Final   Culture   Final    NO GROWTH 5 DAYS Performed at [facility], [place] [address] [place], [place] [address]    Report Status 05/18/2016 FINAL  Final  Culture, blood (routine x 2)     Status: None   Collection Time: 05/13/16 10:49 AM  Result Value Ref Range Status   Specimen Description BLOOD RIGHT HAND  Final   Special Requests IN PEDIATRIC BOTTLE 3CC  Final   Culture   Final    NO GROWTH 5 DAYS Performed at [facility], [place] [address], [place], [place] [address]    Report Status 05/18/2016 FINAL  Final  Acid Fast Smear (AFB)     Status: None    Collection Time: 05/17/16  4:01 AM  Result Value Ref Range Status   AFB Specimen Processing Concentration  Final   Acid Fast Smear Negative  Final    Comment: (NOTE) Performed At: [facility] [address] [place], [place] [id number] Lindon Romp MD WL:[id number]    Source (AFB) BRONCHIAL WASHINGS  Final    Comment: RIGHT  Culture, respiratory (NON-Expectorated)     Status: None   Collection Time: 05/17/16  4:01 AM  Result Value Ref Range Status   Specimen Description BRONCHIAL WASHINGS RIGHT  Final   Special Requests NONE  Final   Gram Stain   Final    FEW WBC PRESENT, PREDOMINANTLY PMN FEW GRAM POSITIVE COCCI IN PAIRS Performed at [facility], [address], [place], [place] [address]    Culture   Final    MODERATE METHICILLIN RESISTANT STAPHYLOCOCCUS AUREUS   Report Status 05/19/2016 FINAL  Final   Organism ID, Bacteria METHICILLIN RESISTANT STAPHYLOCOCCUS AUREUS  Final      Susceptibility   Methicillin resistant staphylococcus aureus - MIC*    CIPROFLOXACIN <=0.5 SENSITIVE Sensitive     ERYTHROMYCIN >=8 RESISTANT Resistant     GENTAMICIN <=0.5 SENSITIVE Sensitive     OXACILLIN >=4 RESISTANT Resistant     TETRACYCLINE >=16 RESISTANT Resistant     VANCOMYCIN 1 SENSITIVE Sensitive     TRIMETH/SULFA <=10 SENSITIVE Sensitive     CLINDAMYCIN >=8 RESISTANT Resistant     RIFAMPIN <=0.5 SENSITIVE Sensitive     Inducible Clindamycin NEGATIVE Sensitive     * MODERATE METHICILLIN RESISTANT STAPHYLOCOCCUS AUREUS    ASSESSMENT: 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 Her chest x-ray shows increased right base density over the past week.  PLAN: 1. Continue linezolid  Michel Bickers, MD [facility] [phone] pager   [phone] cell 05/22/2016, 2:48 PM

## 2016-05-22 NOTE — Progress Notes (Signed)
eLink Physician-Brief Progress Note Patient Name: Catherine Hernandez DOB: 1985-11-30 MRN: [id number]   Date of Service  05/22/2016  HPI/Events of Note  No am labs  eICU Interventions  Ordered cbc, bmet     Intervention Category Minor Interventions: Routine modifications to care plan (e.g. PRN medications for pain, fever)  Max FickleDouglas Samiyah Stupka 05/22/2016, 3:47 AM

## 2016-05-23 ENCOUNTER — Inpatient Hospital Stay (HOSPITAL_COMMUNITY): Payer: Medicaid Other

## 2016-05-23 DIAGNOSIS — Z66 Do not resuscitate: Secondary | ICD-10-CM

## 2016-05-23 DIAGNOSIS — J9600 Acute respiratory failure, unspecified whether with hypoxia or hypercapnia: Secondary | ICD-10-CM

## 2016-05-23 LAB — BASIC METABOLIC PANEL
Anion gap: 5 (ref 5–15)
BUN: 46 mg/dL — AB (ref 6–20)
CALCIUM: 7.5 mg/dL — AB (ref 8.9–10.3)
CHLORIDE: 120 mmol/L — AB (ref 101–111)
CO2: 17 mmol/L — AB (ref 22–32)
CREATININE: 1.65 mg/dL — AB (ref 0.44–1.00)
GFR calc non Af Amer: 41 mL/min — ABNORMAL LOW (ref >60)
GFR, EST AFRICAN AMERICAN: 48 mL/min — AB (ref >60)
Glucose, Bld: 135 mg/dL — ABNORMAL HIGH (ref 65–99)
Potassium: 4.1 mmol/L (ref 3.5–5.1)
SODIUM: 142 mmol/L (ref 135–145)

## 2016-05-23 LAB — CBC
HEMATOCRIT: 22.4 % — AB (ref 36.0–46.0)
HEMATOCRIT: 25.5 % — AB (ref 36.0–46.0)
HEMOGLOBIN: 6.9 g/dL — AB (ref 12.0–15.0)
Hemoglobin: 8 g/dL — ABNORMAL LOW (ref 12.0–15.0)
MCH: 27.9 pg (ref 26.0–34.0)
MCH: 27.4 pg (ref 26.0–34.0)
MCHC: 30.8 g/dL (ref 30.0–36.0)
MCHC: 31.4 g/dL (ref 30.0–36.0)
MCV: 90.7 fL (ref 78.0–100.0)
MCV: 87.3 fL (ref 78.0–100.0)
PLATELETS: 123 10*3/uL — AB (ref 150–400)
Platelets: 128 10*3/uL — ABNORMAL LOW (ref 150–400)
RBC: 2.47 MIL/uL — ABNORMAL LOW (ref 3.87–5.11)
RBC: 2.92 MIL/uL — ABNORMAL LOW (ref 3.87–5.11)
RDW: 19.4 % — AB (ref 11.5–15.5)
RDW: 18.2 % — AB (ref 11.5–15.5)
WBC: 6.4 10*3/uL (ref 4.0–10.5)
WBC: 6.8 10*3/uL (ref 4.0–10.5)

## 2016-05-23 LAB — GLUCOSE, CAPILLARY
GLUCOSE-CAPILLARY: 128 mg/dL — AB (ref 65–99)
GLUCOSE-CAPILLARY: 130 mg/dL — AB (ref 65–99)
GLUCOSE-CAPILLARY: 119 mg/dL — AB (ref 65–99)
GLUCOSE-CAPILLARY: 159 mg/dL — AB (ref 65–99)
GLUCOSE-CAPILLARY: 117 mg/dL — AB (ref 65–99)
Glucose-Capillary: 110 mg/dL — ABNORMAL HIGH (ref 65–99)
Glucose-Capillary: 143 mg/dL — ABNORMAL HIGH (ref 65–99)

## 2016-05-23 LAB — MAGNESIUM: Magnesium: 2 mg/dL (ref 1.7–2.4)

## 2016-05-23 LAB — PREPARE RBC (CROSSMATCH)

## 2016-05-23 LAB — PHOSPHORUS: Phosphorus: 5.2 mg/dL — ABNORMAL HIGH (ref 2.5–4.6)

## 2016-05-23 MED ORDER — SODIUM CHLORIDE 0.9 % IV SOLN: Freq: Once | INTRAVENOUS | Status: DC

## 2016-05-23 MED ORDER — FUROSEMIDE 10 MG/ML IJ SOLN
60.0000 mg | Freq: Every day | INTRAMUSCULAR | Status: DC
  Administered 2016-05-24 – 2016-05-25 (×2): 60 mg via INTRAVENOUS
  Filled 2016-05-23 (×2): qty 6

## 2016-05-23 NOTE — Progress Notes (Signed)
CRITICAL VALUE ALERT  Critical value received:  Hbg 6.9  Date of notification:  05/23/2016  Time of notification:  0505  Critical value read back:Yes.    Nurse who received alert:  Loletta ParishJessica Zissy Hamlett RN   MD notified (1st page):  Dr. Isaiah SergeMannam  Time of first page:  0515  Responding MD:  Dr. Isaiah SergeMannam  Time MD responded:  [phone]

## 2016-05-23 NOTE — Progress Notes (Signed)
eLink Physician-Brief Progress Note Patient Name: Catherine Hernandez DOB: [birth year] MRN: [id number]   Date of Service  05/23/2016  HPI/Events of Note  Hb 6.9 No evidence of active bleed from ETT Hemoccult stool is negative This is likely anemia of chronic illness  eICU Interventions  Transfuse 1 unit PRBC     Intervention Category Intermediate Interventions: Other:  Mamta Rimmer 05/23/2016, 5:18 AM

## 2016-05-23 NOTE — Progress Notes (Signed)
Pt Catherine PercyAunt [name] and uncle at bedside, notified Brandy NP with CCMD.

## 2016-05-23 NOTE — Progress Notes (Signed)
Daily Progress Note   Patient Name: Catherine Hernandez       Date: 05/23/2016 DOB: Aug 19, 1985  Age: 31 y.o. MRN#: [id number] Attending Physician: Marshell Garfinkel, MD Primary Care Physician: No PCP Per Patient Admit Date: 05/21/2016  Reason for Consultation/Follow-up: Establishing goals of care and Psychosocial/spiritual support  Subjective:  - met with Catherine Hernandez/patient's Aunt/ main support person at this time,  and Catherine Hernandez's husband Catherine Hernandez.   Today we discussed  diagnosis, prognosis, GOC, EOL wishes disposition and options.  -Catherine Hernandez believes and is supported by medical team that goal is to give Catherine Hernandez the best chance of successful extubation, then at the time no further intubation is desired.  No escalation of care    Comfort and dignity are the focus of care  -The difference between a aggressive medical intervention path  and a palliative comfort care path for this patient at this time was had.  Values and goals of care important to patient and family were attempted to be elicited.  - Concept of Hospice facility for EOL care was introduce   Questions and concerns addressed.   Family encouraged to call with questions or concerns.  PMT will continue to support holistically.   Length of Stay: 17  Current Medications: Scheduled Meds:  . sodium chloride   Intravenous Once  . chlorhexidine gluconate (MEDLINE KIT)  15 mL Mouth Rinse BID  . Chlorhexidine Gluconate Cloth  6 each Topical Daily  . feeding supplement (NEPRO CARB STEADY)  1,000 mL Per Tube Q24H  . [START ON 05/24/2016] furosemide  60 mg Intravenous Daily  . linezolid (ZYVOX) IV  600 mg Intravenous Q12H  . mouth rinse  15 mL Mouth Rinse QID  . multivitamin  15 mL Per Tube Daily  . pantoprazole sodium  40 mg Per Tube Q1200  . sodium  chloride flush  3 mL Intravenous Q12H    Continuous Infusions: . dexmedetomidine 1.501 mcg/kg/hr (05/23/16 1200)  . fentaNYL infusion INTRAVENOUS 100 mcg/hr (05/23/16 1200)    PRN Meds: acetaminophen, bisacodyl, fentaNYL, guaiFENesin, lip balm, ondansetron **OR** ondansetron (ZOFRAN) IV, sodium chloride flush  Physical Exam  Constitutional: She appears cachectic. She appears ill. She is sedated and intubated.  HENT:  Mouth/Throat: Abnormal dentition. Dental caries present.  Cardiovascular: Tachycardia present.   Pulmonary/Chest: She is intubated.  Neurological: Cranial nerve  deficit:    Skin: Skin is warm and dry.            Vital Signs: BP 105/76   Pulse 97   Temp 98.7 F (37.1 C) (Oral)   Resp (!) 35   Ht '5\' 5"'$  (1.651 m)   Wt 59.7 kg (131 lb 9.8 oz)   SpO2 98%   BMI 21.90 kg/m  SpO2: SpO2: 98 % O2 Device: O2 Device: Ventilator O2 Flow Rate: O2 Flow Rate (L/min): 40 L/min  Intake/output summary:   Intake/Output Summary (Last 24 hours) at 05/23/16 1341 Last data filed at 05/23/16 1212  Gross per 24 hour  Intake          3929.33 ml  Output             1670 ml  Net          2259.33 ml   LBM: Last BM Date: 05/23/16 Baseline Weight: Weight: 43.1 kg (95 lb) Most recent weight: Weight: 59.7 kg (131 lb 9.8 oz)         Flowsheet Rows   Flowsheet Row Most Recent Value  Intake Tab  Referral Department  Hospitalist  Unit at Time of Referral  Med/Surg Unit  Palliative Care Primary Diagnosis  Sepsis/Infectious Disease  Date Notified  05/10/16  Palliative Care Type  Return patient Palliative Care  Reason for referral  Clarify Goals of Care  Date of Admission  05/09/2016  Date first seen by Palliative Care  05/10/16  # of days Palliative referral response time  0 Day(s)  # of days IP prior to Palliative referral  4  Clinical Assessment  Palliative Performance Scale Score  40%  Pain Max last 24 hours  6  Pain Min Last 24 hours  5  Dyspnea Max Last 24 Hours  7    Dyspnea Min Last 24 hours  6  Nausea Max Last 24 Hours  1  Nausea Min Last 24 Hours  0  Anxiety Max Last 24 Hours  9  Anxiety Min Last 24 Hours  6  Psychosocial & Spiritual Assessment  Palliative Care Outcomes  Patient/Family meeting held?  Yes  Who was at the meeting?  patient who is decisional.   Palliative Care Outcomes  Clarified goals of care      Patient Active Problem List   Diagnosis Date Noted  . Acute respiratory failure ([employer])   . ARF (acute renal failure) ([place])   . MRSA bacteremia 05/08/2016  . History of pulmonary embolism 05/09/2016  . Serratia infection 04/04/2016  . IVDU (intravenous drug user) 04/04/2016  . Tobacco abuse 04/03/2016  . Palliative care by specialist 03/24/2016  . Septic embolism ([place])   . HCAP (healthcare-associated pneumonia) 03/15/2016  . Symptomatic anemia 03/14/2016  . 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 Polysubstance abuse   . Enterococcal bacteremia 11/18/2015  . Staphylococcus aureus bacteremia 11/18/2015  . Normocytic anemia 11/18/2015  . Poor dentition 11/18/2015  . Protein-calorie malnutrition, severe ([place]) 11/18/2015  . Acute kidney injury ([place])   . Opioid use disorder, severe, dependence ([place]) 11/17/2015  . Hyponatremia 11/17/2015  . Thrombocytopenia ([place]) 11/17/2015  . Cavitary lung disease     Palliative Care Assessment & Plan   Patient Profile:  31 y.o. 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 Patient found to have septic emboli on CT angiogram in  December and was started on Xarelto for systemic anticoagulation.  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 Over last 6 mnths, she has been bacteremic with MSSA, serratia & MRSA this time.  Currently intubated  Dense psychosocial issues   Family  face advanced directive decisions and anticipatory care needs  Assessment:  Family is faced treatment option decisions and anticipatory care needs    Code Status:  DNR-documented today- once liberated from the vent no re-intubation      Code Status Orders        Start     Ordered   05/25/2016 1947  Full code  Continuous     05/22/2016 1952    Code Status History    Date Active Date Inactive Code Status Order ID Comments User Context   04/03/2016  2:22 AM 04/09/2016  6:41 PM Full Code [id number]  Ivor Costa, MD ED   03/20/2016 12:44 AM 03/28/2016  5:48 PM Full Code [id number]  Etta Quill, DO ED   03/15/2016  5:24 PM 03/15/2016 11:20 PM Full Code [id number]  Patrecia Pour, MD Inpatient   12/16/2015  9:06 AM 12/19/2015 11:22 PM Full Code [id number]  Waldemar Dickens, MD ED   11/17/2015  1:50 AM 12/02/2015  5:55 PM Full Code [id number]  Karmen Bongo, MD Inpatient       Prognosis:   Unable to determine  Discharge Planning:  To Be Determined  Care plan was discussed with bedside nurse and Noe Gens NP CCM   Thank you for allowing the Palliative Medicine Team to assist in the care of this patient. Will continue to support holistically   Time In: 1300 Time Out: 1335 Total Time 35 min Prolonged Time Billed  no       Greater than 50%  of this time was spent counseling and coordinating care related to the above assessment and plan.  Wadie Lessen, NP  Please contact Palliative Medicine Team phone at [phone] for questions and concerns.

## 2016-05-23 NOTE — Progress Notes (Signed)
Patient ID: Catherine Hernandez, female   DOB: [birth year], 31 y.o.   MRN: [id number]          [facility]    Date of Admission:  05/26/2016    Total days of antibiotics 18        Day 9 linezolid  I reviewed the palliative care team no today and discussed the situation with Catherine Hernandez's nurse. The plan is to attempt extubation and 48 hours. She is DO NOT RESUSCITATE status and the plan is to not reintubate if she has worsening respiratory status but simply focus on comfort care. I will continue linezolid for now.         Cliffton AstersJohn Daquana Paddock, MD [facility] [phone] pager   [phone] cell 05/23/2016, 4:31 PM

## 2016-05-23 NOTE — Progress Notes (Signed)
[facility] Attending Note  Presenting HPI:  31 y.o. 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 Patient found to have septic emboli on CT angiogram in December and was started on Xarelto for systemic anticoagulation.  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 Over last 6 mnths, she has been bacteremic with MSSA, serratia & MRSA this time  Lines/ Tubes ETT 2/14 >> RIJ 2/14 >>  IMAGING/STUDIES: CTA CHEST 03/22/16:  Per radiologist. Bilateral lower lobe pulmonary emboli with taper. Since which can be seen with chronic pulmonary emboli or possible vasculopathy secondary to septic emboli. Multiple cavitating and non-cavitating pneumonia/consolidation of varying ages consistent with septic emboli. Trace pleural effusions. Mild cardiomegaly with dilated right atrium. Suspect splenomegaly.  VENOUS DUPLEX BILATERAL LOWER EXTREMITIES 03/23/16: No evidence of DVT or SVT involving right or left lower extremity.  CT CHEST W/O 2016-05-20:  Previously reviewed by me. No pleural effusion or thickening. No pericardial effusion. Multiple cavitary nodules and nodular opacities throughout all lung zones. Precarinal lymphadenopathy with significant enlargement up to 1.5 cm in short axis by my review.  TTE 05/08/16:  LV normal in size with EF 60-65% & no regional wall motion abnormalities. LA normal in size. RA severely dilated. RV moderately dilated with preserved systolic function. No aortic stenosis or regurgitation. Aortic root normal in size. No mitral stenosis or regurgitation. No pulmonic stenosis or regurgitation. Severe tricuspid regurgitation. No pericardial effusion.  Significant events  2/14   At approximately 2am pt expeirenced coughing spell which  progressed into massive hemoptysis requiring emergent intubation by anesthesia. bronch >> RLL bleeding  SUBJ -  Changed to precedex on 2/19, versed is off Fentanyl at 150 Received 1u PRBC last pm Currently on PSV 20  Temp:  [98.9 F (37.2 C)-100.2 F (37.9 C)] 98.9 F (37.2 C) (02/20 0343) Pulse Rate:  [91-130] 91 (02/20 0800) Resp:  [19-42] 23 (02/20 0800) BP: (89-145)/(44-91) 109/75 (02/20 0800) SpO2:  [97 %-100 %] 98 % (02/20 0800) FiO2 (%):  [30 %] 30 % (02/20 0800) Weight:  [59.7 kg (131 lb 9.8 oz)] 59.7 kg (131 lb 9.8 oz) (02/20 0443)   General:  Ill appearing woman, intubated Neuro:  Calm, opened eyes to voice and followed commands HEENT: ETT ok, PERRL Cardiovascular: regular, soft systolic M Lungs:  Clear B  Abdomen:  Soft, NT, + BS Musculoskeletal: no deformities Skin: B skin lesions UE and LE, punctate healing scabs   Recent Labs Lab 05/21/16 0353 05/22/16 0422 05/23/16 0442  HGB 7.7* 7.7* 6.9*  HCT 24.8* 24.9* 22.4*  WBC 9.6 9.3 6.4  PLT 166 154 128*    Recent Labs Lab 05/17/16 0209  05/18/16 0938  05/19/16 0900 05/20/16 0500 05/21/16 0353 05/22/16 0422 05/23/16 0442  NA 133*  < > 136  < > 137 137 137 139 142  K 5.5*  < > 5.1  < > 6.0* 5.3* 4.8 4.3 4.1  CL 108  < > 110  < > 113* 115* 116* 118* 120*  CO2 20*  < > 18*  < > 16* 16* 15* 17* 17*  GLUCOSE 191*  < > 90  < > 126* 120* 130* 161* 135*  BUN 27*  < > 40*  < > 52* 52* 51* 47* 46*  CREATININE 1.25*  < > 2.05*  < >  2.87* 2.51* 2.29* 1.97* 1.65*  CALCIUM 7.9*  < > 7.9*  < > 8.0* 7.6* 7.7* 7.7* 7.5*  MG 1.6*  --  1.8  --   --  1.8 1.8  --  2.0  PHOS  --   --  9.2*  --   --  10.0*  --   --  5.2*  < > = values in this interval not displayed.  Recent Labs Lab 05/17/16 0225 05/17/16 0410  PHART 7.151*  --   PCO2ART 55.9*  --   PO2ART 441*  --   HCO3 18.7* 17.7*  O2SAT 99.4 98.4    Recent Labs Lab 05/17/16 0209  AST 23  ALT 9*  ALKPHOS 112  BILITOT 1.1  PROT 8.4*  ALBUMIN 1.7*       MICROBIOLOGY: HIV 2/3:  Nonreactive Blood Cultures x2 2/3:  1/2 Bottles MRSA Urine Culture 2/3:  <10,000 Colonies insignificant growth Sputum Culture 2/4:  Moderate MSSA Sputum AFB Smear/Culture 2/4 >>>  Smear Negative / Culture pending Blood Culture x4 2/5:  4/4 Positive MRSA Sputum AFB Smear/Culture 2/7 >>> Smear Negative / Culture pending Blood Culture x1 2/7:  MRSA Influenza A/B PCR 2/8:  Negative   ANTIBIOTICS: Cefepime 2/3 (x1 dose) Vancomycin 2/3 - 2/8 Daptomycin 2/8 - 2/12 Zyvox 2/12 >>>  ASSESSMENT/PLAN:  31 y.o. female with history of IV drug use presenting with MRSA bacteremia . 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 Course  complicated by massive hemoptysis 2/14  for which she required intubation, eliquis stopped 2/12  PULMONARY A: Acute hypoxemic respiratory failure Hemoptysis >> bronch shows RLL source Pulmonary emboli P:   Continue PRVC 8cc/kg Wean PS as tolerated, progress today with improved MS; ? whether we can push to extubate for success vs pursue trach VAP prevention bundle Hold off on restarting anticoag for now, note Hgb drifting down   CARDIOVASCULAR A:  Hypotension - due to meds (versed)  & vent, resolved TV endocarditis with severe TR P:  Not a candidate for valve sgy Poor long term abx candidate also, but this is her only therapeutic option.   RENAL A:   AKI - Low FeNa s/o prerenal but progressed to ATN, appears to have plateaued 2/16, improving daily Hyperkalemia, resolved Non-AG Metabolic acidosis, suspect component hyperchloremia P:  Stop IVF Continue low dose lasix Follow UOP and BMP Hold nephrotoxins   GASTROINTESTINAL A:   Anemia of critical illness Doubt UGIB - feel aspirated blood P:   S/p 1u PRBC last night PPI qd Follow CBC NPO, on TF's and tolerating  HEMATOLOGIC A:   ABLA +  anemia of critical illness. Pulmonary embolism P:  S/p 1u PRBC 2/20 am Follow CBC Holding eliquis, consider restart heparin this week if Hgb stabilizes  INFECTIOUS A:   Serratia and MRSA bacteremia secondary to IVDA TV endocarditis > progressed on TTE this admission MRSA bacteremia, MSSA multifocal cavitary pneumonia.  Leucocytosis P:   linezolid (started 2/12)  Follow cx data, fever curve, WBC Appreciate ID assistance  ENDOCRINE A:   Hypoglycemia, resolved P:   Following CBG's  NEUROLOGIC A:   Acute metabolic encephalopahty Polysubstance abuse P:   RASS goal: -1 Tolerating precedex 1.4 + fentanyl, attempt to wean the fentanyl slowly   Global: not a candidate for SNF placement as she has left SNF AMA. Palliative re-consulted 2/13, planning to meet with her aunt  today 2/20. There has been some progress made w regard to acute situation, but long term therapy and prognosis remains very poor. Pt's aunt is aware (see prior family mtg discussion below). I think an extubation for possible success, acknowledging that if she were to fail that we would not reintubate, make her comfortable, is reasonable. At every juncture as we progress (determining anticoag, determining duration and delivery of abx, etc) we will have to judge escalation of her care against the risk / chances for a relapse of her IVDA.    FAMILY  - Updates: Dr Delton Coombes spoke with pt's aunt and cousin at bedside 2/18, her closest family. Pt has not wanted her mom involved in her care. They understandably are exasperated with the situation since pt's drug habit has not allowed her to ever stay on abx, recover from the two underlying problems - endocarditis and IVDA. They are trying to factor her overall prognosis into our acute decision making regarding trach, next steps for this hospitalization. I believe that this is appropriate, and we will continue to seek their input. Her aunt does not believe that pt will every get clean  - this influences every decision we make as we go forward. I hope that pt will extubate and that we can include her in these discussions as well, unclear that this is achievable without trach. Will plan to discuss further with pt's aunt 2/20. She will also meet with Palliative Care today 2/20  - Inter-disciplinary family meet or Palliative Care meeting due by: [facility] Medicine following, appreciate input.   Independent CC time 36 minutes  Levy Pupa, MD, PhD 05/23/2016, 8:59 AM [facility] [phone] or if no answer [phone]

## 2016-05-24 DIAGNOSIS — J9621 Acute and chronic respiratory failure with hypoxia: Secondary | ICD-10-CM

## 2016-05-24 LAB — CBC
HCT: 28.9 % — ABNORMAL LOW (ref 36.0–46.0)
Hemoglobin: 9.1 g/dL — ABNORMAL LOW (ref 12.0–15.0)
MCH: 28.3 pg (ref 26.0–34.0)
MCHC: 31.5 g/dL (ref 30.0–36.0)
MCV: 90 fL (ref 78.0–100.0)
PLATELETS: 129 10*3/uL — AB (ref 150–400)
RBC: 3.21 MIL/uL — ABNORMAL LOW (ref 3.87–5.11)
RDW: 18.6 % — AB (ref 11.5–15.5)
WBC: 12.9 10*3/uL — AB (ref 4.0–10.5)

## 2016-05-24 LAB — GLUCOSE, CAPILLARY
GLUCOSE-CAPILLARY: 171 mg/dL — AB (ref 65–99)
GLUCOSE-CAPILLARY: 113 mg/dL — AB (ref 65–99)
Glucose-Capillary: 121 mg/dL — ABNORMAL HIGH (ref 65–99)
Glucose-Capillary: 129 mg/dL — ABNORMAL HIGH (ref 65–99)
Glucose-Capillary: 134 mg/dL — ABNORMAL HIGH (ref 65–99)
Glucose-Capillary: 138 mg/dL — ABNORMAL HIGH (ref 65–99)

## 2016-05-24 LAB — TYPE AND SCREEN
ABO/RH(D): O POS
ANTIBODY SCREEN: NEGATIVE
Unit division: 0

## 2016-05-24 LAB — BASIC METABOLIC PANEL
Anion gap: 5 (ref 5–15)
BUN: 42 mg/dL — ABNORMAL HIGH (ref 6–20)
CALCIUM: 7.8 mg/dL — AB (ref 8.9–10.3)
CO2: 18 mmol/L — AB (ref 22–32)
CREATININE: 1.55 mg/dL — AB (ref 0.44–1.00)
Chloride: 120 mmol/L — ABNORMAL HIGH (ref 101–111)
GFR calc non Af Amer: 45 mL/min — ABNORMAL LOW (ref >60)
GFR, EST AFRICAN AMERICAN: 52 mL/min — AB (ref >60)
GLUCOSE: 176 mg/dL — AB (ref 65–99)
Potassium: 4 mmol/L (ref 3.5–5.1)
Sodium: 143 mmol/L (ref 135–145)

## 2016-05-24 LAB — IRON AND TIBC
Iron: 23 ug/dL — ABNORMAL LOW (ref 28–170)
Saturation Ratios: 14 % (ref 10.4–31.8)
TIBC: 164 ug/dL — ABNORMAL LOW (ref 250–450)
UIBC: 141 ug/dL

## 2016-05-24 LAB — MAGNESIUM: Magnesium: 1.9 mg/dL (ref 1.7–2.4)

## 2016-05-24 NOTE — Progress Notes (Signed)
Date:  May 24, 2016 Chart reviewed for concurrent status and case management needs. Will continue to follow patient progress.  Remains on full vent support Discharge Planning: following for needs Expected discharge date: [id number] Marcelle SmilingRhonda Narayan Scull, BSN, [place]3, [place]M   [phone]

## 2016-05-24 NOTE — Progress Notes (Signed)
RT called to PT room to ensure ETT in correct position (PT is agitated and all over the bed). RN is working hard to get PT comfortable. Original placement of ETT post intubation was 20cm lip. RT discovered ETT at 16cm lip. RT deflated ETT cuff and advanced to 20cm lip, reinflated cuff to MOV- resulted in full VT delivery, Sp02 98%, BBS, positive end tidal. RN aware

## 2016-05-24 NOTE — Progress Notes (Signed)
Nutrition Follow-up  DOCUMENTATION CODES:   Severe malnutrition in context of chronic illness, Underweight  INTERVENTION:  - Continue Nepro @ 35 mL/hr which is providing 1512 kcal (96% re-estimated kcal need), 68 grams of protein, 605 mg Phos, and 611 mL free water. - Will monitor for needs s/p extubation.   NUTRITION DIAGNOSIS:   Malnutrition related to chronic illness as evidenced by severe depletion of body fat, severe depletion of muscle mass. -ongoing  GOAL:   Patient will meet greater than or equal to 90% of their needs -met with current TF regimen.  MONITOR:   Vent status, TF tolerance, Weight trends, Labs, I & O's  ASSESSMENT:   31 y.o.femalewith medical history significant for IV drug abuse, endocarditis involving tricuspid valve, and nonadherence with her treatment plan, now presenting to the emergency department with generalized aches and pains, malaise, productive cough, and fatigue.   2/21 Pt continues with OGT with Nepro @ 35 mL/hr. RN at bedside at time of RD visit and reports no issues with TF this AM and no report of any issues overnight. Reviewed Palliative Care note from yesterday which states plan for no escalation of care and no re-intubation once pt is extubated. RN reports that pt is weaning throughout the day today with plan for extubation 2/22. Weight stable over the past 2-3 days.   Patient is currently intubated on ventilator support MV: 11.9 L/min Temp (24hrs), Avg:99 F (37.2 C), Min:98 F (36.7 C), Max:99.8 F (37.7 C)  Medications reviewed; 60 mg IV Lasix/day, 15 mL liquid multivitamin/day, 40 mg Protonix per OGT/day.  Labs reviewed; CBG: 138 mg/dL this AM, Cl: 120 mmol/L, BUN: 42 mg/dL, creatinine: 1.55 mg/dL, Ca: 7.8 mg/dL, GFR: 45 mL/min, Phos yesterday: 5.2 mg/dL.  Drips: Fentanyl @ 100 mcg/hr, Precedex @ 2.3 mcg/kg/hr.     2/19 - TF changed from [place] 1.2 to Nepro by this RD during last assessment d/t worsening renal panel with  hyperkalemia and hyperphosphatemia.  - Pt continues with TF at goal: Nepro @ 35 mL/hr per OGT. - Per rounds this AM, Palliative to review pt's case again.  - Weight significantly up since last assessment (+7.6 kg) and since admission (+16.6 kg); continue to use admission weight in re-estimating kcal need this AM.  Patient is currently intubated on ventilator support MV: 11.7 L/min Temp (24hrs), Avg:98.4 F (36.9 C), Min:97.2 F (36.2 C), Max:99.5 F (37.5 C) Mg was WDL on 2/17 and 2/18, no Phos since 2/17 when it was 10 mg/dL. IVF: NS @ 50 mL/hr. Drips: Precedex @ 0.8 mcg/kg/hr, Fentanyl @ 150 mcg/hr, Versed @ 2 mg/hr.     2/16 - RN reports failed attempt to wean from vent this AM d/t severe agitation.  - TF was held when pt was weaning d/t concern that she would pull OGT.  - Prior to hold, pt was receiving Jevity 1.2 @ 40 mL/hr with 30 mL Prostat/day which provides 1252 kcal (83% re-estimated kcal need), 68 grams of protein, and 775 mL free water.  - TF has now been re-started @ 40 mL/hr and plan to increase to goal rate of Jevity 1.2 @ 50 mL/hr (without Prostat) at 1300.  - This regimen will provide 1440 kcal (95% re-estimated kcal need), 67 grams of protein, and 968 mL free water.  - Noted no BM since 2/12 documented.  - Continue to use admission weight (43.1 kg) in estimating needs as weight was stable 2/3-2/10 and has been trending up since transfer to ICU and need for  IVF.   Patient is currently intubated on ventilator support MV: 12.8L/min Temp (24hrs), Avg:98.3 F (36.8 C), Min:97.9 F (36.6 C), Max:99 F (37.2 C) BP: 120/66 and MAP: 84. PRN Dulcolax. K: 6 mmol/L, Phos: 9.2 mg/dL.  IVF:NS @ 125 mL/hr.  Drips:Fentanyl @ 125 mcg/hr, Versed @ 2.5 mg/hr.     Diet Order:  Diet NPO time specified  Skin:  Reviewed, no issues  Last BM:  2/20  Height:   Ht Readings from Last 1 Encounters:  05/18/16 '5\' 5"'$  (1.651 m)    Weight:   Wt Readings from Last 1  Encounters:  05/24/16 132 lb 11.5 oz (60.2 kg)    Ideal Body Weight:  56.8 kg  BMI:  Body mass index is 22.09 kg/m.  Estimated Nutritional Needs:   Kcal:  1570  Protein:  65-73 grams (1.5-1.7 grams/kg)  Fluid:  1.5-1.7L/day  EDUCATION NEEDS:   No education needs identified at this time     Jarome Matin, MS, RD, LDN, CNSC Inpatient Clinical Dietitian Pager # [phone] After hours/weekend pager # [phone]

## 2016-05-24 NOTE — Progress Notes (Signed)
Daily Progress Note   Patient Name: Catherine Hernandez       Date: 05/24/2016 DOB: [birth year]  Age: 31 y.o. MRN#: [id number] Attending Physician: Marshell Garfinkel, MD Primary Care Physician: No PCP Per Patient Admit Date: 05/12/2016  Reason for Consultation/Follow-up: Establishing goals of care and Psychosocial/spiritual support  Subjective:  - spoke with [name]/patient's Aunt/ main support person at this time.  Inez Catalina shared stories about Mariely when she was a child, she speaks to her sadness but  Believes  that goal is to give Brinlynn the best chance of successful extubation, then at the time no further intubation is desired.  No escalation of care, never a trach or PEG considered.      Comfort and dignity are the focus of care  -I detailed a comfort care path for this patient, discussed a Hospice facility for EOL care and utilization of medications for symptom  Management.  Family anticipates extubation in the [place]    Questions and concerns addressed.   Family encouraged to call with questions or concerns.  PMT will continue to support holistically.   Length of Stay: 18  Current Medications: Scheduled Meds:  . chlorhexidine gluconate (MEDLINE KIT)  15 mL Mouth Rinse BID  . Chlorhexidine Gluconate Cloth  6 each Topical Daily  . feeding supplement (NEPRO CARB STEADY)  1,000 mL Per Tube Q24H  . furosemide  60 mg Intravenous Daily  . linezolid (ZYVOX) IV  600 mg Intravenous Q12H  . mouth rinse  15 mL Mouth Rinse QID  . multivitamin  15 mL Per Tube Daily  . pantoprazole sodium  40 mg Per Tube Q1200  . sodium chloride flush  3 mL Intravenous Q12H    Continuous Infusions: . dexmedetomidine 2.5 mcg/kg/hr (05/24/16 1405)  . fentaNYL infusion INTRAVENOUS 50 mcg/hr (05/24/16 1155)     PRN Meds: acetaminophen, bisacodyl, fentaNYL, guaiFENesin, lip balm, ondansetron **OR** ondansetron (ZOFRAN) IV, sodium chloride flush  Physical Exam  Constitutional: She appears cachectic. She appears ill. She is sedated and intubated.  HENT:  Mouth/Throat: Abnormal dentition. Dental caries present.  Cardiovascular: Tachycardia present.   Pulmonary/Chest: She is intubated.  Neurological: Cranial nerve deficit:    Skin: Skin is warm and dry.            Vital Signs: BP 115/88   Pulse 93  Temp 98.3 F (36.8 C) (Axillary)   Resp (!) 27   Ht '5\' 5"'$  (1.651 m)   Wt 60.2 kg (132 lb 11.5 oz)   SpO2 99%   BMI 22.09 kg/m  SpO2: SpO2: 99 % O2 Device: O2 Device: Ventilator O2 Flow Rate: O2 Flow Rate (L/min): 40 L/min  Intake/output summary:   Intake/Output Summary (Last 24 hours) at 05/24/16 1533 Last data filed at 05/24/16 1500  Gross per 24 hour  Intake          2652.48 ml  Output             2660 ml  Net            -7.52 ml   LBM: Last BM Date: 05/24/16 Baseline Weight: Weight: 43.1 kg (95 lb) Most recent weight: Weight: 60.2 kg (132 lb 11.5 oz)         Flowsheet Rows   Flowsheet Row Most Recent Value  Intake Tab  Referral Department  Hospitalist  Unit at Time of Referral  Med/Surg Unit  Palliative Care Primary Diagnosis  Sepsis/Infectious Disease  Date Notified  05/10/16  Palliative Care Type  Return patient Palliative Care  Reason for referral  Clarify Goals of Care  Date of Admission  05/19/2016  Date first seen by Palliative Care  05/10/16  # of days Palliative referral response time  0 Day(s)  # of days IP prior to Palliative referral  4  Clinical Assessment  Palliative Performance Scale Score  40%  Pain Max last 24 hours  6  Pain Min Last 24 hours  5  Dyspnea Max Last 24 Hours  7  Dyspnea Min Last 24 hours  6  Nausea Max Last 24 Hours  1  Nausea Min Last 24 Hours  0  Anxiety Max Last 24 Hours  9  Anxiety Min Last 24 Hours  6  Psychosocial &  Spiritual Assessment  Palliative Care Outcomes  Patient/Family meeting held?  Yes  Who was at the meeting?  patient who is decisional.   Palliative Care Outcomes  Clarified goals of care      Patient Active Problem List   Diagnosis Date Noted  . Acute respiratory failure ([place])   . DNR (do not resuscitate)   . ARF (acute renal failure) ([place])   . MRSA bacteremia 05/08/2016  . History of pulmonary embolism 05/04/2016  . Serratia infection 04/04/2016  . IVDU (intravenous drug user) 04/04/2016  . Tobacco abuse 04/03/2016  . Palliative care by specialist 03/24/2016  . Septic embolism ([place])   . HCAP (healthcare-associated pneumonia) 03/15/2016  . Symptomatic anemia 03/14/2016  . 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 Polysubstance abuse   . Enterococcal bacteremia 11/18/2015  . Staphylococcus aureus bacteremia 11/18/2015  . Normocytic anemia 11/18/2015  . Poor dentition 11/18/2015  . Protein-calorie malnutrition, severe ([place]) 11/18/2015  . Acute kidney injury ([place])   . Opioid use disorder, severe, dependence ([place]) 11/17/2015  . Hyponatremia 11/17/2015  . Thrombocytopenia ([place]) 11/17/2015  . Cavitary lung disease     Palliative Care Assessment & Plan   Patient Profile:  31 y.o. 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 Patient found to have septic emboli on CT angiogram in December and was started on Xarelto for systemic anticoagulation.  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  Over last 6 mnths, she has been bacteremic with MSSA, serratia & MRSA this time.  Currently intubated  Dense psychosocial issues   Family face advanced directive decisions and anticipatory care needs  Assessment:   Family is faced treatment option decisions and anticipatory  care needs    Code Status:  DNR-documented today- once liberated from the vent no re-intubation      Code Status Orders        Start     Ordered   05/14/2016 1947  Full code  Continuous     05/20/2016 1952    Code Status History    Date Active Date Inactive Code Status Order ID Comments User Context   04/03/2016  2:22 AM 04/09/2016  6:41 PM Full Code [id number]  Ivor Costa, MD ED   03/20/2016 12:44 AM 03/28/2016  5:48 PM Full Code [id number]  Etta Quill, DO ED   03/15/2016  5:24 PM 03/15/2016 11:20 PM Full Code [id number]  Patrecia Pour, MD Inpatient   12/16/2015  9:06 AM 12/19/2015 11:22 PM Full Code [id number]  Waldemar Dickens, MD ED   11/17/2015  1:50 AM 12/02/2015  5:55 PM Full Code [id number]  Karmen Bongo, MD Inpatient       Prognosis:   Unable to determine  Discharge Planning:  To Be Determined  Care plan was discussed with bedside nurse    Thank you for allowing the Palliative Medicine Team to assist in the care of this patient. Will continue to support holistically   Time In: 1615 Time Out: 1655 Total Time 35 min Prolonged Time Billed  no       Greater than 50%  of this time was spent counseling and coordinating care related to the above assessment and plan.  Wadie Lessen, NP  Please contact Palliative Medicine Team phone at [phone] for questions and concerns.

## 2016-05-24 NOTE — Progress Notes (Signed)
[facility] Attending Note  Presenting HPI:  31 y.o. 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 Patient found to have septic emboli on CT angiogram in December and was started on Xarelto for systemic anticoagulation.  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 Over last 6 mnths, she has been bacteremic with MSSA, serratia & MRSA this time  Lines/ Tubes ETT 2/14 >> RIJ 2/14 >>  IMAGING/STUDIES: CTA CHEST 03/22/16:  Per radiologist. Bilateral lower lobe pulmonary emboli with taper. Since which can be seen with chronic pulmonary emboli or possible vasculopathy secondary to septic emboli. Multiple cavitating and non-cavitating pneumonia/consolidation of varying ages consistent with septic emboli. Trace pleural effusions. Mild cardiomegaly with dilated right atrium. Suspect splenomegaly.  VENOUS DUPLEX BILATERAL LOWER EXTREMITIES 03/23/16: No evidence of DVT or SVT involving right or left lower extremity.  CT CHEST W/O 05/05/2016:  Previously reviewed by me. No pleural effusion or thickening. No pericardial effusion. Multiple cavitary nodules and nodular opacities throughout all lung zones. Precarinal lymphadenopathy with significant enlargement up to 1.5 cm in short axis by my review.  TTE 05/08/16:  LV normal in size with EF 60-65% & no regional wall motion abnormalities. LA normal in size. RA severely dilated. RV moderately dilated with preserved systolic function. No aortic stenosis or regurgitation. Aortic root normal in size. No mitral stenosis or regurgitation. No pulmonic stenosis or regurgitation. Severe tricuspid regurgitation. No pericardial effusion.  Significant events  2/14   At approximately 2am pt expeirenced coughing spell which  progressed into massive hemoptysis requiring emergent intubation by anesthesia. bronch >> RLL bleeding  SUBJ -  A bit more active and agitated this am precedex at 2.3, fentanyl at 100 Tolerating PSV 10  Temp:  [98 F (36.7 C)-100.8 F (38.2 C)] 98.3 F (36.8 C) (02/21 1200) Pulse Rate:  [85-111] 111 (02/21 1200) Resp:  [16-42] 37 (02/21 1200) BP: (109-136)/(68-98) 133/97 (02/21 1200) SpO2:  [96 %-100 %] 99 % (02/21 1200) FiO2 (%):  [30 %] 30 % (02/21 1137) Weight:  [60.2 kg (132 lb 11.5 oz)] 60.2 kg (132 lb 11.5 oz) (02/21 0425)   General:  Ill appearing young woman, ventilated Neuro:  Eyes open , moving B UE, LE, motioning, follows commands.  HEENT: ETT in good position  Cardiovascular: regular, tachy, 2/6 M Lungs: coarse B  Abdomen:soft, benign Musculoskeletal: no deformities Skin: B skin lesions, UE and LE, healing   Recent Labs Lab 05/23/16 0442 05/23/16 1712 05/24/16 0318  HGB 6.9* 8.0* 9.1*  HCT 22.4* 25.5* 28.9*  WBC 6.4 6.8 12.9*  PLT 128* 123* 129*    Recent Labs Lab 05/18/16 0938  05/20/16 0500 05/21/16 0353 05/22/16 0422 05/23/16 0442 05/24/16 0318  NA 136  < > 137 137 139 142 143  K 5.1  < > 5.3* 4.8 4.3 4.1 4.0  CL 110  < > 115* 116* 118* 120* 120*  CO2 18*  < > 16* 15* 17* 17* 18*  GLUCOSE 90  < > 120* 130* 161* 135* 176*  BUN 40*  < > 52* 51* 47* 46* 42*  CREATININE 2.05*  < > 2.51* 2.29* 1.97* 1.65* 1.55*  CALCIUM 7.9*  < > 7.6* 7.7* 7.7* 7.5* 7.8*  MG 1.8  --  1.8 1.8  --  2.0 1.9  PHOS 9.2*  --  10.0*  --   --  5.2*  --   < > = values in this interval not displayed. No results for input(s): PHART, PCO2ART, PO2ART, HCO3, TCO2, O2SAT in the last 168 hours.  Invalid input(s): PCO2, PO2 No results for input(s): AST, ALT, ALKPHOS, BILITOT, PROT, ALBUMIN, INR in the last 168 hours.    MICROBIOLOGY: HIV 2/3:  Nonreactive Blood Cultures x2 2/3:  1/2 Bottles MRSA Urine Culture 2/3:  <10,000 Colonies insignificant growth Sputum Culture  2/4:  Moderate MSSA Sputum AFB Smear/Culture 2/4 >>>  Smear Negative / Culture pending Blood Culture x4 2/5:  4/4 Positive MRSA Sputum AFB Smear/Culture 2/7 >>> Smear Negative / Culture pending Blood Culture x1 2/7:  MRSA Influenza A/B PCR 2/8:  Negative   ANTIBIOTICS: Cefepime 2/3 (x1 dose) Vancomycin 2/3 - 2/8 Daptomycin 2/8 - 2/12 Zyvox 2/12 >>>  ASSESSMENT/PLAN:  31 y.o. female with history of IV drug use presenting with MRSA bacteremia . 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 Course  complicated by massive hemoptysis 2/14  for which she required intubation, eliquis stopped 2/12  PULMONARY A: Acute hypoxemic respiratory failure Hemoptysis >> bronch shows RLL source Pulmonary emboli P:   Continue PRVC 8cc/kg Continue PSV as tolerated. Note Palliative Care discussion from 2/20, niece would not want her trached or to have continued indefinite critical care. I agree with this - planning for an extubation for goal success, no reintubation if fails.  anticoag on hold  CARDIOVASCULAR A:  Hypotension - due to meds (versed)  & vent, resolved TV endocarditis with severe TR P:  Not a candidate for valve surgery Unclear whether she will comply with longterm abx for her endocarditis.   RENAL A:   AKI - Low FeNa s/o prerenal but progressed to ATN, appears to have plateaued 2/16, improving daily Hyperkalemia, resolved Non-AG Metabolic acidosis, suspect component hyperchloremia P:  Diuresis as she can tolerate Follow UOP and BMP Avoid nephrotoxins  GASTROINTESTINAL A:   Anemia of critical illness Doubt UGIB - feel aspirated blood P:   Follow CBC On TF PPI qd Goal Hgb > 7.0  HEMATOLOGIC A:   ABLA + anemia of critical illness. Pulmonary embolism P:  Follow CBC Continue to hold Eliquis. Restart heparin once acute illness stabilizes.    INFECTIOUS A:   Serratia and MRSA bacteremia secondary to IVDA TV endocarditis > progressed on TTE this admission MRSA bacteremia, MSSA multifocal cavitary pneumonia.  Leucocytosis P:   Continue linezolid (started 2/12)  Follow cx data, fever curve, WBC Appreciate Dr Hale Bogus assistance.   ENDOCRINE A:   Hypoglycemia, resolved P:   Follow CBG  NEUROLOGIC A:   Acute metabolic encephalopahty Polysubstance abuse P:   RASS goal: -1 Continue current precedex and fentanyl Do NOT want to increase her sedation given likely goal extubation next 1-2 days   Global: not a candidate for SNF placement as she has left SNF AMA. Palliative re-consulted 2/13, met w aunt 2/20. There has been some progress made w regard to acute situation, but long term therapy and prognosis remains very poor. Pt's aunt is aware (see prior family mtg discussion below). I think an extubation for possible success, acknowledging that if she were to fail that we would not reintubate, make her comfortable, is reasonable. At every juncture as we progress (determining anticoag, determining duration and delivery of abx, etc) we will have to  judge escalation of her care against the risk / chances for a relapse of her IVDA.    FAMILY  - Updates: Dr Lamonte Sakai spoke with pt's aunt and cousin at bedside 2/18, her closest family. Pt has not wanted her mom involved in her care. They understandably are exasperated with the situation since pt's drug habit has not allowed her to ever stay on abx, recover from the two underlying problems - endocarditis and IVDA. They are trying to factor her overall prognosis into our acute decision making regarding trach, next steps for this hospitalization. I believe that this is appropriate, and we will continue to seek their input. Her aunt does not believe that pt will every get clean - this influences every decision we make as we go forward. I hope that pt will extubate and that we can include her  in these discussions as well, unclear that this is achievable without trach.  Update: based on palliative Care discussion 2/20, will plan to get her prepared for a one-way extubation in next 1-2 days, goal success. If she fails then we will make her comfortable. If she decompensates in any other way then we will palliate  - Inter-disciplinary family meet or Palliative Care meeting due by: [facility] Medicine following, appreciate input.   Independent CC time 35 minutes.   Baltazar Apo, MD, PhD 05/24/2016, 1:10 PM [facility] [phone] or if no answer [phone]

## 2016-05-24 NOTE — Progress Notes (Signed)
Patient ID: Margretta DittyAngela N Kirtz, female   DOB: 02-28-1986, 31 y.o.   MRN: [id number]          [facility]    Date of Admission:  05/09/2016    Total days of antibiotics 19        Day 10 linezolid  She is more alert and agitated today. She remains on the ventilator. She is motioning to her nurse indicating that she wants more medication (for pain/agitation). Her chest x-ray shows some improvement in her right base infiltrates. There are some areas of cavitation. I will continue linezolid.         Cliffton AstersJohn Rayansh Herbst, MD [facility] [phone] pager   [phone] cell 05/24/2016, 1:07 PM

## 2016-05-25 DIAGNOSIS — J189 Pneumonia, unspecified organism: Secondary | ICD-10-CM

## 2016-05-25 DIAGNOSIS — F17210 Nicotine dependence, cigarettes, uncomplicated: Secondary | ICD-10-CM

## 2016-05-25 DIAGNOSIS — J984 Other disorders of lung: Secondary | ICD-10-CM

## 2016-05-25 DIAGNOSIS — D696 Thrombocytopenia, unspecified: Secondary | ICD-10-CM

## 2016-05-25 DIAGNOSIS — L8990 Pressure ulcer of unspecified site, unspecified stage: Secondary | ICD-10-CM | POA: Insufficient documentation

## 2016-05-25 DIAGNOSIS — N179 Acute kidney failure, unspecified: Secondary | ICD-10-CM

## 2016-05-25 DIAGNOSIS — R7881 Bacteremia: Secondary | ICD-10-CM

## 2016-05-25 DIAGNOSIS — F1120 Opioid dependence, uncomplicated: Secondary | ICD-10-CM

## 2016-05-25 DIAGNOSIS — E871 Hypo-osmolality and hyponatremia: Secondary | ICD-10-CM

## 2016-05-25 DIAGNOSIS — A4901 Methicillin susceptible Staphylococcus aureus infection, unspecified site: Secondary | ICD-10-CM

## 2016-05-25 DIAGNOSIS — Z79899 Other long term (current) drug therapy: Secondary | ICD-10-CM

## 2016-05-25 DIAGNOSIS — F1910 Other psychoactive substance abuse, uncomplicated: Secondary | ICD-10-CM

## 2016-05-25 LAB — GLUCOSE, CAPILLARY
Glucose-Capillary: 142 mg/dL — ABNORMAL HIGH (ref 65–99)
Glucose-Capillary: 110 mg/dL — ABNORMAL HIGH (ref 65–99)

## 2016-05-25 MED ORDER — GLYCOPYRROLATE 0.2 MG/ML IJ SOLN
0.4000 mg | Freq: Four times a day (QID) | INTRAMUSCULAR | Status: DC
  Administered 2016-05-25 – 2016-05-27 (×10): 0.4 mg via INTRAVENOUS
  Filled 2016-05-25 (×10): qty 2

## 2016-05-25 MED ORDER — LORAZEPAM 2 MG/ML IJ SOLN
2.0000 mg | INTRAMUSCULAR | Status: DC | PRN
Start: 2016-05-25 — End: 2016-05-26
  Administered 2016-05-26: 2 mg via INTRAVENOUS
  Filled 2016-05-25: qty 1

## 2016-05-25 MED ORDER — SODIUM CHLORIDE 0.9 % IV SOLN
1.0000 mg/h | INTRAVENOUS | Status: DC
  Administered 2016-05-25: 2 mg/h via INTRAVENOUS
  Administered 2016-05-26: 13 mg/h via INTRAVENOUS
  Administered 2016-05-26: 4 mg/h via INTRAVENOUS
  Administered 2016-05-26: 7 mg/h via INTRAVENOUS
  Administered 2016-05-26: 12 mg/h via INTRAVENOUS
  Administered 2016-05-26 – 2016-05-27 (×3): 14 mg/h via INTRAVENOUS
  Administered 2016-05-27: 15 mg/h via INTRAVENOUS
  Administered 2016-05-27 (×3): 14 mg/h via INTRAVENOUS
  Filled 2016-05-25 (×13): qty 5

## 2016-05-25 MED ORDER — DEXMEDETOMIDINE HCL IN NACL 400 MCG/100ML IV SOLN
0.4000 ug/kg/h | INTRAVENOUS | Status: DC
  Administered 2016-05-25 (×2): 2.2 ug/kg/h via INTRAVENOUS
  Administered 2016-05-26: 2.1 ug/kg/h via INTRAVENOUS
  Administered 2016-05-26: 1.8 ug/kg/h via INTRAVENOUS
  Administered 2016-05-26: 2 ug/kg/h via INTRAVENOUS
  Filled 2016-05-25 (×5): qty 100

## 2016-05-25 MED ORDER — ACETAMINOPHEN 650 MG RE SUPP
650.0000 mg | RECTAL | Status: DC | PRN
  Administered 2016-05-28: 650 mg via RECTAL
  Filled 2016-05-25: qty 1

## 2016-05-25 NOTE — Progress Notes (Signed)
NUTRITION NOTE  Pt was extubated around 0925 today. OGT removed at that time. Reviewed RN note from 1124 today. Also reviewed Palliative Care note from yesterday afternoon which states no plan for re-intubation after intubation, no desire for trach or PEG, and if pt declines then she will be transitioned to comfort measures. Pt remains NPO at this time    Trenton GammonJessica Cherri Yera, MS, RD, LDN, [facility] Inpatient Clinical Dietitian Pager # [phone] After hours/weekend pager # [phone]

## 2016-05-25 NOTE — Progress Notes (Signed)
Pt refused temperature and CBG being taken despite education.

## 2016-05-25 NOTE — Progress Notes (Signed)
RN attempted bedside swallow with ice chips, pt coughing immediately after.

## 2016-05-25 NOTE — Procedures (Signed)
Extubation Procedure Note  Patient Details:   Name: Catherine Hernandez DOB: [birth year] MRN: [id number]   Airway Documentation:     Evaluation  O2 sats: stable throughout Complications: No apparent complications Patient did tolerate procedure well. Bilateral Breath Sounds: Rhonchi, Diminished   Yes  Suzan GaribaldiCraddock, Shaida Route Ann 05/25/2016, 9:25 AM

## 2016-05-25 NOTE — Progress Notes (Addendum)
PT Cancellation Note  Patient Details Name: Margretta Dittyngela N Carneiro MRN: [id number] DOB: May 21, 1985   Cancelled Treatment:    Reason Eval/Treat Not Completed: Medical issues which prohibited therapy (per chart pt is agitated and having hallucinations. Will attempt tomorrow. )   Tamala SerUhlenberg, Ziggy Reveles Kistler 05/25/2016, 2:53 PM [phone]

## 2016-05-25 NOTE — Progress Notes (Signed)
Pt reporting seeing people walking around the room taking her IV pain medications from her IV and requesting more.  RN explained no one else has been in the room and her central line IV is working and that all of her pain medication is going to her.  Pt thrashes around in bed, cursing, spitting at Rn.  Will notify MD of hallucinations.

## 2016-05-25 NOTE — Progress Notes (Addendum)
Daily Progress Note   Patient Name: Catherine Hernandez       Date: 05/25/2016 DOB: 08/30/1985  Age: 31 y.o. MRN#: [id number] Attending Physician: Marshell Garfinkel, MD Primary Care Physician: No PCP Per Patient Admit Date: 05/17/2016  Reason for Consultation/Follow-up: Establishing goals of care and Psychosocial/spiritual support  Subjective:  - spoke with [name]/patient's Aunt/ main support person at this time. Update on liberation from vent.  Family verbalizes peace with decicion for comfort for Catherine Hernandez at this time  -limited prognosis discussed  -utilization of medication for symptom management detailed      Comfort and dignity are the focus of care   - Questions and concerns addressed.   Family encouraged to call with questions or concerns.  PMT will continue to support holistically.   Length of Stay: 19  Current Medications: Scheduled Meds:  . chlorhexidine gluconate (MEDLINE KIT)  15 mL Mouth Rinse BID  . Chlorhexidine Gluconate Cloth  6 each Topical Daily  . furosemide  60 mg Intravenous Daily  . linezolid (ZYVOX) IV  600 mg Intravenous Q12H  . mouth rinse  15 mL Mouth Rinse QID  . multivitamin  15 mL Per Tube Daily  . pantoprazole sodium  40 mg Per Tube Q1200  . sodium chloride flush  3 mL Intravenous Q12H    Continuous Infusions: . dexmedetomidine 2.8 mcg/kg/hr (05/25/16 1621)  . fentaNYL infusion INTRAVENOUS Stopped (05/25/16 1404)  . HYDROmorphone 3 mg/hr (05/25/16 1600)    PRN Meds: acetaminophen, bisacodyl, fentaNYL, guaiFENesin, lip balm, ondansetron **OR** ondansetron (ZOFRAN) IV, sodium chloride flush  Physical Exam  Constitutional: She appears cachectic. She appears ill. She is sedated and intubated.  HENT:  Mouth/Throat: Abnormal dentition. Dental caries  present.  Cardiovascular: Tachycardia present.   Pulmonary/Chest: She is intubated.  Neurological: Cranial nerve deficit:    Skin: Skin is warm and dry.            Vital Signs: BP 129/88   Pulse 78   Temp 99.7 F (37.6 C) (Axillary)   Resp (!) 24   Ht _0  (1.651 m)   Wt 60.2 kg (132 lb 11.5 oz)   SpO2 94%   BMI 22.09 kg/m  SpO2: SpO2: 94 % O2 Device: O2 Device: Nasal Cannula O2 Flow Rate: O2 Flow Rate (L/min): 2 L/min  Intake/output summary:  Intake/Output Summary (Last 24 hours) at 05/25/16 1700 Last data filed at 05/25/16 1600  Gross per 24 hour  Intake          1924.22 ml  Output             2700 ml  Net          -775.78 ml   LBM: Last BM Date: 05/24/16 Baseline Weight: Weight: 43.1 kg (95 lb) Most recent weight: Weight: 60.2 kg (132 lb 11.5 oz)         Flowsheet Rows   Flowsheet Row Most Recent Value  Intake Tab  Referral Department  Hospitalist  Unit at Time of Referral  Med/Surg Unit  Palliative Care Primary Diagnosis  Sepsis/Infectious Disease  Date Notified  05/10/16  Palliative Care Type  Return patient Palliative Care  Reason for referral  Clarify Goals of Care  Date of Admission  05/17/2016  Date first seen by Palliative Care  05/10/16  # of days Palliative referral response time  0 Day(s)  # of days IP prior to Palliative referral  4  Clinical Assessment  Palliative Performance Scale Score  40%  Pain Max last 24 hours  6  Pain Min Last 24 hours  5  Dyspnea Max Last 24 Hours  7  Dyspnea Min Last 24 hours  6  Nausea Max Last 24 Hours  1  Nausea Min Last 24 Hours  0  Anxiety Max Last 24 Hours  9  Anxiety Min Last 24 Hours  6  Psychosocial & Spiritual Assessment  Palliative Care Outcomes  Patient/Family meeting held?  Yes  Who was at the meeting?  patient who is decisional.   Palliative Care Outcomes  Clarified goals of care      Patient Active Problem List   Diagnosis Date Noted  . Pressure injury of skin 05/25/2016  . Acute on chronic  respiratory failure with hypoxia ([place])   . DNR (do not resuscitate)   . ARF (acute renal failure) ([place])   . MRSA bacteremia 05/08/2016  . History of pulmonary embolism 05/29/2016  . Serratia infection 04/04/2016  . IVDU (intravenous drug user) 04/04/2016  . Tobacco abuse 04/03/2016  . Palliative care by specialist 03/24/2016  . Septic embolism ([place])   . HCAP (healthcare-associated pneumonia) 03/15/2016  . Symptomatic anemia 03/14/2016  . 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 Polysubstance abuse   . Enterococcal bacteremia 11/18/2015  . Staphylococcus aureus bacteremia 11/18/2015  . Normocytic anemia 11/18/2015  . Poor dentition 11/18/2015  . Protein-calorie malnutrition, severe ([place]) 11/18/2015  . Acute kidney injury ([place])   . Opioid use disorder, severe, dependence ([place]) 11/17/2015  . Hyponatremia 11/17/2015  . Thrombocytopenia ([place]) 11/17/2015  . Cavitary lung disease     Palliative Care Assessment & Plan   Patient Profile:  31 y.o. 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 Patient found to have septic emboli on CT angiogram in December and was started on Xarelto for systemic anticoagulation.  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 Over last 6 mnths, she has been bacteremic with MSSA, serratia & MRSA this time.   Liberated from Ventilator this morning   Dense psychosocial issues     Assessment/Plan   Patient liberated from ventilator this morning, focus of care is comfort -continue Dilaudid gtt and titrate to  comfort -prn Ativan for agitation -no further diagnostics, IV fluids or antibiotics; no life prolonging measures -Hospice facility if indicated   -   Code Status:  DNR-comfort is focus of care      Code Status Orders         Start     Ordered   05/27/2016 1947  Full code  Continuous     05/14/2016 1952    Code Status History    Date Active Date Inactive Code Status Order ID Comments User Context   04/03/2016  2:22 AM 04/09/2016  6:41 PM Full Code [id number]  Ivor Costa, MD ED   03/20/2016 12:44 AM 03/28/2016  5:48 PM Full Code [id number]  Etta Quill, DO ED   03/15/2016  5:24 PM 03/15/2016 11:20 PM Full Code [id number]  Patrecia Pour, MD Inpatient   12/16/2015  9:06 AM 12/19/2015 11:22 PM Full Code [id number]  Waldemar Dickens, MD ED   11/17/2015  1:50 AM 12/02/2015  5:55 PM Full Code [id number]  Karmen Bongo, MD Inpatient       Prognosis:  Hours to days  Discharge Planning:  Expect hospital death  Care plan was discussed with bedside nurse Janett Billow   Thank you for allowing the Palliative Medicine Team to assist in the care of this patient. Will continue to support holistically   Time In: 1730 Time Out: 1805 Total Time 35 min Prolonged Time Billed  no       Greater than 50%  of this time was spent counseling and coordinating care related to the above assessment and plan.  Wadie Lessen, NP  Please contact Palliative Medicine Team phone at [phone] for questions and concerns.

## 2016-05-25 NOTE — Progress Notes (Signed)
Pt refusing to be turned, pulled up in bed, is spitting at staff, and is slamming call bell on bed rails.  Refused temp and CBG to be taken but RN and NT were able to get both after educating on the importance of both.

## 2016-05-25 NOTE — Progress Notes (Signed)
PCCM Interval Note  I evaluated pt earlier this pm after her extubation. She was tolerating the WOB without difficulty. She told me that she was in pain, that she wanted her symptoms managed, wanted "ativan and pain medication". She has argued and been aggressive with RN's, does not indicate that she wants to undertake the long therapeutic road to (possible) recovery from her endocarditis. She does appear to be uncomfortable. She remains in restraints. I asked her if she would like for me to give her more medications for her comfort and she indicated that she would. Based on that discussion and her overall poor prognosis for overall recovery I started her on a dilaudid gtt. I will leave the precedex for now but hopefully be able to wean to off. I believe we would be justified in stopping all meds that do not focus on comfort.   Independent CC time 30 minutes.   Levy Pupaobert Aicia Babinski, MD, PhD 05/25/2016, 4:59 PM [facility] [phone] or if no answer [phone]

## 2016-05-25 NOTE — Progress Notes (Signed)
ETT out to 18cm, documented at 20 previously.  Notified RT who is at bedside.

## 2016-05-25 NOTE — Progress Notes (Signed)
[facility] Attending Note  Presenting HPI:  31 y.o. 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 Patient found to have septic emboli on CT angiogram in December and was started on Xarelto for systemic anticoagulation.  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 Over last 6 mnths, she has been bacteremic with MSSA, serratia & MRSA this time  Lines/ Tubes ETT 2/14 >> RIJ 2/14 >>  IMAGING/STUDIES: CTA CHEST 03/22/16:  Per radiologist. Bilateral lower lobe pulmonary emboli with taper. Since which can be seen with chronic pulmonary emboli or possible vasculopathy secondary to septic emboli. Multiple cavitating and non-cavitating pneumonia/consolidation of varying ages consistent with septic emboli. Trace pleural effusions. Mild cardiomegaly with dilated right atrium. Suspect splenomegaly.  VENOUS DUPLEX BILATERAL LOWER EXTREMITIES 03/23/16: No evidence of DVT or SVT involving right or left lower extremity.  CT CHEST W/O 05/05/2016:  Previously reviewed by me. No pleural effusion or thickening. No pericardial effusion. Multiple cavitary nodules and nodular opacities throughout all lung zones. Precarinal lymphadenopathy with significant enlargement up to 1.5 cm in short axis by my review.  TTE 05/08/16:  LV normal in size with EF 60-65% & no regional wall motion abnormalities. LA normal in size. RA severely dilated. RV moderately dilated with preserved systolic function. No aortic stenosis or regurgitation. Aortic root normal in size. No mitral stenosis or regurgitation. No pulmonic stenosis or regurgitation. Severe tricuspid regurgitation. No pericardial effusion.  Significant events  2/14   At approximately 2am pt expeirenced coughing spell which  progressed into massive hemoptysis requiring emergent intubation by anesthesia. bronch >> RLL bleeding  SUBJ -  She is awake, a bit tachypneic on PSV 5 but good Vt Fentanyl at 100, precedex at 2.3 She wants ETT out, wants to eat  Temp:  [98.3 F (36.8 C)-101.4 F (38.6 C)] 101.4 F (38.6 C) (02/22 0800) Pulse Rate:  [85-111] 95 (02/22 0700) Resp:  [19-37] 24 (02/22 0700) BP: (115-134)/(75-98) 119/82 (02/22 0600) SpO2:  [99 %-100 %] 100 % (02/22 0700) FiO2 (%):  [30 %] 30 % (02/22 0736) Weight:  [60.2 kg (132 lb 11.5 oz)] 60.2 kg (132 lb 11.5 oz) (02/22 0432)   General:  Ill appearing, on PSV Neuro:  Wide awake even on sedating gtt's, motioning and communicating, moves all ext.  HEENT: ETT OK, PERRL Cardiovascular:regular, tachy, 2/6 syst M Lungs: coarse BS B Abdomen:soft, benign Musculoskeletal: no deformities Skin: B skin excoriations and punctate lesions, healing   Recent Labs Lab 05/23/16 0442 05/23/16 1712 05/24/16 0318  HGB 6.9* 8.0* 9.1*  HCT 22.4* 25.5* 28.9*  WBC 6.4 6.8 12.9*  PLT 128* 123* 129*    Recent Labs Lab 05/18/16 0938  05/20/16 0500 05/21/16 0353 05/22/16 0422 05/23/16 0442 05/24/16 0318  NA 136  < > 137 137 139 142 143  K 5.1  < > 5.3* 4.8 4.3 4.1 4.0  CL 110  < > 115* 116* 118* 120* 120*  CO2 18*  < > 16* 15* 17* 17* 18*  GLUCOSE 90  < > 120* 130* 161* 135* 176*  BUN 40*  < > 52* 51* 47* 46* 42*  CREATININE 2.05*  < > 2.51* 2.29* 1.97* 1.65* 1.55*  CALCIUM 7.9*  < > 7.6* 7.7* 7.7* 7.5* 7.8*  MG 1.8  --  1.8 1.8  --  2.0 1.9  PHOS 9.2*  --  10.0*  --   --  5.2*  --   < > = values in this interval not displayed. No results for input(s): PHART, PCO2ART, PO2ART, HCO3, TCO2, O2SAT in the last 168 hours.  Invalid input(s): PCO2, PO2 No results for input(s): AST, ALT, ALKPHOS, BILITOT, PROT, ALBUMIN, INR in the last 168 hours.    MICROBIOLOGY: HIV 2/3:  Nonreactive Blood Cultures x2 2/3:  1/2 Bottles MRSA Urine Culture 2/3:  <10,000  Colonies insignificant growth Sputum Culture 2/4:  Moderate MSSA Sputum AFB Smear/Culture 2/4 >>>  Smear Negative / Culture pending Blood Culture x4 2/5:  4/4 Positive MRSA Sputum AFB Smear/Culture 2/7 >>> Smear Negative / Culture pending Blood Culture x1 2/7:  MRSA Influenza A/B PCR 2/8:  Negative   ANTIBIOTICS: Cefepime 2/3 (x1 dose) Vancomycin 2/3 - 2/8 Daptomycin 2/8 - 2/12 Zyvox 2/12 >>>  ASSESSMENT/PLAN:  31 y.o. female with history of IV drug use presenting with MRSA bacteremia . 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 Course  complicated by massive hemoptysis 2/14  for which she required intubation, eliquis stopped 2/12  PULMONARY A: Acute hypoxemic respiratory failure Hemoptysis >> bronch shows RLL source Pulmonary emboli P:   Goal extubate this am, no plan to reintubate, transition to comfort if she fails.  Continue to hold anticoagulation.   CARDIOVASCULAR A:  Hypotension - due to meds (versed)  & vent, resolved TV endocarditis with severe TR P:  Not a valvular surgical candidate Will need long term abx - unclear that she will comply with this  RENAL A:   AKI - Low FeNa s/o prerenal but progressed to ATN, appears to have plateaued 2/16, improving daily Hyperkalemia, resolved Non-AG Metabolic acidosis, suspect component hyperchloremia P:  Continue diuresis, S Cr is tolerating. Note 19L positive for hospitalization Follow BMP and UOP Avoid nephrotoxins.   GASTROINTESTINAL A:   Anemia of critical illness Doubt UGIB - feel aspirated blood P:   On TF, will assess for swallowing once extubated PPI qd  HEMATOLOGIC A:   ABLA + anemia of critical illness. Pulmonary embolism P:  Follow CBC eliquis on hold, will start heparin in the near future to see if tolerated.   INFECTIOUS A:   Serratia and MRSA bacteremia secondary to  IVDA TV endocarditis > progressed on TTE this admission MRSA bacteremia, MSSA multifocal cavitary pneumonia.  Leucocytosis P:   Continue linezolid (started 2/12) Follow Cx data, clinical status. Will likely need a repeat Ct chest in next several weeks to assess improvement in cavitary lesions.  Appreciate ID assistance.   ENDOCRINE A:   Hypoglycemia, resolved P:   Follow CBG  NEUROLOGIC A:   Acute metabolic encephalopahty Polysubstance abuse P:   RASS goal: -1 to 0 Will extubate on precedex and some residual fentanyl, attempt to wean both once off MV   Global: not a candidate for SNF placement as she has left SNF AMA. Palliative re-consulted 2/13, met w aunt 2/20. There has been some progress made w regard to acute situation, but long term therapy and prognosis remains very poor. Pt's aunt is aware (see prior family mtg discussion below). I think an extubation for possible success, acknowledging that if she were to fail that we would not reintubate, make her comfortable, is reasonable. At every juncture as we progress (determining anticoag, determining duration and delivery of  abx, etc) we will have to judge escalation of her care against the risk / chances for a relapse of her IVDA.    FAMILY  - Updates: 2/18 > Dr Lamonte Sakai spoke with pt's aunt and cousin at bedside 2/18, her closest family. Pt has not wanted her mom involved in her care. They understandably are exasperated with the situation since pt's drug habit has not allowed her to ever stay on abx, recover from the two underlying problems - endocarditis and IVDA. They are trying to factor her overall prognosis into our acute decision making regarding trach, next steps for this hospitalization. I believe that this is appropriate, and we will continue to seek their input. Her aunt does not believe that pt will every get clean - this influences every decision we make as we go forward. I hope that pt will extubate and that we can include  her in these discussions as well, unclear that this is achievable without trach.  Update: 2/21 > based on palliative Care discussion 2/20, will plan to get her prepared for a one-way extubation in next 1-2 days, goal success. If she fails then we will make her comfortable. If she decompensates in any other way then we will palliate  - Inter-disciplinary family meet or Palliative Care meeting due by: [facility] Medicine following, appreciate input.   Independent CC time 35 minutes.   Baltazar Apo, MD, PhD 05/25/2016, 8:42 AM [facility] [phone] or if no answer [phone]

## 2016-05-25 NOTE — Progress Notes (Signed)
Patient ID: Catherine Hernandez, female   DOB: [birth year], 31 y.o.   MRN: [id number]          [facility]  Date of Admission:  05/15/2016    Total days of antibiotics 20        Day 11 linezolid         Principal Problem:   Endocarditis of tricuspid valve Active Problems:   HCAP (healthcare-associated pneumonia)   MRSA bacteremia   Enterococcal bacteremia   Staphylococcus aureus bacteremia   Serratia infection   Polysubstance abuse   IVDU (intravenous drug user)   Opioid use disorder, severe, dependence ([place])   Hyponatremia   Thrombocytopenia ([place])   Cavitary lung disease   Poor dentition   Acute kidney injury ([place])   Protein-calorie malnutrition, severe ([facility])   Symptomatic anemia   Septic embolism ([facility])   Tobacco abuse   History of pulmonary embolism   ARF (acute renal failure) ([facility])   Acute on chronic respiratory failure with hypoxia ([place])   DNR (do not resuscitate)   Pressure injury of skin   . chlorhexidine gluconate (MEDLINE KIT)  15 mL Mouth Rinse BID  . Chlorhexidine Gluconate Cloth  6 each Topical Daily  . furosemide  60 mg Intravenous Daily  . linezolid (ZYVOX) IV  600 mg Intravenous Q12H  . mouth rinse  15 mL Mouth Rinse QID  . multivitamin  15 mL Per Tube Daily  . pantoprazole sodium  40 mg Per Tube Q1200  . sodium chloride flush  3 mL Intravenous Q12H   Review of Systems: Review of Systems  Unable to perform ROS: Mental acuity    Past Medical History:  Diagnosis Date  . Anemia   . CAP (community acquired pneumonia) 11/16/2015  . Childhood asthma   . Endocarditis   . Enterococcal bacteremia   . Heroin abuse   . Hyponatremia   . Pneumonia 2007  . Polysubstance abuse     Social History  Substance Use Topics  . Smoking status: Current Every Day Smoker    Packs/day: 0.50    Years: 10.00    Types: Cigarettes  . Smokeless tobacco: Never Used  . Alcohol use No    Family History  Problem Relation Age of Onset  .  Anesthesia problems Neg Hx   . Hypotension Neg Hx   . Malignant hyperthermia Neg Hx   . Pseudochol deficiency Neg Hx    No Known Allergies  OBJECTIVE: Vitals:   05/25/16 1100 05/25/16 1200 05/25/16 1400 05/25/16 1500  BP: (!) 136/100 131/65 140/84   Pulse: 89 85 81 88  Resp: (!) 36 (!) 26 20 (!) 29  Temp:  99.7 F (37.6 C)    TempSrc:  Axillary    SpO2: 100% 100% 100% 95%  Weight:      Height:       Body mass index is 22.09 kg/m.  Physical Exam  Constitutional:  She was successfully extubated this morning. She is currently sedated. Her nurse reports that she had been hallucinating but was able to tell her that she did not want to be reintubated. She also indicated that she wanted to be kept comfortable. She is cachectic.  Cardiovascular: Normal rate and regular rhythm.   No murmur heard. Pulmonary/Chest: Breath sounds normal. She has no wheezes. She has no rales.  She is sleeping quietly in bed with supplemental nasal prong oxygen on.  Skin:  Papular rash on face.    Lab Results Lab Results  Component Value Date   WBC 12.9 (H) 05/24/2016   HGB 9.1 (L) 05/24/2016   HCT 28.9 (L) 05/24/2016   MCV 90.0 05/24/2016   PLT 129 (L) 05/24/2016    Lab Results  Component Value Date   CREATININE 1.55 (H) 05/24/2016   BUN 42 (H) 05/24/2016   NA 143 05/24/2016   K 4.0 05/24/2016   CL 120 (H) 05/24/2016   CO2 18 (L) 05/24/2016    Lab Results  Component Value Date   ALT 9 (L) 05/17/2016   AST 23 05/17/2016   ALKPHOS 112 05/17/2016   BILITOT 1.1 05/17/2016     Microbiology: Recent Results (from the past 240 hour(s))  Fungus Culture With Stain     Status: None (Preliminary result)   Collection Time: 05/17/16  4:01 AM  Result Value Ref Range Status   Fungus Stain Final report  Final    Comment: (NOTE) Performed At: [facility] [address] [place], [place] [id number] Mila Homer MD [id number]    Fungus (Mycology) Culture PENDING  Incomplete    Fungal Source BRONCHIAL WASHINGS  Final    Comment: RIGHT  Acid Fast Smear (AFB)     Status: None   Collection Time: 05/17/16  4:01 AM  Result Value Ref Range Status   AFB Specimen Processing Concentration  Final   Acid Fast Smear Negative  Final    Comment: (NOTE) Performed At: [facility] [address] [place], [place] [id number] Mila Homer MD TY:[id number]    Source (AFB) BRONCHIAL WASHINGS  Final    Comment: RIGHT  Culture, respiratory (NON-Expectorated)     Status: None   Collection Time: 05/17/16  4:01 AM  Result Value Ref Range Status   Specimen Description BRONCHIAL WASHINGS RIGHT  Final   Special Requests NONE  Final   Gram Stain   Final    FEW WBC PRESENT, PREDOMINANTLY PMN FEW GRAM POSITIVE COCCI IN PAIRS Performed at [facility], [address], [place], [place] [address]    Culture   Final    MODERATE METHICILLIN RESISTANT STAPHYLOCOCCUS AUREUS   Report Status 05/19/2016 FINAL  Final   Organism ID, Bacteria METHICILLIN RESISTANT STAPHYLOCOCCUS AUREUS  Final      Susceptibility   Methicillin resistant staphylococcus aureus - MIC*    CIPROFLOXACIN <=0.5 SENSITIVE Sensitive     ERYTHROMYCIN >=8 RESISTANT Resistant     GENTAMICIN <=0.5 SENSITIVE Sensitive     OXACILLIN >=4 RESISTANT Resistant     TETRACYCLINE >=16 RESISTANT Resistant     VANCOMYCIN 1 SENSITIVE Sensitive     TRIMETH/SULFA <=10 SENSITIVE Sensitive     CLINDAMYCIN >=8 RESISTANT Resistant     RIFAMPIN <=0.5 SENSITIVE Sensitive     Inducible Clindamycin NEGATIVE Sensitive     * MODERATE METHICILLIN RESISTANT STAPHYLOCOCCUS AUREUS  Fungus Culture Result     Status: None   Collection Time: 05/17/16  4:01 AM  Result Value Ref Range Status   Result 1 Comment  Final    Comment: (NOTE) KOH/Calcofluor preparation:  no fungus observed. Performed At: [facility] [address] [place], [place] [id number] Mila Homer MD FN:[id number]      ASSESSMENT: She is now  extubated but she continues to do very poorly overall with recurrent tricuspid valve endocarditis and septic pulmonary emboli. We are awaiting further discussion between the palliative care team and her family.  PLAN: 1. Continue linezolid for now pending further discussions of goals of care  Cliffton Asters, MD  [place] [facility] [phone] pager   [phone] cell 05/25/2016, 3:42 PM

## 2016-05-26 DIAGNOSIS — R451 Restlessness and agitation: Secondary | ICD-10-CM

## 2016-05-26 DIAGNOSIS — Z66 Do not resuscitate: Secondary | ICD-10-CM

## 2016-05-26 DIAGNOSIS — J9600 Acute respiratory failure, unspecified whether with hypoxia or hypercapnia: Secondary | ICD-10-CM

## 2016-05-26 DIAGNOSIS — J9601 Acute respiratory failure with hypoxia: Secondary | ICD-10-CM

## 2016-05-26 DIAGNOSIS — Z515 Encounter for palliative care: Secondary | ICD-10-CM

## 2016-05-26 MED ORDER — LORAZEPAM 2 MG/ML IJ SOLN
0.5000 mg/h | INTRAVENOUS | Status: DC
  Administered 2016-05-26: 0.5 mg/h via INTRAVENOUS
  Administered 2016-05-26: 5 mg/h via INTRAVENOUS
  Administered 2016-05-26 – 2016-05-27 (×4): 6.5 mg/h via INTRAVENOUS
  Filled 2016-05-26 (×6): qty 25

## 2016-05-26 NOTE — Progress Notes (Signed)
Daily Progress Note   Patient Name: Catherine Hernandez       Date: 05/26/2016 DOB: 24-Jun-1985  Age: 31 y.o. MRN#: [id number] Attending Physician: Chilton Greathouse, MD Primary Care Physician: No PCP Per Patient Admit Date: 05-24-2016  Reason for Consultation/Follow-up: Establishing goals of care and Psychosocial/spiritual support  Subjective:  - spoke with Catherine Hernandez/patient's Aunt/ main support person at this time. Update on current medical situation, daily updates are requested  She expresses her appreciation for Catherine Hernandez's care  - Family verbalizes peace with decicion for comfort for Catherine Hernandez at this time  -limited prognosis, likely hours to days  -utilization of medication for symptom management detailed      Comfort and dignity are the focus of care   - Questions and concerns addressed.   Family encouraged to call with questions or concerns.  PMT will continue to support holistically.   Length of Stay: 20  Current Medications: Scheduled Meds:  . glycopyrrolate  0.4 mg Intravenous QID  . mouth rinse  15 mL Mouth Rinse QID  . sodium chloride flush  3 mL Intravenous Q12H    Continuous Infusions: . dexmedetomidine 1.8 mcg/kg/hr (05/26/16 0724)  . HYDROmorphone 7 mg/hr (05/26/16 0700)    PRN Meds: acetaminophen, bisacodyl, lip balm, LORazepam, [DISCONTINUED] ondansetron **OR** ondansetron (ZOFRAN) IV, sodium chloride flush  Physical Exam  Constitutional: She appears cachectic. She appears ill. She is sedated and intubated.  HENT:  Mouth/Throat: Abnormal dentition. Dental caries present.  Cardiovascular: Tachycardia present.   Pulmonary/Chest: She is intubated.  Neurological: Cranial nerve deficit:    Skin: Skin is warm and dry.            Vital Signs: BP (!) 91/58   Pulse 91    Temp 98.7 F (37.1 C) (Axillary)   Resp 17   Ht 5\' 5"  (1.651 m)   Wt 60.2 kg (132 lb 11.5 oz)   SpO2 90%   BMI 22.09 kg/m  SpO2: SpO2: 90 % O2 Device: O2 Device: Not Delivered O2 Flow Rate: O2 Flow Rate (L/min): 2 L/min  Intake/output summary:   Intake/Output Summary (Last 24 hours) at 05/26/16 0839 Last data filed at 05/26/16 0700  Gross per 24 hour  Intake          1580.15 ml  Output  1850 ml  Net          -269.85 ml   LBM: Last BM Date: 05/24/16 Baseline Weight: Weight: 43.1 kg (95 lb) Most recent weight: Weight: 60.2 kg (132 lb 11.5 oz)         Flowsheet Rows   Flowsheet Row Most Recent Value  Intake Tab  Referral Department  Hospitalist  Unit at Time of Referral  Med/Surg Unit  Palliative Care Primary Diagnosis  Sepsis/Infectious Disease  Date Notified  05/10/16  Palliative Care Type  Return patient Palliative Care  Reason for referral  Clarify Goals of Care  Date of Admission  13-Jul-2016  Date first seen by Palliative Care  05/10/16  # of days Palliative referral response time  0 Day(s)  # of days IP prior to Palliative referral  4  Clinical Assessment  Palliative Performance Scale Score  40%  Pain Max last 24 hours  6  Pain Min Last 24 hours  5  Dyspnea Max Last 24 Hours  7  Dyspnea Min Last 24 hours  6  Nausea Max Last 24 Hours  1  Nausea Min Last 24 Hours  0  Anxiety Max Last 24 Hours  9  Anxiety Min Last 24 Hours  6  Psychosocial & Spiritual Assessment  Palliative Care Outcomes  Patient/Family meeting held?  Yes  Who was at the meeting?  patient who is decisional.   Palliative Care Outcomes  Clarified goals of care      Patient Active Problem List   Diagnosis Date Noted  . Pressure injury of skin 05/25/2016  . Acute respiratory failure with hypoxia (HCC)   . DNR (do not resuscitate)   . ARF (acute renal failure) (HCC)   . MRSA bacteremia 05/08/2016  . History of pulmonary embolism April 15, 2016  . Serratia infection 04/04/2016  .  IVDU (intravenous drug user) 04/04/2016  . Tobacco abuse 04/03/2016  . Palliative care by specialist 03/24/2016  . Septic embolism (HCC)   . HCAP (healthcare-associated pneumonia) 03/15/2016  . Symptomatic anemia 03/14/2016  . 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 Polysubstance abuse   . Enterococcal bacteremia 11/18/2015  . Staphylococcus aureus bacteremia 11/18/2015  . Normocytic anemia 11/18/2015  . Poor dentition 11/18/2015  . Protein-calorie malnutrition, severe (HCC) 11/18/2015  . Acute kidney injury (HCC)   . Opioid use disorder, severe, dependence (HCC) 11/17/2015  . Hyponatremia 11/17/2015  . Thrombocytopenia (HCC) 11/17/2015  . Cavitary lung disease     Palliative Care Assessment & Plan   Patient Profile:  31 y.o. 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 Patient found to have septic emboli on CT angiogram in December and was started on Xarelto for systemic anticoagulation.  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 Over last 6 mnths, she has been bacteremic with MSSA, serratia & MRSA this time.   Liberated from Ventilator yesterday morning   Dense psychosocial issues     Assessment/Plan   Patient liberated from ventilator this morning, focus of care is comfort -continue Dilaudid gtt and titrate to comfort - [name] for secretions -initiate  Ativan gtt  for agitation -no further diagnostics, IV fluids or antibiotics; no life prolonging measures -Hospice facility if indicated    Code Status:  DNR-comfort is focus of care      Code Status Orders  Start     Ordered   May 15, 2016 1947  Full code  Continuous     05-15-16 1952    Code Status History    Date Active Date Inactive Code Status Order ID Comments User Context    04/03/2016  2:22 AM 04/09/2016  6:41 PM Full Code [id number]  Lorretta Harp, MD ED   03/20/2016 12:44 AM 03/28/2016  5:48 PM Full Code [id number]  Hillary Bow, DO ED   03/15/2016  5:24 PM 03/15/2016 11:20 PM Full Code [id number]  Tyrone Nine, MD Inpatient   12/16/2015  9:06 AM 12/19/2015 11:22 PM Full Code [id number]  Ozella Rocks, MD ED   11/17/2015  1:50 AM 12/02/2015  5:55 PM Full Code [id number]  Jonah Blue, MD Inpatient       Prognosis:  Hours to days  Discharge Planning:  Expect hospital death  Care plan was discussed with bedside nurse Shanda Bumps and Canary Brim NP CCM   Thank you for allowing the Palliative Medicine Team to assist in the care of this patient. Will continue to support holistically   Time In: 0745 Time Out: 0820 Total Time 35 min Prolonged Time Billed  no       Greater than 50%  of this time was spent counseling and coordinating care related to the above assessment and plan.  Lorinda Creed, NP  Please contact Palliative Medicine Team phone at [phone] for questions and concerns.

## 2016-05-26 NOTE — Progress Notes (Addendum)
Pt resting comfortably, restraints discontinued and removed.

## 2016-05-26 NOTE — Progress Notes (Signed)
Patient ID: Margretta DittyAngela N Degollado, female   DOB: 25-Jul-1985, 31 y.o.   MRN: [id number]          [facility]    I note that she has now transitioned to comfort measures only and linezolid has been stopped. I am in agreement with those plans. I will sign off now.         Cliffton AstersJohn Hyun Reali, MD [facility] [phone] pager   [phone] cell 05/26/2016, 12:02 PM

## 2016-05-26 NOTE — Progress Notes (Signed)
Nutrition Brief Note  Chart reviewed. Pt now on room air after being [facility] to [facility] following [facility].  Pt now transitioning to comfort care.  No further nutrition interventions warranted at this time. OGT has been out since extubation. Pt is NPO but PCCM NP note this AM states ice chips and Palliative feeds okay.  Please re-consult as needed.     Trenton GammonJessica Chastity Noland, MS, RD, LDN, [facility] Inpatient Clinical Dietitian Pager # [phone] After hours/weekend pager # [phone]

## 2016-05-26 NOTE — Progress Notes (Signed)
Pulmonary & Critical Care Note  Presenting HPI:  31 y.o. 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 Patient found to have septic emboli on CT angiogram in December and was started on Xarelto for systemic anticoagulation.  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 Over last 6 mnths, she has been bacteremic with MSSA, serratia & MRSA this time  Lines/ Tubes ETT 2/14 >> 2/22 RIJ 2/14 >>  IMAGING/STUDIES: CTA CHEST 03/22/16:  Per radiologist. Bilateral lower lobe pulmonary emboli with taper. Since which can be seen with chronic pulmonary emboli or possible vasculopathy secondary to septic emboli. Multiple cavitating and non-cavitating pneumonia/consolidation of varying ages consistent with septic emboli. Trace pleural effusions. Mild cardiomegaly with dilated right atrium. Suspect splenomegaly.  VENOUS DUPLEX BILATERAL LOWER EXTREMITIES 03/23/16: No evidence of DVT or SVT involving right or left lower extremity.  CT CHEST W/O 05/27/2016:  Previously reviewed by me. No pleural effusion or thickening. No pericardial effusion. Multiple cavitary nodules and nodular opacities throughout all lung zones. Precarinal lymphadenopathy with significant enlargement up to 1.5 cm in short axis by my review.  TTE 05/08/16:  LV normal in size with EF 60-65% & no regional wall motion abnormalities. LA normal in size. RA severely dilated. RV moderately dilated with preserved systolic function. No aortic stenosis or regurgitation. Aortic root normal in size. No mitral stenosis or regurgitation. No pulmonic stenosis or regurgitation. Severe tricuspid regurgitation. No pericardial effusion.  Significant events 2/14  At approximately 2am pt expeirenced coughing spell which  progressed into massive hemoptysis requiring emergent intubation by anesthesia.  bronch >> RLL bleeding  SUBJECTIVE -  RN reports intermittent agitation despite dilaudid gtt.  Pt asking for food/ice.  Spitting at staff intermittently.   Temp:  [98.7 F (37.1 C)-99.7 F (37.6 C)] 98.7 F (37.1 C) (02/22 1943) Pulse Rate:  [78-191] 98 (02/23 0836) Resp:  [14-36] 17 (02/23 0600) BP: (91-140)/(57-100) 92/57 (02/23 0836) SpO2:  [86 %-100 %] 90 % (02/23 0836)   General:  Chronically ill appearing female HEENT: MM pink/dry, dried bloody secretions  Neuro: awake/confused, MAE CV: s1s2 rrr, no m/r/g PULM: even/non-labored, lungs bilaterally clear ZJ:QBHA, non-tender, bsx4 active  Extremities: warm/dry, trace generalized edema  Skin: no rashes or lesions    Recent Labs Lab 05/23/16 0442 05/23/16 1712 05/24/16 0318  HGB 6.9* 8.0* 9.1*  HCT 22.4* 25.5* 28.9*  WBC 6.4 6.8 12.9*  PLT 128* 123* 129*    Recent Labs Lab 05/20/16 0500 05/21/16 0353 05/22/16 0422 05/23/16 0442 05/24/16 0318  NA 137 137 139 142 143  K 5.3* 4.8 4.3 4.1 4.0  CL 115* 116* 118* 120* 120*  CO2 16* 15* 17* 17* 18*  GLUCOSE 120* 130* 161* 135* 176*  BUN 52* 51* 47* 46* 42*  CREATININE 2.51* 2.29* 1.97* 1.65* 1.55*  CALCIUM 7.6* 7.7* 7.7* 7.5* 7.8*  MG 1.8 1.8  --  2.0 1.9  PHOS 10.0*  --   --  5.2*  --    No results for input(s): PHART, PCO2ART, PO2ART, HCO3, TCO2, O2SAT in the last 168 hours.  Invalid input(s): PCO2, PO2 No results for input(s): AST, ALT, ALKPHOS, BILITOT, PROT, ALBUMIN, INR in the last 168 hours.    MICROBIOLOGY: HIV 2/3:  Nonreactive Blood Cultures x2 2/3:  1/2 Bottles MRSA Urine Culture 2/3:  <10,000 Colonies  insignificant growth Sputum Culture 2/4:  Moderate MSSA Sputum AFB Smear/Culture 2/4 >>>  Smear Negative / Culture pending Blood Culture x4 2/5:  4/4 Positive MRSA Sputum AFB Smear/Culture 2/7 >>> Smear Negative / Culture pending Blood Culture x1 2/7:   MRSA Influenza A/B PCR 2/8:  Negative   ANTIBIOTICS: Cefepime 2/3 (x1 dose) Vancomycin 2/3 - 2/8 Daptomycin 2/8 - 2/12 Zyvox 2/12 >> 2/22  ASSESSMENT/PLAN:  31 y.o. female with history of IV drug use presenting with MRSA bacteremia . 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 Course complicated by massive hemoptysis 2/14  for which she required intubation, eliquis stopped 2/12.  Developed prolonged vent needs.  Family elected for comfort care > one way extubation 2/22.    PULMONARY A: Acute hypoxemic respiratory failure Hemoptysis >> bronch shows RLL source Pulmonary emboli P:   Dilaudid for comfort > titrate for normal respiratory rate.  Increase ceiling to '15mg'$  O2 for comfort No further CXR's  CARDIOVASCULAR A:  Hypotension - due to meds (versed)  & vent, resolved TV endocarditis with severe TR P:  Full comfort care No further work up for endocarditis  DNR / DNI  RENAL A:   AKI - Low FeNa s/o prerenal but progressed to ATN, appears to have plateaued 2/16, improving daily Hyperkalemia, resolved Non-AG Metabolic acidosis, suspect component hyperchloremia P:  No further lab draws   GASTROINTESTINAL A:   Anemia of critical illness Doubt UGIB - feel aspirated blood P:   PRN ice chips / palliative feeding   HEMATOLOGIC A:   ABLA + anemia of critical illness. Pulmonary embolism P:  No further lab draws No further anticoagulation  INFECTIOUS A:   Serratia and MRSA bacteremia secondary to IVDA TV endocarditis > progressed on TTE this admission MRSA bacteremia, MSSA multifocal cavitary pneumonia.  Leucocytosis P:   No further abx Full comfort care  ENDOCRINE A:   Hypoglycemia, resolved P:   D/C SSI, CBG's  NEUROLOGIC A:   Acute metabolic encephalopahty Polysubstance abuse P:   Dilaudid gtt for pain  Ativan for anxiety     Global: not a candidate for SNF placement as she has left SNF AMA. Palliative re-consulted 2/13, met w aunt 2/20.  Aunt is only family involved with her and has been a strong advocate for the patient.  She agrees for full comfort and does not want [place] to suffer.  Full DNR/DNI with comfort measures only.  No further abx or lab draws.  Dilaudid / Ativan gtt for comfort.  May ultimately need discharge to a hospice home but family would prefer for her to pass in house.     Transfer to [facility] 2/23 for primary SVC for comfort measures.  Once comfortable on ativan gtt, will transfer to palliative care floor this afternoon.  Likely she will be a prolonged transition to death.   Noe Gens, NP-C [facility] Pgr: [phone] or if no answer [phone] 05/26/2016, 9:12 AM   Attending Note:  I have examined patient, reviewed labs, studies and notes. I have discussed the case with B Ollis, and I agree with the data and plans as amended above. Ms. Faraone continues to be awake, uncomfortable, after being transitioned to a Dilaudid drip on 2/22. At this point all agree that our goals should be to maintain her comfort and her dignity. We will increase the ceiling on her Dilantin  drip to 15 mg per hour. We will also add anxiolytics. Antibiotics and other non-comfort-based meds have been stopped. She will move to a floor bed where we will continue to concentrate on comfort care.  Baltazar Apo, MD, PhD 05/29/2016, 4:17 PM [facility] [phone] or if no answer [phone]

## 2016-05-27 DIAGNOSIS — N170 Acute kidney failure with tubular necrosis: Secondary | ICD-10-CM

## 2016-05-27 DIAGNOSIS — R58 Hemorrhage, not elsewhere classified: Secondary | ICD-10-CM

## 2016-05-27 MED ORDER — CHLORHEXIDINE GLUCONATE CLOTH 2 % EX PADS
6.0000 | MEDICATED_PAD | Freq: Every day | CUTANEOUS | Status: DC
  Administered 2016-05-27: 6 via TOPICAL

## 2016-05-27 NOTE — Progress Notes (Signed)
Chaplain responded to page that family desire prayer for pt who is expecting a hospital death.  Upon entering room, chaplain encounters pt's aunt who is pt's NOK.  Pt's cousin also present at bedside.  Pt is lying in bed, eyes open and it appears pt is focused on something on the other side of room.  Family shares that pt has been looking at something and talking to it from time to time.  Chaplain normalizes pt's behavior acknowledging that it is acceptable for family to affirm pt and engage as to what she might be seeing.  Family share that pt has been "fighting for a long time."  Chaplain remains present as family share stories and participate in pt's life reflection.  Family asks chaplain to pray, chaplain offers prayer at conclusion of visit.  Please contact chaplain if further support is needed.  Erroll LunaOvercash, Wilmer Berryhill A, Chaplain 05/27/16 1330    05/27/16 1245  Clinical Encounter Type  Visited With Patient and family together  Visit Type Initial;Spiritual support;Social support;Critical Care  Referral From Family;Nurse  Consult/Referral To Chaplain  Spiritual Encounters  Spiritual Needs Prayer;Emotional;Grief support  Stress Factors  Patient Stress Factors Exhausted;Family relationships;Health changes;Loss of control  Family Stress Factors Family relationships;Health changes;Loss of control

## 2016-05-27 NOTE — Progress Notes (Signed)
Daily Progress Note   Patient Name: Catherine Hernandez       Date: 05/27/2016 DOB: [birth year]  Age: 31 y.o. MRN#: [id number] Attending Physician: Narda Bonds, MD Primary Care Physician: No PCP Per Patient Admit Date: 05/07/2016  Reason for Consultation/Follow-up: Establishing goals of care and Psychosocial/spiritual support  Subjective:   Appears comfortable -limited prognosis, likely hours to days  -utilization of medication for symptom management detailed      Comfort and dignity are the focus of care   - Questions and concerns addressed.   Family encouraged to call with questions or concerns.  PMT will continue to support holistically.   Length of Stay: 21  Current Medications: Scheduled Meds:  . Chlorhexidine Gluconate Cloth  6 each Topical Q0600  . glycopyrrolate  0.4 mg Intravenous QID  . mouth rinse  15 mL Mouth Rinse QID  . sodium chloride flush  3 mL Intravenous Q12H    Continuous Infusions: . HYDROmorphone 14 mg/hr (05/27/16 1246)  . LORazepam (ATIVAN) infusion 6.5 mg/hr (05/27/16 1052)    PRN Meds: acetaminophen, bisacodyl, lip balm, [DISCONTINUED] ondansetron **OR** ondansetron (ZOFRAN) IV, sodium chloride flush  Physical Exam  Constitutional: She appears cachectic. She appears ill. She is sedated.  HENT:  Mouth/Throat: Abnormal dentition. Dental caries present.  Cardiovascular: Tachycardia present.   Neurological: Cranial nerve deficit:    Skin: Skin is warm and dry.            Vital Signs: BP 122/78 (BP Location: Left Arm)   Pulse (!) 123   Temp 97.8 F (36.6 C) (Axillary)   Resp 15   Ht 5\' 5"  (1.651 m)   Wt 60.2 kg (132 lb 11.5 oz)   SpO2 96%   BMI 22.09 kg/m  SpO2: SpO2: 96 % O2 Device: O2 Device: Nasal Cannula O2 Flow Rate: O2 Flow Rate  (L/min): 2 L/min  Intake/output summary:   Intake/Output Summary (Last 24 hours) at 05/27/16 1355 Last data filed at 05/27/16 1000  Gross per 24 hour  Intake           682.58 ml  Output              730 ml  Net           -47.42 ml   LBM: Last BM Date: 05/24/16 Baseline Weight:  Weight: 43.1 kg (95 lb) Most recent weight: Weight: 60.2 kg (132 lb 11.5 oz)         Flowsheet Rows   Flowsheet Row Most Recent Value  Intake Tab  Referral Department  Hospitalist  Unit at Time of Referral  Med/Surg Unit  Palliative Care Primary Diagnosis  Sepsis/Infectious Disease  Date Notified  05/10/16  Palliative Care Type  Return patient Palliative Care  Reason for referral  Clarify Goals of Care  Date of Admission  05/27/2016  Date first seen by Palliative Care  05/10/16  # of days Palliative referral response time  0 Day(s)  # of days IP prior to Palliative referral  4  Clinical Assessment  Palliative Performance Scale Score  40%  Pain Max last 24 hours  6  Pain Min Last 24 hours  5  Dyspnea Max Last 24 Hours  7  Dyspnea Min Last 24 hours  6  Nausea Max Last 24 Hours  1  Nausea Min Last 24 Hours  0  Anxiety Max Last 24 Hours  9  Anxiety Min Last 24 Hours  6  Psychosocial & Spiritual Assessment  Palliative Care Outcomes  Patient/Family meeting held?  Yes  Who was at the meeting?  patient who is decisional.   Palliative Care Outcomes  Clarified goals of care      Patient Active Problem List   Diagnosis Date Noted  . Agitation   . Pressure injury of skin 05/25/2016  . Acute respiratory failure with hypoxia (HCC)   . DNR (do not resuscitate)   . ARF (acute renal failure) (HCC)   . MRSA bacteremia 05/08/2016  . History of pulmonary embolism 05/11/2016  . Serratia infection 04/04/2016  . IVDU (intravenous drug user) 04/04/2016  . Tobacco abuse 04/03/2016  . Palliative care by specialist 03/24/2016  . Septic embolism (HCC)   . HCAP (healthcare-associated pneumonia) 03/15/2016  .  Symptomatic anemia 03/14/2016  . 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 Polysubstance abuse   . Enterococcal bacteremia 11/18/2015  . Staphylococcus aureus bacteremia 11/18/2015  . Normocytic anemia 11/18/2015  . Poor dentition 11/18/2015  . Protein-calorie malnutrition, severe (HCC) 11/18/2015  . Acute kidney injury (HCC)   . Opioid use disorder, severe, dependence (HCC) 11/17/2015  . Hyponatremia 11/17/2015  . Thrombocytopenia (HCC) 11/17/2015  . Cavitary lung disease     Palliative Care Assessment & Plan   Patient Profile:  31 y.o. 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 Patient found to have septic emboli on CT angiogram in December and was started on Xarelto for systemic anticoagulation.  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 Over last 6 mnths, she has been bacteremic with MSSA, serratia & MRSA this time.   Liberated from Ventilator     Dense psychosocial issues     Assessment/Plan    focus of care is comfort -continue Dilaudid gtt and titrate to comfort - [name] for secretions    Ativan gtt  for agitation -no further diagnostics, IV fluids or antibiotics; no life prolonging measures -Hospice facility if indicated    Code Status:  DNR-comfort is focus of care      Code Status Orders        Start     Ordered   05/04/2016 1947  Full code  Continuous  12/12/2016 1952    Code Status History    Date Active Date Inactive Code Status Order ID Comments User Context   04/03/2016  2:22 AM 04/09/2016  6:41 PM Full Code [id number]  Lorretta HarpXilin Niu, MD ED   03/20/2016 12:44 AM 03/28/2016  5:48 PM Full Code [id number]  Hillary BowJared M Gardner, DO ED   03/15/2016  5:24 PM 03/15/2016 11:20 PM Full Code [id number]  Tyrone Nineyan B Grunz, MD Inpatient   12/16/2015  9:06  AM 12/19/2015 11:22 PM Full Code [id number]  Ozella Rocksavid J Merrell, MD ED   11/17/2015  1:50 AM 12/02/2015  5:55 PM Full Code [id number]  Jonah BlueJennifer Yates, MD Inpatient       Prognosis:  Hours to days  Discharge Planning:  Expect hospital death  Care plan was discussed with bedside nurse    Thank you for allowing the Palliative Medicine Team to assist in the care of this patient. Will continue to support holistically   Time In: 8 Time Out: 8.25 Total Time 25 Prolonged Time Billed  no       Greater than 50%  of this time was spent counseling and coordinating care related to the above assessment and plan.  Rosalin HawkingZeba Hennesy Sobalvarro, MD [phone]  Please contact Palliative Medicine Team phone at [phone] for questions and concerns.

## 2016-05-27 NOTE — Progress Notes (Signed)
PROGRESS NOTE    Catherine Hernandez  ZOX:[id number] DOB: Oct 11, 1985 DOA: 22-May-2016 PCP: No PCP Per Patient  Brief Narrative: Catherine Hernandez is a 31 y.o. female with a medical history of recurrent IVDA, TV endocarditis with serratia and cavitary lung lesions-ongoing bacteremia for months from drug use   Assessment & Plan:   Principal Problem:   Endocarditis of tricuspid valve Active Problems:   Opioid use disorder, severe, dependence (HCC)   Hyponatremia   Thrombocytopenia (HCC)   Cavitary lung disease   Enterococcal bacteremia   Staphylococcus aureus bacteremia   Poor dentition   Acute kidney injury (HCC)   Protein-calorie malnutrition, severe (HCC)   Polysubstance abuse   Symptomatic anemia   HCAP (healthcare-associated pneumonia)   Septic embolism (HCC)   Tobacco abuse   Serratia infection   IVDU (intravenous drug user)   History of pulmonary embolism   MRSA bacteremia   ARF (acute renal failure) (HCC)   Acute respiratory failure with hypoxia (HCC)   DNR (do not resuscitate)   Pressure injury of skin   Agitation    Plan: Patient has been made comfort care. Continue current management -dilaudid gtt, titrated for comfort of breathing -ativan gtt -Abeytas for comfort -palliative care recommendations   DVT prophylaxis: Comfort care Code Status: DNR, full comfort Family Communication: None at bedside Disposition Plan: Anticipate hospital death   Consultants:   Palliative care medicine  PCCM  Procedures:  Echocardiogram (05/08/2016)    Antimicrobials:  Cefepime 2/3 (x1 dose)  Vancomycin 2/3 - 2/8  Daptomycin 2/8 - 2/12  Zyvox 2/12 >> 2/22    Subjective: Patient seems to want something to drink.   Objective: Vitals:   05/27/16 0600 05/27/16 0720 05/27/16 0730 05/27/16 0800  BP:  122/78    Pulse: (!) 111 (!) 123    Resp: 14 15    Temp:    98.5 F (36.9 C)  TempSrc:    Axillary  SpO2: (!) 78% (!) 85% 96%   Weight:      Height:         Intake/Output Summary (Last 24 hours) at 05/27/16 1318 Last data filed at 05/27/16 1000  Gross per 24 hour  Intake           682.58 ml  Output              730 ml  Net           -47.42 ml   Filed Weights   05/23/16 0443 05/24/16 0425 05/25/16 0432  Weight: 59.7 kg (131 lb 9.8 oz) 60.2 kg (132 lb 11.5 oz) 60.2 kg (132 lb 11.5 oz)    Examination:  General exam: Appears calm and comfortable  Respiratory system: Clear to auscultation. Respiratory effort normal. Cardiovascular system: S1 & S2 heard, Normal rate with regular rhythm. Gastrointestinal system: Abdomen is nondistended, soft and nontender.Normal bowel sounds heard. Central nervous system: Alert. Extremities: No edema. No calf tenderness Skin: No cyanosis. Rash on face with some macular lesions on hands    Data Reviewed: I have personally reviewed following labs and imaging studies  CBC:  Recent Labs Lab 05/21/16 0353 05/22/16 0422 05/23/16 0442 05/23/16 1712 05/24/16 0318  WBC 9.6 9.3 6.4 6.8 12.9*  HGB 7.7* 7.7* 6.9* 8.0* 9.1*  HCT 24.8* 24.9* 22.4* 25.5* 28.9*  MCV 90.2 90.2 90.7 87.3 90.0  PLT 166 154 128* 123* 129*   Basic Metabolic Panel:  Recent Labs Lab 05/21/16 0353 05/22/16 0422 05/23/16 0442 05/24/16 8119  NA 137 139 142 143  K 4.8 4.3 4.1 4.0  CL 116* 118* 120* 120*  CO2 15* 17* 17* 18*  GLUCOSE 130* 161* 135* 176*  BUN 51* 47* 46* 42*  CREATININE 2.29* 1.97* 1.65* 1.55*  CALCIUM 7.7* 7.7* 7.5* 7.8*  MG 1.8  --  2.0 1.9  PHOS  --   --  5.2*  --    GFR: Estimated Creatinine Clearance: 48.6 mL/min (by C-G formula based on SCr of 1.55 mg/dL (H)). Liver Function Tests: No results for input(s): AST, ALT, ALKPHOS, BILITOT, PROT, ALBUMIN in the last 168 hours. No results for input(s): LIPASE, AMYLASE in the last 168 hours. No results for input(s): AMMONIA in the last 168 hours. Coagulation Profile: No results for input(s): INR, PROTIME in the last 168 hours. Cardiac Enzymes: No  results for input(s): CKTOTAL, CKMB, CKMBINDEX, TROPONINI in the last 168 hours. BNP (last 3 results) No results for input(s): PROBNP in the last 8760 hours. HbA1C: No results for input(s): HGBA1C in the last 72 hours. CBG:  Recent Labs Lab 05/24/16 1658 05/24/16 1958 05/24/16 2348 05/25/16 0743 05/25/16 1118  GLUCAP 171* 113* 134* 142* 110*   Lipid Profile: No results for input(s): CHOL, HDL, LDLCALC, TRIG, CHOLHDL, LDLDIRECT in the last 72 hours. Thyroid Function Tests: No results for input(s): TSH, T4TOTAL, FREET4, T3FREE, THYROIDAB in the last 72 hours. Anemia Panel: No results for input(s): VITAMINB12, FOLATE, FERRITIN, TIBC, IRON, RETICCTPCT in the last 72 hours. Sepsis Labs: No results for input(s): PROCALCITON, LATICACIDVEN in the last 168 hours.  No results found for this or any previous visit (from the past 240 hour(s)).       Radiology Studies: No results found.      Scheduled Meds: . Chlorhexidine Gluconate Cloth  6 each Topical Q0600  . glycopyrrolate  0.4 mg Intravenous QID  . mouth rinse  15 mL Mouth Rinse QID  . sodium chloride flush  3 mL Intravenous Q12H   Continuous Infusions: . HYDROmorphone 14 mg/hr (05/27/16 1246)  . LORazepam (ATIVAN) infusion 6.5 mg/hr (05/27/16 1052)     LOS: 21 days     Jacquelin Hawkingalph Carlis Blanchard [facility] 05/27/2016, 1:18 PM Pager: [phone]  If 7PM-7AM, please contact night-coverage [email or web address] Password [email or web address] 05/27/2016, 1:18 PM

## 2016-05-28 DIAGNOSIS — R042 Hemoptysis: Secondary | ICD-10-CM

## 2016-05-28 DIAGNOSIS — R0602 Shortness of breath: Secondary | ICD-10-CM

## 2016-05-30 DIAGNOSIS — R042 Hemoptysis: Secondary | ICD-10-CM

## 2016-05-30 DIAGNOSIS — R0602 Shortness of breath: Secondary | ICD-10-CM

## 2016-05-30 DIAGNOSIS — J9601 Acute respiratory failure with hypoxia: Secondary | ICD-10-CM

## 2016-06-01 NOTE — Death Summary Note (Signed)
DEATH SUMMARY   Patient Details  Name: Catherine Hernandez MRN: [id number] DOB: 1985/09/18  Admission/Discharge Information   Admit Date:  2016-06-04  Date of Death: Date of Death: 2016/06/26  Time of Death: Time of Death: 0104  Length of Stay: 2022/09/21  Referring Physician: No PCP Per Patient   Reason(s) for Hospitalization  Endocarditis  Diagnoses  Preliminary cause of death: Respiratory failure (HCC) Secondary Diagnoses (including complications and co-morbidities):  Principal Problem:   Endocarditis of tricuspid valve Active Problems:   Opioid use disorder, severe, dependence (HCC)   Hyponatremia   Thrombocytopenia (HCC)   Cavitary lung disease   Enterococcal bacteremia   Staphylococcus aureus bacteremia   Poor dentition   Acute kidney injury (HCC)   Protein-calorie malnutrition, severe (HCC)   Polysubstance abuse   Symptomatic anemia   HCAP (healthcare-associated pneumonia)   Septic embolism (HCC)   Tobacco abuse   Serratia infection   IVDU (intravenous drug user)   History of pulmonary embolism   MRSA bacteremia   Acute renal failure with tubular necrosis (HCC)   Acute respiratory failure (HCC)   DNR (do not resuscitate)   Pressure injury of skin   Agitation   Brief Hospital Course (including significant findings, care, treatment, and services provided and events leading to death)  Catherine Hernandez is a 31 y.o. year old female with a history of IV drug abuse, endocarditis involving the tricuspid valve, not nonadherence with treatment plan. She presented with generalized aches, pain, cough and malaise. She was found to have patchy bilateral airspace opacities presenting multifocal pneumonia versus septic emboli versus asymmetric edema. Blood culture was significant for MRSA. Patient treated with antibiotics and palliative care was consulted secondary to poor prognosis for patient. She was intubated on the February 14th secondary to significant coughing spell which progressed  into massive hemoptysis and was managed by critical care team at this time. On intubated she was managed with fentanyl and Precedex. She was extubated on February 22nd and started on Dilaudid drip. She is also started on Ativan drip. Goals of care discussions were made and elected to go for full comfort. Patient transferred back to hospitalists service and patient kept out of pain and discomfort. Medication titrated for normal respiratory rate.    Pertinent Labs and Studies  Significant Diagnostic Studies Dg Chest 2 View  Result Date: 04-Jun-2016 CLINICAL DATA:  Per EMS- Patient reports that she has been sleeping on the ground x 2 days outside of a hotel. Patient also c/o falling 2 days ago and is having right shoulder, neck pain and generalized body aches; main pain in right shoulder; hx PNA; smoker; EXAM: CHEST  2 VIEW COMPARISON:  Chest x-ray dated 04/09/2016 FINDINGS: Heart size and mediastinal contours are normal. Patchy bilateral airspace opacities. No pleural effusion or pneumothorax seen. Osseous and soft tissue structures about the chest are unremarkable. IMPRESSION: Patchy bilateral airspace opacities, most likely multifocal pneumonia. Differential would also include septic emboli and asymmetric edema. Electronically Signed   By: Bary Richard M.D.   On: 04-Jun-2016 18:01   Dg Lumbar Spine Complete  Result Date: 2016/06/04 CLINICAL DATA:  Per EMS- Patient reports that she has been sleeping on the ground x 2 days outside of a hotel. Patient also c/o falling 2 days ago and is having right shoulder, neck pain and generalized body aches; main pain in right shoulder; hx PNA; smoker; EXAM: LUMBAR SPINE - COMPLETE 4+ VIEW COMPARISON:  None. FINDINGS: There is no evidence of lumbar spine  fracture. Alignment is normal. Intervertebral disc spaces are maintained. Mildly prominent gas-filled loops of small bowel within the upper pelvis, of uncertain significance. IMPRESSION: 1. No bony abnormality. 2. Mildly  prominent gas-filled loops of small bowel within the upper pelvis, of uncertain significance, incompletely imaged. Consider dedicated plain film of the abdomen for further characterization. Electronically Signed   By: Bary Richard M.D.   On: 05-22-2016 18:03   Dg Shoulder Right  Result Date: 05/22/2016 CLINICAL DATA:  Per EMS- Patient reports that she has been sleeping on the ground x 2 days outside of a hotel. Patient also c/o falling 2 days ago and is having right shoulder, neck pain and generalized body aches; main pain in right shoulder; hx PNA; smoker; EXAM: RIGHT SHOULDER - 2+ VIEW COMPARISON:  None. FINDINGS: There is no evidence of fracture or dislocation. There is no evidence of arthropathy or other focal bone abnormality. Patchy infiltrates are identified within the visualized portion of the lung apices. IMPRESSION: No evidence for acute fracture or subluxation of the right shoulder. Bilateral pulmonary infiltrates. Electronically Signed   By: Norva Pavlov M.D.   On: 05-22-16 18:00   Ct Chest Wo Contrast  Result Date: 05/22/2016 CLINICAL DATA:  Septic emboli versus TB.  Heroin abuse. EXAM: CT CHEST WITHOUT CONTRAST TECHNIQUE: Multidetector CT imaging of the chest was performed following the standard protocol without IV contrast. COMPARISON:  Chest CT dated 03/22/2016. FINDINGS: Cardiovascular: Heart size is normal. No pericardial effusion seen. Thoracic aorta appears grossly normal in caliber and configuration. Mediastinum/Nodes: Several moderate-sized lymph nodes are seen within the anterior mediastinum and right paratracheal region, largest measuring approximately 1.5 cm short axis dimension. Enlarged lymph nodes also noted within the left hilum, measuring up to 1.6 cm short axis dimension. Trachea and central bronchi are unremarkable. Lungs/Pleura: Scattered consolidations again noted throughout both lungs, some of which are new, largest within the right upper lobe measuring 3.4 cm greatest  dimension and within the lingula measuring approximately 3.2 cm. Many of the consolidations again demonstrate central cavitation. Upper Abdomen: Probable splenomegaly Musculoskeletal: No acute or suspicious osseous finding. IMPRESSION: 1. Scattered consolidations throughout both lungs, some new compared to the earlier chest CT, majority located peripherally, many with central cavitation. As suggested on the earlier CT report, findings are compatible with septic emboli. Given the upper lobe cavitary consolidations and mediastinal lymphadenopathy, tuberculosis cannot be excluded. Differential would also include atypical pneumonias such as fungal and viral. 2. Mediastinal lymphadenopathy, as detailed above. 3. Probable splenomegaly. Electronically Signed   By: Bary Richard M.D.   On: 05-22-16 20:42   Ct Cervical Spine Wo Contrast  Result Date: 05-22-16 CLINICAL DATA:  Pain after fall EXAM: CT CERVICAL SPINE WITHOUT CONTRAST TECHNIQUE: Multidetector CT imaging of the cervical spine was performed without intravenous contrast. Multiplanar CT image reconstructions were also generated. COMPARISON:  None. FINDINGS: Alignment: Normal. Skull base and vertebrae: No acute fracture. No primary bone lesion or focal pathologic process. Soft tissues and spinal canal: No prevertebral fluid or swelling. No visible canal hematoma. Disc levels:  No degenerative disc disease. Upper chest: Opacities, some of which are nodular, in the lung apices. Several of these opacities and nodules demonstrate cavitation. Other: No other abnormalities. IMPRESSION: 1. Opacities and nodules in the apices with cavitation. Given the history of heroin abuse, this could represent septic emboli. However, atypical infections including tuberculosis should be considered. 2. No fracture or malalignment in the cervical spine. Findings called to the patient's PA, Buel Ream Electronically Signed  By: Gerome Samavid  Williams III M.D   On: 06/05/16 17:27   [place]  Renal  Result Date: 05/07/2016 CLINICAL DATA:  31 year old female with acute renal failure. Septic pulmonary emboli. Heroin abuse. Initial encounter. EXAM: RENAL / URINARY TRACT ULTRASOUND COMPLETE COMPARISON:  Chest CT without contrast 06/05/16. Renal ultrasound 11/17/2015 and earlier. FINDINGS: Right Kidney: Length: 13.7 cm (13.8 cm previously). I so echogenic to liver as on the August ultrasound. No hydronephrosis or right renal mass. Left Kidney: Length: 13.8 cm (previously 13.9 cm). Stable with similar echogenicity to the right kidney. No hydronephrosis or left renal mass. Bladder: Appears normal for degree of bladder distention. IMPRESSION: No acute renal findings. Mildly increased bilateral renal cortical echogenicity compatible with chronic medical renal disease. Electronically Signed   By: Odessa FlemingH  Hall M.D.   On: 05/07/2016 10:44   Dg Chest Port 1 View  Result Date: 05/23/2016 CLINICAL DATA:  Respiratory failure. EXAM: PORTABLE CHEST 1 VIEW COMPARISON:  05/21/2016 .  05/20/2016.  CT 06/05/16. FINDINGS: Endotracheal tube, NG tube, right IJ line stable position. Stable cardiomegaly. Persistent multifocal pulmonary infiltrates. Cavitation in the right base. No change from prior exam. Mild colonic distention. IMPRESSION: 1. Lines and tubes in stable position. 2. Persistent multifocal pulmonary infiltrates. Cavitation noted again in the right lung base. Low lung volumes. 3. Stable cardiomegaly. 4. Mild colonic distention . Electronically Signed   By: Maisie Fushomas  Register   On: 05/23/2016 07:04   Dg Chest Port 1 View  Result Date: 05/21/2016 CLINICAL DATA:  Acute respiratory failure EXAM: PORTABLE CHEST 1 VIEW COMPARISON:  Yesterday FINDINGS: Endotracheal tube tip 1 cm above the carina. Right IJ central line with tip at the right atrium, positioning accentuated by low volumes. Bilateral patchy airspace disease, densely confluent in the right middle and lower lobes. Probable small right pleural effusion.  Cavitary features are noted. No pneumothorax. IMPRESSION: 1. Endotracheal tube tip 1 cm above the carina. Otherwise, stable from yesterday. 2. Bilateral cavitary pneumonia. Dense consolidation at the right base where there may also be lobar collapse. Electronically Signed   By: Marnee SpringJonathon  Watts M.D.   On: 05/21/2016 06:53   Dg Chest Port 1 View  Result Date: 05/20/2016 CLINICAL DATA:  Initial evaluation for acute respiratory failure, pneumonia. EXAM: PORTABLE CHEST 1 VIEW COMPARISON:  Prior radiograph from 05/19/2016. FINDINGS: Patient remains intubated with the tip of an endotracheal tube positioned 2.5 cm above the carina. Enteric tube in appropriate position with tip in side hole overlying the stomach. Right IJ approach central venous catheter in stable position. Cardiac and mediastinal silhouettes are unchanged. Lungs hypoinflated. Multifocal infiltrates again seen involving the right upper lobe and bilateral perihilar regions as well as the lung bases, slightly more dense and appearance as compared to prior. No pneumothorax. Osseous structures unchanged. IMPRESSION: 1. Endotracheal tube in satisfactory position with tip position 2.5 cm above the carina. Remaining support apparatus also in satisfactory position. 2. Multifocal bilateral infiltrates compatible with pneumonia, slightly more dense in appearance as compared to previous. 3. Persistent right pleural effusion. Electronically Signed   By: Rise MuBenjamin  McClintock M.D.   On: 05/20/2016 06:35   Dg Chest Port 1 View  Result Date: 05/19/2016 CLINICAL DATA:  Intubation. EXAM: PORTABLE CHEST 1 VIEW COMPARISON:  05/18/2016. FINDINGS: Endotracheal tube noted with tip 2.6 cm above the carina. NG tube noted with tip below left hemidiaphragm. Right IJ line noted with tip projected over right atrium. Heart size stable. Persistent multifocal infiltrates. No prominent pleural effusion or pneumothorax. No acute bony  abnormality. IMPRESSION: 1. Lines and tubes as  above. 2. Persistent multifocal pulmonary infiltrates consistent with pneumonia. Small right pleural effusion. Electronically Signed   By: Maisie Fus  Register   On: 05/19/2016 07:33   Dg Chest Port 1 View  Result Date: 05/18/2016 CLINICAL DATA:  Hypoxia EXAM: PORTABLE CHEST 1 VIEW COMPARISON:  May 17, 2016 FINDINGS: Endotracheal tube tip is 3.4 cm above the carina. Central catheter tip is at the cavoatrial junction. Nasogastric tube tip and side port are below the diaphragm. No pneumothorax. There is persistent airspace consolidation in the right mid and lower lung zones with small right pleural effusion. A smaller area of consolidation is noted in the left base medially, new. There is also patchy opacity in the left lower lung zone more superiorly, stable. Ill-defined areas of opacity in both upper lobes are stable. Heart size and pulmonary vascularity are normal. No adenopathy. No bone lesions. IMPRESSION: Tube and catheter positions as described without pneumothorax. Findings most consistent with multifocal pneumonia. New opacity medial left base. Other opacities appear stable compared to 1 day prior. Right pleural effusion, stable. Stable cardiac silhouette. Electronically Signed   By: Bretta Bang III M.D.   On: 05/18/2016 07:06   Dg Chest Port 1 View  Result Date: 05/17/2016 CLINICAL DATA:  Placement of central line EXAM: PORTABLE CHEST 1 VIEW COMPARISON:  Chest radiograph 05/17/2016 at 5:09 a.m. FINDINGS: Endotracheal tube and nasogastric tube are unchanged. Right IJ approach central venous catheter tip overlies the right atrium. Cardiomediastinal contours are unchanged. Multiple bilateral pulmonary opacities are also unchanged. There is slightly worsened aeration of the right lung base. Right pleural effusion. IMPRESSION: Right IJ central venous catheter tip overlying the right atrium. Otherwise little interval change. Electronically Signed   By: Deatra Robinson M.D.   On: 05/17/2016 19:26    Dg Chest Port 1 View  Result Date: 05/17/2016 CLINICAL DATA:  Check endotracheal tube placement EXAM: PORTABLE CHEST 1 VIEW COMPARISON:  05/17/2016 FINDINGS: Endotracheal tube is been withdrawn and now lies 4.3 cm above the carina in satisfactory position. Nasogastric catheter remains in place. Cardiac shadow is stable. Bilateral infiltrative changes are again seen with some cavitary changes bilaterally particularly in the upper lobes. Small right pleural effusion is again seen. IMPRESSION: Interval endotracheal tube repositioning. The remainder of the exam is stable from the prior study. Electronically Signed   By: Alcide Clever M.D.   On: 05/17/2016 07:15   Dg Chest Port 1 View  Result Date: 05/17/2016 CLINICAL DATA:  Cough and hemoptysis. Intubated. Post endotracheal and gastric tube placement. EXAM: PORTABLE CHEST 1 VIEW COMPARISON:  05/15/2016, chest CT 05/15/2016 FINDINGS: Low-lying endotracheal tube tip at approximately 1.4 cm above the carina. Pullback at least 1.6 cm recommended. Heart is borderline enlarged. No aortic aneurysm. Patchy airspace opacities are again noted bilaterally with possible air bronchograms in the right lower and cavitary consolidations in the upper lobes. Gastric tube extends into the abdomen, projecting over the right lower quadrant possibly within a J-shaped stomach. There appears be hepatosplenomegaly. IMPRESSION: 1. Low-lying endotracheal tube tip at 1.4 cm above the carina. Pullback at least 1.6 cm for a low-normal distance of 3 cm above the carina. 2. Patchy cavitary pulmonary consolidations in the upper lobes with air bronchograms in right lower lobe pulmonary consolidation. 3. Gastric tube extends into the stomach. 4. Prominent hepatic and splenic shadows suggesting hepatosplenomegaly. Electronically Signed   By: Tollie Eth M.D.   On: 05/17/2016 02:12   Dg Chest [facility] 1 [address]  Result Date: 05/15/2016 CLINICAL DATA:  Pt with significant for drug abuse with heroin,  cocaine, tobacco, asthma endocarditis involving tricuspid valve and nonadherence with her treatment plan now presenting to the emergency department with generalized aches and pains. EXAM: PORTABLE CHEST 1 VIEW COMPARISON:  CT chest 06-04-2016 FINDINGS: Patchy areas of airspace disease involving bilateral upper and lower lobes in a peripheral distribution with cavitation in the right upper lobe and right lower lobe opacities consistent with septic emboli. Small right pleural effusion. No pneumothorax. Stable cardiomediastinal silhouette. Right peritracheal lymphadenopathy. No acute osseous abnormality. IMPRESSION: 1. Worsening patchy areas of airspace disease involving bilateral upper and lower lobes in a peripheral distribution with cavitation in the right upper lobe and right lower lobe opacities consistent with septic emboli. Electronically Signed   By: Elige Ko   On: 05/15/2016 12:40   Dg Hip Unilat W Or Wo Pelvis 2-3 Views Right  Result Date: June 04, 2016 CLINICAL DATA:  Pain.  Fall 2 days ago. EXAM: DG HIP (WITH OR WITHOUT PELVIS) 2-3V RIGHT COMPARISON:  None. FINDINGS: There is no evidence of hip fracture or dislocation. There is no evidence of arthropathy or other focal bone abnormality. IMPRESSION: Negative. Electronically Signed   By: Gerome Sam III M.D   On: 06-04-2016 17:59    Microbiology No results found for this or any previous visit (from the past 240 hour(s)).  Lab Basic Metabolic Panel:  Recent Labs Lab 05/24/16 0318  NA 143  K 4.0  CL 120*  CO2 18*  GLUCOSE 176*  BUN 42*  CREATININE 1.55*  CALCIUM 7.8*  MG 1.9   Liver Function Tests: No results for input(s): AST, ALT, ALKPHOS, BILITOT, PROT, ALBUMIN in the last 168 hours. No results for input(s): LIPASE, AMYLASE in the last 168 hours. No results for input(s): AMMONIA in the last 168 hours. CBC:  Recent Labs Lab 05/23/16 1712 05/24/16 0318  WBC 6.8 12.9*  HGB 8.0* 9.1*  HCT 25.5* 28.9*  MCV 87.3 90.0    PLT 123* 129*   Cardiac Enzymes: No results for input(s): CKTOTAL, CKMB, CKMBINDEX, TROPONINI in the last 168 hours. Sepsis Labs:  Recent Labs Lab 05/23/16 1712 05/24/16 0318  WBC 6.8 12.9*    Procedures/Operations   Endotracheal tube (2/14 >> 2/22)   Jacquelin Hawking, MD 05/30/2016, 3:54 PM

## 2016-06-01 NOTE — Progress Notes (Signed)
Patient expired at 0104 05/27/2016 2 RNs verified - Donella Stadeobert Marsh Heckler, RN / Oren BeckmannGenell Garland, RN Family notified Belongings sent to morgue.

## 2016-06-01 DEATH — deceased

## 2016-06-22 LAB — ACID FAST CULTURE WITH REFLEXED SENSITIVITIES

## 2016-06-22 LAB — ACID FAST CULTURE WITH REFLEXED SENSITIVITIES (MYCOBACTERIA): Acid Fast Culture: NEGATIVE

## 2016-06-23 LAB — FUNGUS CULTURE WITH STAIN

## 2016-06-23 LAB — FUNGUS CULTURE RESULT

## 2016-06-23 LAB — FUNGAL ORGANISM REFLEX

## 2016-06-26 LAB — ACID FAST CULTURE WITH REFLEXED SENSITIVITIES (MYCOBACTERIA): Acid Fast Culture: NEGATIVE

## 2016-07-03 LAB — ACID FAST CULTURE WITH REFLEXED SENSITIVITIES: ACID FAST CULTURE - AFSCU3: NEGATIVE

## 2018-05-19 IMAGING — CR DG CHEST 2V
2 series · 2 of 2 positions shown · non-contrast
Comparison: Two-view chest x-ray 04/10/2013.

CLINICAL DATA: Intermittent moderate cough beginning 2 days ago.
Shortness breath. Moderate back and rib pain. Next healed

EXAM:
CHEST  2 VIEW

[w chest pa]
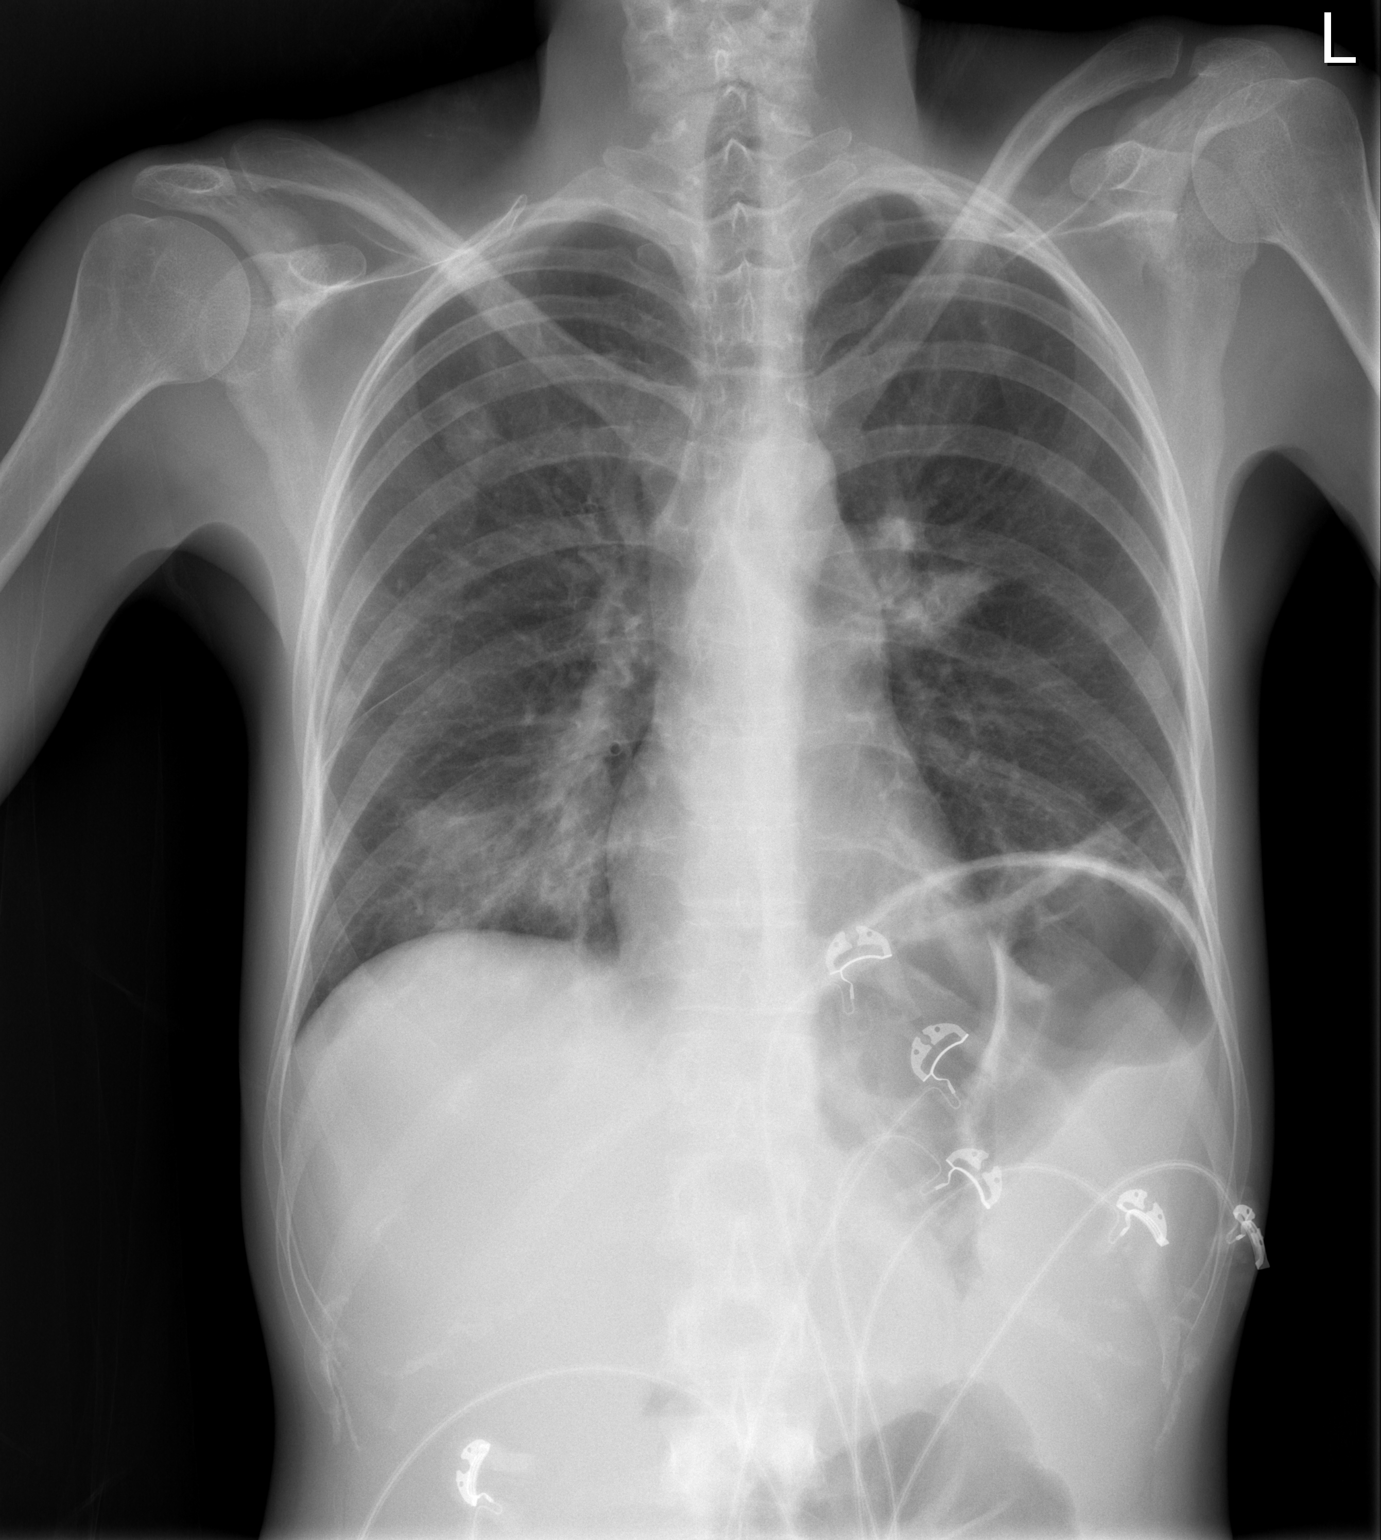

[w chest lat]
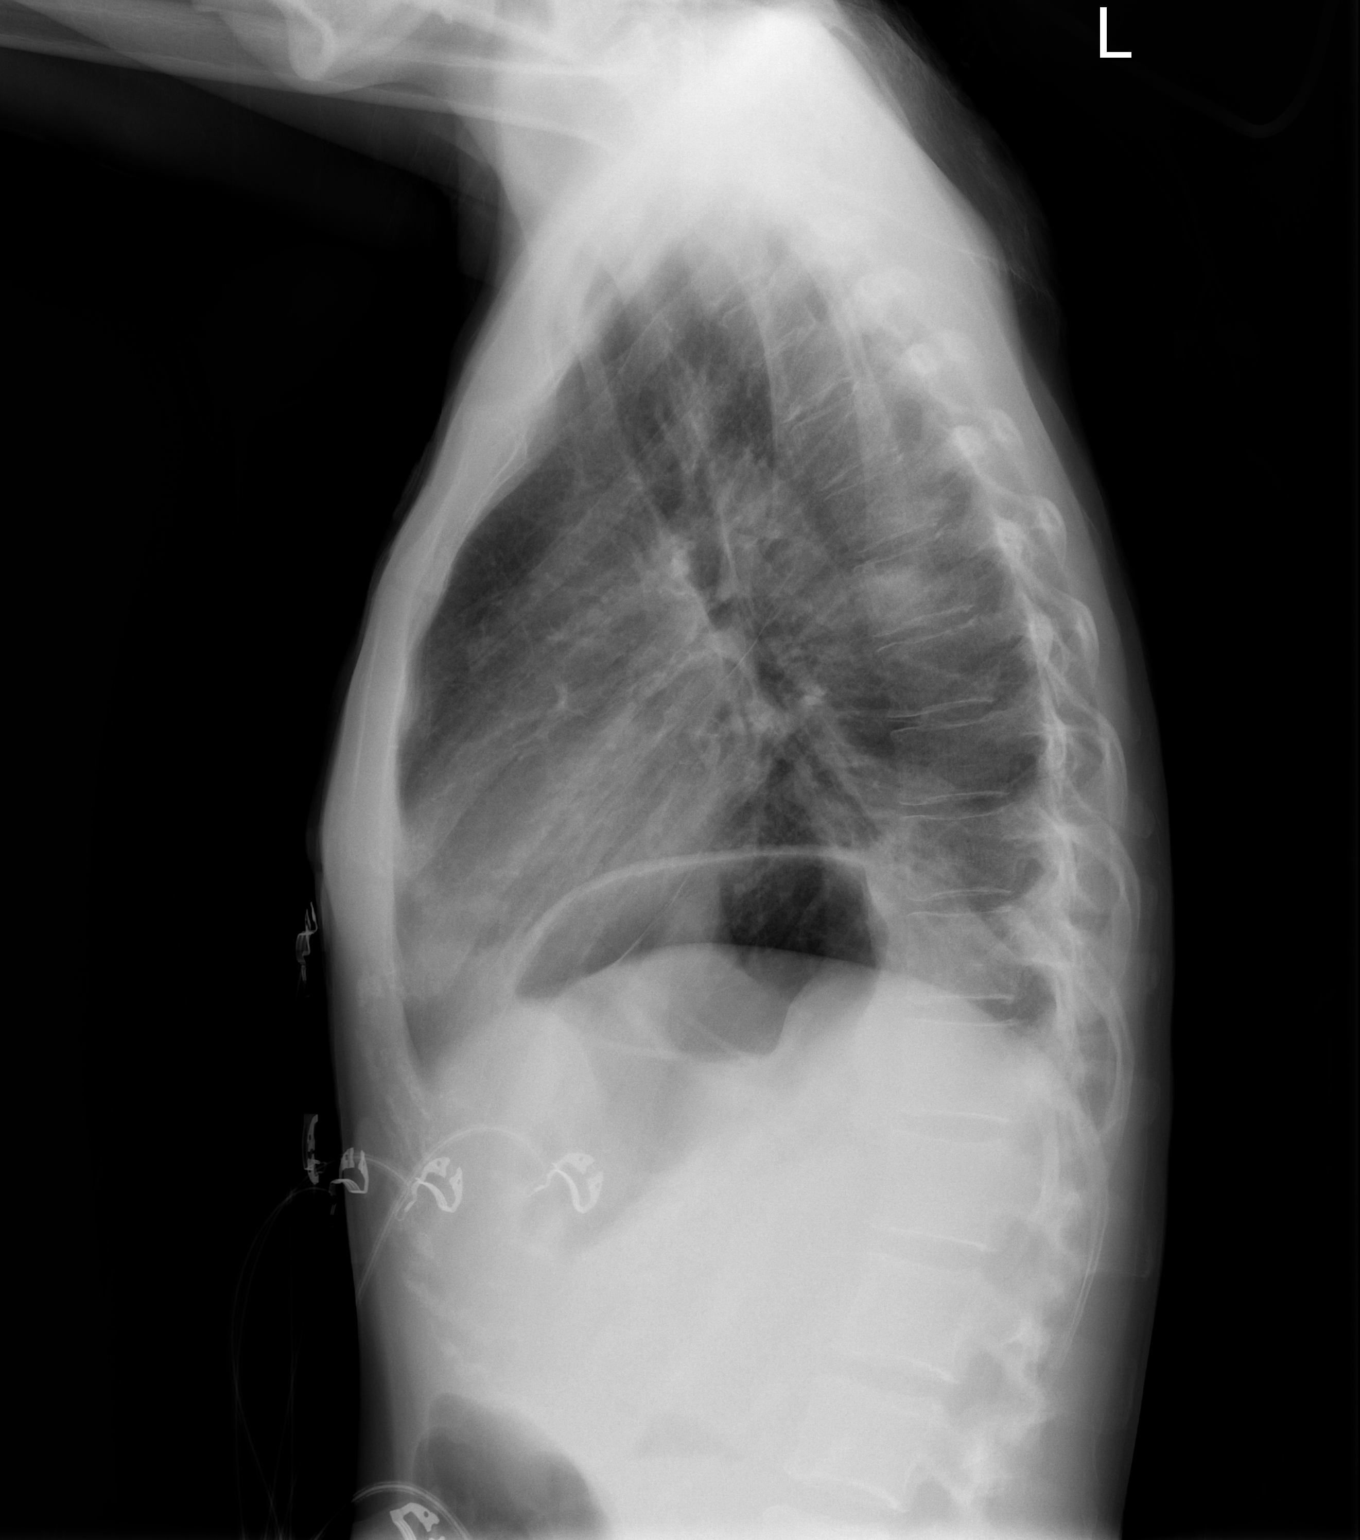

[2 of 2 positions shown; findings below may reference images not displayed]

FINDINGS: Multi focal right lower lobe airspace disease is present. Additional
right upper lobe airspace disease present. There is prominence of
the hila bilaterally. Linear densities are noted at the left base.

The heart size is normal. The visualized soft tissues and bony
thorax are unremarkable.
IMPRESSION: 1. Multi focal pneumonia involving the right lower lobe, right upper
lobe, and possibly left lower lobe.
2. Moderate hilar prominence likely representing vascular congestion
neck and possible reactive adenopathy.
3. Recommend follow-up two-view chest x-ray following appropriate
treatment therapy.

## 2018-10-05 IMAGING — CR DG CHEST 2V
2 series · 2 of 2 positions shown · non-contrast
Comparison: CT chest March 22, 2016

CLINICAL DATA: Chest pain, cough for 2 days. History of pulmonary
emboli, polysubstance abuse, pneumonia.

EXAM:
CHEST  2 VIEW

[chest lat]
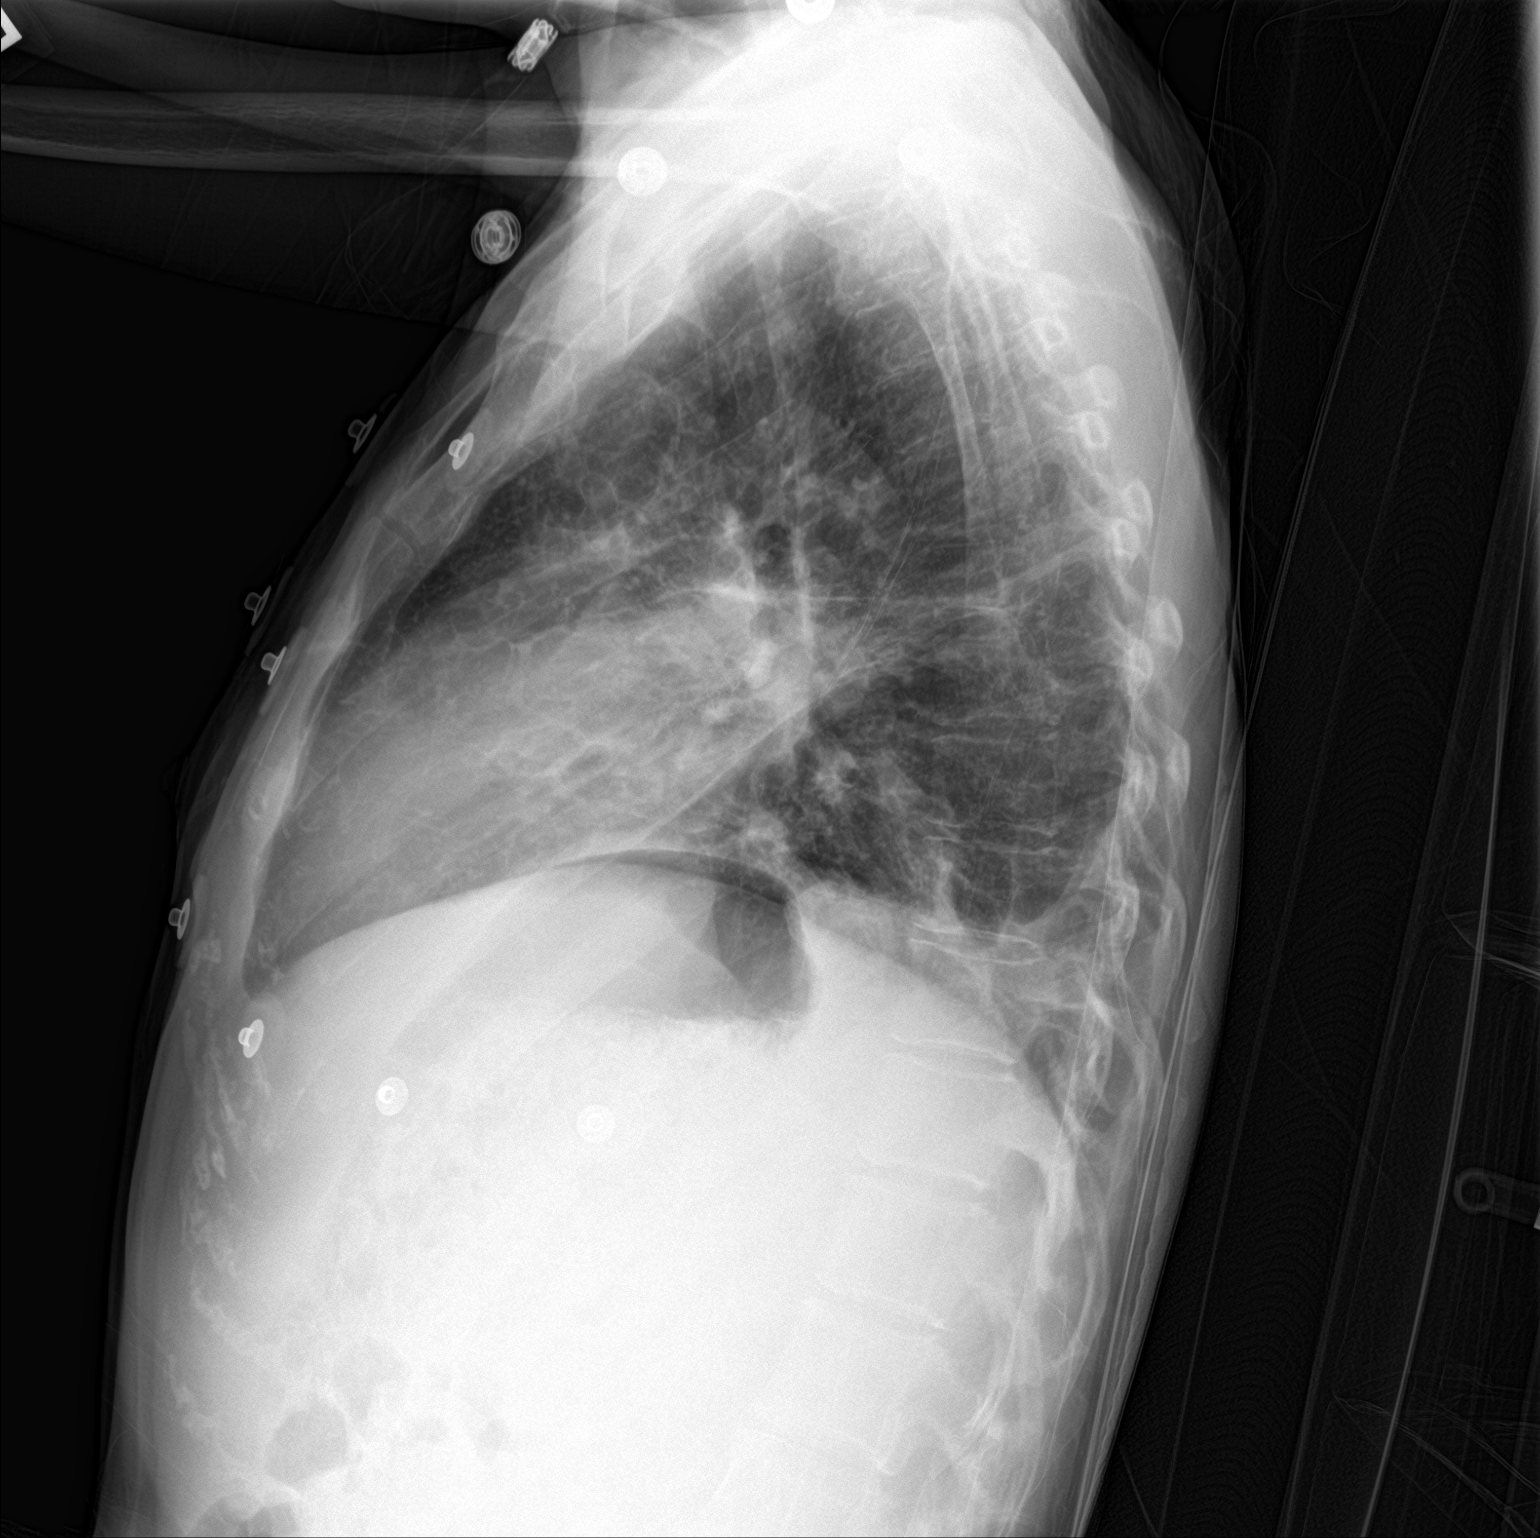

[chest ap]
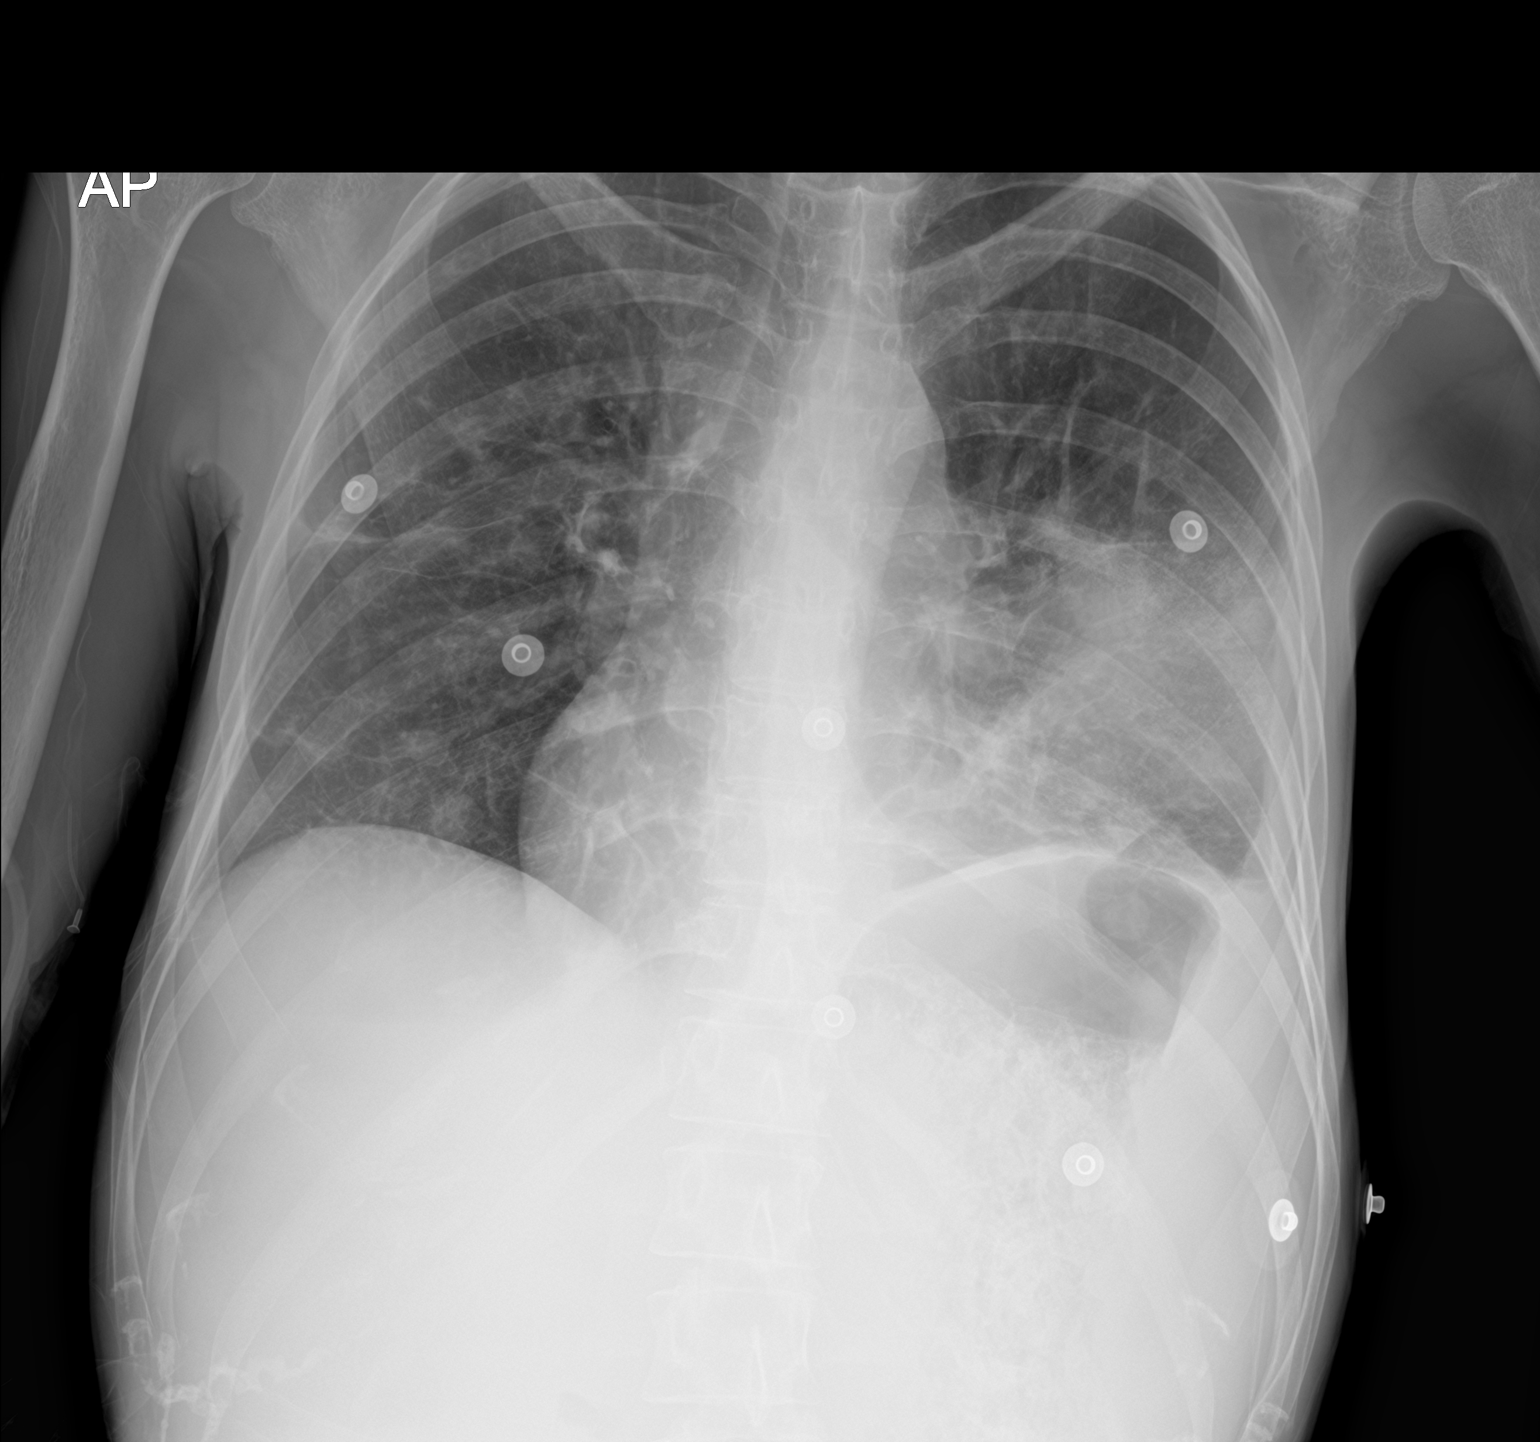

[2 of 2 positions shown; findings below may reference images not displayed]

FINDINGS: Cardiac silhouette is mildly enlarged unchanged. Dense consolidation
LEFT mid and lower lung zone with small LEFT pleural effusion.
Patchy consolidation RIGHT lung. No pneumothorax. Soft tissue planes
and included osseous structures are nonsuspicious.
IMPRESSION: Probable worsening of multifocal pneumonia in a background of known
septic emboli.

Stable mild cardiomegaly.

## 2018-10-11 IMAGING — CR DG CHEST 2V
2 series · 2 of 2 positions shown · non-contrast
Comparison: 04/03/2016

CLINICAL DATA: Chest pain tonight.  History of heroin abuse.

EXAM:
CHEST  2 VIEW

[chest pa]
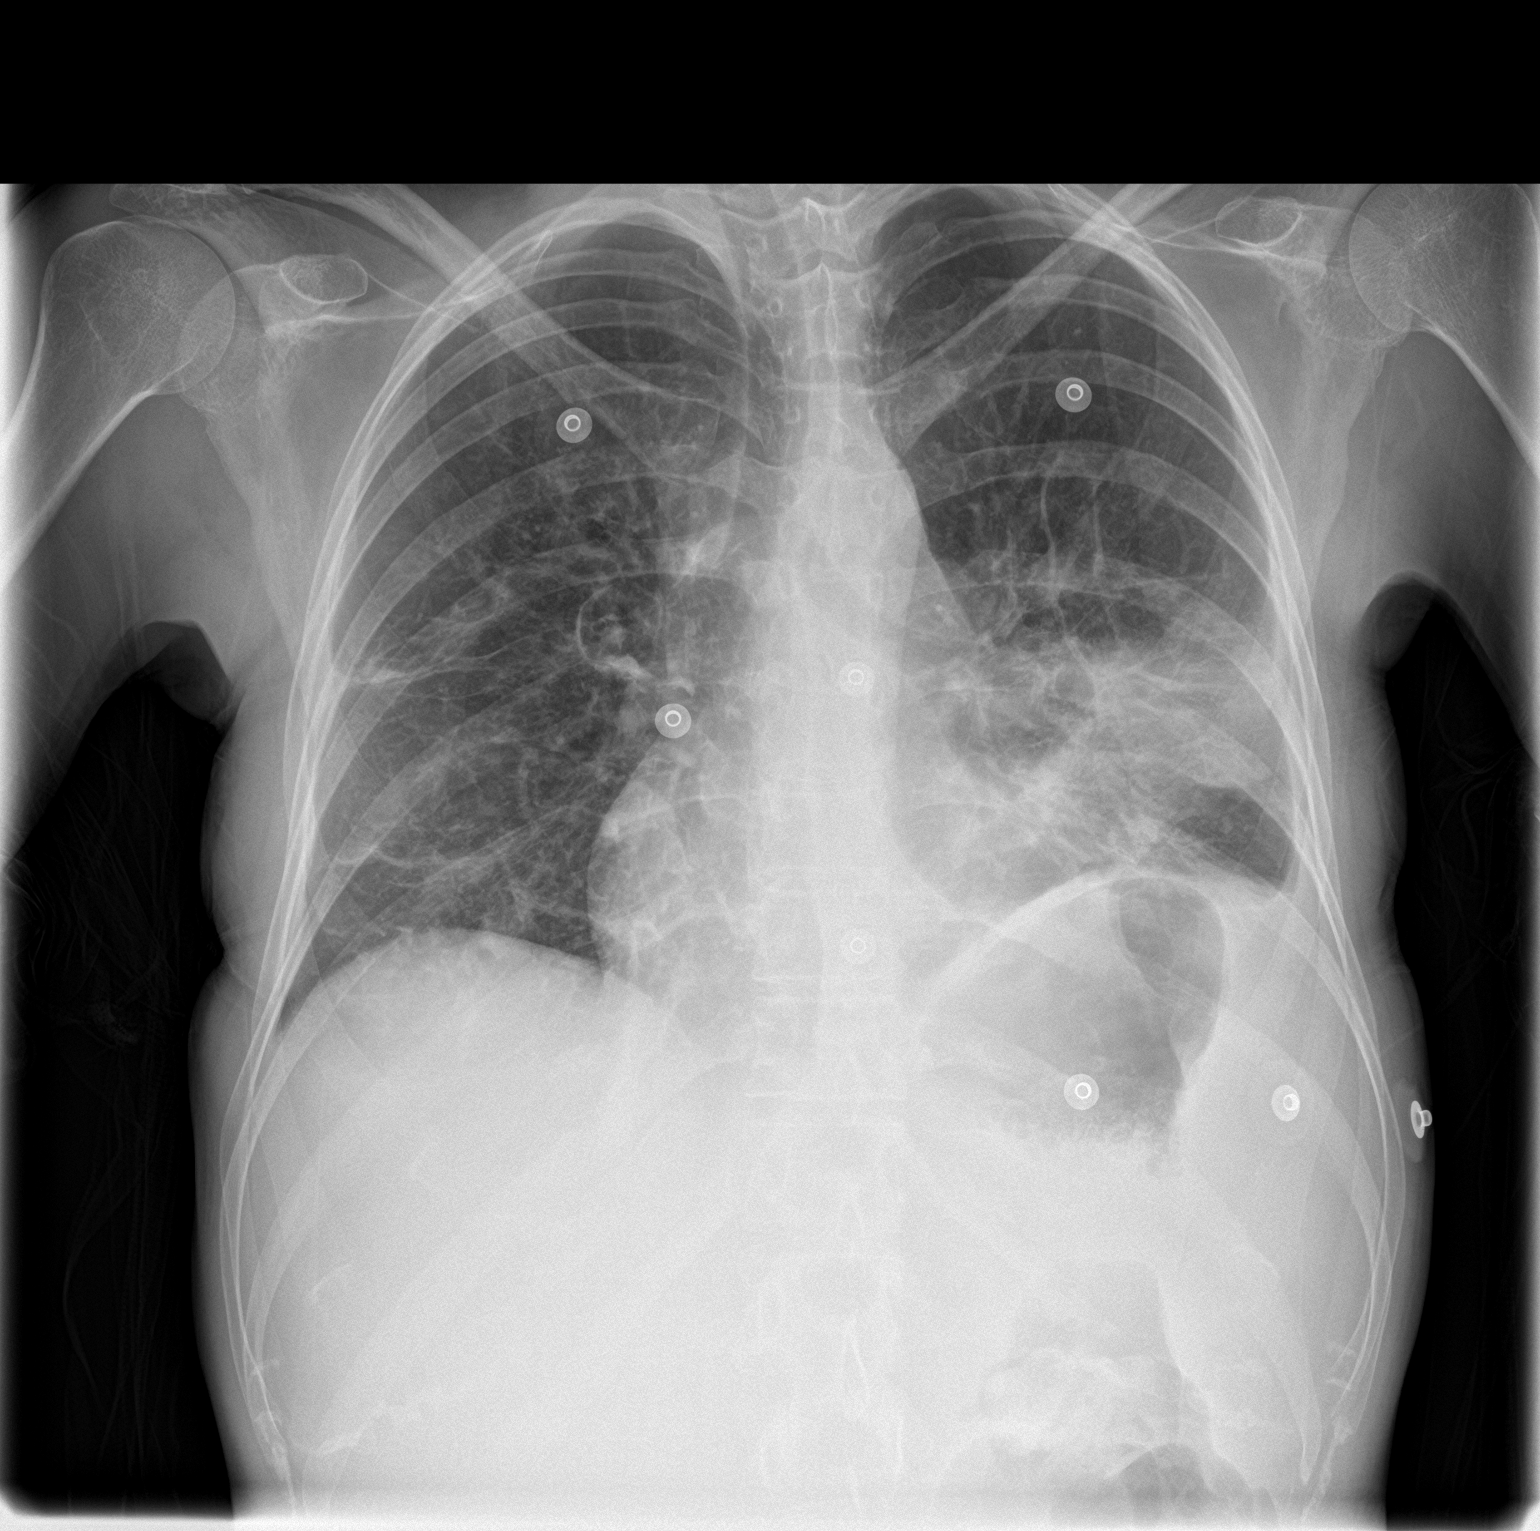

[chest lat]
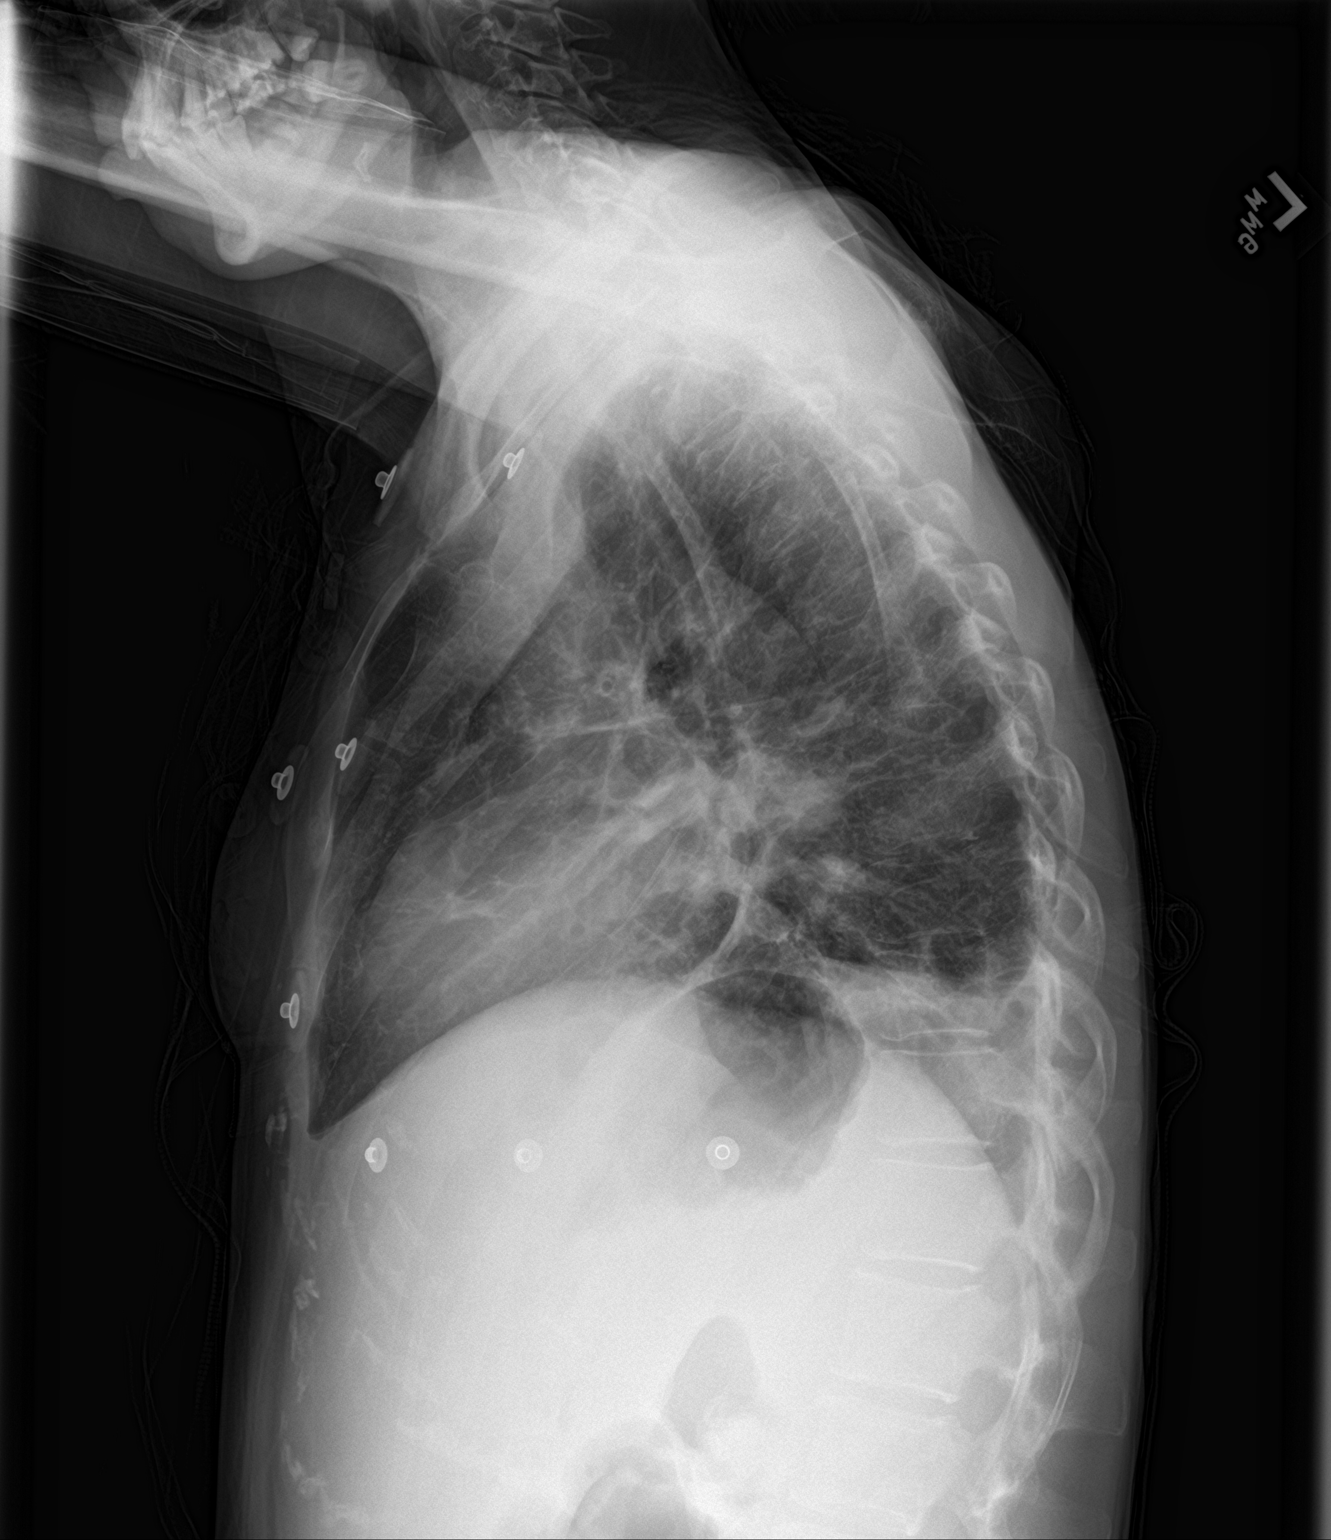

[2 of 2 positions shown; findings below may reference images not displayed]

FINDINGS: Shallow inspiration with elevation of left hemidiaphragm. Focal
consolidation in the mid and lower left lung zone is improved since
previous study but remains present. Follow-up to resolution is
recommended. Small left pleural effusion. Linear fibrosis or
atelectasis in the right mid lung and lower lung. Nodular changes
seen previously in the right lung have mostly resolved.
Peribronchial thickening and central streaky opacities likely
indicate bronchitis. Heart size and pulmonary vascularity are
normal. Spleen size appears mildly enlarged.
IMPRESSION: Left lower lung consolidation with small left pleural effusion,
improving since previous study. Follow-up to resolution recommended.
Focal areas of linear scarring or atelectasis in the right mid lung.
Probable bronchitic changes.

## 2023-10-24 NOTE — Telephone Encounter (Signed)
 error
